# Patient Record
Sex: Female | Born: 1965 | Race: Black or African American | Hispanic: No | Marital: Married
Health system: Southern US, Community
[De-identification: ages and names within clinical notes are randomized; demographics above are authoritative.]

## PROBLEM LIST (undated history)

## (undated) ENCOUNTER — Emergency Department (HOSPITAL_COMMUNITY): Payer: No Typology Code available for payment source

## (undated) DIAGNOSIS — R232 Flushing: Secondary | ICD-10-CM

## (undated) DIAGNOSIS — F419 Anxiety disorder, unspecified: Secondary | ICD-10-CM

## (undated) DIAGNOSIS — Z8489 Family history of other specified conditions: Secondary | ICD-10-CM

## (undated) DIAGNOSIS — G709 Myoneural disorder, unspecified: Secondary | ICD-10-CM

## (undated) DIAGNOSIS — I10 Essential (primary) hypertension: Secondary | ICD-10-CM

## (undated) DIAGNOSIS — F32A Depression, unspecified: Secondary | ICD-10-CM

## (undated) DIAGNOSIS — E039 Hypothyroidism, unspecified: Secondary | ICD-10-CM

## (undated) DIAGNOSIS — Z1379 Encounter for other screening for genetic and chromosomal anomalies: Principal | ICD-10-CM

## (undated) DIAGNOSIS — C50911 Malignant neoplasm of unspecified site of right female breast: Secondary | ICD-10-CM

## (undated) DIAGNOSIS — M1990 Unspecified osteoarthritis, unspecified site: Secondary | ICD-10-CM

## (undated) DIAGNOSIS — IMO0001 Reserved for inherently not codable concepts without codable children: Secondary | ICD-10-CM

## (undated) DIAGNOSIS — F329 Major depressive disorder, single episode, unspecified: Secondary | ICD-10-CM

## (undated) DIAGNOSIS — M549 Dorsalgia, unspecified: Secondary | ICD-10-CM

## (undated) DIAGNOSIS — Z1509 Genetic susceptibility to other malignant neoplasm: Secondary | ICD-10-CM

## (undated) DIAGNOSIS — C50511 Malignant neoplasm of lower-outer quadrant of right female breast: Principal | ICD-10-CM

## (undated) DIAGNOSIS — R51 Headache: Secondary | ICD-10-CM

## (undated) DIAGNOSIS — R519 Headache, unspecified: Secondary | ICD-10-CM

## (undated) DIAGNOSIS — C50919 Malignant neoplasm of unspecified site of unspecified female breast: Secondary | ICD-10-CM

## (undated) DIAGNOSIS — Z828 Family history of other disabilities and chronic diseases leading to disablement, not elsewhere classified: Secondary | ICD-10-CM

## (undated) DIAGNOSIS — C719 Malignant neoplasm of brain, unspecified: Secondary | ICD-10-CM

## (undated) DIAGNOSIS — IMO0002 Reserved for concepts with insufficient information to code with codable children: Secondary | ICD-10-CM

## (undated) HISTORY — DX: Anxiety disorder, unspecified: F41.9

## (undated) HISTORY — DX: Unspecified osteoarthritis, unspecified site: M19.90

## (undated) HISTORY — DX: Major depressive disorder, single episode, unspecified: F32.9

## (undated) HISTORY — DX: Essential (primary) hypertension: I10

## (undated) HISTORY — DX: Malignant neoplasm of lower-outer quadrant of right female breast: C50.511

## (undated) HISTORY — DX: Flushing: R23.2

## (undated) HISTORY — DX: Depression, unspecified: F32.A

## (undated) HISTORY — DX: Malignant neoplasm of unspecified site of unspecified female breast: C50.919

## (undated) HISTORY — PX: BREAST REDUCTION SURGERY: SHX8

## (undated) HISTORY — DX: Encounter for other screening for genetic and chromosomal anomalies: Z13.79

## (undated) HISTORY — DX: Genetic susceptibility to other malignant neoplasm: Z15.09

## (undated) HISTORY — DX: Dorsalgia, unspecified: M54.9

## (undated) NOTE — *Deleted (*Deleted)
Patient Care Team: Patient, [name] as PCP - General (General Practice) Kaitlyn Miner, MD as Consulting Physician (General Surgery) Kaitlyn Croissant, MD as Consulting Physician (Hematology and Oncology) Antony Blackbird, MD as Consulting Physician (Radiation Oncology) Pershing Proud, RN as Registered Nurse Donnelly Angelica, RN as Registered Nurse Hubbard Hartshorn, NP (Inactive) as Nurse Practitioner (Nurse Practitioner)  DIAGNOSIS: No diagnosis found.  SUMMARY OF ONCOLOGIC HISTORY: Oncology History  Breast cancer of lower-outer quadrant of right female breast (HCC)  12/03/2014 Mammogram   Right breast mass 1.9 cm it o'clock position 8 cm depth from the nipple   12/03/2014 Initial Diagnosis   Right breast biopsy 8:00: Invasive ductal carcinoma, grade 3, ER 0%, PR 0%, Ki-67 90%, HER-2 negative ratio 1.43   12/10/2014 Breast MRI   Right breast lower outer quadrant: 2.3 x 2.4 x 2.4 cm rim-enhancing mass abuts the pectoralis fascia but no enhancement of pectoralis muscle, second focus of artifact?'s second tissue marker clip, no lymph nodes   12/10/2014 Clinical Stage   Stage IIA: T2 N0   12/24/2014 - 04/29/2015 Neo-Adjuvant Chemotherapy   Dose dense Adriamycin and Cytoxan 4 followed by weekly Abraxane 12   05/02/2015 Breast MRI   complete radiologic response   06/16/2015 Surgery   Left Lumpectomy: Complete path Response, 0/2 LN   06/16/2015 Pathologic Stage   ypT0 ypN0   07/23/2015 - 09/05/2015 Radiation Therapy   Adjuvant RT: 50.4 Gy in 28 fractions and a boost of 10 Gy in 5 fractions to total dose of 60.4 Gy   10/24/2015 Survivorship   SCP mailed to patient in lieu of in person visit.   07/26/2016 - 07/27/2016 Radiation Therapy    SRS brain   07/27/2016 - 07/29/2016 Hospital Admission   Cerebellar mass: Right suboccipital craniotomy for tumor resection with stereotactic navigation: Metastatic poorly differentiated adenocarcinoma with extensive necrosis positive for CK 7, MOC 31, CK  5/6; Neg for Er/PR, GATA-3, GCDFP CDX2, Napsin A and TTF-1   11/17/2016 PET scan   Subcutaneous nodules in the neck, upper back, left arm, abdomen and pelvis,. 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 in BARD 1, BRCA2 and NF1   12/31/2016 Miscellaneous   Everolimus 10 mg daily for cycle 1 if she cannot tolerate will decrease to 7.5 mg daily   05/24/2017 - 07/04/2017 Chemotherapy   Xeloda 2000 mg 2 weeks on 1 week off stopped due to progression of disease based on CT scans done 06/27/2017   07/25/2017 - 03/13/2018 Chemotherapy   [place] days 1 and 8 every 3 weeks stopped for progression    03/20/2018 - 03/31/2018 Radiation Therapy   Radiation to lymph nodes   04/03/2018 -  Chemotherapy   Keytruda every 3 weeks      CHIEF COMPLIANT: Follow-up of metastatic breast cancer on Keytruda  INTERVAL HISTORY: Kaitlyn Keith is a 22 y.o. with above-mentioned history of metastatic breast cancer currently on treatment with Keytruda. CT CAP on 03/14/20 showed no evidence of local recurrence or metastatic disease. She presents to the clinic today for treatment.    ALLERGIES:  has No Known Allergies.  MEDICATIONS:  Current Outpatient Medications  Medication Sig Dispense Refill  . alprazolam (XANAX) 2 MG tablet Take 1 tablet (  2 mg total) by mouth at bedtime. 30 tablet 3  . betamethasone valerate ointment (VALISONE) 0.1 % Apply 1 application topically 2 (two) times daily. 30 g 1  . cyclobenzaprine (FLEXERIL) 5 MG tablet Take 1 tablet (5 mg total) by mouth 3 (three) times daily as needed for muscle spasms. 30 tablet 0  . ipratropium (ATROVENT) 0.06 % nasal spray Place 2 sprays  into both nostrils 4 (four) times daily.    Marland Kitchen levothyroxine (SYNTHROID) 125 MCG tablet TAKE 1 TABLET BY MOUTH EVERY DAY BEFORE BREAKFAST 90 tablet 0  . lidocaine-prilocaine (EMLA) cream APPLY TO AFFECTED AREA ONCE AS DIRECTED 30 g 3  . lisinopril-hydrochlorothiazide (ZESTORETIC) 20-12.5 MG tablet TAKE 1 TABLET BY MOUTH TWICE A DAY 180 tablet 3  . triamcinolone ointment (KENALOG) 0.5 % APPLY TO AFFECTED AREA TWICE A DAY (Patient not taking: Reported on 02/07/2020) 30 g 0   No current facility-administered medications for this visit.    PHYSICAL EXAMINATION: ECOG PERFORMANCE STATUS: {CHL ONC ECOG PS:[phone]}  There were no vitals filed for this visit. There were no vitals filed for this visit.  LABORATORY DATA:  I have reviewed the data as listed CMP Latest Ref Rng & Units 02/29/2020 02/11/2020 01/22/2020  Glucose 70 - 99 mg/dL 960(A) 540(J) 79  BUN 6 - 20 mg/dL 10 14 12   Creatinine 0.44 - 1.00 mg/dL 8.11 9.14 7.82  Sodium 135 - 145 mmol/L 139 139 140  Potassium 3.5 - 5.1 mmol/L 3.4(L) 3.5 3.4(L)  Chloride 98 - 111 mmol/L 100 106 107  CO2 22 - 32 mmol/L 27 25 27   Calcium 8.9 - 10.3 mg/dL 9.5(A) 2.1(H) 9.7  Total Protein 6.5 - 8.1 g/dL 7.2 7.1 6.7  Total Bilirubin 0.3 - 1.2 mg/dL 0.7 0.5 0.5  Alkaline Phos 38 - 126 U/L 92 97 84  AST 15 - 41 U/L 17 16 19   ALT 0 - 44 U/L 13 11 11     Lab Results  Component Value Date   WBC 4.3 02/29/2020   HGB 12.9 02/29/2020   HCT 38.9 02/29/2020   MCV 87.2 02/29/2020   PLT 195 02/29/2020   NEUTROABS 2.8 02/29/2020    ASSESSMENT & PLAN:  No problem-specific Assessment & Plan notes found for this encounter.    No orders of the defined types were placed in this encounter.  The patient has a good understanding of the overall plan. she agrees with it. she will call with any problems that may develop before the next visit here.  Total time spent: *** mins including face to face time and time spent for planning, charting and coordination of  care  Kaitlyn Croissant, MD 03/16/2020  I, Kirt Boys Dorshimer, am acting as scribe for Dr. Serena Keith.  {insert scribe attestation}

---

## 2004-06-18 ENCOUNTER — Emergency Department (HOSPITAL_COMMUNITY): Admission: EM | Admit: 2004-06-18 | Discharge: 2004-06-18 | Payer: Self-pay | Admitting: Family Medicine

## 2005-03-13 ENCOUNTER — Emergency Department (HOSPITAL_COMMUNITY): Admission: EM | Admit: 2005-03-13 | Discharge: 2005-03-13 | Payer: Self-pay | Admitting: Emergency Medicine

## 2005-08-19 ENCOUNTER — Other Ambulatory Visit: Admission: RE | Admit: 2005-08-19 | Discharge: 2005-08-19 | Payer: Self-pay | Admitting: *Deleted

## 2005-09-07 ENCOUNTER — Ambulatory Visit: Payer: Self-pay | Admitting: Cardiology

## 2007-04-05 ENCOUNTER — Encounter: Admission: RE | Admit: 2007-04-05 | Discharge: 2007-04-05 | Payer: Self-pay | Admitting: *Deleted

## 2013-08-30 ENCOUNTER — Ambulatory Visit: Payer: Self-pay | Admitting: Podiatry

## 2014-12-05 ENCOUNTER — Telehealth: Payer: Self-pay | Admitting: *Deleted

## 2014-12-05 ENCOUNTER — Other Ambulatory Visit: Payer: Self-pay | Admitting: Radiology

## 2014-12-05 ENCOUNTER — Encounter: Payer: Self-pay | Admitting: *Deleted

## 2014-12-05 DIAGNOSIS — C50511 Malignant neoplasm of lower-outer quadrant of right female breast: Secondary | ICD-10-CM | POA: Insufficient documentation

## 2014-12-05 DIAGNOSIS — Z853 Personal history of malignant neoplasm of breast: Secondary | ICD-10-CM

## 2014-12-05 HISTORY — DX: Malignant neoplasm of lower-outer quadrant of right female breast: C50.511

## 2014-12-05 NOTE — Telephone Encounter (Signed)
Confirmed [facility] for 12/11/14 at 1200.  Instructions and contact information given.

## 2014-12-10 ENCOUNTER — Ambulatory Visit
Admission: RE | Admit: 2014-12-10 | Discharge: 2014-12-10 | Disposition: A | Discharge status: Home / Self Care | Payer: BLUE CROSS/BLUE SHIELD | Source: Ambulatory Visit | Attending: Radiology | Admitting: Radiology

## 2014-12-10 DIAGNOSIS — Z853 Personal history of malignant neoplasm of breast: Secondary | ICD-10-CM

## 2014-12-10 MED ORDER — GADOBENATE DIMEGLUMINE 529 MG/ML IV SOLN
16.0000 mL | Freq: Once | INTRAVENOUS | Status: AC | PRN
  Administered 2014-12-10: 16 mL via INTRAVENOUS

## 2014-12-11 ENCOUNTER — Other Ambulatory Visit (HOSPITAL_BASED_OUTPATIENT_CLINIC_OR_DEPARTMENT_OTHER): Payer: BLUE CROSS/BLUE SHIELD

## 2014-12-11 ENCOUNTER — Encounter: Payer: Self-pay | Admitting: General Practice

## 2014-12-11 ENCOUNTER — Encounter: Payer: Self-pay | Admitting: Physical Therapy

## 2014-12-11 ENCOUNTER — Ambulatory Visit (HOSPITAL_BASED_OUTPATIENT_CLINIC_OR_DEPARTMENT_OTHER): Payer: BLUE CROSS/BLUE SHIELD | Admitting: Hematology and Oncology

## 2014-12-11 ENCOUNTER — Ambulatory Visit: Payer: BLUE CROSS/BLUE SHIELD

## 2014-12-11 ENCOUNTER — Encounter: Payer: Self-pay | Admitting: Hematology and Oncology

## 2014-12-11 ENCOUNTER — Other Ambulatory Visit: Payer: Self-pay | Admitting: General Surgery

## 2014-12-11 ENCOUNTER — Encounter: Payer: Self-pay | Admitting: Adult Health

## 2014-12-11 ENCOUNTER — Ambulatory Visit
Admission: RE | Admit: 2014-12-11 | Discharge: 2014-12-11 | Disposition: A | Discharge status: Home / Self Care | Payer: BLUE CROSS/BLUE SHIELD | Source: Ambulatory Visit | Attending: Radiation Oncology | Admitting: Radiation Oncology

## 2014-12-11 ENCOUNTER — Ambulatory Visit: Payer: BLUE CROSS/BLUE SHIELD | Attending: General Surgery | Admitting: Physical Therapy

## 2014-12-11 ENCOUNTER — Telehealth: Payer: Self-pay | Admitting: Hematology and Oncology

## 2014-12-11 VITALS — BP 176/95 | HR 62 | Temp 98.2°F | Resp 18 | Ht 63.0 in | Wt 169.7 lb

## 2014-12-11 DIAGNOSIS — C50511 Malignant neoplasm of lower-outer quadrant of right female breast: Secondary | ICD-10-CM

## 2014-12-11 DIAGNOSIS — Z171 Estrogen receptor negative status [ER-]: Secondary | ICD-10-CM

## 2014-12-11 DIAGNOSIS — M25511 Pain in right shoulder: Secondary | ICD-10-CM

## 2014-12-11 DIAGNOSIS — C50911 Malignant neoplasm of unspecified site of right female breast: Secondary | ICD-10-CM | POA: Diagnosis not present

## 2014-12-11 LAB — COMPREHENSIVE METABOLIC PANEL (CC13)
ALT: 9 U/L (ref 0–55)
AST: 15 U/L (ref 5–34)
Albumin: 3.6 g/dL (ref 3.5–5.0)
Alkaline Phosphatase: 77 U/L (ref 40–150)
Anion Gap: 6 mEq/L (ref 3–11)
BUN: 9.1 mg/dL (ref 7.0–26.0)
CO2: 28 mEq/L (ref 22–29)
Calcium: 8.9 mg/dL (ref 8.4–10.4)
Chloride: 106 mEq/L (ref 98–109)
Creatinine: 0.7 mg/dL (ref 0.6–1.1)
EGFR: >90 mL/min/{1.73_m2} (ref >90)
Glucose: 82 mg/dl (ref 70–140)
Potassium: 3.9 mEq/L (ref 3.5–5.1)
Sodium: 140 mEq/L (ref 136–145)
Total Bilirubin: 0.59 mg/dL (ref 0.20–1.20)
Total Protein: 6.9 g/dL (ref 6.4–8.3)

## 2014-12-11 LAB — CBC WITH DIFFERENTIAL/PLATELET
BASO%: 0.2 % (ref 0.0–2.0)
Basophils Absolute: 0 10*3/uL (ref 0.0–0.1)
EOS%: 0.6 % (ref 0.0–7.0)
Eosinophils Absolute: 0 10*3/uL (ref 0.0–0.5)
HCT: 46.7 % — ABNORMAL HIGH (ref 34.8–46.6)
HGB: 13.9 g/dL (ref 11.6–15.9)
LYMPH%: 30 % (ref 14.0–49.7)
MCH: 30.3 pg (ref 25.1–34.0)
MCHC: 29.8 g/dL — ABNORMAL LOW (ref 31.5–36.0)
MCV: 102 fL — ABNORMAL HIGH (ref 79.5–101.0)
MONO#: 0.4 10*3/uL (ref 0.1–0.9)
MONO%: 8.1 % (ref 0.0–14.0)
NEUT#: 3 10*3/uL (ref 1.5–6.5)
NEUT%: 61.1 % (ref 38.4–76.8)
Platelets: 194 10*3/uL (ref 145–400)
RBC: 4.58 10*6/uL (ref 3.70–5.45)
RDW: 12.7 % (ref 11.2–14.5)
WBC: 4.8 10*3/uL (ref 3.9–10.3)
lymph#: 1.5 10*3/uL (ref 0.9–3.3)

## 2014-12-11 MED ORDER — LIDOCAINE-PRILOCAINE 2.5-2.5 % EX CREA: TOPICAL_CREAM | CUTANEOUS | Status: DC

## 2014-12-11 MED ORDER — PROCHLORPERAZINE MALEATE 10 MG PO TABS
10.0000 mg | ORAL_TABLET | Freq: Four times a day (QID) | ORAL | Status: DC | PRN
Start: 2014-12-11 — End: 2015-08-05

## 2014-12-11 MED ORDER — LORAZEPAM 0.5 MG PO TABS: 0.5000 mg | ORAL_TABLET | Freq: Four times a day (QID) | ORAL | Status: DC | PRN

## 2014-12-11 MED ORDER — DEXAMETHASONE 4 MG PO TABS: 4.0000 mg | ORAL_TABLET | Freq: Two times a day (BID) | ORAL | Status: DC

## 2014-12-11 MED ORDER — ONDANSETRON HCL 8 MG PO TABS
8.0000 mg | ORAL_TABLET | Freq: Two times a day (BID) | ORAL | Status: DC | PRN
Start: 2014-12-11 — End: 2015-02-25

## 2014-12-11 NOTE — Therapy (Signed)
[place], [place], [address] Phone: [phone]   Fax:  [phone]  Physical Therapy Evaluation  Patient Details  Name: Kaitlyn Keith MRN: [id number] Date of Birth: [birth year] Referring Provider:  [name], MD  Encounter Date: 12/11/2014      PT End of Session - 12/11/14 1623    Visit Number 1   Number of Visits 1   PT Start Time 1400   PT Stop Time 1425   PT Time Calculation (min) 25 min   Activity Tolerance Patient tolerated treatment well   Behavior During Therapy [facility] for tasks assessed/performed      Past Medical History  Diagnosis Date  . Breast cancer of lower-outer quadrant of right female breast 12/05/2014  . Breast cancer   . Hypertension   . Anxiety   . Depression   . Arthritis   . Hot flashes   . Back pain     Past Surgical History  Procedure Laterality Date  . Breast reduction surgery      There were no vitals filed for this visit.  Visit Diagnosis:  Breast cancer, right - Plan: PT plan of care cert/re-cert  Right shoulder pain - Plan: PT plan of care cert/re-cert      Subjective Assessment - 12/11/14 1624    Pertinent History Patient was diagnosed 12/03/14 with right triple negative grade 3 invasive ductal carcinoma measuring 2.4 cm on MRI.  The Ki67 is 90%.              [facility] PT Assessment - 12/11/14 0001    Assessment   Medical Diagnosis Right breast cancer   Onset Date/Surgical Date 12/03/14   Hand Dominance Right   Prior Therapy none   Precautions   Precautions Other (comment)  Active cancer   Restrictions   Weight Bearing Restrictions No   Balance Screen   Has the patient fallen in the past 6 months No   Has the patient had a decrease in activity level because of a fear of falling?  No   Is the patient reluctant to leave their home because of a fear of falling?  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   Cognition   Overall Cognitive Status Within Functional Limits for tasks assessed   Posture/Postural Control   Posture/Postural Control No significant limitations   ROM / Strength   AROM / PROM / Strength AROM;Strength   AROM   AROM Assessment Site Shoulder   Right/Left Shoulder Right;Left   Right Shoulder Extension 66 Degrees   Right Shoulder Flexion 136 Degrees  Painful end ROM   Right Shoulder ABduction 140 Degrees  Painful end ROM   Right Shoulder Internal Rotation 65 Degrees   Right Shoulder External Rotation 87 Degrees   Left Shoulder Extension 58 Degrees   Left Shoulder Flexion 142 Degrees   Left Shoulder ABduction 146 Degrees   Left Shoulder Internal Rotation 68 Degrees   Left Shoulder External Rotation 81 Degrees   Strength   Overall Strength Within functional limits for tasks performed           LYMPHEDEMA/ONCOLOGY QUESTIONNAIRE - 12/11/14 1621    Type  Cancer Type Right breast cancer   Lymphedema Assessments   Lymphedema Assessments Upper extremities   Right Upper Extremity Lymphedema   10 cm Proximal to Olecranon Process 31.9 cm   Olecranon Process 25.5 cm   10 cm Proximal to Ulnar Styloid Process 23.1 cm   Just Proximal to Ulnar Styloid Process 15.5 cm   Across Hand at [employer] 19.8 cm   At [place] of 2nd Digit 6.1 cm   Left Upper Extremity Lymphedema   10 cm Proximal to Olecranon Process 32 cm   Olecranon Process 25.6 cm   10 cm Proximal to Ulnar Styloid Process 22.3 cm   Just Proximal to Ulnar Styloid Process 15.9 cm   Across Hand at [employer] 19.5 cm   At [place] of 2nd Digit 6.1 cm       Patient was instructed today in a home exercise program today for post op shoulder range of  motion. These included active assist shoulder flexion in sitting, scapular retraction, wall walking with shoulder abduction, and hands behind head external rotation.  She was encouraged to do these twice a day, holding 3 seconds and repeating 5 times when permitted by her physician.           PT Education - 12/11/14 1622    Education provided Yes   Education Details Post op shoulder ROM HEP and lymphedema risk reduction   Person(s) Educated Patient;Other (comment)  3 sisters   Methods Explanation;Demonstration;Handout   Comprehension Verbalized understanding;Returned demonstration              Breast Clinic Goals - 12/11/14 1626    Patient will be able to verbalize understanding of pertinent lymphedema risk reduction practices relevant to her diagnosis specifically related to skin care.   Time 1   Period Days   Status Achieved   Patient will be able to return demonstrate and/or verbalize understanding of the post-op home exercise program related to regaining shoulder range of motion.   Time 1   Period Days   Status Achieved   Patient will be able to verbalize understanding of the importance of attending the postoperative After Breast Cancer Class for further lymphedema risk reduction education and therapeutic exercise.   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  The Ki67 is 90%.  She is planning to have neoadjuvant chemotherapy followed by a right lumpectomy and sentinel node biopsy and radiation.  She will benefit from post op PT to regain shoulder ROM and strength and reduce lymphedema risk.   Pt will benefit from skilled therapeutic intervention in order to improve on the following deficits Decreased range of motion;Pain;Decreased strength;Decreased knowledge of precautions;Impaired UE  functional use   Rehab Potential Excellent   Clinical Impairments Affecting Rehab Potential none   PT Frequency One time visit   PT Treatment/Interventions Patient/family education;Therapeutic exercise   Consulted and Agree with Plan of Care Patient;Family member/caregiver   Family Member Consulted 3 sisters       Patient will follow up at outpatient cancer rehab if needed following surgery.  If the patient requires physical therapy at that time, a specific plan will be dictated and sent to the referring physician for approval. The patient was educated today on appropriate basic  range of motion exercises to begin post operatively and the importance of attending the After Breast Cancer class following surgery.  Patient was educated today on lymphedema risk reduction practices as it pertains to recommendations that will benefit the patient immediately following surgery.  She verbalized good understanding.  No additional physical therapy is indicated at this time.      Problem List Patient Active Problem List   Diagnosis Date Noted  . Breast cancer of lower-outer quadrant of right female breast 12/05/2014    Annia Friendly, PT 12/11/2014, 4:27 PM  [place], [place], [address] Phone: [phone]   Fax:  [phone]

## 2014-12-11 NOTE — Progress Notes (Signed)
[name], [name], [name], [name], [name], [name], [name], [name] Patient felt a lump in the right breast and then undergone her first ever mammogram. The mammogram did not reveal any abnormality this was evaluated by ultrasound was found to have 1.9 cm mass deep in the breast. This could not be found on the mammogram. By breast MRI the mass measured 2.4 cm in size. After the biopsy she is little tender in the breast. Sadly she was divorced recently in March. She is single mother hoping to go to complete bachelors course. I reviewed her records extensively and collaborated the history with the patient.  SUMMARY OF ONCOLOGIC HISTORY:   Breast cancer of lower-outer quadrant of right female breast   12/03/2014 Mammogram Right breast mass 1.9 cm it o'clock position 8 cm depth from the nipple   12/03/2014 Initial Diagnosis Right breast biopsy 8:00: Invasive ductal carcinoma, grade 3, ER 0%, PR 0%, Ki-67 90%, HER-2 negative ratio 1.43   12/10/2014 Breast MRI Right breast lower outer quadrant: 2.3 x 2.4 x 2.4 cm rim-enhancing mass abuts the pectoralis fascia but no enhancement of pectoralis muscle, second focus of artifact?'s second tissue marker clip, no lymph nodes    In terms of breast cancer risk profile:  She menarched at early age of 87 and is premenopausal  She had one pregnancy, her first child was born at age 71   She has not received birth control pills.  She was never exposed to fertility medications or hormone replacement therapy.  She has no family history of Breast/GYN/GI cancer  MEDICAL HISTORY:  Past Medical History  Diagnosis Date  . Breast cancer of lower-outer quadrant of right female breast 12/05/2014  . Breast cancer   . Hypertension   . Anxiety   . Depression   . Arthritis   . Hot flashes   . Back pain     SURGICAL HISTORY: Past Surgical History  Procedure Laterality Date  . Breast reduction surgery      SOCIAL HISTORY: History   Social History  . Marital Status: Married    Spouse Name: N/A  . Number of Children: N/A  . Years of Education: N/A   Occupational History  . Not on file.   Social History Main Topics  . Smoking status: Never Smoker   . Smokeless tobacco: Not on file  . Alcohol Use: Yes  . Drug Use: No  . Sexual Activity: Not on file   Other Topics Concern  . Not on file   Social History Narrative    FAMILY HISTORY: History reviewed. No pertinent family history.  ALLERGIES:  has No Known Allergies.  MEDICATIONS:  Current Outpatient Prescriptions  Medication Sig Dispense Refill  . ALPRAZolam (XANAX) 1 MG tablet     . lisinopril-hydrochlorothiazide (PRINZIDE,ZESTORETIC) 20-12.5 MG per tablet     . sertraline (ZOLOFT) 50 MG tablet      No current facility-administered medications for this visit.    REVIEW OF SYSTEMS:   Constitutional:  Denies fevers, chills or abnormal night sweats Eyes: Denies blurriness of vision, double vision or watery eyes Ears, nose, mouth, throat, and face: Denies mucositis or sore throat Respiratory: Denies cough, dyspnea or wheezes Cardiovascular: Denies palpitation, chest discomfort or lower extremity swelling Gastrointestinal:  Denies nausea, heartburn or change in bowel habits Skin: Denies abnormal skin rashes Lymphatics: Denies new lymphadenopathy or easy bruising Neurological:Denies numbness, tingling or  new weaknesses Behavioral/Psych: Mood is stable, no new changes  Breast:  Palpable lump in the right breast All other systems were reviewed with the patient and are negative.  PHYSICAL EXAMINATION: ECOG PERFORMANCE STATUS: 1 - Symptomatic but completely ambulatory  Filed Vitals:   12/11/14 1238  BP: 176/95  Pulse: 62  Temp: 98.2 F (36.8 C)  Resp: 18   Filed Weights   12/11/14 1238  Weight: 169 lb 11.2 oz (76.975 kg)    GENERAL:alert, no distress and comfortable SKIN: skin color, texture, turgor are normal, no rashes or significant lesions EYES: normal, conjunctiva are pink and non-injected, sclera clear OROPHARYNX:no exudate, no erythema and lips, buccal mucosa, and tongue normal  NECK: supple, thyroid normal size, non-tender, without nodularity LYMPH:  no palpable lymphadenopathy in the cervical, axillary or inguinal LUNGS: clear to auscultation and percussion with normal breathing effort HEART: regular rate & rhythm and no murmurs and no lower extremity edema ABDOMEN:abdomen soft, non-tender and normal bowel sounds Musculoskeletal:no cyanosis of digits and no clubbing  PSYCH: alert & oriented x 3 with fluent speech NEURO: no focal motor/sensory deficits BREAST: Palpable nodule in the right breast. No palpable axillary or supraclavicular lymphadenopathy (exam performed in the presence of a chaperone)   LABORATORY DATA:  I have reviewed the data as listed Lab Results  Component Value Date   WBC 4.8 12/11/2014   HGB 13.9 12/11/2014   HCT 46.7* 12/11/2014   MCV 102.0* 12/11/2014   PLT 194 12/11/2014   Lab Results  Component Value Date   NA 140 12/11/2014   K 3.9 12/11/2014   CO2 28 12/11/2014    RADIOGRAPHIC STUDIES: I have personally reviewed the radiological reports and agreed with the findings in the report. Results summarized as below  ASSESSMENT AND PLAN:  Breast cancer of lower-outer quadrant of right female breast Right breast biopsy 12/03/2014 8:00:  Invasive ductal carcinoma, grade 3, ER 0%, PR 0%, 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  Pathology radiology review:Discussed with the patient, the details of pathology including the type of breast cancer,the clinical staging, the significance of ER, PR and HER-2/neu receptors and the implications for treatment. After reviewing the pathology in detail, we proceeded to discuss the different treatment options between surgery, radiation, chemotherapy, antiestrogen therapies.  Recommendation based on [place] tumor board: 1. Neo-adjuvant chemotherapy with dose dense Adriamycin and Cytoxan 4 followed by Abraxane and carboplatin once a week 12 2. Followed by repeat breast MRI followed by surgery 3. Followed by radiation  Chemotherapy counseling:I have discussed the risks and benefits of chemotherapy including the risks of nausea/ vomiting, risk of infection from low WBC count, fatigue due to chemo or anemia, bruising or bleeding due to low platelets, mouth sores, loss/ change in taste and decreased appetite. Liver and kidney function will be monitored through out chemotherapy as abnormalities in liver and kidney function may be a side effect of treatment. Cardiac dysfunction due to Adriamycin was  discussed in detail. Risk of permanent bone marrow dysfunction and leukemia due to chemo were also discussed.  Plan: 1. Port placement 2. Echocardiogram 3. Chemotherapy class 4. Start treatment 12/24/2014  Return to clinic August 2 to start chemotherapy    All questions were answered. The patient knows to call the clinic with any problems, questions or concerns.    Rulon Eisenmenger, MD 3:39 PM

## 2014-12-11 NOTE — Progress Notes (Signed)
[place] Psychosocial Distress Screening Spiritual Care  Met with Kaitlyn Keith and her three sisters (all local) to introduce Spiritual Care and [name] team/resources, reviewing distress screen per protocol.  The patient scored a 8 on the Psychosocial Distress Thermometer which indicates severe distress. Also assessed for distress and other psychosocial needs.   ONCBCN DISTRESS SCREENING 12/11/2014  Screening Type Initial Screening  Distress experienced in past week (1-10) 8  Practical problem type Work/school  Emotional problem type Depression;Nervousness/Anxiety  Information Concerns Type Lack of info about diagnosis;Lack of info about treatment;Lack of info about complementary therapy choices;Lack of info about maintaining fitness  Physical Problem type Sleep/insomnia;Breathing  Referral to support programs Yes  Other [name] reports good support from her three sisters and church community.  Per pt, faith is a key coping tool.  Per pt, [facility] was very helpful in terms of care, information, and plan, reducing her distress from 8 to 3-4.  She states that insomnia is fairly typical for her and that depression is "sometimes typical"; these were present prior to breast ca dx.  Provided pastoral presence, reflective listening, and introduction to services.  Pt and family verbalized gratitude for info and support, and are aware of ongoing chaplain availability.  Follow up needed: Yes.  Plan to send handwritten note of encouragement.  Please also page as needs arise.  [place], [place] Pager:  [phone] Voicemail:  [phone]

## 2014-12-11 NOTE — Patient Instructions (Signed)

## 2014-12-11 NOTE — Telephone Encounter (Signed)
Gave avs & calendar for July. Sent message to schedule echo.

## 2014-12-11 NOTE — Assessment & Plan Note (Addendum)
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  Pathology radiology review:Discussed with the patient, the details of pathology including the type of breast cancer,the clinical staging, the significance of ER, PR and HER-2/neu receptors and the implications for treatment. After reviewing the pathology in detail, we proceeded to discuss the different treatment options between surgery, radiation, chemotherapy, antiestrogen therapies.  Recommendation based on [place] tumor board: 1. Neo-adjuvant chemotherapy with dose dense Adriamycin and Cytoxan 4 followed by Abraxane and carboplatin once a week 12 2. Followed by repeat breast MRI followed by surgery 3. Followed by radiation  Chemotherapy counseling:I have discussed the risks and benefits of chemotherapy including the risks of nausea/ vomiting, risk of infection from low WBC count, fatigue due to chemo or anemia, bruising or bleeding due to low platelets, mouth sores, loss/ change in taste and decreased appetite. Liver and kidney function will be monitored through out chemotherapy as abnormalities in liver and kidney function may be a side effect of treatment. Cardiac dysfunction due to Adriamycin was discussed in detail. Risk of permanent bone marrow dysfunction and leukemia due to chemo were also discussed.  Plan: 1. Port placement 2. Echocardiogram 3. Chemotherapy class 4. Start treatment 12/24/2014  Return to clinic August 2 to start chemotherapy

## 2014-12-11 NOTE — Progress Notes (Signed)
Checked in new pt with no financial concerns prior to seeing the dr. Informed pt if chemo is part of her treatment we will call her insurance to see if Josem Kaufmann is required and will obtain it if it is as well as contact foundations that offer copay assistance for chemo if needed. She has my card for any billing question or concerns.

## 2014-12-11 NOTE — Progress Notes (Signed)
[name] is a very pleasant 49 y.o. female from [place], [place] with newly diagnosed grade 3 invasive ductal carcinoma of the right breast.  Biopsy results revealed the tumor's prognostic profile is ER negative, PR negative, and HER2/neu negative. Ki67 is 90%.  She presents today with her 3 sisters to the [facility]) for treatment consideration and recommendations from the breast surgeon, radiation oncologist, and medical oncologist.     I briefly met with [name] and her sisters during her [facility] visit today. We discussed the purpose of the Survivorship Clinic, which will include monitoring for recurrence, coordinating completion of age and gender-appropriate cancer screenings, promotion of overall wellness, as well as managing potential late/long-term side effects of anti-cancer treatments.    The treatment plan for [name] will likely include neoadjuvant chemotherapy, surgery, and radiation therapy.  She will meet with the Genetics Counselor due to her age at diagnosis and triple negative breast cancer. As of today, the intent of treatment for [name] is cure, therefore she will be eligible for the Survivorship Clinic upon her completion of treatment.  Her survivorship care plan (SCP) document will be drafted and updated throughout the course of her treatment trajectory. She will receive the SCP in an office visit with myself in the Survivorship Clinic once she has completed treatment.   [name] was encouraged to ask questions and all questions were answered to her satisfaction.  She was given my business card and encouraged to contact me with any concerns regarding survivorship.  I look forward to participating in her care.   Mike Craze, NP [place] [phone]

## 2014-12-11 NOTE — Progress Notes (Signed)
Note created during office visit. Copy to patient, original to scan.

## 2014-12-11 NOTE — Progress Notes (Signed)
Radiation Oncology         [phone] ________________________________  Initial Outpatient Consultation  Name: Kaitlyn Keith MRN: [id number]  Date: 12/11/2014  DOB: 06/20/1965  CC:Duran, Michael, PA-C  Toth, Paul III, MD   REFERRING PHYSICIAN: Toth, Paul III, MD  DIAGNOSIS: The encounter diagnosis was Breast cancer of lower-outer quadrant of right female breast. Grade 3 Invasive Ductal Carcinoma of the Right Breast. Clinical T2, triple negative with a Ki67 of 90%.  HISTORY OF PRESENT ILLNESS::Kaitlyn Keith is a 48 y.o. female who is here at the courtesy of Dr. Toth with recent diagnosis of right-sided breast cancer. Patient felt a lump in the right breast and then undergone her first ever mammogram. The mammogram did not reveal any abnormality this was evaluated by ultrasound was found to have 1.9 cm mass deep in the breast.  By breast MRI the mass measured 2.4 cm in size. After the biopsy she is little tender in the breast. divorced recently in March accompanied by 3 sisters on evaluation today. Triple negative with a Ki67 of 90%.   PREVIOUS RADIATION THERAPY: No  PAST MEDICAL HISTORY:  has a past medical history of Breast cancer of lower-outer quadrant of right female breast (12/05/2014); Breast cancer; Hypertension; Anxiety; Depression; Arthritis; Hot flashes; and Back pain.    PAST SURGICAL HISTORY: Past Surgical History  Procedure Laterality Date  . Breast reduction surgery      FAMILY HISTORY: family history is not on file.  SOCIAL HISTORY:  reports that she has never smoked. She does not have any smokeless tobacco history on file. She reports that she drinks alcohol. She reports that she does not use illicit drugs.  ALLERGIES: Review of patient's allergies indicates no known allergies.  MEDICATIONS:  Current Outpatient Prescriptions  Medication Sig Dispense Refill  . ALPRAZolam (XANAX) 1 MG tablet     . dexamethasone (DECADRON) 4 MG tablet Take 1 tablet (4 mg  total) by mouth 2 (two) times daily. Take 2 tablets by mouth once a day on the day after chemotherapy and then take 2 tablets two times a day for 2 days. Take with food. 30 tablet 1  . lidocaine-prilocaine (EMLA) cream Apply to affected area once 30 g 3  . lisinopril-hydrochlorothiazide (PRINZIDE,ZESTORETIC) 20-12.5 MG per tablet     . LORazepam (ATIVAN) 0.5 MG tablet Take 1 tablet (0.5 mg total) by mouth every 6 (six) hours as needed (Nausea or vomiting). 30 tablet 0  . ondansetron (ZOFRAN) 8 MG tablet Take 1 tablet (8 mg total) by mouth 2 (two) times daily as needed. Start on the third day after chemotherapy. 30 tablet 1  . prochlorperazine (COMPAZINE) 10 MG tablet Take 1 tablet (10 mg total) by mouth every 6 (six) hours as needed (Nausea or vomiting). 30 tablet 1  . sertraline (ZOLOFT) 50 MG tablet      No current facility-administered medications for this encounter.    REVIEW OF SYSTEMS:  A 15 point review of systems is documented in the electronic medical record. This was obtained by the nursing staff. However, I reviewed this with the patient to discuss relevant findings and make appropriate changes.  Palpated lump, no breast pain, nipple discharge or bleeding.  Gynecologic History  Age at first menstrual period? 16  Are you still having periods? Yes Approximate date of last period? November 22, 2014  If you are still having periods: Are your periods regular? Yes Obstetric History:  How many children have you carried to term? 1 Your age   at first live birth? 18  Pregnant now or trying to get pregnant? No  Have you used birth control pills or hormone shots for contraception? No Health Maintenance:  Have you ever had a colonoscopy? No  Have you ever had a bone density? No  Date of your last PAP smear? 1-5 years Date of your FIRST mammogram? 34  PHYSICAL EXAM:  Vitals - 1 value per visit 12/11/2014  SYSTOLIC 176  DIASTOLIC 95  Pulse 62  Temperature 98.2  Respirations 18  Weight (lb)  169.7  Height 5' 3"  BMI 30.07  VISIT REPORT     No palpable cervical, supraclavicular, or axillary adenopathy. Left breast shows scars from breast reduction. No palpable mass or nipple discharge. Right breast shows scars in the inferior aspect of the breast from prior breast reduction. A palpable mass in the right breast lower outer quadrant measures 3x4 cm. It is mobile not fixed to the chest wall or skin. Associated bruising from a recent biopsy.  ECOG = 1   LABORATORY DATA:  Lab Results  Component Value Date   WBC 4.8 12/11/2014   HGB 13.9 12/11/2014   HCT 46.7* 12/11/2014   MCV 102.0* 12/11/2014   PLT 194 12/11/2014   NEUTROABS 3.0 12/11/2014   Lab Results  Component Value Date   NA 140 12/11/2014   K 3.9 12/11/2014   CO2 28 12/11/2014   GLUCOSE 82 12/11/2014   CREATININE 0.7 12/11/2014   CALCIUM 8.9 12/11/2014      RADIOGRAPHY: Mr Breast Bilateral W Wo Contrast  12/10/2014   CLINICAL DATA:  Right breast invasive carcinoma. Cancer of the lower-outer quadrant right female breast.  LABS:  BUN and creatinine were obtained on site at [facility] at  [address]  Results:  BUN 10 mg/dL,  Creatinine 0.7 mg/dL.  EXAM: BILATERAL BREAST MRI WITH AND WITHOUT CONTRAST  TECHNIQUE: Multiplanar, multisequence MR images of both breasts were obtained prior to and following the intravenous administration of 16 ml of MultiHance.  THREE-DIMENSIONAL MR IMAGE RENDERING ON INDEPENDENT WORKSTATION:  Three-dimensional MR images were rendered by post-processing of the original MR data on an independent workstation. The three-dimensional MR images were interpreted, and findings are reported in the following complete MRI report for this study. Three dimensional images were evaluated at the independent DynaCad workstation  COMPARISON:  Previous exam(s).  FINDINGS: Breast composition: c. Heterogeneous fibroglandular tissue.  Background parenchymal enhancement: b. Scattered fibroglandular  tissue.  Right breast: Within the lower outer quadrant of the right breast there is a 2.3 x 2.4 x 2.4 cm rim enhancing mass. Mass demonstrates rapid wash-in and washout type enhancement kinetics. The lesion extends to abut the pectoralis fascia, but there is no enhancement of the pectoralis muscle. Centrally within the mass there is tissue marker clip artifact. More superficially located in the same quadrant of the right breast there is a second focus of artifact raising the question of a second tissue marker clip 1.0 cm deep to the skin surface. Per previous report the tissue marker clip was not able to be demonstrated following ultrasound-guided core biopsy given the location of the mass.  Left breast: No mass or abnormal enhancement.  Lymph nodes: No abnormal appearing lymph nodes.  Ancillary findings:  None.  IMPRESSION: 1. 2.4 cm mass within the lower outer posterior portion of the right breast, consistent with known malignancy. 2. No MRI evidence for pectoralis involvement. 3. Left breast is negative by MRI.  RECOMMENDATION: Treatment plan for known   malignancy.  BI-RADS CATEGORY  6: Known biopsy-proven malignancy.   Electronically Signed   By: Elizabeth  Brown M.D.   On: 12/10/2014 11:54      IMPRESSION: Grade 3 Invasive Ductal Carcinoma of the Right Breast. Good candidate for breast conservation therapy. Plan is neoadjuvant to improve chances for clear margins.   PLAN:  Recommendation based on [facility] tumor board: 1. Neo-adjuvant chemotherapy with dose dense Adriamycin and Cytoxan 4 followed by Abraxane and carboplatin once a week 12 2. Followed by repeat breast MRI followed by surgery 3. Followed by radiation  She met with surgery, medical oncology as well as a member of our patient family support team, a dietitian, our survivor navigator, and our physical therapist.      ------------------------------------------------  James D. Kinard, PhD, MD 

## 2014-12-13 ENCOUNTER — Telehealth: Payer: Self-pay

## 2014-12-13 ENCOUNTER — Telehealth: Payer: Self-pay | Admitting: *Deleted

## 2014-12-13 NOTE — Telephone Encounter (Signed)
DEXAMETHASONE DIRECTIONS CLARIFIED.

## 2014-12-13 NOTE — Telephone Encounter (Signed)
Prescription for dexamethasone and ativan faxed to [place].  Sent to scan.

## 2014-12-16 ENCOUNTER — Telehealth: Payer: Self-pay | Admitting: Hematology and Oncology

## 2014-12-16 NOTE — Telephone Encounter (Signed)
Confirmed appointment for ECHO on 07/29

## 2014-12-17 ENCOUNTER — Encounter (HOSPITAL_BASED_OUTPATIENT_CLINIC_OR_DEPARTMENT_OTHER): Payer: Self-pay | Admitting: *Deleted

## 2014-12-19 ENCOUNTER — Telehealth: Payer: Self-pay | Admitting: *Deleted

## 2014-12-19 NOTE — Telephone Encounter (Signed)
Spoke to pt concerning Kaitlyn Keith from 12/11/14. Denies questions or concerns regarding dx or treatment care plan. Scheduled and confirmed echo, lab, f/u appt with Dr. Lindi Adie and 1st chemo. Ensured pt was able to get her prescriptions prior to 1st chemo. Encourage pt to call with needs. Received verbal understanding. Placed referral for SW and FC.

## 2014-12-20 ENCOUNTER — Ambulatory Visit (HOSPITAL_COMMUNITY): Payer: BLUE CROSS/BLUE SHIELD

## 2014-12-20 ENCOUNTER — Other Ambulatory Visit: Payer: BLUE CROSS/BLUE SHIELD

## 2014-12-20 ENCOUNTER — Encounter: Payer: Self-pay | Admitting: *Deleted

## 2014-12-23 ENCOUNTER — Ambulatory Visit (HOSPITAL_BASED_OUTPATIENT_CLINIC_OR_DEPARTMENT_OTHER): Payer: BLUE CROSS/BLUE SHIELD | Admitting: Anesthesiology

## 2014-12-23 ENCOUNTER — Encounter (HOSPITAL_BASED_OUTPATIENT_CLINIC_OR_DEPARTMENT_OTHER): Payer: Self-pay

## 2014-12-23 ENCOUNTER — Ambulatory Visit (HOSPITAL_COMMUNITY): Admission: RE | Admit: 2014-12-23 | Payer: BLUE CROSS/BLUE SHIELD | Source: Ambulatory Visit

## 2014-12-23 ENCOUNTER — Ambulatory Visit (HOSPITAL_COMMUNITY): Payer: BLUE CROSS/BLUE SHIELD

## 2014-12-23 ENCOUNTER — Encounter (HOSPITAL_BASED_OUTPATIENT_CLINIC_OR_DEPARTMENT_OTHER)
Admission: RE | Disposition: A | Discharge status: Home / Self Care | Payer: Self-pay | Source: Ambulatory Visit | Attending: General Surgery

## 2014-12-23 ENCOUNTER — Ambulatory Visit (HOSPITAL_BASED_OUTPATIENT_CLINIC_OR_DEPARTMENT_OTHER)
Admission: RE | Admit: 2014-12-23 | Discharge: 2014-12-23 | Disposition: A | Discharge status: Home / Self Care | Payer: BLUE CROSS/BLUE SHIELD | Source: Ambulatory Visit | Attending: General Surgery | Admitting: General Surgery

## 2014-12-23 DIAGNOSIS — F418 Other specified anxiety disorders: Secondary | ICD-10-CM | POA: Insufficient documentation

## 2014-12-23 DIAGNOSIS — C50911 Malignant neoplasm of unspecified site of right female breast: Secondary | ICD-10-CM | POA: Diagnosis present

## 2014-12-23 DIAGNOSIS — Z95828 Presence of other vascular implants and grafts: Secondary | ICD-10-CM

## 2014-12-23 DIAGNOSIS — C50511 Malignant neoplasm of lower-outer quadrant of right female breast: Secondary | ICD-10-CM | POA: Diagnosis not present

## 2014-12-23 DIAGNOSIS — M1990 Unspecified osteoarthritis, unspecified site: Secondary | ICD-10-CM | POA: Diagnosis not present

## 2014-12-23 HISTORY — PX: PORTACATH PLACEMENT: SHX2246

## 2014-12-23 LAB — POCT HEMOGLOBIN-HEMACUE: Hemoglobin: 13.6 g/dL (ref 12.0–15.0)

## 2014-12-23 SURGERY — INSERTION, TUNNELED CENTRAL VENOUS DEVICE, WITH PORT
Anesthesia: General

## 2014-12-23 MED ORDER — FENTANYL CITRATE (PF) 100 MCG/2ML IJ SOLN
INTRAMUSCULAR | Status: DC | PRN
  Administered 2014-12-23: 25 ug via INTRAVENOUS

## 2014-12-23 MED ORDER — HEPARIN SOD (PORK) LOCK FLUSH 100 UNIT/ML IV SOLN
INTRAVENOUS | Status: AC
  Filled 2014-12-23: qty 5

## 2014-12-23 MED ORDER — HEPARIN (PORCINE) IN NACL 2-0.9 UNIT/ML-% IJ SOLN
INTRAMUSCULAR | Status: DC | PRN
  Administered 2014-12-23: 1 via INTRAVENOUS

## 2014-12-23 MED ORDER — CEFAZOLIN SODIUM-DEXTROSE 2-3 GM-% IV SOLR
2.0000 g | INTRAVENOUS | Status: AC
  Administered 2014-12-23: 2 g via INTRAVENOUS

## 2014-12-23 MED ORDER — ONDANSETRON HCL 4 MG/2ML IJ SOLN
INTRAMUSCULAR | Status: DC | PRN
  Administered 2014-12-23: 4 mg via INTRAVENOUS

## 2014-12-23 MED ORDER — OXYCODONE HCL 5 MG/5ML PO SOLN: 5.0000 mg | Freq: Once | ORAL | Status: AC | PRN

## 2014-12-23 MED ORDER — OXYCODONE HCL 5 MG PO TABS
ORAL_TABLET | ORAL | Status: AC
  Filled 2014-12-23: qty 1

## 2014-12-23 MED ORDER — CEFAZOLIN SODIUM-DEXTROSE 2-3 GM-% IV SOLR
INTRAVENOUS | Status: AC
  Filled 2014-12-23: qty 50

## 2014-12-23 MED ORDER — FENTANYL CITRATE (PF) 100 MCG/2ML IJ SOLN: 50.0000 ug | INTRAMUSCULAR | Status: DC | PRN

## 2014-12-23 MED ORDER — EPHEDRINE SULFATE 50 MG/ML IJ SOLN
INTRAMUSCULAR | Status: DC | PRN
  Administered 2014-12-23: 10 mg via INTRAVENOUS

## 2014-12-23 MED ORDER — HYDROMORPHONE HCL 1 MG/ML IJ SOLN
0.2500 mg | INTRAMUSCULAR | Status: DC | PRN
  Administered 2014-12-23: 0.25 mg via INTRAVENOUS

## 2014-12-23 MED ORDER — MIDAZOLAM HCL 2 MG/2ML IJ SOLN
1.0000 mg | INTRAMUSCULAR | Status: DC | PRN
Start: 2014-12-23 — End: 2014-12-23

## 2014-12-23 MED ORDER — HYDROMORPHONE HCL 1 MG/ML IJ SOLN
INTRAMUSCULAR | Status: AC
  Filled 2014-12-23: qty 1

## 2014-12-23 MED ORDER — LACTATED RINGERS IV SOLN
INTRAVENOUS | Status: DC
  Administered 2014-12-23: 13:00:00 via INTRAVENOUS

## 2014-12-23 MED ORDER — HEPARIN SOD (PORK) LOCK FLUSH 100 UNIT/ML IV SOLN
INTRAVENOUS | Status: DC | PRN
  Administered 2014-12-23: 500 [IU] via INTRAVENOUS

## 2014-12-23 MED ORDER — MIDAZOLAM HCL 5 MG/5ML IJ SOLN
INTRAMUSCULAR | Status: DC | PRN
  Administered 2014-12-23: 2 mg via INTRAVENOUS

## 2014-12-23 MED ORDER — GLYCOPYRROLATE 0.2 MG/ML IJ SOLN
0.2000 mg | Freq: Once | INTRAMUSCULAR | Status: AC | PRN
  Administered 2014-12-23: 0.2 mg via INTRAVENOUS

## 2014-12-23 MED ORDER — LIDOCAINE HCL (CARDIAC) 20 MG/ML IV SOLN
INTRAVENOUS | Status: DC | PRN
  Administered 2014-12-23: 50 mg via INTRAVENOUS

## 2014-12-23 MED ORDER — BUPIVACAINE-EPINEPHRINE (PF) 0.25% -1:200000 IJ SOLN
INTRAMUSCULAR | Status: DC | PRN
  Administered 2014-12-23: 8 mL

## 2014-12-23 MED ORDER — PROPOFOL 10 MG/ML IV BOLUS
INTRAVENOUS | Status: DC | PRN
  Administered 2014-12-23: 200 mg via INTRAVENOUS

## 2014-12-23 MED ORDER — OXYCODONE HCL 5 MG PO TABS
5.0000 mg | ORAL_TABLET | Freq: Once | ORAL | Status: AC | PRN
  Administered 2014-12-23: 5 mg via ORAL

## 2014-12-23 MED ORDER — FENTANYL CITRATE (PF) 100 MCG/2ML IJ SOLN
INTRAMUSCULAR | Status: AC
  Filled 2014-12-23: qty 6

## 2014-12-23 MED ORDER — SCOPOLAMINE 1 MG/3DAYS TD PT72: 1.0000 | MEDICATED_PATCH | Freq: Once | TRANSDERMAL | Status: DC | PRN

## 2014-12-23 MED ORDER — MIDAZOLAM HCL 2 MG/2ML IJ SOLN
INTRAMUSCULAR | Status: AC
  Filled 2014-12-23: qty 2

## 2014-12-23 MED ORDER — HEPARIN (PORCINE) IN NACL 2-0.9 UNIT/ML-% IJ SOLN
INTRAMUSCULAR | Status: AC
  Filled 2014-12-23: qty 500

## 2014-12-23 MED ORDER — PROMETHAZINE HCL 25 MG/ML IJ SOLN: 6.2500 mg | INTRAMUSCULAR | Status: DC | PRN

## 2014-12-23 MED ORDER — CHLORHEXIDINE GLUCONATE 4 % EX LIQD: 1.0000 "application " | Freq: Once | CUTANEOUS | Status: DC

## 2014-12-23 MED ORDER — DEXAMETHASONE SODIUM PHOSPHATE 4 MG/ML IJ SOLN
INTRAMUSCULAR | Status: DC | PRN
  Administered 2014-12-23: 10 mg via INTRAVENOUS

## 2014-12-23 MED ORDER — OXYCODONE-ACETAMINOPHEN 5-325 MG PO TABS: 1.0000 | ORAL_TABLET | ORAL | Status: DC | PRN

## 2014-12-23 SURGICAL SUPPLY — 42 items
BAG DECANTER FOR FLEXI CONT (MISCELLANEOUS) ×2 IMPLANT
BLADE SURG 15 STRL LF DISP TIS (BLADE) ×1 IMPLANT
BLADE SURG 15 STRL SS (BLADE) ×1
CANISTER SUCT 1200ML W/VALVE (MISCELLANEOUS) IMPLANT
CHLORAPREP W/TINT 26ML (MISCELLANEOUS) ×2 IMPLANT
CLEANER CAUTERY TIP 5X5 PAD (MISCELLANEOUS) ×1 IMPLANT
COVER BACK TABLE 60X90IN (DRAPES) ×2 IMPLANT
COVER MAYO STAND STRL (DRAPES) ×2 IMPLANT
DECANTER SPIKE VIAL GLASS SM (MISCELLANEOUS) IMPLANT
DRAPE C-ARM 42X72 X-RAY (DRAPES) ×2 IMPLANT
DRAPE LAPAROSCOPIC ABDOMINAL (DRAPES) ×2 IMPLANT
DRAPE UTILITY XL STRL (DRAPES) ×2 IMPLANT
ELECT REM PT RETURN 9FT ADLT (ELECTROSURGICAL) ×2
ELECTRODE REM PT RTRN 9FT ADLT (ELECTROSURGICAL) ×1 IMPLANT
GLOVE BIO SURGEON STRL SZ7.5 (GLOVE) ×2 IMPLANT
GOWN STRL REUS W/ TWL LRG LVL3 (GOWN DISPOSABLE) ×2 IMPLANT
GOWN STRL REUS W/TWL LRG LVL3 (GOWN DISPOSABLE) ×2
IV KIT MINILOC 20X1 SAFETY (NEEDLE) IMPLANT
KIT PORT POWER 8FR ISP CVUE (Catheter) ×2 IMPLANT
LIQUID BAND (GAUZE/BANDAGES/DRESSINGS) ×2 IMPLANT
NDL SAFETY ECLIPSE 18X1.5 (NEEDLE) IMPLANT
NEEDLE HYPO 18GX1.5 SHARP (NEEDLE)
NEEDLE HYPO 22GX1.5 SAFETY (NEEDLE) IMPLANT
NEEDLE HYPO 25X1 1.5 SAFETY (NEEDLE) ×2 IMPLANT
NEEDLE SPNL 22GX3.5 QUINCKE BK (NEEDLE) IMPLANT
PACK BASIN DAY SURGERY FS (CUSTOM PROCEDURE TRAY) ×2 IMPLANT
PAD CLEANER CAUTERY TIP 5X5 (MISCELLANEOUS) ×1
PENCIL BUTTON HOLSTER BLD 10FT (ELECTRODE) ×2 IMPLANT
SLEEVE SCD COMPRESS KNEE MED (MISCELLANEOUS) IMPLANT
SUT MON AB 4-0 PC3 18 (SUTURE) ×2 IMPLANT
SUT PROLENE 2 0 SH DA (SUTURE) ×2 IMPLANT
SUT SILK 2 0 TIES 17X18 (SUTURE)
SUT SILK 2-0 18XBRD TIE BLK (SUTURE) IMPLANT
SUT VIC AB 3-0 SH 27 (SUTURE) ×1
SUT VIC AB 3-0 SH 27X BRD (SUTURE) ×1 IMPLANT
SYR 5ML LL (SYRINGE) ×2 IMPLANT
SYR CONTROL 10ML LL (SYRINGE) ×4 IMPLANT
SYRINGE 10CC LL (SYRINGE) IMPLANT
TOWEL OR 17X24 6PK STRL BLUE (TOWEL DISPOSABLE) ×4 IMPLANT
TOWEL OR NON WOVEN STRL DISP B (DISPOSABLE) ×2 IMPLANT
TUBE CONNECTING 20X1/4 (TUBING) IMPLANT
YANKAUER SUCT BULB TIP NO VENT (SUCTIONS) IMPLANT

## 2014-12-23 NOTE — Anesthesia Postprocedure Evaluation (Signed)
  Anesthesia Post-op Note  Patient: Kaitlyn Keith  Procedure(s) Performed: Procedure(s): INSERTION PORT-A-CATH (N/A)  Patient Location: PACU  Anesthesia Type:General  Level of Consciousness: awake, alert  and oriented  Airway and Oxygen Therapy: Patient Spontanous Breathing  Post-op Pain: none  Post-op Assessment: Post-op Vital signs reviewed              Post-op Vital Signs: Reviewed  Last Vitals:  Filed Vitals:   12/23/14 1520  BP: 163/92  Pulse: 68  Temp: 36.6 C  Resp: 14    Complications: No apparent anesthesia complications

## 2014-12-23 NOTE — H&P (Signed)
Kaitlyn Keith 12/11/2014 9:07 AM Location: [facility] Patient #: [id number] DOB: [birth year] Undefined / Language: Kaitlyn Keith / Race: Undefined Female  History of Present Illness Kaitlyn Keith. Kaitlyn Starks MD; 12/11/2014 3:12 PM) The patient is a 49 year old female who presents with breast cancer. We are asked to see the patient in consultation by Dr. Barbette Keith to evaluate her for a right breast cancer. The patient is a 49 year old black female who recently felt a mass in the lower outer quadrant of the right breast. She denies any breast pain or discharge from the nipple. She does have a history of a breast reduction done in 2006. She brought the mass to the attention of her medical doctor who ordered a mammogram evaluation. The mass measured 1.9 cm by ultrasound and 2.4 cm by MRI. It was biopsied and came back as a grade 3 invasive ductal cancer. She was ER and PR negative and HER-2 negative with a Ki-67 of 90%.   Other Problems Kaitlyn Keith, CMA; 12/11/2014 9:08 AM) Anxiety Disorder Arthritis Back Pain Breast Cancer Depression High blood pressure Lump In Breast Migraine Headache  Past Surgical History Kaitlyn Keith, CMA; 12/11/2014 9:08 AM) Breast Biopsy Right. Foot Surgery Bilateral. Mammoplasty; Reduction Bilateral.  Diagnostic Studies History Kaitlyn Keith, [place]; 12/11/2014 9:08 AM) Colonoscopy never Mammogram within last year Pap Smear 1-5 years ago  Social History Kaitlyn Keith, [place]; 12/11/2014 9:08 AM) Alcohol use Moderate alcohol use. Caffeine use Carbonated beverages, Coffee, Tea. Tobacco use Never smoker.  Family History Kaitlyn Keith, [place]; 12/11/2014 9:08 AM) Arthritis Mother. Cancer Family Members In General. Hypertension Father, Mother. Ovarian Cancer Sister.  Pregnancy / Birth History Kaitlyn Keith, [place]; 12/11/2014 9:08 AM) Age at menarche 57 years. Contraceptive History Oral contraceptives. Gravida 1 Maternal age 82-30 Para  1 Regular periods  Review of Systems Kaitlyn Keith CMA; 12/11/2014 9:08 AM) General Present- Appetite Loss, Fatigue, Night Sweats and Weight Gain. Not Present- Chills, Fever and Weight Loss. Skin Present- Dryness. Not Present- Change in Wart/Mole, Hives, Jaundice, New Lesions, Non-Healing Wounds, Rash and Ulcer. HEENT Present- Seasonal Allergies, Sinus Pain and Visual Disturbances. Not Present- Earache, Hearing Loss, Hoarseness, Nose Bleed, Oral Ulcers, Ringing in the Ears, Sore Throat, Wears glasses/contact lenses and Yellow Eyes. Respiratory Present- Chronic Cough, Difficulty Breathing and Snoring. Not Present- Bloody sputum and Wheezing. Breast Present- Breast Mass. Not Present- Breast Pain, Nipple Discharge and Skin Changes. Cardiovascular Present- Palpitations, Shortness of Breath and Swelling of Extremities. Not Present- Chest Pain, Difficulty Breathing Lying Down, Leg Cramps and Rapid Heart Rate. Gastrointestinal Present- Bloating, Excessive gas, Gets full quickly at meals and Nausea. Not Present- Abdominal Pain, Bloody Stool, Change in Bowel Habits, Chronic diarrhea, Constipation, Difficulty Swallowing, Hemorrhoids, Indigestion, Rectal Pain and Vomiting. Female Genitourinary Not Present- Frequency, Nocturia, Painful Urination, Pelvic Pain and Urgency. Musculoskeletal Present- Back Pain and Joint Pain. Not Present- Joint Stiffness, Muscle Pain, Muscle Weakness and Swelling of Extremities. Neurological Present- Headaches, Numbness and Tingling. Not Present- Decreased Memory, Fainting, Seizures, Tremor, Trouble walking and Weakness. Psychiatric Present- Anxiety, Change in Sleep Pattern, Depression and Fearful. Not Present- Bipolar and Frequent crying. Endocrine Present- Heat Intolerance. Not Present- Cold Intolerance, Excessive Hunger, Keith Changes, Hot flashes and New Diabetes. Hematology Present- Easy Bruising. Not Present- Excessive bleeding, Gland problems, HIV and Persistent  Infections.   Physical Exam Kaitlyn Keith S. Kaitlyn Starks MD; 12/11/2014 3:13 PM) General Mental Status-Alert. General Appearance-Consistent with stated age. Hydration-Well hydrated. Voice-Normal.  Head and Neck Head-normocephalic, atraumatic with no lesions or palpable masses. Trachea-midline. Thyroid Gland Characteristics -  normal size and consistency.  Eye Eyeball - Bilateral-Extraocular movements intact. Sclera/Conjunctiva - Bilateral-No scleral icterus.  Chest and Lung Exam Chest and lung exam reveals -quiet, even and easy respiratory effort with no use of accessory muscles and on auscultation, normal breath sounds, no adventitious sounds and normal vocal resonance. Inspection Chest Wall - Normal. Back - normal.  Breast Note: There is a palpable mass in the lower outer quadrant of the right breast that measures between 2 and 3 cm. It is mobile and does not appear to be fixed to the chest wall. There is no palpable mass in the left breast. She has well-healed bilateral breast reduction scars. There is no palpable axillary, supraclavicular, or cervical lymphadenopathy.   Cardiovascular Cardiovascular examination reveals -normal heart sounds, regular rate and rhythm with no murmurs and normal pedal pulses bilaterally.  Abdomen Inspection Inspection of the abdomen reveals - No Hernias. Skin - Scar - no surgical scars. Palpation/Percussion Palpation and Percussion of the abdomen reveal - Soft, Non Tender, No Rebound tenderness, No Rigidity (guarding) and No hepatosplenomegaly. Auscultation Auscultation of the abdomen reveals - Bowel sounds normal.  Neurologic Neurologic evaluation reveals -alert and oriented x 3 with no impairment of recent or remote memory. Mental Status-Normal.  Musculoskeletal Normal Exam - Left-Upper Extremity Strength Normal and Lower Extremity Strength Normal. Normal Exam - Right-Upper Extremity Strength Normal and Lower Extremity  Strength Normal.  Lymphatic Head & Neck  General Head & Neck Lymphatics: Bilateral - Description - Normal. Axillary  General Axillary Region: Bilateral - Description - Normal. Tenderness - Non Tender. Femoral & Inguinal  Generalized Femoral & Inguinal Lymphatics: Bilateral - Description - Normal. Tenderness - Non Tender.    Assessment & Plan Kaitlyn Keith S. Kaitlyn Starks MD; 12/11/2014 3:16 PM) PRIMARY CANCER OF LOWER OUTER QUADRANT OF RIGHT FEMALE BREAST (174.5  C50.511) Impression: The patient appears to have a 2.4 cm cancer in the lower outer quadrant of the right breast. Because of its proximity to the chest wall and because she is a triple negative I think she would benefit from neoadjuvant chemotherapy. I have discussed with her in detail the different options for treatment and at this point she favors breast conservation as well. Since her lymph nodes are clinically negative she will also be a good candidate for sentinel node mapping. I have discussed with her in detail the risks and benefits of the operation to place a Port-A-Cath for the chemotherapy as well as some of the technical aspects and she understands and wishes to proceed Current Plans  Follow up in 1 month or as needed Pt Education - Breast Cancer: discussed with patient and provided information.   Signed by Luella Cook, MD (12/11/2014 3:17 PM)

## 2014-12-23 NOTE — Anesthesia Procedure Notes (Signed)
Procedure Name: LMA Insertion Date/Time: 12/23/2014 1:33 PM Performed by: Toula Moos L Pre-anesthesia Checklist: Patient identified, Emergency Drugs available, Suction available, Patient being monitored and Timeout performed Patient Re-evaluated:Patient Re-evaluated prior to inductionOxygen Delivery Method: Circle System Utilized Preoxygenation: Pre-oxygenation with 100% oxygen Intubation Type: IV induction Ventilation: Mask ventilation without difficulty LMA: LMA inserted LMA Size: 4.0 Number of attempts: 1 Airway Equipment and Method: Bite block Placement Confirmation: positive ETCO2 Tube secured with: Tape Dental Injury: Teeth and Oropharynx as per pre-operative assessment

## 2014-12-23 NOTE — Transfer of Care (Signed)
Immediate Anesthesia Transfer of Care Note  Patient: Kaitlyn Keith  Procedure(s) Performed: Procedure(s): INSERTION PORT-A-CATH (N/A)  Patient Location: PACU  Anesthesia Type:General  Level of Consciousness: awake and patient cooperative  Airway & Oxygen Therapy: Patient Spontanous Breathing and Patient connected to face mask oxygen  Post-op Assessment: Report given to RN and Post -op Vital signs reviewed and stable  Post vital signs: Reviewed and stable  Last Vitals:  Filed Vitals:   12/23/14 1415  BP:   Pulse: 90  Temp:   Resp: 20    Complications: No apparent anesthesia complications

## 2014-12-23 NOTE — Discharge Instructions (Signed)

## 2014-12-23 NOTE — Anesthesia Preprocedure Evaluation (Addendum)
Anesthesia Evaluation  Patient identified by MRN, date of birth, ID band Patient awake    Reviewed: Allergy & Precautions, NPO status , Patient's Chart, lab work & pertinent test results  Airway Mallampati: I  TM Distance: >3 FB Neck ROM: Full    Dental   Pulmonary neg pulmonary ROS,  breath sounds clear to auscultation        Cardiovascular hypertension, Pt. on medications Rhythm:Regular Rate:Normal     Neuro/Psych Anxiety Depression negative neurological ROS     GI/Hepatic negative GI ROS, Neg liver ROS,   Endo/Other  negative endocrine ROS  Renal/GU negative Renal ROS     Musculoskeletal  (+) Arthritis -,   Abdominal   Peds  Hematology negative hematology ROS (+)   Anesthesia Other Findings   Reproductive/Obstetrics                            Anesthesia Physical Anesthesia Plan  ASA: II  Anesthesia Plan: General   Post-op Pain Management:    Induction: Intravenous  Airway Management Planned: LMA  Additional Equipment:   Intra-op Plan:   Post-operative Plan:   Informed Consent: I have reviewed the patients History and Physical, chart, labs and discussed the procedure including the risks, benefits and alternatives for the proposed anesthesia with the patient or authorized representative who has indicated his/her understanding and acceptance.   Dental advisory given  Plan Discussed with: CRNA  Anesthesia Plan Comments:         Anesthesia Quick Evaluation

## 2014-12-23 NOTE — Op Note (Signed)
12/23/2014  2:13 PM  PATIENT:  Kaitlyn Keith  49 y.o. female  PRE-OPERATIVE DIAGNOSIS:  Right Breast Cancer  POST-OPERATIVE DIAGNOSIS:  Right Breast Cancer  PROCEDURE:  Procedure(s): INSERTION PORT-A-CATH (N/A) Left subclavian vein  SURGEON:  Surgeon(s) and Role:    * [name], MD - Primary  PHYSICIAN ASSISTANT:   ASSISTANTS: none   ANESTHESIA:   general  EBL:     BLOOD ADMINISTERED:none  DRAINS: none   LOCAL MEDICATIONS USED:  MARCAINE     SPECIMEN:  No Specimen  DISPOSITION OF SPECIMEN:  N/A  COUNTS:  YES  TOURNIQUET:  * No tourniquets in log *  DICTATION: .Dragon Dictation  After informed consent was obtained the patient was brought to the operating room and placed in the supine position on the operating room table. After adequate induction of general anesthesia a roll was placed between the patient's shoulder blades to extend the shoulder slightly. The left chest and neck area were then prepped with ChloraPrep, allowed to dry, and draped in usual sterile manner. The patient was placed in Trendelenburg position. The area lateral to the bend of the clavicle on the left side was infiltrated with quarter percent Marcaine. A small incision was made with a 15 blade knife in this area. A large bore needle from the Port-A-Cath kit was then used to slide beneath the bend of the clavicle on the left chest heading towards the sternal notch and in doing so we were able to access the left subclavian vein without difficulty. A wire was fed through the needle using the Seldinger technique without difficulty. The wire was confirmed in the central venous system using real-time fluoroscopy. The incision on the left chest was extended slightly with a 15 blade knife. A subcutaneous pocket was created inferior to the incision by combination of blunt finger dissection and some sharp dissection with the electrocautery. Next the tubing was placed on the reservoir and the reservoir was placed  in the pocket. The length of the tubing was estimated using real-time fluoroscopy and cut to the appropriate length. Next a sheath and dilator were fed over the wire using the Seldinger technique without difficulty. The dilator and wire were removed. The tubing was fed through the sheath as far as it would go and held in place while the sheath was gently cracked and separated. Another real-time fluoroscopy image showed the tip of the catheter to be in the distal superior vena cava. The tubing was then permanently anchored to the reservoir. The reservoir was anchored in the pocket with 2 2-0 Prolene stitches. The port was then aspirated and it aspirated blood easily. The port Was then flushed initially with a dilute heparin solution and then with a more concentrated heparin solution. The subcutaneous tissue was then closed over the port with interrupted 3-0 Vicryl stitches. The skin was closed with a running 4-0 Monocryl subcuticular stitch. Dermabond dressings were applied. The patient tolerated the procedure well. At the end of the case all needle sponge and instrument counts were correct. The patient was then awakened and taken to recovery in stable condition.  PLAN OF CARE: Discharge to home after PACU  PATIENT DISPOSITION:  PACU - hemodynamically stable.   Delay start of Pharmacological VTE agent (>24hrs) due to surgical blood loss or risk of bleeding: not applicable

## 2014-12-23 NOTE — Interval H&P Note (Signed)
History and Physical Interval Note:  12/23/2014 12:50 PM  Kaitlyn Keith  has presented today for surgery, with the diagnosis of Right Breast Cancer  The various methods of treatment have been discussed with the patient and family. After consideration of risks, benefits and other options for treatment, the patient has consented to  Procedure(s): INSERTION PORT-A-CATH (N/A) as a surgical intervention .  The patient's history has been reviewed, patient examined, no change in status, stable for surgery.  I have reviewed the patient's chart and labs.  Questions were answered to the patient's satisfaction.     TOTH III,Attilio Zeitler S

## 2014-12-24 ENCOUNTER — Other Ambulatory Visit: Payer: BLUE CROSS/BLUE SHIELD

## 2014-12-24 ENCOUNTER — Encounter: Payer: Self-pay | Admitting: Hematology and Oncology

## 2014-12-24 ENCOUNTER — Encounter: Payer: Self-pay | Admitting: *Deleted

## 2014-12-24 ENCOUNTER — Telehealth: Payer: Self-pay | Admitting: Hematology and Oncology

## 2014-12-24 ENCOUNTER — Other Ambulatory Visit (HOSPITAL_BASED_OUTPATIENT_CLINIC_OR_DEPARTMENT_OTHER): Payer: BLUE CROSS/BLUE SHIELD

## 2014-12-24 ENCOUNTER — Ambulatory Visit (HOSPITAL_COMMUNITY)
Admission: RE | Admit: 2014-12-24 | Discharge: 2014-12-24 | Disposition: A | Discharge status: Home / Self Care | Payer: BLUE CROSS/BLUE SHIELD | Source: Ambulatory Visit | Attending: Hematology and Oncology | Admitting: Hematology and Oncology

## 2014-12-24 ENCOUNTER — Ambulatory Visit (HOSPITAL_BASED_OUTPATIENT_CLINIC_OR_DEPARTMENT_OTHER): Payer: BLUE CROSS/BLUE SHIELD

## 2014-12-24 ENCOUNTER — Ambulatory Visit: Payer: BLUE CROSS/BLUE SHIELD | Admitting: Hematology and Oncology

## 2014-12-24 ENCOUNTER — Encounter: Payer: Self-pay | Admitting: General Practice

## 2014-12-24 ENCOUNTER — Ambulatory Visit (HOSPITAL_BASED_OUTPATIENT_CLINIC_OR_DEPARTMENT_OTHER): Payer: BLUE CROSS/BLUE SHIELD | Admitting: Hematology and Oncology

## 2014-12-24 VITALS — BP 189/92 | HR 55 | Temp 98.1°F | Resp 18 | Ht 63.0 in | Wt 172.0 lb

## 2014-12-24 DIAGNOSIS — Z5111 Encounter for antineoplastic chemotherapy: Secondary | ICD-10-CM | POA: Diagnosis not present

## 2014-12-24 DIAGNOSIS — I10 Essential (primary) hypertension: Secondary | ICD-10-CM | POA: Diagnosis not present

## 2014-12-24 DIAGNOSIS — C50511 Malignant neoplasm of lower-outer quadrant of right female breast: Secondary | ICD-10-CM

## 2014-12-24 DIAGNOSIS — Z01818 Encounter for other preprocedural examination: Secondary | ICD-10-CM | POA: Diagnosis not present

## 2014-12-24 DIAGNOSIS — C50919 Malignant neoplasm of unspecified site of unspecified female breast: Secondary | ICD-10-CM | POA: Insufficient documentation

## 2014-12-24 DIAGNOSIS — Z5189 Encounter for other specified aftercare: Secondary | ICD-10-CM | POA: Diagnosis not present

## 2014-12-24 DIAGNOSIS — Z171 Estrogen receptor negative status [ER-]: Secondary | ICD-10-CM | POA: Diagnosis not present

## 2014-12-24 LAB — COMPREHENSIVE METABOLIC PANEL (CC13)
ALBUMIN: 3.8 g/dL (ref 3.5–5.0)
ALT: 10 U/L (ref 0–55)
AST: 15 U/L (ref 5–34)
Alkaline Phosphatase: 69 U/L (ref 40–150)
Anion Gap: 4 mEq/L (ref 3–11)
BUN: 8.3 mg/dL (ref 7.0–26.0)
CALCIUM: 9.6 mg/dL (ref 8.4–10.4)
CHLORIDE: 108 meq/L (ref 98–109)
CO2: 28 mEq/L (ref 22–29)
CREATININE: 0.7 mg/dL (ref 0.6–1.1)
EGFR: >90 mL/min/{1.73_m2} (ref >90)
GLUCOSE: 84 mg/dL (ref 70–140)
POTASSIUM: 3.8 meq/L (ref 3.5–5.1)
Sodium: 140 mEq/L (ref 136–145)
Total Bilirubin: 0.61 mg/dL (ref 0.20–1.20)
Total Protein: 7.2 g/dL (ref 6.4–8.3)

## 2014-12-24 LAB — CBC WITH DIFFERENTIAL/PLATELET
BASO%: 0.1 % (ref 0.0–2.0)
BASOS ABS: 0 10*3/uL (ref 0.0–0.1)
EOS ABS: 0 10*3/uL (ref 0.0–0.5)
EOS%: 0.1 % (ref 0.0–7.0)
HCT: 42 % (ref 34.8–46.6)
HGB: 13.9 g/dL (ref 11.6–15.9)
LYMPH%: 13.1 % — AB (ref 14.0–49.7)
MCH: 29.8 pg (ref 25.1–34.0)
MCHC: 33 g/dL (ref 31.5–36.0)
MCV: 90.3 fL (ref 79.5–101.0)
MONO#: 0.8 10*3/uL (ref 0.1–0.9)
MONO%: 7 % (ref 0.0–14.0)
NEUT#: 9.2 10*3/uL — ABNORMAL HIGH (ref 1.5–6.5)
NEUT%: 79.7 % — ABNORMAL HIGH (ref 38.4–76.8)
Platelets: 191 10*3/uL (ref 145–400)
RBC: 4.65 10*6/uL (ref 3.70–5.45)
RDW: 12.5 % (ref 11.2–14.5)
WBC: 11.6 10*3/uL — ABNORMAL HIGH (ref 3.9–10.3)
lymph#: 1.5 10*3/uL (ref 0.9–3.3)

## 2014-12-24 MED ORDER — PALONOSETRON HCL INJECTION 0.25 MG/5ML
INTRAVENOUS | Status: AC
  Filled 2014-12-24: qty 5

## 2014-12-24 MED ORDER — SODIUM CHLORIDE 0.9 % IV SOLN
Freq: Once | INTRAVENOUS | Status: AC
  Administered 2014-12-24: 14:00:00 via INTRAVENOUS
  Filled 2014-12-24: qty 5

## 2014-12-24 MED ORDER — SODIUM CHLORIDE 0.9 % IJ SOLN
10.0000 mL | INTRAMUSCULAR | Status: DC | PRN
  Administered 2014-12-24: 10 mL
  Filled 2014-12-24: qty 10

## 2014-12-24 MED ORDER — PEGFILGRASTIM 6 MG/0.6ML ~~LOC~~ PSKT
6.0000 mg | PREFILLED_SYRINGE | Freq: Once | SUBCUTANEOUS | Status: AC
  Administered 2014-12-24: 6 mg via SUBCUTANEOUS
  Filled 2014-12-24: qty 0.6

## 2014-12-24 MED ORDER — PALONOSETRON HCL INJECTION 0.25 MG/5ML
0.2500 mg | Freq: Once | INTRAVENOUS | Status: AC
  Administered 2014-12-24: 0.25 mg via INTRAVENOUS

## 2014-12-24 MED ORDER — DOXORUBICIN HCL CHEMO IV INJECTION 2 MG/ML
60.0000 mg/m2 | Freq: Once | INTRAVENOUS | Status: AC
  Administered 2014-12-24: 112 mg via INTRAVENOUS
  Filled 2014-12-24: qty 56

## 2014-12-24 MED ORDER — HEPARIN SOD (PORK) LOCK FLUSH 100 UNIT/ML IV SOLN
500.0000 [IU] | Freq: Once | INTRAVENOUS | Status: AC | PRN
  Administered 2014-12-24: 500 [IU]
  Filled 2014-12-24: qty 5

## 2014-12-24 MED ORDER — SODIUM CHLORIDE 0.9 % IV SOLN
Freq: Once | INTRAVENOUS | Status: AC
  Administered 2014-12-24: 14:00:00 via INTRAVENOUS

## 2014-12-24 MED ORDER — CYCLOPHOSPHAMIDE CHEMO INJECTION 1 GM
600.0000 mg/m2 | Freq: Once | INTRAMUSCULAR | Status: AC
  Administered 2014-12-24: 1120 mg via INTRAVENOUS
  Filled 2014-12-24: qty 56

## 2014-12-24 NOTE — Telephone Encounter (Signed)
Appointments made and avs printed for patient °

## 2014-12-24 NOTE — Telephone Encounter (Signed)
Patient called in and request to reschedule her genetics appointment, new schedule/calendar printed for patient sister

## 2014-12-24 NOTE — Assessment & Plan Note (Addendum)
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 Treatment plan: 1. Neo-adjuvant chemotherapy with dose dense Adriamycin and Cytoxan 4 followed by Abraxane and carboplatin once a week 12 2. Followed by repeat breast MRI followed by surgery 3. Followed by radiation -------------------------------------------------------------------------------------------------------------------------------------------------- Current treatment: Cycle 1 day 1 dose dense Adriamycin and Cytoxan Chemotherapy monitoring: 1. Port has been placed 2. Echocardiogram to be done today 12/24/2014 3. Chemotherapy classes been completed 4. Patient filled her antiemetics  Return to clinic in 1 week for toxicity check

## 2014-12-24 NOTE — Telephone Encounter (Signed)
Appointments made and avs printed °

## 2014-12-24 NOTE — Progress Notes (Signed)
Enrolled patient with amgen 1step for neulsta

## 2014-12-24 NOTE — Progress Notes (Signed)
Shauna/Raquel advised patient of 1st amgen-neulasta copay asst. She will send her proof of to [place] for poss asst with other foundations based on her plan of care.

## 2014-12-24 NOTE — Patient Instructions (Signed)
Cyclophosphamide injection What is this medicine? CYCLOPHOSPHAMIDE (sye kloe FOSS fa mide) is a chemotherapy drug. It slows the growth of cancer cells. This medicine is used to treat many types of cancer like lymphoma, myeloma, leukemia, breast cancer, and ovarian cancer, to name a few. This medicine may be used for other purposes; ask your health care provider or pharmacist if you have questions. COMMON BRAND NAME(S): Cytoxan, Neosar What should I tell my health care provider before I take this medicine? They need to know if you have any of these conditions: -blood disorders -history of other chemotherapy -infection -kidney disease -liver disease -recent or ongoing radiation therapy -tumors in the bone marrow -an unusual or allergic reaction to cyclophosphamide, other chemotherapy, other medicines, foods, dyes, or preservatives -pregnant or trying to get pregnant -breast-feeding How should I use this medicine? This drug is usually given as an injection into a vein or muscle or by infusion into a vein. It is administered in a hospital or clinic by a specially trained health care professional. Talk to your pediatrician regarding the use of this medicine in children. Special care may be needed. Overdosage: If you think you have taken too much of this medicine contact a poison control center or emergency room at once. NOTE: This medicine is only for you. Do not share this medicine with others. What if I miss a dose? It is important not to miss your dose. Call your doctor or health care professional if you are unable to keep an appointment. What may interact with this medicine? This medicine may interact with the following medications: -amiodarone -amphotericin B -azathioprine -certain antiviral medicines for HIV or AIDS such as protease inhibitors (e.g., indinavir, ritonavir) and zidovudine -certain blood pressure medications such as benazepril, captopril, enalapril, fosinopril,  lisinopril, moexipril, monopril, perindopril, quinapril, ramipril, trandolapril -certain cancer medications such as anthracyclines (e.g., daunorubicin, doxorubicin), busulfan, cytarabine, paclitaxel, pentostatin, tamoxifen, trastuzumab -certain diuretics such as chlorothiazide, chlorthalidone, hydrochlorothiazide, indapamide, metolazone -certain medicines that treat or prevent blood clots like warfarin -certain muscle relaxants such as succinylcholine -cyclosporine -etanercept -indomethacin -medicines to increase blood counts like filgrastim, pegfilgrastim, sargramostim -medicines used as general anesthesia -metronidazole -natalizumab This list may not describe all possible interactions. Give your health care provider a list of all the medicines, herbs, non-prescription drugs, or dietary supplements you use. Also tell them if you smoke, drink alcohol, or use illegal drugs. Some items may interact with your medicine. What should I watch for while using this medicine? Visit your doctor for checks on your progress. This drug may make you feel generally unwell. This is not uncommon, as chemotherapy can affect healthy cells as well as cancer cells. Report any side effects. Continue your course of treatment even though you feel ill unless your doctor tells you to stop. Drink water or other fluids as directed. Urinate often, even at night. In some cases, you may be given additional medicines to help with side effects. Follow all directions for their use. Call your doctor or health care professional for advice if you get a fever, chills or sore throat, or other symptoms of a cold or flu. Do not treat yourself. This drug decreases your body's ability to fight infections. Try to avoid being around people who are sick. This medicine may increase your risk to bruise or bleed. Call your doctor or health care professional if you notice any unusual bleeding. Be careful brushing and flossing your teeth or using a  toothpick because you may get an infection or bleed   more easily. If you have any dental work done, tell your dentist you are receiving this medicine. You may get drowsy or dizzy. Do not drive, use machinery, or do anything that needs mental alertness until you know how this medicine affects you. Do not become pregnant while taking this medicine or for 1 year after stopping it. Women should inform their doctor if they wish to become pregnant or think they might be pregnant. Men should not father a child while taking this medicine and for 4 months after stopping it. There is a potential for serious side effects to an unborn child. Talk to your health care professional or pharmacist for more information. Do not breast-feed an infant while taking this medicine. This medicine may interfere with the ability to have a child. This medicine has caused ovarian failure in some women. This medicine has caused reduced sperm counts in some men. You should talk with your doctor or health care professional if you are concerned about your fertility. If you are going to have surgery, tell your doctor or health care professional that you have taken this medicine. What side effects may I notice from receiving this medicine? Side effects that you should report to your doctor or health care professional as soon as possible: -allergic reactions like skin rash, itching or hives, swelling of the face, lips, or tongue -low blood counts - this medicine may decrease the number of white blood cells, red blood cells and platelets. You may be at increased risk for infections and bleeding. -signs of infection - fever or chills, cough, sore throat, pain or difficulty passing urine -signs of decreased platelets or bleeding - bruising, pinpoint red spots on the skin, black, tarry stools, blood in the urine -signs of decreased red blood cells - unusually weak or tired, fainting spells, lightheadedness -breathing problems -dark  urine -dizziness -palpitations -swelling of the ankles, feet, hands -trouble passing urine or change in the amount of urine -weight gain -yellowing of the eyes or skin Side effects that usually do not require medical attention (report to your doctor or health care professional if they continue or are bothersome): -changes in nail or skin color -hair loss -missed menstrual periods -mouth sores -nausea, vomiting This list may not describe all possible side effects. Call your doctor for medical advice about side effects. You may report side effects to FDA at 1-800-FDA-1088. Where should I keep my medicine? This drug is given in a hospital or clinic and will not be stored at home. NOTE: This sheet is a summary. It may not cover all possible information. If you have questions about this medicine, talk to your doctor, pharmacist, or health care provider.  2015, [employer]. (2012-03-24 16:22:58)Doxorubicin injection What is this medicine? DOXORUBICIN (dox oh ROO bi sin) is a chemotherapy drug. It is used to treat many kinds of cancer like Hodgkin's disease, leukemia, non-Hodgkin's lymphoma, neuroblastoma, sarcoma, and Wilms' tumor. It is also used to treat bladder cancer, breast cancer, lung cancer, ovarian cancer, stomach cancer, and thyroid cancer. This medicine may be used for other purposes; ask your health care provider or pharmacist if you have questions. COMMON BRAND NAME(S): Adriamycin, Adriamycin PFS, Adriamycin RDF, Rubex What should I tell my health care provider before I take this medicine? They need to know if you have any of these conditions: -blood disorders -heart disease, recent heart attack -infection (especially a virus infection such as chickenpox, cold sores, or herpes) -irregular heartbeat -liver disease -recent or ongoing radiation therapy -an  unusual or allergic reaction to doxorubicin, other chemotherapy agents, other medicines, foods, dyes, or  preservatives -pregnant or trying to get pregnant -breast-feeding How should I use this medicine? This drug is given as an infusion into a vein. It is administered in a hospital or clinic by a specially trained health care professional. If you have pain, swelling, burning or any unusual feeling around the site of your injection, tell your health care professional right away. Talk to your pediatrician regarding the use of this medicine in children. Special care may be needed. Overdosage: If you think you have taken too much of this medicine contact a poison control center or emergency room at once. NOTE: This medicine is only for you. Do not share this medicine with others. What if I miss a dose? It is important not to miss your dose. Call your doctor or health care professional if you are unable to keep an appointment. What may interact with this medicine? Do not take this medicine with any of the following medications: -cisapride -droperidol -halofantrine -pimozide -zidovudine This medicine may also interact with the following medications: -chloroquine -chlorpromazine -clarithromycin -cyclophosphamide -cyclosporine -erythromycin -medicines for depression, anxiety, or psychotic disturbances -medicines for irregular heart beat like amiodarone, bepridil, dofetilide, encainide, flecainide, propafenone, quinidine -medicines for seizures like ethotoin, fosphenytoin, phenytoin -medicines for nausea, vomiting like dolasetron, ondansetron, palonosetron -medicines to increase blood counts like filgrastim, pegfilgrastim, sargramostim -methadone -methotrexate -pentamidine -progesterone -vaccines -verapamil Talk to your doctor or health care professional before taking any of these medicines: -acetaminophen -aspirin -ibuprofen -ketoprofen -naproxen This list may not describe all possible interactions. Give your health care provider a list of all the medicines, herbs, non-prescription  drugs, or dietary supplements you use. Also tell them if you smoke, drink alcohol, or use illegal drugs. Some items may interact with your medicine. What should I watch for while using this medicine? Your condition will be monitored carefully while you are receiving this medicine. You will need important blood work done while you are taking this medicine. This drug may make you feel generally unwell. This is not uncommon, as chemotherapy can affect healthy cells as well as cancer cells. Report any side effects. Continue your course of treatment even though you feel ill unless your doctor tells you to stop. Your urine may turn red for a few days after your dose. This is not blood. If your urine is dark or brown, call your doctor. In some cases, you may be given additional medicines to help with side effects. Follow all directions for their use. Call your doctor or health care professional for advice if you get a fever, chills or sore throat, or other symptoms of a cold or flu. Do not treat yourself. This drug decreases your body's ability to fight infections. Try to avoid being around people who are sick. This medicine may increase your risk to bruise or bleed. Call your doctor or health care professional if you notice any unusual bleeding. Be careful brushing and flossing your teeth or using a toothpick because you may get an infection or bleed more easily. If you have any dental work done, tell your dentist you are receiving this medicine. Avoid taking products that contain aspirin, acetaminophen, ibuprofen, naproxen, or ketoprofen unless instructed by your doctor. These medicines may hide a fever. Men and women of childbearing age should use effective birth control methods while using taking this medicine. Do not become pregnant while taking this medicine. There is a potential for serious side effects to an  unborn child. Talk to your health care professional or pharmacist for more information. Do not  breast-feed an infant while taking this medicine. Do not let others touch your urine or other body fluids for 5 days after each treatment with this medicine. Caregivers should wear latex gloves to avoid touching body fluids during this time. There is a maximum amount of this medicine you should receive throughout your life. The amount depends on the medical condition being treated and your overall health. Your doctor will watch how much of this medicine you receive in your lifetime. Tell your doctor if you have taken this medicine before. What side effects may I notice from receiving this medicine? Side effects that you should report to your doctor or health care professional as soon as possible: -allergic reactions like skin rash, itching or hives, swelling of the face, lips, or tongue -low blood counts - this medicine may decrease the number of white blood cells, red blood cells and platelets. You may be at increased risk for infections and bleeding. -signs of infection - fever or chills, cough, sore throat, pain or difficulty passing urine -signs of decreased platelets or bleeding - bruising, pinpoint red spots on the skin, black, tarry stools, blood in the urine -signs of decreased red blood cells - unusually weak or tired, fainting spells, lightheadedness -breathing problems -chest pain -fast, irregular heartbeat -mouth sores -nausea, vomiting -pain, swelling, redness at site where injected -pain, tingling, numbness in the hands or feet -swelling of ankles, feet, or hands -unusual bleeding or bruising Side effects that usually do not require medical attention (report to your doctor or health care professional if they continue or are bothersome): -diarrhea -facial flushing -hair loss -loss of appetite -missed menstrual periods -nail discoloration or damage -red or watery eyes -red colored urine -stomach upset This list may not describe all possible side effects. Call your doctor for  medical advice about side effects. You may report side effects to FDA at 1-800-FDA-1088. Where should I keep my medicine? This drug is given in a hospital or clinic and will not be stored at home. NOTE: This sheet is a summary. It may not cover all possible information. If you have questions about this medicine, talk to your doctor, pharmacist, or health care provider.  2015, [employer]. (2012-09-05 09:54:34)  Pegfilgrastim injection What is this medicine? PEGFILGRASTIM (peg fil GRA stim) is a long-acting granulocyte colony-stimulating factor that stimulates the growth of neutrophils, a type of white blood cell important in the body's fight against infection. It is used to reduce the incidence of fever and infection in patients with certain types of cancer who are receiving chemotherapy that affects the bone marrow. This medicine may be used for other purposes; ask your health care provider or pharmacist if you have questions. COMMON BRAND NAME(S): Neulasta What should I tell my health care provider before I take this medicine? They need to know if you have any of these conditions: -latex allergy -ongoing radiation therapy -sickle cell disease -skin reactions to acrylic adhesives (On-Body Injector only) -an unusual or allergic reaction to pegfilgrastim, filgrastim, other medicines, foods, dyes, or preservatives -pregnant or trying to get pregnant -breast-feeding How should I use this medicine? This medicine is for injection under the skin. If you get this medicine at home, you will be taught how to prepare and give the pre-filled syringe or how to use the On-body Injector. Refer to the patient Instructions for Use for detailed instructions. Use exactly as directed. Take your  medicine at regular intervals. Do not take your medicine more often than directed. It is important that you put your used needles and syringes in a special sharps container. Do not put them in a trash can. If  you do not have a sharps container, call your pharmacist or healthcare provider to get one. Talk to your pediatrician regarding the use of this medicine in children. Special care may be needed. Overdosage: If you think you have taken too much of this medicine contact a poison control center or emergency room at once. NOTE: This medicine is only for you. Do not share this medicine with others. What if I miss a dose? It is important not to miss your dose. Call your doctor or health care professional if you miss your dose. If you miss a dose due to an On-body Injector failure or leakage, a new dose should be administered as soon as possible using a single prefilled syringe for manual use. What may interact with this medicine? Interactions have not been studied. Give your health care provider a list of all the medicines, herbs, non-prescription drugs, or dietary supplements you use. Also tell them if you smoke, drink alcohol, or use illegal drugs. Some items may interact with your medicine. This list may not describe all possible interactions. Give your health care provider a list of all the medicines, herbs, non-prescription drugs, or dietary supplements you use. Also tell them if you smoke, drink alcohol, or use illegal drugs. Some items may interact with your medicine. What should I watch for while using this medicine? You may need blood work done while you are taking this medicine. If you are going to need a MRI, CT scan, or other procedure, tell your doctor that you are using this medicine (On-Body Injector only). What side effects may I notice from receiving this medicine? Side effects that you should report to your doctor or health care professional as soon as possible: -allergic reactions like skin rash, itching or hives, swelling of the face, lips, or tongue -dizziness -fever -pain, redness, or irritation at site where injected -pinpoint red spots on the skin -shortness of breath or  breathing problems -stomach or side pain, or pain at the shoulder -swelling -tiredness -trouble passing urine Side effects that usually do not require medical attention (report to your doctor or health care professional if they continue or are bothersome): -bone pain -muscle pain This list may not describe all possible side effects. Call your doctor for medical advice about side effects. You may report side effects to FDA at 1-800-FDA-1088. Where should I keep my medicine? Keep out of the reach of children. Store pre-filled syringes in a refrigerator between 2 and 8 degrees C (36 and 46 degrees F). Do not freeze. Keep in carton to protect from light. Throw away this medicine if it is left out of the refrigerator for more than 48 hours. Throw away any unused medicine after the expiration date. NOTE: This sheet is a summary. It may not cover all possible information. If you have questions about this medicine, talk to your doctor, pharmacist, or health care provider.  2015, [employer]. (2013-08-09 16:14:05)

## 2014-12-24 NOTE — Progress Notes (Signed)
Patient Care Team: Secundino Ginger, [name], [name], [name], [name], [name], [name], NP as Nurse Practitioner (Nurse Practitioner)  DIAGNOSIS: Breast cancer of lower-outer quadrant of right female breast   Staging form: Breast, AJCC 7th Edition     Clinical stage from 12/11/2014: Stage IIA (T2, N0, M0) - Unsigned       Staging comments: Staged at breast conference on 7.20.16    SUMMARY OF ONCOLOGIC HISTORY:   Breast cancer of lower-outer quadrant of right female breast   12/03/2014 Mammogram Right breast mass 1.9 cm it o'clock position 8 cm depth from the nipple   12/03/2014 Initial Diagnosis Right breast biopsy 8:00: Invasive ductal carcinoma, grade 3, ER 0%, PR 0%, 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%, HER-2 negative ratio 1.43   12/10/2014 Breast MRI Right breast lower outer quadrant: 2.3 x 2.4 x 2.4 cm rim-enhancing mass abuts the pectoralis fascia but no enhancement of pectoralis muscle, second focus of artifact?'s second tissue marker clip, no lymph nodes   12/24/2014 -  Neo-Adjuvant Chemotherapy Dose dense Adriamycin and Cytoxan 4 followed by weekly Abraxane and carboplatin 12    CHIEF COMPLIANT: Cycle 1 day 1 of dose dense Adriamycin Cytoxan neo adjuvant chemotherapy  INTERVAL HISTORY: Kaitlyn Keith is a 49 yr old with the above-mentioned history of right breast cancer currently on neo adjuvant chemotherapy with dose dense Adriamycin and Cytoxan. She had undergone port placement and completed chemotherapy education and echocardiogram which showed an ejection fraction of 60-65%. She is very anxious to get started with the treatment. And very nervous as well.  REVIEW OF SYSTEMS:   Constitutional: Denies fevers, chills or abnormal weight  loss Eyes: Denies blurriness of vision Ears, nose, mouth, throat, and face: Denies mucositis or sore throat Respiratory: Denies cough, dyspnea or wheezes Cardiovascular: Denies palpitation, chest discomfort or lower extremity swelling Gastrointestinal:  Denies nausea, heartburn or change in bowel habits Skin: Denies abnormal skin rashes Lymphatics: Denies new lymphadenopathy or easy bruising Neurological:Denies numbness, tingling or new weaknesses Behavioral/Psych: Anxious nervous  All other systems were reviewed with the patient and are negative.  I have reviewed the past medical history, past surgical history, social history and family history with the patient and they are unchanged from previous note.  ALLERGIES:  has No Known Allergies.  MEDICATIONS:  Current Outpatient Prescriptions  Medication Sig Dispense Refill  . ALPRAZolam (XANAX) 1 MG tablet     . dexamethasone (DECADRON) 4 MG tablet Take 1 tablet (4 mg total) by mouth 2 (two) times daily. Take 2 tablets by mouth once a day on the day after chemotherapy and then take 2 tablets two times a day for 2 days. Take with food. 30 tablet 1  . lidocaine-prilocaine (EMLA) cream Apply to affected area once 30 g 3  . lisinopril-hydrochlorothiazide (PRINZIDE,ZESTORETIC) 20-12.5 MG per tablet     . LORazepam (ATIVAN) 0.5 MG tablet Take 1 tablet (0.5 mg total) by mouth every 6 (six) hours as needed (Nausea or vomiting). 30 tablet 0  . ondansetron (ZOFRAN) 8 MG tablet Take 1 tablet (8 mg total) by mouth 2 (two) times daily as needed. Start on the third day after chemotherapy. 30 tablet 1  . oxyCODONE-acetaminophen (ROXICET) 5-325 MG per tablet Take 1-2 tablets by mouth every 4 (four) hours as  needed. 50 tablet 0  . prochlorperazine (COMPAZINE) 10 MG tablet Take 1 tablet (10 mg total) by mouth every 6 (six) hours as needed (Nausea or vomiting). 30 tablet 1  . sertraline (ZOLOFT) 50 MG tablet      No current facility-administered medications  for this visit.    PHYSICAL EXAMINATION: ECOG PERFORMANCE STATUS: 0 - Asymptomatic  Filed Vitals:   12/24/14 1149  BP: 189/92  Pulse: 55  Temp: 98.1 F (36.7 C)  Resp: 18   Filed Weights   12/24/14 1149  Weight: 172 lb (78.019 kg)    GENERAL:alert, no distress and comfortable SKIN: skin color, texture, turgor are normal, no rashes or significant lesions EYES: normal, Conjunctiva are pink and non-injected, sclera clear OROPHARYNX:no exudate, no erythema and lips, buccal mucosa, and tongue normal  NECK: supple, thyroid normal size, non-tender, without nodularity LYMPH:  no palpable lymphadenopathy in the cervical, axillary or inguinal LUNGS: clear to auscultation and percussion with normal breathing effort HEART: regular rate & rhythm and no murmurs and no lower extremity edema ABDOMEN:abdomen soft, non-tender and normal bowel sounds Musculoskeletal:no cyanosis of digits and no clubbing  NEURO: alert & oriented x 3 with fluent speech, no focal motor/sensory deficits  LABORATORY DATA:  I have reviewed the data as listed   Chemistry      Component Value Date/Time   NA 140 12/11/2014 1220   K 3.9 12/11/2014 1220   CO2 28 12/11/2014 1220   BUN 9.1 12/11/2014 1220   CREATININE 0.7 12/11/2014 1220      Component Value Date/Time   CALCIUM 8.9 12/11/2014 1220   ALKPHOS 77 12/11/2014 1220   AST 15 12/11/2014 1220   ALT 9 12/11/2014 1220   BILITOT 0.59 12/11/2014 1220       Lab Results  Component Value Date   WBC 11.6* 12/24/2014   HGB 13.9 12/24/2014   HCT 42.0 12/24/2014   MCV 90.3 12/24/2014   PLT 191 12/24/2014   NEUTROABS 9.2* 12/24/2014     RADIOGRAPHIC STUDIES: I have personally reviewed the radiology reports and agreed with their findings. Dg Chest Port 1 View  12/23/2014   CLINICAL DATA:  Porta catheter placement  EXAM: PORTABLE CHEST - 1 VIEW  COMPARISON:  03/13/2005  FINDINGS: Left subclavian porta catheter with tip at the upper cavoatrial junction. No  pneumothorax.  No cardiomegaly for technique. Negative aortic and hilar contours. There is no edema, consolidation, or effusion.  IMPRESSION: No complicating features after left porta catheter placement.   Electronically Signed   By: Monte Fantasia M.D.   On: 12/23/2014 14:49   Dg Fluoro Guide Cv Line-no Report  12/23/2014   CLINICAL DATA:    FLOURO GUIDE CV LINE  Fluoroscopy was utilized by the requesting physician.  No radiographic  interpretation.      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 Treatment plan: 1. Neo-adjuvant chemotherapy with dose dense Adriamycin and Cytoxan 4 followed by Abraxane and carboplatin once a week 12 2. Followed by repeat breast MRI followed by surgery 3. Followed by radiation -------------------------------------------------------------------------------------------------------------------------------------------------- Current treatment: Cycle 1 day 1 dose dense Adriamycin and Cytoxan  consent has been obtained and patient is ready for chemotherapy  Chemotherapy monitoring: 1. Port has been placed 2. Echocardiogram 12/24/2014 EF 60-65% 3. Chemotherapy classes been completed 4. Patient filled her antiemetics  Return to clinic in 1 week for toxicity check   No orders of the defined types were placed in this encounter.   The patient has a good understanding of the overall plan. she agrees with it. she will call with any problems that may develop before the next visit here.   Rulon Eisenmenger, MD

## 2014-12-25 ENCOUNTER — Encounter: Payer: BLUE CROSS/BLUE SHIELD | Admitting: Genetic Counselor

## 2014-12-25 ENCOUNTER — Telehealth: Payer: Self-pay

## 2014-12-25 ENCOUNTER — Other Ambulatory Visit: Payer: BLUE CROSS/BLUE SHIELD

## 2014-12-25 NOTE — Telephone Encounter (Signed)
Attempted chemotherapy follow up call.  No answer - pt voice mail box not set up.

## 2014-12-25 NOTE — Progress Notes (Signed)
Spiritual Care Note  Met with Araly and her sisters several times throughout the day, visiting at length in infusion room to offer pastoral presence and reflective listening.  They were particularly receptive to humor and used opportunity to share and process feelings.  Per pt, her anxiety is decreasing as she approaches a tx routine (in contrast to the bombardment of appointments upon dx).  Family verbalized appreciation for chaplain support and are aware of ongoing chaplain availability.  Please also page as needs arise.  Thank you.  [place], [place] Pager [phone] Voicemail  [phone]

## 2014-12-26 ENCOUNTER — Telehealth: Payer: Self-pay | Admitting: *Deleted

## 2014-12-26 NOTE — Telephone Encounter (Signed)
1st AC. Patient states that she is eating and drinking well, denies any diarrhea. States she is tired and continues to work. Advised patient to call with any questions or concerns. She verbalized understanding.

## 2014-12-31 ENCOUNTER — Encounter: Payer: Self-pay | Admitting: Hematology and Oncology

## 2014-12-31 ENCOUNTER — Telehealth: Payer: Self-pay | Admitting: Hematology and Oncology

## 2014-12-31 ENCOUNTER — Ambulatory Visit (HOSPITAL_BASED_OUTPATIENT_CLINIC_OR_DEPARTMENT_OTHER): Payer: BLUE CROSS/BLUE SHIELD | Admitting: Hematology and Oncology

## 2014-12-31 ENCOUNTER — Other Ambulatory Visit (HOSPITAL_BASED_OUTPATIENT_CLINIC_OR_DEPARTMENT_OTHER): Payer: BLUE CROSS/BLUE SHIELD

## 2014-12-31 VITALS — BP 136/93 | HR 74 | Temp 98.1°F | Resp 18 | Ht 63.0 in | Wt 172.8 lb

## 2014-12-31 DIAGNOSIS — Z171 Estrogen receptor negative status [ER-]: Secondary | ICD-10-CM | POA: Diagnosis not present

## 2014-12-31 DIAGNOSIS — C50511 Malignant neoplasm of lower-outer quadrant of right female breast: Secondary | ICD-10-CM | POA: Diagnosis not present

## 2014-12-31 LAB — COMPREHENSIVE METABOLIC PANEL (CC13)
ALK PHOS: 86 U/L (ref 40–150)
ALT: 11 U/L (ref 0–55)
ANION GAP: 4 meq/L (ref 3–11)
AST: 10 U/L (ref 5–34)
Albumin: 3.3 g/dL — ABNORMAL LOW (ref 3.5–5.0)
BILIRUBIN TOTAL: 0.56 mg/dL (ref 0.20–1.20)
BUN: 9 mg/dL (ref 7.0–26.0)
CALCIUM: 8.4 mg/dL (ref 8.4–10.4)
CO2: 28 mEq/L (ref 22–29)
CREATININE: 0.7 mg/dL (ref 0.6–1.1)
Chloride: 107 mEq/L (ref 98–109)
EGFR: >90 mL/min/{1.73_m2} (ref >90)
Glucose: 113 mg/dl (ref 70–140)
Potassium: 3.9 mEq/L (ref 3.5–5.1)
SODIUM: 139 meq/L (ref 136–145)
TOTAL PROTEIN: 6.4 g/dL (ref 6.4–8.3)

## 2014-12-31 LAB — CBC WITH DIFFERENTIAL/PLATELET
BASO%: 0.9 % (ref 0.0–2.0)
Basophils Absolute: 0 10*3/uL (ref 0.0–0.1)
EOS%: 3 % (ref 0.0–7.0)
Eosinophils Absolute: 0.1 10*3/uL (ref 0.0–0.5)
HCT: 38.9 % (ref 34.8–46.6)
HGB: 12.9 g/dL (ref 11.6–15.9)
LYMPH%: 47 % (ref 14.0–49.7)
MCH: 30 pg (ref 25.1–34.0)
MCHC: 33.1 g/dL (ref 31.5–36.0)
MCV: 90.6 fL (ref 79.5–101.0)
MONO#: 0.1 10*3/uL (ref 0.1–0.9)
MONO%: 6.5 % (ref 0.0–14.0)
NEUT#: 0.9 10*3/uL — ABNORMAL LOW (ref 1.5–6.5)
NEUT%: 42.6 % (ref 38.4–76.8)
Platelets: 137 10*3/uL — ABNORMAL LOW (ref 145–400)
RBC: 4.29 10*6/uL (ref 3.70–5.45)
RDW: 12.3 % (ref 11.2–14.5)
WBC: 2.1 10*3/uL — ABNORMAL LOW (ref 3.9–10.3)
lymph#: 1 10*3/uL (ref 0.9–3.3)

## 2014-12-31 MED ORDER — VITAMIN D (ERGOCALCIFEROL) 1.25 MG (50000 UNIT) PO CAPS: 50000.0000 [IU] | ORAL_CAPSULE | ORAL | Status: DC

## 2014-12-31 MED ORDER — LACTULOSE 10 GM/15ML PO SOLN: 10.0000 g | Freq: Three times a day (TID) | ORAL | Status: DC

## 2014-12-31 NOTE — Progress Notes (Signed)
Patient Care Team: Secundino Ginger, [name], [name], [name], [name], [name], [name], NP as Nurse Practitioner (Nurse Practitioner)  DIAGNOSIS: Breast cancer of lower-outer quadrant of right female breast   Staging form: Breast, AJCC 7th Edition     Clinical stage from 12/11/2014: Stage IIA (T2, N0, M0) - Unsigned       Staging comments: Staged at breast conference on 7.20.16    SUMMARY OF ONCOLOGIC HISTORY:   Breast cancer of lower-outer quadrant of right female breast   12/03/2014 Mammogram Right breast mass 1.9 cm it o'clock position 8 cm depth from the nipple   12/03/2014 Initial Diagnosis Right breast biopsy 8:00: Invasive ductal carcinoma, grade 3, ER 0%, PR 0%, 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%, HER-2 negative ratio 1.43   12/10/2014 Breast MRI Right breast lower outer quadrant: 2.3 x 2.4 x 2.4 cm rim-enhancing mass abuts the pectoralis fascia but no enhancement of pectoralis muscle, second focus of artifact?'s second tissue marker clip, no lymph nodes   12/24/2014 -  Neo-Adjuvant Chemotherapy Dose dense Adriamycin and Cytoxan 4 followed by weekly Abraxane and carboplatin 12    CHIEF COMPLIANT: Cycle 1 day dose dense Adriamycin Cytoxan  INTERVAL HISTORY: Kaitlyn Keith is a 49 year old with above-mentioned history of right breast cancer who is here for toxicity check of cycle 1 day 8 Adriamycin and Cytoxan neo-adjuvant chemotherapy. Patient expressed difficulty with sleeping and constipation which did not resolve with Maalox or Dulcolax.  REVIEW OF SYSTEMS:   Constitutional: Denies fevers, chills or abnormal weight loss Eyes: Denies blurriness of vision Ears, nose, mouth, throat, and face: Denies mucositis or sore throat Respiratory:  Denies cough, dyspnea or wheezes Cardiovascular: Denies palpitation, chest discomfort or lower extremity swelling Gastrointestinal:  Constipation causing bloating Skin: Denies abnormal skin rashes Lymphatics: Denies new lymphadenopathy or easy bruising Neurological:Denies numbness, tingling or new weaknesses Behavioral/Psych: Mood is stable, no new changes  All other systems were reviewed with the patient and are negative.  I have reviewed the past medical history, past surgical history, social history and family history with the patient and they are unchanged from previous note.  ALLERGIES:  has No Known Allergies.  MEDICATIONS:  Current Outpatient Prescriptions  Medication Sig Dispense Refill  . ALPRAZolam (XANAX) 1 MG tablet     . dexamethasone (DECADRON) 4 MG tablet Take 1 tablet (4 mg total) by mouth 2 (two) times daily. Take 2 tablets by mouth once a day on the day after chemotherapy and then take 2 tablets two times a day for 2 days. Take with food. 30 tablet 1  . lidocaine-prilocaine (EMLA) cream Apply to affected area once 30 g 3  . lisinopril-hydrochlorothiazide (PRINZIDE,ZESTORETIC) 20-12.5 MG per tablet     . LORazepam (ATIVAN) 0.5 MG tablet Take 1 tablet (0.5 mg total) by mouth every 6 (six) hours as needed (Nausea or vomiting). 30 tablet 0  . ondansetron (ZOFRAN) 8 MG tablet Take 1 tablet (8 mg total) by mouth 2 (two) times daily as needed. Start on the third day after chemotherapy. 30 tablet 1  . oxyCODONE-acetaminophen (ROXICET) 5-325 MG per tablet Take 1-2 tablets by mouth every 4 (four) hours as needed. 50 tablet 0  . prochlorperazine (COMPAZINE) 10 MG tablet Take 1 tablet (10 mg total) by mouth every 6 (six)  hours as needed (Nausea or vomiting). 30 tablet 1  . sertraline (ZOLOFT) 50 MG tablet     . Vitamin D, Ergocalciferol, (DRISDOL) 50000 UNITS CAPS capsule      No current facility-administered medications for this visit.    PHYSICAL EXAMINATION: ECOG PERFORMANCE  STATUS: 1 - Symptomatic but completely ambulatory  There were no vitals filed for this visit. There were no vitals filed for this visit.  GENERAL:alert, no distress and comfortable SKIN: skin color, texture, turgor are normal, no rashes or significant lesions EYES: normal, Conjunctiva are pink and non-injected, sclera clear OROPHARYNX:no exudate, no erythema and lips, buccal mucosa, and tongue normal  NECK: supple, thyroid normal size, non-tender, without nodularity LYMPH:  no palpable lymphadenopathy in the cervical, axillary or inguinal LUNGS: clear to auscultation and percussion with normal breathing effort HEART: regular rate & rhythm and no murmurs and no lower extremity edema ABDOMEN:abdomen soft, non-tender and normal bowel sounds Musculoskeletal:no cyanosis of digits and no clubbing  NEURO: alert & oriented x 3 with fluent speech, no focal motor/sensory deficits  LABORATORY DATA:  I have reviewed the data as listed   Chemistry      Component Value Date/Time   NA 140 12/24/2014 1123   K 3.8 12/24/2014 1123   CO2 28 12/24/2014 1123   BUN 8.3 12/24/2014 1123   CREATININE 0.7 12/24/2014 1123      Component Value Date/Time   CALCIUM 9.6 12/24/2014 1123   ALKPHOS 69 12/24/2014 1123   AST 15 12/24/2014 1123   ALT 10 12/24/2014 1123   BILITOT 0.61 12/24/2014 1123       Lab Results  Component Value Date   WBC 11.6* 12/24/2014   HGB 13.9 12/24/2014   HCT 42.0 12/24/2014   MCV 90.3 12/24/2014   PLT 191 12/24/2014   NEUTROABS 9.2* 12/24/2014   ASSESSMENT & PLAN:  Breast cancer of lower-outer quadrant of right female breast Right breast biopsy 12/03/2014 8:00: Invasive ductal carcinoma, grade 3, ER 0%, PR 0%, 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 Treatment plan: 1. Neo-adjuvant chemotherapy with dose dense Adriamycin and Cytoxan 4 followed by  Abraxane and carboplatin once a week 12 2. Followed by repeat breast MRI followed by surgery 3. Followed by radiation -------------------------------------------------------------------------------------------------------------------------------------------------- Current treatment: Cycle 1 day 8 dose dense Adriamycin and Cytoxan  consent has been obtained and patient is ready for chemotherapy  Chemotherapy toxicities: 1. Constipation: I prescribed lactulose 2. Fatigue related to chemotherapy day 3-5 3. Insomnia: I advised her to increase Ativan to 2 tablets at bedtime   Return to clinic in 1 week for cycle 2. I will see her back with cycle 3   No orders of the defined types were placed in this encounter.   The patient has a good understanding of the overall plan. she agrees with it. she will call with any problems that may develop before the next visit here.   Rulon Eisenmenger, MD

## 2014-12-31 NOTE — Telephone Encounter (Signed)
Appointments made and avs printed for patient °

## 2014-12-31 NOTE — Assessment & Plan Note (Signed)
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 Treatment plan: 1. Neo-adjuvant chemotherapy with dose dense Adriamycin and Cytoxan 4 followed by Abraxane and carboplatin once a week 12 2. Followed by repeat breast MRI followed by surgery 3. Followed by radiation -------------------------------------------------------------------------------------------------------------------------------------------------- Current treatment: Cycle 1 day 8 dose dense Adriamycin and Cytoxan  consent has been obtained and patient is ready for chemotherapy  Chemotherapy toxicities:  Return to clinic in 1 week for cycle 2. I will see her back with cycle 3

## 2014-12-31 NOTE — Progress Notes (Signed)
Pt is approved for the $1000 [employer] grant.  

## 2015-01-07 ENCOUNTER — Encounter: Payer: Self-pay | Admitting: *Deleted

## 2015-01-07 ENCOUNTER — Other Ambulatory Visit (HOSPITAL_BASED_OUTPATIENT_CLINIC_OR_DEPARTMENT_OTHER): Payer: BLUE CROSS/BLUE SHIELD

## 2015-01-07 ENCOUNTER — Other Ambulatory Visit: Payer: BLUE CROSS/BLUE SHIELD

## 2015-01-07 ENCOUNTER — Encounter: Payer: Self-pay | Admitting: Physician Assistant

## 2015-01-07 ENCOUNTER — Ambulatory Visit (HOSPITAL_BASED_OUTPATIENT_CLINIC_OR_DEPARTMENT_OTHER): Payer: BLUE CROSS/BLUE SHIELD

## 2015-01-07 ENCOUNTER — Ambulatory Visit: Payer: BLUE CROSS/BLUE SHIELD

## 2015-01-07 ENCOUNTER — Ambulatory Visit (HOSPITAL_BASED_OUTPATIENT_CLINIC_OR_DEPARTMENT_OTHER): Payer: BLUE CROSS/BLUE SHIELD | Admitting: Physician Assistant

## 2015-01-07 VITALS — BP 137/89 | HR 60 | Temp 98.7°F | Resp 18 | Ht 63.0 in | Wt 171.3 lb

## 2015-01-07 DIAGNOSIS — F419 Anxiety disorder, unspecified: Secondary | ICD-10-CM

## 2015-01-07 DIAGNOSIS — Z171 Estrogen receptor negative status [ER-]: Secondary | ICD-10-CM

## 2015-01-07 DIAGNOSIS — Z5189 Encounter for other specified aftercare: Secondary | ICD-10-CM | POA: Diagnosis not present

## 2015-01-07 DIAGNOSIS — C50511 Malignant neoplasm of lower-outer quadrant of right female breast: Secondary | ICD-10-CM

## 2015-01-07 DIAGNOSIS — Z5111 Encounter for antineoplastic chemotherapy: Secondary | ICD-10-CM

## 2015-01-07 LAB — COMPREHENSIVE METABOLIC PANEL (CC13)
ALT: 17 U/L (ref 0–55)
AST: 16 U/L (ref 5–34)
Albumin: 3.4 g/dL — ABNORMAL LOW (ref 3.5–5.0)
Alkaline Phosphatase: 91 U/L (ref 40–150)
Anion Gap: 7 mEq/L (ref 3–11)
BUN: 8.5 mg/dL (ref 7.0–26.0)
CHLORIDE: 109 meq/L (ref 98–109)
CO2: 26 mEq/L (ref 22–29)
Calcium: 8.7 mg/dL (ref 8.4–10.4)
Creatinine: 0.7 mg/dL (ref 0.6–1.1)
EGFR: >90 mL/min/{1.73_m2} (ref >90)
GLUCOSE: 97 mg/dL (ref 70–140)
POTASSIUM: 3.3 meq/L — AB (ref 3.5–5.1)
SODIUM: 142 meq/L (ref 136–145)
Total Bilirubin: 0.24 mg/dL (ref 0.20–1.20)
Total Protein: 6.6 g/dL (ref 6.4–8.3)

## 2015-01-07 LAB — CBC WITH DIFFERENTIAL/PLATELET
BASO%: 0.8 % (ref 0.0–2.0)
Basophils Absolute: 0.1 10*3/uL (ref 0.0–0.1)
EOS%: 0.4 % (ref 0.0–7.0)
Eosinophils Absolute: 0 10*3/uL (ref 0.0–0.5)
HCT: 38.5 % (ref 34.8–46.6)
HGB: 12.8 g/dL (ref 11.6–15.9)
LYMPH%: 18.4 % (ref 14.0–49.7)
MCH: 29.9 pg (ref 25.1–34.0)
MCHC: 33.2 g/dL (ref 31.5–36.0)
MCV: 90.1 fL (ref 79.5–101.0)
MONO#: 0.4 10*3/uL (ref 0.1–0.9)
MONO%: 5.9 % (ref 0.0–14.0)
NEUT#: 5.3 10*3/uL (ref 1.5–6.5)
NEUT%: 74.5 % (ref 38.4–76.8)
Platelets: 179 10*3/uL (ref 145–400)
RBC: 4.27 10*6/uL (ref 3.70–5.45)
RDW: 12.6 % (ref 11.2–14.5)
WBC: 7.1 10*3/uL (ref 3.9–10.3)
lymph#: 1.3 10*3/uL (ref 0.9–3.3)

## 2015-01-07 MED ORDER — SODIUM CHLORIDE 0.9 % IJ SOLN
10.0000 mL | INTRAMUSCULAR | Status: DC | PRN
  Administered 2015-01-07: 10 mL
  Filled 2015-01-07: qty 10

## 2015-01-07 MED ORDER — LORAZEPAM 0.5 MG PO TABS: 0.5000 mg | ORAL_TABLET | Freq: Four times a day (QID) | ORAL | Status: DC | PRN

## 2015-01-07 MED ORDER — SODIUM CHLORIDE 0.9 % IV SOLN
Freq: Once | INTRAVENOUS | Status: AC
  Administered 2015-01-07: 16:00:00 via INTRAVENOUS
  Filled 2015-01-07: qty 5

## 2015-01-07 MED ORDER — PALONOSETRON HCL INJECTION 0.25 MG/5ML
INTRAVENOUS | Status: AC
  Filled 2015-01-07: qty 5

## 2015-01-07 MED ORDER — HEPARIN SOD (PORK) LOCK FLUSH 100 UNIT/ML IV SOLN
500.0000 [IU] | Freq: Once | INTRAVENOUS | Status: AC | PRN
  Administered 2015-01-07: 500 [IU]
  Filled 2015-01-07: qty 5

## 2015-01-07 MED ORDER — SODIUM CHLORIDE 0.9 % IV SOLN
600.0000 mg/m2 | Freq: Once | INTRAVENOUS | Status: AC
  Administered 2015-01-07: 1120 mg via INTRAVENOUS
  Filled 2015-01-07: qty 56

## 2015-01-07 MED ORDER — LORAZEPAM 1 MG PO TABS: 0.5000 mg | ORAL_TABLET | Freq: Once | ORAL | Status: DC

## 2015-01-07 MED ORDER — DOXORUBICIN HCL CHEMO IV INJECTION 2 MG/ML
60.0000 mg/m2 | Freq: Once | INTRAVENOUS | Status: AC
  Administered 2015-01-07: 112 mg via INTRAVENOUS
  Filled 2015-01-07: qty 56

## 2015-01-07 MED ORDER — SODIUM CHLORIDE 0.9 % IV SOLN
Freq: Once | INTRAVENOUS | Status: AC
  Administered 2015-01-07: 16:00:00 via INTRAVENOUS

## 2015-01-07 MED ORDER — PEGFILGRASTIM 6 MG/0.6ML ~~LOC~~ PSKT
6.0000 mg | PREFILLED_SYRINGE | Freq: Once | SUBCUTANEOUS | Status: AC
  Administered 2015-01-07: 6 mg via SUBCUTANEOUS
  Filled 2015-01-07: qty 0.6

## 2015-01-07 MED ORDER — PALONOSETRON HCL INJECTION 0.25 MG/5ML
0.2500 mg | Freq: Once | INTRAVENOUS | Status: AC
  Administered 2015-01-07: 0.25 mg via INTRAVENOUS

## 2015-01-07 NOTE — Progress Notes (Signed)
Patient Care Team: Secundino Ginger, [name], [name], [name], [name], [name], [name], NP as Nurse Practitioner (Nurse Practitioner)  DIAGNOSIS: Breast cancer of lower-outer quadrant of right female breast   Staging form: Breast, AJCC 7th Edition     Clinical stage from 12/11/2014: Stage IIA (T2, N0, M0) - Unsigned       Staging comments: Staged at breast conference on 7.20.16    SUMMARY OF ONCOLOGIC HISTORY:   Breast cancer of lower-outer quadrant of right female breast   12/03/2014 Mammogram Right breast mass 1.9 cm it o'clock position 8 cm depth from the nipple   12/03/2014 Initial Diagnosis Right breast biopsy 8:00: Invasive ductal carcinoma, grade 3, ER 0%, PR 0%, 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%, HER-2 negative ratio 1.43   12/10/2014 Breast MRI Right breast lower outer quadrant: 2.3 x 2.4 x 2.4 cm rim-enhancing mass abuts the pectoralis fascia but no enhancement of pectoralis muscle, second focus of artifact?'s second tissue marker clip, no lymph nodes   12/24/2014 -  Neo-Adjuvant Chemotherapy Dose dense Adriamycin and Cytoxan 4 followed by weekly Abraxane and carboplatin 12    CHIEF COMPLIANT: Cycle 1 day dose dense Adriamycin Cytoxan  INTERVAL HISTORY: Kaitlyn Keith is a 49 year old with above-mentioned history of right breast cancer who is here for toxicity check of cycle 2 day 1 Adriamycin and Cytoxan neo-adjuvant chemotherapy. She tolerated her first cycle relatively well with the exception of some nausea and vomiting (anticipatory) and anxiety. She voices no specific complaints today and is ready to proceed with cycle #2 of her chemotherapy with dose dense Adriamycin and Cytoxan.   REVIEW OF SYSTEMS:   Constitutional: Denies  fevers, chills or abnormal weight loss Eyes: Denies blurriness of vision Ears, nose, mouth, throat, and face: Denies mucositis or sore throat Respiratory: Denies cough, dyspnea or wheezes Cardiovascular: Denies palpitation, chest discomfort or lower extremity swelling Gastrointestinal:  Constipation causing bloating Skin: Denies abnormal skin rashes Lymphatics: Denies new lymphadenopathy or easy bruising Neurological:Denies numbness, tingling or new weaknesses Behavioral/Psych: Mood is stable, no new changes  All other systems were reviewed with the patient and are negative.  I have reviewed the past medical history, past surgical history, social history and family history with the patient and they are unchanged from previous note.  ALLERGIES:  has No Known Allergies.  MEDICATIONS:  Current Outpatient Prescriptions  Medication Sig Dispense Refill  . ALPRAZolam (XANAX) 1 MG tablet     . dexamethasone (DECADRON) 4 MG tablet Take 1 tablet (4 mg total) by mouth 2 (two) times daily. Take 2 tablets by mouth once a day on the day after chemotherapy and then take 2 tablets two times a day for 2 days. Take with food. 30 tablet 1  . lactulose (CHRONULAC) 10 GM/15ML solution Take 15 mLs (10 g total) by mouth 3 (three) times daily. 240 mL 0  . lidocaine-prilocaine (EMLA) cream Apply to affected area once 30 g 3  . lisinopril-hydrochlorothiazide (PRINZIDE,ZESTORETIC) 20-12.5 MG per tablet     . LORazepam (ATIVAN) 0.5 MG tablet Take 1 tablet (0.5 mg total) by mouth every 6 (six) hours as needed (Nausea or vomiting). 30 tablet 0  . ondansetron (ZOFRAN) 8 MG tablet Take 1 tablet (8 mg total) by mouth 2 (two) times daily as needed. Start  on the third day after chemotherapy. 30 tablet 1  . oxyCODONE-acetaminophen (ROXICET) 5-325 MG per tablet Take 1-2 tablets by mouth every 4 (four) hours as needed. 50 tablet 0  . prochlorperazine (COMPAZINE) 10 MG tablet Take 1 tablet (10 mg total) by mouth every 6 (six)  hours as needed (Nausea or vomiting). 30 tablet 1  . sertraline (ZOLOFT) 50 MG tablet     . Vitamin D, Ergocalciferol, (DRISDOL) 50000 UNITS CAPS capsule Take 1 capsule (50,000 Units total) by mouth every 7 (seven) days. 12 capsule 3   Current Facility-Administered Medications  Medication Dose Route Frequency Provider Last Rate Last Dose  . LORazepam (ATIVAN) tablet 0.5 mg  0.5 mg Oral Once Emireth Cockerham E Kairah Leoni, PA-C        PHYSICAL EXAMINATION: ECOG PERFORMANCE STATUS: 1 - Symptomatic but completely ambulatory  Filed Vitals:   01/07/15 1400  BP: 137/89  Pulse: 60  Temp: 98.7 F (37.1 C)  Resp: 18   Filed Weights   01/07/15 1400  Weight: 171 lb 4.8 oz (77.701 kg)    GENERAL:alert, no distress and comfortable SKIN: skin color, texture, turgor are normal, no rashes or significant lesions EYES: normal, Conjunctiva are pink and non-injected, sclera clear OROPHARYNX:no exudate, no erythema and lips, buccal mucosa, and tongue normal  NECK: supple, thyroid normal size, non-tender, without nodularity LYMPH:  no palpable lymphadenopathy in the cervical, axillary or inguinal LUNGS: clear to auscultation and percussion with normal breathing effort HEART: regular rate & rhythm and no murmurs and no lower extremity edema ABDOMEN:abdomen soft, non-tender and normal bowel sounds Musculoskeletal:no cyanosis of digits and no clubbing  NEURO: alert & oriented x 3 with fluent speech, no focal motor/sensory deficits  LABORATORY DATA:  I have reviewed the data as listed   Chemistry      Component Value Date/Time   NA 142 01/07/2015 1339   K 3.3* 01/07/2015 1339   CO2 26 01/07/2015 1339   BUN 8.5 01/07/2015 1339   CREATININE 0.7 01/07/2015 1339      Component Value Date/Time   CALCIUM 8.7 01/07/2015 1339   ALKPHOS 91 01/07/2015 1339   AST 16 01/07/2015 1339   ALT 17 01/07/2015 1339   BILITOT 0.24 01/07/2015 1339       Lab Results  Component Value Date   WBC 7.1 01/07/2015   HGB  12.8 01/07/2015   HCT 38.5 01/07/2015   MCV 90.1 01/07/2015   PLT 179 01/07/2015   NEUTROABS 5.3 01/07/2015   ASSESSMENT & PLAN:  Breast cancer of lower-outer quadrant of right female breast Right breast biopsy 12/03/2014 8:00: Invasive ductal carcinoma, grade 3, ER 0%, PR 0%, 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 Treatment plan: 1. Neo-adjuvant chemotherapy with dose dense Adriamycin and Cytoxan 4 followed by Abraxane and carboplatin once a week 12 2. Followed by repeat breast MRI followed by surgery 3. Followed by radiation -------------------------------------------------------------------------------------------------------------------------------------------------- Current treatment: Cycle 2 day 1 dose dense Adriamycin and Cytoxan  consent has been obtained and patient is ready for chemotherapy  Chemotherapy toxicities: 1. Constipation: resolved 2. Fatigue related to chemotherapy day 3-5 3. Insomnia: Continue Ativan 2 tablets at bedtime as advised by Dr Lindi Adie 4. Anxiety: This seems to be related to her chemotherapy. I will give her 0.5 Ativan by mouth today prior to initiation of cycle #2. I advised her to take 0.5  mg of Ativan about 30 minutes to an hour before her next 2 cycles of chemotherapy. She voiced understanding.   Return to clinic in 2 weeks for cycle 3.    No orders of the defined types were placed in this encounter.   The patient has a good understanding of the overall plan. she agrees with it. she will call with any problems that may develop before the next visit here.   Wynetta Emery, Ramie Palladino E, PA-C

## 2015-01-07 NOTE — Patient Instructions (Signed)
[facility] Discharge Instructions for Patients Receiving Chemotherapy  Today you received the following chemotherapy agents: Adriamycin/Cytoxan.  To help prevent nausea and vomiting after your treatment, we encourage you to take your nausea medication as directed.    If you develop nausea and vomiting that is not controlled by your nausea medication, call the clinic.   BELOW ARE SYMPTOMS THAT SHOULD BE REPORTED IMMEDIATELY:  *FEVER GREATER THAN 100.5 F  *CHILLS WITH OR WITHOUT FEVER  NAUSEA AND VOMITING THAT IS NOT CONTROLLED WITH YOUR NAUSEA MEDICATION  *UNUSUAL SHORTNESS OF BREATH  *UNUSUAL BRUISING OR BLEEDING  TENDERNESS IN MOUTH AND THROAT WITH OR WITHOUT PRESENCE OF ULCERS  *URINARY PROBLEMS  *BOWEL PROBLEMS  UNUSUAL RASH Items with * indicate a potential emergency and should be followed up as soon as possible.  Feel free to call the clinic you have any questions or concerns. The clinic phone number is [phone].  Please show the CHEMO ALERT CARD at check-in to the Emergency Department and triage nurse.   

## 2015-01-08 ENCOUNTER — Telehealth: Payer: Self-pay | Admitting: Hematology and Oncology

## 2015-01-08 NOTE — Telephone Encounter (Signed)
Spoke with patient and she is aware of her new appointment °

## 2015-01-10 NOTE — Patient Instructions (Signed)
Take Ativan 0.5 mg proximal be 30 minutes to an hour before your next 2 cycles of chemotherapy as we discussed Follow-up in 2 weeks prior to the start of cycle #3

## 2015-01-15 ENCOUNTER — Encounter: Payer: Self-pay | Admitting: Hematology and Oncology

## 2015-01-15 NOTE — Progress Notes (Signed)
I placed fmla form on desk of nurse for dr. Lindi Adie

## 2015-01-16 ENCOUNTER — Encounter: Payer: Self-pay | Admitting: Hematology and Oncology

## 2015-01-16 NOTE — Progress Notes (Signed)
Per patient fax her copy [phone] and I faxed [facility]  [phone]

## 2015-01-21 ENCOUNTER — Ambulatory Visit (HOSPITAL_BASED_OUTPATIENT_CLINIC_OR_DEPARTMENT_OTHER): Payer: BLUE CROSS/BLUE SHIELD | Admitting: Hematology and Oncology

## 2015-01-21 ENCOUNTER — Other Ambulatory Visit: Payer: Self-pay | Admitting: *Deleted

## 2015-01-21 ENCOUNTER — Other Ambulatory Visit (HOSPITAL_BASED_OUTPATIENT_CLINIC_OR_DEPARTMENT_OTHER): Payer: BLUE CROSS/BLUE SHIELD

## 2015-01-21 ENCOUNTER — Ambulatory Visit: Payer: BLUE CROSS/BLUE SHIELD

## 2015-01-21 ENCOUNTER — Other Ambulatory Visit: Payer: BLUE CROSS/BLUE SHIELD

## 2015-01-21 ENCOUNTER — Encounter: Payer: Self-pay | Admitting: Hematology and Oncology

## 2015-01-21 ENCOUNTER — Ambulatory Visit (HOSPITAL_BASED_OUTPATIENT_CLINIC_OR_DEPARTMENT_OTHER): Payer: BLUE CROSS/BLUE SHIELD

## 2015-01-21 VITALS — BP 143/88 | HR 55 | Temp 98.2°F | Resp 18 | Ht 63.0 in | Wt 169.4 lb

## 2015-01-21 DIAGNOSIS — C50511 Malignant neoplasm of lower-outer quadrant of right female breast: Secondary | ICD-10-CM

## 2015-01-21 DIAGNOSIS — R5383 Other fatigue: Secondary | ICD-10-CM | POA: Diagnosis not present

## 2015-01-21 DIAGNOSIS — Z5111 Encounter for antineoplastic chemotherapy: Secondary | ICD-10-CM | POA: Diagnosis not present

## 2015-01-21 DIAGNOSIS — Z5189 Encounter for other specified aftercare: Secondary | ICD-10-CM | POA: Diagnosis not present

## 2015-01-21 DIAGNOSIS — Z171 Estrogen receptor negative status [ER-]: Secondary | ICD-10-CM

## 2015-01-21 DIAGNOSIS — R112 Nausea with vomiting, unspecified: Secondary | ICD-10-CM

## 2015-01-21 LAB — CBC WITH DIFFERENTIAL/PLATELET
BASO%: 0.6 % (ref 0.0–2.0)
Basophils Absolute: 0.1 10*3/uL (ref 0.0–0.1)
EOS%: 0.1 % (ref 0.0–7.0)
Eosinophils Absolute: 0 10*3/uL (ref 0.0–0.5)
HEMATOCRIT: 37.8 % (ref 34.8–46.6)
HEMOGLOBIN: 12.5 g/dL (ref 11.6–15.9)
LYMPH#: 1.4 10*3/uL (ref 0.9–3.3)
LYMPH%: 14.1 % (ref 14.0–49.7)
MCH: 29.8 pg (ref 25.1–34.0)
MCHC: 33.1 g/dL (ref 31.5–36.0)
MCV: 90 fL (ref 79.5–101.0)
MONO#: 0.6 10*3/uL (ref 0.1–0.9)
MONO%: 6.3 % (ref 0.0–14.0)
NEUT%: 78.9 % — ABNORMAL HIGH (ref 38.4–76.8)
NEUTROS ABS: 7.9 10*3/uL — AB (ref 1.5–6.5)
Platelets: 143 10*3/uL — ABNORMAL LOW (ref 145–400)
RBC: 4.2 10*6/uL (ref 3.70–5.45)
RDW: 12.1 % (ref 11.2–14.5)
WBC: 10 10*3/uL (ref 3.9–10.3)

## 2015-01-21 LAB — COMPREHENSIVE METABOLIC PANEL (CC13)
ALBUMIN: 3.6 g/dL (ref 3.5–5.0)
ALK PHOS: 84 U/L (ref 40–150)
ALT: 11 U/L (ref 0–55)
AST: 13 U/L (ref 5–34)
Anion Gap: 8 mEq/L (ref 3–11)
BUN: 10.2 mg/dL (ref 7.0–26.0)
CO2: 26 mEq/L (ref 22–29)
CREATININE: 0.7 mg/dL (ref 0.6–1.1)
Calcium: 9.1 mg/dL (ref 8.4–10.4)
Chloride: 105 mEq/L (ref 98–109)
EGFR: >90 mL/min/{1.73_m2} (ref >90)
GLUCOSE: 84 mg/dL (ref 70–140)
POTASSIUM: 3.6 meq/L (ref 3.5–5.1)
SODIUM: 139 meq/L (ref 136–145)
TOTAL PROTEIN: 6.7 g/dL (ref 6.4–8.3)
Total Bilirubin: 0.23 mg/dL (ref 0.20–1.20)

## 2015-01-21 MED ORDER — PALONOSETRON HCL INJECTION 0.25 MG/5ML
INTRAVENOUS | Status: AC
  Filled 2015-01-21: qty 5

## 2015-01-21 MED ORDER — SODIUM CHLORIDE 0.9 % IV SOLN
Freq: Once | INTRAVENOUS | Status: AC
  Administered 2015-01-21: 15:00:00 via INTRAVENOUS

## 2015-01-21 MED ORDER — PALONOSETRON HCL INJECTION 0.25 MG/5ML
0.2500 mg | Freq: Once | INTRAVENOUS | Status: AC
  Administered 2015-01-21: 0.25 mg via INTRAVENOUS

## 2015-01-21 MED ORDER — LORAZEPAM 1 MG PO TABS: 1.0000 mg | ORAL_TABLET | Freq: Four times a day (QID) | ORAL | Status: DC | PRN

## 2015-01-21 MED ORDER — SODIUM CHLORIDE 0.9 % IJ SOLN
10.0000 mL | INTRAMUSCULAR | Status: DC | PRN
  Administered 2015-01-21: 10 mL
  Filled 2015-01-21: qty 10

## 2015-01-21 MED ORDER — SODIUM CHLORIDE 0.9 % IV SOLN
Freq: Once | INTRAVENOUS | Status: AC
  Administered 2015-01-21: 15:00:00 via INTRAVENOUS
  Filled 2015-01-21: qty 5

## 2015-01-21 MED ORDER — VITAMIN D (ERGOCALCIFEROL) 1.25 MG (50000 UNIT) PO CAPS: 50000.0000 [IU] | ORAL_CAPSULE | ORAL | Status: DC

## 2015-01-21 MED ORDER — DEXAMETHASONE 4 MG PO TABS: 4.0000 mg | ORAL_TABLET | Freq: Two times a day (BID) | ORAL | Status: DC

## 2015-01-21 MED ORDER — SODIUM CHLORIDE 0.9 % IV SOLN
600.0000 mg/m2 | Freq: Once | INTRAVENOUS | Status: AC
  Administered 2015-01-21: 1120 mg via INTRAVENOUS
  Filled 2015-01-21: qty 56

## 2015-01-21 MED ORDER — PEGFILGRASTIM 6 MG/0.6ML ~~LOC~~ PSKT
6.0000 mg | PREFILLED_SYRINGE | Freq: Once | SUBCUTANEOUS | Status: AC
  Administered 2015-01-21: 6 mg via SUBCUTANEOUS
  Filled 2015-01-21: qty 0.6

## 2015-01-21 MED ORDER — DOXORUBICIN HCL CHEMO IV INJECTION 2 MG/ML
60.0000 mg/m2 | Freq: Once | INTRAVENOUS | Status: AC
  Administered 2015-01-21: 112 mg via INTRAVENOUS
  Filled 2015-01-21: qty 56

## 2015-01-21 MED ORDER — HEPARIN SOD (PORK) LOCK FLUSH 100 UNIT/ML IV SOLN
500.0000 [IU] | Freq: Once | INTRAVENOUS | Status: AC | PRN
  Administered 2015-01-21: 500 [IU]
  Filled 2015-01-21: qty 5

## 2015-01-21 MED ORDER — DEXAMETHASONE 4 MG PO TABS: ORAL_TABLET | ORAL | Status: DC

## 2015-01-21 MED ORDER — LISINOPRIL-HYDROCHLOROTHIAZIDE 20-12.5 MG PO TABS: 1.0000 | ORAL_TABLET | Freq: Two times a day (BID) | ORAL | Status: DC

## 2015-01-21 NOTE — Progress Notes (Signed)
Patient Care Team: Secundino Ginger, [name], [name], [name], [name], [name], [name], NP as Nurse Practitioner (Nurse Practitioner)  DIAGNOSIS: Breast cancer of lower-outer quadrant of right female breast   Staging form: Breast, AJCC 7th Edition     Clinical stage from 12/11/2014: Stage IIA (T2, N0, M0) - Unsigned       Staging comments: Staged at breast conference on 7.20.16    SUMMARY OF ONCOLOGIC HISTORY:   Breast cancer of lower-outer quadrant of right female breast   12/03/2014 Mammogram Right breast mass 1.9 cm it o'clock position 8 cm depth from the nipple   12/03/2014 Initial Diagnosis Right breast biopsy 8:00: Invasive ductal carcinoma, grade 3, ER 0%, PR 0%, 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%, HER-2 negative ratio 1.43   12/10/2014 Breast MRI Right breast lower outer quadrant: 2.3 x 2.4 x 2.4 cm rim-enhancing mass abuts the pectoralis fascia but no enhancement of pectoralis muscle, second focus of artifact?'s second tissue marker clip, no lymph nodes   12/24/2014 -  Neo-Adjuvant Chemotherapy Dose dense Adriamycin and Cytoxan 4 followed by weekly Abraxane and carboplatin 12    CHIEF COMPLIANT: Cycle 3 of Adriamycin and Cytoxan  INTERVAL HISTORY: Kaitlyn Keith is a 46 over the above-mentioned history of right breast cancer continue adjuvant chemotherapy with dose dense Adriamycin Cytoxan. Today is cycle #3. She reports that she had one episode of nausea vomiting during the chemotherapy related to anxiety. She has noticed small mouth sores which have not healed. Her appetite is pretty poor. She is able to eat well. She has one episode of urinary discomfort which has resolved. She has constipation for which she was lactulose but it led to  severe bloating and abdominal discomfort.  REVIEW OF SYSTEMS:   Constitutional: Denies fevers, chills or abnormal weight loss Eyes: Denies blurriness of vision Ears, nose, mouth, throat, and face: Denies mucositis or sore throat Respiratory: Denies cough, dyspnea or wheezes Cardiovascular: Denies palpitation, chest discomfort or lower extremity swelling Gastrointestinal: Constipation and nausea and constipation Skin: Denies abnormal skin rashes Lymphatics: Denies new lymphadenopathy or easy bruising Neurological:Denies numbness, tingling or new weaknesses Behavioral/Psych: Mood is stable, no new changes  All other systems were reviewed with the patient and are negative.  I have reviewed the past medical history, past surgical history, social history and family history with the patient and they are unchanged from previous note.  ALLERGIES:  has No Known Allergies.  MEDICATIONS:  Current Outpatient Prescriptions  Medication Sig Dispense Refill  . ALPRAZolam (XANAX) 1 MG tablet     . lactulose (CHRONULAC) 10 GM/15ML solution Take 15 mLs (10 g total) by mouth 3 (three) times daily. 240 mL 0  . lidocaine-prilocaine (EMLA) cream Apply to affected area once 30 g 3  . lisinopril-hydrochlorothiazide (PRINZIDE,ZESTORETIC) 20-12.5 MG per tablet     . LORazepam (ATIVAN) 0.5 MG tablet Take 1 tablet (0.5 mg total) by mouth every 6 (six) hours as needed (Nausea or vomiting). 30 tablet 0  . ondansetron (ZOFRAN) 8 MG tablet Take 1 tablet (8 mg total) by mouth 2 (two) times daily as needed. Start on the third day after chemotherapy. 30 tablet 1  . oxyCODONE-acetaminophen (ROXICET) 5-325 MG per tablet Take 1-2 tablets by mouth every 4 (four) hours as needed. 50 tablet 0  .  prochlorperazine (COMPAZINE) 10 MG tablet Take 1 tablet (10 mg total) by mouth every 6 (six) hours as needed (Nausea or vomiting). 30 tablet 1  . sertraline (ZOLOFT) 50 MG tablet     . dexamethasone (DECADRON) 4 MG tablet Take 1 tablet  (4 mg total) by mouth 2 (two) times daily. Take 2 tablets by mouth once a day on the day after chemotherapy and then take 2 tablets two times a day for 2 days. Take with food. 30 tablet 1  . Vitamin D, Ergocalciferol, (DRISDOL) 50000 UNITS CAPS capsule Take 1 capsule (50,000 Units total) by mouth every 7 (seven) days. 12 capsule 3   No current facility-administered medications for this visit.    PHYSICAL EXAMINATION: ECOG PERFORMANCE STATUS: 1 - Symptomatic but completely ambulatory  Filed Vitals:   01/21/15 1404  BP: 143/88  Pulse: 55  Temp: 98.2 F (36.8 C)  Resp: 18   Filed Weights   01/21/15 1404  Weight: 169 lb 6.4 oz (76.839 kg)    GENERAL:alert, no distress and comfortable SKIN: skin color, texture, turgor are normal, no rashes or significant lesions EYES: normal, Conjunctiva are pink and non-injected, sclera clear OROPHARYNX:no exudate, no erythema and lips, buccal mucosa, and tongue normal  NECK: supple, thyroid normal size, non-tender, without nodularity LYMPH:  no palpable lymphadenopathy in the cervical, axillary or inguinal LUNGS: clear to auscultation and percussion with normal breathing effort HEART: regular rate & rhythm and no murmurs and no lower extremity edema ABDOMEN:abdomen soft, non-tender and normal bowel sounds Musculoskeletal:no cyanosis of digits and no clubbing  NEURO: alert & oriented x 3 with fluent speech, no focal motor/sensory deficits  LABORATORY DATA:  I have reviewed the data as listed   Chemistry      Component Value Date/Time   NA 139 01/21/2015 1335   K 3.6 01/21/2015 1335   CO2 26 01/21/2015 1335   BUN 10.2 01/21/2015 1335   CREATININE 0.7 01/21/2015 1335      Component Value Date/Time   CALCIUM 9.1 01/21/2015 1335   ALKPHOS 84 01/21/2015 1335   AST 13 01/21/2015 1335   ALT 11 01/21/2015 1335   BILITOT 0.23 01/21/2015 1335       Lab Results  Component Value Date   WBC 10.0 01/21/2015   HGB 12.5 01/21/2015   HCT 37.8  01/21/2015   MCV 90.0 01/21/2015   PLT 143* 01/21/2015   NEUTROABS 7.9* 01/21/2015   ASSESSMENT & PLAN:  Breast cancer of lower-outer quadrant of right female breast Right breast biopsy 12/03/2014 8:00: Invasive ductal carcinoma, grade 3, ER 0%, PR 0%, 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 Treatment plan: 1. Neo-adjuvant chemotherapy with dose dense Adriamycin and Cytoxan 4 followed by Abraxane and carboplatin once a week 12 2. Followed by repeat breast MRI followed by surgery 3. Followed by radiation -------------------------------------------------------------------------------------------------------------------------------------------------- Current treatment: Cycle 3 day 1 dose dense Adriamycin and Cytoxan   Chemotherapy toxicities: 1. Constipation: resolved 2. Fatigue related to chemotherapy day 3-5 3. Insomnia: Continue Ativan 2 tablets at bedtime. Renewed 1 mg daily 4. Anxiety:  5. Nausea and vomiting 1 6. Mouth sores: Much improved   Patient requests refill on dexamethasone, lisinopril and Ativan today. Return to clinic in 2 weeks for cycle 4.   No orders of the defined types were placed in this encounter.   The patient has  a good understanding of the overall plan. she agrees with it. she will call with any problems that may develop before the next visit here.   Rulon Eisenmenger, MD

## 2015-01-21 NOTE — Patient Instructions (Signed)
[facility] Discharge Instructions for Patients Receiving Chemotherapy  Today you received the following chemotherapy agents adriamycin/cytoxan  To help prevent nausea and vomiting after your treatment, we encourage you to take your nausea medication as directed  If you develop nausea and vomiting that is not controlled by your nausea medication, call the clinic.   BELOW ARE SYMPTOMS THAT SHOULD BE REPORTED IMMEDIATELY:  *FEVER GREATER THAN 100.5 F  *CHILLS WITH OR WITHOUT FEVER  NAUSEA AND VOMITING THAT IS NOT CONTROLLED WITH YOUR NAUSEA MEDICATION  *UNUSUAL SHORTNESS OF BREATH  *UNUSUAL BRUISING OR BLEEDING  TENDERNESS IN MOUTH AND THROAT WITH OR WITHOUT PRESENCE OF ULCERS  *URINARY PROBLEMS  *BOWEL PROBLEMS  UNUSUAL RASH Items with * indicate a potential emergency and should be followed up as soon as possible.  Feel free to call the clinic you have any questions or concerns. The clinic phone number is [phone].  

## 2015-01-21 NOTE — Assessment & Plan Note (Signed)
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 Treatment plan: 1. Neo-adjuvant chemotherapy with dose dense Adriamycin and Cytoxan 4 followed by Abraxane and carboplatin once a week 12 2. Followed by repeat breast MRI followed by surgery 3. Followed by radiation -------------------------------------------------------------------------------------------------------------------------------------------------- Current treatment: Cycle 3 day 1 dose dense Adriamycin and Cytoxan  consent has been obtained and patient is ready for chemotherapy  Chemotherapy toxicities: 1. Constipation: resolved 2. Fatigue related to chemotherapy day 3-5 3. Insomnia: Continue Ativan 2 tablets at bedtime as advised by Dr Lindi Adie 4. Anxiety: This seems to be related to her chemotherapy. I will give her 0.5 Ativan by mouth today prior to initiation of cycle #2. I advised her to take 0.5 mg of Ativan about 30 minutes to an hour before her next 2 cycles of chemotherapy. She voiced understanding.  Return to clinic in 2 weeks for cycle 4.

## 2015-01-29 ENCOUNTER — Encounter: Payer: BLUE CROSS/BLUE SHIELD | Admitting: Genetic Counselor

## 2015-01-29 ENCOUNTER — Other Ambulatory Visit: Payer: BLUE CROSS/BLUE SHIELD

## 2015-02-04 ENCOUNTER — Ambulatory Visit (HOSPITAL_BASED_OUTPATIENT_CLINIC_OR_DEPARTMENT_OTHER): Payer: BLUE CROSS/BLUE SHIELD | Admitting: Nurse Practitioner

## 2015-02-04 ENCOUNTER — Other Ambulatory Visit (HOSPITAL_BASED_OUTPATIENT_CLINIC_OR_DEPARTMENT_OTHER): Payer: BLUE CROSS/BLUE SHIELD

## 2015-02-04 ENCOUNTER — Ambulatory Visit (HOSPITAL_BASED_OUTPATIENT_CLINIC_OR_DEPARTMENT_OTHER): Payer: BLUE CROSS/BLUE SHIELD

## 2015-02-04 ENCOUNTER — Other Ambulatory Visit: Payer: Self-pay | Admitting: Nurse Practitioner

## 2015-02-04 ENCOUNTER — Encounter: Payer: Self-pay | Admitting: Nurse Practitioner

## 2015-02-04 ENCOUNTER — Telehealth: Payer: Self-pay | Admitting: Hematology and Oncology

## 2015-02-04 ENCOUNTER — Other Ambulatory Visit: Payer: Self-pay | Admitting: Hematology and Oncology

## 2015-02-04 ENCOUNTER — Ambulatory Visit: Payer: BLUE CROSS/BLUE SHIELD

## 2015-02-04 ENCOUNTER — Encounter: Payer: Self-pay | Admitting: *Deleted

## 2015-02-04 VITALS — BP 137/83 | HR 51 | Temp 98.3°F | Resp 18 | Wt 169.5 lb

## 2015-02-04 DIAGNOSIS — Z171 Estrogen receptor negative status [ER-]: Secondary | ICD-10-CM

## 2015-02-04 DIAGNOSIS — L03032 Cellulitis of left toe: Secondary | ICD-10-CM | POA: Diagnosis not present

## 2015-02-04 DIAGNOSIS — Z5189 Encounter for other specified aftercare: Secondary | ICD-10-CM

## 2015-02-04 DIAGNOSIS — C50511 Malignant neoplasm of lower-outer quadrant of right female breast: Secondary | ICD-10-CM | POA: Diagnosis not present

## 2015-02-04 DIAGNOSIS — Z5111 Encounter for antineoplastic chemotherapy: Secondary | ICD-10-CM | POA: Diagnosis not present

## 2015-02-04 LAB — COMPREHENSIVE METABOLIC PANEL (CC13)
ALT: 10 U/L (ref 0–55)
AST: 13 U/L (ref 5–34)
Albumin: 3.6 g/dL (ref 3.5–5.0)
Alkaline Phosphatase: 88 U/L (ref 40–150)
Anion Gap: 6 mEq/L (ref 3–11)
BUN: 8.9 mg/dL (ref 7.0–26.0)
CHLORIDE: 108 meq/L (ref 98–109)
CO2: 27 meq/L (ref 22–29)
CREATININE: 0.8 mg/dL (ref 0.6–1.1)
Calcium: 9.1 mg/dL (ref 8.4–10.4)
EGFR: >90 mL/min/{1.73_m2} (ref >90)
GLUCOSE: 85 mg/dL (ref 70–140)
POTASSIUM: 3.7 meq/L (ref 3.5–5.1)
SODIUM: 140 meq/L (ref 136–145)
Total Bilirubin: 0.3 mg/dL (ref 0.20–1.20)
Total Protein: 6.7 g/dL (ref 6.4–8.3)

## 2015-02-04 LAB — CBC WITH DIFFERENTIAL/PLATELET
BASO%: 0.6 % (ref 0.0–2.0)
BASOS ABS: 0.1 10*3/uL (ref 0.0–0.1)
EOS%: 0.1 % (ref 0.0–7.0)
Eosinophils Absolute: 0 10*3/uL (ref 0.0–0.5)
HCT: 35.6 % (ref 34.8–46.6)
HGB: 11.7 g/dL (ref 11.6–15.9)
LYMPH#: 0.9 10*3/uL (ref 0.9–3.3)
LYMPH%: 7.7 % — ABNORMAL LOW (ref 14.0–49.7)
MCH: 29.1 pg (ref 25.1–34.0)
MCHC: 32.8 g/dL (ref 31.5–36.0)
MCV: 88.7 fL (ref 79.5–101.0)
MONO#: 0.7 10*3/uL (ref 0.1–0.9)
MONO%: 5.9 % (ref 0.0–14.0)
NEUT#: 9.8 10*3/uL — ABNORMAL HIGH (ref 1.5–6.5)
NEUT%: 85.7 % — AB (ref 38.4–76.8)
Platelets: 172 10*3/uL (ref 145–400)
RBC: 4.02 10*6/uL (ref 3.70–5.45)
RDW: 12.8 % (ref 11.2–14.5)
WBC: 11.5 10*3/uL — ABNORMAL HIGH (ref 3.9–10.3)

## 2015-02-04 MED ORDER — DOXORUBICIN HCL CHEMO IV INJECTION 2 MG/ML
60.0000 mg/m2 | Freq: Once | INTRAVENOUS | Status: AC
  Administered 2015-02-04: 112 mg via INTRAVENOUS
  Filled 2015-02-04: qty 56

## 2015-02-04 MED ORDER — SODIUM CHLORIDE 0.9 % IV SOLN
Freq: Once | INTRAVENOUS | Status: AC
  Administered 2015-02-04: 16:00:00 via INTRAVENOUS

## 2015-02-04 MED ORDER — PEGFILGRASTIM 6 MG/0.6ML ~~LOC~~ PSKT
6.0000 mg | PREFILLED_SYRINGE | Freq: Once | SUBCUTANEOUS | Status: AC
  Administered 2015-02-04: 6 mg via SUBCUTANEOUS
  Filled 2015-02-04: qty 0.6

## 2015-02-04 MED ORDER — CEPHALEXIN 500 MG PO CAPS: 500.0000 mg | ORAL_CAPSULE | Freq: Four times a day (QID) | ORAL | Status: DC

## 2015-02-04 MED ORDER — SODIUM CHLORIDE 0.9 % IV SOLN
600.0000 mg/m2 | Freq: Once | INTRAVENOUS | Status: AC
  Administered 2015-02-04: 1120 mg via INTRAVENOUS
  Filled 2015-02-04: qty 56

## 2015-02-04 MED ORDER — SODIUM CHLORIDE 0.9 % IJ SOLN
10.0000 mL | INTRAMUSCULAR | Status: DC | PRN
  Administered 2015-02-04: 10 mL
  Filled 2015-02-04: qty 10

## 2015-02-04 MED ORDER — HEPARIN SOD (PORK) LOCK FLUSH 100 UNIT/ML IV SOLN
500.0000 [IU] | Freq: Once | INTRAVENOUS | Status: AC | PRN
  Administered 2015-02-04: 500 [IU]
  Filled 2015-02-04: qty 5

## 2015-02-04 MED ORDER — FOSAPREPITANT DIMEGLUMINE INJECTION 150 MG
Freq: Once | INTRAVENOUS | Status: AC
  Administered 2015-02-04: 16:00:00 via INTRAVENOUS
  Filled 2015-02-04: qty 5

## 2015-02-04 MED ORDER — PALONOSETRON HCL INJECTION 0.25 MG/5ML
0.2500 mg | Freq: Once | INTRAVENOUS | Status: AC
  Administered 2015-02-04: 0.25 mg via INTRAVENOUS

## 2015-02-04 NOTE — Assessment & Plan Note (Signed)
Patient presents to the [facility] today to receive cycle 4 of her Adriamycin/Cytoxan chemotherapy regimen.  She states she's been doing fairly well following her last chemotherapy; but has developed an apparent paronychia to her left great toe.  She denies any other new symptoms whatsoever.  She denies any recent fevers or chills.  Labs obtained today reveal a WBC of 11.5, ANC 9.8, hemoglobin 11.7, and platelet count of 172.  After reviewing all details of today's visit with Dr. Festus Barren was determined the patient could proceed with her chemotherapy today.  Patient has plans to return on 02/18/2015 for labs, visit, and her next chemotherapy.

## 2015-02-04 NOTE — Telephone Encounter (Signed)
Appointments complete per all three 9/13 pof's. Patient given avs report and appointments for September thru December.

## 2015-02-04 NOTE — Progress Notes (Signed)
Okay to proceed with treatment per Selena Lesser NP.

## 2015-02-04 NOTE — Patient Instructions (Signed)
[facility] Discharge Instructions for Patients Receiving Chemotherapy  Today you received the following chemotherapy agents Adriamycin/Cytoxan.  To help prevent nausea and vomiting after your treatment, we encourage you to take your nausea medication as prescribed.    If you develop nausea and vomiting that is not controlled by your nausea medication, call the clinic.   BELOW ARE SYMPTOMS THAT SHOULD BE REPORTED IMMEDIATELY:  *FEVER GREATER THAN 100.5 F  *CHILLS WITH OR WITHOUT FEVER  NAUSEA AND VOMITING THAT IS NOT CONTROLLED WITH YOUR NAUSEA MEDICATION  *UNUSUAL SHORTNESS OF BREATH  *UNUSUAL BRUISING OR BLEEDING  TENDERNESS IN MOUTH AND THROAT WITH OR WITHOUT PRESENCE OF ULCERS  *URINARY PROBLEMS  *BOWEL PROBLEMS  UNUSUAL RASH Items with * indicate a potential emergency and should be followed up as soon as possible.  Feel free to call the clinic you have any questions or concerns. The clinic phone number is [phone].  Please show the CHEMO ALERT CARD at check-in to the Emergency Department and triage nurse.   

## 2015-02-04 NOTE — Assessment & Plan Note (Signed)
Patient reports pain, erythema, and edema to her left great toe surrounding the toenail for the last few days.  Patient states that she typically has the same symptoms a proximally once per year; it has associated in the past with jogging in her running shoes.  She states she has lost her  toenail in the past for the same reason.  She denies any fevers or chills.  Patient states that she has been soaking her toe for the last few nights; and has greatly improved.  On exam.  Patient does have some mild erythema, edema, and tenderness to her left great toe.  It does appear the patient has a mild paronychia to the site.  There is no surrounding edema, or red streaks.  Advised patient to continue soaking her toe; and will also prescribed Keflex antibiotics for treatment of mild paronychia.  Patient was advised to call/return or go directly to the emergency department for any worsening symptoms whatsoever.

## 2015-02-04 NOTE — Progress Notes (Signed)
Patient complains of left great toe, redness, swelling, and tenderness x 2 weeks. Patient states she has soaked her toe in epsom salt and noticed some relief. Selena Lesser, NP notified. Ok to proceed with treatment per Selena Lesser, NP.  Adriamycin administered through a free dripping normal saline line, positive blood return noted before, every 5 mL during, and after adriamycin administration. Patient tolerated well.

## 2015-02-04 NOTE — Progress Notes (Signed)
SYMPTOM MANAGEMENT CLINIC   HPI: Kaitlyn Keith 49 y.o. female diagnosed with breast cancer.  Currently undergoing Adriamycin/Cytoxan chemotherapy regimen.  Patient presented to the [facility] today.  Received cycle 4 of her Adriamycin/Cytoxan chemotherapy regimen.  Patient reports pain, erythema, and edema to her left great toe surrounding the toenail for the last few days.  Patient states that she typically has the same symptoms a proximally once per year; it has associated in the past with jogging in her running shoes.  She states she has lost her  toenail in the past for the same reason.  She denies any fevers or chills.  Patient states that she has been soaking her toe for the last few nights; and has greatly improved.  HPI  ROS  Past Medical History  Diagnosis Date  . Breast cancer of lower-outer quadrant of right female breast 12/05/2014  . Breast cancer   . Hypertension   . Anxiety   . Depression   . Arthritis   . Hot flashes   . Back pain     Past Surgical History  Procedure Laterality Date  . Breast reduction surgery    . Portacath placement N/A 12/23/2014    Procedure: INSERTION PORT-A-CATH;  Surgeon: [name], MD;  Location: [facility];  Service: General;  Laterality: N/A;    has Breast cancer of lower-outer quadrant of right female breast and Paronychia of great toe, left on her problem list.    has No Known Allergies.    Medication List       This list is accurate as of: 02/04/15  4:48 PM.  Always use your most recent med list.               ALPRAZolam 1 MG tablet  Commonly known as:  XANAX     cephALEXin 500 MG capsule  Commonly known as:  KEFLEX  Take 1 capsule (500 mg total) by mouth 4 (four) times daily.     dexamethasone 4 MG tablet  Commonly known as:  DECADRON  Take 2 tablets by mouth once a day on the day after chemotherapy and then take 2 tablets two times a day for 2 days.     lactulose 10 GM/15ML solution    Commonly known as:  CHRONULAC  Take 15 mLs (10 g total) by mouth 3 (three) times daily.     lidocaine-prilocaine cream  Commonly known as:  EMLA  Apply to affected area once     lisinopril-hydrochlorothiazide 20-12.5 MG per tablet  Commonly known as:  PRINZIDE,ZESTORETIC  Take 1 tablet by mouth 2 (two) times daily.     LORazepam 1 MG tablet  Commonly known as:  ATIVAN  Take 1 tablet (1 mg total) by mouth every 6 (six) hours as needed (Nausea or vomiting).     ondansetron 8 MG tablet  Commonly known as:  ZOFRAN  Take 1 tablet (8 mg total) by mouth 2 (two) times daily as needed. Start on the third day after chemotherapy.     oxyCODONE-acetaminophen 5-325 MG per tablet  Commonly known as:  ROXICET  Take 1-2 tablets by mouth every 4 (four) hours as needed.     prochlorperazine 10 MG tablet  Commonly known as:  COMPAZINE  Take 1 tablet (10 mg total) by mouth every 6 (six) hours as needed (Nausea or vomiting).     sertraline 50 MG tablet  Commonly known as:  ZOLOFT     Vitamin D (Ergocalciferol) 50000  UNITS Caps capsule  Commonly known as:  DRISDOL  Take 1 capsule (50,000 Units total) by mouth every 7 (seven) days.         PHYSICAL EXAMINATION  Oncology Vitals 02/04/2015 01/21/2015 01/07/2015 12/31/2014 12/24/2014 12/23/2014 12/23/2014  Height - 160 cm 160 cm 160 cm 160 cm - -  Weight 76.885 kg 76.839 kg 77.701 kg 78.382 kg 78.019 kg - -  Weight (lbs) 169 lbs 8 oz 169 lbs 6 oz 171 lbs 5 oz 172 lbs 13 oz 172 lbs - -  BMI (kg/m2) - 30.01 kg/m2 30.34 kg/m2 30.61 kg/m2 30.47 kg/m2 - -  Temp 98.3 98.2 98.7 98.1 98.1 97.8 -  Pulse 51 55 60 74 55 68 88  Resp '18 18 18 18 18 14 20  ' SpO2 100 100 100 100 100 96 99  BSA (m2) - 1.85 m2 1.86 m2 1.87 m2 1.86 m2 - -   BP Readings from Last 3 Encounters:  02/04/15 137/83  01/21/15 143/88  01/07/15 137/89    Physical Exam  Constitutional: She is oriented to person, place, and time and well-developed, well-nourished, and in no distress.   HENT:  Head: Normocephalic.  Eyes: Conjunctivae and EOM are normal. Pupils are equal, round, and reactive to light.  Neck: Normal range of motion.  Pulmonary/Chest: Effort normal. No respiratory distress.  Musculoskeletal: Normal range of motion. She exhibits edema and tenderness.  Neurological: She is alert and oriented to person, place, and time.  Skin: Skin is warm and dry. No rash noted. There is erythema. No pallor.  On exam.  Patient does have some mild erythema, edema, and tenderness to her left great toe.  It does appear the patient has a mild paronychia to the site.  There is no surrounding edema, or red streaks.    Psychiatric: Affect normal.  Nursing note and vitals reviewed.   LABORATORY DATA:. Appointment on 02/04/2015  Component Date Value Ref Range Status  . WBC 02/04/2015 11.5* 3.9 - 10.3 10e3/uL Final  . NEUT# 02/04/2015 9.8* 1.5 - 6.5 10e3/uL Final  . HGB 02/04/2015 11.7  11.6 - 15.9 g/dL Final  . HCT 02/04/2015 35.6  34.8 - 46.6 % Final  . Platelets 02/04/2015 172  145 - 400 10e3/uL Final  . MCV 02/04/2015 88.7  79.5 - 101.0 fL Final  . MCH 02/04/2015 29.1  25.1 - 34.0 pg Final  . MCHC 02/04/2015 32.8  31.5 - 36.0 g/dL Final  . RBC 02/04/2015 4.02  3.70 - 5.45 10e6/uL Final  . RDW 02/04/2015 12.8  11.2 - 14.5 % Final  . lymph# 02/04/2015 0.9  0.9 - 3.3 10e3/uL Final  . MONO# 02/04/2015 0.7  0.1 - 0.9 10e3/uL Final  . Eosinophils Absolute 02/04/2015 0.0  0.0 - 0.5 10e3/uL Final  . Basophils Absolute 02/04/2015 0.1  0.0 - 0.1 10e3/uL Final  . NEUT% 02/04/2015 85.7* 38.4 - 76.8 % Final  . LYMPH% 02/04/2015 7.7* 14.0 - 49.7 % Final  . MONO% 02/04/2015 5.9  0.0 - 14.0 % Final  . EOS% 02/04/2015 0.1  0.0 - 7.0 % Final  . BASO% 02/04/2015 0.6  0.0 - 2.0 % Final  . Sodium 02/04/2015 140  136 - 145 mEq/L Final  . Potassium 02/04/2015 3.7  3.5 - 5.1 mEq/L Final  . Chloride 02/04/2015 108  98 - 109 mEq/L Final  . CO2 02/04/2015 27  22 - 29 mEq/L Final  . Glucose  02/04/2015 85  70 - 140 mg/dl Final   Glucose reference  range is for nonfasting patients. Fasting glucose reference range is 70- 100.  Marland Kitchen BUN 02/04/2015 8.9  7.0 - 26.0 mg/dL Final  . Creatinine 02/04/2015 0.8  0.6 - 1.1 mg/dL Final  . Total Bilirubin 02/04/2015 0.30  0.20 - 1.20 mg/dL Final  . Alkaline Phosphatase 02/04/2015 88  40 - 150 U/L Final  . AST 02/04/2015 13  5 - 34 U/L Final  . ALT 02/04/2015 10  0 - 55 U/L Final  . Total Protein 02/04/2015 6.7  6.4 - 8.3 g/dL Final  . Albumin 02/04/2015 3.6  3.5 - 5.0 g/dL Final  . Calcium 02/04/2015 9.1  8.4 - 10.4 mg/dL Final  . Anion Gap 02/04/2015 6  3 - 11 mEq/L Final  . EGFR 02/04/2015 >90  >90 ml/min/1.73 m2 Final   eGFR is calculated using the CKD-EPI Creatinine Equation (2009)     RADIOGRAPHIC STUDIES: No results found.  ASSESSMENT/PLAN:    Paronychia of great toe, left Patient reports pain, erythema, and edema to her left great toe surrounding the toenail for the last few days.  Patient states that she typically has the same symptoms a proximally once per year; it has associated in the past with jogging in her running shoes.  She states she has lost her  toenail in the past for the same reason.  She denies any fevers or chills.  Patient states that she has been soaking her toe for the last few nights; and has greatly improved.  On exam.  Patient does have some mild erythema, edema, and tenderness to her left great toe.  It does appear the patient has a mild paronychia to the site.  There is no surrounding edema, or red streaks.  Advised patient to continue soaking her toe; and will also prescribed Keflex antibiotics for treatment of mild paronychia.  Patient was advised to call/return or go directly to the emergency department for any worsening symptoms whatsoever.  Breast cancer of lower-outer quadrant of right female breast Patient presents to the [facility] today to receive cycle 4 of her Adriamycin/Cytoxan chemotherapy  regimen.  She states she's been doing fairly well following her last chemotherapy; but has developed an apparent paronychia to her left great toe.  She denies any other new symptoms whatsoever.  She denies any recent fevers or chills.  Labs obtained today reveal a WBC of 11.5, ANC 9.8, hemoglobin 11.7, and platelet count of 172.  After reviewing all details of today's visit with Dr. Festus Barren was determined the patient could proceed with her chemotherapy today.  Patient has plans to return on 02/18/2015 for labs, visit, and her next chemotherapy.  Patient stated understanding of all instructions; and was in agreement with this plan of care. The patient knows to call the clinic with any problems, questions or concerns.   Review/collaboration with Dr. Lindi Adie regarding all aspects of patient's visit today.   Total time spent with patient was 15 minutes;  with greater than 75 percent of that time spent in face to face counseling regarding patient's symptoms,  and coordination of care and follow up.  Disclaimer:This dictation was prepared with Dragon/digital dictation along with [employer]. Any transcriptional errors that result from this process are unintentional.  Drue Second, NP 02/04/2015

## 2015-02-10 ENCOUNTER — Encounter: Payer: Self-pay | Admitting: Hematology and Oncology

## 2015-02-10 NOTE — Progress Notes (Signed)
Applied online through [employer] for copay relief. Pt approved,awaiting information to be released to portal within 24 hours.

## 2015-02-10 NOTE — Progress Notes (Signed)
I faxed [facility] [employer]  [phone] forms

## 2015-02-12 ENCOUNTER — Encounter: Payer: Self-pay | Admitting: Hematology and Oncology

## 2015-02-13 ENCOUNTER — Telehealth: Payer: Self-pay | Admitting: *Deleted

## 2015-02-13 NOTE — Telephone Encounter (Signed)
Pt called c/o "burning on the the bottom of my feet". Discussed case with Dr. Lindi Adie. Recommends vit B6 and menthol rub. Gave pt recommendations and instructions.  Received verbal understanding. Confirmed next appt on 9/27. Pt knows to call with worsening symptoms.

## 2015-02-17 NOTE — Assessment & Plan Note (Signed)
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 Treatment plan: 1. Neo-adjuvant chemotherapy with dose dense Adriamycin and Cytoxan 4 followed by Abraxane and carboplatin once a week 12 2. Followed by repeat breast MRI followed by surgery 3. Followed by radiation -------------------------------------------------------------------------------------------------------------------------------------------------- Current treatment: Completed 4 cycles of AC; Today is cycle 1 Abraxane and Carboplatin   Chemotherapy toxicities: 1. Constipation: resolved 2. Fatigue related to chemotherapy day 3-5 3. Insomnia: Continue Ativan 2 tablets at bedtime. Renewed 1 mg daily 4. Anxiety:  5. Nausea and vomiting 1 6. Mouth sores: Much improved   Return to clinic in 1 weeks for cycle 2/12 Abraxane and Carboplatin.

## 2015-02-18 ENCOUNTER — Other Ambulatory Visit (HOSPITAL_BASED_OUTPATIENT_CLINIC_OR_DEPARTMENT_OTHER): Payer: BLUE CROSS/BLUE SHIELD

## 2015-02-18 ENCOUNTER — Encounter: Payer: Self-pay | Admitting: Hematology and Oncology

## 2015-02-18 ENCOUNTER — Telehealth: Payer: Self-pay | Admitting: Hematology and Oncology

## 2015-02-18 ENCOUNTER — Ambulatory Visit (HOSPITAL_BASED_OUTPATIENT_CLINIC_OR_DEPARTMENT_OTHER): Payer: BLUE CROSS/BLUE SHIELD

## 2015-02-18 ENCOUNTER — Ambulatory Visit (HOSPITAL_BASED_OUTPATIENT_CLINIC_OR_DEPARTMENT_OTHER): Payer: BLUE CROSS/BLUE SHIELD | Admitting: Hematology and Oncology

## 2015-02-18 ENCOUNTER — Other Ambulatory Visit: Payer: BLUE CROSS/BLUE SHIELD

## 2015-02-18 VITALS — BP 118/77 | HR 69 | Temp 97.4°F | Resp 18 | Ht 63.0 in | Wt 170.2 lb

## 2015-02-18 DIAGNOSIS — C50511 Malignant neoplasm of lower-outer quadrant of right female breast: Secondary | ICD-10-CM

## 2015-02-18 DIAGNOSIS — Z171 Estrogen receptor negative status [ER-]: Secondary | ICD-10-CM | POA: Diagnosis not present

## 2015-02-18 DIAGNOSIS — Z5111 Encounter for antineoplastic chemotherapy: Secondary | ICD-10-CM | POA: Diagnosis not present

## 2015-02-18 LAB — COMPREHENSIVE METABOLIC PANEL (CC13)
ALT: 10 U/L (ref 0–55)
AST: 11 U/L (ref 5–34)
Albumin: 3.7 g/dL (ref 3.5–5.0)
Alkaline Phosphatase: 88 U/L (ref 40–150)
Anion Gap: 7 mEq/L (ref 3–11)
BUN: 8.1 mg/dL (ref 7.0–26.0)
CHLORIDE: 106 meq/L (ref 98–109)
CO2: 29 meq/L (ref 22–29)
CREATININE: 0.7 mg/dL (ref 0.6–1.1)
Calcium: 9.3 mg/dL (ref 8.4–10.4)
EGFR: >90 mL/min/{1.73_m2} (ref >90)
GLUCOSE: 63 mg/dL — AB (ref 70–140)
POTASSIUM: 3.7 meq/L (ref 3.5–5.1)
SODIUM: 142 meq/L (ref 136–145)
Total Bilirubin: 0.3 mg/dL (ref 0.20–1.20)
Total Protein: 6.9 g/dL (ref 6.4–8.3)

## 2015-02-18 LAB — CBC WITH DIFFERENTIAL/PLATELET
BASO%: 0.5 % (ref 0.0–2.0)
BASOS ABS: 0 10*3/uL (ref 0.0–0.1)
EOS%: 0.2 % (ref 0.0–7.0)
Eosinophils Absolute: 0 10*3/uL (ref 0.0–0.5)
HEMATOCRIT: 35.8 % (ref 34.8–46.6)
HGB: 11.9 g/dL (ref 11.6–15.9)
LYMPH#: 0.9 10*3/uL (ref 0.9–3.3)
LYMPH%: 11.1 % — ABNORMAL LOW (ref 14.0–49.7)
MCH: 29.7 pg (ref 25.1–34.0)
MCHC: 33.2 g/dL (ref 31.5–36.0)
MCV: 89.4 fL (ref 79.5–101.0)
MONO#: 0.8 10*3/uL (ref 0.1–0.9)
MONO%: 8.8 % (ref 0.0–14.0)
NEUT#: 6.8 10*3/uL — ABNORMAL HIGH (ref 1.5–6.5)
NEUT%: 79.4 % — AB (ref 38.4–76.8)
Platelets: 173 10*3/uL (ref 145–400)
RBC: 4.01 10*6/uL (ref 3.70–5.45)
RDW: 13.6 % (ref 11.2–14.5)
WBC: 8.5 10*3/uL (ref 3.9–10.3)

## 2015-02-18 MED ORDER — SODIUM CHLORIDE 0.9 % IV SOLN
Freq: Once | INTRAVENOUS | Status: AC
  Administered 2015-02-18: 13:00:00 via INTRAVENOUS

## 2015-02-18 MED ORDER — SODIUM CHLORIDE 0.9 % IJ SOLN
10.0000 mL | INTRAMUSCULAR | Status: DC | PRN
  Administered 2015-02-18: 10 mL
  Filled 2015-02-18: qty 10

## 2015-02-18 MED ORDER — ONDANSETRON HCL 8 MG PO TABS: 8.0000 mg | ORAL_TABLET | Freq: Two times a day (BID) | ORAL | Status: DC

## 2015-02-18 MED ORDER — PACLITAXEL PROTEIN-BOUND CHEMO INJECTION 100 MG
80.0000 mg/m2 | Freq: Once | INTRAVENOUS | Status: AC
  Administered 2015-02-18: 150 mg via INTRAVENOUS
  Filled 2015-02-18: qty 30

## 2015-02-18 MED ORDER — DEXAMETHASONE SODIUM PHOSPHATE 100 MG/10ML IJ SOLN
Freq: Once | INTRAMUSCULAR | Status: AC
  Administered 2015-02-18: 13:00:00 via INTRAVENOUS
  Filled 2015-02-18: qty 4

## 2015-02-18 MED ORDER — SODIUM CHLORIDE 0.9 % IV SOLN
264.2000 mg | Freq: Once | INTRAVENOUS | Status: AC
  Administered 2015-02-18: 260 mg via INTRAVENOUS
  Filled 2015-02-18: qty 26

## 2015-02-18 MED ORDER — HEPARIN SOD (PORK) LOCK FLUSH 100 UNIT/ML IV SOLN
500.0000 [IU] | Freq: Once | INTRAVENOUS | Status: AC | PRN
  Administered 2015-02-18: 500 [IU]
  Filled 2015-02-18: qty 5

## 2015-02-18 NOTE — Patient Instructions (Signed)
[facility] Discharge Instructions for Patients Receiving Chemotherapy  Today you received the following chemotherapy agents abraxane/carboplatin   To help prevent nausea and vomiting after your treatment, we encourage you to take your nausea medication as directed   If you develop nausea and vomiting that is not controlled by your nausea medication, call the clinic.   BELOW ARE SYMPTOMS THAT SHOULD BE REPORTED IMMEDIATELY:  *FEVER GREATER THAN 100.5 F  *CHILLS WITH OR WITHOUT FEVER  NAUSEA AND VOMITING THAT IS NOT CONTROLLED WITH YOUR NAUSEA MEDICATION  *UNUSUAL SHORTNESS OF BREATH  *UNUSUAL BRUISING OR BLEEDING  TENDERNESS IN MOUTH AND THROAT WITH OR WITHOUT PRESENCE OF ULCERS  *URINARY PROBLEMS  *BOWEL PROBLEMS  UNUSUAL RASH Items with * indicate a potential emergency and should be followed up as soon as possible.  Feel free to call the clinic you have any questions or concerns. The clinic phone number is [phone].  Please show the [facility] at check-in to the Emergency Department and triage nurse.  Nanoparticle Albumin-Bound Paclitaxel injection What is this medicine? NANOPARTICLE ALBUMIN-BOUND PACLITAXEL (Na no PAHR ti kuhl al BYOO muhn-bound PAK li TAX el) is a chemotherapy drug. It targets fast dividing cells, like cancer cells, and causes these cells to die. This medicine is used to treat advanced breast cancer and advanced lung cancer. This medicine may be used for other purposes; ask your health care provider or pharmacist if you have questions. COMMON BRAND NAME(S): Abraxane What should I tell my health care provider before I take this medicine? They need to know if you have any of these conditions: -kidney disease -liver disease -low blood counts, like low platelets, red blood cells, or white blood cells -recent or ongoing radiation therapy -an unusual or allergic reaction to paclitaxel, albumin, other chemotherapy, other medicines,  foods, dyes, or preservatives -pregnant or trying to get pregnant -breast-feeding How should I use this medicine? This drug is given as an infusion into a vein. It is administered in a hospital or clinic by a specially trained health care professional. Talk to your pediatrician regarding the use of this medicine in children. Special care may be needed. Overdosage: If you think you have taken too much of this medicine contact a poison control center or emergency room at once. NOTE: This medicine is only for you. Do not share this medicine with others. What if I miss a dose? It is important not to miss your dose. Call your doctor or health care professional if you are unable to keep an appointment. What may interact with this medicine? -cyclosporine -diazepam -ketoconazole -medicines to increase blood counts like filgrastim, pegfilgrastim, sargramostim -other chemotherapy drugs like cisplatin, doxorubicin, epirubicin, etoposide, teniposide, vincristine -quinidine -testosterone -vaccines -verapamil Talk to your doctor or health care professional before taking any of these medicines: -acetaminophen -aspirin -ibuprofen -ketoprofen -naproxen This list may not describe all possible interactions. Give your health care provider a list of all the medicines, herbs, non-prescription drugs, or dietary supplements you use. Also tell them if you smoke, drink alcohol, or use illegal drugs. Some items may interact with your medicine. What should I watch for while using this medicine? Your condition will be monitored carefully while you are receiving this medicine. You will need important blood work done while you are taking this medicine. This drug may make you feel generally unwell. This is not uncommon, as chemotherapy can affect healthy cells as well as cancer cells. Report any side effects. Continue your course of treatment  even though you feel ill unless your doctor tells you to stop. In some  cases, you may be given additional medicines to help with side effects. Follow all directions for their use. Call your doctor or health care professional for advice if you get a fever, chills or sore throat, or other symptoms of a cold or flu. Do not treat yourself. This drug decreases your body's ability to fight infections. Try to avoid being around people who are sick. This medicine may increase your risk to bruise or bleed. Call your doctor or health care professional if you notice any unusual bleeding. Be careful brushing and flossing your teeth or using a toothpick because you may get an infection or bleed more easily. If you have any dental work done, tell your dentist you are receiving this medicine. Avoid taking products that contain aspirin, acetaminophen, ibuprofen, naproxen, or ketoprofen unless instructed by your doctor. These medicines may hide a fever. Do not become pregnant while taking this medicine. Women should inform their doctor if they wish to become pregnant or think they might be pregnant. There is a potential for serious side effects to an unborn child. Talk to your health care professional or pharmacist for more information. Do not breast-feed an infant while taking this medicine. Men are advised not to father a child while receiving this medicine. What side effects may I notice from receiving this medicine? Side effects that you should report to your doctor or health care professional as soon as possible: -allergic reactions like skin rash, itching or hives, swelling of the face, lips, or tongue -low blood counts - This drug may decrease the number of white blood cells, red blood cells and platelets. You may be at increased risk for infections and bleeding. -signs of infection - fever or chills, cough, sore throat, pain or difficulty passing urine -signs of decreased platelets or bleeding - bruising, pinpoint red spots on the skin, black, tarry stools, nosebleeds -signs of  decreased red blood cells - unusually weak or tired, fainting spells, lightheadedness -breathing problems -changes in vision -chest pain -high or low blood pressure -mouth sores -nausea and vomiting -pain, swelling, redness or irritation at the injection site -pain, tingling, numbness in the hands or feet -slow or irregular heartbeat -swelling of the ankle, feet, hands Side effects that usually do not require medical attention (report to your doctor or health care professional if they continue or are bothersome): -aches, pains -changes in the color of fingernails -diarrhea -hair loss -loss of appetite This list may not describe all possible side effects. Call your doctor for medical advice about side effects. You may report side effects to FDA at 1-800-FDA-1088. Where should I keep my medicine? This drug is given in a hospital or clinic and will not be stored at home. NOTE: This sheet is a summary. It may not cover all possible information. If you have questions about this medicine, talk to your doctor, pharmacist, or health care provider.  2015, Elsevier/Gold Standard. (2012-07-03 16:48:50)  Carboplatin injection What is this medicine? CARBOPLATIN (KAR boe pla tin) is a chemotherapy drug. It targets fast dividing cells, like cancer cells, and causes these cells to die. This medicine is used to treat ovarian cancer and many other cancers. This medicine may be used for other purposes; ask your health care provider or pharmacist if you have questions. COMMON BRAND NAME(S): Paraplatin What should I tell my health care provider before I take this medicine? They need to know if you have  any of these conditions: -blood disorders -hearing problems -kidney disease -recent or ongoing radiation therapy -an unusual or allergic reaction to carboplatin, cisplatin, other chemotherapy, other medicines, foods, dyes, or preservatives -pregnant or trying to get pregnant -breast-feeding How should  I use this medicine? This drug is usually given as an infusion into a vein. It is administered in a hospital or clinic by a specially trained health care professional. Talk to your pediatrician regarding the use of this medicine in children. Special care may be needed. Overdosage: If you think you have taken too much of this medicine contact a poison control center or emergency room at once. NOTE: This medicine is only for you. Do not share this medicine with others. What if I miss a dose? It is important not to miss a dose. Call your doctor or health care professional if you are unable to keep an appointment. What may interact with this medicine? -medicines for seizures -medicines to increase blood counts like filgrastim, pegfilgrastim, sargramostim -some antibiotics like amikacin, gentamicin, neomycin, streptomycin, tobramycin -vaccines Talk to your doctor or health care professional before taking any of these medicines: -acetaminophen -aspirin -ibuprofen -ketoprofen -naproxen This list may not describe all possible interactions. Give your health care provider a list of all the medicines, herbs, non-prescription drugs, or dietary supplements you use. Also tell them if you smoke, drink alcohol, or use illegal drugs. Some items may interact with your medicine. What should I watch for while using this medicine? Your condition will be monitored carefully while you are receiving this medicine. You will need important blood work done while you are taking this medicine. This drug may make you feel generally unwell. This is not uncommon, as chemotherapy can affect healthy cells as well as cancer cells. Report any side effects. Continue your course of treatment even though you feel ill unless your doctor tells you to stop. In some cases, you may be given additional medicines to help with side effects. Follow all directions for their use. Call your doctor or health care professional for advice if you  get a fever, chills or sore throat, or other symptoms of a cold or flu. Do not treat yourself. This drug decreases your body's ability to fight infections. Try to avoid being around people who are sick. This medicine may increase your risk to bruise or bleed. Call your doctor or health care professional if you notice any unusual bleeding. Be careful brushing and flossing your teeth or using a toothpick because you may get an infection or bleed more easily. If you have any dental work done, tell your dentist you are receiving this medicine. Avoid taking products that contain aspirin, acetaminophen, ibuprofen, naproxen, or ketoprofen unless instructed by your doctor. These medicines may hide a fever. Do not become pregnant while taking this medicine. Women should inform their doctor if they wish to become pregnant or think they might be pregnant. There is a potential for serious side effects to an unborn child. Talk to your health care professional or pharmacist for more information. Do not breast-feed an infant while taking this medicine. What side effects may I notice from receiving this medicine? Side effects that you should report to your doctor or health care professional as soon as possible: -allergic reactions like skin rash, itching or hives, swelling of the face, lips, or tongue -signs of infection - fever or chills, cough, sore throat, pain or difficulty passing urine -signs of decreased platelets or bleeding - bruising, pinpoint red spots on the  skin, black, tarry stools, nosebleeds -signs of decreased red blood cells - unusually weak or tired, fainting spells, lightheadedness -breathing problems -changes in hearing -changes in vision -chest pain -high blood pressure -low blood counts - This drug may decrease the number of white blood cells, red blood cells and platelets. You may be at increased risk for infections and bleeding. -nausea and vomiting -pain, swelling, redness or irritation  at the injection site -pain, tingling, numbness in the hands or feet -problems with balance, talking, walking -trouble passing urine or change in the amount of urine Side effects that usually do not require medical attention (report to your doctor or health care professional if they continue or are bothersome): -hair loss -loss of appetite -metallic taste in the mouth or changes in taste This list may not describe all possible side effects. Call your doctor for medical advice about side effects. You may report side effects to FDA at 1-800-FDA-1088. Where should I keep my medicine? This drug is given in a hospital or clinic and will not be stored at home. NOTE: This sheet is a summary. It may not cover all possible information. If you have questions about this medicine, talk to your doctor, pharmacist, or health care provider.  2015, Elsevier/Gold Standard. (2007-08-15 14:38:05)

## 2015-02-18 NOTE — Progress Notes (Addendum)
Patient Care Team: Secundino Ginger, [name], [name], [name], [name], [name], [name], NP as Nurse Practitioner (Nurse Practitioner)  DIAGNOSIS: Breast cancer of lower-outer quadrant of right female breast   Staging form: Breast, AJCC 7th Edition     Clinical stage from 12/11/2014: Stage IIA (T2, N0, M0) - Unsigned       Staging comments: Staged at breast conference on 7.20.16    SUMMARY OF ONCOLOGIC HISTORY:   Breast cancer of lower-outer quadrant of right female breast   12/03/2014 Mammogram Right breast mass 1.9 cm it o'clock position 8 cm depth from the nipple   12/03/2014 Initial Diagnosis Right breast biopsy 8:00: Invasive ductal carcinoma, grade 3, ER 0%, PR 0%, 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 She completed 4 cycles of dose dense Adriamycin and Cytoxan and is here today to receive first cycle of Abraxane with carboplatin. 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 Profound tingling numbness and burning sensation in both the feet improved after taking B-12 oral supplement along with massaging the feet with Vicks  VapoRub.  REVIEW OF SYSTEMS:   Constitutional: Denies fevers, chills or abnormal weight loss Eyes: Denies blurriness of vision Ears, nose, mouth, throat, and face: Denies mucositis or sore throat Respiratory: Denies cough, dyspnea or wheezes Cardiovascular: Denies palpitation, chest discomfort or lower extremity swelling Gastrointestinal:  Denies nausea, heartburn or change in bowel habits Skin: Denies abnormal skin rashes Lymphatics: Denies new lymphadenopathy or easy bruising Neurological: neuropathy better Behavioral/Psych: Mood is stable, no new changes   All other systems were reviewed with the patient and are negative.  I have reviewed the past medical history, past surgical history, social history and family history with the patient and they are unchanged from previous note.  ALLERGIES:  has No Known Allergies.  MEDICATIONS:  Current Outpatient Prescriptions  Medication Sig Dispense Refill  . ALPRAZolam (XANAX) 1 MG tablet     . cephALEXin (KEFLEX) 500 MG capsule Take 1 capsule (500 mg total) by mouth 4 (four) times daily. 40 capsule 0  . dexamethasone (DECADRON) 4 MG tablet Take 2 tablets by mouth once a day on the day after chemotherapy and then take 2 tablets two times a day for 2 days. 30 tablet 1  . lactulose (CHRONULAC) 10 GM/15ML solution Take 15 mLs (10 g total) by mouth 3 (three) times daily. 240 mL 0  . lidocaine-prilocaine (EMLA) cream Apply to affected area once 30 g 3  . lisinopril-hydrochlorothiazide (PRINZIDE,ZESTORETIC) 20-12.5 MG per tablet Take 1 tablet by mouth 2 (two) times daily. 60 tablet 1  . LORazepam (ATIVAN) 1 MG tablet Take 1 tablet (1 mg total) by mouth every 6 (six) hours as needed (Nausea or  vomiting). 30 tablet 0  . ondansetron (ZOFRAN) 8 MG tablet Take 1 tablet (8 mg total) by mouth 2 (two) times daily as needed. Start on the third day after chemotherapy. 30 tablet 1  . oxyCODONE-acetaminophen (ROXICET) 5-325 MG per tablet Take 1-2 tablets by  mouth every 4 (four) hours as needed. 50 tablet 0  . prochlorperazine (COMPAZINE) 10 MG tablet Take 1 tablet (10 mg total) by mouth every 6 (six) hours as needed (Nausea or vomiting). 30 tablet 1  . sertraline (ZOLOFT) 50 MG tablet     . Vitamin D, Ergocalciferol, (DRISDOL) 50000 UNITS CAPS capsule Take 1 capsule (50,000 Units total) by mouth every 7 (seven) days. 12 capsule 3   No current facility-administered medications for this visit.    PHYSICAL EXAMINATION: ECOG PERFORMANCE STATUS: 1 - Symptomatic but completely ambulatory  Filed Vitals:   02/18/15 1126  BP: 118/77  Pulse: 69  Temp: 97.4 F (36.3 C)  Resp: 18   Filed Weights   02/18/15 1126  Weight: 170 lb 3.2 oz (77.202 kg)    GENERAL:alert, no distress and comfortable SKIN: skin color, texture, turgor are normal, no rashes or significant lesions EYES: normal, Conjunctiva are pink and non-injected, sclera clear OROPHARYNX:no exudate, no erythema and lips, buccal mucosa, and tongue normal  NECK: supple, thyroid normal size, non-tender, without nodularity LYMPH:  no palpable lymphadenopathy in the cervical, axillary or inguinal LUNGS: clear to auscultation and percussion with normal breathing effort HEART: regular rate & rhythm and no murmurs and no lower extremity edema ABDOMEN:abdomen soft, non-tender and normal bowel sounds Musculoskeletal:no cyanosis of digits and no clubbing  NEURO: Neuropathy resolved  LABORATORY DATA:  I have reviewed the data as listed   Chemistry      Component Value Date/Time   NA 140 02/04/2015 1433   K 3.7 02/04/2015 1433   CO2 27 02/04/2015 1433   BUN 8.9 02/04/2015 1433   CREATININE 0.8 02/04/2015 1433      Component Value Date/Time   CALCIUM 9.1 02/04/2015 1433   ALKPHOS 88 02/04/2015 1433   AST 13 02/04/2015 1433   ALT 10 02/04/2015 1433   BILITOT 0.30 02/04/2015 1433       Lab Results  Component Value Date   WBC 8.5 02/18/2015   HGB 11.9 02/18/2015   HCT 35.8  02/18/2015   MCV 89.4 02/18/2015   PLT 173 02/18/2015   NEUTROABS 6.8* 02/18/2015   ASSESSMENT & PLAN:  Breast cancer of lower-outer quadrant of right female breast Right breast biopsy 12/03/2014 8:00: Invasive ductal carcinoma, grade 3, ER 0%, PR 0%, 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 Treatment plan: 1. Neo-adjuvant chemotherapy with dose dense Adriamycin and Cytoxan 4 followed by Abraxane and carboplatin once a week 12 2. Followed by repeat breast MRI followed by surgery 3. Followed by radiation -------------------------------------------------------------------------------------------------------------------------------------------------- Current treatment: Completed 4 cycles of AC; Today is cycle 1 Abraxane and Carboplatin   Chemotherapy toxicities: 1. Constipation: resolved 2. Fatigue related to chemotherapy day 3-5 3. Insomnia: Continue Ativan 2 tablets at bedtime. Renewed 1 mg daily 4. Anxiety:  5. Nausea and vomiting 1 6. Mouth sores: Much improved  7. Neuropathy after cycle 4 of dose dense Adriamycin and Cytoxan completely resolved currently.  Return to clinic in 1 weeks for cycle 2/12 Abraxane and Carboplatin and for toxicity check. I renewed prescription for  Zofran. Patient does not need anymore dexamethasone.  No orders of the defined types were placed in this encounter.   The patient has a good understanding of the overall plan. she agrees with it. she will call with any problems that may develop before the next visit here.   Rulon Eisenmenger, MD     Addendum Patient's chemotherapy treatment will be changed to single agent Abraxane. This is a result of her insurance not authorizing the combination of Abraxane with carboplatin. The data from [facility] breast cancer symposium 2015 have convinced me that carboplatin would have benefited  in neo-adjuvant therapy for triple negative breast cancer.   These studies are listed below 1. Phase II [facility] [id number] trial, which showed improvements in pathologic complete response rates with carboplatin,1 and the  2. Phase II [place] GeparSixto trial, which showed improved pathologic complete response rates and demonstrated a benefit for carboplatin in event-free survival.  However these have not made it to the NCCN guidelines.  Since the patient's insurance company is not allowing [place] to use carboplatin, I have decided to use single agent Abraxane alone.

## 2015-02-18 NOTE — Telephone Encounter (Signed)
Gave avs & calendar for October thru December.

## 2015-02-19 ENCOUNTER — Telehealth: Payer: Self-pay | Admitting: *Deleted

## 2015-02-19 NOTE — Telephone Encounter (Signed)
1st Abraxane/Carbo. Attempted to call patient but no answer and voice mail not set up to leave a message.

## 2015-02-20 ENCOUNTER — Other Ambulatory Visit: Payer: Self-pay | Admitting: Hematology and Oncology

## 2015-02-25 ENCOUNTER — Ambulatory Visit (HOSPITAL_BASED_OUTPATIENT_CLINIC_OR_DEPARTMENT_OTHER): Payer: BLUE CROSS/BLUE SHIELD

## 2015-02-25 ENCOUNTER — Other Ambulatory Visit (HOSPITAL_BASED_OUTPATIENT_CLINIC_OR_DEPARTMENT_OTHER): Payer: BLUE CROSS/BLUE SHIELD

## 2015-02-25 ENCOUNTER — Encounter: Payer: Self-pay | Admitting: Hematology and Oncology

## 2015-02-25 ENCOUNTER — Ambulatory Visit (HOSPITAL_BASED_OUTPATIENT_CLINIC_OR_DEPARTMENT_OTHER): Payer: BLUE CROSS/BLUE SHIELD | Admitting: Hematology and Oncology

## 2015-02-25 VITALS — BP 135/87 | HR 67 | Temp 98.4°F | Resp 18 | Ht 63.0 in | Wt 168.7 lb

## 2015-02-25 DIAGNOSIS — G620 Drug-induced polyneuropathy: Secondary | ICD-10-CM | POA: Diagnosis not present

## 2015-02-25 DIAGNOSIS — R5383 Other fatigue: Secondary | ICD-10-CM | POA: Diagnosis not present

## 2015-02-25 DIAGNOSIS — Z5111 Encounter for antineoplastic chemotherapy: Secondary | ICD-10-CM | POA: Diagnosis not present

## 2015-02-25 DIAGNOSIS — C50511 Malignant neoplasm of lower-outer quadrant of right female breast: Secondary | ICD-10-CM

## 2015-02-25 DIAGNOSIS — Z171 Estrogen receptor negative status [ER-]: Secondary | ICD-10-CM

## 2015-02-25 LAB — CBC WITH DIFFERENTIAL/PLATELET
BASO%: 0.7 % (ref 0.0–2.0)
BASOS ABS: 0 10*3/uL (ref 0.0–0.1)
EOS%: 0.1 % (ref 0.0–7.0)
Eosinophils Absolute: 0 10*3/uL (ref 0.0–0.5)
HCT: 31.5 % — ABNORMAL LOW (ref 34.8–46.6)
HEMOGLOBIN: 10.6 g/dL — AB (ref 11.6–15.9)
LYMPH%: 17.7 % (ref 14.0–49.7)
MCH: 29.7 pg (ref 25.1–34.0)
MCHC: 33.5 g/dL (ref 31.5–36.0)
MCV: 88.6 fL (ref 79.5–101.0)
MONO#: 0.5 10*3/uL (ref 0.1–0.9)
MONO%: 9.8 % (ref 0.0–14.0)
NEUT#: 3.3 10*3/uL (ref 1.5–6.5)
NEUT%: 71.7 % (ref 38.4–76.8)
Platelets: 247 10*3/uL (ref 145–400)
RBC: 3.56 10*6/uL — ABNORMAL LOW (ref 3.70–5.45)
RDW: 13.6 % (ref 11.2–14.5)
WBC: 4.6 10*3/uL (ref 3.9–10.3)
lymph#: 0.8 10*3/uL — ABNORMAL LOW (ref 0.9–3.3)

## 2015-02-25 LAB — COMPREHENSIVE METABOLIC PANEL (CC13)
ALT: 18 U/L (ref 0–55)
AST: 16 U/L (ref 5–34)
Albumin: 3.7 g/dL (ref 3.5–5.0)
Alkaline Phosphatase: 72 U/L (ref 40–150)
Anion Gap: 6 mEq/L (ref 3–11)
BUN: 11.5 mg/dL (ref 7.0–26.0)
CHLORIDE: 108 meq/L (ref 98–109)
CO2: 27 mEq/L (ref 22–29)
Calcium: 8.9 mg/dL (ref 8.4–10.4)
Creatinine: 0.8 mg/dL (ref 0.6–1.1)
EGFR: >90 mL/min/{1.73_m2} (ref >90)
GLUCOSE: 94 mg/dL (ref 70–140)
POTASSIUM: 3.8 meq/L (ref 3.5–5.1)
SODIUM: 140 meq/L (ref 136–145)
Total Bilirubin: 0.38 mg/dL (ref 0.20–1.20)
Total Protein: 6.5 g/dL (ref 6.4–8.3)

## 2015-02-25 MED ORDER — ONDANSETRON HCL 8 MG PO TABS: 8.0000 mg | ORAL_TABLET | Freq: Two times a day (BID) | ORAL | Status: DC | PRN

## 2015-02-25 MED ORDER — SODIUM CHLORIDE 0.9 % IJ SOLN
10.0000 mL | INTRAMUSCULAR | Status: DC | PRN
  Administered 2015-02-25: 10 mL
  Filled 2015-02-25: qty 10

## 2015-02-25 MED ORDER — HEPARIN SOD (PORK) LOCK FLUSH 100 UNIT/ML IV SOLN
500.0000 [IU] | Freq: Once | INTRAVENOUS | Status: AC | PRN
  Administered 2015-02-25: 500 [IU]
  Filled 2015-02-25: qty 5

## 2015-02-25 MED ORDER — SODIUM CHLORIDE 0.9 % IV SOLN
Freq: Once | INTRAVENOUS | Status: AC
  Administered 2015-02-25: 15:00:00 via INTRAVENOUS

## 2015-02-25 MED ORDER — PACLITAXEL PROTEIN-BOUND CHEMO INJECTION 100 MG
80.0000 mg/m2 | Freq: Once | INTRAVENOUS | Status: AC
  Administered 2015-02-25: 150 mg via INTRAVENOUS
  Filled 2015-02-25: qty 30

## 2015-02-25 MED ORDER — SODIUM CHLORIDE 0.9 % IV SOLN
Freq: Once | INTRAVENOUS | Status: AC
  Administered 2015-02-25: 16:00:00 via INTRAVENOUS
  Filled 2015-02-25: qty 4

## 2015-02-25 NOTE — Assessment & Plan Note (Signed)
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 Treatment plan: 1. Neo-adjuvant chemotherapy with dose dense Adriamycin and Cytoxan 4 followed by Abraxane and carboplatin once a week 12 2. Followed by repeat breast MRI followed by surgery 3. Followed by radiation -------------------------------------------------------------------------------------------------------------------------------------------------- Current treatment: Completed 4 cycles of AC; Today is cycle 2 Abraxane and Carboplatin   Chemotherapy toxicities: 1. Constipation: resolved 2. Fatigue related to chemotherapy day 3-5 3. Insomnia: Continue Ativan 2 tablets at bedtime. Renewed 1 mg daily 4. Anxiety:  5. Nausea and vomiting 1 6. Mouth sores: Much improved  7. Neuropathy after cycle 4 of dose dense Adriamycin and Cytoxan completely resolved currently.  Monitoring closely for toxicities. Return to clinic in 2 weeks for cycle 4/12 Abraxane and Carboplatin and for toxicity check.

## 2015-02-25 NOTE — Patient Instructions (Signed)
[facility] Discharge Instructions for Patients Receiving Chemotherapy  Today you received the following chemotherapy agents abraxane/carboplatin   To help prevent nausea and vomiting after your treatment, we encourage you to take your nausea medication as directed   If you develop nausea and vomiting that is not controlled by your nausea medication, call the clinic.   BELOW ARE SYMPTOMS THAT SHOULD BE REPORTED IMMEDIATELY:  *FEVER GREATER THAN 100.5 F  *CHILLS WITH OR WITHOUT FEVER  NAUSEA AND VOMITING THAT IS NOT CONTROLLED WITH YOUR NAUSEA MEDICATION  *UNUSUAL SHORTNESS OF BREATH  *UNUSUAL BRUISING OR BLEEDING  TENDERNESS IN MOUTH AND THROAT WITH OR WITHOUT PRESENCE OF ULCERS  *URINARY PROBLEMS  *BOWEL PROBLEMS  UNUSUAL RASH Items with * indicate a potential emergency and should be followed up as soon as possible.  Feel free to call the clinic you have any questions or concerns. The clinic phone number is [phone].  Please show the [place] at check-in to the Emergency Department and triage nurse.  Nanoparticle Albumin-Bound Paclitaxel injection What is this medicine? NANOPARTICLE ALBUMIN-BOUND PACLITAXEL (Na no PAHR ti kuhl al BYOO muhn-bound PAK li TAX el) is a chemotherapy drug. It targets fast dividing cells, like cancer cells, and causes these cells to die. This medicine is used to treat advanced breast cancer and advanced lung cancer. This medicine may be used for other purposes; ask your health care provider or pharmacist if you have questions. COMMON BRAND NAME(S): Abraxane What should I tell my health care provider before I take this medicine? They need to know if you have any of these conditions: -kidney disease -liver disease -low blood counts, like low platelets, red blood cells, or white blood cells -recent or ongoing radiation therapy -an unusual or allergic reaction to paclitaxel, albumin, other chemotherapy, other medicines,  foods, dyes, or preservatives -pregnant or trying to get pregnant -breast-feeding How should I use this medicine? This drug is given as an infusion into a vein. It is administered in a hospital or clinic by a specially trained health care professional. Talk to your pediatrician regarding the use of this medicine in children. Special care may be needed. Overdosage: If you think you have taken too much of this medicine contact a poison control center or emergency room at once. NOTE: This medicine is only for you. Do not share this medicine with others. What if I miss a dose? It is important not to miss your dose. Call your doctor or health care professional if you are unable to keep an appointment. What may interact with this medicine? -cyclosporine -diazepam -ketoconazole -medicines to increase blood counts like filgrastim, pegfilgrastim, sargramostim -other chemotherapy drugs like cisplatin, doxorubicin, epirubicin, etoposide, teniposide, vincristine -quinidine -testosterone -vaccines -verapamil Talk to your doctor or health care professional before taking any of these medicines: -acetaminophen -aspirin -ibuprofen -ketoprofen -naproxen This list may not describe all possible interactions. Give your health care provider a list of all the medicines, herbs, non-prescription drugs, or dietary supplements you use. Also tell them if you smoke, drink alcohol, or use illegal drugs. Some items may interact with your medicine. What should I watch for while using this medicine? Your condition will be monitored carefully while you are receiving this medicine. You will need important blood work done while you are taking this medicine. This drug may make you feel generally unwell. This is not uncommon, as chemotherapy can affect healthy cells as well as cancer cells. Report any side effects. Continue your course of treatment  even though you feel ill unless your doctor tells you to stop. In some  cases, you may be given additional medicines to help with side effects. Follow all directions for their use. Call your doctor or health care professional for advice if you get a fever, chills or sore throat, or other symptoms of a cold or flu. Do not treat yourself. This drug decreases your body's ability to fight infections. Try to avoid being around people who are sick. This medicine may increase your risk to bruise or bleed. Call your doctor or health care professional if you notice any unusual bleeding. Be careful brushing and flossing your teeth or using a toothpick because you may get an infection or bleed more easily. If you have any dental work done, tell your dentist you are receiving this medicine. Avoid taking products that contain aspirin, acetaminophen, ibuprofen, naproxen, or ketoprofen unless instructed by your doctor. These medicines may hide a fever. Do not become pregnant while taking this medicine. Women should inform their doctor if they wish to become pregnant or think they might be pregnant. There is a potential for serious side effects to an unborn child. Talk to your health care professional or pharmacist for more information. Do not breast-feed an infant while taking this medicine. Men are advised not to father a child while receiving this medicine. What side effects may I notice from receiving this medicine? Side effects that you should report to your doctor or health care professional as soon as possible: -allergic reactions like skin rash, itching or hives, swelling of the face, lips, or tongue -low blood counts - This drug may decrease the number of white blood cells, red blood cells and platelets. You may be at increased risk for infections and bleeding. -signs of infection - fever or chills, cough, sore throat, pain or difficulty passing urine -signs of decreased platelets or bleeding - bruising, pinpoint red spots on the skin, black, tarry stools, nosebleeds -signs of  decreased red blood cells - unusually weak or tired, fainting spells, lightheadedness -breathing problems -changes in vision -chest pain -high or low blood pressure -mouth sores -nausea and vomiting -pain, swelling, redness or irritation at the injection site -pain, tingling, numbness in the hands or feet -slow or irregular heartbeat -swelling of the ankle, feet, hands Side effects that usually do not require medical attention (report to your doctor or health care professional if they continue or are bothersome): -aches, pains -changes in the color of fingernails -diarrhea -hair loss -loss of appetite This list may not describe all possible side effects. Call your doctor for medical advice about side effects. You may report side effects to FDA at 1-800-FDA-1088. Where should I keep my medicine? This drug is given in a hospital or clinic and will not be stored at home. NOTE: This sheet is a summary. It may not cover all possible information. If you have questions about this medicine, talk to your doctor, pharmacist, or health care provider.  2015, Elsevier/Gold Standard. (2012-07-03 16:48:50)  Carboplatin injection What is this medicine? CARBOPLATIN (KAR boe pla tin) is a chemotherapy drug. It targets fast dividing cells, like cancer cells, and causes these cells to die. This medicine is used to treat ovarian cancer and many other cancers. This medicine may be used for other purposes; ask your health care provider or pharmacist if you have questions. COMMON BRAND NAME(S): Paraplatin What should I tell my health care provider before I take this medicine? They need to know if you have  any of these conditions: -blood disorders -hearing problems -kidney disease -recent or ongoing radiation therapy -an unusual or allergic reaction to carboplatin, cisplatin, other chemotherapy, other medicines, foods, dyes, or preservatives -pregnant or trying to get pregnant -breast-feeding How should  I use this medicine? This drug is usually given as an infusion into a vein. It is administered in a hospital or clinic by a specially trained health care professional. Talk to your pediatrician regarding the use of this medicine in children. Special care may be needed. Overdosage: If you think you have taken too much of this medicine contact a poison control center or emergency room at once. NOTE: This medicine is only for you. Do not share this medicine with others. What if I miss a dose? It is important not to miss a dose. Call your doctor or health care professional if you are unable to keep an appointment. What may interact with this medicine? -medicines for seizures -medicines to increase blood counts like filgrastim, pegfilgrastim, sargramostim -some antibiotics like amikacin, gentamicin, neomycin, streptomycin, tobramycin -vaccines Talk to your doctor or health care professional before taking any of these medicines: -acetaminophen -aspirin -ibuprofen -ketoprofen -naproxen This list may not describe all possible interactions. Give your health care provider a list of all the medicines, herbs, non-prescription drugs, or dietary supplements you use. Also tell them if you smoke, drink alcohol, or use illegal drugs. Some items may interact with your medicine. What should I watch for while using this medicine? Your condition will be monitored carefully while you are receiving this medicine. You will need important blood work done while you are taking this medicine. This drug may make you feel generally unwell. This is not uncommon, as chemotherapy can affect healthy cells as well as cancer cells. Report any side effects. Continue your course of treatment even though you feel ill unless your doctor tells you to stop. In some cases, you may be given additional medicines to help with side effects. Follow all directions for their use. Call your doctor or health care professional for advice if you  get a fever, chills or sore throat, or other symptoms of a cold or flu. Do not treat yourself. This drug decreases your body's ability to fight infections. Try to avoid being around people who are sick. This medicine may increase your risk to bruise or bleed. Call your doctor or health care professional if you notice any unusual bleeding. Be careful brushing and flossing your teeth or using a toothpick because you may get an infection or bleed more easily. If you have any dental work done, tell your dentist you are receiving this medicine. Avoid taking products that contain aspirin, acetaminophen, ibuprofen, naproxen, or ketoprofen unless instructed by your doctor. These medicines may hide a fever. Do not become pregnant while taking this medicine. Women should inform their doctor if they wish to become pregnant or think they might be pregnant. There is a potential for serious side effects to an unborn child. Talk to your health care professional or pharmacist for more information. Do not breast-feed an infant while taking this medicine. What side effects may I notice from receiving this medicine? Side effects that you should report to your doctor or health care professional as soon as possible: -allergic reactions like skin rash, itching or hives, swelling of the face, lips, or tongue -signs of infection - fever or chills, cough, sore throat, pain or difficulty passing urine -signs of decreased platelets or bleeding - bruising, pinpoint red spots on the  skin, black, tarry stools, nosebleeds -signs of decreased red blood cells - unusually weak or tired, fainting spells, lightheadedness -breathing problems -changes in hearing -changes in vision -chest pain -high blood pressure -low blood counts - This drug may decrease the number of white blood cells, red blood cells and platelets. You may be at increased risk for infections and bleeding. -nausea and vomiting -pain, swelling, redness or irritation  at the injection site -pain, tingling, numbness in the hands or feet -problems with balance, talking, walking -trouble passing urine or change in the amount of urine Side effects that usually do not require medical attention (report to your doctor or health care professional if they continue or are bothersome): -hair loss -loss of appetite -metallic taste in the mouth or changes in taste This list may not describe all possible side effects. Call your doctor for medical advice about side effects. You may report side effects to FDA at 1-800-FDA-1088. Where should I keep my medicine? This drug is given in a hospital or clinic and will not be stored at home. NOTE: This sheet is a summary. It may not cover all possible information. If you have questions about this medicine, talk to your doctor, pharmacist, or health care provider.  2015, Elsevier/Gold Standard. (2007-08-15 14:38:05)

## 2015-02-25 NOTE — Addendum Note (Signed)
Addended by: Prentiss Bells on: 02/25/2015 03:12 PM   Modules accepted: Medications

## 2015-02-25 NOTE — Progress Notes (Signed)
Patient Care Team: Secundino Ginger, [name], [name], [name], [name], [name], [name], NP as Nurse Practitioner (Nurse Practitioner)  DIAGNOSIS: Breast cancer of lower-outer quadrant of right female breast [facility])   Staging form: Breast, AJCC 7th Edition     Clinical stage from 12/11/2014: Stage IIA (T2, N0, M0) - Unsigned       Staging comments: Staged at breast conference on 7.20.16    SUMMARY OF ONCOLOGIC HISTORY:   Breast cancer of lower-outer quadrant of right female breast ([place])   12/03/2014 Mammogram Right breast mass 1.9 cm it o'clock position 8 cm depth from the nipple   12/03/2014 Initial Diagnosis Right breast biopsy 8:00: Invasive ductal carcinoma, grade 3, ER 0%, PR 0%, 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 We had given her Abraxane and carboplatin with week 1. However her insurance denied the carboplatin.  Sofa today and going forward we will give her only Abraxane. She had done extremely well from the prior chemotherapy. Did have mild tingling for 1 day but it went away. Denied any nausea vomiting. She would like a refill of Zofran.  REVIEW OF SYSTEMS:   Constitutional: Denies  fevers, chills or abnormal weight loss Eyes: Denies blurriness of vision Ears, nose, mouth, throat, and face: Denies mucositis or sore throat Respiratory: Denies cough, dyspnea or wheezes Cardiovascular: Denies palpitation, chest discomfort or lower extremity swelling Gastrointestinal:  Denies nausea, heartburn or change in bowel habits Skin: Denies abnormal skin rashes Lymphatics: Denies new lymphadenopathy or easy bruising Neurological:Denies numbness, tingling or new weaknesses Behavioral/Psych: Mood is stable, no new changes  Breast:  denies any pain or lumps or nodules in either breasts All other systems were reviewed with the patient and are negative.  I have reviewed the past medical history, past surgical history, social history and family history with the patient and they are unchanged from previous note.  ALLERGIES:  has No Known Allergies.  MEDICATIONS:  Current Outpatient Prescriptions  Medication Sig Dispense Refill  . ALPRAZolam (XANAX) 1 MG tablet     . cephALEXin (KEFLEX) 500 MG capsule Take 1 capsule (500 mg total) by mouth 4 (four) times daily. 40 capsule 0  . dexamethasone (DECADRON) 4 MG tablet Take 2 tablets by mouth once a day on the day after chemotherapy and then take 2 tablets two times a day for 2 days. 30 tablet 1  . lactulose (CHRONULAC) 10 GM/15ML solution Take 15 mLs (10 g total) by mouth 3 (three) times daily. 240 mL 0  . lidocaine-prilocaine (EMLA) cream Apply to affected area once 30 g 3  . lisinopril-hydrochlorothiazide (PRINZIDE,ZESTORETIC) 20-12.5 MG per tablet Take 1 tablet by mouth 2 (two) times daily. 60 tablet 1  . LORazepam (ATIVAN) 1 MG tablet Take 1 tablet (  1 mg total) by mouth every 6 (six) hours as needed (Nausea or vomiting). 30 tablet 0  . ondansetron (ZOFRAN) 8 MG tablet Take 1 tablet (8 mg total) by mouth 2 (two) times daily as needed. Start on the third day after chemotherapy. 30 tablet 1  . ondansetron (ZOFRAN) 8 MG tablet Take 1 tablet  (8 mg total) by mouth 2 (two) times daily. Start the day after chemo for 3 days. Then take as needed for nausea or vomiting. 30 tablet 1  . oxyCODONE-acetaminophen (ROXICET) 5-325 MG per tablet Take 1-2 tablets by mouth every 4 (four) hours as needed. 50 tablet 0  . prochlorperazine (COMPAZINE) 10 MG tablet Take 1 tablet (10 mg total) by mouth every 6 (six) hours as needed (Nausea or vomiting). 30 tablet 1  . sertraline (ZOLOFT) 50 MG tablet     . Vitamin D, Ergocalciferol, (DRISDOL) 50000 UNITS CAPS capsule Take 1 capsule (50,000 Units total) by mouth every 7 (seven) days. 12 capsule 3   No current facility-administered medications for this visit.    PHYSICAL EXAMINATION: ECOG PERFORMANCE STATUS: 1 - Symptomatic but completely ambulatory  Filed Vitals:   02/25/15 1417  BP: 135/87  Pulse: 67  Temp: 98.4 F (36.9 C)  Resp: 18   Filed Weights   02/25/15 1417  Weight: 168 lb 11.2 oz (76.522 kg)    GENERAL:alert, no distress and comfortable SKIN: skin color, texture, turgor are normal, no rashes or significant lesions EYES: normal, Conjunctiva are pink and non-injected, sclera clear OROPHARYNX:no exudate, no erythema and lips, buccal mucosa, and tongue normal  NECK: supple, thyroid normal size, non-tender, without nodularity LYMPH:  no palpable lymphadenopathy in the cervical, axillary or inguinal LUNGS: clear to auscultation and percussion with normal breathing effort HEART: regular rate & rhythm and no murmurs and no lower extremity edema ABDOMEN:abdomen soft, non-tender and normal bowel sounds Musculoskeletal:no cyanosis of digits and no clubbing  NEURO: alert & oriented x 3 with fluent speech, no focal motor/sensory deficits  LABORATORY DATA:  I have reviewed the data as listed   Chemistry      Component Value Date/Time   NA 140 02/25/2015 1406   K 3.8 02/25/2015 1406   CO2 27 02/25/2015 1406   BUN 11.5 02/25/2015 1406   CREATININE 0.8 02/25/2015 1406      Component  Value Date/Time   CALCIUM 8.9 02/25/2015 1406   ALKPHOS 72 02/25/2015 1406   AST 16 02/25/2015 1406   ALT 18 02/25/2015 1406   BILITOT 0.38 02/25/2015 1406       Lab Results  Component Value Date   WBC 4.6 02/25/2015   HGB 10.6* 02/25/2015   HCT 31.5* 02/25/2015   MCV 88.6 02/25/2015   PLT 247 02/25/2015   NEUTROABS 3.3 02/25/2015   ASSESSMENT & PLAN:  Breast cancer of lower-outer quadrant of right female breast Right breast biopsy 12/03/2014 8:00: Invasive ductal carcinoma, grade 3, ER 0%, PR 0%, 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 Treatment plan: 1. Neo-adjuvant chemotherapy with dose dense Adriamycin and Cytoxan 4 followed by Abraxane once a week 12 ( we wanted to give her carboplatin and Abraxane but insurance did not authorize the treatment) 2. Followed by repeat breast MRI followed by surgery 3. Followed by radiation -------------------------------------------------------------------------------------------------------------------------------------------------- Current treatment: Completed 4 cycles of AC; Today is cycle 2 Abraxane (Carboplatin was  given with cycle 1 but insurance did not authorize it , so we discontinued it further)    Chemotherapy toxicities: 1. Constipation: resolved 2. Fatigue related to chemotherapy day 3-5 3. Insomnia: Continue Ativan 2 tablets at bedtime. Renewed 1 mg daily 4. Anxiety:  5. Nausea and vomiting 1 6. Mouth sores: Much improved  7. Neuropathy after cycle 4 of dose dense Adriamycin and Cytoxan completely resolved currently.  Monitoring closely for toxicities. Return to clinic in 2 weeks for cycle 4/12 Abraxane     No orders of the defined types were placed in this encounter.   The patient has a good understanding of the overall plan. she agrees with it. she will call with any problems that may develop  before the next visit here.   Rulon Eisenmenger, MD

## 2015-02-26 ENCOUNTER — Other Ambulatory Visit: Payer: Self-pay | Admitting: Hematology and Oncology

## 2015-02-27 ENCOUNTER — Other Ambulatory Visit: Payer: Self-pay | Admitting: Hematology and Oncology

## 2015-02-27 NOTE — Telephone Encounter (Signed)
Chart reviewed.  Active treatment

## 2015-02-28 ENCOUNTER — Other Ambulatory Visit: Payer: Self-pay

## 2015-03-04 ENCOUNTER — Other Ambulatory Visit: Payer: Self-pay

## 2015-03-04 ENCOUNTER — Ambulatory Visit (HOSPITAL_BASED_OUTPATIENT_CLINIC_OR_DEPARTMENT_OTHER): Payer: BLUE CROSS/BLUE SHIELD

## 2015-03-04 ENCOUNTER — Other Ambulatory Visit (HOSPITAL_BASED_OUTPATIENT_CLINIC_OR_DEPARTMENT_OTHER): Payer: BLUE CROSS/BLUE SHIELD

## 2015-03-04 ENCOUNTER — Ambulatory Visit: Payer: BLUE CROSS/BLUE SHIELD

## 2015-03-04 VITALS — BP 133/93 | HR 66 | Temp 98.1°F | Resp 18

## 2015-03-04 DIAGNOSIS — C50511 Malignant neoplasm of lower-outer quadrant of right female breast: Secondary | ICD-10-CM

## 2015-03-04 DIAGNOSIS — Z5111 Encounter for antineoplastic chemotherapy: Secondary | ICD-10-CM

## 2015-03-04 LAB — CBC WITH DIFFERENTIAL/PLATELET
BASO%: 1.4 % (ref 0.0–2.0)
Basophils Absolute: 0 10*3/uL (ref 0.0–0.1)
EOS ABS: 0 10*3/uL (ref 0.0–0.5)
EOS%: 0.3 % (ref 0.0–7.0)
HEMATOCRIT: 29.7 % — AB (ref 34.8–46.6)
HGB: 9.9 g/dL — ABNORMAL LOW (ref 11.6–15.9)
LYMPH#: 0.7 10*3/uL — AB (ref 0.9–3.3)
LYMPH%: 20.9 % (ref 14.0–49.7)
MCH: 29.8 pg (ref 25.1–34.0)
MCHC: 33.3 g/dL (ref 31.5–36.0)
MCV: 89.6 fL (ref 79.5–101.0)
MONO#: 0.4 10*3/uL (ref 0.1–0.9)
MONO%: 12.9 % (ref 0.0–14.0)
NEUT%: 64.5 % (ref 38.4–76.8)
NEUTROS ABS: 2.1 10*3/uL (ref 1.5–6.5)
PLATELETS: 247 10*3/uL (ref 145–400)
RBC: 3.32 10*6/uL — AB (ref 3.70–5.45)
RDW: 15 % — ABNORMAL HIGH (ref 11.2–14.5)
WBC: 3.2 10*3/uL — ABNORMAL LOW (ref 3.9–10.3)

## 2015-03-04 LAB — COMPREHENSIVE METABOLIC PANEL (CC13)
ALT: 25 U/L (ref 0–55)
ANION GAP: 5 meq/L (ref 3–11)
AST: 20 U/L (ref 5–34)
Albumin: 3.5 g/dL (ref 3.5–5.0)
Alkaline Phosphatase: 67 U/L (ref 40–150)
BILIRUBIN TOTAL: 0.35 mg/dL (ref 0.20–1.20)
BUN: 7.9 mg/dL (ref 7.0–26.0)
CALCIUM: 8.9 mg/dL (ref 8.4–10.4)
CO2: 25 meq/L (ref 22–29)
CREATININE: 0.6 mg/dL (ref 0.6–1.1)
Chloride: 111 mEq/L — ABNORMAL HIGH (ref 98–109)
Glucose: 86 mg/dl (ref 70–140)
Potassium: 3.7 mEq/L (ref 3.5–5.1)
Sodium: 140 mEq/L (ref 136–145)
TOTAL PROTEIN: 6.2 g/dL — AB (ref 6.4–8.3)

## 2015-03-04 MED ORDER — SODIUM CHLORIDE 0.9 % IJ SOLN
10.0000 mL | INTRAMUSCULAR | Status: DC | PRN
  Administered 2015-03-04: 10 mL
  Filled 2015-03-04: qty 10

## 2015-03-04 MED ORDER — HEPARIN SOD (PORK) LOCK FLUSH 100 UNIT/ML IV SOLN
500.0000 [IU] | Freq: Once | INTRAVENOUS | Status: AC | PRN
  Administered 2015-03-04: 500 [IU]
  Filled 2015-03-04: qty 5

## 2015-03-04 MED ORDER — SODIUM CHLORIDE 0.9 % IV SOLN
Freq: Once | INTRAVENOUS | Status: AC
  Administered 2015-03-04: 15:00:00 via INTRAVENOUS

## 2015-03-04 MED ORDER — SODIUM CHLORIDE 0.9 % IV SOLN
Freq: Once | INTRAVENOUS | Status: AC
  Administered 2015-03-04: 15:00:00 via INTRAVENOUS
  Filled 2015-03-04: qty 4

## 2015-03-04 MED ORDER — ALPRAZOLAM 1 MG PO TABS: 1.0000 mg | ORAL_TABLET | Freq: Two times a day (BID) | ORAL | Status: DC | PRN

## 2015-03-04 MED ORDER — PACLITAXEL PROTEIN-BOUND CHEMO INJECTION 100 MG
80.0000 mg/m2 | Freq: Once | INTRAVENOUS | Status: AC
  Administered 2015-03-04: 150 mg via INTRAVENOUS
  Filled 2015-03-04: qty 30

## 2015-03-04 NOTE — Patient Instructions (Addendum)
[facility] Discharge Instructions for Patients Receiving Chemotherapy  Today you received the following chemotherapy agents Abraxane To help prevent nausea and vomiting after your treatment, we encourage you to take your nausea medication as prescribed.   If you develop nausea and vomiting that is not controlled by your nausea medication, call the clinic.   BELOW ARE SYMPTOMS THAT SHOULD BE REPORTED IMMEDIATELY:  *FEVER GREATER THAN 100.5 F  *CHILLS WITH OR WITHOUT FEVER  NAUSEA AND VOMITING THAT IS NOT CONTROLLED WITH YOUR NAUSEA MEDICATION  *UNUSUAL SHORTNESS OF BREATH  *UNUSUAL BRUISING OR BLEEDING  TENDERNESS IN MOUTH AND THROAT WITH OR WITHOUT PRESENCE OF ULCERS  *URINARY PROBLEMS  *BOWEL PROBLEMS  UNUSUAL RASH Items with * indicate a potential emergency and should be followed up as soon as possible.  Feel free to call the clinic you have any questions or concerns. The clinic phone number is [phone].  Please show the CHEMO ALERT CARD at check-in to the Emergency Department and triage nurse.   

## 2015-03-11 ENCOUNTER — Ambulatory Visit: Payer: BLUE CROSS/BLUE SHIELD

## 2015-03-11 ENCOUNTER — Ambulatory Visit (HOSPITAL_BASED_OUTPATIENT_CLINIC_OR_DEPARTMENT_OTHER): Payer: BLUE CROSS/BLUE SHIELD

## 2015-03-11 ENCOUNTER — Ambulatory Visit (HOSPITAL_BASED_OUTPATIENT_CLINIC_OR_DEPARTMENT_OTHER): Payer: BLUE CROSS/BLUE SHIELD | Admitting: Hematology and Oncology

## 2015-03-11 ENCOUNTER — Ambulatory Visit: Payer: BLUE CROSS/BLUE SHIELD | Admitting: Hematology and Oncology

## 2015-03-11 ENCOUNTER — Telehealth: Payer: Self-pay | Admitting: Hematology and Oncology

## 2015-03-11 ENCOUNTER — Other Ambulatory Visit: Payer: BLUE CROSS/BLUE SHIELD

## 2015-03-11 ENCOUNTER — Encounter: Payer: Self-pay | Admitting: Hematology and Oncology

## 2015-03-11 ENCOUNTER — Other Ambulatory Visit (HOSPITAL_BASED_OUTPATIENT_CLINIC_OR_DEPARTMENT_OTHER): Payer: BLUE CROSS/BLUE SHIELD

## 2015-03-11 VITALS — BP 113/74 | HR 79 | Temp 98.4°F | Resp 18 | Ht 63.0 in | Wt 170.4 lb

## 2015-03-11 DIAGNOSIS — D72818 Other decreased white blood cell count: Secondary | ICD-10-CM

## 2015-03-11 DIAGNOSIS — C50511 Malignant neoplasm of lower-outer quadrant of right female breast: Secondary | ICD-10-CM

## 2015-03-11 DIAGNOSIS — Z5111 Encounter for antineoplastic chemotherapy: Secondary | ICD-10-CM | POA: Diagnosis not present

## 2015-03-11 DIAGNOSIS — Z171 Estrogen receptor negative status [ER-]: Secondary | ICD-10-CM | POA: Diagnosis not present

## 2015-03-11 DIAGNOSIS — G620 Drug-induced polyneuropathy: Secondary | ICD-10-CM | POA: Diagnosis not present

## 2015-03-11 LAB — COMPREHENSIVE METABOLIC PANEL (CC13)
ALBUMIN: 3.7 g/dL (ref 3.5–5.0)
ALK PHOS: 68 U/L (ref 40–150)
ALT: 54 U/L (ref 0–55)
ANION GAP: 6 meq/L (ref 3–11)
AST: 36 U/L — ABNORMAL HIGH (ref 5–34)
BUN: 9 mg/dL (ref 7.0–26.0)
CALCIUM: 9 mg/dL (ref 8.4–10.4)
CO2: 27 mEq/L (ref 22–29)
Chloride: 108 mEq/L (ref 98–109)
Creatinine: 0.7 mg/dL (ref 0.6–1.1)
Glucose: 90 mg/dl (ref 70–140)
POTASSIUM: 3.4 meq/L — AB (ref 3.5–5.1)
Sodium: 141 mEq/L (ref 136–145)
Total Bilirubin: 0.54 mg/dL (ref 0.20–1.20)
Total Protein: 6.9 g/dL (ref 6.4–8.3)

## 2015-03-11 LAB — CBC WITH DIFFERENTIAL/PLATELET
BASO%: 1.1 % (ref 0.0–2.0)
BASOS ABS: 0 10*3/uL (ref 0.0–0.1)
EOS ABS: 0 10*3/uL (ref 0.0–0.5)
EOS%: 0.7 % (ref 0.0–7.0)
HEMATOCRIT: 31.6 % — AB (ref 34.8–46.6)
HEMOGLOBIN: 10.7 g/dL — AB (ref 11.6–15.9)
LYMPH#: 0.8 10*3/uL — AB (ref 0.9–3.3)
LYMPH%: 27.7 % (ref 14.0–49.7)
MCH: 30.5 pg (ref 25.1–34.0)
MCHC: 33.9 g/dL (ref 31.5–36.0)
MCV: 90 fL (ref 79.5–101.0)
MONO#: 0.2 10*3/uL (ref 0.1–0.9)
MONO%: 8.8 % (ref 0.0–14.0)
NEUT#: 1.7 10*3/uL (ref 1.5–6.5)
NEUT%: 61.7 % (ref 38.4–76.8)
Platelets: 204 10*3/uL (ref 145–400)
RBC: 3.51 10*6/uL — ABNORMAL LOW (ref 3.70–5.45)
RDW: 14.8 % — AB (ref 11.2–14.5)
WBC: 2.7 10*3/uL — ABNORMAL LOW (ref 3.9–10.3)

## 2015-03-11 MED ORDER — CEPHALEXIN 500 MG PO CAPS: 500.0000 mg | ORAL_CAPSULE | Freq: Four times a day (QID) | ORAL | Status: DC

## 2015-03-11 MED ORDER — PACLITAXEL PROTEIN-BOUND CHEMO INJECTION 100 MG
80.0000 mg/m2 | Freq: Once | INTRAVENOUS | Status: AC
  Administered 2015-03-11: 150 mg via INTRAVENOUS
  Filled 2015-03-11: qty 30

## 2015-03-11 MED ORDER — SODIUM CHLORIDE 0.9 % IV SOLN
Freq: Once | INTRAVENOUS | Status: AC
  Administered 2015-03-11: 16:00:00 via INTRAVENOUS

## 2015-03-11 MED ORDER — SODIUM CHLORIDE 0.9 % IV SOLN
Freq: Once | INTRAVENOUS | Status: AC
  Administered 2015-03-11: 16:00:00 via INTRAVENOUS
  Filled 2015-03-11: qty 4

## 2015-03-11 MED ORDER — SODIUM CHLORIDE 0.9 % IJ SOLN
10.0000 mL | INTRAMUSCULAR | Status: DC | PRN
  Administered 2015-03-11: 10 mL
  Filled 2015-03-11: qty 10

## 2015-03-11 MED ORDER — HEPARIN SOD (PORK) LOCK FLUSH 100 UNIT/ML IV SOLN
500.0000 [IU] | Freq: Once | INTRAVENOUS | Status: AC | PRN
  Administered 2015-03-11: 500 [IU]
  Filled 2015-03-11: qty 5

## 2015-03-11 MED ORDER — LISINOPRIL-HYDROCHLOROTHIAZIDE 20-12.5 MG PO TABS: 1.0000 | ORAL_TABLET | Freq: Two times a day (BID) | ORAL | Status: DC

## 2015-03-11 NOTE — Progress Notes (Signed)
Patient Care Team: Secundino Ginger, [name], [name], [name], [name], [name], [name], NP as Nurse Practitioner (Nurse Practitioner)  DIAGNOSIS: Breast cancer of lower-outer quadrant of right female breast [facility])   Staging form: Breast, AJCC 7th Edition     Clinical stage from 12/11/2014: Stage IIA (T2, N0, M0) - Unsigned       Staging comments: Staged at breast conference on 7.20.16    SUMMARY OF ONCOLOGIC HISTORY:   Breast cancer of lower-outer quadrant of right female breast (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 Today's cycle 4 of Abraxane. She appears to be tolerating it fairly well. Denies any neuropathy. Denies any nausea or vomiting. Does complain of fatigue. She had a nail infection and was prescribed Keflex but she apparently did not take it all as prescribed. Instead she recently started taking it because her symptoms got worse. The nail appears to have small amount of whitish discharge. She tells me that  it is 99% better than what it used to be.  REVIEW OF SYSTEMS:   Constitutional: Denies fevers, chills or abnormal weight loss, complains of fatigue Eyes: Denies blurriness of vision Ears, nose, mouth, throat, and face: Denies mucositis or sore throat Respiratory: Denies cough, dyspnea or wheezes Cardiovascular: Denies palpitation, chest discomfort or lower extremity swelling Gastrointestinal:  Denies nausea, heartburn or change in bowel habits Skin: Denies abnormal skin rashes Lymphatics: Denies new lymphadenopathy or easy bruising Neurological:Denies numbness, tingling or new weaknesses Behavioral/Psych: Mood is stable, no new changes  Extremities: Left great toe nailbed infection All other systems were reviewed with the patient and are negative.  I have reviewed the past medical history, past surgical history, social history and family history with the patient and they are unchanged from previous note.  ALLERGIES:  has No Known Allergies.  MEDICATIONS:  Current Outpatient Prescriptions  Medication Sig Dispense Refill  . ALPRAZolam (XANAX) 1 MG tablet Take 1 tablet (1 mg total) by mouth 2 (two) times daily as needed for anxiety. 30 tablet 0  . cephALEXin (KEFLEX) 500 MG capsule Take 1 capsule (500 mg total) by mouth 4 (four) times daily. 20 capsule 0  . dexamethasone (DECADRON) 4 MG tablet Take 2 tablets by mouth once a day on the day after chemotherapy and then take 2 tablets two times a day for 2 days. 30 tablet 1  . lactulose (CHRONULAC) 10 GM/15ML solution Take 15 mLs (10 g total) by mouth 3 (three) times daily. 240 mL 0  . lidocaine-prilocaine (EMLA) cream Apply to affected area once 30 g 3  . lisinopril-hydrochlorothiazide (  PRINZIDE,ZESTORETIC) 20-12.5 MG tablet Take 1 tablet by mouth 2 (two) times daily. 60 tablet 6  . LORazepam (ATIVAN) 1 MG tablet TAKE ONE TABLET BY MOUTH EVERY 6 HOURS AS NEEDED FOR NAUSEA AND VOMITING 30 tablet 0  . ondansetron (ZOFRAN) 8 MG tablet Take 1  tablet (8 mg total) by mouth 2 (two) times daily as needed. Start on the third day after chemotherapy. 30 tablet 1  . oxyCODONE-acetaminophen (ROXICET) 5-325 MG per tablet Take 1-2 tablets by mouth every 4 (four) hours as needed. 50 tablet 0  . prochlorperazine (COMPAZINE) 10 MG tablet Take 1 tablet (10 mg total) by mouth every 6 (six) hours as needed (Nausea or vomiting). 30 tablet 1  . sertraline (ZOLOFT) 50 MG tablet     . Vitamin D, Ergocalciferol, (DRISDOL) 50000 UNITS CAPS capsule Take 1 capsule (50,000 Units total) by mouth every 7 (seven) days. 12 capsule 3   No current facility-administered medications for this visit.   Facility-Administered Medications Ordered in Other Visits  Medication Dose Route Frequency Provider Last Rate Last Dose  . ondansetron (ZOFRAN) 8 mg, dexamethasone (DECADRON) 10 mg in sodium chloride 0.9 % 50 mL IVPB   Intravenous Once [name], MD      . sodium chloride 0.9 % injection 10 mL  10 mL Intracatheter PRN [name], MD   10 mL at 03/04/15 1638    PHYSICAL EXAMINATION: ECOG PERFORMANCE STATUS: 1 - Symptomatic but completely ambulatory  Filed Vitals:   03/11/15 1425  BP: 113/74  Pulse: 79  Temp: 98.4 F (36.9 C)  Resp: 18   Filed Weights   03/11/15 1425  Weight: 170 lb 6.4 oz (77.293 kg)    GENERAL:alert, no distress and comfortable SKIN: skin color, texture, turgor are normal, no rashes or significant lesions EYES: normal, Conjunctiva are pink and non-injected, sclera clear OROPHARYNX:no exudate, no erythema and lips, buccal mucosa, and tongue normal  NECK: supple, thyroid normal size, non-tender, without nodularity LYMPH:  no palpable lymphadenopathy in the cervical, axillary or inguinal LUNGS: clear to auscultation and percussion with normal breathing effort HEART: regular rate & rhythm and no murmurs and no lower extremity edema ABDOMEN:abdomen soft, non-tender and normal bowel sounds Musculoskeletal:no cyanosis of digits and no  clubbing  NEURO: alert & oriented x 3 with fluent speech, no focal motor/sensory deficits  LABORATORY DATA:  I have reviewed the data as listed   Chemistry      Component Value Date/Time   NA 141 03/11/2015 1431   K 3.4* 03/11/2015 1431   CO2 27 03/11/2015 1431   BUN 9.0 03/11/2015 1431   CREATININE 0.7 03/11/2015 1431      Component Value Date/Time   CALCIUM 9.0 03/11/2015 1431   ALKPHOS 68 03/11/2015 1431   AST 36* 03/11/2015 1431   ALT 54 03/11/2015 1431   BILITOT 0.54 03/11/2015 1431       Lab Results  Component Value Date   WBC 2.7* 03/11/2015   HGB 10.7* 03/11/2015   HCT 31.6* 03/11/2015   MCV 90.0 03/11/2015   PLT 204 03/11/2015   NEUTROABS 1.7 03/11/2015   ASSESSMENT & PLAN:  Breast cancer of lower-outer quadrant of right female breast Right breast biopsy 12/03/2014 8:00: Invasive ductal carcinoma, grade 3, ER 0%, PR 0%, 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 Treatment plan: 1. Neo-adjuvant chemotherapy with dose  dense Adriamycin and Cytoxan 4 followed by Abraxane once a week 12 ( we wanted to give her carboplatin and Abraxane but insurance did not authorize the treatment) 2. Followed by repeat breast MRI followed by surgery 3. Followed by radiation -------------------------------------------------------------------------------------------------------------------------------------------------- Current treatment: Completed 4 cycles of AC; Today is cycle 4 Abraxane (Carboplatin was given with cycle 1 but insurance did not authorize it , so we discontinued it further)   Chemotherapy toxicities: 1. Constipation: resolved 2. Fatigue related to chemotherapy day 3-5 3. Insomnia: Continue Ativan 2 tablets at bedtime. Renewed 1 mg daily 4. Anxiety:  5. Nausea and vomiting 1 6. Mouth sores: Much improved  7. Neuropathy after cycle 4 of dose  dense Adriamycin and Cytoxan completely resolved currently. 8. Leukopenia: Slowly decreasing with each treatment. I discussed with her that if it continues to decline below 1.5, we might need to reduce the dosage of chemotherapy.  Monitoring closely for toxicities. Return to clinic in 2 weeks for cycle 6/12 Abraxane   No orders of the defined types were placed in this encounter.   The patient has a good understanding of the overall plan. she agrees with it. she will call with any problems that may develop before the next visit here.   Rulon Eisenmenger, MD 03/11/2015

## 2015-03-11 NOTE — Patient Instructions (Signed)
[facility] Discharge Instructions for Patients Receiving Chemotherapy  Today you received the following chemotherapy agents abraxane  To help prevent nausea and vomiting after your treatment, we encourage you to take your nausea medication    If you develop nausea and vomiting that is not controlled by your nausea medication, call the clinic.   BELOW ARE SYMPTOMS THAT SHOULD BE REPORTED IMMEDIATELY:  *FEVER GREATER THAN 100.5 F  *CHILLS WITH OR WITHOUT FEVER  NAUSEA AND VOMITING THAT IS NOT CONTROLLED WITH YOUR NAUSEA MEDICATION  *UNUSUAL SHORTNESS OF BREATH  *UNUSUAL BRUISING OR BLEEDING  TENDERNESS IN MOUTH AND THROAT WITH OR WITHOUT PRESENCE OF ULCERS  *URINARY PROBLEMS  *BOWEL PROBLEMS  UNUSUAL RASH Items with * indicate a potential emergency and should be followed up as soon as possible.  Feel free to call the clinic you have any questions or concerns. The clinic phone number is [phone].  Please show the CHEMO ALERT CARD at check-in to the Emergency Department and triage nurse.   

## 2015-03-11 NOTE — Assessment & Plan Note (Signed)
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 Treatment plan: 1. Neo-adjuvant chemotherapy with dose dense Adriamycin and Cytoxan 4 followed by Abraxane once a week 12 ( we wanted to give her carboplatin and Abraxane but insurance did not authorize the treatment) 2. Followed by repeat breast MRI followed by surgery 3. Followed by radiation -------------------------------------------------------------------------------------------------------------------------------------------------- Current treatment: Completed 4 cycles of AC; Today is cycle 4 Abraxane (Carboplatin was given with cycle 1 but insurance did not authorize it , so we discontinued it further)   Chemotherapy toxicities: 1. Constipation: resolved 2. Fatigue related to chemotherapy day 3-5 3. Insomnia: Continue Ativan 2 tablets at bedtime. Renewed 1 mg daily 4. Anxiety:  5. Nausea and vomiting 1 6. Mouth sores: Much improved  7. Neuropathy after cycle 4 of dose dense Adriamycin and Cytoxan completely resolved currently.  Monitoring closely for toxicities. Return to clinic in 2 weeks for cycle 6/12 Abraxane

## 2015-03-11 NOTE — Telephone Encounter (Signed)
Appointments made and avs printed for pateint °

## 2015-03-11 NOTE — Addendum Note (Signed)
Addended by: Prentiss Bells on: 03/11/2015 05:08 PM   Modules accepted: Medications

## 2015-03-18 ENCOUNTER — Ambulatory Visit (HOSPITAL_BASED_OUTPATIENT_CLINIC_OR_DEPARTMENT_OTHER): Payer: BLUE CROSS/BLUE SHIELD

## 2015-03-18 ENCOUNTER — Ambulatory Visit: Payer: BLUE CROSS/BLUE SHIELD

## 2015-03-18 ENCOUNTER — Other Ambulatory Visit (HOSPITAL_BASED_OUTPATIENT_CLINIC_OR_DEPARTMENT_OTHER): Payer: BLUE CROSS/BLUE SHIELD

## 2015-03-18 DIAGNOSIS — D72818 Other decreased white blood cell count: Secondary | ICD-10-CM | POA: Diagnosis not present

## 2015-03-18 DIAGNOSIS — C50511 Malignant neoplasm of lower-outer quadrant of right female breast: Secondary | ICD-10-CM | POA: Diagnosis not present

## 2015-03-18 DIAGNOSIS — Z5111 Encounter for antineoplastic chemotherapy: Secondary | ICD-10-CM

## 2015-03-18 LAB — CBC WITH DIFFERENTIAL/PLATELET
BASO%: 0.8 % (ref 0.0–2.0)
BASOS ABS: 0 10*3/uL (ref 0.0–0.1)
EOS ABS: 0 10*3/uL (ref 0.0–0.5)
EOS%: 1.4 % (ref 0.0–7.0)
HCT: 30.6 % — ABNORMAL LOW (ref 34.8–46.6)
HGB: 10.4 g/dL — ABNORMAL LOW (ref 11.6–15.9)
LYMPH%: 23.7 % (ref 14.0–49.7)
MCH: 30.8 pg (ref 25.1–34.0)
MCHC: 33.9 g/dL (ref 31.5–36.0)
MCV: 91 fL (ref 79.5–101.0)
MONO#: 0.3 10*3/uL (ref 0.1–0.9)
MONO%: 8.6 % (ref 0.0–14.0)
NEUT#: 2.2 10*3/uL (ref 1.5–6.5)
NEUT%: 65.5 % (ref 38.4–76.8)
Platelets: 219 10*3/uL (ref 145–400)
RBC: 3.36 10*6/uL — AB (ref 3.70–5.45)
RDW: 16.2 % — AB (ref 11.2–14.5)
WBC: 3.4 10*3/uL — AB (ref 3.9–10.3)
lymph#: 0.8 10*3/uL — ABNORMAL LOW (ref 0.9–3.3)

## 2015-03-18 LAB — COMPREHENSIVE METABOLIC PANEL (CC13)
ALT: 41 U/L (ref 0–55)
AST: 24 U/L (ref 5–34)
Albumin: 3.7 g/dL (ref 3.5–5.0)
Alkaline Phosphatase: 81 U/L (ref 40–150)
Anion Gap: 6 mEq/L (ref 3–11)
BUN: 8.1 mg/dL (ref 7.0–26.0)
CHLORIDE: 109 meq/L (ref 98–109)
CO2: 25 meq/L (ref 22–29)
Calcium: 9.4 mg/dL (ref 8.4–10.4)
Creatinine: 0.7 mg/dL (ref 0.6–1.1)
GLUCOSE: 99 mg/dL (ref 70–140)
POTASSIUM: 3.8 meq/L (ref 3.5–5.1)
SODIUM: 140 meq/L (ref 136–145)
Total Bilirubin: 0.37 mg/dL (ref 0.20–1.20)
Total Protein: 6.8 g/dL (ref 6.4–8.3)

## 2015-03-18 MED ORDER — HEPARIN SOD (PORK) LOCK FLUSH 100 UNIT/ML IV SOLN
500.0000 [IU] | Freq: Once | INTRAVENOUS | Status: AC | PRN
  Administered 2015-03-18: 500 [IU]
  Filled 2015-03-18: qty 5

## 2015-03-18 MED ORDER — SODIUM CHLORIDE 0.9 % IJ SOLN
10.0000 mL | INTRAMUSCULAR | Status: DC | PRN
  Administered 2015-03-18: 10 mL
  Filled 2015-03-18: qty 10

## 2015-03-18 MED ORDER — SODIUM CHLORIDE 0.9 % IV SOLN
Freq: Once | INTRAVENOUS | Status: AC
  Administered 2015-03-18: 15:00:00 via INTRAVENOUS
  Filled 2015-03-18: qty 4

## 2015-03-18 MED ORDER — PACLITAXEL PROTEIN-BOUND CHEMO INJECTION 100 MG
80.0000 mg/m2 | Freq: Once | INTRAVENOUS | Status: AC
  Administered 2015-03-18: 150 mg via INTRAVENOUS
  Filled 2015-03-18: qty 30

## 2015-03-18 MED ORDER — SODIUM CHLORIDE 0.9 % IV SOLN
Freq: Once | INTRAVENOUS | Status: AC
  Administered 2015-03-18: 14:00:00 via INTRAVENOUS

## 2015-03-18 NOTE — Progress Notes (Signed)
Pt in for D1C5 Abraxane. Reports intermittent numbness in thumb forefinger and middle finger bilaterally. Does not interfere with function. First noticed this Day 4 after Cycle 4 of Abraxane. Pt denies any changes in her feet; constant soreness in "both pinky toes." L great toenail feels better, pt reports she will take her last antibiotic pill today.

## 2015-03-18 NOTE — Patient Instructions (Signed)
[facility] Discharge Instructions for Patients Receiving Chemotherapy  Today you received the following chemotherapy agents: Abraxane.  To help prevent nausea and vomiting after your treatment, we encourage you to take your nausea medication: Zofran. Take one every 8 hours as needed.   If you develop nausea and vomiting that is not controlled by your nausea medication, call the clinic.   BELOW ARE SYMPTOMS THAT SHOULD BE REPORTED IMMEDIATELY:  *FEVER GREATER THAN 100.5 F  *CHILLS WITH OR WITHOUT FEVER  NAUSEA AND VOMITING THAT IS NOT CONTROLLED WITH YOUR NAUSEA MEDICATION  *UNUSUAL SHORTNESS OF BREATH  *UNUSUAL BRUISING OR BLEEDING  TENDERNESS IN MOUTH AND THROAT WITH OR WITHOUT PRESENCE OF ULCERS  *URINARY PROBLEMS  *BOWEL PROBLEMS  UNUSUAL RASH Items with * indicate a potential emergency and should be followed up as soon as possible.  Feel free to call the clinic should you have any questions or concerns. The clinic phone number is [phone].  Please show the CHEMO ALERT CARD at check-in to the Emergency Department and triage nurse.   

## 2015-03-24 NOTE — Assessment & Plan Note (Signed)
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 Treatment plan: 1. Neo-adjuvant chemotherapy with dose dense Adriamycin and Cytoxan 4 followed by Abraxane once a week 12 ( we wanted to give her carboplatin and Abraxane but insurance did not authorize the treatment) 2. Followed by repeat breast MRI followed by surgery 3. Followed by radiation -------------------------------------------------------------------------------------------------------------------------------------------------- Current treatment: Completed 4 cycles of AC; Today is cycle 6 Abraxane (Carboplatin was given with cycle 1 but insurance did not authorize it , so we discontinued it further)   Chemotherapy toxicities: 1. Constipation: resolved 2. Fatigue related to chemotherapy day 3-5 3. Insomnia: Continue Ativan 2 tablets at bedtime. Renewed 1 mg daily 4. Anxiety:  5. Nausea and vomiting 1 6. Mouth sores: Much improved  7. Neuropathy after cycle 4 of dose dense Adriamycin and Cytoxan completely resolved currently. 8. Leukopenia: Slowly decreasing with each treatment. I discussed with her that if it continues to decline below 1.5, we might need to reduce the dosage of chemotherapy.  Monitoring closely for toxicities. Return to clinic in 2 weeks for cycle 8/12 Abraxane

## 2015-03-25 ENCOUNTER — Ambulatory Visit: Payer: BLUE CROSS/BLUE SHIELD

## 2015-03-25 ENCOUNTER — Telehealth: Payer: Self-pay | Admitting: Hematology and Oncology

## 2015-03-25 ENCOUNTER — Ambulatory Visit (HOSPITAL_BASED_OUTPATIENT_CLINIC_OR_DEPARTMENT_OTHER): Payer: BLUE CROSS/BLUE SHIELD | Admitting: Hematology and Oncology

## 2015-03-25 ENCOUNTER — Encounter: Payer: Self-pay | Admitting: Hematology and Oncology

## 2015-03-25 ENCOUNTER — Other Ambulatory Visit: Payer: BLUE CROSS/BLUE SHIELD

## 2015-03-25 ENCOUNTER — Other Ambulatory Visit (HOSPITAL_BASED_OUTPATIENT_CLINIC_OR_DEPARTMENT_OTHER): Payer: BLUE CROSS/BLUE SHIELD

## 2015-03-25 ENCOUNTER — Encounter: Payer: Self-pay | Admitting: *Deleted

## 2015-03-25 ENCOUNTER — Ambulatory Visit (HOSPITAL_BASED_OUTPATIENT_CLINIC_OR_DEPARTMENT_OTHER): Payer: BLUE CROSS/BLUE SHIELD

## 2015-03-25 VITALS — BP 155/96 | HR 73 | Temp 98.9°F | Resp 18 | Ht 63.0 in | Wt 172.0 lb

## 2015-03-25 DIAGNOSIS — D72818 Other decreased white blood cell count: Secondary | ICD-10-CM

## 2015-03-25 DIAGNOSIS — C50511 Malignant neoplasm of lower-outer quadrant of right female breast: Secondary | ICD-10-CM

## 2015-03-25 DIAGNOSIS — Z171 Estrogen receptor negative status [ER-]: Secondary | ICD-10-CM

## 2015-03-25 DIAGNOSIS — Z5111 Encounter for antineoplastic chemotherapy: Secondary | ICD-10-CM | POA: Diagnosis not present

## 2015-03-25 LAB — COMPREHENSIVE METABOLIC PANEL (CC13)
ALBUMIN: 3.7 g/dL (ref 3.5–5.0)
ALK PHOS: 72 U/L (ref 40–150)
ALT: 37 U/L (ref 0–55)
AST: 27 U/L (ref 5–34)
Anion Gap: 6 mEq/L (ref 3–11)
BUN: 7.2 mg/dL (ref 7.0–26.0)
CALCIUM: 9.2 mg/dL (ref 8.4–10.4)
CO2: 25 mEq/L (ref 22–29)
Chloride: 109 mEq/L (ref 98–109)
Creatinine: 0.7 mg/dL (ref 0.6–1.1)
Glucose: 110 mg/dl (ref 70–140)
POTASSIUM: 3.7 meq/L (ref 3.5–5.1)
Sodium: 140 mEq/L (ref 136–145)
Total Bilirubin: 0.4 mg/dL (ref 0.20–1.20)
Total Protein: 6.8 g/dL (ref 6.4–8.3)

## 2015-03-25 LAB — CBC WITH DIFFERENTIAL/PLATELET
BASO%: 0.6 % (ref 0.0–2.0)
BASOS ABS: 0 10*3/uL (ref 0.0–0.1)
EOS%: 2.2 % (ref 0.0–7.0)
Eosinophils Absolute: 0.1 10*3/uL (ref 0.0–0.5)
HEMATOCRIT: 31.3 % — AB (ref 34.8–46.6)
HEMOGLOBIN: 10.5 g/dL — AB (ref 11.6–15.9)
LYMPH#: 0.9 10*3/uL (ref 0.9–3.3)
LYMPH%: 23.6 % (ref 14.0–49.7)
MCH: 30.6 pg (ref 25.1–34.0)
MCHC: 33.4 g/dL (ref 31.5–36.0)
MCV: 91.6 fL (ref 79.5–101.0)
MONO#: 0.3 10*3/uL (ref 0.1–0.9)
MONO%: 7.7 % (ref 0.0–14.0)
NEUT#: 2.4 10*3/uL (ref 1.5–6.5)
NEUT%: 65.9 % (ref 38.4–76.8)
Platelets: 208 10*3/uL (ref 145–400)
RBC: 3.42 10*6/uL — ABNORMAL LOW (ref 3.70–5.45)
RDW: 16.7 % — AB (ref 11.2–14.5)
WBC: 3.7 10*3/uL — ABNORMAL LOW (ref 3.9–10.3)

## 2015-03-25 MED ORDER — DEXAMETHASONE SODIUM PHOSPHATE 100 MG/10ML IJ SOLN
Freq: Once | INTRAMUSCULAR | Status: AC
  Administered 2015-03-25: 16:00:00 via INTRAVENOUS
  Filled 2015-03-25: qty 4

## 2015-03-25 MED ORDER — HEPARIN SOD (PORK) LOCK FLUSH 100 UNIT/ML IV SOLN
500.0000 [IU] | Freq: Once | INTRAVENOUS | Status: AC | PRN
  Administered 2015-03-25: 500 [IU]
  Filled 2015-03-25: qty 5

## 2015-03-25 MED ORDER — SODIUM CHLORIDE 0.9 % IJ SOLN
10.0000 mL | INTRAMUSCULAR | Status: DC | PRN
  Administered 2015-03-25: 10 mL
  Filled 2015-03-25: qty 10

## 2015-03-25 MED ORDER — PACLITAXEL PROTEIN-BOUND CHEMO INJECTION 100 MG
65.0000 mg/m2 | Freq: Once | INTRAVENOUS | Status: AC
Start: 2015-03-25 — End: 2015-03-25
  Administered 2015-03-25: 125 mg via INTRAVENOUS
  Filled 2015-03-25: qty 25

## 2015-03-25 MED ORDER — SODIUM CHLORIDE 0.9 % IV SOLN
Freq: Once | INTRAVENOUS | Status: AC
  Administered 2015-03-25: 16:00:00 via INTRAVENOUS

## 2015-03-25 MED ORDER — VITAMIN D (ERGOCALCIFEROL) 1.25 MG (50000 UNIT) PO CAPS: 50000.0000 [IU] | ORAL_CAPSULE | ORAL | Status: DC

## 2015-03-25 MED ORDER — LISINOPRIL-HYDROCHLOROTHIAZIDE 20-12.5 MG PO TABS: 1.0000 | ORAL_TABLET | Freq: Two times a day (BID) | ORAL | Status: DC

## 2015-03-25 MED ORDER — ALPRAZOLAM 1 MG PO TABS: 2.0000 mg | ORAL_TABLET | Freq: Every day | ORAL | Status: DC

## 2015-03-25 NOTE — Progress Notes (Signed)
Patient Care Team: Secundino Ginger, [name], [name], [name], [name], [name], [name], NP as Nurse Practitioner (Nurse Practitioner)  DIAGNOSIS: Breast cancer of lower-outer quadrant of right female breast [facility])   Staging form: Breast, AJCC 7th Edition     Clinical stage from 12/11/2014: Stage IIA (T2, N0, M0) - Unsigned       Staging comments: Staged at breast conference on 7.20.16    SUMMARY OF ONCOLOGIC HISTORY:   Breast cancer of lower-outer quadrant of right female breast ([place])   12/03/2014 Mammogram Right breast mass 1.9 cm it o'clock position 8 cm depth from the nipple   12/03/2014 Initial Diagnosis Right breast biopsy 8:00: Invasive ductal carcinoma, grade 3, ER 0%, PR 0%, 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 Today's cycle 6 of Abraxane. After the last cycle she has noted neuropathy the tips of the fingers especially 3 of the fingers. They come and go. The right great toe infection has healed very well. She still has mild discharge but it is whitish in color. The nail has fallen out.  REVIEW OF SYSTEMS:   Constitutional: Denies fevers, chills or abnormal weight loss Eyes: Denies blurriness of  vision Ears, nose, mouth, throat, and face: Denies mucositis or sore throat Respiratory: Denies cough, dyspnea or wheezes Cardiovascular: Denies palpitation, chest discomfort or lower extremity swelling Gastrointestinal:  Denies nausea, heartburn or change in bowel habits Skin: Denies abnormal skin rashes Lymphatics: Denies new lymphadenopathy or easy bruising Neurological: Tingling and numbness of the tips of the fingers and toes Behavioral/Psych: Mood is stable, no new changes   All other systems were reviewed with the patient and are negative.  I have reviewed the past medical history, past surgical history, social history and family history with the patient and they are unchanged from previous note.  ALLERGIES:  has No Known Allergies.  MEDICATIONS:  Current Outpatient Prescriptions  Medication Sig Dispense Refill  . ALPRAZolam (XANAX) 1 MG tablet Take 2 tablets (2 mg total) by mouth at bedtime. 60 tablet 1  . cephALEXin (KEFLEX) 500 MG capsule Take 1 capsule (500 mg total) by mouth 4 (four) times daily. 20 capsule 0  . dexamethasone (DECADRON) 4 MG tablet Take 2 tablets by mouth once a day on the day after chemotherapy and then take 2 tablets two times a day for 2 days. 30 tablet 1  . lactulose (CHRONULAC) 10 GM/15ML solution Take 15 mLs (10 g total) by mouth 3 (three) times daily. 240 mL 0  . lidocaine-prilocaine (EMLA) cream Apply to affected area once 30 g 3  . lisinopril-hydrochlorothiazide (PRINZIDE,ZESTORETIC) 20-12.5 MG tablet Take 1 tablet by mouth 2 (two) times daily. 60 tablet 6  . LORazepam (ATIVAN) 1 MG tablet TAKE ONE TABLET BY MOUTH EVERY 6 HOURS AS  NEEDED FOR NAUSEA AND VOMITING 30 tablet 0  . ondansetron (ZOFRAN) 8 MG tablet Take 1 tablet (8 mg total) by mouth 2 (two) times daily as needed. Start on the third day after chemotherapy. 30 tablet 1  . oxyCODONE-acetaminophen (ROXICET) 5-325 MG per tablet Take 1-2 tablets by mouth every 4 (four) hours as needed. 50 tablet 0   . prochlorperazine (COMPAZINE) 10 MG tablet Take 1 tablet (10 mg total) by mouth every 6 (six) hours as needed (Nausea or vomiting). 30 tablet 1  . sertraline (ZOLOFT) 50 MG tablet     . Vitamin D, Ergocalciferol, (DRISDOL) 50000 UNITS CAPS capsule Take 1 capsule (50,000 Units total) by mouth every 7 (seven) days. 12 capsule 3   No current facility-administered medications for this visit.   Facility-Administered Medications Ordered in Other Visits  Medication Dose Route Frequency Provider Last Rate Last Dose  . sodium chloride 0.9 % injection 10 mL  10 mL Intracatheter PRN [name], MD   10 mL at 03/04/15 1638  . sodium chloride 0.9 % injection 10 mL  10 mL Intracatheter PRN [name], MD        PHYSICAL EXAMINATION: ECOG PERFORMANCE STATUS: 1 - Symptomatic but completely ambulatory  Filed Vitals:   03/25/15 1415  BP: 155/96  Pulse: 73  Temp: 98.9 F (37.2 C)  Resp: 18   Filed Weights   03/25/15 1415  Weight: 172 lb (78.019 kg)    GENERAL:alert, no distress and comfortable SKIN: skin color, texture, turgor are normal, no rashes or significant lesions EYES: normal, Conjunctiva are pink and non-injected, sclera clear OROPHARYNX:no exudate, no erythema and lips, buccal mucosa, and tongue normal  NECK: supple, thyroid normal size, non-tender, without nodularity LYMPH:  no palpable lymphadenopathy in the cervical, axillary or inguinal LUNGS: clear to auscultation and percussion with normal breathing effort HEART: regular rate & rhythm and no murmurs and no lower extremity edema ABDOMEN:abdomen soft, non-tender and normal bowel sounds Musculoskeletal:no cyanosis of digits and no clubbing  NEURO: alert & oriented x 3 with fluent speech, neuropathy grade 1   LABORATORY DATA:  I have reviewed the data as listed   Chemistry      Component Value Date/Time   NA 140 03/25/2015 1349   K 3.7 03/25/2015 1349   CO2 25 03/25/2015 1349   BUN 7.2 03/25/2015 1349   CREATININE  0.7 03/25/2015 1349      Component Value Date/Time   CALCIUM 9.2 03/25/2015 1349   ALKPHOS 72 03/25/2015 1349   AST 27 03/25/2015 1349   ALT 37 03/25/2015 1349   BILITOT 0.40 03/25/2015 1349       Lab Results  Component Value Date   WBC 3.7* 03/25/2015   HGB 10.5* 03/25/2015   HCT 31.3* 03/25/2015   MCV 91.6 03/25/2015   PLT 208 03/25/2015   NEUTROABS 2.4 03/25/2015   ASSESSMENT & PLAN:  Breast cancer of lower-outer quadrant of right female breast Right breast biopsy 12/03/2014 8:00: Invasive ductal carcinoma, grade 3, ER 0%, PR 0%, 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 Treatment plan: 1. Neo-adjuvant chemotherapy with dose dense Adriamycin and Cytoxan 4 followed by Abraxane once a week 12 ( we wanted to give her carboplatin and Abraxane but insurance did not authorize the treatment) 2. Followed by repeat breast MRI followed by surgery 3. Followed by radiation --------------------------------------------------------------------------------------------------------------------------------------------------  Current treatment: Completed 4 cycles of AC; Today is cycle 6 Abraxane (Carboplatin was given with cycle 1 but insurance did not authorize it , so we discontinued it further)   Chemotherapy toxicities: 1. Constipation: resolved 2. Fatigue related to chemotherapy day 3-5 3. Insomnia: Continue Ativan 2 tablets at bedtime. Renewed 1 mg daily 4. Anxiety:  5. Nausea and vomiting 1 6. Mouth sores: Much improved  7. Neuropathy after cycle 4 of dose dense Adriamycin and Cytoxan, neuropathy grade 1 with Abraxane cycle 6, decreasing the dose of Abraxane to 65 mg/m  8. Leukopenia: Slowly decreasing with each treatment. Being monitored Monitoring closely for toxicities. Return to clinic in 2 weeks for cycle 8/12 Abraxane    No orders of the defined types  were placed in this encounter.   The patient has a good understanding of the overall plan. she agrees with it. she will call with any problems that may develop before the next visit here.   Rulon Eisenmenger, MD 03/25/2015

## 2015-03-25 NOTE — Patient Instructions (Signed)
[facility] Discharge Instructions for Patients Receiving Chemotherapy  Today you received the following chemotherapy agents: Abraxane.  To help prevent nausea and vomiting after your treatment, we encourage you to take your nausea medication: Zofran. Take one every 8 hours as needed.   If you develop nausea and vomiting that is not controlled by your nausea medication, call the clinic.   BELOW ARE SYMPTOMS THAT SHOULD BE REPORTED IMMEDIATELY:  *FEVER GREATER THAN 100.5 F  *CHILLS WITH OR WITHOUT FEVER  NAUSEA AND VOMITING THAT IS NOT CONTROLLED WITH YOUR NAUSEA MEDICATION  *UNUSUAL SHORTNESS OF BREATH  *UNUSUAL BRUISING OR BLEEDING  TENDERNESS IN MOUTH AND THROAT WITH OR WITHOUT PRESENCE OF ULCERS  *URINARY PROBLEMS  *BOWEL PROBLEMS  UNUSUAL RASH Items with * indicate a potential emergency and should be followed up as soon as possible.  Feel free to call the clinic should you have any questions or concerns. The clinic phone number is [phone].  Please show the CHEMO ALERT CARD at check-in to the Emergency Department and triage nurse.   

## 2015-03-25 NOTE — Telephone Encounter (Signed)
Appointments made and avs printed °

## 2015-04-01 ENCOUNTER — Ambulatory Visit: Payer: BLUE CROSS/BLUE SHIELD

## 2015-04-01 ENCOUNTER — Ambulatory Visit (HOSPITAL_BASED_OUTPATIENT_CLINIC_OR_DEPARTMENT_OTHER): Payer: BLUE CROSS/BLUE SHIELD | Admitting: Hematology and Oncology

## 2015-04-01 ENCOUNTER — Ambulatory Visit (HOSPITAL_BASED_OUTPATIENT_CLINIC_OR_DEPARTMENT_OTHER): Payer: BLUE CROSS/BLUE SHIELD

## 2015-04-01 ENCOUNTER — Other Ambulatory Visit (HOSPITAL_BASED_OUTPATIENT_CLINIC_OR_DEPARTMENT_OTHER): Payer: BLUE CROSS/BLUE SHIELD

## 2015-04-01 ENCOUNTER — Encounter: Payer: Self-pay | Admitting: Hematology and Oncology

## 2015-04-01 VITALS — BP 134/92 | HR 90 | Temp 97.8°F | Resp 18 | Ht 63.0 in | Wt 177.4 lb

## 2015-04-01 DIAGNOSIS — C50511 Malignant neoplasm of lower-outer quadrant of right female breast: Secondary | ICD-10-CM | POA: Diagnosis not present

## 2015-04-01 DIAGNOSIS — Z5111 Encounter for antineoplastic chemotherapy: Secondary | ICD-10-CM | POA: Diagnosis not present

## 2015-04-01 DIAGNOSIS — G620 Drug-induced polyneuropathy: Secondary | ICD-10-CM | POA: Diagnosis not present

## 2015-04-01 DIAGNOSIS — D72818 Other decreased white blood cell count: Secondary | ICD-10-CM

## 2015-04-01 DIAGNOSIS — Z171 Estrogen receptor negative status [ER-]: Secondary | ICD-10-CM | POA: Diagnosis not present

## 2015-04-01 LAB — CBC WITH DIFFERENTIAL/PLATELET
BASO%: 0.7 % (ref 0.0–2.0)
Basophils Absolute: 0 10*3/uL (ref 0.0–0.1)
EOS%: 2.1 % (ref 0.0–7.0)
Eosinophils Absolute: 0.1 10*3/uL (ref 0.0–0.5)
HCT: 31 % — ABNORMAL LOW (ref 34.8–46.6)
HEMOGLOBIN: 10.2 g/dL — AB (ref 11.6–15.9)
LYMPH%: 19.2 % (ref 14.0–49.7)
MCH: 30.9 pg (ref 25.1–34.0)
MCHC: 33 g/dL (ref 31.5–36.0)
MCV: 93.6 fL (ref 79.5–101.0)
MONO#: 0.3 10*3/uL (ref 0.1–0.9)
MONO%: 9.6 % (ref 0.0–14.0)
NEUT%: 68.4 % (ref 38.4–76.8)
NEUTROS ABS: 2.3 10*3/uL (ref 1.5–6.5)
Platelets: 223 10*3/uL (ref 145–400)
RBC: 3.31 10*6/uL — ABNORMAL LOW (ref 3.70–5.45)
RDW: 16.7 % — AB (ref 11.2–14.5)
WBC: 3.4 10*3/uL — AB (ref 3.9–10.3)
lymph#: 0.7 10*3/uL — ABNORMAL LOW (ref 0.9–3.3)

## 2015-04-01 LAB — COMPREHENSIVE METABOLIC PANEL (CC13)
ALBUMIN: 3.5 g/dL (ref 3.5–5.0)
ALK PHOS: 71 U/L (ref 40–150)
ALT: 37 U/L (ref 0–55)
AST: 26 U/L (ref 5–34)
Anion Gap: 6 mEq/L (ref 3–11)
BUN: 12.3 mg/dL (ref 7.0–26.0)
CO2: 25 mEq/L (ref 22–29)
Calcium: 9.2 mg/dL (ref 8.4–10.4)
Chloride: 109 mEq/L (ref 98–109)
Creatinine: 0.7 mg/dL (ref 0.6–1.1)
EGFR: >90 mL/min/{1.73_m2} (ref >90)
GLUCOSE: 98 mg/dL (ref 70–140)
POTASSIUM: 3.7 meq/L (ref 3.5–5.1)
SODIUM: 140 meq/L (ref 136–145)
TOTAL PROTEIN: 6.6 g/dL (ref 6.4–8.3)
Total Bilirubin: 0.46 mg/dL (ref 0.20–1.20)

## 2015-04-01 MED ORDER — SODIUM CHLORIDE 0.9 % IV SOLN
Freq: Once | INTRAVENOUS | Status: AC
  Administered 2015-04-01: 16:00:00 via INTRAVENOUS
  Filled 2015-04-01: qty 4

## 2015-04-01 MED ORDER — HEPARIN SOD (PORK) LOCK FLUSH 100 UNIT/ML IV SOLN
500.0000 [IU] | Freq: Once | INTRAVENOUS | Status: AC | PRN
  Administered 2015-04-01: 500 [IU]
  Filled 2015-04-01: qty 5

## 2015-04-01 MED ORDER — SODIUM CHLORIDE 0.9 % IV SOLN
Freq: Once | INTRAVENOUS | Status: AC
  Administered 2015-04-01: 16:00:00 via INTRAVENOUS

## 2015-04-01 MED ORDER — SODIUM CHLORIDE 0.9 % IJ SOLN
10.0000 mL | INTRAMUSCULAR | Status: DC | PRN
  Administered 2015-04-01: 10 mL
  Filled 2015-04-01: qty 10

## 2015-04-01 MED ORDER — PACLITAXEL PROTEIN-BOUND CHEMO INJECTION 100 MG
65.0000 mg/m2 | Freq: Once | INTRAVENOUS | Status: AC
  Administered 2015-04-01: 125 mg via INTRAVENOUS
  Filled 2015-04-01: qty 25

## 2015-04-01 NOTE — Patient Instructions (Signed)
[facility] Discharge Instructions for Patients Receiving Chemotherapy  Today you received the following chemotherapy agents abraxane  To help prevent nausea and vomiting after your treatment, we encourage you to take your nausea medication    If you develop nausea and vomiting that is not controlled by your nausea medication, call the clinic.   BELOW ARE SYMPTOMS THAT SHOULD BE REPORTED IMMEDIATELY:  *FEVER GREATER THAN 100.5 F  *CHILLS WITH OR WITHOUT FEVER  NAUSEA AND VOMITING THAT IS NOT CONTROLLED WITH YOUR NAUSEA MEDICATION  *UNUSUAL SHORTNESS OF BREATH  *UNUSUAL BRUISING OR BLEEDING  TENDERNESS IN MOUTH AND THROAT WITH OR WITHOUT PRESENCE OF ULCERS  *URINARY PROBLEMS  *BOWEL PROBLEMS  UNUSUAL RASH Items with * indicate a potential emergency and should be followed up as soon as possible.  Feel free to call the clinic you have any questions or concerns. The clinic phone number is [phone].  Please show the CHEMO ALERT CARD at check-in to the Emergency Department and triage nurse.   

## 2015-04-01 NOTE — Progress Notes (Signed)
Patient Care Team: Secundino Ginger, [name], [name], [name], [name], [name], [name], NP as Nurse Practitioner (Nurse Practitioner)  DIAGNOSIS: Breast cancer of lower-outer quadrant of right female breast [facility])   Staging form: Breast, AJCC 7th Edition     Clinical stage from 12/11/2014: Stage IIA (T2, N0, M0) - Unsigned       Staging comments: Staged at breast conference on 7.20.16    SUMMARY OF ONCOLOGIC HISTORY:   Breast cancer of lower-outer quadrant of right female breast ([place])   12/03/2014 Mammogram Right breast mass 1.9 cm it o'clock position 8 cm depth from the nipple   12/03/2014 Initial Diagnosis Right breast biopsy 8:00: Invasive ductal carcinoma, grade 3, ER 0%, PR 0%, 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 She came in today complaining of slightly worsening neuropathy of the tips of the fingers. She does not have any neuropathy in the toes. It is not associated with any pain. It appears that weeks with follow-up has been helping her. Her symptoms are mostly 2-3 days after chemotherapy and they get worse before they get better. She is very concerned about permanent neuropathy damage.  REVIEW OF SYSTEMS:     Constitutional: Denies fevers, chills or abnormal weight loss Eyes: Denies blurriness of vision Ears, nose, mouth, throat, and face: Denies mucositis or sore throat Respiratory: Denies cough, dyspnea or wheezes Cardiovascular: Denies palpitation, chest discomfort or lower extremity swelling Gastrointestinal:  Denies nausea, heartburn or change in bowel habits Skin: Denies abnormal skin rashes Lymphatics: Denies new lymphadenopathy or easy bruising Neurological:tingling and numbness of the tips of the fingers Behavioral/Psych: Mood is stable, no new changes  All other systems were reviewed with the patient and are negative.  I have reviewed the past medical history, past surgical history, social history and family history with the patient and they are unchanged from previous note.  ALLERGIES:  has No Known Allergies.  MEDICATIONS:  Current Outpatient Prescriptions  Medication Sig Dispense Refill  . ALPRAZolam (XANAX) 1 MG tablet Take 2 tablets (2 mg total) by mouth at bedtime. 60 tablet 1  . cephALEXin (KEFLEX) 500 MG capsule Take 1 capsule (500 mg total) by mouth 4 (four) times daily. 20 capsule 0  . dexamethasone (DECADRON) 4 MG tablet Take 2 tablets by mouth once a day on the day after chemotherapy and then take 2 tablets two times a day for 2 days. 30 tablet 1  . lactulose (CHRONULAC) 10 GM/15ML solution Take 15 mLs (10 g total) by mouth 3 (three) times daily. 240 mL 0  . lidocaine-prilocaine (EMLA) cream Apply to affected area once 30 g 3  . lisinopril-hydrochlorothiazide (PRINZIDE,ZESTORETIC) 20-12.5 MG tablet Take 1 tablet by mouth 2 (two) times daily. 60 tablet 6  . LORazepam (ATIVAN) 1 MG tablet TAKE ONE TABLET  BY MOUTH EVERY 6 HOURS AS NEEDED FOR NAUSEA AND VOMITING 30 tablet 0  . ondansetron (ZOFRAN) 8 MG tablet Take 1 tablet (8 mg total) by mouth 2 (two) times daily as needed. Start on the third day after chemotherapy. 30 tablet 1  . oxyCODONE-acetaminophen (ROXICET) 5-325 MG  per tablet Take 1-2 tablets by mouth every 4 (four) hours as needed. 50 tablet 0  . prochlorperazine (COMPAZINE) 10 MG tablet Take 1 tablet (10 mg total) by mouth every 6 (six) hours as needed (Nausea or vomiting). 30 tablet 1  . sertraline (ZOLOFT) 50 MG tablet     . Vitamin D, Ergocalciferol, (DRISDOL) 50000 UNITS CAPS capsule Take 1 capsule (50,000 Units total) by mouth every 7 (seven) days. 12 capsule 3   No current facility-administered medications for this visit.   Facility-Administered Medications Ordered in Other Visits  Medication Dose Route Frequency Provider Last Rate Last Dose  . sodium chloride 0.9 % injection 10 mL  10 mL Intracatheter PRN Vinay Gudena, MD   10 mL at 03/04/15 1638    PHYSICAL EXAMINATION: ECOG PERFORMANCE STATUS: 1 - Symptomatic but completely ambulatory  Filed Vitals:   04/01/15 1456  BP: 134/92  Pulse: 90  Temp: 97.8 F (36.6 C)  Resp: 18   Filed Weights   04/01/15 1456  Weight: 177 lb 6.4 oz (80.468 kg)    GENERAL:alert, no distress and comfortable SKIN: skin color, texture, turgor are normal, no rashes or significant lesions EYES: normal, Conjunctiva are pink and non-injected, sclera clear OROPHARYNX:no exudate, no erythema and lips, buccal mucosa, and tongue normal  NECK: supple, thyroid normal size, non-tender, without nodularity LYMPH:  no palpable lymphadenopathy in the cervical, axillary or inguinal LUNGS: clear to auscultation and percussion with normal breathing effort HEART: regular rate & rhythm and no murmurs and no lower extremity edema ABDOMEN:abdomen soft, non-tender and normal bowel sounds Musculoskeletal:no cyanosis of digits and no clubbing  NEURO: alert & oriented x 3 with fluent speech, neuropathy grade 1-2  LABORATORY DATA:  I have reviewed the data as listed   Chemistry      Component Value Date/Time   NA 140 03/25/2015 1349   K 3.7 03/25/2015 1349   CO2 25 03/25/2015 1349   BUN 7.2 03/25/2015 1349   CREATININE  0.7 03/25/2015 1349      Component Value Date/Time   CALCIUM 9.2 03/25/2015 1349   ALKPHOS 72 03/25/2015 1349   AST 27 03/25/2015 1349   ALT 37 03/25/2015 1349   BILITOT 0.40 03/25/2015 1349       Lab Results  Component Value Date   WBC 3.4* 04/01/2015   HGB 10.2* 04/01/2015   HCT 31.0* 04/01/2015   MCV 93.6 04/01/2015   PLT 223 04/01/2015   NEUTROABS 2.3 04/01/2015   ASSESSMENT & PLAN:  Breast cancer of lower-outer quadrant of right female breast Right breast biopsy 12/03/2014 8:00: Invasive ductal carcinoma, grade 3, ER 0%, PR 0%, 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 Treatment plan: 1. Neo-adjuvant chemotherapy with dose dense Adriamycin and Cytoxan 4 followed by Abraxane once a week 12 ( we wanted to give her carboplatin and Abraxane but insurance did not authorize the treatment) 2. Followed by repeat breast MRI followed by surgery 3. Followed by radiation -------------------------------------------------------------------------------------------------------------------------------------------------- Current treatment: Completed 4 cycles of AC; Today is cycle 7 Abraxane (Carboplatin was   given with cycle 1 but insurance did not authorize it , so we discontinued it further)   Chemotherapy toxicities: 1. Constipation: resolved 2. Fatigue related to chemotherapy day 3-5 3. Insomnia: Continue Ativan 2 tablets at bedtime. Renewed 1 mg daily 4. Anxiety:  5. Nausea and vomiting 1 6. 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  8. Leukopenia: Slowly decreasing with each treatment. Being monitored  Monitoring closely for toxicities. Return to clinic in 1 week for cycle  8/12 Abraxane   No orders of the defined types were placed in this encounter.   The patient has a good understanding of the overall plan. she agrees with it. she will call with any problems that may develop before the next visit here.   Gudena, Vinay K, MD 04/01/2015      

## 2015-04-01 NOTE — Assessment & Plan Note (Signed)
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 Treatment plan: 1. Neo-adjuvant chemotherapy with dose dense Adriamycin and Cytoxan 4 followed by Abraxane once a week 12 ( we wanted to give her carboplatin and Abraxane but insurance did not authorize the treatment) 2. Followed by repeat breast MRI followed by surgery 3. Followed by radiation -------------------------------------------------------------------------------------------------------------------------------------------------- Current treatment: Completed 4 cycles of AC; Today is cycle 7 Abraxane (Carboplatin was given with cycle 1 but insurance did not authorize it , so we discontinued it further)   Chemotherapy toxicities: 1. Constipation: resolved 2. Fatigue related to chemotherapy day 3-5 3. Insomnia: Continue Ativan 2 tablets at bedtime. Renewed 1 mg daily 4. Anxiety:  5. Nausea and vomiting 1 6. Mouth sores: Much improved  7. Neuropathy neuropathy grade 2 with Abraxane cycle 6, decreased the dose of Abraxane to 65 mg/m  8. Leukopenia: Slowly decreasing with each treatment. Being monitored Monitoring closely for toxicities. Return to clinic in 2 weeks for cycle 9/12 Abraxane

## 2015-04-08 ENCOUNTER — Encounter: Payer: Self-pay | Admitting: *Deleted

## 2015-04-08 ENCOUNTER — Ambulatory Visit (HOSPITAL_BASED_OUTPATIENT_CLINIC_OR_DEPARTMENT_OTHER): Payer: BLUE CROSS/BLUE SHIELD | Admitting: Hematology and Oncology

## 2015-04-08 ENCOUNTER — Ambulatory Visit (HOSPITAL_BASED_OUTPATIENT_CLINIC_OR_DEPARTMENT_OTHER): Payer: BLUE CROSS/BLUE SHIELD

## 2015-04-08 ENCOUNTER — Ambulatory Visit: Payer: BLUE CROSS/BLUE SHIELD

## 2015-04-08 ENCOUNTER — Other Ambulatory Visit: Payer: BLUE CROSS/BLUE SHIELD

## 2015-04-08 ENCOUNTER — Other Ambulatory Visit (HOSPITAL_BASED_OUTPATIENT_CLINIC_OR_DEPARTMENT_OTHER): Payer: BLUE CROSS/BLUE SHIELD

## 2015-04-08 ENCOUNTER — Telehealth: Payer: Self-pay | Admitting: Hematology and Oncology

## 2015-04-08 ENCOUNTER — Encounter: Payer: Self-pay | Admitting: Hematology and Oncology

## 2015-04-08 VITALS — BP 133/89 | HR 80 | Temp 98.5°F | Resp 18 | Ht 63.0 in | Wt 175.4 lb

## 2015-04-08 DIAGNOSIS — G620 Drug-induced polyneuropathy: Secondary | ICD-10-CM

## 2015-04-08 DIAGNOSIS — D72819 Decreased white blood cell count, unspecified: Secondary | ICD-10-CM

## 2015-04-08 DIAGNOSIS — Z5111 Encounter for antineoplastic chemotherapy: Secondary | ICD-10-CM

## 2015-04-08 DIAGNOSIS — Z171 Estrogen receptor negative status [ER-]: Secondary | ICD-10-CM

## 2015-04-08 DIAGNOSIS — C50511 Malignant neoplasm of lower-outer quadrant of right female breast: Secondary | ICD-10-CM | POA: Diagnosis not present

## 2015-04-08 LAB — CBC WITH DIFFERENTIAL/PLATELET
BASO%: 0.5 % (ref 0.0–2.0)
Basophils Absolute: 0 10*3/uL (ref 0.0–0.1)
EOS%: 0.8 % (ref 0.0–7.0)
Eosinophils Absolute: 0 10*3/uL (ref 0.0–0.5)
HCT: 31 % — ABNORMAL LOW (ref 34.8–46.6)
HGB: 10.4 g/dL — ABNORMAL LOW (ref 11.6–15.9)
LYMPH%: 17.9 % (ref 14.0–49.7)
MCH: 31.7 pg (ref 25.1–34.0)
MCHC: 33.7 g/dL (ref 31.5–36.0)
MCV: 94.2 fL (ref 79.5–101.0)
MONO#: 0.4 10*3/uL (ref 0.1–0.9)
MONO%: 9.2 % (ref 0.0–14.0)
NEUT%: 71.6 % (ref 38.4–76.8)
NEUTROS ABS: 3.1 10*3/uL (ref 1.5–6.5)
Platelets: 234 10*3/uL (ref 145–400)
RBC: 3.29 10*6/uL — AB (ref 3.70–5.45)
RDW: 16.3 % — ABNORMAL HIGH (ref 11.2–14.5)
WBC: 4.3 10*3/uL (ref 3.9–10.3)
lymph#: 0.8 10*3/uL — ABNORMAL LOW (ref 0.9–3.3)

## 2015-04-08 LAB — COMPREHENSIVE METABOLIC PANEL (CC13)
ALBUMIN: 3.4 g/dL — AB (ref 3.5–5.0)
ALK PHOS: 67 U/L (ref 40–150)
ALT: 21 U/L (ref 0–55)
AST: 19 U/L (ref 5–34)
Anion Gap: 6 mEq/L (ref 3–11)
BILIRUBIN TOTAL: 0.52 mg/dL (ref 0.20–1.20)
BUN: 11 mg/dL (ref 7.0–26.0)
CALCIUM: 9.1 mg/dL (ref 8.4–10.4)
CO2: 24 mEq/L (ref 22–29)
Chloride: 111 mEq/L — ABNORMAL HIGH (ref 98–109)
Creatinine: 0.7 mg/dL (ref 0.6–1.1)
Glucose: 81 mg/dl (ref 70–140)
POTASSIUM: 4.6 meq/L (ref 3.5–5.1)
Sodium: 140 mEq/L (ref 136–145)
Total Protein: 6.4 g/dL (ref 6.4–8.3)

## 2015-04-08 MED ORDER — SERTRALINE HCL 50 MG PO TABS: 50.0000 mg | ORAL_TABLET | Freq: Every day | ORAL | Status: DC

## 2015-04-08 MED ORDER — SODIUM CHLORIDE 0.9 % IJ SOLN
10.0000 mL | INTRAMUSCULAR | Status: DC | PRN
  Administered 2015-04-08: 10 mL
  Filled 2015-04-08: qty 10

## 2015-04-08 MED ORDER — SODIUM CHLORIDE 0.9 % IV SOLN
Freq: Once | INTRAVENOUS | Status: AC
  Administered 2015-04-08: 10:00:00 via INTRAVENOUS

## 2015-04-08 MED ORDER — HEPARIN SOD (PORK) LOCK FLUSH 100 UNIT/ML IV SOLN
500.0000 [IU] | Freq: Once | INTRAVENOUS | Status: AC | PRN
  Administered 2015-04-08: 500 [IU]
  Filled 2015-04-08: qty 5

## 2015-04-08 MED ORDER — SODIUM CHLORIDE 0.9 % IV SOLN
Freq: Once | INTRAVENOUS | Status: AC
  Administered 2015-04-08: 10:00:00 via INTRAVENOUS
  Filled 2015-04-08: qty 4

## 2015-04-08 MED ORDER — PACLITAXEL PROTEIN-BOUND CHEMO INJECTION 100 MG
55.0000 mg/m2 | Freq: Once | INTRAVENOUS | Status: AC
  Administered 2015-04-08: 100 mg via INTRAVENOUS
  Filled 2015-04-08: qty 20

## 2015-04-08 NOTE — Addendum Note (Signed)
Addended by: Prentiss Bells on: 04/08/2015 10:25 AM   Modules accepted: Medications

## 2015-04-08 NOTE — Assessment & Plan Note (Signed)
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 Treatment plan: 1. Neo-adjuvant chemotherapy with dose dense Adriamycin and Cytoxan 4 followed by Abraxane once a week 12 ( we wanted to give her carboplatin and Abraxane but insurance did not authorize the treatment) 2. Followed by repeat breast MRI followed by surgery 3. Followed by radiation -------------------------------------------------------------------------------------------------------------------------------------------------- Current treatment: Completed 4 cycles of AC; Today is cycle 8 Abraxane (Carboplatin was given with cycle 1 but insurance did not authorize it , so we discontinued it further)   Chemotherapy toxicities: 1. Constipation: resolved 2. Fatigue related to chemotherapy day 3-5 3. Insomnia: Continue Ativan 2 tablets at bedtime. Renewed 1 mg daily 4. Anxiety:  5. Nausea and vomiting 1 6. 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  8. Leukopenia: Slowly decreasing with each treatment. Being monitored  Monitoring closely for toxicities. Return to clinic in 1 week for cycle 9/12 Abraxane

## 2015-04-08 NOTE — Telephone Encounter (Signed)
Appointments made and avs pritned for patient,ok  To use new patient spot per dr Loleta Dicker

## 2015-04-08 NOTE — Addendum Note (Signed)
Addended by: Prentiss Bells on: 04/08/2015 11:23 AM   Modules accepted: Orders

## 2015-04-08 NOTE — Progress Notes (Signed)
Patient Care Team: Kaitlyn Ginger, PA-C as PCP - General (Cardiology) Kaitlyn Messing III, MD as Consulting Physician (General Surgery) Kaitlyn Lose, MD as Consulting Physician (Hematology and Oncology) Kaitlyn Pray, MD as Consulting Physician (Radiation Oncology) Kaitlyn Kaufmann, RN as Registered Nurse Kaitlyn Germany, RN as Registered Nurse Kaitlyn Bouche, NP as Nurse Practitioner (Nurse Practitioner)  DIAGNOSIS: Breast cancer of lower-outer quadrant of right female breast [name]   Staging form: Breast, AJCC 7th Edition     Clinical stage from 12/11/2014: Stage IIA (T2, N0, M0) - Unsigned       Staging comments: Staged at breast conference on 7.20.16    SUMMARY OF ONCOLOGIC HISTORY:   Breast cancer of lower-outer quadrant of right female breast ([place])   12/03/2014 Mammogram Right breast mass 1.9 cm it o'clock position 8 cm depth from the nipple   12/03/2014 Initial Diagnosis Right breast biopsy 8:00: Invasive ductal carcinoma, grade 3, ER 0%, PR 0%, 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 She is here to receive cycle 8 of Abraxane. She is getting emotionally fatigued as well as physical fatigue. She has not been able to work a full day couple of days after each chemotherapy. All of this is draining her emotionally. Neuropathy is also slightly worse with this still at the tips of the fingers and toes.   REVIEW OF SYSTEMS:   Constitutional: Denies fevers, chills or abnormal weight  loss Eyes: Denies blurriness of vision Ears, nose, mouth, throat, and face: Denies mucositis or sore throat Respiratory: Denies cough, dyspnea or wheezes Cardiovascular: Denies palpitation, chest discomfort or lower extremity swelling Gastrointestinal:  Denies nausea, heartburn or change in bowel habits Skin: Denies abnormal skin rashes Lymphatics: Denies new lymphadenopathy or easy bruising Neurological:grade 1-2 peripheral neuropathy in the tips of the fingers and toes which is now constant in nature. Behavioral/Psych: Mood is stable, no new changes   All other systems were reviewed with the patient and are negative.  I have reviewed the past medical history, past surgical history, social history and family history with the patient and they are unchanged from previous note.  ALLERGIES:  has No Known Allergies.  MEDICATIONS:  Current Outpatient Prescriptions  Medication Sig Dispense Refill  . ALPRAZolam (XANAX) 1 MG tablet Take 2 tablets (2 mg total) by mouth at bedtime. 60 tablet 1  . cephALEXin (KEFLEX) 500 MG capsule Take 1 capsule (500 mg total) by mouth 4 (four) times daily. 20 capsule 0  . dexamethasone (DECADRON) 4 MG tablet Take 2 tablets by mouth once a day on the day after chemotherapy and then take 2 tablets two times a day for 2 days. 30 tablet 1  . lactulose (CHRONULAC) 10 GM/15ML solution Take 15 mLs (10 g total) by mouth 3 (three) times daily. 240 mL 0  . lidocaine-prilocaine (EMLA) cream Apply to affected area once 30 g 3  . lisinopril-hydrochlorothiazide (PRINZIDE,ZESTORETIC) 20-12.5 MG tablet Take 1 tablet by mouth 2 (two) times daily. 60 tablet 6  . LORazepam (ATIVAN) 1  MG tablet TAKE ONE TABLET BY MOUTH EVERY 6 HOURS AS NEEDED FOR NAUSEA AND VOMITING 30 tablet 0  . ondansetron (ZOFRAN) 8 MG tablet Take 1 tablet (8 mg total) by mouth 2 (two) times daily as needed. Start on the third day after chemotherapy. 30 tablet 1  . oxyCODONE-acetaminophen (ROXICET) 5-325 MG per  tablet Take 1-2 tablets by mouth every 4 (four) hours as needed. 50 tablet 0  . prochlorperazine (COMPAZINE) 10 MG tablet Take 1 tablet (10 mg total) by mouth every 6 (six) hours as needed (Nausea or vomiting). 30 tablet 1  . sertraline (ZOLOFT) 50 MG tablet Take 1 tablet (50 mg total) by mouth daily. 30 tablet 6  . Vitamin D, Ergocalciferol, (DRISDOL) 50000 UNITS CAPS capsule Take 1 capsule (50,000 Units total) by mouth every 7 (seven) days. 12 capsule 3   No current facility-administered medications for this visit.   Facility-Administered Medications Ordered in Other Visits  Medication Dose Route Frequency Provider Last Rate Last Dose  . sodium chloride 0.9 % injection 10 mL  10 mL Intracatheter PRN Kaitlyn Lose, MD   10 mL at 03/04/15 1638    PHYSICAL EXAMINATION: ECOG PERFORMANCE STATUS: 1 - Symptomatic but completely ambulatory  Filed Vitals:   04/08/15 0916  BP: 133/89  Pulse: 80  Temp: 98.5 F (36.9 C)  Resp: 18   Filed Weights   04/08/15 0916  Weight: 175 lb 6.4 oz (79.561 kg)    GENERAL:alert, no distress and comfortable SKIN: skin color, texture, turgor are normal, no rashes or significant lesions EYES: normal, Conjunctiva are pink and non-injected, sclera clear OROPHARYNX:no exudate, no erythema and lips, buccal mucosa, and tongue normal  NECK: supple, thyroid normal size, non-tender, without nodularity LYMPH:  no palpable lymphadenopathy in the cervical, axillary or inguinal LUNGS: clear to auscultation and percussion with normal breathing effort HEART: regular rate & rhythm and no murmurs and no lower extremity edema ABDOMEN:abdomen soft, non-tender and normal bowel sounds Musculoskeletal:no cyanosis of digits and no clubbing  NEURO: alert & oriented x 3 with fluent speech, grade 1-2 peripheral neuropathy  LABORATORY DATA:  I have reviewed the data as listed   Chemistry      Component Value Date/Time   NA 140 04/01/2015 1444   K 3.7 04/01/2015 1444   CO2 25  04/01/2015 1444   BUN 12.3 04/01/2015 1444   CREATININE 0.7 04/01/2015 1444      Component Value Date/Time   CALCIUM 9.2 04/01/2015 1444   ALKPHOS 71 04/01/2015 1444   AST 26 04/01/2015 1444   ALT 37 04/01/2015 1444   BILITOT 0.46 04/01/2015 1444       Lab Results  Component Value Date   WBC 4.3 04/08/2015   HGB 10.4* 04/08/2015   HCT 31.0* 04/08/2015   MCV 94.2 04/08/2015   PLT 234 04/08/2015   NEUTROABS 3.1 04/08/2015   ASSESSMENT & PLAN:  Breast cancer of lower-outer quadrant of right female breast Right breast biopsy 12/03/2014 8:00: Invasive ductal carcinoma, grade 3, ER 0%, PR 0%, 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 Treatment plan: 1. Neo-adjuvant chemotherapy with dose dense Adriamycin and Cytoxan 4 followed by Abraxane once a week 12 ( we wanted to give her carboplatin and Abraxane but insurance did not authorize the treatment) 2. Followed by repeat breast MRI followed by surgery 3. Followed by radiation --------------------------------------------------------------------------------------------------------------------------------------------------  Current treatment: Completed 4 cycles of AC; Today is cycle 8 Abraxane (Carboplatin was given with cycle 1 but insurance did not authorize it , so we discontinued it further)   Chemotherapy toxicities: 1. Constipation: resolved 2. Fatigue related to chemotherapy day 3-5 3. Insomnia: Continue Ativan 2 tablets at bedtime. Renewed 1 mg daily 4. Anxiety:  5. Nausea and vomiting 1 6. 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  8. Leukopenia: Slowly decreasing with each treatment. With the reduced dosage of Abraxane, leukopenia is  improved  Monitoring closely for toxicities. Return to clinic in 1 week for cycle 9/12 Abraxane   No orders of the defined types were placed in this encounter.   The patient has a good understanding of the overall plan. she agrees with it. she will call with any problems that may develop before the next visit here.   Rulon Eisenmenger, MD 04/08/2015

## 2015-04-08 NOTE — Patient Instructions (Signed)
[facility] Discharge Instructions for Patients Receiving Chemotherapy  Today you received the following chemotherapy agents abraxane  To help prevent nausea and vomiting after your treatment, we encourage you to take your nausea medication    If you develop nausea and vomiting that is not controlled by your nausea medication, call the clinic.   BELOW ARE SYMPTOMS THAT SHOULD BE REPORTED IMMEDIATELY:  *FEVER GREATER THAN 100.5 F  *CHILLS WITH OR WITHOUT FEVER  NAUSEA AND VOMITING THAT IS NOT CONTROLLED WITH YOUR NAUSEA MEDICATION  *UNUSUAL SHORTNESS OF BREATH  *UNUSUAL BRUISING OR BLEEDING  TENDERNESS IN MOUTH AND THROAT WITH OR WITHOUT PRESENCE OF ULCERS  *URINARY PROBLEMS  *BOWEL PROBLEMS  UNUSUAL RASH Items with * indicate a potential emergency and should be followed up as soon as possible.  Feel free to call the clinic you have any questions or concerns. The clinic phone number is [phone].  Please show the CHEMO ALERT CARD at check-in to the Emergency Department and triage nurse.   

## 2015-04-14 NOTE — Assessment & Plan Note (Signed)
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 Treatment plan: 1. Neo-adjuvant chemotherapy with dose dense Adriamycin and Cytoxan 4 followed by Abraxane once a week 12 ( we wanted to give her carboplatin and Abraxane but insurance did not authorize the treatment) 2. Followed by repeat breast MRI followed by surgery 3. Followed by radiation -------------------------------------------------------------------------------------------------------------------------------------------------- Current treatment: Completed 4 cycles of AC; Today is cycle 9 Abraxane (Carboplatin was given with cycle 1 but insurance did not authorize it , so we discontinued it further)   Chemotherapy toxicities: 1. Constipation: resolved 2. Fatigue related to chemotherapy day 3-5 3. Insomnia: Continue Ativan 2 tablets at bedtime. Renewed 1 mg daily 4. Anxiety:  5. Nausea and vomiting 1 6. 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  8. Leukopenia: Slowly decreasing with each treatment. With the reduced dosage of Abraxane, leukopenia is improved  Monitoring closely for toxicities. Return to clinic in 1 week for cycle 10/12 Abraxane

## 2015-04-15 ENCOUNTER — Encounter: Payer: Self-pay | Admitting: Hematology and Oncology

## 2015-04-15 ENCOUNTER — Other Ambulatory Visit: Payer: BLUE CROSS/BLUE SHIELD

## 2015-04-15 ENCOUNTER — Ambulatory Visit (HOSPITAL_BASED_OUTPATIENT_CLINIC_OR_DEPARTMENT_OTHER): Payer: BLUE CROSS/BLUE SHIELD

## 2015-04-15 ENCOUNTER — Ambulatory Visit: Payer: BLUE CROSS/BLUE SHIELD

## 2015-04-15 ENCOUNTER — Other Ambulatory Visit (HOSPITAL_BASED_OUTPATIENT_CLINIC_OR_DEPARTMENT_OTHER): Payer: BLUE CROSS/BLUE SHIELD

## 2015-04-15 ENCOUNTER — Ambulatory Visit (HOSPITAL_BASED_OUTPATIENT_CLINIC_OR_DEPARTMENT_OTHER): Payer: BLUE CROSS/BLUE SHIELD | Admitting: Hematology and Oncology

## 2015-04-15 VITALS — BP 120/79 | HR 82 | Temp 98.0°F | Resp 18 | Ht 63.0 in | Wt 173.7 lb

## 2015-04-15 DIAGNOSIS — C50511 Malignant neoplasm of lower-outer quadrant of right female breast: Secondary | ICD-10-CM

## 2015-04-15 DIAGNOSIS — Z5111 Encounter for antineoplastic chemotherapy: Secondary | ICD-10-CM

## 2015-04-15 DIAGNOSIS — Z171 Estrogen receptor negative status [ER-]: Secondary | ICD-10-CM | POA: Diagnosis not present

## 2015-04-15 DIAGNOSIS — G620 Drug-induced polyneuropathy: Secondary | ICD-10-CM | POA: Diagnosis not present

## 2015-04-15 LAB — COMPREHENSIVE METABOLIC PANEL (CC13)
ALT: 22 U/L (ref 0–55)
ANION GAP: 9 meq/L (ref 3–11)
AST: 23 U/L (ref 5–34)
Albumin: 3.7 g/dL (ref 3.5–5.0)
Alkaline Phosphatase: 67 U/L (ref 40–150)
BUN: 10.4 mg/dL (ref 7.0–26.0)
CALCIUM: 9.5 mg/dL (ref 8.4–10.4)
CHLORIDE: 105 meq/L (ref 98–109)
CO2: 25 meq/L (ref 22–29)
CREATININE: 0.7 mg/dL (ref 0.6–1.1)
EGFR: >90 mL/min/{1.73_m2} (ref >90)
Glucose: 78 mg/dl (ref 70–140)
POTASSIUM: 3.7 meq/L (ref 3.5–5.1)
Sodium: 139 mEq/L (ref 136–145)
Total Bilirubin: 0.65 mg/dL (ref 0.20–1.20)
Total Protein: 6.8 g/dL (ref 6.4–8.3)

## 2015-04-15 LAB — CBC WITH DIFFERENTIAL/PLATELET
BASO%: 0.7 % (ref 0.0–2.0)
BASOS ABS: 0 10*3/uL (ref 0.0–0.1)
EOS%: 0.8 % (ref 0.0–7.0)
Eosinophils Absolute: 0 10*3/uL (ref 0.0–0.5)
HEMATOCRIT: 34.4 % — AB (ref 34.8–46.6)
HGB: 11.4 g/dL — ABNORMAL LOW (ref 11.6–15.9)
LYMPH%: 18.1 % (ref 14.0–49.7)
MCH: 31.5 pg (ref 25.1–34.0)
MCHC: 33.1 g/dL (ref 31.5–36.0)
MCV: 95.1 fL (ref 79.5–101.0)
MONO#: 0.8 10*3/uL (ref 0.1–0.9)
MONO%: 15.7 % — ABNORMAL HIGH (ref 0.0–14.0)
NEUT#: 3.4 10*3/uL (ref 1.5–6.5)
NEUT%: 64.7 % (ref 38.4–76.8)
PLATELETS: 250 10*3/uL (ref 145–400)
RBC: 3.61 10*6/uL — AB (ref 3.70–5.45)
RDW: 15.8 % — ABNORMAL HIGH (ref 11.2–14.5)
WBC: 5.2 10*3/uL (ref 3.9–10.3)
lymph#: 0.9 10*3/uL (ref 0.9–3.3)

## 2015-04-15 MED ORDER — SODIUM CHLORIDE 0.9 % IV SOLN
Freq: Once | INTRAVENOUS | Status: AC
  Administered 2015-04-15: 14:00:00 via INTRAVENOUS
  Filled 2015-04-15: qty 4

## 2015-04-15 MED ORDER — SODIUM CHLORIDE 0.9 % IV SOLN
Freq: Once | INTRAVENOUS | Status: AC
  Administered 2015-04-15: 14:00:00 via INTRAVENOUS

## 2015-04-15 MED ORDER — PACLITAXEL PROTEIN-BOUND CHEMO INJECTION 100 MG
55.0000 mg/m2 | Freq: Once | INTRAVENOUS | Status: AC
  Administered 2015-04-15: 100 mg via INTRAVENOUS
  Filled 2015-04-15: qty 20

## 2015-04-15 MED ORDER — HEPARIN SOD (PORK) LOCK FLUSH 100 UNIT/ML IV SOLN
500.0000 [IU] | Freq: Once | INTRAVENOUS | Status: AC | PRN
  Administered 2015-04-15: 500 [IU]
  Filled 2015-04-15: qty 5

## 2015-04-15 MED ORDER — SODIUM CHLORIDE 0.9 % IJ SOLN
10.0000 mL | INTRAMUSCULAR | Status: DC | PRN
  Administered 2015-04-15: 10 mL
  Filled 2015-04-15: qty 10

## 2015-04-15 NOTE — Patient Instructions (Signed)
[facility] Discharge Instructions for Patients Receiving Chemotherapy  Today you received the following chemotherapy agents:  Abraxane  To help prevent nausea and vomiting after your treatment, we encourage you to take your nausea medication as ordered per MD.   If you develop nausea and vomiting that is not controlled by your nausea medication, call the clinic.   BELOW ARE SYMPTOMS THAT SHOULD BE REPORTED IMMEDIATELY:  *FEVER GREATER THAN 100.5 F  *CHILLS WITH OR WITHOUT FEVER  NAUSEA AND VOMITING THAT IS NOT CONTROLLED WITH YOUR NAUSEA MEDICATION  *UNUSUAL SHORTNESS OF BREATH  *UNUSUAL BRUISING OR BLEEDING  TENDERNESS IN MOUTH AND THROAT WITH OR WITHOUT PRESENCE OF ULCERS  *URINARY PROBLEMS  *BOWEL PROBLEMS  UNUSUAL RASH Items with * indicate a potential emergency and should be followed up as soon as possible.  Feel free to call the clinic you have any questions or concerns. The clinic phone number is [phone].  Please show the CHEMO ALERT CARD at check-in to the Emergency Department and triage nurse.   

## 2015-04-15 NOTE — Progress Notes (Signed)
Patient Care Team: Secundino Ginger, [name], [name], [name], [name], [name], [name], NP as Nurse Practitioner (Nurse Practitioner)  DIAGNOSIS: Breast cancer of lower-outer quadrant of right female breast [facility])   Staging form: Breast, AJCC 7th Edition     Clinical stage from 12/11/2014: Stage IIA (T2, N0, M0) - Unsigned       Staging comments: Staged at breast conference on 7.20.16    SUMMARY OF ONCOLOGIC HISTORY:   Breast cancer of lower-outer quadrant of right female breast ([place])   12/03/2014 Mammogram Right breast mass 1.9 cm it o'clock position 8 cm depth from the nipple   12/03/2014 Initial Diagnosis Right breast biopsy 8:00: Invasive ductal carcinoma, grade 3, ER 0%, PR 0%, 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 She is today receiving cycle 9 of  Abraxane. We are watching her very closely because of a neuropathy issues. She has persistent tingling and numbness in the tips of the fingers.  She does not have any neuropathy in the feet. She is taking great care in moisturizing her feet.  REVIEW OF SYSTEMS:   Constitutional: Denies fevers, chills or abnormal weight loss Eyes:  Denies blurriness of vision Ears, nose, mouth, throat, and face: Denies mucositis or sore throat Respiratory: Denies cough, dyspnea or wheezes Cardiovascular: Denies palpitation, chest discomfort or lower extremity swelling Gastrointestinal:  Denies nausea, heartburn or change in bowel habits Skin: Denies abnormal skin rashes Lymphatics: Denies new lymphadenopathy or easy bruising Neurological: neuropathy in fingers Behavioral/Psych: Mood is stable, no new changes   All other systems were reviewed with the patient and are negative.  I have reviewed the past medical history, past surgical history, social history and family history with the patient and they are unchanged from previous note.  ALLERGIES:  has No Known Allergies.  MEDICATIONS:  Current Outpatient Prescriptions  Medication Sig Dispense Refill  . ALPRAZolam (XANAX) 1 MG tablet Take 2 tablets (2 mg total) by mouth at bedtime. 60 tablet 1  . cephALEXin (KEFLEX) 500 MG capsule Take 1 capsule (500 mg total) by mouth 4 (four) times daily. 20 capsule 0  . dexamethasone (DECADRON) 4 MG tablet Take 2 tablets by mouth once a day on the day after chemotherapy and then take 2 tablets two times a day for 2 days. 30 tablet 1  . lactulose (CHRONULAC) 10 GM/15ML solution Take 15 mLs (10 g total) by mouth 3 (three) times daily. 240 mL 0  . lidocaine-prilocaine (EMLA) cream Apply to affected area once 30 g 3  . lisinopril-hydrochlorothiazide (PRINZIDE,ZESTORETIC) 20-12.5 MG tablet Take 1 tablet by mouth 2 (two) times daily. 60 tablet 6  . LORazepam (ATIVAN) 1 MG tablet TAKE ONE TABLET BY MOUTH EVERY 6 HOURS AS NEEDED FOR NAUSEA AND  VOMITING 30 tablet 0  . ondansetron (ZOFRAN) 8 MG tablet Take 1 tablet (8 mg total) by mouth 2 (two) times daily as needed. Start on the third day after chemotherapy. 30 tablet 1  . oxyCODONE-acetaminophen (ROXICET) 5-325 MG per tablet Take 1-2 tablets by mouth every 4 (four) hours as needed. 50 tablet 0  .  prochlorperazine (COMPAZINE) 10 MG tablet Take 1 tablet (10 mg total) by mouth every 6 (six) hours as needed (Nausea or vomiting). 30 tablet 1  . sertraline (ZOLOFT) 50 MG tablet Take 1 tablet (50 mg total) by mouth daily. 30 tablet 6  . Vitamin D, Ergocalciferol, (DRISDOL) 50000 UNITS CAPS capsule Take 1 capsule (50,000 Units total) by mouth every 7 (seven) days. 12 capsule 3   No current facility-administered medications for this visit.   Facility-Administered Medications Ordered in Other Visits  Medication Dose Route Frequency Provider Last Rate Last Dose  . sodium chloride 0.9 % injection 10 mL  10 mL Intracatheter PRN [name], MD   10 mL at 03/04/15 1638    PHYSICAL EXAMINATION: ECOG PERFORMANCE STATUS: 1 - Symptomatic but completely ambulatory  Filed Vitals:   04/15/15 1327  BP: 120/79  Pulse: 82  Temp: 98 F (36.7 C)  Resp: 18   Filed Weights   04/15/15 1327  Weight: 173 lb 11.2 oz (78.79 kg)    GENERAL:alert, no distress and comfortable SKIN: skin color, texture, turgor are normal, no rashes or significant lesions EYES: normal, Conjunctiva are pink and non-injected, sclera clear OROPHARYNX:no exudate, no erythema and lips, buccal mucosa, and tongue normal  NECK: supple, thyroid normal size, non-tender, without nodularity LYMPH:  no palpable lymphadenopathy in the cervical, axillary or inguinal LUNGS: clear to auscultation and percussion with normal breathing effort HEART: regular rate & rhythm and no murmurs and no lower extremity edema ABDOMEN:abdomen soft, non-tender and normal bowel sounds Musculoskeletal:no cyanosis of digits and no clubbing  NEURO: alert & oriented x 3 with fluent speech,  Grade 1-2 neuropathy   LABORATORY DATA:  I have reviewed the data as listed   Chemistry      Component Value Date/Time   NA 140 04/08/2015 0853   K 4.6 04/08/2015 0853   CO2 24 04/08/2015 0853   BUN 11.0 04/08/2015 0853   CREATININE 0.7 04/08/2015 0853        Component Value Date/Time   CALCIUM 9.1 04/08/2015 0853   ALKPHOS 67 04/08/2015 0853   AST 19 04/08/2015 0853   ALT 21 04/08/2015 0853   BILITOT 0.52 04/08/2015 0853       Lab Results  Component Value Date   WBC 5.2 04/15/2015   HGB 11.4* 04/15/2015   HCT 34.4* 04/15/2015   MCV 95.1 04/15/2015   PLT 250 04/15/2015   NEUTROABS 3.4 04/15/2015   ASSESSMENT & PLAN:  Breast cancer of lower-outer quadrant of right female breast Right breast biopsy 12/03/2014 8:00: Invasive ductal carcinoma, grade 3, ER 0%, PR 0%, 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 Treatment plan: 1. Neo-adjuvant chemotherapy with dose dense Adriamycin and Cytoxan 4 followed by Abraxane once a week 12 ( we wanted to give her carboplatin and Abraxane but insurance did not authorize the treatment) 2. Followed by repeat breast MRI followed by surgery 3. Followed by radiation -------------------------------------------------------------------------------------------------------------------------------------------------- Current treatment: Completed 4 cycles of AC; Today is cycle 9 Abraxane (  Carboplatin was given with cycle 1 but insurance did not authorize it , so we discontinued it further)   Chemotherapy toxicities: 1. Constipation: resolved 2. Fatigue related to chemotherapy day 3-5 3. Insomnia: Continue Ativan 2 tablets at bedtime. Renewed 1 mg daily 4. Anxiety:  5. Nausea and vomiting 1 6. 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  neuropathy stable 8. Leukopenia: Slowly decreasing with each treatment. With the reduced dosage of Abraxane, leukopenia is improved  Monitoring closely for toxicities. Return to clinic in  1 week for cycle 10/12 Abraxane   No orders of the defined types were placed in this encounter.   The patient has a good understanding of the overall plan. she agrees with it. she will call with any problems that may develop before the next visit here.   Rulon Eisenmenger, MD 04/15/2015

## 2015-04-16 ENCOUNTER — Telehealth: Payer: Self-pay | Admitting: Hematology and Oncology

## 2015-04-16 NOTE — Telephone Encounter (Signed)
Called patient and added dr Lindi Adie to 11/29anne

## 2015-04-22 ENCOUNTER — Encounter: Payer: Self-pay | Admitting: Hematology and Oncology

## 2015-04-22 ENCOUNTER — Ambulatory Visit (HOSPITAL_BASED_OUTPATIENT_CLINIC_OR_DEPARTMENT_OTHER): Payer: BLUE CROSS/BLUE SHIELD

## 2015-04-22 ENCOUNTER — Ambulatory Visit (HOSPITAL_BASED_OUTPATIENT_CLINIC_OR_DEPARTMENT_OTHER): Payer: BLUE CROSS/BLUE SHIELD | Admitting: Hematology and Oncology

## 2015-04-22 ENCOUNTER — Ambulatory Visit: Payer: BLUE CROSS/BLUE SHIELD

## 2015-04-22 ENCOUNTER — Other Ambulatory Visit: Payer: BLUE CROSS/BLUE SHIELD

## 2015-04-22 ENCOUNTER — Other Ambulatory Visit (HOSPITAL_BASED_OUTPATIENT_CLINIC_OR_DEPARTMENT_OTHER): Payer: BLUE CROSS/BLUE SHIELD

## 2015-04-22 ENCOUNTER — Encounter: Payer: Self-pay | Admitting: *Deleted

## 2015-04-22 VITALS — BP 139/88 | HR 83 | Temp 97.9°F | Resp 18 | Ht 63.0 in | Wt 177.1 lb

## 2015-04-22 DIAGNOSIS — Z171 Estrogen receptor negative status [ER-]: Secondary | ICD-10-CM | POA: Diagnosis not present

## 2015-04-22 DIAGNOSIS — G620 Drug-induced polyneuropathy: Secondary | ICD-10-CM

## 2015-04-22 DIAGNOSIS — C50511 Malignant neoplasm of lower-outer quadrant of right female breast: Secondary | ICD-10-CM

## 2015-04-22 DIAGNOSIS — Z5111 Encounter for antineoplastic chemotherapy: Secondary | ICD-10-CM | POA: Diagnosis not present

## 2015-04-22 LAB — CBC WITH DIFFERENTIAL/PLATELET
BASO%: 0.4 % (ref 0.0–2.0)
Basophils Absolute: 0 10*3/uL (ref 0.0–0.1)
EOS%: 1.1 % (ref 0.0–7.0)
Eosinophils Absolute: 0.1 10*3/uL (ref 0.0–0.5)
HCT: 33.2 % — ABNORMAL LOW (ref 34.8–46.6)
HGB: 11 g/dL — ABNORMAL LOW (ref 11.6–15.9)
LYMPH%: 19.3 % (ref 14.0–49.7)
MCH: 31.6 pg (ref 25.1–34.0)
MCHC: 33.1 g/dL (ref 31.5–36.0)
MCV: 95.4 fL (ref 79.5–101.0)
MONO#: 0.5 10*3/uL (ref 0.1–0.9)
MONO%: 8.6 % (ref 0.0–14.0)
NEUT%: 70.6 % (ref 38.4–76.8)
NEUTROS ABS: 4 10*3/uL (ref 1.5–6.5)
Platelets: 216 10*3/uL (ref 145–400)
RBC: 3.48 10*6/uL — AB (ref 3.70–5.45)
RDW: 14.1 % (ref 11.2–14.5)
WBC: 5.7 10*3/uL (ref 3.9–10.3)
lymph#: 1.1 10*3/uL (ref 0.9–3.3)

## 2015-04-22 LAB — COMPREHENSIVE METABOLIC PANEL (CC13)
ALT: 21 U/L (ref 0–55)
AST: 21 U/L (ref 5–34)
Albumin: 3.6 g/dL (ref 3.5–5.0)
Alkaline Phosphatase: 62 U/L (ref 40–150)
Anion Gap: 6 mEq/L (ref 3–11)
BILIRUBIN TOTAL: 0.51 mg/dL (ref 0.20–1.20)
BUN: 8.7 mg/dL (ref 7.0–26.0)
CHLORIDE: 107 meq/L (ref 98–109)
CO2: 28 meq/L (ref 22–29)
Calcium: 9.5 mg/dL (ref 8.4–10.4)
Creatinine: 0.7 mg/dL (ref 0.6–1.1)
GLUCOSE: 92 mg/dL (ref 70–140)
Potassium: 3.8 mEq/L (ref 3.5–5.1)
SODIUM: 141 meq/L (ref 136–145)
TOTAL PROTEIN: 6.8 g/dL (ref 6.4–8.3)

## 2015-04-22 MED ORDER — SODIUM CHLORIDE 0.9 % IV SOLN
Freq: Once | INTRAVENOUS | Status: AC
  Administered 2015-04-22: 16:00:00 via INTRAVENOUS

## 2015-04-22 MED ORDER — SODIUM CHLORIDE 0.9 % IV SOLN
Freq: Once | INTRAVENOUS | Status: AC
  Administered 2015-04-22: 16:00:00 via INTRAVENOUS
  Filled 2015-04-22: qty 4

## 2015-04-22 MED ORDER — PACLITAXEL PROTEIN-BOUND CHEMO INJECTION 100 MG
55.0000 mg/m2 | Freq: Once | INTRAVENOUS | Status: AC
  Administered 2015-04-22: 100 mg via INTRAVENOUS
  Filled 2015-04-22: qty 20

## 2015-04-22 MED ORDER — SODIUM CHLORIDE 0.9 % IJ SOLN
10.0000 mL | INTRAMUSCULAR | Status: DC | PRN
  Administered 2015-04-22: 10 mL
  Filled 2015-04-22: qty 10

## 2015-04-22 MED ORDER — HEPARIN SOD (PORK) LOCK FLUSH 100 UNIT/ML IV SOLN
500.0000 [IU] | Freq: Once | INTRAVENOUS | Status: AC | PRN
  Administered 2015-04-22: 500 [IU]
  Filled 2015-04-22: qty 5

## 2015-04-22 NOTE — Addendum Note (Signed)
Addended by: Prentiss Bells on: 04/22/2015 04:54 PM   Modules accepted: Medications

## 2015-04-22 NOTE — Patient Instructions (Signed)
[place] Discharge Instructions for Patients Receiving Chemotherapy  Today you received the following chemotherapy agents: Abraxane.   To help prevent nausea and vomiting after your treatment, we encourage you to take your nausea medication: Compazine 10 mg every 6 hours as needed and Zofran 8mg  every 12 hours as needed.   If you develop nausea and vomiting that is not controlled by your nausea medication, call the clinic.   BELOW ARE SYMPTOMS THAT SHOULD BE REPORTED IMMEDIATELY:  *FEVER GREATER THAN 100.5 F  *CHILLS WITH OR WITHOUT FEVER  NAUSEA AND VOMITING THAT IS NOT CONTROLLED WITH YOUR NAUSEA MEDICATION  *UNUSUAL SHORTNESS OF BREATH  *UNUSUAL BRUISING OR BLEEDING  TENDERNESS IN MOUTH AND THROAT WITH OR WITHOUT PRESENCE OF ULCERS  *URINARY PROBLEMS  *BOWEL PROBLEMS  UNUSUAL RASH Items with * indicate a potential emergency and should be followed up as soon as possible.  Feel free to call the clinic you have any questions or concerns. The clinic phone number is [phone].  Please show the [place] at check-in to the Emergency Department and triage nurse.

## 2015-04-22 NOTE — Progress Notes (Signed)
Patient Care Team: Secundino Ginger, [name], [name], [name], [name], [name], [name], NP as Nurse Practitioner (Nurse Practitioner)  DIAGNOSIS: Breast cancer of lower-outer quadrant of right female breast [facility])   Staging form: Breast, AJCC 7th Edition     Clinical stage from 12/11/2014: Stage IIA (T2, N0, M0) - Unsigned       Staging comments: Staged at breast conference on 7.20.16    SUMMARY OF ONCOLOGIC HISTORY:   Breast cancer of lower-outer quadrant of right female breast ([place])   12/03/2014 Mammogram Right breast mass 1.9 cm it o'clock position 8 cm depth from the nipple   12/03/2014 Initial Diagnosis Right breast biopsy 8:00: Invasive ductal carcinoma, grade 3, ER 0%, PR 0%, 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 She is receiving Abraxane No. 10 today. She was starting to have grade 1 neuropathy and we reduced the dosage and since then her neuropathy is been stable to slightly better. Energy levels are still low. She feels puffy and has gained about 15 pounds throughout chemotherapy.denies any nausea vomiting.  REVIEW OF SYSTEMS:   Constitutional: Denies fevers, chills or abnormal weight  loss Eyes: Denies blurriness of vision Ears, nose, mouth, throat, and face: Denies mucositis or sore throat Respiratory: Denies cough, dyspnea or wheezes Cardiovascular: Denies palpitation, chest discomfort or lower extremity swelling Gastrointestinal:  Denies nausea, heartburn or change in bowel habits Skin: Denies abnormal skin rashes Lymphatics: Denies new lymphadenopathy or easy bruising Neurological:Denies numbness, tingling or new weaknesses Behavioral/Psych: Mood is stable, no new changes   All other systems were reviewed with the patient and are negative.  I have reviewed the past medical history, past surgical history, social history and family history with the patient and they are unchanged from previous note.  ALLERGIES:  has No Known Allergies.  MEDICATIONS:  Current Outpatient Prescriptions  Medication Sig Dispense Refill  . ALPRAZolam (XANAX) 1 MG tablet Take 2 tablets (2 mg total) by mouth at bedtime. 60 tablet 1  . lactulose (CHRONULAC) 10 GM/15ML solution Take 15 mLs (10 g total) by mouth 3 (three) times daily. 240 mL 0  . lidocaine-prilocaine (EMLA) cream Apply to affected area once 30 g 3  . lisinopril-hydrochlorothiazide (PRINZIDE,ZESTORETIC) 20-12.5 MG tablet Take 1 tablet by mouth 2 (two) times daily. 60 tablet 6  . LORazepam (ATIVAN) 1 MG tablet TAKE ONE TABLET BY MOUTH EVERY 6 HOURS AS NEEDED FOR NAUSEA AND VOMITING 30 tablet 0  . prochlorperazine (COMPAZINE) 10 MG tablet Take 1 tablet (10 mg total) by mouth every 6 (six) hours as needed (Nausea or vomiting). 30 tablet 1  . sertraline (ZOLOFT) 50 MG tablet Take 1 tablet (50 mg total) by mouth daily. 30 tablet 6  . Vitamin D, Ergocalciferol, (DRISDOL) 50000 UNITS CAPS capsule Take 1  capsule (50,000 Units total) by mouth every 7 (seven) days. 12 capsule 3   No current facility-administered medications for this visit.   Facility-Administered Medications Ordered in Other Visits  Medication Dose Route Frequency  Provider Last Rate Last Dose  . heparin lock flush 100 unit/mL  500 Units Intracatheter Once PRN [name], MD      . ondansetron (ZOFRAN) 8 mg, dexamethasone (DECADRON) 10 mg in sodium chloride 0.9 % 50 mL IVPB   Intravenous Once [name], MD      . sodium chloride 0.9 % injection 10 mL  10 mL Intracatheter PRN [name], MD   10 mL at 03/04/15 1638  . sodium chloride 0.9 % injection 10 mL  10 mL Intracatheter PRN [name], MD        PHYSICAL EXAMINATION: ECOG PERFORMANCE STATUS: 1 - Symptomatic but completely ambulatory  Filed Vitals:   04/22/15 1432  BP: 139/88  Pulse: 83  Temp: 97.9 F (36.6 C)  Resp: 18   Filed Weights   04/22/15 1432  Weight: 177 lb 1.6 oz (80.332 kg)    GENERAL:alert, no distress and comfortable SKIN: skin color, texture, turgor are normal, no rashes or significant lesions EYES: normal, Conjunctiva are pink and non-injected, sclera clear OROPHARYNX:no exudate, no erythema and lips, buccal mucosa, and tongue normal  NECK: supple, thyroid normal size, non-tender, without nodularity LYMPH:  no palpable lymphadenopathy in the cervical, axillary or inguinal LUNGS: clear to auscultation and percussion with normal breathing effort HEART: regular rate & rhythm and no murmurs and no lower extremity edema ABDOMEN:abdomen soft, non-tender and normal bowel sounds Musculoskeletal:no cyanosis of digits and no clubbing  NEURO: alert & oriented x 3 with fluent speech, neuropathy grade 1  LABORATORY DATA:  I have reviewed the data as listed   Chemistry      Component Value Date/Time   NA 141 04/22/2015 1421   K 3.8 04/22/2015 1421   CO2 28 04/22/2015 1421   BUN 8.7 04/22/2015 1421   CREATININE 0.7 04/22/2015 1421      Component Value Date/Time   CALCIUM 9.5 04/22/2015 1421   ALKPHOS 62 04/22/2015 1421   AST 21 04/22/2015 1421   ALT 21 04/22/2015 1421   BILITOT 0.51 04/22/2015 1421       Lab Results  Component Value Date   WBC 5.7  04/22/2015   HGB 11.0* 04/22/2015   HCT 33.2* 04/22/2015   MCV 95.4 04/22/2015   PLT 216 04/22/2015   NEUTROABS 4.0 04/22/2015   ASSESSMENT & PLAN:  Breast cancer of lower-outer quadrant of right female breast Right breast biopsy 12/03/2014 8:00: Invasive ductal carcinoma, grade 3, ER 0%, PR 0%, 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 Treatment plan: 1. Neo-adjuvant chemotherapy with dose dense Adriamycin and Cytoxan 4 followed by Abraxane once a week 12 ( we wanted to give her carboplatin and Abraxane but insurance did not authorize the treatment) 2. Followed by repeat breast MRI followed by surgery 3. Followed by radiation -------------------------------------------------------------------------------------------------------------------------------------------------- Current treatment: Completed 4 cycles of AC; Today is cycle 10 Abraxane (Carboplatin was given with cycle 1 but insurance did not authorize it , so we discontinued it further)   Chemotherapy toxicities: 1. Constipation: resolved 2. Fatigue related to chemotherapy day 3-5 3. Insomnia: Continue Ativan 2 tablets at bedtime. Renewed 1 mg daily 4. Anxiety:  5.  Nausea and vomiting 1 6. 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  neuropathy stable 8. Leukopenia: Slowly decreasing with each treatment. With the reduced dosage of Abraxane, leukopenia is improved  Monitoring closely for toxicities. Return to clinic in 2 week for cycle 12/12 Abraxane Plan: We will make arrangements for breast MRI, tumor board discussion and surgery follow-ups.   Orders Placed This Encounter  Procedures  . MR Breast Bilateral W Wo Contrast    Standing  Status: Future     Number of Occurrences:      Standing Expiration Date: 06/21/2016    Order Specific Question:  If indicated for the ordered procedure, I authorize the administration of contrast media per Radiology protocol    Answer:  Yes    Order Specific Question:  Reason for Exam (SYMPTOM  OR DIAGNOSIS REQUIRED)    Answer:  Breast cancer popst neoadjuvant chemo    Order Specific Question:  Preferred imaging location?    Answer:  [id number]    Order Specific Question:  Does the patient have a pacemaker or implanted devices?    Answer:  No    Order Specific Question:  What is the patient's sedation requirement?    Answer:  No Sedation   The patient has a good understanding of the overall plan. she agrees with it. she will call with any problems that may develop before the next visit here.   Rulon Eisenmenger, MD 04/22/2015

## 2015-04-22 NOTE — Assessment & Plan Note (Signed)
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 Treatment plan: 1. Neo-adjuvant chemotherapy with dose dense Adriamycin and Cytoxan 4 followed by Abraxane once a week 12 ( we wanted to give her carboplatin and Abraxane but insurance did not authorize the treatment) 2. Followed by repeat breast MRI followed by surgery 3. Followed by radiation -------------------------------------------------------------------------------------------------------------------------------------------------- Current treatment: Completed 4 cycles of AC; Today is cycle 10 Abraxane (Carboplatin was given with cycle 1 but insurance did not authorize it , so we discontinued it further)   Chemotherapy toxicities: 1. Constipation: resolved 2. Fatigue related to chemotherapy day 3-5 3. Insomnia: Continue Ativan 2 tablets at bedtime. Renewed 1 mg daily 4. Anxiety:  5. Nausea and vomiting 1 6. 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  neuropathy stable 8. Leukopenia: Slowly decreasing with each treatment. With the reduced dosage of Abraxane, leukopenia is improved  Monitoring closely for toxicities. Return to clinic in 1 week for cycle 11/12 Abraxane Plan: We will make arrangements for breast MRI, tumor board discussion and surgery follow-ups.

## 2015-04-29 ENCOUNTER — Ambulatory Visit: Payer: BLUE CROSS/BLUE SHIELD

## 2015-04-29 ENCOUNTER — Ambulatory Visit (HOSPITAL_BASED_OUTPATIENT_CLINIC_OR_DEPARTMENT_OTHER): Payer: BLUE CROSS/BLUE SHIELD

## 2015-04-29 ENCOUNTER — Other Ambulatory Visit (HOSPITAL_BASED_OUTPATIENT_CLINIC_OR_DEPARTMENT_OTHER): Payer: BLUE CROSS/BLUE SHIELD

## 2015-04-29 VITALS — BP 139/96 | HR 86 | Temp 97.6°F | Resp 18

## 2015-04-29 DIAGNOSIS — C50511 Malignant neoplasm of lower-outer quadrant of right female breast: Secondary | ICD-10-CM

## 2015-04-29 DIAGNOSIS — Z5111 Encounter for antineoplastic chemotherapy: Secondary | ICD-10-CM

## 2015-04-29 DIAGNOSIS — D72819 Decreased white blood cell count, unspecified: Secondary | ICD-10-CM

## 2015-04-29 LAB — CBC WITH DIFFERENTIAL/PLATELET
BASO%: 0.7 % (ref 0.0–2.0)
Basophils Absolute: 0 10*3/uL (ref 0.0–0.1)
EOS%: 1 % (ref 0.0–7.0)
Eosinophils Absolute: 0 10*3/uL (ref 0.0–0.5)
HEMATOCRIT: 34.6 % — AB (ref 34.8–46.6)
HEMOGLOBIN: 11.3 g/dL — AB (ref 11.6–15.9)
LYMPH#: 0.8 10*3/uL — AB (ref 0.9–3.3)
LYMPH%: 19.4 % (ref 14.0–49.7)
MCH: 31.3 pg (ref 25.1–34.0)
MCHC: 32.8 g/dL (ref 31.5–36.0)
MCV: 95.5 fL (ref 79.5–101.0)
MONO#: 0.5 10*3/uL (ref 0.1–0.9)
MONO%: 12.1 % (ref 0.0–14.0)
NEUT%: 66.8 % (ref 38.4–76.8)
NEUTROS ABS: 2.8 10*3/uL (ref 1.5–6.5)
PLATELETS: 229 10*3/uL (ref 145–400)
RBC: 3.62 10*6/uL — ABNORMAL LOW (ref 3.70–5.45)
RDW: 14.4 % (ref 11.2–14.5)
WBC: 4.3 10*3/uL (ref 3.9–10.3)

## 2015-04-29 LAB — COMPREHENSIVE METABOLIC PANEL
ALT: 19 U/L (ref 0–55)
AST: 20 U/L (ref 5–34)
Albumin: 3.7 g/dL (ref 3.5–5.0)
Alkaline Phosphatase: 71 U/L (ref 40–150)
Anion Gap: 8 mEq/L (ref 3–11)
BILIRUBIN TOTAL: 0.54 mg/dL (ref 0.20–1.20)
BUN: 9.5 mg/dL (ref 7.0–26.0)
CALCIUM: 9.4 mg/dL (ref 8.4–10.4)
CHLORIDE: 107 meq/L (ref 98–109)
CO2: 24 mEq/L (ref 22–29)
CREATININE: 0.8 mg/dL (ref 0.6–1.1)
EGFR: >90 mL/min/{1.73_m2} (ref >90)
Glucose: 109 mg/dl (ref 70–140)
Potassium: 3.8 mEq/L (ref 3.5–5.1)
Sodium: 140 mEq/L (ref 136–145)
TOTAL PROTEIN: 7.1 g/dL (ref 6.4–8.3)

## 2015-04-29 MED ORDER — PALONOSETRON HCL INJECTION 0.25 MG/5ML
INTRAVENOUS | Status: AC
  Filled 2015-04-29: qty 5

## 2015-04-29 MED ORDER — SODIUM CHLORIDE 0.9 % IJ SOLN
10.0000 mL | INTRAMUSCULAR | Status: DC | PRN
  Administered 2015-04-29: 10 mL
  Filled 2015-04-29: qty 10

## 2015-04-29 MED ORDER — PACLITAXEL PROTEIN-BOUND CHEMO INJECTION 100 MG
55.0000 mg/m2 | Freq: Once | INTRAVENOUS | Status: AC
  Administered 2015-04-29: 100 mg via INTRAVENOUS
  Filled 2015-04-29: qty 20

## 2015-04-29 MED ORDER — HEPARIN SOD (PORK) LOCK FLUSH 100 UNIT/ML IV SOLN
500.0000 [IU] | Freq: Once | INTRAVENOUS | Status: AC | PRN
  Administered 2015-04-29: 500 [IU]
  Filled 2015-04-29: qty 5

## 2015-04-29 MED ORDER — SODIUM CHLORIDE 0.9 % IV SOLN
Freq: Once | INTRAVENOUS | Status: AC
  Administered 2015-04-29: 15:00:00 via INTRAVENOUS

## 2015-04-29 MED ORDER — SODIUM CHLORIDE 0.9 % IV SOLN
Freq: Once | INTRAVENOUS | Status: AC
  Administered 2015-04-29: 15:00:00 via INTRAVENOUS
  Filled 2015-04-29: qty 4

## 2015-04-29 NOTE — Patient Instructions (Signed)
[place] Discharge Instructions for Patients Receiving Chemotherapy  Today you received the following chemotherapy agents: Abraxane.   To help prevent nausea and vomiting after your treatment, we encourage you to take your nausea medication: Compazine 10 mg every 6 hours as needed and Zofran 8mg  every 12 hours as needed.   If you develop nausea and vomiting that is not controlled by your nausea medication, call the clinic.   BELOW ARE SYMPTOMS THAT SHOULD BE REPORTED IMMEDIATELY:  *FEVER GREATER THAN 100.5 F  *CHILLS WITH OR WITHOUT FEVER  NAUSEA AND VOMITING THAT IS NOT CONTROLLED WITH YOUR NAUSEA MEDICATION  *UNUSUAL SHORTNESS OF BREATH  *UNUSUAL BRUISING OR BLEEDING  TENDERNESS IN MOUTH AND THROAT WITH OR WITHOUT PRESENCE OF ULCERS  *URINARY PROBLEMS  *BOWEL PROBLEMS  UNUSUAL RASH Items with * indicate a potential emergency and should be followed up as soon as possible.  Feel free to call the clinic you have any questions or concerns. The clinic phone number is [phone].  Please show the [place] at check-in to the Emergency Department and triage nurse.

## 2015-05-02 ENCOUNTER — Ambulatory Visit
Admission: RE | Admit: 2015-05-02 | Discharge: 2015-05-02 | Disposition: A | Discharge status: Home / Self Care | Payer: BLUE CROSS/BLUE SHIELD | Source: Ambulatory Visit | Attending: Hematology and Oncology | Admitting: Hematology and Oncology

## 2015-05-02 DIAGNOSIS — C50511 Malignant neoplasm of lower-outer quadrant of right female breast: Secondary | ICD-10-CM

## 2015-05-02 MED ORDER — GADOBENATE DIMEGLUMINE 529 MG/ML IV SOLN
15.0000 mL | Freq: Once | INTRAVENOUS | Status: AC | PRN
  Administered 2015-05-02: 15 mL via INTRAVENOUS

## 2015-05-05 NOTE — Assessment & Plan Note (Signed)
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 Treatment plan: 1. Neo-adjuvant chemotherapy with dose dense Adriamycin and Cytoxan 4 followed by Abraxane once a week 12 ( we wanted to give her carboplatin and Abraxane but insurance did not authorize the treatment) 2. Followed by repeat breast MRI followed by surgery 3. Followed by radiation -------------------------------------------------------------------------------------------------------------------------------------------------- Current treatment: Completed 4 cycles of AC; Today is cycle 12/12 Abraxane (Carboplatin was given with cycle 1 but insurance did not authorize it , so we discontinued it further)   Chemotherapy toxicities: 1. Constipation: resolved 2. Fatigue related to chemotherapy day 3-5 3. Insomnia: Continue Ativan 2 tablets at bedtime. Renewed 1 mg daily 4. Anxiety:  5. Nausea and vomiting 1 6. 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  neuropathy stable 8. Leukopenia: Slowly decreasing with each treatment. With the reduced dosage of Abraxane, leukopenia is improved  Monitoring closely for toxicities.  Plan: 1. Breast MRI and follow up 2. Surgery F/U 3. Tumor Board presentation.

## 2015-05-06 ENCOUNTER — Other Ambulatory Visit (HOSPITAL_BASED_OUTPATIENT_CLINIC_OR_DEPARTMENT_OTHER): Payer: BLUE CROSS/BLUE SHIELD

## 2015-05-06 ENCOUNTER — Ambulatory Visit (HOSPITAL_BASED_OUTPATIENT_CLINIC_OR_DEPARTMENT_OTHER): Payer: BLUE CROSS/BLUE SHIELD | Admitting: Hematology and Oncology

## 2015-05-06 ENCOUNTER — Encounter: Payer: Self-pay | Admitting: Hematology and Oncology

## 2015-05-06 ENCOUNTER — Encounter: Payer: Self-pay | Admitting: *Deleted

## 2015-05-06 ENCOUNTER — Ambulatory Visit: Payer: BLUE CROSS/BLUE SHIELD

## 2015-05-06 ENCOUNTER — Ambulatory Visit (HOSPITAL_BASED_OUTPATIENT_CLINIC_OR_DEPARTMENT_OTHER): Payer: BLUE CROSS/BLUE SHIELD

## 2015-05-06 VITALS — BP 121/85 | HR 68 | Temp 99.0°F | Resp 18 | Ht 63.0 in | Wt 175.0 lb

## 2015-05-06 DIAGNOSIS — G4709 Other insomnia: Secondary | ICD-10-CM

## 2015-05-06 DIAGNOSIS — Z171 Estrogen receptor negative status [ER-]: Secondary | ICD-10-CM

## 2015-05-06 DIAGNOSIS — C50511 Malignant neoplasm of lower-outer quadrant of right female breast: Secondary | ICD-10-CM

## 2015-05-06 DIAGNOSIS — G620 Drug-induced polyneuropathy: Secondary | ICD-10-CM | POA: Diagnosis not present

## 2015-05-06 DIAGNOSIS — Z5111 Encounter for antineoplastic chemotherapy: Secondary | ICD-10-CM

## 2015-05-06 DIAGNOSIS — D72818 Other decreased white blood cell count: Secondary | ICD-10-CM

## 2015-05-06 LAB — CBC WITH DIFFERENTIAL/PLATELET
BASO%: 0.6 % (ref 0.0–2.0)
Basophils Absolute: 0 10*3/uL (ref 0.0–0.1)
EOS ABS: 0.1 10*3/uL (ref 0.0–0.5)
EOS%: 1.3 % (ref 0.0–7.0)
HEMATOCRIT: 35.9 % (ref 34.8–46.6)
HEMOGLOBIN: 11.8 g/dL (ref 11.6–15.9)
LYMPH#: 1 10*3/uL (ref 0.9–3.3)
LYMPH%: 21.3 % (ref 14.0–49.7)
MCH: 31.1 pg (ref 25.1–34.0)
MCHC: 32.9 g/dL (ref 31.5–36.0)
MCV: 94.7 fL (ref 79.5–101.0)
MONO#: 0.4 10*3/uL (ref 0.1–0.9)
MONO%: 7.7 % (ref 0.0–14.0)
NEUT%: 69.1 % (ref 38.4–76.8)
NEUTROS ABS: 3.2 10*3/uL (ref 1.5–6.5)
Platelets: 238 10*3/uL (ref 145–400)
RBC: 3.79 10*6/uL (ref 3.70–5.45)
RDW: 13 % (ref 11.2–14.5)
WBC: 4.7 10*3/uL (ref 3.9–10.3)

## 2015-05-06 LAB — COMPREHENSIVE METABOLIC PANEL
ALBUMIN: 3.8 g/dL (ref 3.5–5.0)
ALK PHOS: 75 U/L (ref 40–150)
ALT: 16 U/L (ref 0–55)
AST: 17 U/L (ref 5–34)
Anion Gap: 9 mEq/L (ref 3–11)
BILIRUBIN TOTAL: 0.35 mg/dL (ref 0.20–1.20)
BUN: 9.7 mg/dL (ref 7.0–26.0)
CALCIUM: 9.2 mg/dL (ref 8.4–10.4)
CO2: 23 mEq/L (ref 22–29)
Chloride: 107 mEq/L (ref 98–109)
Creatinine: 0.7 mg/dL (ref 0.6–1.1)
EGFR: >90 mL/min/{1.73_m2} (ref >90)
GLUCOSE: 86 mg/dL (ref 70–140)
Potassium: 3.6 mEq/L (ref 3.5–5.1)
SODIUM: 138 meq/L (ref 136–145)
TOTAL PROTEIN: 7.1 g/dL (ref 6.4–8.3)

## 2015-05-06 MED ORDER — SODIUM CHLORIDE 0.9 % IJ SOLN
10.0000 mL | INTRAMUSCULAR | Status: DC | PRN
  Administered 2015-05-06: 10 mL
  Filled 2015-05-06: qty 10

## 2015-05-06 MED ORDER — HEPARIN SOD (PORK) LOCK FLUSH 100 UNIT/ML IV SOLN
500.0000 [IU] | Freq: Once | INTRAVENOUS | Status: AC | PRN
  Administered 2015-05-06: 500 [IU]
  Filled 2015-05-06: qty 5

## 2015-05-06 MED ORDER — SODIUM CHLORIDE 0.9 % IV SOLN
Freq: Once | INTRAVENOUS | Status: AC
  Administered 2015-05-06: 15:00:00 via INTRAVENOUS

## 2015-05-06 MED ORDER — PACLITAXEL PROTEIN-BOUND CHEMO INJECTION 100 MG
55.0000 mg/m2 | Freq: Once | INTRAVENOUS | Status: AC
  Administered 2015-05-06: 100 mg via INTRAVENOUS
  Filled 2015-05-06: qty 20

## 2015-05-06 MED ORDER — DEXAMETHASONE SODIUM PHOSPHATE 100 MG/10ML IJ SOLN
Freq: Once | INTRAMUSCULAR | Status: AC
  Administered 2015-05-06: 15:00:00 via INTRAVENOUS
  Filled 2015-05-06: qty 4

## 2015-05-06 NOTE — Progress Notes (Signed)
Patient Care Team: Secundino Ginger, [name], [name], [name], [name], [name], [name], NP as Nurse Practitioner (Nurse Practitioner)  DIAGNOSIS: Breast cancer of lower-outer quadrant of right female breast [facility])   Staging form: Breast, AJCC 7th Edition     Clinical stage from 12/11/2014: Stage IIA (T2, N0, M0) - Unsigned       Staging comments: Staged at breast conference on 7.20.16    SUMMARY OF ONCOLOGIC HISTORY:   Breast cancer of lower-outer quadrant of right female breast ([place])   12/03/2014 Mammogram Right breast mass 1.9 cm it o'clock position 8 cm depth from the nipple   12/03/2014 Initial Diagnosis Right breast biopsy 8:00: Invasive ductal carcinoma, grade 3, ER 0%, PR 0%, 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%, HER-2 negative ratio 1.43   12/10/2014 Breast MRI Right breast lower outer quadrant: 2.3 x 2.4 x 2.4 cm rim-enhancing mass abuts the pectoralis fascia but no enhancement of pectoralis muscle, second focus of artifact?'s second tissue marker clip, no lymph nodes   12/24/2014 - 04/29/2015 Neo-Adjuvant Chemotherapy Dose dense Adriamycin and Cytoxan 4 followed by weekly Abraxane 12   05/02/2015 Breast MRI complete radiologic response    CHIEF COMPLIANT: follow-up after breast MRI, cycle 12 of Abraxane  INTERVAL HISTORY: Kaitlyn Keith is a 49 year old with above-mentioned history of right breast cancer who is currently receiving neoadjuvant chemotherapy. Today's cycle 12 Abraxane. She had breast MRI last week and is here to discuss those results. She reports no major problems or concerns. Neuropathy stable. Blood counts were reviewed and they're normal.  REVIEW OF SYSTEMS:   Constitutional: Denies fevers, chills or abnormal weight  loss Eyes: Denies blurriness of vision Ears, nose, mouth, throat, and face: Denies mucositis or sore throat Respiratory: Denies cough, dyspnea or wheezes Cardiovascular: Denies palpitation, chest discomfort or lower extremity swelling Gastrointestinal:  Denies nausea, heartburn or change in bowel habits Skin: Denies abnormal skin rashes Lymphatics: Denies new lymphadenopathy or easy bruising Neurological:mild tingling and numbness of hands and feet Behavioral/Psych: Mood is stable, no new changes  Breast:  denies any pain or lumps or nodules in either breasts All other systems were reviewed with the patient and are negative.  I have reviewed the past medical history, past surgical history, social history and family history with the patient and they are unchanged from previous note.  ALLERGIES:  has No Known Allergies.  MEDICATIONS:  Current Outpatient Prescriptions  Medication Sig Dispense Refill  . ALPRAZolam (XANAX) 1 MG tablet Take 2 tablets (2 mg total) by mouth at bedtime. 60 tablet 1  . lactulose (CHRONULAC) 10 GM/15ML solution Take 15 mLs (10 g total) by mouth 3 (three) times daily. 240 mL 0  . lidocaine-prilocaine (EMLA) cream Apply to affected area once 30 g 3  . lisinopril-hydrochlorothiazide (PRINZIDE,ZESTORETIC) 20-12.5 MG tablet Take 1 tablet by mouth 2 (two) times daily. 60 tablet 6  . LORazepam (ATIVAN) 1 MG tablet TAKE ONE TABLET BY MOUTH EVERY 6 HOURS AS NEEDED FOR NAUSEA AND VOMITING 30 tablet 0  . prochlorperazine (COMPAZINE) 10 MG tablet Take 1 tablet (10 mg total) by mouth every 6 (six) hours as needed (Nausea or vomiting). 30 tablet 1  . sertraline (ZOLOFT) 50 MG tablet Take 1 tablet (50 mg total) by mouth daily. 30 tablet 6  .  Vitamin D, Ergocalciferol, (DRISDOL) 50000 UNITS CAPS capsule Take 1 capsule (50,000 Units total) by mouth every 7 (seven) days. 12 capsule 3   No current facility-administered medications for this visit.   Facility-Administered Medications  Ordered in Other Visits  Medication Dose Route Frequency Provider Last Rate Last Dose  . 0.9 %  sodium chloride infusion   Intravenous Once [name], MD      . heparin lock flush 100 unit/mL  500 Units Intracatheter Once PRN [name], MD      . ondansetron (ZOFRAN) 8 mg, dexamethasone (DECADRON) 10 mg in sodium chloride 0.9 % 50 mL IVPB   Intravenous Once [name], MD      . PACLitaxel-protein bound (ABRAXANE) chemo infusion 100 mg  55 mg/m2 (Treatment Plan Actual) Intravenous Once [name], MD      . sodium chloride 0.9 % injection 10 mL  10 mL Intracatheter PRN [name], MD   10 mL at 03/04/15 1638  . sodium chloride 0.9 % injection 10 mL  10 mL Intracatheter PRN [name], MD        PHYSICAL EXAMINATION: ECOG PERFORMANCE STATUS: 1 - Symptomatic but completely ambulatory  Filed Vitals:   05/06/15 1429  BP: 121/85  Pulse: 68  Temp: 99 F (37.2 C)  Resp: 18   Filed Weights   05/06/15 1429  Weight: 175 lb (79.379 kg)    GENERAL:alert, no distress and comfortable SKIN: skin color, texture, turgor are normal, no rashes or significant lesions EYES: normal, Conjunctiva are pink and non-injected, sclera clear OROPHARYNX:no exudate, no erythema and lips, buccal mucosa, and tongue normal  NECK: supple, thyroid normal size, non-tender, without nodularity LYMPH:  no palpable lymphadenopathy in the cervical, axillary or inguinal LUNGS: clear to auscultation and percussion with normal breathing effort HEART: regular rate & rhythm and no murmurs and no lower extremity edema ABDOMEN:abdomen soft, non-tender and normal bowel sounds Musculoskeletal:no cyanosis of digits and no clubbing  NEURO: alert & oriented x 3 with fluent speech, grade 1 peripheral neuropathy  LABORATORY DATA:  I have reviewed the data as listed   Chemistry      Component Value Date/Time   NA 138 05/06/2015 1418   K 3.6 05/06/2015 1418   CO2 23 05/06/2015 1418   BUN 9.7 05/06/2015 1418    CREATININE 0.7 05/06/2015 1418      Component Value Date/Time   CALCIUM 9.2 05/06/2015 1418   ALKPHOS 75 05/06/2015 1418   AST 17 05/06/2015 1418   ALT 16 05/06/2015 1418   BILITOT 0.35 05/06/2015 1418       Lab Results  Component Value Date   WBC 4.7 05/06/2015   HGB 11.8 05/06/2015   HCT 35.9 05/06/2015   MCV 94.7 05/06/2015   PLT 238 05/06/2015   NEUTROABS 3.2 05/06/2015   ASSESSMENT & PLAN:  Breast cancer of lower-outer quadrant of right female breast Right breast biopsy 12/03/2014 8:00: Invasive ductal carcinoma, grade 3, ER 0%, PR 0%, 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 Treatment plan: 1. Neo-adjuvant chemotherapy with dose dense Adriamycin and Cytoxan 4 followed by Abraxane once a week 12 ( we wanted to give her carboplatin and Abraxane but insurance did not authorize the treatment) 2. Followed by repeat breast MRI followed by surgery 3. Followed by radiation -------------------------------------------------------------------------------------------------------------------------------------------------- Current treatment: Completed 4 cycles of AC; Today  is cycle 12/12 Abraxane (Carboplatin was given with cycle 1 but insurance did not authorize it , so we discontinued it further)   Chemotherapy toxicities: 1. Constipation: resolved 2. Fatigue related to chemotherapy day 3-5 3. Insomnia: Continue Ativan 2 tablets at bedtime. Renewed 1 mg daily 4. Anxiety:  5. Nausea and vomiting 1 6. 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  neuropathy stable 8. Leukopenia: Slowly decreasing with each treatment. With the reduced dosage of Abraxane, leukopenia is improved  Monitoring  closely for toxicities.  Plan: 1. Breast MRI 05/02/2015: Complete radiologic response 2. Surgery F/U 3. Tumor Board presentation.  Return to clinic after surgery for follow-up  No orders of the defined types were placed in this encounter.   The patient has a good understanding of the overall plan. she agrees with it. she will call with any problems that may develop before the next visit here.   Rulon Eisenmenger, MD 05/06/2015

## 2015-05-06 NOTE — Patient Instructions (Signed)
[place] Discharge Instructions for Patients Receiving Chemotherapy  Today you received the following chemotherapy agents: Abraxane.   To help prevent nausea and vomiting after your treatment, we encourage you to take your nausea medication: Compazine 10 mg every 6 hours as needed and Zofran 8mg  every 12 hours as needed.   If you develop nausea and vomiting that is not controlled by your nausea medication, call the clinic.   BELOW ARE SYMPTOMS THAT SHOULD BE REPORTED IMMEDIATELY:  *FEVER GREATER THAN 100.5 F  *CHILLS WITH OR WITHOUT FEVER  NAUSEA AND VOMITING THAT IS NOT CONTROLLED WITH YOUR NAUSEA MEDICATION  *UNUSUAL SHORTNESS OF BREATH  *UNUSUAL BRUISING OR BLEEDING  TENDERNESS IN MOUTH AND THROAT WITH OR WITHOUT PRESENCE OF ULCERS  *URINARY PROBLEMS  *BOWEL PROBLEMS  UNUSUAL RASH Items with * indicate a potential emergency and should be followed up as soon as possible.  Feel free to call the clinic you have any questions or concerns. The clinic phone number is [phone].  Please show the [place] at check-in to the Emergency Department and triage nurse.

## 2015-05-12 ENCOUNTER — Encounter: Payer: Self-pay | Admitting: *Deleted

## 2015-05-14 ENCOUNTER — Ambulatory Visit: Payer: BLUE CROSS/BLUE SHIELD | Admitting: Hematology and Oncology

## 2015-05-27 ENCOUNTER — Other Ambulatory Visit: Payer: Self-pay | Admitting: General Surgery

## 2015-05-27 DIAGNOSIS — C50511 Malignant neoplasm of lower-outer quadrant of right female breast: Secondary | ICD-10-CM

## 2015-06-02 ENCOUNTER — Other Ambulatory Visit: Payer: Self-pay

## 2015-06-02 ENCOUNTER — Telehealth: Payer: Self-pay | Admitting: Hematology and Oncology

## 2015-06-02 NOTE — Telephone Encounter (Signed)
Called patient. Made patient aware of upcoming appointment.       [facility]. °

## 2015-06-03 ENCOUNTER — Telehealth: Payer: Self-pay | Admitting: *Deleted

## 2015-06-03 NOTE — Telephone Encounter (Signed)
Patient called asking for FMLA extension for surgery.  Advised she call surgeon.  Reports our office initiated FMLA.    "Surgery is next Friday and I need Dr. Lindi Adie to extend my FMLA.  I need to get over this lumpectomy along with having bad symptoms with chemotherapy.  Currently on intermittent and need at least six weeks to get over this.  Please have Dr. Lindi Adie write letter, fax letter to me at [phone]"  Will notify provider of this request.

## 2015-06-05 NOTE — Telephone Encounter (Signed)
Letter completed.

## 2015-06-09 ENCOUNTER — Encounter (HOSPITAL_BASED_OUTPATIENT_CLINIC_OR_DEPARTMENT_OTHER): Payer: Self-pay | Admitting: *Deleted

## 2015-06-09 ENCOUNTER — Other Ambulatory Visit: Payer: Self-pay | Admitting: *Deleted

## 2015-06-09 ENCOUNTER — Telehealth: Payer: Self-pay | Admitting: *Deleted

## 2015-06-09 DIAGNOSIS — C50511 Malignant neoplasm of lower-outer quadrant of right female breast: Secondary | ICD-10-CM

## 2015-06-09 MED ORDER — GABAPENTIN 100 MG PO CAPS: ORAL_CAPSULE | ORAL | Status: DC

## 2015-06-09 NOTE — Telephone Encounter (Signed)
Patient called requesting extension of FMLA due to surgery on 1/20 and wanting prescription for Gabapentin. Prescription called to pharmacy, FMLA extension in process. Patient advised of this and she verbalized understanding.

## 2015-06-10 ENCOUNTER — Encounter (HOSPITAL_BASED_OUTPATIENT_CLINIC_OR_DEPARTMENT_OTHER)
Admission: RE | Admit: 2015-06-10 | Discharge: 2015-06-10 | Disposition: A | Discharge status: Home / Self Care | Payer: BLUE CROSS/BLUE SHIELD | Source: Ambulatory Visit | Attending: General Surgery | Admitting: General Surgery

## 2015-06-10 DIAGNOSIS — Z01812 Encounter for preprocedural laboratory examination: Secondary | ICD-10-CM | POA: Diagnosis not present

## 2015-06-10 DIAGNOSIS — C50911 Malignant neoplasm of unspecified site of right female breast: Secondary | ICD-10-CM | POA: Insufficient documentation

## 2015-06-10 LAB — BASIC METABOLIC PANEL
Anion gap: 8 (ref 5–15)
BUN: 9 mg/dL (ref 6–20)
CHLORIDE: 104 mmol/L (ref 101–111)
CO2: 28 mmol/L (ref 22–32)
CREATININE: 0.7 mg/dL (ref 0.44–1.00)
Calcium: 9.3 mg/dL (ref 8.9–10.3)
GLUCOSE: 104 mg/dL — AB (ref 65–99)
Potassium: 3.8 mmol/L (ref 3.5–5.1)
Sodium: 140 mmol/L (ref 135–145)

## 2015-06-11 ENCOUNTER — Other Ambulatory Visit: Payer: Self-pay | Admitting: General Surgery

## 2015-06-11 DIAGNOSIS — C50511 Malignant neoplasm of lower-outer quadrant of right female breast: Secondary | ICD-10-CM

## 2015-06-12 ENCOUNTER — Encounter: Payer: Self-pay | Admitting: Hematology and Oncology

## 2015-06-12 NOTE — Progress Notes (Signed)
I faxed [place] [phone] and mailed copy to patient for her records

## 2015-06-13 ENCOUNTER — Encounter (HOSPITAL_COMMUNITY): Payer: BLUE CROSS/BLUE SHIELD

## 2015-06-16 ENCOUNTER — Ambulatory Visit (HOSPITAL_BASED_OUTPATIENT_CLINIC_OR_DEPARTMENT_OTHER): Payer: BLUE CROSS/BLUE SHIELD | Admitting: Anesthesiology

## 2015-06-16 ENCOUNTER — Ambulatory Visit (HOSPITAL_BASED_OUTPATIENT_CLINIC_OR_DEPARTMENT_OTHER)
Admission: RE | Admit: 2015-06-16 | Discharge: 2015-06-16 | Disposition: A | Discharge status: Home / Self Care | Payer: BLUE CROSS/BLUE SHIELD | Source: Ambulatory Visit | Attending: General Surgery | Admitting: General Surgery

## 2015-06-16 ENCOUNTER — Encounter (HOSPITAL_BASED_OUTPATIENT_CLINIC_OR_DEPARTMENT_OTHER)
Admission: RE | Disposition: A | Discharge status: Home / Self Care | Payer: Self-pay | Source: Ambulatory Visit | Attending: General Surgery

## 2015-06-16 ENCOUNTER — Encounter (HOSPITAL_BASED_OUTPATIENT_CLINIC_OR_DEPARTMENT_OTHER): Payer: Self-pay | Admitting: *Deleted

## 2015-06-16 ENCOUNTER — Encounter (HOSPITAL_COMMUNITY)
Admission: RE | Admit: 2015-06-16 | Discharge: 2015-06-16 | Disposition: A | Discharge status: Home / Self Care | Payer: BLUE CROSS/BLUE SHIELD | Source: Ambulatory Visit | Attending: General Surgery | Admitting: General Surgery

## 2015-06-16 ENCOUNTER — Other Ambulatory Visit: Payer: Self-pay | Admitting: General Surgery

## 2015-06-16 ENCOUNTER — Ambulatory Visit
Admission: RE | Admit: 2015-06-16 | Discharge: 2015-06-16 | Disposition: A | Discharge status: Home / Self Care | Payer: BLUE CROSS/BLUE SHIELD | Source: Ambulatory Visit | Attending: General Surgery | Admitting: General Surgery

## 2015-06-16 DIAGNOSIS — Z6831 Body mass index (BMI) 31.0-31.9, adult: Secondary | ICD-10-CM | POA: Insufficient documentation

## 2015-06-16 DIAGNOSIS — C50511 Malignant neoplasm of lower-outer quadrant of right female breast: Secondary | ICD-10-CM

## 2015-06-16 DIAGNOSIS — Z9221 Personal history of antineoplastic chemotherapy: Secondary | ICD-10-CM | POA: Insufficient documentation

## 2015-06-16 DIAGNOSIS — C50911 Malignant neoplasm of unspecified site of right female breast: Secondary | ICD-10-CM | POA: Diagnosis present

## 2015-06-16 DIAGNOSIS — I10 Essential (primary) hypertension: Secondary | ICD-10-CM | POA: Insufficient documentation

## 2015-06-16 DIAGNOSIS — Z79899 Other long term (current) drug therapy: Secondary | ICD-10-CM | POA: Insufficient documentation

## 2015-06-16 DIAGNOSIS — Z452 Encounter for adjustment and management of vascular access device: Secondary | ICD-10-CM | POA: Insufficient documentation

## 2015-06-16 HISTORY — PX: PORT-A-CATH REMOVAL: SHX5289

## 2015-06-16 HISTORY — DX: Myoneural disorder, unspecified: G70.9

## 2015-06-16 HISTORY — PX: BREAST LUMPECTOMY WITH NEEDLE LOCALIZATION AND AXILLARY SENTINEL LYMPH NODE BX: SHX5760

## 2015-06-16 SURGERY — BREAST LUMPECTOMY WITH NEEDLE LOCALIZATION AND AXILLARY SENTINEL LYMPH NODE BX
Anesthesia: General | Site: Breast | Laterality: Right

## 2015-06-16 MED ORDER — MIDAZOLAM HCL 2 MG/2ML IJ SOLN
INTRAMUSCULAR | Status: AC
  Filled 2015-06-16: qty 2

## 2015-06-16 MED ORDER — LIDOCAINE HCL (CARDIAC) 20 MG/ML IV SOLN
INTRAVENOUS | Status: DC | PRN
  Administered 2015-06-16: 50 mg via INTRAVENOUS

## 2015-06-16 MED ORDER — CHLORHEXIDINE GLUCONATE 4 % EX LIQD: 1.0000 "application " | Freq: Once | CUTANEOUS | Status: DC

## 2015-06-16 MED ORDER — FENTANYL CITRATE (PF) 100 MCG/2ML IJ SOLN
INTRAMUSCULAR | Status: AC
Start: 2015-06-16 — End: 2015-06-16
  Filled 2015-06-16: qty 2

## 2015-06-16 MED ORDER — FENTANYL CITRATE (PF) 100 MCG/2ML IJ SOLN
INTRAMUSCULAR | Status: AC
  Filled 2015-06-16: qty 2

## 2015-06-16 MED ORDER — SCOPOLAMINE 1 MG/3DAYS TD PT72
MEDICATED_PATCH | TRANSDERMAL | Status: AC
  Filled 2015-06-16: qty 1

## 2015-06-16 MED ORDER — HYDROMORPHONE HCL 1 MG/ML IJ SOLN
0.2500 mg | INTRAMUSCULAR | Status: DC | PRN
  Administered 2015-06-16 (×2): 0.5 mg via INTRAVENOUS

## 2015-06-16 MED ORDER — SCOPOLAMINE 1 MG/3DAYS TD PT72
1.0000 | MEDICATED_PATCH | Freq: Once | TRANSDERMAL | Status: DC
Start: 2015-06-16 — End: 2015-06-16
  Administered 2015-06-16: 1.5 mg via TRANSDERMAL

## 2015-06-16 MED ORDER — DEXAMETHASONE SODIUM PHOSPHATE 4 MG/ML IJ SOLN
INTRAMUSCULAR | Status: DC | PRN
  Administered 2015-06-16: 10 mg via INTRAVENOUS

## 2015-06-16 MED ORDER — ONDANSETRON HCL 4 MG/2ML IJ SOLN
INTRAMUSCULAR | Status: DC | PRN
  Administered 2015-06-16: 4 mg via INTRAVENOUS

## 2015-06-16 MED ORDER — LIDOCAINE HCL (CARDIAC) 20 MG/ML IV SOLN
INTRAVENOUS | Status: AC
  Filled 2015-06-16: qty 5

## 2015-06-16 MED ORDER — TECHNETIUM TC 99M SULFUR COLLOID FILTERED
1.0000 | Freq: Once | INTRAVENOUS | Status: DC | PRN
Start: 2015-06-16 — End: 2015-06-22

## 2015-06-16 MED ORDER — OXYCODONE HCL 5 MG PO TABS
ORAL_TABLET | ORAL | Status: AC
  Filled 2015-06-16: qty 1

## 2015-06-16 MED ORDER — HYDROMORPHONE HCL 1 MG/ML IJ SOLN
INTRAMUSCULAR | Status: AC
  Filled 2015-06-16: qty 1

## 2015-06-16 MED ORDER — OXYCODONE HCL 5 MG/5ML PO SOLN: 5.0000 mg | Freq: Once | ORAL | Status: AC | PRN

## 2015-06-16 MED ORDER — DEXAMETHASONE SODIUM PHOSPHATE 10 MG/ML IJ SOLN
INTRAMUSCULAR | Status: AC
  Filled 2015-06-16: qty 1

## 2015-06-16 MED ORDER — PROPOFOL 10 MG/ML IV BOLUS
INTRAVENOUS | Status: AC
  Filled 2015-06-16: qty 20

## 2015-06-16 MED ORDER — BUPIVACAINE HCL (PF) 0.25 % IJ SOLN
INTRAMUSCULAR | Status: DC | PRN
  Administered 2015-06-16: 20 mL

## 2015-06-16 MED ORDER — ONDANSETRON HCL 4 MG/2ML IJ SOLN
INTRAMUSCULAR | Status: AC
  Filled 2015-06-16: qty 2

## 2015-06-16 MED ORDER — EPHEDRINE SULFATE 50 MG/ML IJ SOLN
INTRAMUSCULAR | Status: DC | PRN
  Administered 2015-06-16: 10 mg via INTRAVENOUS
  Administered 2015-06-16: 5 mg via INTRAVENOUS

## 2015-06-16 MED ORDER — LACTATED RINGERS IV SOLN
INTRAVENOUS | Status: DC
  Administered 2015-06-16 (×2): via INTRAVENOUS

## 2015-06-16 MED ORDER — PROPOFOL 10 MG/ML IV BOLUS
INTRAVENOUS | Status: DC | PRN
  Administered 2015-06-16: 200 mg via INTRAVENOUS

## 2015-06-16 MED ORDER — CHLORHEXIDINE GLUCONATE 4 % EX LIQD
1.0000 | Freq: Once | CUTANEOUS | Status: DC
Start: 2015-06-16 — End: 2015-06-16

## 2015-06-16 MED ORDER — MIDAZOLAM HCL 2 MG/2ML IJ SOLN
1.0000 mg | INTRAMUSCULAR | Status: DC | PRN
  Administered 2015-06-16 (×2): 2 mg via INTRAVENOUS

## 2015-06-16 MED ORDER — FENTANYL CITRATE (PF) 100 MCG/2ML IJ SOLN
50.0000 ug | INTRAMUSCULAR | Status: DC | PRN
  Administered 2015-06-16: 50 ug via INTRAVENOUS
  Administered 2015-06-16: 100 ug via INTRAVENOUS

## 2015-06-16 MED ORDER — OXYCODONE-ACETAMINOPHEN 5-325 MG PO TABS: 1.0000 | ORAL_TABLET | ORAL | Status: DC | PRN

## 2015-06-16 MED ORDER — BUPIVACAINE-EPINEPHRINE (PF) 0.5% -1:200000 IJ SOLN
INTRAMUSCULAR | Status: DC | PRN
  Administered 2015-06-16: 30 mL

## 2015-06-16 MED ORDER — OXYCODONE HCL 5 MG PO TABS
5.0000 mg | ORAL_TABLET | Freq: Once | ORAL | Status: AC | PRN
  Administered 2015-06-16: 5 mg via ORAL

## 2015-06-16 MED ORDER — CEFAZOLIN SODIUM-DEXTROSE 2-3 GM-% IV SOLR
2.0000 g | INTRAVENOUS | Status: AC
  Administered 2015-06-16: 2 g via INTRAVENOUS

## 2015-06-16 MED ORDER — CEFAZOLIN SODIUM-DEXTROSE 2-3 GM-% IV SOLR
INTRAVENOUS | Status: AC
  Filled 2015-06-16: qty 50

## 2015-06-16 MED ORDER — MEPERIDINE HCL 25 MG/ML IJ SOLN: 6.2500 mg | INTRAMUSCULAR | Status: DC | PRN

## 2015-06-16 SURGICAL SUPPLY — 50 items
APPLIER CLIP 11 MED OPEN (CLIP) ×3
BLADE SURG 15 STRL LF DISP TIS (BLADE) ×2 IMPLANT
BLADE SURG 15 STRL SS (BLADE) ×1
CANISTER SUCT 1200ML W/VALVE (MISCELLANEOUS) ×3 IMPLANT
CHLORAPREP W/TINT 26ML (MISCELLANEOUS) ×3 IMPLANT
CLIP APPLIE 11 MED OPEN (CLIP) ×2 IMPLANT
COVER BACK TABLE 60X90IN (DRAPES) ×3 IMPLANT
COVER MAYO STAND STRL (DRAPES) ×3 IMPLANT
COVER PROBE W GEL 5X96 (DRAPES) ×3 IMPLANT
DECANTER SPIKE VIAL GLASS SM (MISCELLANEOUS) ×3 IMPLANT
DEVICE DUBIN W/COMP PLATE 8390 (MISCELLANEOUS) ×3 IMPLANT
DRAPE LAPAROSCOPIC ABDOMINAL (DRAPES) ×3 IMPLANT
DRAPE LAPAROTOMY 100X72 PEDS (DRAPES) ×3 IMPLANT
DRAPE UTILITY XL STRL (DRAPES) ×3 IMPLANT
ELECT COATED BLADE 2.86 ST (ELECTRODE) ×3 IMPLANT
ELECT REM PT RETURN 9FT ADLT (ELECTROSURGICAL) ×3
ELECTRODE REM PT RTRN 9FT ADLT (ELECTROSURGICAL) ×2 IMPLANT
GLOVE BIO SURGEON STRL SZ7.5 (GLOVE) ×3 IMPLANT
GLOVE BIOGEL M STRL SZ7.5 (GLOVE) ×3 IMPLANT
GLOVE BIOGEL PI IND STRL 7.0 (GLOVE) ×2 IMPLANT
GLOVE BIOGEL PI IND STRL 8 (GLOVE) ×2 IMPLANT
GLOVE BIOGEL PI INDICATOR 7.0 (GLOVE) ×1
GLOVE BIOGEL PI INDICATOR 8 (GLOVE) ×1
GLOVE ECLIPSE 6.5 STRL STRAW (GLOVE) ×3 IMPLANT
GLOVE EXAM NITRILE MD LF STRL (GLOVE) ×3 IMPLANT
GOWN STRL REUS W/ TWL LRG LVL3 (GOWN DISPOSABLE) ×4 IMPLANT
GOWN STRL REUS W/ TWL XL LVL3 (GOWN DISPOSABLE) ×2 IMPLANT
GOWN STRL REUS W/TWL LRG LVL3 (GOWN DISPOSABLE) ×2
GOWN STRL REUS W/TWL XL LVL3 (GOWN DISPOSABLE) ×1
KIT MARKER MARGIN INK (KITS) ×3 IMPLANT
LIQUID BAND (GAUZE/BANDAGES/DRESSINGS) ×3 IMPLANT
NDL SAFETY ECLIPSE 18X1.5 (NEEDLE) IMPLANT
NEEDLE HYPO 18GX1.5 SHARP (NEEDLE)
NEEDLE HYPO 25X1 1.5 SAFETY (NEEDLE) ×3 IMPLANT
NS IRRIG 1000ML POUR BTL (IV SOLUTION) ×3 IMPLANT
PACK BASIN DAY SURGERY FS (CUSTOM PROCEDURE TRAY) ×3 IMPLANT
PENCIL BUTTON HOLSTER BLD 10FT (ELECTRODE) ×3 IMPLANT
SLEEVE SCD COMPRESS KNEE MED (MISCELLANEOUS) ×3 IMPLANT
SPONGE LAP 18X18 X RAY DECT (DISPOSABLE) ×3 IMPLANT
STAPLER VISISTAT 35W (STAPLE) IMPLANT
SUT MON AB 4-0 PC3 18 (SUTURE) ×6 IMPLANT
SUT SILK 3 0 PS 1 (SUTURE) IMPLANT
SUT VIC AB 3-0 SH 27 (SUTURE) ×1
SUT VIC AB 3-0 SH 27X BRD (SUTURE) ×2 IMPLANT
SUT VICRYL 3-0 CR8 SH (SUTURE) ×3 IMPLANT
SYR CONTROL 10ML LL (SYRINGE) ×3 IMPLANT
TOWEL OR 17X24 6PK STRL BLUE (TOWEL DISPOSABLE) ×3 IMPLANT
TOWEL OR NON WOVEN STRL DISP B (DISPOSABLE) ×3 IMPLANT
TUBE CONNECTING 20X1/4 (TUBING) ×3 IMPLANT
YANKAUER SUCT BULB TIP NO VENT (SUCTIONS) ×3 IMPLANT

## 2015-06-16 NOTE — Op Note (Signed)
06/16/2015  3:31 PM  PATIENT:  Kaitlyn Keith  50 y.o. female  PRE-OPERATIVE DIAGNOSIS:  RIGHT BREAST CANCER  POST-OPERATIVE DIAGNOSIS:  RIGHT BREAST CANCER  PROCEDURE:  Procedure(s): BREAST LUMPECTOMY WITH NEEDLE LOCALIZATION AND AXILLARY SENTINEL LYMPH NODE BX (Right) REMOVAL PORT-A-CATH (N/A)  SURGEON:  Surgeon(s) and Role:    * [name], MD - Primary  PHYSICIAN ASSISTANT:   ASSISTANTS: none   ANESTHESIA:   general  EBL:  Total I/O In: 1600 [I.V.:1600] Out: -   BLOOD ADMINISTERED:none  DRAINS: none   LOCAL MEDICATIONS USED:  MARCAINE     SPECIMEN:  Source of Specimen:  right breast tissue and sentinel node   DISPOSITION OF SPECIMEN:  PATHOLOGY  COUNTS:  YES  TOURNIQUET:  * No tourniquets in log *  DICTATION: .Dragon Dictation   After informed consent was obtained the patient was brought to the operating room and placed in the supine position on the operating table. After adequate induction of general anesthesia the patient's bilateral chest, breast, and axillary areas were prepped with ChloraPrep, allowed to dry, and draped in usual sterile manner. Earlier in the day the patient underwent injection of 1 mCi of technetium sulfur colloid in the subareolar position on the right. Also earlier in the day the patient underwent a wire localization procedure and the wire was entering the right breast in the lower outer quadrant and headed medially. Attention was first turned to the left chest wall. The area around the port was infiltrated with quarter percent Marcaine. A small incision was made through her old incision with a 15 blade knife. The incision was carried through the subcutaneous tissue sharply with the 15 blade knife until the port was identified. The capsule around the port was opened sharply with the 15 blade knife. The 2 anchoring stitches were removed. The port was then gently pushed out of its pocket and with gentle traction was removed from the patient  without difficulty. Pressure was held for several minutes until the area was completely hemostatic. The deep layer of the wound was then closed with interrupted 3-0 Vicryl stitches. The skin was then closed with interrupted 4-0 Monocryl subcuticular stitches. Attention was then turned to the right axilla. The neoprobe was sent to technetium and used to identify a hot spot in the right axilla. A small transversely oriented incision was made overlying the hot spot with a 15 blade knife. The incision was carried through the skin and subcutaneous tissue sharply with electrocautery until the axilla was entered. A [place] retractor was deployed. Using the neoprobe to direct blunt hemostat dissection I was able to identify a lymph node with increased radioactivity. The lymph node was excised sharply with the electrocautery and the lymphatics were controlled with clips. Ex vivo counts on this sentinel node were approximately 200. This was sent as sentinel node #1. There were no other hot or palpable lymph nodes identified in the right axilla. The area was infiltrated with quarter percent Marcaine. The deep layer of the wound was closed with interrupted 3-0 Vicryl stitches. The skin was then closed with a running 4-0 Monocryl subcuticular stitch. Attention was then turned to the right breast. A radial type incision was made in the lower outer quadrant of the right breast overlying the path of the wire. The incision was carried through the skin and subcutaneous tissue sharply with electrocautery. While palpating the path of the wire a circular portion of breast tissue was excised sharply around the wire. Once the specimen was  removed it was oriented with the appropriate paint colors. A specimen radiograph was obtained that showed the clip to be in the center of the specimen. The specimen was then sent to pathology for further evaluation. Hemostasis was achieved using the Bovie electrocautery. The wound was infiltrated with  quarter percent Marcaine and irrigated with saline. The deep layer of the wound was closed with layers of interrupted 3-0 Vicryl stitches. Skin was then closed with interrupted 4-0 Monocryl subcuticular stitches. Dermabond dressings were then applied to all the incisions. The patient tolerated the procedure well. At the end of the case all needle sponge and instrument counts were correct. The patient was then awakened and taken to recovery in stable condition.  PLAN OF CARE: Discharge to home after PACU  PATIENT DISPOSITION:  PACU - hemodynamically stable.   Delay start of Pharmacological VTE agent (>24hrs) due to surgical blood loss or risk of bleeding: not applicable

## 2015-06-16 NOTE — Progress Notes (Signed)
Assisted Dr. Crews with right, ultrasound guided, pectoralis block. Side rails up, monitors on throughout procedure. See vital signs in flow sheet. Tolerated Procedure well. 

## 2015-06-16 NOTE — Transfer of Care (Signed)
Immediate Anesthesia Transfer of Care Note  Patient: Kaitlyn Keith  Procedure(s) Performed: Procedure(s): BREAST LUMPECTOMY WITH NEEDLE LOCALIZATION AND AXILLARY SENTINEL LYMPH NODE BX (Right) REMOVAL PORT-A-CATH (N/A)  Patient Location: PACU  Anesthesia Type:General and GA combined with regional for post-op pain  Level of Consciousness: awake and alert   Airway & Oxygen Therapy: Patient Spontanous Breathing and Patient connected to face mask oxygen  Post-op Assessment: Report given to RN and Post -op Vital signs reviewed and stable  Post vital signs: Reviewed and stable  Last Vitals:  Filed Vitals:   06/16/15 1240 06/16/15 1245  BP: 158/102 134/90  Pulse: 64 57  Temp:    Resp: 22 21    Complications: No apparent anesthesia complications

## 2015-06-16 NOTE — Anesthesia Preprocedure Evaluation (Addendum)
Anesthesia Evaluation  Patient identified by MRN, date of birth, ID band Patient awake    Reviewed: Allergy & Precautions, NPO status , Patient's Chart, lab work & pertinent test results  Airway Mallampati: I  TM Distance: >3 FB Neck ROM: Full    Dental  (+) Teeth Intact, Dental Advisory Given   Pulmonary    breath sounds clear to auscultation       Cardiovascular hypertension, Pt. on medications  Rhythm:Regular Rate:Normal     Neuro/Psych    GI/Hepatic   Endo/Other  Morbid obesity  Renal/GU      Musculoskeletal   Abdominal   Peds  Hematology   Anesthesia Other Findings   Reproductive/Obstetrics                            Anesthesia Physical Anesthesia Plan  ASA: III  Anesthesia Plan: General   Post-op Pain Management: MAC Combined w/ Regional for Post-op pain   Induction:   Airway Management Planned: LMA  Additional Equipment:   Intra-op Plan:   Post-operative Plan: Extubation in OR  Informed Consent: I have reviewed the patients History and Physical, chart, labs and discussed the procedure including the risks, benefits and alternatives for the proposed anesthesia with the patient or authorized representative who has indicated his/her understanding and acceptance.   Dental advisory given  Plan Discussed with: CRNA, Anesthesiologist and Surgeon  Anesthesia Plan Comments:         Anesthesia Quick Evaluation

## 2015-06-16 NOTE — Progress Notes (Signed)
Radiology staff performed nuc med inj. Pt tol well with IV sedation (see MAR). Will proceed with nerve block.

## 2015-06-16 NOTE — Discharge Instructions (Signed)

## 2015-06-16 NOTE — H&P (Signed)
Kaitlyn Keith. [facility] [name]  Location: [facility] Surgery Patient #: [id number] DOB: November 04, 1965 Undefined / Language: Undefined / Race: Undefined Female   History of Present Illness  The patient is a 50 year old female who presents for a follow-up for Breast cancer. The patient is a 50 year old black female who originally presented with a 2.4 cm cancer in the lower outer right breast. She was a triple negative. She received neoadjuvant chemotherapy and has had a complete radiographic response. She is now ready to schedule her definitive surgery.   Allergies  No Known Drug Allergies01/07/2015  Medication History Xanax (1MG Tablet, Oral) Active. Lactulose (10GM/15ML Solution, Oral) Active. Lidocaine-Prilocaine (2.5-2.5% Kit, External) Active. Lisinopril-Hydrochlorothiazide (20-12.5MG Tablet, Oral) Active. Ativan (1MG Tablet, Oral) Active. Compazine (10MG Tablet, Oral) Active. Zoloft (50MG Tablet, Oral) Active. Vitamin D (50000U Tablet, Oral) Active. Medications Reconciled    Review of Systems General Present- Fatigue and Night Sweats. Not Present- Appetite Loss, Chills, Fever, Weight Gain and Weight Loss. HEENT Not Present- Earache, Hearing Loss, Hoarseness, Nose Bleed, Oral Ulcers, Ringing in the Ears, Seasonal Allergies, Sinus Pain, Sore Throat, Visual Disturbances, Wears glasses/contact lenses and Yellow Eyes. Respiratory Not Present- Bloody sputum, Chronic Cough, Difficulty Breathing, Snoring and Wheezing. Breast Not Present- Breast Mass, Breast Pain, Nipple Discharge and Skin Changes. Cardiovascular Not Present- Chest Pain, Difficulty Breathing Lying Down, Leg Cramps, Palpitations, Rapid Heart Rate, Shortness of Breath and Swelling of Extremities. Gastrointestinal Not Present- Abdominal Pain, Bloating, Bloody Stool, Change in Bowel Habits, Chronic diarrhea, Constipation, Difficulty Swallowing, Excessive gas, Gets full quickly at meals, Hemorrhoids, Indigestion, Nausea,  Rectal Pain and Vomiting. Musculoskeletal Present- Joint Pain, Joint Stiffness and Muscle Pain. Not Present- Back Pain, Muscle Weakness and Swelling of Extremities. Neurological Present- Numbness and Tingling. Not Present- Decreased Memory, Fainting, Headaches, Seizures, Tremor, Trouble walking and Weakness. Psychiatric Not Present- Anxiety, Bipolar, Change in Sleep Pattern, Depression, Fearful and Frequent crying. Endocrine Not Present- Cold Intolerance, Excessive Hunger, Hair Changes, Heat Intolerance, Hot flashes and New Diabetes. Hematology Not Present- Easy Bruising, Excessive bleeding, Gland problems, HIV and Persistent Infections.  Vitals  Weight: 179 lb Height: 66in Body Surface Area: 1.91 m Body Mass Index: 28.89 kg/m  Temp.: 98.68F(Oral)  Pulse: 59 (Regular)  BP: 128/78 (Standing, Left Arm, Standard)       Physical Exam  General Mental Status-Alert. General Appearance-Consistent with stated age. Hydration-Well hydrated. Voice-Normal.  Head and Neck Head-normocephalic, atraumatic with no lesions or palpable masses. Trachea-midline. Thyroid Gland Characteristics - normal size and consistency.  Eye Eyeball - Bilateral-Extraocular movements intact. Sclera/Conjunctiva - Bilateral-No scleral icterus.  Chest and Lung Exam Chest and lung exam reveals -quiet, even and easy respiratory effort with no use of accessory muscles and on auscultation, normal breath sounds, no adventitious sounds and normal vocal resonance. Inspection Chest Wall - Normal. Back - normal.  Breast Note: There is no palpable mass in either breast. There is no palpable axillary, supra collicular, or cervical lymphadenopathy. She has well-healed breast reduction scars.   Cardiovascular Cardiovascular examination reveals -normal heart sounds, regular rate and rhythm with no murmurs and normal pedal pulses bilaterally.  Abdomen Inspection Inspection of the abdomen  reveals - No Hernias. Skin - Scar - no surgical scars. Palpation/Percussion Palpation and Percussion of the abdomen reveal - Soft, Non Tender, No Rebound tenderness, No Rigidity (guarding) and No hepatosplenomegaly. Auscultation Auscultation of the abdomen reveals - Bowel sounds normal.  Neurologic Neurologic evaluation reveals -alert and oriented x 3 with no impairment of recent or remote memory. Mental Status-Normal.  Musculoskeletal Normal Exam - Left-Upper Extremity Strength Normal and Lower Extremity Strength Normal. Normal Exam - Right-Upper Extremity Strength Normal and Lower Extremity Strength Normal.  Lymphatic Head & Neck  General Head & Neck Lymphatics: Bilateral - Description - Normal. Axillary  General Axillary Region: Bilateral - Description - Normal. Tenderness - Non Tender. Femoral & Inguinal  Generalized Femoral & Inguinal Lymphatics: Bilateral - Description - Normal. Tenderness - Non Tender.    Assessment & Plan  PRIMARY CANCER OF LOWER OUTER QUADRANT OF RIGHT FEMALE BREAST (C50.511) Impression: The patient had a 2.4 cm cancer in the lower outer right breast. She was treated with neoadjuvant therapy and has had a complete radiographic response. She returns today to schedule surgery. I have discussed with her in detail the different options for treatment and at this point she favors breast conservation. I think this is a reasonable way to treat her cancer. She is also a good candidate for sentinel node mapping. I have discussed with her in detail the risks and benefits of the operation to do this as well as some of the technical aspects and she understands and wishes to proceed    Signed by Luella Cook, MD

## 2015-06-16 NOTE — Anesthesia Procedure Notes (Addendum)
Anesthesia Regional Block:  Pectoralis block  Pre-Anesthetic Checklist: ,, timeout performed, Correct Patient, Correct Site, Correct Laterality, Correct Procedure, Correct Position, site marked, Risks and benefits discussed,  Surgical consent,  Pre-op evaluation,  At surgeon's request and post-op pain management  Laterality: Right and Upper  Prep: chloraprep       Needles:  Injection technique: Single-shot  Needle Type: Echogenic Needle     Needle Length: 9cm 9 cm Needle Gauge: 21 and 21 G    Additional Needles:  Procedures: ultrasound guided (picture in chart) Pectoralis block Narrative:  Start time: 06/16/2015 11:53 AM End time: 06/16/2015 11:58 AM Injection made incrementally with aspirations every 5 mL.  Performed by: Personally  Anesthesiologist: CREWS, DAVID   Procedure Name: LMA Insertion Date/Time: 06/16/2015 2:00 PM Performed by: Lieutenant Diego Pre-anesthesia Checklist: Patient identified, Emergency Drugs available, Suction available and Patient being monitored Patient Re-evaluated:Patient Re-evaluated prior to inductionOxygen Delivery Method: Circle System Utilized Preoxygenation: Pre-oxygenation with 100% oxygen Intubation Type: IV induction Ventilation: Mask ventilation without difficulty LMA: LMA inserted LMA Size: 4.0 Number of attempts: 1 Airway Equipment and Method: Bite block Placement Confirmation: positive ETCO2 and breath sounds checked- equal and bilateral Tube secured with: Tape Dental Injury: Teeth and Oropharynx as per pre-operative assessment       Right PEC block image

## 2015-06-16 NOTE — Anesthesia Postprocedure Evaluation (Signed)
Anesthesia Post Note  Patient: Jalaina Grossen  Procedure(s) Performed: Procedure(s) (LRB): BREAST LUMPECTOMY WITH NEEDLE LOCALIZATION AND AXILLARY SENTINEL LYMPH NODE BX (Right) REMOVAL PORT-A-CATH (N/A)  Patient location during evaluation: PACU Anesthesia Type: General Level of consciousness: awake and alert Pain management: pain level controlled Vital Signs Assessment: post-procedure vital signs reviewed and stable Respiratory status: spontaneous breathing, nonlabored ventilation and respiratory function stable Cardiovascular status: blood pressure returned to baseline and stable Postop Assessment: no signs of nausea or vomiting Anesthetic complications: no    Last Vitals:  Filed Vitals:   06/16/15 1600 06/16/15 1615  BP: 142/90 137/93  Pulse: 63 61  Temp:    Resp: 15 16    Last Pain:  Filed Vitals:   06/16/15 1627  PainSc: 7                  Dhani Imel A

## 2015-06-16 NOTE — Interval H&P Note (Signed)
History and Physical Interval Note:  06/16/2015 1:38 PM  Kaitlyn Keith  has presented today for surgery, with the diagnosis of RIGHT BREAST CANCER  The various methods of treatment have been discussed with the patient and family. After consideration of risks, benefits and other options for treatment, the patient has consented to  Procedure(s): BREAST LUMPECTOMY WITH NEEDLE LOCALIZATION AND AXILLARY SENTINEL LYMPH NODE BX (Right) REMOVAL PORT-A-CATH (N/A) as a surgical intervention .  The patient's history has been reviewed, patient examined, no change in status, stable for surgery.  I have reviewed the patient's chart and labs.  Questions were answered to the patient's satisfaction.     TOTH III,PAUL S

## 2015-06-17 ENCOUNTER — Encounter (HOSPITAL_BASED_OUTPATIENT_CLINIC_OR_DEPARTMENT_OTHER): Payer: Self-pay | Admitting: General Surgery

## 2015-06-19 ENCOUNTER — Telehealth: Payer: Self-pay

## 2015-06-19 NOTE — Telephone Encounter (Signed)
Returned pt call: she is asking status on FMLA form.  See note below.  Writer attempted to contact pt - her phone is not accepting calls.

## 2015-06-19 NOTE — Telephone Encounter (Signed)
-----   Message from Mariam Dollar sent at 06/19/2015 12:22 PM EST ----- Regarding: RE: fmla Elvia Collum, See my note-it was mailed 06/12/15 so she should get his week and copy faxed. Thanks, Raquel ----- Message -----    From: Prentiss Bells, RN    Sent: 06/18/2015  12:38 PM      To: Mariam Dollar Subject: fmla                                           Pt is looking for FMLA form.  She is going out for 6 weeks for surgery.  Thanks Lashell Moffitt

## 2015-06-22 NOTE — Assessment & Plan Note (Signed)
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 Neoadj chemo 12/24/14- 04/29/15 AC x 4 foll by Abraxane X 12 Rt Lumpectomy: Path CR 0/2 LN  Treatment plan:  1. Adj XRT  RTC in 6 months

## 2015-06-23 ENCOUNTER — Other Ambulatory Visit (HOSPITAL_BASED_OUTPATIENT_CLINIC_OR_DEPARTMENT_OTHER): Payer: BLUE CROSS/BLUE SHIELD

## 2015-06-23 ENCOUNTER — Ambulatory Visit (HOSPITAL_BASED_OUTPATIENT_CLINIC_OR_DEPARTMENT_OTHER): Payer: BLUE CROSS/BLUE SHIELD | Admitting: Hematology and Oncology

## 2015-06-23 ENCOUNTER — Encounter: Payer: Self-pay | Admitting: Hematology and Oncology

## 2015-06-23 VITALS — BP 115/74 | HR 62 | Temp 97.7°F | Resp 18 | Ht 63.0 in | Wt 180.0 lb

## 2015-06-23 DIAGNOSIS — G620 Drug-induced polyneuropathy: Secondary | ICD-10-CM

## 2015-06-23 DIAGNOSIS — C50511 Malignant neoplasm of lower-outer quadrant of right female breast: Secondary | ICD-10-CM | POA: Diagnosis not present

## 2015-06-23 DIAGNOSIS — T451X5A Adverse effect of antineoplastic and immunosuppressive drugs, initial encounter: Secondary | ICD-10-CM

## 2015-06-23 DIAGNOSIS — Z171 Estrogen receptor negative status [ER-]: Secondary | ICD-10-CM

## 2015-06-23 LAB — COMPREHENSIVE METABOLIC PANEL
ALT: 13 U/L (ref 0–55)
AST: 13 U/L (ref 5–34)
Albumin: 3.4 g/dL — ABNORMAL LOW (ref 3.5–5.0)
Alkaline Phosphatase: 91 U/L (ref 40–150)
Anion Gap: 9 mEq/L (ref 3–11)
BUN: 14.9 mg/dL (ref 7.0–26.0)
CHLORIDE: 106 meq/L (ref 98–109)
CO2: 24 mEq/L (ref 22–29)
Calcium: 9 mg/dL (ref 8.4–10.4)
Creatinine: 0.8 mg/dL (ref 0.6–1.1)
EGFR: >90 mL/min/{1.73_m2} (ref >90)
GLUCOSE: 86 mg/dL (ref 70–140)
POTASSIUM: 3.9 meq/L (ref 3.5–5.1)
SODIUM: 139 meq/L (ref 136–145)
Total Bilirubin: 0.41 mg/dL (ref 0.20–1.20)
Total Protein: 7 g/dL (ref 6.4–8.3)

## 2015-06-23 LAB — CBC WITH DIFFERENTIAL/PLATELET
BASO%: 0.6 % (ref 0.0–2.0)
BASOS ABS: 0 10*3/uL (ref 0.0–0.1)
EOS%: 1.2 % (ref 0.0–7.0)
Eosinophils Absolute: 0.1 10*3/uL (ref 0.0–0.5)
HCT: 40.1 % (ref 34.8–46.6)
HGB: 13.1 g/dL (ref 11.6–15.9)
LYMPH%: 25 % (ref 14.0–49.7)
MCH: 28.6 pg (ref 25.1–34.0)
MCHC: 32.6 g/dL (ref 31.5–36.0)
MCV: 87.6 fL (ref 79.5–101.0)
MONO#: 0.7 10*3/uL (ref 0.1–0.9)
MONO%: 13.4 % (ref 0.0–14.0)
NEUT#: 3.1 10*3/uL (ref 1.5–6.5)
NEUT%: 59.8 % (ref 38.4–76.8)
Platelets: 189 10*3/uL (ref 145–400)
RBC: 4.57 10*6/uL (ref 3.70–5.45)
RDW: 13.6 % (ref 11.2–14.5)
WBC: 5.2 10*3/uL (ref 3.9–10.3)
lymph#: 1.3 10*3/uL (ref 0.9–3.3)

## 2015-06-23 NOTE — Progress Notes (Signed)
Patient Care Team: Secundino Ginger, [name], [name], [name], [name], [name], [name], NP as Nurse Practitioner (Nurse Practitioner)  DIAGNOSIS: Breast cancer of lower-outer quadrant of right female breast [facility])   Staging form: Breast, AJCC 7th Edition     Clinical stage from 12/11/2014: Stage IIA (T2, N0, M0) - Unsigned       Staging comments: Staged at breast conference on 7.20.16  SUMMARY OF ONCOLOGIC HISTORY:   Breast cancer of lower-outer quadrant of right female breast ([place])   12/03/2014 Mammogram Right breast mass 1.9 cm it o'clock position 8 cm depth from the nipple   12/03/2014 Initial Diagnosis Right breast biopsy 8:00: Invasive ductal carcinoma, grade 3, ER 0%, PR 0%, 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%, HER-2 negative ratio 1.43   12/10/2014 Breast MRI Right breast lower outer quadrant: 2.3 x 2.4 x 2.4 cm rim-enhancing mass abuts the pectoralis fascia but no enhancement of pectoralis muscle, second focus of artifact?'s second tissue marker clip, no lymph nodes   12/24/2014 - 04/29/2015 Neo-Adjuvant Chemotherapy Dose dense Adriamycin and Cytoxan 4 followed by weekly Abraxane 12   05/02/2015 Breast MRI complete radiologic response   06/16/2015 Surgery Left Lumpectomy: Complete path Response, 0/2 LN    CHIEF COMPLIANT: follow-up after recent lumpectomy  INTERVAL HISTORY: Kaitlyn Keith is a 50 year old with above-mentioned history of 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 She had a complete pathologic response. She is recovering very well from surgery except for discomfort under the arm.she is concerned about the risk of lymphedema.  REVIEW OF SYSTEMS:   Constitutional:  Denies fevers, chills or abnormal weight loss Eyes: Denies blurriness of vision Ears, nose, mouth, throat, and face: Denies mucositis or sore throat Respiratory: Denies cough, dyspnea or wheezes Cardiovascular: Denies palpitation, chest discomfort Gastrointestinal:  Denies nausea, heartburn or change in bowel habits Skin: Denies abnormal skin rashes Lymphatics: Denies new lymphadenopathy or easy bruising Neurological:Denies numbness, tingling or new weaknesses Behavioral/Psych: Mood is stable, no new changes  Extremities: No lower extremity edema Breast:  denies any pain or lumps or nodules in either breasts All other systems were reviewed with the patient and are negative.  I have reviewed the past medical history, past surgical history, social history and family history with the patient and they are unchanged from previous note.  ALLERGIES:  has No Known Allergies.  MEDICATIONS:  Current Outpatient Prescriptions  Medication Sig Dispense Refill  . ALPRAZolam (XANAX) 1 MG tablet Take 2 tablets (2 mg total) by mouth at bedtime. 60 tablet 1  . gabapentin (NEURONTIN) 100 MG capsule Take 100 mg TID for 7 days, then 200 mg TID for 7 days and then 300 mg TID 126 capsule 0  . lidocaine-prilocaine (EMLA) cream Apply to affected area once 30 g 3  . lisinopril-hydrochlorothiazide (PRINZIDE,ZESTORETIC) 20-12.5 MG tablet Take 1 tablet by mouth 2 (two) times daily. 60 tablet 6  . LORazepam (ATIVAN) 1 MG tablet TAKE ONE TABLET BY MOUTH EVERY 6 HOURS AS NEEDED FOR NAUSEA AND VOMITING 30 tablet 0  . oxyCODONE-acetaminophen (ROXICET) 5-325 MG tablet Take 1-2 tablets by mouth every 4 (four) hours as needed. 50 tablet 0  . prochlorperazine (COMPAZINE) 10 MG tablet Take 1 tablet (10 mg total) by mouth every 6 (six)  hours as needed (Nausea or vomiting). 30 tablet 1  . sertraline (ZOLOFT) 50 MG tablet Take 1 tablet (50 mg total) by mouth daily. 30 tablet 6  . Vitamin D, Ergocalciferol, (DRISDOL) 50000 UNITS  CAPS capsule Take 1 capsule (50,000 Units total) by mouth every 7 (seven) days. 12 capsule 3   No current facility-administered medications for this visit.   Facility-Administered Medications Ordered in Other Visits  Medication Dose Route Frequency Provider Last Rate Last Dose  . sodium chloride 0.9 % injection 10 mL  10 mL Intracatheter PRN [name], MD   10 mL at 03/04/15 1638    PHYSICAL EXAMINATION: ECOG PERFORMANCE STATUS: 1 - Symptomatic but completely ambulatory  Filed Vitals:   06/23/15 1407  BP: 115/74  Pulse: 62  Temp: 97.7 F (36.5 C)  Resp: 18   Filed Weights   06/23/15 1407  Weight: 180 lb (81.647 kg)    GENERAL:alert, no distress and comfortable SKIN: skin color, texture, turgor are normal, no rashes or significant lesions EYES: normal, Conjunctiva are pink and non-injected, sclera clear OROPHARYNX:no exudate, no erythema and lips, buccal mucosa, and tongue normal  NECK: supple, thyroid normal size, non-tender, without nodularity LYMPH:  no palpable lymphadenopathy in the cervical, axillary or inguinal LUNGS: clear to auscultation and percussion with normal breathing effort HEART: regular rate & rhythm and no murmurs and no lower extremity edema ABDOMEN:abdomen soft, non-tender and normal bowel sounds MUSCULOSKELETAL:no cyanosis of digits and no clubbing  NEURO: alert & oriented x 3 with fluent speech, no focal motor/sensory deficits EXTREMITIES: No lower extremity edema  LABORATORY DATA:  I have reviewed the data as listed   Chemistry      Component Value Date/Time   NA 139 06/23/2015 1352   NA 140 06/10/2015 1250   K 3.9 06/23/2015 1352   K 3.8 06/10/2015 1250   CL 104 06/10/2015 1250   CO2 24 06/23/2015 1352   CO2 28 06/10/2015 1250   BUN 14.9 06/23/2015 1352   BUN 9 06/10/2015 1250   CREATININE 0.8 06/23/2015 1352   CREATININE 0.70 06/10/2015 1250      Component Value Date/Time   CALCIUM 9.0 06/23/2015 1352   CALCIUM 9.3 06/10/2015 1250     ALKPHOS 91 06/23/2015 1352   AST 13 06/23/2015 1352   ALT 13 06/23/2015 1352   BILITOT 0.41 06/23/2015 1352       Lab Results  Component Value Date   WBC 5.2 06/23/2015   HGB 13.1 06/23/2015   HCT 40.1 06/23/2015   MCV 87.6 06/23/2015   PLT 189 06/23/2015   NEUTROABS 3.1 06/23/2015     ASSESSMENT & PLAN:  Breast cancer of lower-outer quadrant of right female breast Right breast biopsy 12/03/2014 8:00: Invasive ductal carcinoma, grade 3, ER 0%, PR 0%, 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 Neoadj chemo 12/24/14- 04/29/15 AC x 4 foll by Abraxane X 12 Rt Lumpectomy: Path CR 0/2 LN  Treatment plan:  1. 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 Patient is currently on Neurontin. Titrating upwards up to 300 mg dose.  RTC in 6 months   Orders Placed This Encounter  Procedures  . Ambulatory referral to Radiation Oncology    Referral Priority:  Routine    Referral Type:  Consultation  Referral Reason:  Specialty Services Required    Requested Specialty:  Radiation Oncology    Number of Visits Requested:  1   The patient has a good understanding of the overall plan. she agrees with it. she will call with any problems that may develop before the next visit here.   Rulon Eisenmenger, MD 06/23/2015

## 2015-06-24 ENCOUNTER — Telehealth: Payer: Self-pay | Admitting: Hematology and Oncology

## 2015-06-24 NOTE — Telephone Encounter (Signed)
Called patient with appointment and she has no voicemail on her phone,placed a message on 2/1 rad/onc for her to get a new schedule

## 2015-06-25 ENCOUNTER — Telehealth: Payer: Self-pay | Admitting: Oncology

## 2015-06-25 ENCOUNTER — Ambulatory Visit
Admission: RE | Admit: 2015-06-25 | Discharge: 2015-06-25 | Disposition: A | Discharge status: Home / Self Care | Payer: BLUE CROSS/BLUE SHIELD | Source: Ambulatory Visit | Attending: Radiation Oncology | Admitting: Radiation Oncology

## 2015-06-25 DIAGNOSIS — Z510 Encounter for antineoplastic radiation therapy: Secondary | ICD-10-CM | POA: Insufficient documentation

## 2015-06-25 DIAGNOSIS — Z171 Estrogen receptor negative status [ER-]: Secondary | ICD-10-CM | POA: Insufficient documentation

## 2015-06-25 DIAGNOSIS — C50511 Malignant neoplasm of lower-outer quadrant of right female breast: Secondary | ICD-10-CM | POA: Insufficient documentation

## 2015-06-25 NOTE — Telephone Encounter (Signed)
Called [place] about her consult with Dr. Sondra Come today.  Kota said she was not aware of the appointment today and thought it was on 2/14.  Apologized for the confusion and transferred her to Earleen Newport, Medical Secretary to reschedule.

## 2015-06-26 ENCOUNTER — Encounter: Payer: Self-pay | Admitting: Radiation Oncology

## 2015-06-26 ENCOUNTER — Ambulatory Visit
Admission: RE | Admit: 2015-06-26 | Discharge: 2015-06-26 | Disposition: A | Discharge status: Home / Self Care | Payer: BLUE CROSS/BLUE SHIELD | Source: Ambulatory Visit | Attending: Radiation Oncology | Admitting: Radiation Oncology

## 2015-06-26 VITALS — BP 152/92 | HR 57 | Temp 98.4°F | Resp 16 | Ht 63.0 in | Wt 178.7 lb

## 2015-06-26 DIAGNOSIS — C50511 Malignant neoplasm of lower-outer quadrant of right female breast: Secondary | ICD-10-CM | POA: Diagnosis present

## 2015-06-26 DIAGNOSIS — Z171 Estrogen receptor negative status [ER-]: Secondary | ICD-10-CM | POA: Diagnosis not present

## 2015-06-26 DIAGNOSIS — Z510 Encounter for antineoplastic radiation therapy: Secondary | ICD-10-CM | POA: Diagnosis present

## 2015-06-26 NOTE — Progress Notes (Signed)
Location of Breast Cancer:Breast cancer of lower-outer quadrant of right female breast   Histology per Pathology Report:   06/16/15 Diagnosis 1. Lymph node, sentinel, biopsy, Right axillary - ONE LYMPH NODE, NEGATIVE FOR TUMOR (0/1). 2. Lymph node, sentinel, biopsy, Right axillary #2 - ONE LYMPH NODE, NEGATIVE FOR TUMOR (0/1). 3. Breast, lumpectomy, Right breast - NEGATIVE FOR IN SITU AND INVASIVE CARCINOMA, SEE COMMENT,. - PREVIOUS BIOPSY SITE PRESENT. - SURGICAL MARGINS, NEGATIVE FOR ATYPIA OR MALIGNANCY. - SEE TUMOR SYNOPTIC TEMPLATE BELOW.  8/41/[birth year]  Receptor Status: ER(0%), PR (0%), Her2-neu (negative)  Did patient present with symptoms (if so, please note symptoms) or was this found on screening mammography?:felt a lump in the right breast and then had her first ever mammogram.   Past/Anticipated interventions by surgeon, if any: 06/16/15 - Procedure: BREAST LUMPECTOMY WITH NEEDLE LOCALIZATION AND AXILLARY SENTINEL LYMPH NODE BX; Surgeon: [name], MD; Location: [place]; Service: General; Laterality: Right;  Past/Anticipated interventions by medical oncology, if any:   12/24/2014 - 04/29/2015 Neo-Adjuvant Chemotherapy Dose dense Adriamycin and Cytoxan 4 followed by weekly Abraxane 12       Lymphedema issues, if any:no  Pain issues, if any: yes reports aching pain in her right side that radiates to the front.  She said it started when she started 2 months and is worse after exercise.  OB GYN history: She menarched at early age of 50 and is premenopausal. She had one pregnancy, her first child was born at age 46. She has not received birth control pills. She was never exposed to fertility medications or hormone replacement therapy. She has no family history of Breast/GYN/GI cancer  SAFETY ISSUES:  Prior radiation? no  Pacemaker/ICD? no  Possible current pregnancy?no  Is the patient on methotrexate? no  Current Complaints / other  details:   BP 152/92 mmHg  Pulse 57  Temp(Src) 98.4 F (36.9 C) (Oral)  Resp 16  Ht '5\' 3"'  (1.6 m)  Wt 178 lb 11.2 oz (81.058 kg)  BMI 31.66 kg/m2   Jacqulyn Liner, RN 06/25/2015,8:03 AM

## 2015-06-26 NOTE — Progress Notes (Signed)
Please see the Nurse Progress Note in the MD Initial Consult Encounter for this patient. 

## 2015-06-26 NOTE — Progress Notes (Signed)
Radiation Oncology         [phone] ________________________________  Name: Kaitlyn Keith MRN: [id number]  Date: 06/26/2015  DOB: 1966/05/01  Re-Evaluation Visit Note  CC: Isaias Cowman, Gemma Payor, MD    ICD-9-CM ICD-10-CM   1. Breast cancer of lower-outer quadrant of right female breast ([place]) 174.5 C50.511     Diagnosis:   Breast cancer of lower-outer quadrant of right female breast [facility])   Staging form: Breast, AJCC 7th Edition     Clinical stage from 12/11/2014: Stage IIA (T2, N0, M0)       Staging comments: Staged at breast conference on 7.20.16  Narrative:  The patient returns today for a re-evaluation.  She found a mass on a self-breast exam. This lead to her first mammogram, revealing a mass in the lower-outer quadrant of the right breast. She was originally seen in multidisciplinary clinic on 12/11/2014. 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 She received neo-adjuvant chemotherapy with Dr. Lindi Adie 12/24/2014-04/29/2015. She had an MRI 05/02/2015 revealing no residual mass in the right breast. She underwent lumpectomy and sentinel node biopsy on 06/16/2015, revealing negative for DCIS and invasive carcinoma with negative margins and 0/2 lymph nodes positive (cR).. The patient presents today to discuss proceeding with adjuvant radiation treatment to the right breast. She had a port-a-cath in place on the left side, which was removed during the lumpectomy. She denies edema in her right arm. 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 She is drinking B12 drinks along with taking gabapentin to alleviate the pain. She has time off from work and will only work part-time during the remainder of radiation.  ALLERGIES:  has No Known Allergies.  Meds: Current Outpatient Prescriptions  Medication Sig Dispense Refill  . ALPRAZolam (XANAX) 1 MG  tablet Take 2 tablets (2 mg total) by mouth at bedtime. 60 tablet 1  . gabapentin (NEURONTIN) 100 MG capsule Take 100 mg TID for 7 days, then 200 mg TID for 7 days and then 300 mg TID 126 capsule 0  . lisinopril-hydrochlorothiazide (PRINZIDE,ZESTORETIC) 20-12.5 MG tablet Take 1 tablet by mouth 2 (two) times daily. 60 tablet 6  . LORazepam (ATIVAN) 1 MG tablet TAKE ONE TABLET BY MOUTH EVERY 6 HOURS AS NEEDED FOR NAUSEA AND VOMITING 30 tablet 0  . oxyCODONE-acetaminophen (ROXICET) 5-325 MG tablet Take 1-2 tablets by mouth every 4 (four) hours as needed. 50 tablet 0  . sertraline (ZOLOFT) 50 MG tablet Take 1 tablet (50 mg total) by mouth daily. 30 tablet 6  . Vitamin D, Ergocalciferol, (DRISDOL) 50000 UNITS CAPS capsule Take 1 capsule (50,000 Units total) by mouth every 7 (seven) days. 12 capsule 3  . prochlorperazine (COMPAZINE) 10 MG tablet Take 1 tablet (10 mg total) by mouth every 6 (six) hours as needed (Nausea or vomiting). (Patient not taking: Reported on 06/26/2015) 30 tablet 1   No current facility-administered medications for this encounter.   Facility-Administered Medications Ordered in Other Encounters  Medication Dose Route Frequency Provider Last Rate Last Dose  . sodium chloride 0.9 % injection 10 mL  10 mL Intracatheter PRN [name], MD   10 mL at 03/04/15 1638    Physical Findings: The patient is in no acute distress. Patient is alert and oriented.  height is '5\' 3"'  (1.6 m) and weight is 178 lb 11.2 oz (81.058 kg). Her oral temperature is 98.4 F (  36.9 C). Her blood pressure is 152/92 and her pulse is 57. Her respiration is 16. No significant changes. The lungs are clear to auscultation. The heart has a regular rhythm and rate. No palpable subclavicular or axillary adenopathy.   Left breast has no palpable mass or nipple discharge. Small scar on upper chest from port-o-cath removal. Right breast shows a diagonal scar in the lower outer quadrant, healing well without signs of  drainage or infection. Small scar on the axillary region from sentinel node procedure, healing well without signs of drainage or infection.  Lab Findings: Lab Results  Component Value Date   WBC 5.2 06/23/2015   HGB 13.1 06/23/2015   HCT 40.1 06/23/2015   MCV 87.6 06/23/2015   PLT 189 06/23/2015    Radiographic Findings: Nm Sentinel Node Inj-no Rpt (breast)  06/16/2015  CLINICAL DATA: right breast cancer Sulfur colloid was injected intradermally by the nuclear medicine technologist for breast cancer sentinel node localization.   Mm Breast Surgical Specimen  06/16/2015  CLINICAL DATA:  Specimen radiograph status post right breast lumpectomy. EXAM: SPECIMEN RADIOGRAPH OF THE RIGHT BREAST COMPARISON:  Previous exam(s). FINDINGS: Status post excision of the right breast. The wire tip and biopsy marker clip are present and are marked for pathology. This was communicated with the OR at 3:15 p.m. on 06/16/2015. IMPRESSION: Specimen radiograph of the right breast. Electronically Signed   By: Ammie Ferrier M.D.   On: 06/16/2015 15:16   Mm Digital Diagnostic Unilat R  06/16/2015  CLINICAL DATA:  Mammogram of the right breast status post MRI guided wire localization. EXAM: DIAGNOSTIC RIGHT MAMMOGRAM POST MRI GUIDED WIRE LOCALIZATION. COMPARISON:  Previous exam(s). FINDINGS: Mammographic images were obtained following MRI guided wire localization. A small portion of the wire was visualized, however neither the biopsy marking clip nor the tip of the localizing wire can be visualized mammographically. Therefore, ultrasound will be performed to try to document the distance of the clip from the wire hook. IMPRESSION: 1. Nonvisualization of the biopsy marking clip or the foci of the localizing wire in the right breast. 2. Ultrasound was subsequently performed to document the clip position from the wire hook, and is dictated in a separate report. Final Assessment: Post Procedure Mammograms for Marker Placement  Electronically Signed   By: Ammie Ferrier M.D.   On: 06/16/2015 10:49   US Breast Ltd Uni Right Inc Axilla  06/16/2015  CLINICAL DATA:  Post MRI wire localization ultrasound. EXAM: ULTRASOUND OF THE RIGHT BREAST COMPARISON:  Previous exam(s). FINDINGS: Targeted ultrasound is performed to identify the relationship of the biopsy marking clip with the wire tip as this could not be confirmed by mammography a wing to the far posterior location. Ultrasound was performed showing a small hypoechoic mass in the right breast at 8 o'clock. A linear echogenic biopsy marking clip clip is seen along the superficial margin of the residual mass. The MRI guided localizing wire can be seen coursing just deep to the mass and clip. The wire o'clock is approximately 3.5 cm proximal from the clip. IMPRESSION: The localizing wire courses through the residual mass in the right breast at 8 o'clock, and immediately deep to the biopsy marking clip. The biopsy marking clip lies approximately 3.5 cm proximal from the wire hook. Electronically Signed   By: Ammie Ferrier M.D.   On: 06/16/2015 10:45   Mr Skip Estimable Breast Loc Dev   1st Lesion  Inc Mr Guide  06/16/2015  CLINICAL DATA:  MRI guided wire  localization for a right breast malignancy status post neoadjuvant chemotherapy. EXAM: MRI GUIDED WIRE LOCALIZATION OF THE RIGHT BREAST TECHNIQUE: Multiplanar, multisequence MR imaging of the the right breast breast was performed without administration of intravenous contrast. CONTRAST:  None. COMPARISON:  Previous exams. FINDINGS: I met with the patient, and we discussed the procedure of MRI wire localization, including risks, benefits, and alternatives. Specifically, we discussed the risks of infection, bleeding, and wire migration, and inadequate sampling. Informed, written consent was given. The usual time out protocol was performed immediately prior to the procedure. Using sterile technique, 1% Lidocaine, MRI guidance, and a 9 cm wire,  localization was performed of the biopsy marking clip at 8 o'clock in the right breast, posterior depth using a lateral approach. Follow-up 2-view mammogram was performed and dictated separately. IMPRESSION: MRI guided localization of a clip in the right breast at 8 o'clock. No apparent complications. Electronically Signed   By: Ammie Ferrier M.D.   On: 06/16/2015 10:39    Impression:  Clinical stage II a poorly differentiated carcinoma of the right breast. Patient has had an excellent response to her neoadjuvant chemotherapy with complete response. The patient is a good candidate for external radiation as part of breast conservation therapy.  Plan: I spoke to the patient today regarding her diagnosis and options for treatment. We discussed the role of radiation in decreasing local failures in patients who undergo lumpectomy. We discussed the process of simulation and the placement tattoos. We discussed 4-6 weeks of treatment as an outpatient. We discussed the possibility of asymptomatic lung damage. We discussed the low likelihood of secondary malignancies. We discussed the possible side effects including but not limited to skin redness, fatigue, permanent skin darkening, and breast swelling. We discussed the medication that can be taken if the side effects cause problems.  She has her planning appointment scheduled 07/15/2015.Marland Kitchen She has an appointment with Dr. Marlou Starks on 07/07/2015.  ____________________________________ -----------------------------------  Blair Promise, PhD, MD    This document serves as a record of services personally performed by [name], MD. It was created on his behalf by Lendon Collar, a trained medical scribe. The creation of this record is based on the scribe's personal observations and the provider's statements to them. This document has been checked and approved by the attending provider.

## 2015-07-07 ENCOUNTER — Other Ambulatory Visit: Payer: Self-pay | Admitting: Hematology and Oncology

## 2015-07-07 ENCOUNTER — Other Ambulatory Visit: Payer: Self-pay | Admitting: *Deleted

## 2015-07-07 DIAGNOSIS — C50511 Malignant neoplasm of lower-outer quadrant of right female breast: Secondary | ICD-10-CM

## 2015-07-07 MED ORDER — GABAPENTIN 400 MG PO CAPS: ORAL_CAPSULE | ORAL | Status: DC

## 2015-07-07 NOTE — Telephone Encounter (Signed)
Pt called with c/o neuropathy. Per Dr. Lindi Adie increase dose to 400Mg  TID. Pt notified.

## 2015-07-14 ENCOUNTER — Encounter: Payer: Self-pay | Admitting: *Deleted

## 2015-07-14 ENCOUNTER — Other Ambulatory Visit: Payer: Self-pay | Admitting: *Deleted

## 2015-07-14 DIAGNOSIS — C50511 Malignant neoplasm of lower-outer quadrant of right female breast: Secondary | ICD-10-CM

## 2015-07-14 NOTE — Progress Notes (Signed)
Pt with c/o peripheral neuropathy, pt recently increased gabapentin to 400mg  TID. Referral/oder placed for OT. Informed pt that she will receive a call regarding this appt. We will send a letter extending her FMLA to 08/18/15.

## 2015-07-15 ENCOUNTER — Ambulatory Visit
Admission: RE | Admit: 2015-07-15 | Discharge: 2015-07-15 | Disposition: A | Discharge status: Home / Self Care | Payer: BLUE CROSS/BLUE SHIELD | Source: Ambulatory Visit | Attending: Radiation Oncology | Admitting: Radiation Oncology

## 2015-07-15 DIAGNOSIS — C50511 Malignant neoplasm of lower-outer quadrant of right female breast: Secondary | ICD-10-CM

## 2015-07-15 DIAGNOSIS — Z510 Encounter for antineoplastic radiation therapy: Secondary | ICD-10-CM | POA: Diagnosis not present

## 2015-07-15 NOTE — Progress Notes (Signed)
  Radiation Oncology         [phone] ________________________________  Name: Kaitlyn Keith MRN: [id number]  Date: 07/15/2015  DOB: 02/08/1966  SIMULATION AND TREATMENT PLANNING NOTE   DIAGNOSIS: Breast cancer of lower-outer quadrant of right female breast [facility])   Staging form: Breast, AJCC 7th Edition     Clinical stage from 12/11/2014: Stage IIA (T2, N0, M0) - Unsigned       Staging comments: Staged at breast conference on 7.20.16  NARRATIVE:  The patient was brought to the [place].  Identity was confirmed.  All relevant records and images related to the planned course of therapy were reviewed.  The patient freely provided informed written consent to proceed with treatment after reviewing the details related to the planned course of therapy. The consent form was witnessed and verified by the simulation staff.  Then, the patient was set-up in a stable reproducible  supine position for radiation therapy.  CT images were obtained.  Surface markings were placed.  The CT images were loaded into the planning software.  Then the target and avoidance structures were contoured.  Treatment planning then occurred.  The radiation prescription was entered and confirmed.  Then, I designed and supervised the construction of a total of 3 medically necessary complex treatment devices.  I have requested : 3D Simulation  I have requested a DVH of the following structures: lumpectomy cavity, lungs, heart.  I have ordered:dose calc.  PLAN:  The patient will receive 50.4 Gy in 28 fractions followed by a boost to the lumpectomy cavity of 10 gray and a cumulative dose of 60.4 gray.  ________________________________  Blair Promise, PhD, MD  This document serves as a record of services personally performed by [name], MD. It was created on his behalf by Darcus Austin, a trained medical scribe. The creation of this record is based on the scribe's personal observations and the provider's  statements to them. This document has been checked and approved by the attending provider.

## 2015-07-16 ENCOUNTER — Encounter: Payer: Self-pay | Admitting: Occupational Therapy

## 2015-07-16 ENCOUNTER — Ambulatory Visit: Payer: BLUE CROSS/BLUE SHIELD | Attending: Hematology and Oncology | Admitting: Occupational Therapy

## 2015-07-16 DIAGNOSIS — M79644 Pain in right finger(s): Secondary | ICD-10-CM | POA: Insufficient documentation

## 2015-07-16 DIAGNOSIS — M79645 Pain in left finger(s): Secondary | ICD-10-CM | POA: Diagnosis present

## 2015-07-16 DIAGNOSIS — C801 Malignant (primary) neoplasm, unspecified: Secondary | ICD-10-CM | POA: Diagnosis present

## 2015-07-16 DIAGNOSIS — M6281 Muscle weakness (generalized): Secondary | ICD-10-CM | POA: Diagnosis present

## 2015-07-16 DIAGNOSIS — G63 Polyneuropathy in diseases classified elsewhere: Secondary | ICD-10-CM | POA: Insufficient documentation

## 2015-07-16 NOTE — Therapy (Signed)
[facility] [address] [place], [place], [address] Phone: [phone]   Fax:  [phone]  Occupational Therapy Evaluation  Patient Details  Name: Kaitlyn Keith MRN: [id number] Date of Birth: 30-Mar-1966 No Data Recorded  Encounter Date: 07/16/2015      OT End of Session - 07/16/15 1030    Visit Number 1   Number of Visits 5   Date for OT Re-Evaluation 08/13/15   Authorization Type [employer]   OT Start Time [phone]   OT Stop Time 1010   OT Time Calculation (min) 45 min   Activity Tolerance Patient tolerated treatment well      Past Medical History  Diagnosis Date  . Breast cancer of lower-outer quadrant of right female breast ([place]) 12/05/2014  . Breast cancer ([place])   . Hypertension   . Anxiety   . Depression   . Arthritis   . Hot flashes   . Back pain   . 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 Breast reduction surgery    . Portacath placement N/A 12/23/2014    Procedure: INSERTION PORT-A-CATH;  Surgeon: [name], MD;  Location: [facility];  Service: General;  Laterality: N/A;  . Breast lumpectomy with needle localization and axillary sentinel lymph node bx Right 06/16/2015    Procedure: BREAST LUMPECTOMY WITH NEEDLE LOCALIZATION AND AXILLARY SENTINEL LYMPH NODE BX;  Surgeon: [name], MD;  Location: [facility];  Service: General;  Laterality: Right;  . Port-a-cath removal N/A 06/16/2015    Procedure: REMOVAL PORT-A-CATH;  Surgeon: [name], MD;  Location: [facility];  Service: General;  Laterality: N/A;    There were no vitals filed for this visit.  Visit Diagnosis:  Neuropathy associated with cancer ([place]) - Plan: Ot plan of care cert/re-cert  Generalized muscle weakness - Plan: Ot plan of care cert/re-cert  Pain in finger of both hands - Plan: Ot plan of care  cert/re-cert      Subjective Assessment - 07/16/15 0931    Subjective  I work at an [employer] - typing is at least 90% of your job   Pertinent History breast CA November 23, 2014 (finished chemo 05/06/15), begins radiation 07/23/15   Patient Stated Goals I want my hands under control so I can go back to work   Currently in Pain? 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  No   Is the patient reluctant to leave their home because of a fear of falling?  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 Independent medication mngmt, financial mngmt, driving. Currently on FMLA at work   Mobility   Mobility Status Independent   Written Expression   Dominant Hand Right   Handwriting --  denies change   Vision - History   Baseline Vision No visual deficits   Additional Comments denies change   Cognition   Overall Cognitive Status Impaired/Different from baseline  but does not affect function - only mild memory deficits   Sensation   Light Touch Appears Intact  and localization intact   Hot/Cold --  hypersensitive to cold   Coordination   9 Hole Peg Test Right;Left   Right 9 Hole Peg Test 22.78 sec   Left 9 Hole Peg Test 26.38 sec   Edema    Edema none during evaluation, but pt reports she can have fluctuating edema   ROM / Strength   AROM / PROM / Strength AROM   AROM   Overall AROM Comments BUE AROM WNL's, however noted hyperextension in PIP joints of bilateral hands digits 2-5 (worse in Rt hand especially ring finger). Pt reports this began with the chemo   Hand Function   Right Hand Grip (lbs) 45 lbs   Right Hand Lateral Pinch 14 lbs   Right Hand 3 Point Pinch 8 lbs   Left Hand Grip (lbs) 38 lbs   Left Hand Lateral Pinch 13 lbs   Left 3 point pinch 6.5 lbs                              OT Long Term Goals - 07/16/15 1035    OT LONG TERM GOAL #1   Title Independent with HEP for bilateral hands (all LTG's due 08/13/15)   Time 4   Period Weeks   Status New   OT LONG TERM GOAL #2   Title Independent with HEP for BUE's   Time 4   Period Weeks   Status New   OT LONG TERM GOAL #3   Title Pt to verbalize understanding with task modifications, possible A/E needs, pain management, and desensitization techniques   Time 4   Period Weeks   Status New               Plan - 07/16/15 1031    Clinical Impression Statement Pt is a 50 y.o. female who presents to outpatient rehab with chemo induced peripheral neuropathy bilateral hands s/p chemo for stage 2 breast CA. Pt with reports of numbness/tinglings in hands (mostly fingertips) but overall does not limit ADL function.    Pt will benefit from skilled therapeutic intervention in order to improve on the following deficits (Retired) Decreased endurance;Increased edema;Impaired sensation;Decreased activity tolerance;Impaired UE functional use;Pain;Decreased strength   Rehab Potential Good   OT Frequency 1x / week   OT Duration 4 weeks  anticipate only 2 weeks (Pt did not initially wish to pursue O.T., but agreed to 2 visits for HEP, task modifications, and pain management strategies)   OT Treatment/Interventions Self-care/ADL training;Therapeutic  exercise;Patient/family education;Ultrasound;Neuromuscular education;Manual Therapy;Splinting;Therapeutic exercises;Parrafin;DME and/or AE instruction;Compression bandaging;Therapeutic activities;Electrical Stimulation;Fluidtherapy;Moist Heat;Contrast Bath;Passive range of motion   Plan HEP for bilateral hands (ROM and putty), HEP to maintain ROM BUE's d/t beginning radiation, pain management strategies, possibly heat modalities to hands ONLY if cleared by oncologist   Consulted and Agree with Plan of Care Patient  Problem List Patient Active Problem List   Diagnosis Date Noted  . Chemotherapy-induced peripheral neuropathy ([place]) 06/23/2015  . Paronychia of great toe, left 02/04/2015  . Breast cancer of lower-outer quadrant of right female breast ([place]) 12/05/2014    Carey Bullocks, OTR/L 07/16/2015, 10:40 AM  [facility] [address] [place], [place], [address] Phone: [phone]   Fax:  [phone]  Name: Persayis Wead MRN: [id number] Date of Birth: Dec 05, 1965

## 2015-07-16 NOTE — Progress Notes (Signed)
  Radiation Oncology         [phone]) [phone] ________________________________  Name: Kaitlyn Keith MRN: [id number]  Date: 07/15/2015  DOB: [birth year]  Optical Surface Tracking Plan:  Since intensity modulated radiotherapy (IMRT) and 3D conformal radiation treatment methods are predicated on accurate and precise positioning for treatment, intrafraction motion monitoring is medically necessary to ensure accurate and safe treatment delivery.  The ability to quantify intrafraction motion without excessive ionizing radiation dose can only be performed with optical surface tracking. Accordingly, surface imaging offers the opportunity to obtain 3D measurements of patient position throughout IMRT and 3D treatments without excessive radiation exposure.  I am ordering optical surface tracking for this patient's upcoming course of radiotherapy. ________________________________  [name], MD 07/16/2015 5:16 PM    Reference:   Particia Jasper, et al. Surface imaging-based analysis of intrafraction motion for breast radiotherapy patients.Journal of [place], n. 6, nov. 2014. ISSN [id number].   Available at: <http://www.jacmp.org/index.php/jacmp/article/view/4957>.

## 2015-07-17 DIAGNOSIS — Z510 Encounter for antineoplastic radiation therapy: Secondary | ICD-10-CM | POA: Diagnosis not present

## 2015-07-21 ENCOUNTER — Encounter: Payer: Self-pay | Admitting: Radiation Oncology

## 2015-07-21 ENCOUNTER — Telehealth: Payer: Self-pay | Admitting: Radiation Oncology

## 2015-07-21 NOTE — Progress Notes (Signed)
Scanned and emailed fmla form to [facility] for dr. Sondra Come

## 2015-07-21 NOTE — Progress Notes (Signed)
PAPERWORK ([employer]) RECEIVED THROUGH EMAIL, 07/21/15, GIVEN TO RN (JE)

## 2015-07-22 ENCOUNTER — Ambulatory Visit
Admission: RE | Admit: 2015-07-22 | Discharge: 2015-07-22 | Disposition: A | Discharge status: Home / Self Care | Payer: BLUE CROSS/BLUE SHIELD | Source: Ambulatory Visit | Attending: Radiation Oncology | Admitting: Radiation Oncology

## 2015-07-22 DIAGNOSIS — Z510 Encounter for antineoplastic radiation therapy: Secondary | ICD-10-CM | POA: Diagnosis not present

## 2015-07-22 DIAGNOSIS — C50511 Malignant neoplasm of lower-outer quadrant of right female breast: Secondary | ICD-10-CM

## 2015-07-22 NOTE — Progress Notes (Signed)
  Radiation Oncology         [phone] ________________________________  Name: Kaitlyn Keith MRN: [id number]  Date: 07/22/2015  DOB: 1966/02/25  Simulation Verification Note    ICD-9-CM ICD-10-CM   1. Breast cancer of lower-outer quadrant of right female breast ([place]) 174.5 C50.511     Status: outpatient  NARRATIVE: The patient was brought to the treatment unit and placed in the planned treatment position. The clinical setup was verified. Then port films were obtained and uploaded to the radiation oncology medical record software.  The treatment beams were carefully compared against the planned radiation fields. The position location and shape of the radiation fields was reviewed. They targeted volume of tissue appears to be appropriately covered by the radiation beams. Organs at risk appear to be excluded as planned.  Based on my personal review, I approved the simulation verification. The patient's treatment will proceed as planned.  -----------------------------------  Blair Promise, PhD, MD

## 2015-07-22 NOTE — Telephone Encounter (Signed)
Opened by mistake.

## 2015-07-23 ENCOUNTER — Ambulatory Visit
Admission: RE | Admit: 2015-07-23 | Discharge: 2015-07-23 | Disposition: A | Discharge status: Home / Self Care | Payer: BLUE CROSS/BLUE SHIELD | Source: Ambulatory Visit | Attending: Radiation Oncology | Admitting: Radiation Oncology

## 2015-07-23 DIAGNOSIS — Z510 Encounter for antineoplastic radiation therapy: Secondary | ICD-10-CM | POA: Diagnosis not present

## 2015-07-24 ENCOUNTER — Ambulatory Visit
Admission: RE | Admit: 2015-07-24 | Discharge: 2015-07-24 | Disposition: A | Discharge status: Home / Self Care | Payer: BLUE CROSS/BLUE SHIELD | Source: Ambulatory Visit | Attending: Radiation Oncology | Admitting: Radiation Oncology

## 2015-07-24 ENCOUNTER — Encounter: Payer: Self-pay | Admitting: Radiation Oncology

## 2015-07-24 DIAGNOSIS — Z510 Encounter for antineoplastic radiation therapy: Secondary | ICD-10-CM | POA: Diagnosis not present

## 2015-07-24 DIAGNOSIS — C50511 Malignant neoplasm of lower-outer quadrant of right female breast: Secondary | ICD-10-CM

## 2015-07-24 MED ORDER — RADIAPLEXRX EX GEL
Freq: Once | CUTANEOUS | Status: AC
  Administered 2015-07-24: 16:00:00 via TOPICAL

## 2015-07-24 MED ORDER — ALRA NON-METALLIC DEODORANT (RAD-ONC)
1.0000 "application " | Freq: Once | TOPICAL | Status: AC
  Administered 2015-07-24: 1 via TOPICAL

## 2015-07-24 NOTE — Progress Notes (Signed)
Got FMLA paperwork from [employer] and gave to [employer].  Per [facility] paperwork should be filled out by [facility] for this patient.

## 2015-07-24 NOTE — Progress Notes (Signed)
Pt here for patient teaching.  Pt given Radiation and You booklet, skin care instructions, Alra deodorant and Radiaplex gel. Pt reports they have not watched the Radiation Therapy Education video and has been given the link to watch at home. Reviewed areas of pertinence such as fatigue, skin changes, breast tenderness and breast swelling . Pt able to give teach back of to pat skin and use unscented/gentle soap,apply Radiaplex bid and avoid applying anything to skin within 4 hours of treatment. Pt demonstrated understanding and verbalizes understanding of information given and will contact nursing with any questions or concerns.     Http://rtanswers.org/treatmentinformation/whattoexpect/index      

## 2015-07-25 ENCOUNTER — Ambulatory Visit
Admission: RE | Admit: 2015-07-25 | Discharge: 2015-07-25 | Disposition: A | Discharge status: Home / Self Care | Payer: BLUE CROSS/BLUE SHIELD | Source: Ambulatory Visit | Attending: Radiation Oncology | Admitting: Radiation Oncology

## 2015-07-25 ENCOUNTER — Encounter: Payer: Self-pay | Admitting: Radiation Oncology

## 2015-07-25 DIAGNOSIS — Z510 Encounter for antineoplastic radiation therapy: Secondary | ICD-10-CM | POA: Diagnosis not present

## 2015-07-25 NOTE — Progress Notes (Signed)
PAPERWORK RECEIVED, GIVEN TO NURSE 3/3 (JE)

## 2015-07-28 ENCOUNTER — Ambulatory Visit
Admission: RE | Admit: 2015-07-28 | Discharge: 2015-07-28 | Disposition: A | Discharge status: Home / Self Care | Payer: BLUE CROSS/BLUE SHIELD | Source: Ambulatory Visit | Attending: Radiation Oncology | Admitting: Radiation Oncology

## 2015-07-28 DIAGNOSIS — Z510 Encounter for antineoplastic radiation therapy: Secondary | ICD-10-CM | POA: Diagnosis not present

## 2015-07-29 ENCOUNTER — Ambulatory Visit
Admission: RE | Admit: 2015-07-29 | Discharge: 2015-07-29 | Disposition: A | Discharge status: Home / Self Care | Payer: BLUE CROSS/BLUE SHIELD | Source: Ambulatory Visit | Attending: Radiation Oncology | Admitting: Radiation Oncology

## 2015-07-29 ENCOUNTER — Encounter: Payer: Self-pay | Admitting: Radiation Oncology

## 2015-07-29 VITALS — BP 136/90 | HR 75 | Temp 97.9°F | Ht 63.0 in | Wt 178.3 lb

## 2015-07-29 DIAGNOSIS — Z510 Encounter for antineoplastic radiation therapy: Secondary | ICD-10-CM | POA: Diagnosis not present

## 2015-07-29 DIAGNOSIS — C50511 Malignant neoplasm of lower-outer quadrant of right female breast: Secondary | ICD-10-CM

## 2015-07-29 NOTE — Progress Notes (Addendum)
Kaitlyn Keith has completed 5 fractions to her right breast.  She reports pain due to neuropathy in her hands.  She also reports feeling "a wave" the went across her right breast during radiation on Friday.  She reports having some tenderness in her right breast.  The skin on her right breast is intact.  She is using radiaplex.  BP 136/90 mmHg  Pulse 75  Temp(Src) 97.9 F (36.6 C) (Oral)  Ht 5\' 3"  (1.6 m)  Wt 178 lb 4.8 oz (80.876 kg)  BMI 31.59 kg/m2

## 2015-07-29 NOTE — Progress Notes (Signed)
  Radiation Oncology         [phone] ________________________________  Name: Kaitlyn Keith MRN: [id number]  Date: 07/29/2015  DOB: 04-Aug-1965  Weekly Radiation Therapy Management    ICD-9-CM ICD-10-CM   1. Breast cancer of lower-outer quadrant of right female breast (HCC) 174.5 C50.511      Current Dose: 9 Gy     Planned Dose:  60.4 Gy  Narrative . . . . . . . . The patient presents for routine under treatment assessment.                                 Kaitlyn Keith has completed 5 fractions to her right breast. She reports pain due to neuropathy in her hands. She also reports feeling "a wave" the went across her right breast during radiation on Friday. She reports having some tenderness in her right breast. The skin on her right breast is intact. She is using radiaplex.                                   Set-up films were reviewed.                                 The chart was checked. Physical Findings. . .  height is 5\' 3"  (1.6 m) and weight is 178 lb 4.8 oz (80.876 kg). Her oral temperature is 97.9 F (36.6 C). Her blood pressure is 136/90 and her pulse is 75. . The lungs are clear. The heart has a regular rhythm and rate. The right breast area shows some mild hyperpigmentation changes. Impression . . . . . . . The patient is tolerating radiation. Plan . . . . . . . . . . . . Continue treatment as planned.  ________________________________   Blair Promise, PhD, MD

## 2015-07-30 ENCOUNTER — Ambulatory Visit
Admission: RE | Admit: 2015-07-30 | Discharge: 2015-07-30 | Disposition: A | Discharge status: Home / Self Care | Payer: BLUE CROSS/BLUE SHIELD | Source: Ambulatory Visit | Attending: Radiation Oncology | Admitting: Radiation Oncology

## 2015-07-30 ENCOUNTER — Ambulatory Visit: Payer: BLUE CROSS/BLUE SHIELD | Admitting: Occupational Therapy

## 2015-07-30 DIAGNOSIS — Z510 Encounter for antineoplastic radiation therapy: Secondary | ICD-10-CM | POA: Diagnosis not present

## 2015-07-31 ENCOUNTER — Ambulatory Visit
Admission: RE | Admit: 2015-07-31 | Discharge: 2015-07-31 | Disposition: A | Discharge status: Home / Self Care | Payer: BLUE CROSS/BLUE SHIELD | Source: Ambulatory Visit | Attending: Radiation Oncology | Admitting: Radiation Oncology

## 2015-07-31 DIAGNOSIS — Z510 Encounter for antineoplastic radiation therapy: Secondary | ICD-10-CM | POA: Diagnosis not present

## 2015-08-01 ENCOUNTER — Encounter: Payer: Self-pay | Admitting: *Deleted

## 2015-08-01 ENCOUNTER — Ambulatory Visit
Admission: RE | Admit: 2015-08-01 | Discharge: 2015-08-01 | Disposition: A | Discharge status: Home / Self Care | Payer: BLUE CROSS/BLUE SHIELD | Source: Ambulatory Visit | Attending: Radiation Oncology | Admitting: Radiation Oncology

## 2015-08-01 DIAGNOSIS — Z510 Encounter for antineoplastic radiation therapy: Secondary | ICD-10-CM | POA: Diagnosis not present

## 2015-08-01 NOTE — Progress Notes (Signed)
[place] Work  Clinical Social Work was referred by patient navigator for assessment of psychosocial needs.  Clinical Social Worker contacted patient via phone to offer support and assess for needs. Pt having concerns with finances. CSW reviewed possible assistance options and printed off resources/applications and provided for pt. Pt plans to meet with CSW on Monday, March 13 to further review and discuss possible concerns.    Clinical Social Work interventions: Resource education and assistance  Loren Racer, [place] Worker [place]  [place] Phone: [phone] Fax: [phone]

## 2015-08-04 ENCOUNTER — Ambulatory Visit
Admission: RE | Admit: 2015-08-04 | Discharge: 2015-08-04 | Disposition: A | Discharge status: Home / Self Care | Payer: BLUE CROSS/BLUE SHIELD | Source: Ambulatory Visit | Attending: Radiation Oncology | Admitting: Radiation Oncology

## 2015-08-04 ENCOUNTER — Encounter: Payer: Self-pay | Admitting: *Deleted

## 2015-08-04 ENCOUNTER — Telehealth: Payer: Self-pay | Admitting: *Deleted

## 2015-08-04 DIAGNOSIS — Z510 Encounter for antineoplastic radiation therapy: Secondary | ICD-10-CM | POA: Diagnosis not present

## 2015-08-04 MED ORDER — OXYCODONE-ACETAMINOPHEN 5-325 MG PO TABS: 1.0000 | ORAL_TABLET | Freq: Four times a day (QID) | ORAL | Status: DC | PRN

## 2015-08-04 NOTE — Progress Notes (Signed)
[place] Work  Clinical Social Work was referred by Development worker, community for assessment of psychosocial needs due to financial concerns.  Clinical Social Worker met with patient at [facility] in [place] office to offer support and assess for needs.  Pt reports to work at [employer] and has good support from her sisters. She has a 48yo son who is a Equities trader in high school. CSW reviewed resources to assist and pt appears to make too much to qualify for [facility] in [place]. We reviewed applications for [employer] and [employer] and pt appears to meet those qualifications. CSW and pt to work on applications and submit. Pt open to additional support programs and pt enrolled in [facility] today. CSW and pt to meet later this week to finish applications.   Clinical Social Work interventions: Supportive listening Resource assistance  Loren Racer, [place] Worker [place]  [place] Phone: [phone] Fax: [phone]

## 2015-08-04 NOTE — Telephone Encounter (Signed)
Spoke to pt concerning needs during xrt. Relate doing well. Pt still having difficulty with bone pain and neuropathy.  Denies needs at this time. Encourage pt to call with questions or concerns. Received verbal understanding.

## 2015-08-05 ENCOUNTER — Encounter: Payer: Self-pay | Admitting: Radiation Oncology

## 2015-08-05 ENCOUNTER — Ambulatory Visit
Admission: RE | Admit: 2015-08-05 | Discharge: 2015-08-05 | Disposition: A | Discharge status: Home / Self Care | Payer: BLUE CROSS/BLUE SHIELD | Source: Ambulatory Visit | Attending: Radiation Oncology | Admitting: Radiation Oncology

## 2015-08-05 VITALS — BP 147/106 | HR 74 | Temp 98.0°F | Ht 63.0 in | Wt 180.6 lb

## 2015-08-05 DIAGNOSIS — C50511 Malignant neoplasm of lower-outer quadrant of right female breast: Secondary | ICD-10-CM

## 2015-08-05 DIAGNOSIS — Z510 Encounter for antineoplastic radiation therapy: Secondary | ICD-10-CM | POA: Diagnosis not present

## 2015-08-05 NOTE — Progress Notes (Signed)
[name] is here for her 10th fraction of radiation to her Right Breast. She reports some fatigue, in the afternoon she begins to feels tired. She reports increased hot flashes since starting radiation. She reports some hyperpigmentation to her Right Breast. She is using the Radiaplex cream as directed. She reports some anxiety at this time related to starting work again on Monday.   BP 147/106 mmHg  Pulse 74  Temp(Src) 98 F (36.7 C)  Ht 5\' 3"  (1.6 m)  Wt 180 lb 9.6 oz (81.92 kg)  BMI 32.00 kg/m2

## 2015-08-05 NOTE — Progress Notes (Signed)
  Radiation Oncology         [phone] ________________________________  Name: Kaitlyn Keith MRN: [id number]  Date: 08/05/2015  DOB: 1965/06/29  Weekly Radiation Therapy Management    ICD-9-CM ICD-10-CM   1. Breast cancer of lower-outer quadrant of right female breast (HCC) 174.5 C50.511      Current Dose: 18 Gy     Planned Dose:  60.4 Gy  Narrative . . . . . . . . The patient presents for routine under treatment assessment.                                   The patient is without complaint.  She has noticed some darkening of her skin but no significant itching or discomfort. No fatigue at this point. The patient will start back to work part-time next week                                 Set-up films were reviewed.                                 The chart was checked. Physical Findings. . .  height is 5\' 3"  (1.6 m) and weight is 180 lb 9.6 oz (81.92 kg). Her temperature is 98 F (36.7 C). Her blood pressure is 147/106 and her pulse is 74. . The lungs are clear. The heart has a regular rhythm and rate. The right breast area shows some hyperpigmentation changes. Impression . . . . . . . The patient is tolerating radiation. Plan . . . . . . . . . . . . Continue treatment as planned.  ________________________________   Blair Promise, PhD, MD

## 2015-08-06 ENCOUNTER — Encounter: Payer: BLUE CROSS/BLUE SHIELD | Admitting: Occupational Therapy

## 2015-08-06 ENCOUNTER — Ambulatory Visit
Admission: RE | Admit: 2015-08-06 | Discharge: 2015-08-06 | Disposition: A | Discharge status: Home / Self Care | Payer: BLUE CROSS/BLUE SHIELD | Source: Ambulatory Visit | Attending: Radiation Oncology | Admitting: Radiation Oncology

## 2015-08-06 DIAGNOSIS — Z510 Encounter for antineoplastic radiation therapy: Secondary | ICD-10-CM | POA: Diagnosis not present

## 2015-08-07 ENCOUNTER — Encounter: Payer: Self-pay | Admitting: Occupational Therapy

## 2015-08-07 ENCOUNTER — Ambulatory Visit
Admission: RE | Admit: 2015-08-07 | Discharge: 2015-08-07 | Disposition: A | Discharge status: Home / Self Care | Payer: BLUE CROSS/BLUE SHIELD | Source: Ambulatory Visit | Attending: Radiation Oncology | Admitting: Radiation Oncology

## 2015-08-07 DIAGNOSIS — Z510 Encounter for antineoplastic radiation therapy: Secondary | ICD-10-CM | POA: Diagnosis not present

## 2015-08-07 NOTE — Therapy (Signed)
[facility] [address] [place], [place], [address] Phone: [phone]   Fax:  [phone]  Patient Details  Name: Kaitlyn Keith MRN: [id number] Date of Birth: November 20, 1965 Referring Provider:  No ref. provider found  Encounter Date: 08/07/2015  Pt was only seen for O.T. Evaluation, and then called to cancel all remaining appointments due to extra expense and "everything else going on right now". Will d/c episode of care at this time per pt request. Did recommend P.T. Services at [place] [facility] location) if she had any arm pain that developed from radiation. Pt agreed to pursue if needed.     Carey Bullocks, OTR/L 08/07/2015, 12:20 PM  [facility] [address] [place], [place], [address] Phone: [phone]   Fax:  [phone]

## 2015-08-08 ENCOUNTER — Ambulatory Visit
Admission: RE | Admit: 2015-08-08 | Discharge: 2015-08-08 | Disposition: A | Discharge status: Home / Self Care | Payer: BLUE CROSS/BLUE SHIELD | Source: Ambulatory Visit | Attending: Radiation Oncology | Admitting: Radiation Oncology

## 2015-08-08 DIAGNOSIS — Z510 Encounter for antineoplastic radiation therapy: Secondary | ICD-10-CM | POA: Diagnosis not present

## 2015-08-11 ENCOUNTER — Ambulatory Visit
Admission: RE | Admit: 2015-08-11 | Discharge: 2015-08-11 | Disposition: A | Discharge status: Home / Self Care | Payer: BLUE CROSS/BLUE SHIELD | Source: Ambulatory Visit | Attending: Radiation Oncology | Admitting: Radiation Oncology

## 2015-08-11 DIAGNOSIS — Z510 Encounter for antineoplastic radiation therapy: Secondary | ICD-10-CM | POA: Diagnosis not present

## 2015-08-12 ENCOUNTER — Ambulatory Visit
Admission: RE | Admit: 2015-08-12 | Discharge: 2015-08-12 | Disposition: A | Discharge status: Home / Self Care | Payer: BLUE CROSS/BLUE SHIELD | Source: Ambulatory Visit | Attending: Radiation Oncology | Admitting: Radiation Oncology

## 2015-08-12 ENCOUNTER — Telehealth: Payer: Self-pay | Admitting: Oncology

## 2015-08-12 VITALS — BP 165/112 | HR 64 | Temp 97.8°F | Resp 16 | Ht 63.0 in | Wt 181.5 lb

## 2015-08-12 DIAGNOSIS — C50511 Malignant neoplasm of lower-outer quadrant of right female breast: Secondary | ICD-10-CM

## 2015-08-12 DIAGNOSIS — Z510 Encounter for antineoplastic radiation therapy: Secondary | ICD-10-CM | POA: Diagnosis not present

## 2015-08-12 NOTE — Progress Notes (Signed)
Kaitlyn Keith has completed 15 fractions to her right breast.  She reports increasing pain due to neuropathy in her hands and bone pain in her right foot and left hip/lower back.  She is taking gabapentin and percocet (1.5 tablets per day).  She reports not being able to sleep because of the pain for several nights.  Her bp was elevated today at 165/112.  She reports feeling stressed out and is worried about returning to work on Monday.  The skin on her right breast has hyperpigmentation.  She is using radiaplex.  BP 165/112 mmHg  Pulse 64  Temp(Src) 97.8 F (36.6 C) (Oral)  Resp 16  Ht 5\' 3"  (1.6 m)  Wt 181 lb 8 oz (82.328 kg)  BMI 32.16 kg/m2.

## 2015-08-12 NOTE — Assessment & Plan Note (Signed)
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 Neoadj chemo 12/24/14- 04/29/15 AC x 4 foll by Abraxane X 12 Rt Lumpectomy: Path CR 0/2 LN  Treatment plan:  1. Adj XRT: started 07/23/15  Chemotherapy-induced peripheral neuropathy: It started after chemotherapy has been completed. Patient is currently on Neurontin. Titrating upwards up to 300 mg dose.  RTC in 6 months

## 2015-08-12 NOTE — Telephone Encounter (Signed)
Called Dr. Geralyn Flash nurse and left a message that [name] is having increased neuropathy pain in her hands and bone pain in her right foot/left hip. She is concerned about returning to work on Monday.  Requested a return call.

## 2015-08-12 NOTE — Progress Notes (Signed)
  Radiation Oncology         [phone] ________________________________  Name: Kaitlyn Keith MRN: [id number]  Date: 08/12/2015  DOB: 10/19/1965  Weekly Radiation Therapy Management    ICD-9-CM ICD-10-CM   1. Breast cancer of lower-outer quadrant of right female breast (HCC) 174.5 C50.511      Current Dose: 27 Gy     Planned Dose:  60.4 Gy  Narrative . . . . . . . . The patient presents for routine under treatment assessment.                                   Mareen Donlon has completed 15 fractions to her right breast. She reports increasing pain due to neuropathy in her hands and bone pain in her right foot and left hip/lower back. She is taking gabapentin and percocet (1.5 tablets per day). She reports not being able to sleep because of the pain for several nights. Her bp was elevated today at 165/112. She reports feeling stressed out and is worried about returning to work on Monday.  She is using radiaplex. No itching or discomfort along the right breast.                                 Set-up films were reviewed.                                 The chart was checked. Physical Findings. . .  height is 5\' 3"  (1.6 m) and weight is 181 lb 8 oz (82.328 kg). Her oral temperature is 97.8 F (36.6 C). Her blood pressure is 165/112 and her pulse is 64. Her respiration is 16. . Weight essentially stable.  No significant changes.  The lungs are clear. The heart has a regular rhythm and rate. The right breast area shows hyperpigmentation changes but no significant skin reaction at this point Impression . . . . . . . The patient is tolerating radiation. Plan . . . . . . . . . . . . Continue treatment as planned. Consultation with medical oncology concerning her neuropathy. The patient may need extension of her disability as she says she is unable to type at this time due to her neuropathy,  which is a requirement for her work. The patient wishes to consider a bone scan or MRI for further  evaluation of her left hip pain.  ________________________________   Blair Promise, PhD, MD

## 2015-08-13 ENCOUNTER — Encounter: Payer: Self-pay | Admitting: Hematology and Oncology

## 2015-08-13 ENCOUNTER — Telehealth: Payer: Self-pay | Admitting: Hematology and Oncology

## 2015-08-13 ENCOUNTER — Ambulatory Visit
Admission: RE | Admit: 2015-08-13 | Discharge: 2015-08-13 | Disposition: A | Discharge status: Home / Self Care | Payer: BLUE CROSS/BLUE SHIELD | Source: Ambulatory Visit | Attending: Radiation Oncology | Admitting: Radiation Oncology

## 2015-08-13 ENCOUNTER — Ambulatory Visit (HOSPITAL_BASED_OUTPATIENT_CLINIC_OR_DEPARTMENT_OTHER): Payer: BLUE CROSS/BLUE SHIELD | Admitting: Hematology and Oncology

## 2015-08-13 VITALS — BP 124/86 | HR 70 | Temp 97.4°F | Resp 18 | Wt 179.5 lb

## 2015-08-13 DIAGNOSIS — Z171 Estrogen receptor negative status [ER-]: Secondary | ICD-10-CM | POA: Diagnosis not present

## 2015-08-13 DIAGNOSIS — G620 Drug-induced polyneuropathy: Secondary | ICD-10-CM

## 2015-08-13 DIAGNOSIS — Z510 Encounter for antineoplastic radiation therapy: Secondary | ICD-10-CM | POA: Diagnosis not present

## 2015-08-13 DIAGNOSIS — C50511 Malignant neoplasm of lower-outer quadrant of right female breast: Secondary | ICD-10-CM | POA: Diagnosis not present

## 2015-08-13 MED ORDER — DULOXETINE HCL 20 MG PO CPEP: 20.0000 mg | ORAL_CAPSULE | Freq: Every day | ORAL | Status: DC

## 2015-08-13 MED ORDER — TRAMADOL HCL 50 MG PO TABS: 50.0000 mg | ORAL_TABLET | Freq: Two times a day (BID) | ORAL | Status: DC

## 2015-08-13 NOTE — Progress Notes (Signed)
Patient Care Team: Secundino Ginger, [name], [name], [name], [name], [name], [name], NP as Nurse Practitioner (Nurse Practitioner)  DIAGNOSIS: Breast cancer of lower-outer quadrant of right female breast [facility])   Staging form: Breast, AJCC 7th Edition     Clinical stage from 12/11/2014: Stage IIA (T2, N0, M0) - Unsigned       Staging comments: Staged at breast conference on 7.20.16  SUMMARY OF ONCOLOGIC HISTORY:   Breast cancer of lower-outer quadrant of right female breast ([facility])   12/03/2014 Mammogram Right breast mass 1.9 cm it o'clock position 8 cm depth from the nipple   12/03/2014 Initial Diagnosis Right breast biopsy 8:00: Invasive ductal carcinoma, grade 3, ER 0%, PR 0%, 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%, HER-2 negative ratio 1.43   12/10/2014 Breast MRI Right breast lower outer quadrant: 2.3 x 2.4 x 2.4 cm rim-enhancing mass abuts the pectoralis fascia but no enhancement of pectoralis muscle, second focus of artifact?'s second tissue marker clip, no lymph nodes   12/24/2014 - 04/29/2015 Neo-Adjuvant Chemotherapy Dose dense Adriamycin and Cytoxan 4 followed by weekly Abraxane 12   05/02/2015 Breast MRI complete radiologic response   06/16/2015 Surgery Left Lumpectomy: Complete path Response, 0/2 LN   CHIEF COMPLIANT: Currently on radiation complains of neuropathy  INTERVAL HISTORY: Kaitlyn Keith is a 50 year old with above-mentioned history of right breast cancer currently on adjuvant radiation therapy. She is here to discuss her peripheral neuropathy. She has pain in the fingers especially worse at night. She has tingling and numbness up to the middle of her phalanges. She does not have weakness of her muscles. She had completed physical  therapy and did not feel that has any benefit from them because it is my belief sensory neuropathy and increased pain especially at bedtime.  REVIEW OF SYSTEMS:   Constitutional: Denies fevers, chills or abnormal weight loss Eyes: Denies blurriness of vision Ears, nose, mouth, throat, and face: Denies mucositis or sore throat Respiratory: Denies cough, dyspnea or wheezes Cardiovascular: Denies palpitation, chest discomfort Gastrointestinal:  Denies nausea, heartburn or change in bowel habits Skin: Denies abnormal skin rashes Lymphatics: Denies new lymphadenopathy or easy bruising Neurological: Severe peripheral neuropathy especially worse at night in the fingers. Behavioral/Psych: Mood is stable, no new changes  Extremities: No lower extremity edema  All other systems were reviewed with the patient and are negative.  I have reviewed the past medical history, past surgical history, social history and family history with the patient and they are unchanged from previous note.  ALLERGIES:  has No Known Allergies.  MEDICATIONS:  Current Outpatient Prescriptions  Medication Sig Dispense Refill  . ALPRAZolam (XANAX) 1 MG tablet Take 2 tablets (2 mg total) by mouth at bedtime. 60 tablet 1  . gabapentin (NEURONTIN) 400 MG capsule Take 400 mg (1tab) PO TID 100 capsule 1  . hyaluronate sodium (RADIAPLEXRX) GEL Apply 1 application topically 2 (two) times daily. Reported on 07/29/2015    . lisinopril-hydrochlorothiazide (PRINZIDE,ZESTORETIC) 20-12.5 MG tablet Take 1 tablet by mouth 2 (two) times daily. 60 tablet 6  . LORazepam (ATIVAN) 1 MG tablet TAKE ONE TABLET BY MOUTH EVERY 6 HOURS AS NEEDED FOR NAUSEA AND VOMITING 30 tablet 0  . non-metallic deodorant (ALRA) MISC Apply 1 application topically daily as needed.    Marland Kitchen  oxyCODONE-acetaminophen (PERCOCET/ROXICET) 5-325 MG tablet Take 1-2 tablets by mouth every 6 (six) hours as needed for severe pain. 30 tablet 0  . sertraline (ZOLOFT) 50 MG tablet Take  1 tablet (50 mg total) by mouth daily. 30 tablet 6  . vitamin B-12 (CYANOCOBALAMIN) 250 MCG tablet Take 500 mcg by mouth daily.    . Vitamin D, Ergocalciferol, (DRISDOL) 50000 UNITS CAPS capsule Take 1 capsule (50,000 Units total) by mouth every 7 (seven) days. 12 capsule 3   No current facility-administered medications for this visit.   Facility-Administered Medications Ordered in Other Visits  Medication Dose Route Frequency Provider Last Rate Last Dose  . sodium chloride 0.9 % injection 10 mL  10 mL Intracatheter PRN [name], MD   10 mL at 03/04/15 1638    PHYSICAL EXAMINATION: ECOG PERFORMANCE STATUS: 1 - Symptomatic but completely ambulatory  Filed Vitals:   08/13/15 1105  BP: 124/86  Pulse: 70  Temp: 97.4 F (36.3 C)  Resp: 18   Filed Weights   08/13/15 1105  Weight: 179 lb 8 oz (81.421 kg)    GENERAL:alert, no distress and comfortable SKIN: skin color, texture, turgor are normal, no rashes or significant lesions EYES: normal, Conjunctiva are pink and non-injected, sclera clear OROPHARYNX:no exudate, no erythema and lips, buccal mucosa, and tongue normal  NECK: supple, thyroid normal size, non-tender, without nodularity LYMPH:  no palpable lymphadenopathy in the cervical, axillary or inguinal LUNGS: clear to auscultation and percussion with normal breathing effort HEART: regular rate & rhythm and no murmurs and no lower extremity edema ABDOMEN:abdomen soft, non-tender and normal bowel sounds MUSCULOSKELETAL:no cyanosis of digits and no clubbing  NEURO: alert & oriented x 3 with fluent speech, no focal motor/sensory deficits EXTREMITIES: No lower extremity edema  LABORATORY DATA:  I have reviewed the data as listed   Chemistry      Component Value Date/Time   NA 139 06/23/2015 1352   NA 140 06/10/2015 1250   K 3.9 06/23/2015 1352   K 3.8 06/10/2015 1250   CL 104 06/10/2015 1250   CO2 24 06/23/2015 1352   CO2 28 06/10/2015 1250   BUN 14.9 06/23/2015 1352     BUN 9 06/10/2015 1250   CREATININE 0.8 06/23/2015 1352   CREATININE 0.70 06/10/2015 1250      Component Value Date/Time   CALCIUM 9.0 06/23/2015 1352   CALCIUM 9.3 06/10/2015 1250   ALKPHOS 91 06/23/2015 1352   AST 13 06/23/2015 1352   ALT 13 06/23/2015 1352   BILITOT 0.41 06/23/2015 1352       Lab Results  Component Value Date   WBC 5.2 06/23/2015   HGB 13.1 06/23/2015   HCT 40.1 06/23/2015   MCV 87.6 06/23/2015   PLT 189 06/23/2015   NEUTROABS 3.1 06/23/2015     ASSESSMENT & PLAN:  Breast cancer of lower-outer quadrant of right female breast Right breast biopsy 12/03/2014 8:00: Invasive ductal carcinoma, grade 3, ER 0%, PR 0%, 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 Neoadj chemo 12/24/14- 04/29/15 AC x 4 foll by Abraxane X 12 Rt Lumpectomy: Path CR 0/2 LN  Treatment plan:  1. Adj XRT: started 07/23/15  Chemotherapy-induced peripheral neuropathy: It started after chemotherapy has been completed. Patient is currently on Neurontin currently at 400 mg by mouth 3 times a day. We discussed different options because the  pain appears to be worse at night. She would like to try Cymbalta. I instructed her to discontinue Zoloft and switch her to Cymbalta. I gave a prescription for that today. I will also given her a prescription for Ultram for pain that she can take at bedtime.  RTC in 6 months   No orders of the defined types were placed in this encounter.   The patient has a good understanding of the overall plan. she agrees with it. she will call with any problems that may develop before the next visit here.   Rulon Eisenmenger, MD 08/13/2015

## 2015-08-13 NOTE — Telephone Encounter (Signed)
appt made per 3/22 pof. Spoke with patient and confirmed appt date/time

## 2015-08-14 ENCOUNTER — Ambulatory Visit
Admission: RE | Admit: 2015-08-14 | Discharge: 2015-08-14 | Disposition: A | Discharge status: Home / Self Care | Payer: BLUE CROSS/BLUE SHIELD | Source: Ambulatory Visit | Attending: Radiation Oncology | Admitting: Radiation Oncology

## 2015-08-14 DIAGNOSIS — Z510 Encounter for antineoplastic radiation therapy: Secondary | ICD-10-CM | POA: Diagnosis not present

## 2015-08-15 ENCOUNTER — Ambulatory Visit
Admission: RE | Admit: 2015-08-15 | Discharge: 2015-08-15 | Disposition: A | Discharge status: Home / Self Care | Payer: BLUE CROSS/BLUE SHIELD | Source: Ambulatory Visit | Attending: Radiation Oncology | Admitting: Radiation Oncology

## 2015-08-15 DIAGNOSIS — Z510 Encounter for antineoplastic radiation therapy: Secondary | ICD-10-CM | POA: Diagnosis not present

## 2015-08-18 ENCOUNTER — Encounter: Payer: Self-pay | Admitting: *Deleted

## 2015-08-18 ENCOUNTER — Ambulatory Visit
Admission: RE | Admit: 2015-08-18 | Discharge: 2015-08-18 | Disposition: A | Discharge status: Home / Self Care | Payer: BLUE CROSS/BLUE SHIELD | Source: Ambulatory Visit | Attending: Radiation Oncology | Admitting: Radiation Oncology

## 2015-08-18 DIAGNOSIS — Z510 Encounter for antineoplastic radiation therapy: Secondary | ICD-10-CM | POA: Diagnosis not present

## 2015-08-19 ENCOUNTER — Ambulatory Visit
Admission: RE | Admit: 2015-08-19 | Discharge: 2015-08-19 | Disposition: A | Discharge status: Home / Self Care | Payer: BLUE CROSS/BLUE SHIELD | Source: Ambulatory Visit | Attending: Radiation Oncology | Admitting: Radiation Oncology

## 2015-08-19 ENCOUNTER — Encounter: Payer: Self-pay | Admitting: Radiation Oncology

## 2015-08-19 VITALS — BP 147/99 | HR 76 | Temp 98.2°F | Ht 63.0 in | Wt 181.6 lb

## 2015-08-19 DIAGNOSIS — C50511 Malignant neoplasm of lower-outer quadrant of right female breast: Secondary | ICD-10-CM

## 2015-08-19 DIAGNOSIS — Z510 Encounter for antineoplastic radiation therapy: Secondary | ICD-10-CM | POA: Diagnosis not present

## 2015-08-19 NOTE — Progress Notes (Signed)
  Radiation Oncology         [phone] ________________________________  Name: Kaitlyn Keith MRN: [id number]  Date: 08/19/2015  DOB: 1965/07/08  Weekly Radiation Therapy Management    ICD-9-CM ICD-10-CM   1. Breast cancer of lower-outer quadrant of right female breast (HCC) 174.5 C50.511      Current Dose: 36 Gy     Planned Dose:  60.4 Gy  Narrative . . . . . . . . The patient presents for routine under treatment assessment.                                   Kaitlyn Keith has completed 20 fractions to her right breast. She continues to have pain due to neuropathy in her hands. She was stated taking cymbalta in addition to gabapentin and percocet. She reports having fatigue. She has returned to work part time (4 hours per day). She has hyperpigmentation on her right breast. She is using radiaplex.  No itching or discomfort within the breast.                                 Set-up films were reviewed.                                 The chart was checked. Physical Findings. . .  height is 5\' 3"  (1.6 m) and weight is 181 lb 9.6 oz (82.373 kg). Her oral temperature is 98.2 F (36.8 C). Her blood pressure is 147/99 and her pulse is 76. . Weight essentially stable.  No significant changes. She has hyperpigmentation on her right breast. Lungs are clear. The heart has regular rhythm and rate Impression . . . . . . . The patient is tolerating radiation. Plan . . . . . . . . . . . . Continue treatment as planned.  ________________________________   Blair Promise, PhD, MD

## 2015-08-19 NOTE — Progress Notes (Signed)
Kaitlyn Keith has completed 20 fractions to her right breast.  She continues to have pain due to neuropathy in her hands.  She was stated taking cymbalta in addition to gabapentin and percocet.  She reports having fatigue.  She has returned to work part time (4 hours per day).  She has hyperpigmentation on her right breast.  She is using radiaplex.  BP 147/99 mmHg  Pulse 76  Temp(Src) 98.2 F (36.8 C) (Oral)  Ht 5\' 3"  (1.6 m)  Wt 181 lb 9.6 oz (82.373 kg)  BMI 32.18 kg/m2

## 2015-08-19 NOTE — Progress Notes (Signed)
  Radiation Oncology         [phone] ________________________________  Name: Kaitlyn Keith MRN: [id number]  Date: 08/19/2015  DOB: 04-10-1966  Electron beam Simulation  Note    ICD-9-CM ICD-10-CM   1. Breast cancer of lower-outer quadrant of right female breast ([place]) 174.5 C50.511     Status: outpatient  NARRATIVE: Earlier today the patient underwent additional planning for radiation therapy directed the right breast. The patient's treatment planning CT scan was reviewed and she had set up of a custom electron cutout will directed at the lumpectomy cavity within the right breast. Patient will be treated with a 15 megavoltage custom electron cutout. An electron monte Carlo isodose plan was generated for treatment.  Plan the patient received 5 additional treatments at 2 gray per fraction for a boost dose of 10 gray. -----------------------------------  Blair Promise, PhD, MD

## 2015-08-20 ENCOUNTER — Ambulatory Visit
Admission: RE | Admit: 2015-08-20 | Discharge: 2015-08-20 | Disposition: A | Discharge status: Home / Self Care | Payer: BLUE CROSS/BLUE SHIELD | Source: Ambulatory Visit | Attending: Radiation Oncology | Admitting: Radiation Oncology

## 2015-08-20 DIAGNOSIS — Z510 Encounter for antineoplastic radiation therapy: Secondary | ICD-10-CM | POA: Diagnosis not present

## 2015-08-21 ENCOUNTER — Ambulatory Visit
Admission: RE | Admit: 2015-08-21 | Discharge: 2015-08-21 | Disposition: A | Discharge status: Home / Self Care | Payer: BLUE CROSS/BLUE SHIELD | Source: Ambulatory Visit | Attending: Radiation Oncology | Admitting: Radiation Oncology

## 2015-08-21 DIAGNOSIS — Z510 Encounter for antineoplastic radiation therapy: Secondary | ICD-10-CM | POA: Diagnosis not present

## 2015-08-22 ENCOUNTER — Ambulatory Visit
Admission: RE | Admit: 2015-08-22 | Discharge: 2015-08-22 | Disposition: A | Discharge status: Home / Self Care | Payer: BLUE CROSS/BLUE SHIELD | Source: Ambulatory Visit | Attending: Radiation Oncology | Admitting: Radiation Oncology

## 2015-08-22 ENCOUNTER — Encounter: Payer: Self-pay | Admitting: Hematology and Oncology

## 2015-08-22 DIAGNOSIS — Z510 Encounter for antineoplastic radiation therapy: Secondary | ICD-10-CM | POA: Diagnosis not present

## 2015-08-22 NOTE — Progress Notes (Signed)
Sent forms faxed 06/12/15 to medical records-sharepoint noted

## 2015-08-25 ENCOUNTER — Ambulatory Visit
Admission: RE | Admit: 2015-08-25 | Discharge: 2015-08-25 | Disposition: A | Discharge status: Home / Self Care | Payer: BLUE CROSS/BLUE SHIELD | Source: Ambulatory Visit | Attending: Radiation Oncology | Admitting: Radiation Oncology

## 2015-08-25 DIAGNOSIS — Z510 Encounter for antineoplastic radiation therapy: Secondary | ICD-10-CM | POA: Diagnosis not present

## 2015-08-26 ENCOUNTER — Ambulatory Visit
Admission: RE | Admit: 2015-08-26 | Discharge: 2015-08-26 | Disposition: A | Discharge status: Home / Self Care | Payer: BLUE CROSS/BLUE SHIELD | Source: Ambulatory Visit | Attending: Radiation Oncology | Admitting: Radiation Oncology

## 2015-08-26 ENCOUNTER — Encounter: Payer: Self-pay | Admitting: Radiation Oncology

## 2015-08-26 VITALS — BP 153/101 | HR 70 | Temp 98.3°F | Ht 63.0 in | Wt 182.5 lb

## 2015-08-26 DIAGNOSIS — C50511 Malignant neoplasm of lower-outer quadrant of right female breast: Secondary | ICD-10-CM

## 2015-08-26 DIAGNOSIS — Z510 Encounter for antineoplastic radiation therapy: Secondary | ICD-10-CM | POA: Diagnosis not present

## 2015-08-26 NOTE — Progress Notes (Addendum)
Kaitlyn Keith has completed 25 fractions to her right breast.  She reports pain at a 3/10 in her fingers from neuropathy.  She reports having fatigue.  The skin on her right breast has hyperpigmentation.  She is using radiaplex.  Her bp was elevated at 159/122 and 153/101 when rechecked.  BP 153/101 mmHg  Pulse 70  Temp(Src) 98.3 F (36.8 C) (Oral)  Ht 5\' 3"  (1.6 m)  Wt 182 lb 8 oz (82.781 kg)  BMI 32.34 kg/m2

## 2015-08-26 NOTE — Progress Notes (Signed)
  Radiation Oncology         [phone] ________________________________  Name: Kaitlyn Keith MRN: [id number]  Date: 08/26/2015  DOB: 19-Jun-1965  Weekly Radiation Therapy Management    ICD-9-CM ICD-10-CM   1. Breast cancer of lower-outer quadrant of right female breast (HCC) 174.5 C50.511      Current Dose: 45 Gy     Planned Dose:  60.4 Gy  Narrative . . . . . . . . The patient presents for routine under treatment assessment.                                   Kaitlyn Keith has completed 25 fractions to her right breast. She reports pain at a 3/10 in her fingers from neuropathy. She reports having fatigue.  She is using radiaplex. Her bp was elevated at 159/122 and 153/101 when rechecked.                                 Set-up films were reviewed.                                 The chart was checked. Physical Findings. . .  height is 5\' 3"  (1.6 m) and weight is 182 lb 8 oz (82.781 kg). Her oral temperature is 98.3 F (36.8 C). Her blood pressure is 153/101 and her pulse is 70. Marland Kitchen Hyperpigmentation changes noted in the right breast. No dry or moist desquamation the lungs are clear. The heart has a regular rhythm and rate Impression . . . . . . . The patient is tolerating radiation. Plan . . . . . . . . . . . . Continue treatment as planned. Recommended she follow-up with her primary care physician concerning her elevated blood pressure.  Neuropathy seems better with Cymbalta  ________________________________   Blair Promise, PhD, MD

## 2015-08-26 NOTE — Progress Notes (Signed)
  Radiation Oncology         [phone] ________________________________  Name: Kaitlyn Keith MRN: [id number]  Date: 08/26/2015  DOB: Apr 08, 1966  Electron beam Simulation Verification Note    ICD-9-CM ICD-10-CM   1. Breast cancer of lower-outer quadrant of right female breast ([place]) 174.5 C50.511     Status: outpatient  NARRATIVE: The patient was brought to the treatment unit and placed in the planned treatment position. The clinical setup was verified. Then port films were obtained and uploaded to the radiation oncology medical record software.  The treatment beams were carefully compared against the planned radiation fields. The position location and shape of the radiation fields was reviewed. They targeted volume of tissue appears to be appropriately covered by the radiation beams. Organs at risk appear to be excluded as planned.  Based on my personal review, I approved the simulation verification. The patient's treatment will proceed as planned.  -----------------------------------  Blair Promise, PhD, MD

## 2015-08-27 ENCOUNTER — Ambulatory Visit
Admission: RE | Admit: 2015-08-27 | Discharge: 2015-08-27 | Disposition: A | Discharge status: Home / Self Care | Payer: BLUE CROSS/BLUE SHIELD | Source: Ambulatory Visit | Attending: Radiation Oncology | Admitting: Radiation Oncology

## 2015-08-27 DIAGNOSIS — Z510 Encounter for antineoplastic radiation therapy: Secondary | ICD-10-CM | POA: Diagnosis not present

## 2015-08-28 ENCOUNTER — Ambulatory Visit
Admission: RE | Admit: 2015-08-28 | Discharge: 2015-08-28 | Disposition: A | Discharge status: Home / Self Care | Payer: BLUE CROSS/BLUE SHIELD | Source: Ambulatory Visit | Attending: Radiation Oncology | Admitting: Radiation Oncology

## 2015-08-28 DIAGNOSIS — Z510 Encounter for antineoplastic radiation therapy: Secondary | ICD-10-CM | POA: Diagnosis not present

## 2015-08-29 ENCOUNTER — Ambulatory Visit
Admission: RE | Admit: 2015-08-29 | Discharge: 2015-08-29 | Disposition: A | Discharge status: Home / Self Care | Payer: BLUE CROSS/BLUE SHIELD | Source: Ambulatory Visit | Attending: Radiation Oncology | Admitting: Radiation Oncology

## 2015-08-29 ENCOUNTER — Encounter: Payer: BLUE CROSS/BLUE SHIELD | Admitting: Podiatry

## 2015-08-29 DIAGNOSIS — Z510 Encounter for antineoplastic radiation therapy: Secondary | ICD-10-CM | POA: Diagnosis not present

## 2015-09-01 ENCOUNTER — Ambulatory Visit
Admission: RE | Admit: 2015-09-01 | Discharge: 2015-09-01 | Disposition: A | Discharge status: Home / Self Care | Payer: BLUE CROSS/BLUE SHIELD | Source: Ambulatory Visit | Attending: Radiation Oncology | Admitting: Radiation Oncology

## 2015-09-01 DIAGNOSIS — Z510 Encounter for antineoplastic radiation therapy: Secondary | ICD-10-CM | POA: Diagnosis not present

## 2015-09-02 ENCOUNTER — Ambulatory Visit
Admission: RE | Admit: 2015-09-02 | Discharge: 2015-09-02 | Disposition: A | Discharge status: Home / Self Care | Payer: BLUE CROSS/BLUE SHIELD | Source: Ambulatory Visit | Attending: Radiation Oncology | Admitting: Radiation Oncology

## 2015-09-02 ENCOUNTER — Encounter: Payer: Self-pay | Admitting: Radiation Oncology

## 2015-09-02 VITALS — BP 157/116 | HR 80 | Temp 98.8°F | Ht 63.0 in | Wt 181.3 lb

## 2015-09-02 DIAGNOSIS — Z510 Encounter for antineoplastic radiation therapy: Secondary | ICD-10-CM | POA: Diagnosis not present

## 2015-09-02 DIAGNOSIS — C50511 Malignant neoplasm of lower-outer quadrant of right female breast: Secondary | ICD-10-CM

## 2015-09-02 NOTE — Progress Notes (Signed)
  Radiation Oncology         (336) [phone] ________________________________  Name: Kaitlyn Keith MRN: [id number]  Date: 09/02/2015  DOB: Apr 12, 1966  Weekly Radiation Therapy Management    ICD-9-CM ICD-10-CM   1. Breast cancer of lower-outer quadrant of right female breast (HCC) 174.5 C50.511     Current Dose: 54.4 Gy     Planned Dose:  60.4 Gy  Narrative . . . . . . . . The patient presents for routine under treatment assessment.                                   Kaitlyn Keith has completed 30 fractions to her right breast. She reports having pain in her right arm today at a 5/10. She is taking 2 1/2 tablets of percocet per day. She reports feeling sluggish. The skin on her right breast has hyperpigmentation and some peeling under her right arm. She is using radiaplex.                                 Set-up films were reviewed.                                 The chart was checked. Physical Findings. . .  height is 5\' 3"  (1.6 m) and weight is 181 lb 4.8 oz (82.237 kg). Her oral temperature is 98.8 F (37.1 C). Her blood pressure is 157/116 and her pulse is 80. Marland KitchenHyperpigmentation changes and some dry desquamation. No moist desquamation.  Impression . . . . . . . The patient is tolerating radiation. Plan . . . . . . . . . . . . Continue treatment as planned.  ________________________________   Blair Promise, PhD, MD

## 2015-09-02 NOTE — Progress Notes (Signed)
Kaitlyn Keith has completed 30 fractions to her right breast.  She reports having pain in her right arm today at a 5/10.  She is taking 2 1/2 tablets of percocet per day.  She reports feeling sluggish.  The skin on her right breast has hyperpigmentation and some peeling under her right arm.  She is using radiaplex.  BP 157/116 mmHg  Pulse 80  Temp(Src) 98.8 F (37.1 C) (Oral)  Ht 5\' 3"  (1.6 m)  Wt 181 lb 4.8 oz (82.237 kg)  BMI 32.12 kg/m2

## 2015-09-03 ENCOUNTER — Ambulatory Visit
Admission: RE | Admit: 2015-09-03 | Discharge: 2015-09-03 | Disposition: A | Discharge status: Home / Self Care | Payer: BLUE CROSS/BLUE SHIELD | Source: Ambulatory Visit | Attending: Radiation Oncology | Admitting: Radiation Oncology

## 2015-09-03 DIAGNOSIS — Z510 Encounter for antineoplastic radiation therapy: Secondary | ICD-10-CM | POA: Diagnosis not present

## 2015-09-04 ENCOUNTER — Ambulatory Visit
Admission: RE | Admit: 2015-09-04 | Discharge: 2015-09-04 | Disposition: A | Discharge status: Home / Self Care | Payer: BLUE CROSS/BLUE SHIELD | Source: Ambulatory Visit | Attending: Radiation Oncology | Admitting: Radiation Oncology

## 2015-09-04 ENCOUNTER — Encounter: Payer: Self-pay | Admitting: Radiation Oncology

## 2015-09-04 VITALS — BP 139/104 | HR 81 | Resp 16 | Wt 181.6 lb

## 2015-09-04 DIAGNOSIS — Z510 Encounter for antineoplastic radiation therapy: Secondary | ICD-10-CM | POA: Diagnosis not present

## 2015-09-04 DIAGNOSIS — C50511 Malignant neoplasm of lower-outer quadrant of right female breast: Secondary | ICD-10-CM

## 2015-09-04 NOTE — Progress Notes (Signed)
Weight stable. BP elevated. Denies pain. Reports new onset of fatigue within the last few days. No lymphedema of either upper extremity noted. Hyperpigmentation of right/treated breast noted with dry desquamation under her right axilla. Reports using radiaplex as directed. Understands to continue radiaplex bid for the next two weeks. Provided patient with one month follow up appointment card. Encouraged patient to contact this office with future needs reference treatment.   BP 139/104 mmHg  Pulse 81  Resp 16  Wt 181 lb 9.6 oz (82.373 kg)  SpO2 100% Wt Readings from Last 3 Encounters:  09/04/15 181 lb 9.6 oz (82.373 kg)  09/02/15 181 lb 4.8 oz (82.237 kg)  08/26/15 182 lb 8 oz (82.781 kg)

## 2015-09-04 NOTE — Progress Notes (Signed)
  Radiation Oncology         (336) [phone] ________________________________  Name: Kaitlyn Keith MRN: [id number]  Date: 09/04/2015  DOB: [birth year]  Weekly Radiation Therapy Management    ICD-9-CM ICD-10-CM   1. Breast cancer of lower-outer quadrant of right female breast (HCC) 174.5 C50.511     Current Dose: 58.4 Gy     Planned Dose:  60.4 Gy  Narrative . . . . . . . . The patient presents for routine under treatment assessment.                                   Denies pain. Reports a new onset of fatigue within the last few days. No lymphedema of either upper extremity noted. Reports using radiaplex as directed. Understands to continue radiaplex bid for the next two weeks. The nurse provided the patient with a one month follow up appointment card and encouraged the patient to contact the office with future needs.                                  Set-up films were reviewed.                                 The chart was checked. Physical Findings. . .  weight is 181 lb 9.6 oz (82.373 kg). Her blood pressure is 139/104 and her pulse is 81. Her respiration is 16 and oxygen saturation is 100%. .Hyperpigmentation changes and some dry desquamation, but no moist desquamation.  Impression . . . . . . . The patient is tolerating radiation. Plan . . . . . . . . . . . . Continue with one more treatment then routine follow-up in 1 month.  ________________________________   Blair Promise, PhD, MD  This document serves as a record of services personally performed by [name], MD. It was created on his behalf by Darcus Austin, a trained medical scribe. The creation of this record is based on the scribe's personal observations and the provider's statements to them. This document has been checked and approved by the attending provider.

## 2015-09-05 ENCOUNTER — Telehealth: Payer: Self-pay | Admitting: *Deleted

## 2015-09-05 ENCOUNTER — Ambulatory Visit
Admission: RE | Admit: 2015-09-05 | Discharge: 2015-09-05 | Disposition: A | Discharge status: Home / Self Care | Payer: BLUE CROSS/BLUE SHIELD | Source: Ambulatory Visit | Attending: Radiation Oncology | Admitting: Radiation Oncology

## 2015-09-05 ENCOUNTER — Encounter: Payer: Self-pay | Admitting: Radiation Oncology

## 2015-09-05 DIAGNOSIS — Z510 Encounter for antineoplastic radiation therapy: Secondary | ICD-10-CM | POA: Diagnosis not present

## 2015-09-05 NOTE — Telephone Encounter (Signed)
  Oncology Nurse Navigator Documentation    Navigator Encounter Type: Treatment (09/05/15 1100)           Patient Visit Type: [id number] (09/05/15 1100) Treatment Phase: Final Radiation Tx (09/05/15 1100)     Interventions: Referrals (09/05/15 1100) Referrals: Survivorship (09/05/15 1100)                    Time Spent with Patient: 15 (09/05/15 1100)

## 2015-09-10 ENCOUNTER — Encounter: Payer: BLUE CROSS/BLUE SHIELD | Admitting: Podiatry

## 2015-09-14 NOTE — Progress Notes (Signed)
  Radiation Oncology         (336) [phone] ________________________________  Name: Kaitlyn Keith MRN: [id number]  Date: 09/05/2015  DOB: [birth year]  End of Treatment Note  Diagnosis:     ICD-9-CM ICD-10-CM   1. Breast cancer of lower-outer quadrant of right female breast ([facility]) 174.5 C50.511        Indication for treatment:  Breast conservation therapy   Radiation treatment dates: 07/23/15 - 09/05/15  Site/dose: The right breast received 50.4 Gy in 28 fractions and a boost of 10 Gy in 5 fractions to total dose of 60.4 Gy  Beams/energy: Primary Right Breast: 3D-Conformal Other/ 10X, 6X Photon  Right Breast Boost: Electron Monte Carlo, 15MeV Electron  Narrative: The patient tolerated radiation treatment relatively well.  She developed some fatigue, minimal discomfort within the breast and itching. No moist desquamation at the completion of treatment.  Plan: The patient has completed radiation treatment. The patient will return to radiation oncology clinic for routine followup in one month. I advised them to call or return sooner if they have any questions or concerns related to their recovery or treatment.  -----------------------------------  Blair Promise, PhD, MD

## 2015-09-16 ENCOUNTER — Encounter: Payer: Self-pay | Admitting: *Deleted

## 2015-09-19 ENCOUNTER — Other Ambulatory Visit: Payer: Self-pay | Admitting: Adult Health

## 2015-09-19 ENCOUNTER — Telehealth: Payer: Self-pay

## 2015-09-19 ENCOUNTER — Encounter: Payer: Self-pay | Admitting: Radiation Oncology

## 2015-09-19 ENCOUNTER — Telehealth: Payer: Self-pay | Admitting: Hematology and Oncology

## 2015-09-19 DIAGNOSIS — C50511 Malignant neoplasm of lower-outer quadrant of right female breast: Secondary | ICD-10-CM

## 2015-09-19 MED ORDER — DULOXETINE HCL 20 MG PO CPEP: 40.0000 mg | ORAL_CAPSULE | Freq: Every day | ORAL | Status: DC

## 2015-09-19 NOTE — Telephone Encounter (Signed)
Writer returned call to patient re: cymbalta dose.  Per Dr. Lindi Adie, pt can increase her dose to 20 mg daily.  Advised pt I would send new Rx to pharmacy.  Pt voiced understanding.

## 2015-09-19 NOTE — Telephone Encounter (Signed)
s.w. pt and advise don 5.25 survivorship appt....pt ok and aware

## 2015-10-16 ENCOUNTER — Ambulatory Visit: Payer: BLUE CROSS/BLUE SHIELD | Admitting: Radiation Oncology

## 2015-10-16 ENCOUNTER — Encounter: Payer: BLUE CROSS/BLUE SHIELD | Admitting: Nurse Practitioner

## 2015-10-18 ENCOUNTER — Other Ambulatory Visit: Payer: Self-pay | Admitting: Hematology and Oncology

## 2015-10-21 ENCOUNTER — Other Ambulatory Visit: Payer: Self-pay

## 2015-10-21 DIAGNOSIS — C50511 Malignant neoplasm of lower-outer quadrant of right female breast: Secondary | ICD-10-CM

## 2015-10-21 MED ORDER — ALPRAZOLAM 1 MG PO TABS: 2.0000 mg | ORAL_TABLET | Freq: Every day | ORAL | Status: DC

## 2015-10-22 ENCOUNTER — Telehealth: Payer: Self-pay | Admitting: *Deleted

## 2015-10-22 NOTE — Progress Notes (Signed)
This encounter was created in error - please disregard.

## 2015-10-22 NOTE — Telephone Encounter (Signed)
TC to pt in regards to missed Survivorship appt. Unable to leave message- VM not set up.

## 2015-10-24 ENCOUNTER — Telehealth: Payer: Self-pay | Admitting: Nurse Practitioner

## 2015-10-24 NOTE — Telephone Encounter (Signed)
Called and spoke with patient after missed appointment 10/16/2015.  Pt doing well aside from some right side pain.  Reports that it has been ongoing x [redacted] week along side / ribs.  Not severe, wondering if it could be related to treatment.  Advised that sometimes can have pain along side / chest wall and that it would need to be evaluated. Pt's next appointment scheduled with Dr. Sondra Come 11/04/15.  Offered pt earlier appointment to evaluate pain but patient states that her schedule is busy and she would be unable to come in before that time.  Advised that if pain continued / worsened, to please notify us so that we could arrange earlier appointment.  Pt voiced agreement and also requested that care plan be mailed to her.  Will do so.  A letter was mailed to her outlining the purpose of the content of the care plan, as well as encouraging her to reach out to me with any questions or concerns.  My business card was included in the correspondence to the patient as well.  A copy of the care plan was also routed/faxed/mailed to Isaias Cowman, PA-C, the patient's PCP.  I will not be placing any follow-up appointments to the Survivorship Clinic for [name], but I am happy to see her at any time in the future for any survivorship concerns that may arise. Thank you for allowing me to participate in her care!  Kenn File, [place] [phone]

## 2015-11-04 ENCOUNTER — Other Ambulatory Visit: Payer: Self-pay | Admitting: *Deleted

## 2015-11-04 ENCOUNTER — Telehealth: Payer: Self-pay | Admitting: *Deleted

## 2015-11-04 ENCOUNTER — Ambulatory Visit
Admission: RE | Admit: 2015-11-04 | Discharge: 2015-11-04 | Disposition: A | Discharge status: Home / Self Care | Payer: BLUE CROSS/BLUE SHIELD | Source: Ambulatory Visit | Attending: Radiation Oncology | Admitting: Radiation Oncology

## 2015-11-04 ENCOUNTER — Encounter: Payer: Self-pay | Admitting: Oncology

## 2015-11-04 VITALS — BP 163/93 | HR 70 | Temp 98.7°F | Ht 63.0 in | Wt 185.5 lb

## 2015-11-04 DIAGNOSIS — Z923 Personal history of irradiation: Secondary | ICD-10-CM | POA: Diagnosis not present

## 2015-11-04 DIAGNOSIS — R5383 Other fatigue: Secondary | ICD-10-CM | POA: Insufficient documentation

## 2015-11-04 DIAGNOSIS — M545 Low back pain: Secondary | ICD-10-CM | POA: Insufficient documentation

## 2015-11-04 DIAGNOSIS — C50511 Malignant neoplasm of lower-outer quadrant of right female breast: Secondary | ICD-10-CM | POA: Diagnosis present

## 2015-11-04 DIAGNOSIS — L818 Other specified disorders of pigmentation: Secondary | ICD-10-CM | POA: Insufficient documentation

## 2015-11-04 DIAGNOSIS — Z79899 Other long term (current) drug therapy: Secondary | ICD-10-CM | POA: Diagnosis not present

## 2015-11-04 HISTORY — DX: Reserved for inherently not codable concepts without codable children: IMO0001

## 2015-11-04 HISTORY — DX: Reserved for concepts with insufficient information to code with codable children: IMO0002

## 2015-11-04 NOTE — Progress Notes (Signed)
Radiation Oncology         (336) [phone] ________________________________  Name: Kaitlyn Keith MRN: [id number]  Date: 11/04/2015  DOB: 1965/09/29  Follow-Up Visit Note  CC: Isaias Cowman, Gennette Pac, MD    ICD-9-CM ICD-10-CM   1. Breast cancer of lower-outer quadrant of right female breast ([facility]) 174.5 C50.511     Diagnosis:   Clinical stage from 12/11/2014: Stage IIA (T2, N0, M0)    Interval Since Last Radiation:  2  months  Narrative:  The patient returns today for routine follow-up.      Elio Forget here for follow up. She reports severe sharp pain in her left lower back that radiates to her left side. She reports it started about a month ago and she has trouble walking. She is taking tramadol 3 times a day and had to take percocet 2 tablets last night. Dr. Geralyn Flash office has ordered an MRI. She also reports having severe cramping in both legs on Sunday. She was unable to walk. She reports feeling fatigued and is working full time.No itching or discomfort within the right breast area.                         ALLERGIES:  has No Known Allergies.  Meds: Current Outpatient Prescriptions  Medication Sig Dispense Refill  . ALPRAZolam (XANAX) 1 MG tablet Take 2 tablets (2 mg total) by mouth at bedtime. 60 tablet 0  . DULoxetine (CYMBALTA) 20 MG capsule Take 2 capsules (40 mg total) by mouth daily. 30 capsule 3  . lisinopril-hydrochlorothiazide (PRINZIDE,ZESTORETIC) 20-12.5 MG tablet Take 1 tablet by mouth 2 (two) times daily. 60 tablet 6  . LORazepam (ATIVAN) 1 MG tablet TAKE ONE TABLET BY MOUTH EVERY 6 HOURS AS NEEDED FOR NAUSEA AND VOMITING 30 tablet 0  . oxyCODONE-acetaminophen (PERCOCET/ROXICET) 5-325 MG tablet Take 1-2 tablets by mouth every 6 (six) hours as needed for severe pain. 30 tablet 0  . sertraline (ZOLOFT) 50 MG tablet     . traMADol (ULTRAM) 50 MG tablet Take 1 tablet (50 mg total) by mouth 2 (two) times daily. 60 tablet 0  . vitamin B-12  (CYANOCOBALAMIN) 250 MCG tablet Take 500 mcg by mouth daily.    . Vitamin D, Ergocalciferol, (DRISDOL) 50000 UNITS CAPS capsule Take 1 capsule (50,000 Units total) by mouth every 7 (seven) days. 12 capsule 3  . gabapentin (NEURONTIN) 400 MG capsule Take 400 mg (1tab) PO TID (Patient not taking: Reported on 11/04/2015) 100 capsule 1  . hyaluronate sodium (RADIAPLEXRX) GEL Apply 1 application topically 2 (two) times daily. Reported on [id number]    . non-metallic deodorant Jethro Poling) MISC Apply 1 application topically daily as needed. Reported on 11/04/2015     No current facility-administered medications for this encounter.   Facility-Administered Medications Ordered in Other Encounters  Medication Dose Route Frequency Provider Last Rate Last Dose  . sodium chloride 0.9 % injection 10 mL  10 mL Intracatheter PRN [name], MD   10 mL at 03/04/15 1638    Physical Findings: The patient is in no acute distress. Patient is alert and oriented.  height is 5\' 3"  (1.6 m) and weight is 185 lb 8 oz (84.142 kg). Her oral temperature is 98.7 F (37.1 C). Her blood pressure is 163/93 and her pulse is 70. .  The lungs are clear. The heart has a regular rhythm and rate. The abdomen is soft and nontender with normal bowel sounds. The right breast  area shows hyperpigmentation changes. The patient's skin is well healed. No dominant mass appreciated in the breast nipple discharge or bleeding. The neurological exam is nonfocal area the patient points to pain in the upper lumbar spine with radiation into the left lower abdomen  Lab Findings: Lab Results  Component Value Date   WBC 5.2 06/23/2015   HGB 13.1 06/23/2015   HCT 40.1 06/23/2015   MCV 87.6 06/23/2015   PLT 189 06/23/2015    Radiographic Findings: No results found.  Impression:  The patient is recovering from the effects of radiation. No evidence of recurrence on clinical exam today  Plan:  Routine follow-up in 6 months.  as above patient will  proceed with an MRI for her back pain  ____________________________________ [name], MD

## 2015-11-04 NOTE — Progress Notes (Signed)
Kaitlyn Keith here for follow up.  She reports severe sharp pain in her left lower back that radiates to her left side.  She reports it started about a month ago and she has trouble walking.  She is taking tramadol 3 times a day and had to take percocet 2 tablets last night.  Dr. Geralyn Flash office has ordered an MRI.  She also reports having severe cramping in both legs on Sunday.  She was unable to walk.  She reports feeling fatigued and is working full time.  The skin on her right breast has slight hyperpigmentation.  BP 163/93 mmHg  Pulse 70  Temp(Src) 98.7 F (37.1 C) (Oral)  Ht 5\' 3"  (1.6 m)  Wt 185 lb 8 oz (84.142 kg)  BMI 32.87 kg/m2   Wt Readings from Last 3 Encounters:  11/04/15 185 lb 8 oz (84.142 kg)  09/04/15 181 lb 9.6 oz (82.373 kg)  09/02/15 181 lb 4.8 oz (82.237 kg)

## 2015-11-04 NOTE — Telephone Encounter (Signed)
Patient left message that she is having severe bone pain in her back,left hip and legs which has been going on for a month. She stated that she told Dr. Lindi Adie about this at last office visit. She has been using a heating pad, taking Tramadol and Percocet without relief. She has also been to a Restaurant manager, fast food. Nothing has really helped. Discussed with Dr. Lindi Adie. Order placed for MRI and patient to keep appt with Dr. Sondra Come today. Patient verbalized understanding.

## 2015-11-05 NOTE — Addendum Note (Signed)
Encounter addended by: Jacqulyn Liner, RN on: 11/05/2015  1:21 PM<BR>     Documentation filed: Charges VN

## 2015-11-06 ENCOUNTER — Ambulatory Visit (HOSPITAL_COMMUNITY)
Admission: RE | Admit: 2015-11-06 | Discharge: 2015-11-06 | Disposition: A | Discharge status: Home / Self Care | Payer: BLUE CROSS/BLUE SHIELD | Source: Ambulatory Visit | Attending: Hematology and Oncology | Admitting: Hematology and Oncology

## 2015-11-06 DIAGNOSIS — M5117 Intervertebral disc disorders with radiculopathy, lumbosacral region: Secondary | ICD-10-CM | POA: Diagnosis not present

## 2015-11-06 DIAGNOSIS — M549 Dorsalgia, unspecified: Secondary | ICD-10-CM | POA: Diagnosis present

## 2015-11-06 DIAGNOSIS — Z853 Personal history of malignant neoplasm of breast: Secondary | ICD-10-CM | POA: Insufficient documentation

## 2015-11-06 DIAGNOSIS — M25552 Pain in left hip: Secondary | ICD-10-CM | POA: Diagnosis not present

## 2015-11-06 DIAGNOSIS — C50511 Malignant neoplasm of lower-outer quadrant of right female breast: Secondary | ICD-10-CM

## 2015-11-06 LAB — POCT I-STAT CREATININE: CREATININE: 0.7 mg/dL (ref 0.44–1.00)

## 2015-11-06 MED ORDER — GADOBENATE DIMEGLUMINE 529 MG/ML IV SOLN
20.0000 mL | Freq: Once | INTRAVENOUS | Status: AC | PRN
  Administered 2015-11-06: 17 mL via INTRAVENOUS

## 2015-11-13 ENCOUNTER — Ambulatory Visit: Payer: BLUE CROSS/BLUE SHIELD | Admitting: Hematology and Oncology

## 2015-11-13 NOTE — Assessment & Plan Note (Signed)
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 Neoadj chemo 12/24/14- 04/29/15 AC x 4 foll by Abraxane X 12 Rt Lumpectomy: Path CR 0/2 LN  Treatment plan:  1. Adj XRT: started 07/23/15  Breast Cancer Surveillance: 1. Breast exam 11/13/2015: Normal 2. Mammogram to be done in July 2017   Chemotherapy-induced peripheral neuropathy:Patient is currently on Neurontin currently at 400 mg by mouth 3 times a day. With the last visit, we added Cymbalta and Ultram for pain that she can take at bedtime.  RTC in 6 months

## 2015-11-18 ENCOUNTER — Other Ambulatory Visit: Payer: Self-pay | Admitting: Hematology and Oncology

## 2015-11-18 DIAGNOSIS — M47819 Spondylosis without myelopathy or radiculopathy, site unspecified: Secondary | ICD-10-CM | POA: Insufficient documentation

## 2015-12-10 ENCOUNTER — Telehealth: Payer: Self-pay | Admitting: Hematology and Oncology

## 2015-12-10 NOTE — Telephone Encounter (Signed)
Called patient to confirm appointment. No voice mail available. Appointment letter and schedule mailed. Kaitlyn Keith.

## 2015-12-10 NOTE — Telephone Encounter (Signed)
Patient returned call to confirm appointment. Kaitlyn Keith.

## 2015-12-16 ENCOUNTER — Ambulatory Visit: Payer: BLUE CROSS/BLUE SHIELD | Admitting: Hematology and Oncology

## 2015-12-31 ENCOUNTER — Ambulatory Visit: Payer: BLUE CROSS/BLUE SHIELD | Admitting: Hematology and Oncology

## 2015-12-31 NOTE — Assessment & Plan Note (Deleted)
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 Neoadj chemo 12/24/14- 04/29/15 AC x 4 foll by Abraxane X 12 Rt Lumpectomy: Path CR 0/2 LN  Treatment plan:  1. Adj XRT: started 07/23/15  Chemotherapy-induced peripheral neuropathy: It started after chemotherapy has been completed. Patient is currently on Neurontin currently at 400 mg by mouth 3 times a day. We discussed different options because the pain appears to be worse at night. She would like to try Cymbalta. I instructed her to discontinue Zoloft and switch her to Cymbalta. I gave a prescription for that today. I will also given her a prescription for Ultram for pain that she can take at bedtime.  RTC in 6 months

## 2016-03-09 ENCOUNTER — Encounter: Payer: Self-pay | Admitting: *Deleted

## 2016-03-09 ENCOUNTER — Other Ambulatory Visit: Payer: Self-pay | Admitting: *Deleted

## 2016-03-09 DIAGNOSIS — C50511 Malignant neoplasm of lower-outer quadrant of right female breast: Secondary | ICD-10-CM

## 2016-03-09 MED ORDER — GABAPENTIN 400 MG PO CAPS: ORAL_CAPSULE | ORAL | 1 refills | Status: DC

## 2016-03-09 MED ORDER — LORAZEPAM 1 MG PO TABS: ORAL_TABLET | ORAL | 0 refills | Status: DC

## 2016-04-30 ENCOUNTER — Other Ambulatory Visit: Payer: Self-pay | Admitting: Hematology and Oncology

## 2016-04-30 DIAGNOSIS — C50511 Malignant neoplasm of lower-outer quadrant of right female breast: Secondary | ICD-10-CM

## 2016-05-14 ENCOUNTER — Other Ambulatory Visit: Payer: Self-pay | Admitting: Nurse Practitioner

## 2016-07-12 ENCOUNTER — Inpatient Hospital Stay (HOSPITAL_COMMUNITY): Payer: BLUE CROSS/BLUE SHIELD

## 2016-07-12 ENCOUNTER — Emergency Department (HOSPITAL_COMMUNITY): Payer: BLUE CROSS/BLUE SHIELD

## 2016-07-12 ENCOUNTER — Other Ambulatory Visit: Payer: Self-pay | Admitting: Neurological Surgery

## 2016-07-12 ENCOUNTER — Observation Stay (HOSPITAL_COMMUNITY)
Admission: EM | Admit: 2016-07-12 | Discharge: 2016-07-13 | Disposition: A | Discharge status: Home / Self Care | Payer: BLUE CROSS/BLUE SHIELD | Attending: Internal Medicine | Admitting: Internal Medicine

## 2016-07-12 ENCOUNTER — Encounter (HOSPITAL_COMMUNITY): Payer: Self-pay | Admitting: Emergency Medicine

## 2016-07-12 DIAGNOSIS — S12100A Unspecified displaced fracture of second cervical vertebra, initial encounter for closed fracture: Secondary | ICD-10-CM | POA: Diagnosis present

## 2016-07-12 DIAGNOSIS — Z79899 Other long term (current) drug therapy: Secondary | ICD-10-CM | POA: Diagnosis not present

## 2016-07-12 DIAGNOSIS — G622 Polyneuropathy due to other toxic agents: Secondary | ICD-10-CM | POA: Diagnosis not present

## 2016-07-12 DIAGNOSIS — I10 Essential (primary) hypertension: Secondary | ICD-10-CM | POA: Diagnosis not present

## 2016-07-12 DIAGNOSIS — R519 Headache, unspecified: Secondary | ICD-10-CM

## 2016-07-12 DIAGNOSIS — F329 Major depressive disorder, single episode, unspecified: Secondary | ICD-10-CM | POA: Diagnosis not present

## 2016-07-12 DIAGNOSIS — C7931 Secondary malignant neoplasm of brain: Secondary | ICD-10-CM

## 2016-07-12 DIAGNOSIS — Z853 Personal history of malignant neoplasm of breast: Secondary | ICD-10-CM | POA: Diagnosis not present

## 2016-07-12 DIAGNOSIS — G9389 Other specified disorders of brain: Secondary | ICD-10-CM

## 2016-07-12 DIAGNOSIS — M1990 Unspecified osteoarthritis, unspecified site: Secondary | ICD-10-CM | POA: Diagnosis not present

## 2016-07-12 DIAGNOSIS — C50511 Malignant neoplasm of lower-outer quadrant of right female breast: Secondary | ICD-10-CM | POA: Diagnosis present

## 2016-07-12 DIAGNOSIS — G939 Disorder of brain, unspecified: Secondary | ICD-10-CM | POA: Diagnosis not present

## 2016-07-12 DIAGNOSIS — F419 Anxiety disorder, unspecified: Secondary | ICD-10-CM | POA: Diagnosis not present

## 2016-07-12 DIAGNOSIS — R51 Headache: Secondary | ICD-10-CM | POA: Diagnosis present

## 2016-07-12 DIAGNOSIS — Z923 Personal history of irradiation: Secondary | ICD-10-CM | POA: Diagnosis not present

## 2016-07-12 DIAGNOSIS — Z8049 Family history of malignant neoplasm of other genital organs: Secondary | ICD-10-CM

## 2016-07-12 DIAGNOSIS — Z9221 Personal history of antineoplastic chemotherapy: Secondary | ICD-10-CM | POA: Insufficient documentation

## 2016-07-12 DIAGNOSIS — X58XXXA Exposure to other specified factors, initial encounter: Secondary | ICD-10-CM | POA: Insufficient documentation

## 2016-07-12 LAB — CBC WITH DIFFERENTIAL/PLATELET
BASOS ABS: 0 10*3/uL (ref 0.0–0.1)
Basophils Relative: 0 %
Eosinophils Absolute: 0 10*3/uL (ref 0.0–0.7)
Eosinophils Relative: 0 %
HEMATOCRIT: 44.8 % (ref 36.0–46.0)
HEMOGLOBIN: 15.3 g/dL — AB (ref 12.0–15.0)
LYMPHS PCT: 15 %
Lymphs Abs: 0.8 10*3/uL (ref 0.7–4.0)
MCH: 29.7 pg (ref 26.0–34.0)
MCHC: 34.2 g/dL (ref 30.0–36.0)
MCV: 87 fL (ref 78.0–100.0)
MONO ABS: 0.3 10*3/uL (ref 0.1–1.0)
Monocytes Relative: 5 %
NEUTROS ABS: 4.3 10*3/uL (ref 1.7–7.7)
Neutrophils Relative %: 80 %
Platelets: 173 10*3/uL (ref 150–400)
RBC: 5.15 MIL/uL — ABNORMAL HIGH (ref 3.87–5.11)
RDW: 12.3 % (ref 11.5–15.5)
WBC: 5.4 10*3/uL (ref 4.0–10.5)

## 2016-07-12 LAB — BASIC METABOLIC PANEL
ANION GAP: 10 (ref 5–15)
BUN: 7 mg/dL (ref 6–20)
CO2: 24 mmol/L (ref 22–32)
Calcium: 9.3 mg/dL (ref 8.9–10.3)
Chloride: 103 mmol/L (ref 101–111)
Creatinine, Ser: 0.66 mg/dL (ref 0.44–1.00)
GFR calc Af Amer: >60 mL/min (ref >60)
GLUCOSE: 109 mg/dL — AB (ref 65–99)
POTASSIUM: 3.6 mmol/L (ref 3.5–5.1)
Sodium: 137 mmol/L (ref 135–145)

## 2016-07-12 LAB — PREGNANCY, URINE: Preg Test, Ur: NEGATIVE

## 2016-07-12 LAB — GLUCOSE, CAPILLARY: Glucose-Capillary: 151 mg/dL — ABNORMAL HIGH (ref 65–99)

## 2016-07-12 MED ORDER — METOCLOPRAMIDE HCL 5 MG/ML IJ SOLN
10.0000 mg | Freq: Once | INTRAMUSCULAR | Status: AC
  Administered 2016-07-12: 10 mg via INTRAVENOUS
  Filled 2016-07-12: qty 2

## 2016-07-12 MED ORDER — DEXAMETHASONE SODIUM PHOSPHATE 10 MG/ML IJ SOLN
10.0000 mg | Freq: Once | INTRAMUSCULAR | Status: AC
  Administered 2016-07-12: 10 mg via INTRAVENOUS
  Filled 2016-07-12: qty 1

## 2016-07-12 MED ORDER — SODIUM CHLORIDE 0.9 % IV BOLUS (SEPSIS)
1000.0000 mL | Freq: Once | INTRAVENOUS | Status: AC
  Administered 2016-07-12: 1000 mL via INTRAVENOUS

## 2016-07-12 MED ORDER — MORPHINE SULFATE (PF) 2 MG/ML IV SOLN
1.0000 mg | INTRAVENOUS | Status: DC | PRN
  Administered 2016-07-12 – 2016-07-13 (×4): 2 mg via INTRAVENOUS
  Filled 2016-07-12 (×4): qty 1

## 2016-07-12 MED ORDER — IOPAMIDOL (ISOVUE-300) INJECTION 61%
INTRAVENOUS | Status: AC
  Filled 2016-07-12: qty 100

## 2016-07-12 MED ORDER — ONDANSETRON HCL 4 MG/2ML IJ SOLN: 4.0000 mg | Freq: Four times a day (QID) | INTRAMUSCULAR | Status: DC | PRN

## 2016-07-12 MED ORDER — ONDANSETRON HCL 4 MG PO TABS: 4.0000 mg | ORAL_TABLET | Freq: Four times a day (QID) | ORAL | Status: DC | PRN

## 2016-07-12 MED ORDER — INSULIN ASPART 100 UNIT/ML ~~LOC~~ SOLN: 0.0000 [IU] | Freq: Three times a day (TID) | SUBCUTANEOUS | Status: DC

## 2016-07-12 MED ORDER — DEXAMETHASONE 4 MG PO TABS
4.0000 mg | ORAL_TABLET | Freq: Four times a day (QID) | ORAL | Status: DC
  Administered 2016-07-13: 4 mg via ORAL
  Filled 2016-07-12: qty 1

## 2016-07-12 MED ORDER — ACETAMINOPHEN 650 MG RE SUPP: 650.0000 mg | Freq: Four times a day (QID) | RECTAL | Status: DC | PRN

## 2016-07-12 MED ORDER — IOPAMIDOL (ISOVUE-300) INJECTION 61%
100.0000 mL | Freq: Once | INTRAVENOUS | Status: AC | PRN
  Administered 2016-07-12: 100 mL via INTRAVENOUS

## 2016-07-12 MED ORDER — DIPHENHYDRAMINE HCL 50 MG/ML IJ SOLN
25.0000 mg | Freq: Once | INTRAMUSCULAR | Status: AC
  Administered 2016-07-12: 25 mg via INTRAVENOUS
  Filled 2016-07-12: qty 1

## 2016-07-12 MED ORDER — DEXAMETHASONE 2 MG PO TABS: 2.0000 mg | ORAL_TABLET | Freq: Two times a day (BID) | ORAL | Status: DC

## 2016-07-12 MED ORDER — ACETAMINOPHEN 325 MG PO TABS
650.0000 mg | ORAL_TABLET | Freq: Four times a day (QID) | ORAL | Status: DC | PRN
  Filled 2016-07-12: qty 2

## 2016-07-12 MED ORDER — IOPAMIDOL (ISOVUE-300) INJECTION 61%
INTRAVENOUS | Status: AC
  Administered 2016-07-12: 17:00:00
  Filled 2016-07-12: qty 30

## 2016-07-12 MED ORDER — MORPHINE SULFATE (PF) 4 MG/ML IV SOLN
4.0000 mg | Freq: Once | INTRAVENOUS | Status: AC
  Administered 2016-07-12: 4 mg via INTRAVENOUS
  Filled 2016-07-12: qty 1

## 2016-07-12 MED ORDER — GADOBENATE DIMEGLUMINE 529 MG/ML IV SOLN
15.0000 mL | Freq: Once | INTRAVENOUS | Status: AC | PRN
  Administered 2016-07-12: 15 mL via INTRAVENOUS

## 2016-07-12 NOTE — ED Notes (Signed)
Attempted report 

## 2016-07-12 NOTE — ED Notes (Signed)
Pt states she has been having mirgaines now for 2 weeks. Pt states she is supposed to have a CT scan done at [facility] today but could not take the pain anymore. Pt states she has taken multiple medications both over the counter and prescription.

## 2016-07-12 NOTE — Consult Note (Signed)
CC:  Chief Complaint  Patient presents with  . Migraine    HPI: Kaitlyn Keith is a 51 y.o. female who presented to the ER for headache x2 weeks. States headache is mostly in the occiput without radiation. Describes it as a dull, pressure sensation. History of headaches in the past, but this is the worst that it has ever been. Endorses blurry vision intermittently, but no visual changes today. Endorses nausea without vomiting. Issues with balance. States feels as though she is always going to fall to the left. Endorses RUE weakness (chronic neuropathy from breast CA tx). Went to PCP for [facility] and was prescribed relpax without relief. Was also taking ibuprofen, aleve, BP medications without relief. History of breast cancer with radiation and chemo last year. Not currently on any treatment for breast cancer. Denies dizziness, slurring speech, facial droop.  PMH: Past Medical History:  Diagnosis Date  . Anxiety   . Arthritis   . Back pain   . Breast cancer ([place])   . Breast cancer of lower-outer quadrant of right female breast ([place]) 12/05/2014  . Depression   . Hot flashes   . Hypertension   . 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 Radiation 07/23/15-09/05/15   right breast 50.4 Gy, boost of 10 Gy    PSH: Past Surgical History:  Procedure Laterality Date  . BREAST LUMPECTOMY WITH NEEDLE LOCALIZATION AND AXILLARY SENTINEL LYMPH NODE BX Right 06/16/2015   Procedure: BREAST LUMPECTOMY WITH NEEDLE LOCALIZATION AND AXILLARY SENTINEL LYMPH NODE BX;  Surgeon: [name], MD;  Location: [place];  Service: General;  Laterality: Right;  . BREAST REDUCTION SURGERY    . PORT-A-CATH REMOVAL N/A 06/16/2015   Procedure: REMOVAL PORT-A-CATH;  Surgeon: [name], MD;  Location: [place];  Service: General;  Laterality: N/A;  . PORTACATH PLACEMENT N/A 12/23/2014   Procedure: INSERTION PORT-A-CATH;  Surgeon: [name], MD;  Location: [place];  Service: General;  Laterality: N/A;    SH: Social History  Substance Use Topics  . Smoking status: Never Smoker  . Smokeless tobacco: Never Used  . Alcohol use Yes     Comment: social    MEDS: Prior to Admission medications   Medication Sig Start Date End Date Taking? Authorizing Provider  ALPRAZolam Duanne Moron) 1 MG tablet Take 2 tablets (2 mg total) by mouth at bedtime. Patient taking differently: Take 2 mg by mouth at bedtime as needed for anxiety or sleep.  10/21/15  Yes [name], MD  DULoxetine (CYMBALTA) 20 MG capsule Take 2 capsules (40 mg total) by mouth daily. Patient taking differently: Take 40 mg by mouth daily as needed (pain).  09/19/15  Yes [name], MD  gabapentin (NEURONTIN) 400 MG capsule Take 400 mg (1tab) PO TID Patient taking differently: Take 400 mg by mouth 3 (three) times daily as needed (pain). Take 400 mg (1tab) PO TID 03/09/16  Yes [name], MD  lisinopril-hydrochlorothiazide (PRINZIDE,ZESTORETIC) 20-12.5 MG tablet Take 1 tablet by mouth 2 (two) times daily. Patient taking differently: Take 1 tablet by mouth daily.  03/25/15  Yes [name], MD  LORazepam (ATIVAN) 1 MG tablet TAKE ONE TABLET BY MOUTH EVERY 6 HOURS AS NEEDED FOR NAUSEA AND VOMITING 03/09/16  Yes [name], MD  oxyCODONE-acetaminophen (PERCOCET/ROXICET) 5-325 MG tablet Take 1-2 tablets by mouth every 6 (six) hours as needed for severe pain. 08/04/15  Yes [name], MD  traMADol Veatrice Bourbon)  50 MG tablet Take 1 tablet (50 mg total) by mouth 2 (two) times daily. Patient taking differently: Take 50 mg by mouth every 12 (twelve) hours as needed for moderate pain.  08/13/15  Yes [name], MD  Vitamin D, Ergocalciferol, (DRISDOL) 50000 UNITS CAPS capsule Take 1 capsule (50,000 Units total) by mouth every 7 (seven) days. 03/25/15  Yes [name], MD    ALLERGY: No Known Allergies  ROS: Review of Systems  Constitutional: Positive for malaise/fatigue. Negative for  chills, diaphoresis, fever and weight loss.  HENT: Negative.   Eyes: Positive for blurred vision (intermittently).  Respiratory: Negative.   Cardiovascular: Negative.   Gastrointestinal: Negative.   Genitourinary: Negative.   Skin: Negative for itching and rash.  Neurological: Positive for weakness and headaches. Negative for dizziness, tingling, tremors, sensory change, speech change, seizures and loss of consciousness.  Endo/Heme/Allergies: Negative for environmental allergies and polydipsia. Bruises/bleeds easily.  Psychiatric/Behavioral: Negative.     NEUROLOGIC EXAM: Awake, alert, oriented Memory and concentration grossly intact Speech fluent, appropriate CN grossly intact Motor exam: Upper Extremities Deltoid Bicep Tricep Grip  Right 4+/5 4+/5 4+/5 4+/5  Left 5/5 5/5 5/5 5/5   Lower Extremity IP Quad PF DF EHL  Right 5/5 5/5 5/5 5/5 5/5  Left 5/5 5/5 5/5 5/5 5/5  Sensation grossly intact to LT  IMAGING: MRI showing:  IMPRESSION: 1. 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 Moderate vasogenic edema. Prominent narrowing of the fourth ventricle without lateral ventriculomegaly. 2. Findings of remote, displaced C2 fracture. If this is a previously unknown finding, noncontrast cervical spine CT is recommended.  IMPRESSION: - 51 y.o. female with cerebellar mass and remote displaced C2 fracture. - Neurologically intact with the exception of chronic weakness RUE from neuropathy.   PLAN: - Admit to hospitality service to identify primary - will continue to monitor pt from neurosurgical standpoint - obtain CT C-spine w/o contrast - CT abd/pelvis & chest with contrast

## 2016-07-12 NOTE — ED Provider Notes (Signed)
[facility] DEPT Provider Note   CSN: [id number] Arrival date & time: 07/12/16  [id number]     History   Chief Complaint Chief Complaint  Patient presents with  . Migraine    HPI Kaitlyn Keith is a 51 y.o. female.  She has had an occipital headache for the last 2 weeks. It is getting worse but she currently rates it at 8/10. It is described as a pressure feeling. It is worse with exposure to light or noise. There has been some associated visual blurring and some nausea but no vomiting. She feels generally weak but not has not had any focal weakness or numbness. She saw her PCP who gave her an injection but it did not help. She had a similar headache about one year ago, but this headache is worse. She recently completed radiation therapy and chemotherapy for breast cancer. She's not currently on any treatment for the breast cancer.   The history is provided by the patient.  Migraine     Past Medical History:  Diagnosis Date  . Anxiety   . Arthritis   . Back pain   . [place])   . Breast cancer of lower-outer quadrant of right female breast ([place]) 12/05/2014  . Depression   . Hot flashes   . Hypertension   . 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 Radiation 07/23/15-09/05/15   right breast 50.4 Gy, boost of 10 Gy    Patient Active Problem List   Diagnosis Date Noted  . Degenerative spinal arthritis 11/18/2015  . 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 Paronychia of great toe, left 02/04/2015  . Breast cancer of lower-outer quadrant of right female breast ([place]) 12/05/2014    Past Surgical History:  Procedure Laterality Date  . BREAST LUMPECTOMY WITH NEEDLE LOCALIZATION AND AXILLARY SENTINEL LYMPH NODE BX Right 06/16/2015   Procedure: BREAST LUMPECTOMY WITH NEEDLE LOCALIZATION AND AXILLARY SENTINEL LYMPH NODE BX;  Surgeon: [name], MD;  Location: [facility];  Service: General;  Laterality:  Right;  . BREAST REDUCTION SURGERY    . PORT-A-CATH REMOVAL N/A 06/16/2015   Procedure: REMOVAL PORT-A-CATH;  Surgeon: [name], MD;  Location: [facility];  Service: General;  Laterality: N/A;  . PORTACATH PLACEMENT N/A 12/23/2014   Procedure: INSERTION PORT-A-CATH;  Surgeon: [name], MD;  Location: [facility];  Service: General;  Laterality: N/A;    OB History    No data available       Home Medications    Prior to Admission medications   Medication Sig Start Date End Date Taking? Authorizing Provider  ALPRAZolam Duanne Moron) 1 MG tablet Take 2 tablets (2 mg total) by mouth at bedtime. 10/21/15   [name], MD  DULoxetine (CYMBALTA) 20 MG capsule Take 2 capsules (40 mg total) by mouth daily. 09/19/15   [name], MD  gabapentin (NEURONTIN) 400 MG capsule Take 400 mg (1tab) PO TID 03/09/16   [name], MD  hyaluronate sodium (RADIAPLEXRX) GEL Apply 1 application topically 2 (two) times daily. Reported on 11/04/2015    Historical Provider, MD  lisinopril-hydrochlorothiazide (PRINZIDE,ZESTORETIC) 20-12.5 MG tablet Take 1 tablet by mouth 2 (two) times daily. 03/25/15   [name], MD  lisinopril-hydrochlorothiazide (PRINZIDE,ZESTORETIC) 20-12.5 MG tablet TAKE ONE TABLET BY MOUTH TWICE DAILY 04/30/16   [name], MD  LORazepam (ATIVAN) 1 MG tablet TAKE ONE TABLET BY MOUTH EVERY 6 HOURS AS NEEDED FOR NAUSEA AND  VOMITING 03/09/16   [name], MD  non-metallic deodorant Jethro Poling) MISC Apply 1 application topically daily as needed. Reported on 11/04/2015    Historical Provider, MD  oxyCODONE-acetaminophen (PERCOCET/ROXICET) 5-325 MG tablet Take 1-2 tablets by mouth every 6 (six) hours as needed for severe pain. 08/04/15   [name], MD  sertraline (ZOLOFT) 50 MG tablet  08/02/15   Historical Provider, MD  traMADol (ULTRAM) 50 MG tablet Take 1 tablet (50 mg total) by mouth 2 (two) times daily. 08/13/15   [name], MD  vitamin B-12 (CYANOCOBALAMIN) 250  MCG tablet Take 500 mcg by mouth daily.    Historical Provider, MD  Vitamin D, Ergocalciferol, (DRISDOL) 50000 UNITS CAPS capsule Take 1 capsule (50,000 Units total) by mouth every 7 (seven) days. 03/25/15   [name], MD    Family History No family history on file.  Social History Social History  Substance Use Topics  . Smoking status: Never Smoker  . Smokeless tobacco: Never Used  . Alcohol use Yes     Comment: social     Allergies   Patient has no known allergies.   Review of Systems Review of Systems  All other systems reviewed and are negative.    Physical Exam Updated Vital Signs BP (!) 165/108 (BP Location: Left Arm)   Pulse 78   Temp 98.4 F (36.9 C) (Oral)   Resp 18   Ht 5\' 3"  (1.6 m)   Wt 183 lb (83 kg)   LMP 05/01/2016 (Exact Date)   SpO2 100%   BMI 32.42 kg/m   Physical Exam  Nursing note and vitals reviewed.  51 year old female, resting comfortably and in no acute distress. Vital signs are significant for hypertension. Oxygen saturation is 100%, which is normal. Head is normocephalic and atraumatic. PERRLA, EOMI. Oropharynx is clear. Fundi show no hemorrhage, exudate, or papilledema. There is no tenderness to palpation over the temporalis muscles. There is moderate tenderness over insertion of the paracervical muscles. Neck is supple without adenopathy or JVD. Back is nontender and there is no CVA tenderness. Lungs are clear without rales, wheezes, or rhonchi. Chest is nontender. Heart has regular rate and rhythm without murmur. Abdomen is soft, flat, nontender without masses or hepatosplenomegaly and peristalsis is normoactive. Extremities have no cyanosis or edema, full range of motion is present. Skin is warm and dry without rash. Neurologic: Mental status is normal, cranial nerves are intact, there are no motor or sensory deficits.  ED Treatments / Results  Labs (all labs ordered are listed, but only abnormal results are displayed) Labs  Reviewed  BASIC METABOLIC PANEL - Abnormal; Notable for the following:       Result Value   Glucose, Bld 109 (*)    All other components within normal limits  CBC WITH DIFFERENTIAL/PLATELET - Abnormal; Notable for the following:    RBC 5.15 (*)    Hemoglobin 15.3 (*)    All other components within normal limits    Radiology Ct Head Wo Contrast  Result Date: 07/12/2016 CLINICAL DATA:  51 year old hypertensive female with history of breast cancer (post radiation therapy) presenting with 2 weeks of headaches worse over the past 2 days. Initial encounter. EXAM: CT HEAD WITHOUT CONTRAST TECHNIQUE: Contiguous axial images were obtained from the base of the skull through the vertex without intravenous contrast. COMPARISON:  None. FINDINGS: Brain: Hypodensity within the right cerebellum with local mass effect, compressing the fourth ventricle and displacing the pons anteriorly with minimal downward displacement of the  cerebellar tonsils. Although a subacute infarct is a possibility, the additional finding of 1.5 cm rounded hypodensity within the peripheral aspect right cerebellum suggests that findings may be related to metastatic disease. Contrast-enhanced MR recommended for further delineation. The right temporal horn is slightly larger than the left. This may be normal for this patient or represent result of early hydrocephalus. Patient is at risk for development of hydrocephalus given the mass effect upon the fourth ventricle. No intracranial hemorrhage. Vascular: No hyperdense vessel. Skull: No worrisome destructive lesion. Tiny lucencies may represent arachnoid granulations. Sinuses/Orbits: Tiny radiopaque structure right optic nerve has an appearance more suggestive of calcification/minimal deposition than metallic foreign body. This was discussed with Dr. Roxanne Mins. Minimal polypoid opacification left maxillary sinus. Minimal mucosal thickening ethmoid sinus air cells. Other: Negative IMPRESSION:  Abnormality right cerebellum raises possibly of metastatic disease with surrounding vasogenic edema and local mass effect with compression fourth ventricle placing the patient at risk for development of hydrocephalus. Subacute infarct is a secondary less likely consideration. Contrast-enhanced MR recommended for further delineation. Please see above for further detail. These results were called by telephone at the time of interpretation on 07/12/2016 at 7:17 am to Dr. Delora Fuel , who verbally acknowledged these results. Electronically Signed   By: Genia Del M.D.   On: 07/12/2016 07:28    Procedures Procedures (including critical care time)  Medications Ordered in ED Medications  morphine 4 MG/ML injection 4 mg (not administered)  dexamethasone (DECADRON) injection 10 mg (not administered)  sodium chloride 0.9 % bolus 1,000 mL (1,000 mLs Intravenous New Bag/Given 07/12/16 0626)  metoCLOPramide (REGLAN) injection 10 mg (10 mg Intravenous Given 07/12/16 0627)  diphenhydrAMINE (BENADRYL) injection 25 mg (25 mg Intravenous Given 07/12/16 [id number])     Initial Impression / Assessment and Plan / ED Course  I have reviewed the triage vital signs and the nursing notes.  Pertinent labs & imaging results that were available during my care of the patient were reviewed by me and considered in my medical decision making (see chart for details).I have personally discussed the CT findings with radiologist.  Headache with physical findings suggestive of muscle contraction headache. Hypertension which I believe is secondary to her headache and not causing her headache. However, she has no prior ED visits for headaches. Will check CT scan of head and also give her a migraine cocktail of normal saline, diphenhydramine, metoclopramide.  She had no relief of headache with above noted treatment. CT shows cerebellar lesion with some mass effect. This is worrisome for metastasis. She'll be given morphine for pain and  dose of dexamethasone and is being sent for MRI of the brain with and without contrast. Case is signed out to Dr. Maryan Rued.  Final Clinical Impressions(s) / ED Diagnoses   Final diagnoses:  Bad headache  Cerebellar mass    New Prescriptions New Prescriptions   No medications on file     Delora Fuel, MD [id number]

## 2016-07-12 NOTE — ED Notes (Signed)
Delay in lab draw, edp in with pt 

## 2016-07-12 NOTE — ED Notes (Signed)
Admitting at bedside 

## 2016-07-12 NOTE — H&P (Signed)
Date: 07/12/2016               Patient Name:  Kaitlyn Keith MRN: [id number]  DOB: Nov 16, 1965 Age / Sex: 51 y.o., female   PCP: Secundino Ginger, PA-C         Medical Service: Internal Medicine Teaching Service         Attending Physician: Dr. Aldine Contes, MD    First Contact: Dr. Lovena Le Pager: [phone]  Second Contact: Dr. 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   Since December, she has not felt well ["hot flashes", cough] and was treated with multiple courses of Z-packs from her PCP. Her symptoms progressed to a left-sided headache with blurry vision, nausea, dizziness which were relieved with an injection of medication administered at her PCP's office six days ago. Her symptoms persisted, and she was prescribed eletriptan which did not provide relief. She was scheduled for a head CT earlier today but presented here because of the severity of her symptoms. She denies falls, syncope, focal weakness.  She recalls being diagnosed with breast cancer in 2015 though her chart notes 2016. She recalls undergoing chemotherapy [adriamycin and cyclophosphamide x 4 cycles, paclitaxel x 12 cycles] from August to December followed by XRT which concluded in April 2017, just prior to her son's graduation. She last followed up with her oncologist, Dr. Lindi Adie, in March 2017. She is the youngest of four sisters, one of which was diagnosed with a gynecologic cancer in her 48s-50s but is doing well now, but she denies any other individuals with breast or gynecologic cancer.   In the ED, brain MRI noted a 21 x 24 x 17 mm  right cerebellar mass with moderate vasogenic edema and prominent narrowing of the fourth ventricle and remote, displaced C2 fracture. She received dexamethasone 10 mg IV, metoclopramide 10 mg, morphine 4 mg, normal saline 1L bolus.  Neurosurgery was consulted and recommended CT scan of the cervical spine as well as workup for metastatic disease.  Meds:  Current Meds  Medication Sig  . ALPRAZolam (XANAX) 1 MG tablet Take 2 tablets (2 mg total) by mouth at bedtime. (Patient taking differently: Take 2 mg by mouth at bedtime as needed for anxiety or sleep. )  . DULoxetine (CYMBALTA) 20 MG capsule Take 2 capsules (40 mg total) by mouth daily. (Patient taking differently: Take 40 mg by mouth daily as needed (pain). )  . gabapentin (NEURONTIN) 400 MG capsule Take 400 mg (1tab) PO TID (Patient taking differently: Take 400 mg by mouth 3 (three) times daily as needed (pain). Take 400 mg (1tab) PO TID)  . lisinopril-hydrochlorothiazide (PRINZIDE,ZESTORETIC) 20-12.5 MG tablet Take 1 tablet by mouth 2 (two) times daily. (Patient taking differently: Take 1 tablet by mouth daily. )  . LORazepam (ATIVAN) 1 MG tablet TAKE ONE TABLET BY MOUTH EVERY 6 HOURS AS NEEDED FOR NAUSEA AND VOMITING  . oxyCODONE-acetaminophen (PERCOCET/ROXICET) 5-325 MG tablet Take 1-2 tablets by mouth every 6 (six) hours as needed for severe pain.  . traMADol (ULTRAM) 50 MG tablet Take 1 tablet (50 mg total) by mouth 2 (two) times daily. (Patient taking  differently: Take 50 mg by mouth every 12 (twelve) hours as needed for moderate pain. )  . Vitamin D, Ergocalciferol, (DRISDOL) 50000 UNITS CAPS capsule Take 1 capsule (50,000 Units total) by mouth every 7 (seven) days.     Allergies: Allergies as of 07/12/2016  . (No Known Allergies)   Past Medical History:  Diagnosis Date  . Anxiety   . Arthritis   . Back pain   . Breast cancer ([place])   . Breast cancer of lower-outer quadrant of right female breast ([place]) 12/05/2014  . Depression     . Hot flashes   . Hypertension   . 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 Radiation 07/23/15-09/05/15   right breast 50.4 Gy, boost of 10 Gy    Family History:  As noted in the HPI.  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 Engaged. Drinks 3 glasses of wine/3 nights a week. No ongoing illicit drug use or tobacco use.  Review of Systems: A complete ROS was negative except as per HPI.   Physical Exam: Blood pressure 141/86, pulse 92, temperature 98.4 F (36.9 C), temperature source Oral, resp. rate 18, height '5\' 3"'  (1.6 m), weight 183 lb (83 kg), last menstrual period 05/01/2016, SpO2 99 %.  Physical Exam  Constitutional: She is oriented to person, place, and time. No distress.  HENT:  Head: Normocephalic and atraumatic.  Eyes: Conjunctivae are normal. Pupils are equal, round, and reactive to light. No scleral icterus.  Cardiovascular: Normal rate and regular rhythm.   Pulmonary/Chest: Effort normal. No respiratory distress.  Neurological: She is alert and oriented to person, place, and time. She displays normal reflexes. No cranial nerve deficit. Coordination normal.  Skin: Skin is warm and dry. She is not diaphoretic.  Psychiatric: Affect and judgment normal.    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  Right cerebellar mass: As noted on imaging. Suspect her breast cancer as the primary lesion though will workup for other metastatic disease.  -Order CT chest/abdomen/pelvis with contrast  -Follow-up CT C-spine w/o contrast -Continue  dexamethasone 2 mg twice daily for cerebral edema. Order sliding scale insulin of sensitive intensity.  -Follow-up recommendations from her oncologist, Dr. Lindi Adie -Neurosurgery following, appreciate recommendations  Hypertension: Normotensive. Holding home lisinopril/HCTZ given IV contrast for imaging noted above.  #FEN:  -Diet: Regular  #DVT prophylaxis: SCDs  #CODE STATUS: FULL CODE -Defer to sister Leane Call [[phone]] if patients lacks decision-making capacity -Confirmed with patient on admission  Dispo: Admit patient to Inpatient with expected length of stay greater than 2 midnights.  Signed: Riccardo Dubin, MD 07/12/2016, 1:26 PM  Pager: [phone]

## 2016-07-12 NOTE — ED Triage Notes (Signed)
Pt states she has been having mirgaines now for 2 weeks. Pt states she is supposed to have a CT scan done at [facility] today but could not take the pain anymore. Pt states she has taken multiple medications both over the counter and prescription.

## 2016-07-12 NOTE — ED Notes (Signed)
Patient transported to MRI 

## 2016-07-13 ENCOUNTER — Other Ambulatory Visit: Payer: Self-pay | Admitting: *Deleted

## 2016-07-13 ENCOUNTER — Other Ambulatory Visit: Payer: Self-pay | Admitting: Radiation Therapy

## 2016-07-13 DIAGNOSIS — S12100A Unspecified displaced fracture of second cervical vertebra, initial encounter for closed fracture: Secondary | ICD-10-CM

## 2016-07-13 DIAGNOSIS — C50511 Malignant neoplasm of lower-outer quadrant of right female breast: Secondary | ICD-10-CM

## 2016-07-13 DIAGNOSIS — X58XXXA Exposure to other specified factors, initial encounter: Secondary | ICD-10-CM | POA: Diagnosis not present

## 2016-07-13 DIAGNOSIS — I10 Essential (primary) hypertension: Secondary | ICD-10-CM | POA: Diagnosis not present

## 2016-07-13 DIAGNOSIS — G9389 Other specified disorders of brain: Secondary | ICD-10-CM | POA: Diagnosis not present

## 2016-07-13 DIAGNOSIS — C7931 Secondary malignant neoplasm of brain: Secondary | ICD-10-CM

## 2016-07-13 DIAGNOSIS — C7949 Secondary malignant neoplasm of other parts of nervous system: Principal | ICD-10-CM

## 2016-07-13 DIAGNOSIS — Z171 Estrogen receptor negative status [ER-]: Principal | ICD-10-CM

## 2016-07-13 LAB — GLUCOSE, CAPILLARY
Glucose-Capillary: 102 mg/dL — ABNORMAL HIGH (ref 65–99)
Glucose-Capillary: 109 mg/dL — ABNORMAL HIGH (ref 65–99)

## 2016-07-13 LAB — HIV ANTIBODY (ROUTINE TESTING W REFLEX): HIV Screen 4th Generation wRfx: NONREACTIVE

## 2016-07-13 MED ORDER — WHITE PETROLATUM GEL
Status: AC
  Administered 2016-07-13: 07:00:00
  Filled 2016-07-13: qty 1

## 2016-07-13 MED ORDER — DEXAMETHASONE 4 MG PO TABS: 4.0000 mg | ORAL_TABLET | Freq: Three times a day (TID) | ORAL | 0 refills | Status: DC

## 2016-07-13 MED ORDER — OXYCODONE-ACETAMINOPHEN 5-325 MG PO TABS: 1.0000 | ORAL_TABLET | Freq: Four times a day (QID) | ORAL | 0 refills | Status: DC | PRN

## 2016-07-13 MED ORDER — OXYCODONE-ACETAMINOPHEN 5-325 MG PO TABS: 1.0000 | ORAL_TABLET | Freq: Four times a day (QID) | ORAL | Status: DC | PRN

## 2016-07-13 NOTE — Progress Notes (Signed)
Patient was counseled that the Percocet may make her drowsy and not to drive while on this medication. While on Percocet, she was counseled to make sure she had regular bowel movements. Also, the steroid she will be discharged on may cause her blood pressure and blood glucose to increase. She was advised to take the steroid in the morning with food to try to prevent upset stomach and insomnia.   Quincy Sheehan and Wille Glaser PharmD Candidates 2018 [place]

## 2016-07-13 NOTE — Progress Notes (Signed)
Doing well Answered questions about radiosurgery and surgery Ok for d/c from our perspective Dexamethasone 4mg  PO q8

## 2016-07-13 NOTE — Discharge Summary (Signed)
Name: Kaitlyn Keith MRN: [id number] DOB: January 06, 1966 51 y.o. PCP: Secundino Ginger, PA-C  Date of Admission: 07/12/2016  5:23 AM Date of Discharge: 07/13/2016 Attending Physician: Aldine Contes, MD  Discharge Diagnosis: 1. Cerebellar mass Principal Problem:   Cerebellar mass Active Problems:   Breast cancer of lower-outer quadrant of right female breast ([place])   C2 cervical fracture ([facility])   Hypertension   Discharge Medications: Allergies as of 07/13/2016   No Known Allergies     Medication List    STOP taking these medications   traMADol 50 MG tablet Commonly known as:  ULTRAM     TAKE these medications   ALPRAZolam 1 MG tablet Commonly known as:  XANAX Take 2 tablets (2 mg total) by mouth at bedtime. What changed:  when to take this  reasons to take this   dexamethasone 4 MG tablet Commonly known as:  DECADRON Take 1 tablet (4 mg total) by mouth 3 (three) times daily.   DULoxetine 20 MG capsule Commonly known as:  CYMBALTA Take 2 capsules (40 mg total) by mouth daily. What changed:  when to take this  reasons to take this   gabapentin 400 MG capsule Commonly known as:  NEURONTIN Take 400 mg (1tab) PO TID What changed:  how much to take  how to take this  when to take this  reasons to take this  additional instructions   lisinopril-hydrochlorothiazide 20-12.5 MG tablet Commonly known as:  PRINZIDE,ZESTORETIC Take 1 tablet by mouth 2 (two) times daily. What changed:  when to take this   LORazepam 1 MG tablet Commonly known as:  ATIVAN TAKE ONE TABLET BY MOUTH EVERY 6 HOURS AS NEEDED FOR NAUSEA AND VOMITING   oxyCODONE-acetaminophen 5-325 MG tablet Commonly known as:  PERCOCET/ROXICET Take 1-2 tablets by mouth every 6 (six) hours as needed for moderate pain or severe pain (1 for moderate pain and 2 for severe pain). What changed:  reasons to take this   Vitamin D (Ergocalciferol) 50000 units Caps capsule Commonly known as:   DRISDOL Take 1 capsule (50,000 Units total) by mouth every 7 (seven) days.       Disposition and follow-up:   Ms.Kaitlyn Keith was discharged from [facility] in Good condition.  At the hospital follow up visit please address:  1.  Ensure she follows-up with oncology and neurosurgery.  2.  Labs / imaging needed at time of follow-up: None per primary team.  3.  Pending labs/ test needing follow-up: None per primary team.  Follow-up Appointments:   Hospital Course by problem list: Principal Problem:   Cerebellar mass Active Problems:   Breast cancer of lower-outer quadrant of right female breast ([place])   C2 cervical fracture ([place])   Hypertension   1. Cerebellar mass The patient presented to the [facility] emergency department on 07/12/2016 with worsening headache and blurry vision. She was also complaining of overall general malaise and fatigue over the previous several weeks. Importantly the patient has a history of breast cancer and is status post chemotherapy, lumpectomy and XRT. In the emergency department the patient was afebrile and hemodynamically stable. MRI brain noted a 21 x 24 x 17 mm right cerebellar mass with moderate vasogenic edema. She was given 10 mg IV dexamethasone, 10 mg metoclopramide, a 1 L normal saline bolus and morphine. Neurosurgery was consulted and recommended CT scan of the cervical spine as well as imaging for further workup. She was then admitted to the [facility] internal medicine  teaching service for further workup and management. Once admitted to the service the patient had a CT chest, abdomen and pelvis with contrast which showed no acute abnormality within the chest, abdomen or pelvis. Minimal bilateral dependent subsegmental atelectasis noted. Scattered nonspecific hypodensities within the liver measuring up to 1 cm in size the larger of which was stable from 2006. CT cervical spine showed solid fusion of grade 1 anteriolisthesis of  C2-3 with anterior displacement of the C1 ring. No acute fracture. Over her single night of admission her headache and blurry vision improved. She was treated with Decadron. She was discharged on 4 mg Decadron every 8 hours. She has follow-up with neurosurgery and radiation oncology. She will have stereotactic radiosurgery followed by craniotomy and tumor resection. At the time of discharge the patient was afebrile and hemodynamically stable. She understood the findings of her imaging and all of her questions were answered appropriately.  Discharge Vitals:   BP (!) 142/95 (BP Location: Left Arm)   Pulse 66   Temp 98.2 F (36.8 C) (Oral)   Resp 20   Ht 5\' 3"  (1.6 m)   Wt 183 lb (83 kg)   LMP 05/01/2016 (Exact Date)   SpO2 98%   BMI 32.42 kg/m   Pertinent Labs, Studies, and Procedures:  1. 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 CT chest abdomen and pelvis-no acute abnormalities in the chest, abdomen or pelvis 3. CT cervical spine-solid fusion of grade 1 anterolisthesis at C2-3 with anterior displacement of the C1 ring  Discharge Instructions: Discharge Instructions    Diet - low sodium heart healthy    Complete by:  As directed    Discharge instructions    Complete by:  As directed    Please take your medication as prescribed.  We will send you home on steroids which you are to take 3 times daily.  Also, you have follow-up appointments scheduled with neurosurgery and radiation oncology. Please ensure you make these appointments.   I have also prescribed pain medication for you. You're to take 1 pill for moderate pain and 2 pills for severe pain. Also, as you know steroids may cause you to feel anxious over the next several days. This will be a normal side effect of the steroids.   Increase activity slowly    Complete by:  As directed       Signed: Ophelia Shoulder, MD 07/13/2016, 2:57 PM   Pager: [phone]

## 2016-07-13 NOTE — Progress Notes (Signed)
   Subjective: No acute events overnight. Patient says she didn't sleep well and this was a combination of anxiety, headache and neck pain. Overall, this morning she was happy to hear that the majority of her imaging did not find other concerning lesions. We discussed with her that she will have follow-up with neurosurgery and radiation oncology for ongoing management in the outpatient setting. She had several questions and we answered these to her satisfaction. She had no additional complaints today. Overall, she says she is improved since presentation. She continues to have some headache and neck pain.  Objective:  Vital signs in last 24 hours: Vitals:   07/12/16 2100 07/13/16 0254 07/13/16 0607 07/13/16 1025  BP: (!) 134/99 (!) 155/98 (!) 159/98 (!) 159/100  Pulse: 85 65 79 76  Resp: _0 Temp: 98 F (36.7 C) 98.3 F (36.8 C) 98.2 F (36.8 C) 98.4 F (36.9 C)  TempSrc: Oral Oral Oral Oral  SpO2: 98% 97% 94% 99%  Weight:      Height:       Physical Exam  Constitutional: She is oriented to person, place, and time. She appears well-developed and well-nourished.  HENT:  Head: Normocephalic and atraumatic.  Cardiovascular: Normal rate and regular rhythm.  Exam reveals no gallop and no friction rub.   No murmur heard. Respiratory: Effort normal and breath sounds normal. No respiratory distress.  Musculoskeletal: She exhibits no edema.  Neurological: She is alert and oriented to person, place, and time.     Assessment/Plan:  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 The mass is 24 mm and  is located in the right cerebellum. There is moderate vasogenic edema associated with this mass and narrowing of the fourth ventricle but without ventriculomegaly. Given her history of breast cancer she then underwent a CT chest abdomen and pelvis with contrast and a CT cervical spine without contrast. The above imaging modalities did not show other new concerning lesions. CT abdomen and pelvis with contrast did show scattered nonspecific hypodensities within the liver with the larger stable since 2006. Neurosurgery evaluated the patient and has scheduled her for outpatient stereotactic radiosurgery with radiation oncology followed by craniotomy for tumor resection. For symptom management she was started on Decadron and Norco for pain control. She is currently stable and will be discharged this afternoon with follow-up with radiation oncology and neurosurgery. -- Follow-up with radiation oncology for stereotactic radiosurgery -- Follow-up with neurosurgery for craniotomy and resection of tumor -- Continue Decadron 4 mg twice a day --Percocet every 6 hours when necessary pain   # Hypertension: Currently hypertensive. I suspect her blood pressure will be slightly more difficult to control as she is on steroids for vasogenic edema. We'll restart her home antihypertensive medications today. -- Restart antihypertensive medications  #FEN:  -Diet: Regular  #DVT prophylaxis: SCDs  #CODE STATUS: FULL CODE -Defer to sister Kaitlyn Keith [phone]] if patients lacks decision-making capacity -Confirmed with patient on admission  Dispo: Anticipated discharge today.  Ophelia Shoulder, MD 07/13/2016, 12:31 PM Pager: [phone]

## 2016-07-13 NOTE — Care Management Note (Signed)
Case Management Note  Patient Details  Name: Kaitlyn Keith MRN: [id number] Date of Birth: [birth year]  Subjective/Objective:                    Action/Plan: Pt discharged home with self care. Pt with insurance and PCP. No needs per CM.  Expected Discharge Date:  07/13/16               Expected Discharge Plan:  Home/Self Care  In-House Referral:     Discharge planning Services     Post Acute Care Choice:    Choice offered to:     DME Arranged:    DME Agency:     HH Arranged:    HH Agency:     Status of Service:  Completed, signed off  If discussed at [employer] of Stay Meetings, dates discussed:    Additional Comments:  Pollie Friar, RN 07/13/2016, 4:11 PM

## 2016-07-13 NOTE — Progress Notes (Signed)
Spoke to pt and discussed she will f/u with Dr. Lindi Adie after sx. Relayed will order and schedule bilateral mm. Denies further needs at this time. Gave emotional support and encouragement.

## 2016-07-13 NOTE — Progress Notes (Signed)
Patient discharged home with family. Discharge information and prescription given. IV removed. Patient questions asked and answered. Patient satisfied with face-to-face time with her physicians. Will transport from unit via wheelchair. Wendee Copp

## 2016-07-16 NOTE — Progress Notes (Signed)
Received fax from [place] regarding mm. Normal. Sent to scan

## 2016-07-17 ENCOUNTER — Ambulatory Visit
Admit: 2016-07-17 | Discharge: 2016-07-17 | Disposition: A | Discharge status: Home / Self Care | Payer: BLUE CROSS/BLUE SHIELD | Attending: Radiation Oncology | Admitting: Radiation Oncology

## 2016-07-17 DIAGNOSIS — C7949 Secondary malignant neoplasm of other parts of nervous system: Principal | ICD-10-CM

## 2016-07-17 DIAGNOSIS — C7931 Secondary malignant neoplasm of brain: Secondary | ICD-10-CM

## 2016-07-17 MED ORDER — GADOBENATE DIMEGLUMINE 529 MG/ML IV SOLN
15.0000 mL | Freq: Once | INTRAVENOUS | Status: AC | PRN
  Administered 2016-07-17: 15 mL via INTRAVENOUS

## 2016-07-19 ENCOUNTER — Ambulatory Visit
Admission: RE | Admit: 2016-07-19 | Discharge: 2016-07-19 | Disposition: A | Discharge status: Home / Self Care | Payer: BLUE CROSS/BLUE SHIELD | Source: Ambulatory Visit | Attending: Radiation Oncology | Admitting: Radiation Oncology

## 2016-07-19 ENCOUNTER — Encounter: Payer: Self-pay | Admitting: Radiation Oncology

## 2016-07-19 ENCOUNTER — Telehealth: Payer: Self-pay | Admitting: Hematology and Oncology

## 2016-07-19 VITALS — BP 145/107 | HR 63 | Temp 98.2°F | Resp 18 | Wt 183.6 lb

## 2016-07-19 DIAGNOSIS — Z9889 Other specified postprocedural states: Secondary | ICD-10-CM | POA: Diagnosis not present

## 2016-07-19 DIAGNOSIS — I10 Essential (primary) hypertension: Secondary | ICD-10-CM | POA: Insufficient documentation

## 2016-07-19 DIAGNOSIS — N951 Menopausal and female climacteric states: Secondary | ICD-10-CM | POA: Insufficient documentation

## 2016-07-19 DIAGNOSIS — C7931 Secondary malignant neoplasm of brain: Secondary | ICD-10-CM

## 2016-07-19 DIAGNOSIS — G620 Drug-induced polyneuropathy: Secondary | ICD-10-CM | POA: Insufficient documentation

## 2016-07-19 DIAGNOSIS — Z853 Personal history of malignant neoplasm of breast: Secondary | ICD-10-CM | POA: Insufficient documentation

## 2016-07-19 DIAGNOSIS — C7949 Secondary malignant neoplasm of other parts of nervous system: Secondary | ICD-10-CM | POA: Diagnosis not present

## 2016-07-19 DIAGNOSIS — M1990 Unspecified osteoarthritis, unspecified site: Secondary | ICD-10-CM | POA: Insufficient documentation

## 2016-07-19 DIAGNOSIS — F418 Other specified anxiety disorders: Secondary | ICD-10-CM | POA: Insufficient documentation

## 2016-07-19 HISTORY — DX: Malignant neoplasm of brain, unspecified: C71.9

## 2016-07-19 MED ORDER — LORAZEPAM 0.5 MG PO TABS
0.5000 mg | ORAL_TABLET | Freq: Once | ORAL | Status: AC
Start: 2016-07-19 — End: 2016-07-19
  Administered 2016-07-19: 0.5 mg via ORAL
  Filled 2016-07-19: qty 1

## 2016-07-19 NOTE — Telephone Encounter (Signed)
sw pt to sch genetics appt. Pt did not want to sch at this time

## 2016-07-19 NOTE — Progress Notes (Signed)
See progress note under physician encounter. 

## 2016-07-19 NOTE — Progress Notes (Signed)
  Radiation Oncology         [phone] ________________________________  Name: Kaitlyn Keith MRN: [id number]  Date: 07/19/2016  DOB: 1965/12/05  SIMULATION AND TREATMENT PLANNING NOTE    ICD-9-CM ICD-10-CM   1. 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  Identity was confirmed.  All relevant records and images related to the planned course of therapy were reviewed.  The patient freely provided informed written consent to proceed with treatment after reviewing the details related to the planned course of therapy. The consent form was witnessed and verified by the simulation staff. Intravenous access was established for contrast administration. Then, the patient was set-up in a stable reproducible supine position for radiation therapy.  A relocatable thermoplastic stereotactic head frame was fabricated for precise immobilization.  CT images were obtained.  Surface markings were placed.  The CT images were loaded into the planning software and fused with the patient's targeting MRI scan.  Then the target and avoidance structures were contoured.  Treatment planning then occurred.  The radiation prescription was entered and confirmed.  I have requested 3D planning  I have requested a DVH of the following structures: Brain stem, brain, left eye, right eye, lenses, optic chiasm, target volumes, uninvolved brain, and normal tissue.    SPECIAL TREATMENT PROCEDURE:  The planned course of therapy using radiation constitutes a special treatment procedure. Special care is required in the management of this patient for the following reasons. This treatment constitutes a Special Treatment Procedure for the following reason: High dose per fraction requiring special  monitoring for increased toxicities of treatment including daily imaging.  The special nature of the planned course of radiotherapy will require increased physician supervision and oversight to ensure patient's safety with optimal treatment outcomes.  PLAN:  The patient will receive 18 Gy in 1 fraction with pre-op intent to be followed by resection with Dr. Cyndy Freeze.  ________________________________  Sheral Apley. Tammi Klippel, M.D.

## 2016-07-19 NOTE — Progress Notes (Signed)
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 Secondary malignant neoplasm of brain and spinal cord (HCC) 198.3 C79.31     C79.49     HISTORY OF PRESENT ILLNESS: [name] is a 51 y.o. female seen at the request of Dr. Cyndy Freeze for a history of breast cancer with a newly noted metastatic deposit in the right cerebellum. She was treated by Dr. Sondra Come from 07/23/15-09/05/15 for Stage IIA (T2, N0, M0) right breast cancer. She is status post chemotherapy, lumpectomy, and radiation.   She was admitted to the ED on 07/12/16 with worsening headache and blurry vision. MRI of the brain revealed a 21 x 24 x 17 mm right cerebellar mass with moderated vasogenic edema. The patient was admitted to [facility] internal medicine and received a CT scan of the chest abdomen, and pelvis with contrast on 07/12/16. This scan showed no acute abnormality in the chest, abdomen, or pelvis. CT of the cervical spine on 07/12/16 showed solid fusion of grade 1 anterolisthesis of C2-3 with anterior displacement of the C1 ring. Patient was discharged on 4 mg Decadron every 8 hours. MRI of the brain on 07/17/16 showed right cerebellar metastatic deposit measuring 22 x 20 mm with no other metastatic deposits.      A 7 mm anterior slip C2-3 was noted. Patient is referred today out of consideration for pre-op stereotactic radiosurgery followed by craniotomy and tumor resection on 07/27/16.  PREVIOUS RADIATION THERAPY: Yes   07/23/15-09/05/15 50.4 Gy to the right breast + 10 Gy boost  PAST MEDICAL HISTORY:    Past Medical History:  Diagnosis Date  . Anxiety   . Arthritis   . Back pain   . Brain cancer ([place])   . Breast cancer ([place])   . Breast cancer of lower-outer quadrant of right female breast ([place]) 12/05/2014  . Depression   . Hot flashes   . Hypertension   . 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 Radiation 07/23/15-09/05/15   right breast 50.4 Gy, boost of 10 Gy      PAST SURGICAL HISTORY: Past Surgical History:  Procedure Laterality Date  . BREAST LUMPECTOMY WITH NEEDLE LOCALIZATION AND AXILLARY SENTINEL LYMPH NODE BX Right 06/16/2015   Procedure: BREAST LUMPECTOMY WITH NEEDLE LOCALIZATION AND AXILLARY SENTINEL LYMPH NODE BX;  Surgeon: [name], MD;  Location: [place];  Service: General;  Laterality: Right;  . BREAST REDUCTION SURGERY    . PORT-A-CATH REMOVAL N/A 06/16/2015   Procedure: REMOVAL PORT-A-CATH;  Surgeon: [name], MD;  Location: [place];  Service: General;  Laterality: N/A;  . PORTACATH PLACEMENT N/A 12/23/2014   Procedure: INSERTION PORT-A-CATH;  Surgeon: [name], MD;  Location: [place];  Service: General;  Laterality: N/A;    FAMILY HISTORY:  Family History  Problem Relation Age of Onset  . Cancer Neg Hx     SOCIAL HISTORY:  Social History   Social History  . Marital status: Married    Spouse name: N/A  . Number  of children: 1  . Years of education: N/A   Occupational History  .     Social History Main Topics  . Smoking status: Never Smoker  . Smokeless tobacco: Never Used  . Alcohol use Yes     Comment: social  . Drug use: No  . Sexual activity: Yes   Other Topics Concern  . Not on file   Social History Narrative  . No narrative on file    ALLERGIES: Patient has no known allergies.  MEDICATIONS:  Current Outpatient Prescriptions  Medication Sig Dispense Refill  . ALPRAZolam (XANAX) 1 MG tablet Take 2 tablets (2 mg total) by mouth at bedtime.  (Patient taking differently: Take 1 mg by mouth 2 (two) times daily as needed for anxiety or sleep. ) 60 tablet 0  . dexamethasone (DECADRON) 4 MG tablet Take 1 tablet (4 mg total) by mouth 3 (three) times daily. 90 tablet 0  . lisinopril-hydrochlorothiazide (PRINZIDE,ZESTORETIC) 20-12.5 MG tablet Take 1 tablet by mouth 2 (two) times daily. 60 tablet 6  . Vitamin D, Ergocalciferol, (DRISDOL) 50000 UNITS CAPS capsule Take 1 capsule (50,000 Units total) by mouth every 7 (seven) days. (Patient taking differently: Take 50,000 Units by mouth every 7 (seven) days. mondays) 12 capsule 3  . DULoxetine (CYMBALTA) 20 MG capsule Take 2 capsules (40 mg total) by mouth daily. (Patient not taking: Reported on 07/19/2016) 30 capsule 3  . gabapentin (NEURONTIN) 400 MG capsule Take 400 mg (1tab) PO TID (Patient not taking: Reported on 07/19/2016) 100 capsule 1  . LORazepam (ATIVAN) 1 MG tablet TAKE ONE TABLET BY MOUTH EVERY 6 HOURS AS NEEDED FOR NAUSEA AND VOMITING (Patient not taking: Reported on 07/16/2016) 30 tablet 0  . oxyCODONE-acetaminophen (PERCOCET/ROXICET) 5-325 MG tablet Take 1-2 tablets by mouth every 6 (six) hours as needed for moderate pain or severe pain (1 for moderate pain and 2 for severe pain). (Patient not taking: Reported on 07/19/2016) 30 tablet 0   No current facility-administered medications for this encounter.     REVIEW OF SYSTEMS:  On review of systems, the patient reports that she is doing well overall. She denies any chest pain, shortness of breath, cough, fevers, chills, night sweats, unintended weight changes. She denies any bowel or bladder disturbances, and denies abdominal pain, nausea or vomiting. She denies any new musculoskeletal or joint aches or pains. She denies seizures, but believes she has had tremors in the past. She notes headaches and dizziness have resolved following Decadron treatment. She reports blurred vision and slurred speech has improved with Decadron. She notes one  episode or nausea and vomiting prior to hospitalization. She reports episodes of difficulty with hand coordination several months ago, and her handwriting at time is illegible. Patient notes scrambled thoughts. She reports decadron is causing nervous energy and insomnia. She reports that she has not slept since 0900 Sunday morning. A complete review of systems is obtained and is otherwise negative.   PHYSICAL EXAM:  Wt Readings from Last 3 Encounters:  07/19/16 183 lb 9.6 oz (83.3 kg)  07/12/16 183 lb (83 kg)  11/04/15 185 lb 8 oz (84.1 kg)   Temp Readings from Last 3 Encounters:  07/19/16 98.2 F (36.8 C) (Oral)  07/13/16 98.2 F (36.8 C) (Oral)  11/04/15 98.7 F (37.1 C) (Oral)   BP Readings from Last 3 Encounters:  07/19/16 (!) 145/107  07/13/16 (!) 142/95  11/04/15 (!) 163/93   Pulse Readings from Last 3 Encounters:  07/19/16 63  07/13/16 66  11/04/15 70   Pain Assessment Pain Score: 0-No pain/10  In general this is a well appearing african american woman in no acute distress. She is alert and oriented x4 and appropriate throughout the examination. HEENT reveals that the patient is normocephalic, atraumatic. EOMs are intact. PERRLA. Skin is intact without any evidence of gross lesions. Cardiovascular exam reveals a regular rate and rhythm, no clicks rubs or murmurs are auscultated. Chest is clear to auscultation bilaterally. Lymphatic assessment is performed and does not reveal any adenopathy in the cervical, supraclavicular, axillary, or inguinal chains. Abdomen has active bowel sounds in all quadrants and is intact. The abdomen is soft, non tender, non distended. Lower extremities are negative for pretibial pitting edema, deep calf tenderness, cyanosis or clubbing. She appears to be neurologically intact. She does not have any gait disturbances.   KPS = 80  100 - Normal; no complaints; no evidence of disease. 90   - Able to carry on normal activity; minor signs or symptoms of  disease. 80   - Normal activity with effort; some signs or symptoms of disease. 50   - Cares for self; unable to carry on normal activity or to do active work. 60   - Requires occasional assistance, but is able to care for most of his personal needs. 50   - Requires considerable assistance and frequent medical care. 25   - Disabled; requires special care and assistance. 49   - Severely disabled; hospital admission is indicated although death not imminent. 21   - Very sick; hospital admission necessary; active supportive treatment necessary. 10   - Moribund; fatal processes progressing rapidly. 0     - Dead  Karnofsky DA, Abelmann Idamay, Craver LS and Burchenal [facility] [phone]) The use of the nitrogen mustards in the palliative treatment of carcinoma: with particular reference to bronchogenic carcinoma Cancer 1 634-56  LABORATORY DATA:  Lab Results  Component Value Date   WBC 5.4 07/12/2016   HGB 15.3 (H) 07/12/2016   HCT 44.8 07/12/2016   MCV 87.0 07/12/2016   PLT 173 07/12/2016   Lab Results  Component Value Date   NA 137 07/12/2016   K 3.6 07/12/2016   CL 103 07/12/2016   CO2 24 07/12/2016   Lab Results  Component Value Date   ALT 13 06/23/2015   AST 13 06/23/2015   ALKPHOS 91 06/23/2015   BILITOT 0.41 06/23/2015     RADIOGRAPHY: Ct Head Wo Contrast  Result Date: 07/12/2016 CLINICAL DATA:  51 year old hypertensive female with history of breast cancer (post radiation therapy) presenting with 2 weeks of headaches worse over the past 2 days. Initial encounter. EXAM: CT HEAD WITHOUT CONTRAST TECHNIQUE: Contiguous axial images were obtained from the base of the skull through the vertex without intravenous contrast. COMPARISON:  None. FINDINGS: Brain: Hypodensity within the right cerebellum with local mass effect, compressing the fourth ventricle and displacing the pons anteriorly with minimal downward displacement of the cerebellar tonsils. Although a subacute infarct is a possibility, the  additional finding of 1.5 cm rounded hypodensity within the peripheral aspect right cerebellum suggests that findings may be related to metastatic disease. Contrast-enhanced MR recommended for further delineation. The right temporal horn is slightly larger than the left. This may be normal for this patient or represent result of early hydrocephalus. Patient is at risk for development of hydrocephalus given the mass effect upon the fourth ventricle. No intracranial hemorrhage. Vascular: No hyperdense vessel. Skull: No worrisome destructive lesion. Tiny lucencies may represent arachnoid  granulations. Sinuses/Orbits: Tiny radiopaque structure right optic nerve has an appearance more suggestive of calcification/minimal deposition than metallic foreign body. This was discussed with Dr. Roxanne Mins. Minimal polypoid opacification left maxillary sinus. Minimal mucosal thickening ethmoid sinus air cells. Other: Negative IMPRESSION: Abnormality right cerebellum raises possibly of metastatic disease with surrounding vasogenic edema and local mass effect with compression fourth ventricle placing the patient at risk for development of hydrocephalus. Subacute infarct is a secondary less likely consideration. Contrast-enhanced MR recommended for further delineation. Please see above for further detail. These results were called by telephone at the time of interpretation on 07/12/2016 at 7:17 am to Dr. Delora Fuel , who verbally acknowledged these results. Electronically Signed   By: Genia Del M.D.   On: 07/12/2016 07:28   Ct Chest W Contrast  Result Date: 07/12/2016 CLINICAL DATA:  Assess for primary malignancy or metastatic disease. Recently diagnosed cerebellar mass. Initial encounter. EXAM: CT CHEST, ABDOMEN, AND PELVIS WITH CONTRAST TECHNIQUE: Multidetector CT imaging of the chest, abdomen and pelvis was performed following the standard protocol during bolus administration of intravenous contrast. CONTRAST:  167mL ISOVUE-300  IOPAMIDOL (ISOVUE-300) INJECTION 61% COMPARISON:  Chest radiograph performed 12/23/2014, and CT of the abdomen and pelvis performed 03/13/2005 FINDINGS: CT CHEST FINDINGS Cardiovascular: The heart is normal in size. The thoracic aorta is unremarkable. The great vessels are within normal limits. Mediastinum/Nodes: The mediastinum is unremarkable. No mediastinal lymphadenopathy is seen. No pericardial effusion is identified. The visualized portions of thyroid gland are unremarkable. No axillary lymphadenopathy is appreciated. Postoperative change is noted at the right axilla. Lungs/Pleura: Minimal bilateral dependent subsegmental atelectasis is noted. The lungs are otherwise clear. No pleural effusion or pneumothorax is seen. No masses are identified. Musculoskeletal: No acute osseous abnormalities are identified. The visualized musculature is unremarkable in appearance. CT ABDOMEN PELVIS FINDINGS Hepatobiliary: Scattered hypodensities within the liver measure up to 1.0 cm in size. The liver is otherwise unremarkable. The gallbladder is grossly unremarkable. The common bile duct remains normal in caliber. Pancreas: The pancreas is within normal limits. Spleen: The spleen is unremarkable in appearance. Adrenals/Urinary Tract: The adrenal glands are unremarkable in appearance. The kidneys are within normal limits. There is no evidence of hydronephrosis. No renal or ureteral stones are identified. No perinephric stranding is seen. Stomach/Bowel: The stomach is unremarkable in appearance. The small bowel is within normal limits. The appendix is normal in caliber, without evidence of appendicitis. The colon is unremarkable in appearance. Vascular/Lymphatic: The abdominal aorta is unremarkable in appearance. The inferior vena cava is grossly unremarkable. No retroperitoneal lymphadenopathy is seen. No pelvic sidewall lymphadenopathy is identified. Reproductive: The bladder is mildly distended and within normal limits. The  uterus is grossly unremarkable in appearance. The ovaries are relatively symmetric. No suspicious adnexal masses are seen. Other: No additional soft tissue abnormalities are seen. Musculoskeletal: No acute osseous abnormalities are identified. Mild facet disease is noted at the lower lumbar spine. The visualized musculature is unremarkable in appearance. IMPRESSION: 1. No acute abnormality seen within the chest, abdomen or pelvis. 2. Minimal bilateral dependent subsegmental atelectasis noted. Lungs otherwise clear. 3. Scattered nonspecific hypodensities within the liver measure up to 1.0 cm in size. The larger of these is stable from 2006. Electronically Signed   By: Garald Balding M.D.   On: 07/12/2016 21:24   Ct Cervical Spine Wo Contrast  Result Date: 07/12/2016 CLINICAL DATA:  Cerebellar mass. Abnormal appearance of MRI at C2-3. EXAM: CT CERVICAL SPINE WITHOUT CONTRAST TECHNIQUE: Multidetector CT imaging of the cervical  spine was performed without intravenous contrast. Multiplanar CT image reconstructions were also generated. COMPARISON:  MRI brain from the same day. FINDINGS: Alignment: 7 mm grade 2 anterolisthesis is present at C2-3. There is solid osseous fusion across the anterolisthesis. The foramina are widened bilaterally. C1 is displaced anteriorly as result of the fracture. Skull base and vertebrae: The right cerebellar mass lesion is again noted. No significant calcifications are associated. Soft tissues and spinal canal: The soft tissues the neck are otherwise unremarkable. No significant adenopathy is present. The thyroid and salivary glands are within normal limits. Disc levels: Uncovertebral spurring contributes to bilateral foraminal narrowing bilaterally at C4-5 and C5-6, right greater than left. Mild bilateral foraminal narrowing is present at C6-7. Upper chest: The lung apices are clear. IMPRESSION: 1. Solid fusion of grade 1 anterolisthesis at C2-3 with anterior displacement of the C1  ring. 2. No acute fracture. 3. Degenerate changes at C4-5, C5-6, and C6-7 with foraminal narrowing at these levels. Electronically Signed   By: San Morelle M.D.   On: 07/12/2016 21:24   Mr Jeri Cos [id number] Contrast  Result Date: 07/18/2016 CLINICAL DATA:  Secondary malignant neoplasm of brain. Breast cancer with metastatic disease to the brain. Preop SRS radiation treatment planning. Preop surgical planning. EXAM: MRI HEAD WITHOUT AND WITH CONTRAST TECHNIQUE: Multiplanar, multiecho pulse sequences of the brain and surrounding structures were obtained without and with intravenous contrast. CONTRAST:  70mL MULTIHANCE GADOBENATE DIMEGLUMINE 529 MG/ML IV SOLN COMPARISON:  MRI head 07/12/2016 FINDINGS: Brain: SRS protocol at 3 Tesla. Ring-enhancing mass lesion right lateral cerebellum measuring 22 x 20 mm. This is unchanged in size from the recent MRI. There is a moderate amount of edema in the right cerebellum with mass-effect on the fourth ventricle. No obstructive hydrocephalus. No other enhancing metastatic deposits in the brain. Small white matter hyperintensities in the right frontal lobe and left frontal lobe most likely due to chronic ischemia. No acute infarct. Negative for hemorrhage. Ventricle size normal. Vascular: Normal arterial flow voids.  Normal venous enhancement. Skull and upper cervical spine: 7 mm anterior slip C2-3. This has been evaluated with CT and appears chronic. No cord deformity. No skeletal mass lesion. Sinuses/Orbits: Negative Other: None IMPRESSION: Right cerebellar metastatic deposit measures 22 x 20 mm. Moderate edema with mass-effect on the fourth ventricle. No hydrocephalus. No other metastatic deposits 7 mm anterior slip C2-3. This appears chronic on recent CT evaluation. Electronically Signed   By: Franchot Gallo M.D.   On: 07/18/2016 14:47   Mr Jeri Cos And Wo Contrast  Result Date: 07/12/2016 CLINICAL DATA:  Brain mass. History of breast cancer treated in 2016. EXAM: MRI  HEAD WITHOUT AND WITH CONTRAST TECHNIQUE: Multiplanar, multiecho pulse sequences of the brain and surrounding structures were obtained without and with intravenous contrast. CONTRAST:  93mL MULTIHANCE GADOBENATE DIMEGLUMINE 529 MG/ML IV SOLN COMPARISON:  Head CT from earlier today FINDINGS: Brain: Rim enhancing mass in the peripheral right cerebellum measuring 21 x 24 x 17 mm. No internal restricted diffusion. Moderate vasogenic edema with fourth ventricular effacement. No lateral ventriculomegaly currently. No second mass is seen. No acute infarct or hemorrhage. Mild white matter hyperintensity in the cerebral hemispheres, usually nonspecific microvascular insults. Vascular: Preserved flow voids Skull and upper cervical spine: No marrow lesion is noted. There is a inferior posterior corner fracture of C2 with probable pars defect on the left, hangman's type pattern. Anterolisthesis and disc narrowing at C2-3. Changes are chronic based on history and lack of edema. Sinuses/Orbits: No  evidence of mass. IMPRESSION: 1. 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 Moderate vasogenic edema. Prominent narrowing of the fourth ventricle without lateral ventriculomegaly. 2. Findings of remote, displaced C2 fracture. If this is a previously unknown finding, noncontrast cervical spine CT is recommended. Electronically Signed   By: Monte Fantasia M.D.   On: 07/12/2016 09:58   Ct Abdomen Pelvis W Contrast  Result Date: 07/12/2016 CLINICAL DATA:  Assess for primary malignancy or metastatic disease. Recently diagnosed cerebellar mass. Initial encounter. EXAM: CT CHEST, ABDOMEN, AND PELVIS WITH CONTRAST TECHNIQUE: Multidetector CT imaging of the chest, abdomen and pelvis was performed following the standard protocol during bolus administration of intravenous contrast. CONTRAST:  151mL ISOVUE-300 IOPAMIDOL (ISOVUE-300) INJECTION 61% COMPARISON:  Chest radiograph performed 12/23/2014, and CT  of the abdomen and pelvis performed 03/13/2005 FINDINGS: CT CHEST FINDINGS Cardiovascular: The heart is normal in size. The thoracic aorta is unremarkable. The great vessels are within normal limits. Mediastinum/Nodes: The mediastinum is unremarkable. No mediastinal lymphadenopathy is seen. No pericardial effusion is identified. The visualized portions of thyroid gland are unremarkable. No axillary lymphadenopathy is appreciated. Postoperative change is noted at the right axilla. Lungs/Pleura: Minimal bilateral dependent subsegmental atelectasis is noted. The lungs are otherwise clear. No pleural effusion or pneumothorax is seen. No masses are identified. Musculoskeletal: No acute osseous abnormalities are identified. The visualized musculature is unremarkable in appearance. CT ABDOMEN PELVIS FINDINGS Hepatobiliary: Scattered hypodensities within the liver measure up to 1.0 cm in size. The liver is otherwise unremarkable. The gallbladder is grossly unremarkable. The common bile duct remains normal in caliber. Pancreas: The pancreas is within normal limits. Spleen: The spleen is unremarkable in appearance. Adrenals/Urinary Tract: The adrenal glands are unremarkable in appearance. The kidneys are within normal limits. There is no evidence of hydronephrosis. No renal or ureteral stones are identified. No perinephric stranding is seen. Stomach/Bowel: The stomach is unremarkable in appearance. The small bowel is within normal limits. The appendix is normal in caliber, without evidence of appendicitis. The colon is unremarkable in appearance. Vascular/Lymphatic: The abdominal aorta is unremarkable in appearance. The inferior vena cava is grossly unremarkable. No retroperitoneal lymphadenopathy is seen. No pelvic sidewall lymphadenopathy is identified. Reproductive: The bladder is mildly distended and within normal limits. The uterus is grossly unremarkable in appearance. The ovaries are relatively symmetric. No suspicious  adnexal masses are seen. Other: No additional soft tissue abnormalities are seen. Musculoskeletal: No acute osseous abnormalities are identified. Mild facet disease is noted at the lower lumbar spine. The visualized musculature is unremarkable in appearance. IMPRESSION: 1. No acute abnormality seen within the chest, abdomen or pelvis. 2. Minimal bilateral dependent subsegmental atelectasis noted. Lungs otherwise clear. 3. Scattered nonspecific hypodensities within the liver measure up to 1.0 cm in size. The larger of these is stable from 2006. Electronically Signed   By: Garald Balding M.D.   On: 07/12/2016 21:24      IMPRESSION/PLAN: 1. 51 y.o.woman with recurrent Stage IIA (T2, N0, M0) of the right breast to the brain with a solitary cerebellar metastasis  measuring 22 x 20 mm. We reviewed her pathology from her original tumor, and findings from her recent imaging studies. We focused our time today discussing the findings from her MRI studies including the 3T MRI from 07/17/16.  We discussed the risks, benefits, short, and long term effects of radiotherapy, and the patient is interested in proceeding. We discussed the delivery and logistics of radiotherapy. She is scheduled today for simulation today  and for radiotherapy on 07/26/16, with surgery under the care of Dr. Cyndy Freeze on 07/27/16. She will be given Ativan 0.5 mg one tab po today in lieu of her planning session.  The above documentation reflects our direct findings during this shared patient visit.      Carola Rhine, PAC  &    Tyler Pita, MD [place] Director and Director of Stereotactic Radiosurgery Direct Dial: [phone]  Fax: [phone] [email or web address]  Skype  [email or web address]      This document serves as a record of services personally performed by Shona Simpson, PA-C and Tyler Pita, MD. It was created on their behalf by Bethann Humble, a trained medical scribe. The creation of this record  is based on the scribe's personal observations and the provider's statements to them. This document has been checked and approved by the attending provider.

## 2016-07-19 NOTE — Progress Notes (Signed)
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 mm right cerebellar mass   Patient presented with symptoms of:  Presented to [facility] emergency department on 07/12/16 with worsening headache and blurry vision  Past or anticipated interventions, if any, per neurosurgery: patient will have stereotactic radiosurgery followed by craniotomy and tumor resection  Past or anticipated interventions, if any, per medical oncology: s/p chemotherapy for breast ca  Dose of Decadron, if applicable: discharged on decadron 4 mg every eight hours  Recent neurologic symptoms, if any:   Seizures: None. Reports she believes she has had tremors in the past.   Headaches: reports headaches stopped after she began taking decadron.   Nausea: Reports one episode of nausea and vomiting prior to hospitalization.   Dizziness/ataxia: dizziness prior to hospitalization but, none now that she is taking decadron  Difficulty with hand coordination: reports episodes of this several months ago but, none since hospitalization. Reports at times her handwritting is illegible.   Focal numbness/weakness: no  Visual deficits/changes: reports blurred vision is less with decadron  Confusion/Memory deficits: reports scrambled thoughts  Reports slurred speech is less with decadron  Painful bone metastases at present, if any: No  SAFETY ISSUES:  Prior radiation? yes  Pacemaker/ICD? no  Possible current pregnancy? no  Is the patient on methotrexate? no  Additional Complaints / other details: 51 year old female. Engaged. Reports she hasn't slept since 0900 Sunday morning. Reports decadron is causing nervous energy and insomnia. Patient has gone out of work for 12 weeks. She works for [employer].  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

## 2016-07-20 DIAGNOSIS — C7931 Secondary malignant neoplasm of brain: Secondary | ICD-10-CM | POA: Diagnosis not present

## 2016-07-21 ENCOUNTER — Ambulatory Visit: Payer: BLUE CROSS/BLUE SHIELD

## 2016-07-21 ENCOUNTER — Ambulatory Visit: Payer: BLUE CROSS/BLUE SHIELD | Admitting: Radiation Oncology

## 2016-07-21 DIAGNOSIS — C7931 Secondary malignant neoplasm of brain: Secondary | ICD-10-CM | POA: Diagnosis not present

## 2016-07-22 NOTE — Pre-Procedure Instructions (Signed)
    Kaitlyn Keith  07/22/2016      [place] ([place]), [place] [id number] [place] [address] Phone: [phone] Fax: [phone]  [place], [place] - [address] [place] [address] Phone: [phone] Fax: [phone]    Your procedure is scheduled on Tues. Mar. 6  Report to [facility] Admitting at 5:30 A.M.  Call this number if you have problems the morning of surgery:  [phone]   Remember:  Do not eat food or drink liquids after midnight on Mon. Mar. 5   Take these medicines the morning of surgery with A SIP OF WATER: alprazolam (xanax), dexamethasone (decadron),              Stop advil, motrin, ibuprofen, aleve, Bc Powders, Goody's, vitamins/herbal medicines.   Do not wear jewelry, make-up or nail polish.  Do not wear lotions, powders, or perfumes, or deoderant.  Do not shave 48 hours prior to surgery.  Men may shave face and neck.  Do not bring valuables to the hospital.  [facility] is not responsible for any belongings or valuables.  Contacts, dentures or bridgework may not be worn into surgery.  Leave your suitcase in the car.  After surgery it may be brought to your room.  For patients admitted to the hospital, discharge time will be determined by your treatment team.  Patients discharged the day of surgery will not be allowed to drive home.    Special instructions:  Review preparing for surgery  Please read over the following fact sheets that you were given. Coughing and Deep Breathing

## 2016-07-23 ENCOUNTER — Encounter (HOSPITAL_COMMUNITY)
Admission: RE | Admit: 2016-07-23 | Discharge: 2016-07-23 | Disposition: A | Discharge status: Home / Self Care | Payer: BLUE CROSS/BLUE SHIELD | Source: Ambulatory Visit | Attending: Neurological Surgery | Admitting: Neurological Surgery

## 2016-07-23 ENCOUNTER — Encounter (HOSPITAL_COMMUNITY): Payer: Self-pay

## 2016-07-23 DIAGNOSIS — D496 Neoplasm of unspecified behavior of brain: Secondary | ICD-10-CM | POA: Insufficient documentation

## 2016-07-23 DIAGNOSIS — Z01812 Encounter for preprocedural laboratory examination: Secondary | ICD-10-CM | POA: Insufficient documentation

## 2016-07-23 HISTORY — DX: Family history of other specified conditions: Z84.89

## 2016-07-23 HISTORY — DX: Family history of other disabilities and chronic diseases leading to disablement, not elsewhere classified: Z82.8

## 2016-07-23 LAB — TYPE AND SCREEN
ABO/RH(D): O POS
ANTIBODY SCREEN: NEGATIVE

## 2016-07-23 LAB — BASIC METABOLIC PANEL
Anion gap: 9 (ref 5–15)
BUN: 14 mg/dL (ref 6–20)
CALCIUM: 9.6 mg/dL (ref 8.9–10.3)
CHLORIDE: 103 mmol/L (ref 101–111)
CO2: 26 mmol/L (ref 22–32)
CREATININE: 0.73 mg/dL (ref 0.44–1.00)
GFR calc Af Amer: >60 mL/min (ref >60)
GFR calc non Af Amer: >60 mL/min (ref >60)
Glucose, Bld: 103 mg/dL — ABNORMAL HIGH (ref 65–99)
Potassium: 3.5 mmol/L (ref 3.5–5.1)
Sodium: 138 mmol/L (ref 135–145)

## 2016-07-23 LAB — APTT: aPTT: 24 seconds (ref 24–36)

## 2016-07-23 LAB — CBC
HCT: 45.4 % (ref 36.0–46.0)
Hemoglobin: 15.2 g/dL — ABNORMAL HIGH (ref 12.0–15.0)
MCH: 29.9 pg (ref 26.0–34.0)
MCHC: 33.5 g/dL (ref 30.0–36.0)
MCV: 89.4 fL (ref 78.0–100.0)
PLATELETS: 236 10*3/uL (ref 150–400)
RBC: 5.08 MIL/uL (ref 3.87–5.11)
RDW: 12.8 % (ref 11.5–15.5)
WBC: 10.6 10*3/uL — ABNORMAL HIGH (ref 4.0–10.5)

## 2016-07-23 LAB — ABO/RH: ABO/RH(D): O POS

## 2016-07-23 LAB — PROTIME-INR
INR: 1
Prothrombin Time: 13.2 seconds (ref 11.4–15.2)

## 2016-07-23 LAB — HCG, SERUM, QUALITATIVE: PREG SERUM: NEGATIVE

## 2016-07-23 NOTE — Progress Notes (Signed)
Late entry from 07/19/16 at 1330  Has armband been applied?  Yes.    Does patient have an allergy to IV contrast dye?: No.   Has patient ever received premedication for IV contrast dye?: No.   Does patient take metformin?: No.  If patient does take metformin when was the last dose: N/A  Date of lab work: 06/22/16 BUN: 14.9 CR: 0.8  IV site: forearm left, condition no redness  Has IV site been added to flowsheet?  Yes.

## 2016-07-23 NOTE — Progress Notes (Signed)
PCP/Cardiologist: Valora Piccolo @ [facility] on [address]. [place],

## 2016-07-26 ENCOUNTER — Ambulatory Visit
Admission: RE | Admit: 2016-07-26 | Discharge: 2016-07-26 | Disposition: A | Discharge status: Home / Self Care | Payer: BLUE CROSS/BLUE SHIELD | Source: Ambulatory Visit | Attending: Radiation Oncology | Admitting: Radiation Oncology

## 2016-07-26 DIAGNOSIS — C7931 Secondary malignant neoplasm of brain: Secondary | ICD-10-CM | POA: Diagnosis not present

## 2016-07-26 NOTE — Progress Notes (Signed)
Nurse monitoring complete following SRS treatment. Vitals stable. Patient denies pain. Instructions for surgery tomorrow reviewed with the patient by Mont Dutton, RT. Patient denies headache, dizziness, nausea, diplopia or ringing in the ears. Patient reports difficulty sleeping related to effects of steroids. Patient understands that Dr. Cyndy Freeze will taper steroids following surgery. Patient understands to avoid strenuous activity and phone [phone] with any needs tonight. Patient left clinic ambulatory and in no distress.

## 2016-07-26 NOTE — Op Note (Signed)
  Name: Kaitlyn Keith  MRN: [id number]  Date: 07/26/2016   DOB: 27-Jul-1965  Stereotactic Radiosurgery Operative Note  PRE-OPERATIVE DIAGNOSIS:  Solitary Brain Metastasis  POST-OPERATIVE DIAGNOSIS:  Solitary Brain Metastasis  PROCEDURE:  Stereotactic Radiosurgery  SURGEON:  Kevan Ny Ditty, MD  NARRATIVE: The patient underwent a radiation treatment planning session in the radiation oncology simulation suite under the care of the radiation oncology physician and physicist.  I participated closely in the radiation treatment planning afterwards. The patient underwent planning CT which was fused to 3T high resolution MRI with 1 mm axial slices.  These images were fused on the planning system.  We contoured the gross target volumes and subsequently expanded this to yield the Planning Target Volume. I actively participated in the planning process.  I helped to define and review the target contours and also the contours of the optic pathway, eyes, brainstem and selected nearby organs at risk.  All the dose constraints for critical structures were reviewed and compared to AAPM Task Group 101.  The prescription dose conformity was reviewed.  I approved the plan electronically.    Accordingly, [name] was brought to the TrueBeam stereotactic radiation treatment linac and placed in the custom immobilization mask.  The patient was aligned according to the IR fiducial markers with BrainLab Exactrac, then orthogonal x-rays were used in ExacTrac with the 6DOF robotic table and the shifts were made to align the patient  [name] received stereotactic radiosurgery uneventfully.    The detailed description of the procedure is recorded in the radiation oncology procedure note.  I was present for the duration of the procedure.  DISPOSITION:  Following delivery, the patient was transported to nursing in stable condition and monitored for possible acute effects to be  discharged to home in stable condition and will undergo surgery tomorrow at [place] Ditty, MD 07/26/2016 2:33 PM

## 2016-07-27 ENCOUNTER — Inpatient Hospital Stay (HOSPITAL_COMMUNITY)
Admission: RE | Admit: 2016-07-27 | Discharge: 2016-07-29 | DRG: 026 | Disposition: A | Discharge status: Home / Self Care | Payer: BLUE CROSS/BLUE SHIELD | Source: Ambulatory Visit | Attending: Neurological Surgery | Admitting: Neurological Surgery

## 2016-07-27 ENCOUNTER — Encounter (HOSPITAL_COMMUNITY): Payer: Self-pay | Admitting: Urology

## 2016-07-27 ENCOUNTER — Inpatient Hospital Stay (HOSPITAL_COMMUNITY): Payer: BLUE CROSS/BLUE SHIELD

## 2016-07-27 ENCOUNTER — Encounter (HOSPITAL_COMMUNITY)
Admission: RE | Disposition: A | Discharge status: Home / Self Care | Payer: Self-pay | Source: Ambulatory Visit | Attending: Neurological Surgery

## 2016-07-27 ENCOUNTER — Inpatient Hospital Stay (HOSPITAL_COMMUNITY): Payer: BLUE CROSS/BLUE SHIELD | Admitting: *Deleted

## 2016-07-27 DIAGNOSIS — F419 Anxiety disorder, unspecified: Secondary | ICD-10-CM | POA: Diagnosis present

## 2016-07-27 DIAGNOSIS — C7931 Secondary malignant neoplasm of brain: Secondary | ICD-10-CM | POA: Diagnosis present

## 2016-07-27 DIAGNOSIS — I739 Peripheral vascular disease, unspecified: Secondary | ICD-10-CM | POA: Diagnosis present

## 2016-07-27 DIAGNOSIS — S12100A Unspecified displaced fracture of second cervical vertebra, initial encounter for closed fracture: Secondary | ICD-10-CM | POA: Diagnosis present

## 2016-07-27 DIAGNOSIS — Z79899 Other long term (current) drug therapy: Secondary | ICD-10-CM | POA: Diagnosis not present

## 2016-07-27 DIAGNOSIS — Z9221 Personal history of antineoplastic chemotherapy: Secondary | ICD-10-CM | POA: Diagnosis not present

## 2016-07-27 DIAGNOSIS — Y929 Unspecified place or not applicable: Secondary | ICD-10-CM

## 2016-07-27 DIAGNOSIS — I10 Essential (primary) hypertension: Secondary | ICD-10-CM | POA: Diagnosis present

## 2016-07-27 DIAGNOSIS — Z923 Personal history of irradiation: Secondary | ICD-10-CM

## 2016-07-27 DIAGNOSIS — Z853 Personal history of malignant neoplasm of breast: Secondary | ICD-10-CM | POA: Diagnosis not present

## 2016-07-27 DIAGNOSIS — F329 Major depressive disorder, single episode, unspecified: Secondary | ICD-10-CM | POA: Diagnosis present

## 2016-07-27 DIAGNOSIS — D496 Neoplasm of unspecified behavior of brain: Secondary | ICD-10-CM | POA: Diagnosis present

## 2016-07-27 DIAGNOSIS — G9389 Other specified disorders of brain: Secondary | ICD-10-CM

## 2016-07-27 DIAGNOSIS — Z452 Encounter for adjustment and management of vascular access device: Secondary | ICD-10-CM

## 2016-07-27 HISTORY — PX: CRANIOTOMY: SHX93

## 2016-07-27 HISTORY — PX: APPLICATION OF CRANIAL NAVIGATION: SHX6578

## 2016-07-27 LAB — MRSA PCR SCREENING: MRSA by PCR: NEGATIVE

## 2016-07-27 SURGERY — CRANIOTOMY TUMOR EXCISION
Anesthesia: General | Laterality: Right

## 2016-07-27 MED ORDER — ONDANSETRON HCL 4 MG/2ML IJ SOLN: 4.0000 mg | Freq: Four times a day (QID) | INTRAMUSCULAR | Status: DC | PRN

## 2016-07-27 MED ORDER — DEXAMETHASONE SODIUM PHOSPHATE 4 MG/ML IJ SOLN
4.0000 mg | Freq: Four times a day (QID) | INTRAMUSCULAR | Status: DC
  Administered 2016-07-29 (×3): 4 mg via INTRAVENOUS
  Filled 2016-07-27 (×2): qty 1

## 2016-07-27 MED ORDER — MIDAZOLAM HCL 2 MG/2ML IJ SOLN
2.0000 mg | Freq: Once | INTRAMUSCULAR | Status: AC
  Administered 2016-07-27: 2 mg via INTRAVENOUS

## 2016-07-27 MED ORDER — ALPRAZOLAM 0.5 MG PO TABS
2.0000 mg | ORAL_TABLET | Freq: Every day | ORAL | Status: DC
  Administered 2016-07-27 – 2016-07-28 (×2): 2 mg via ORAL
  Filled 2016-07-27 (×2): qty 4

## 2016-07-27 MED ORDER — ONDANSETRON HCL 4 MG PO TABS: 4.0000 mg | ORAL_TABLET | ORAL | Status: DC | PRN

## 2016-07-27 MED ORDER — CEFAZOLIN SODIUM-DEXTROSE 2-4 GM/100ML-% IV SOLN
2.0000 g | Freq: Three times a day (TID) | INTRAVENOUS | Status: AC
  Administered 2016-07-27 – 2016-07-28 (×2): 2 g via INTRAVENOUS
  Filled 2016-07-27 (×2): qty 100

## 2016-07-27 MED ORDER — THROMBIN 20000 UNITS EX SOLR
CUTANEOUS | Status: AC
  Filled 2016-07-27: qty 20000

## 2016-07-27 MED ORDER — FENTANYL CITRATE (PF) 100 MCG/2ML IJ SOLN
INTRAMUSCULAR | Status: DC | PRN
Start: 2016-07-27 — End: 2016-07-27
  Administered 2016-07-27 (×2): 25 ug via INTRAVENOUS
  Administered 2016-07-27: 50 ug via INTRAVENOUS
  Administered 2016-07-27: 100 ug via INTRAVENOUS

## 2016-07-27 MED ORDER — HYDRALAZINE HCL 20 MG/ML IJ SOLN
5.0000 mg | INTRAMUSCULAR | Status: DC | PRN
  Administered 2016-07-28: 10 mg via INTRAVENOUS
  Filled 2016-07-27: qty 1

## 2016-07-27 MED ORDER — LISINOPRIL 20 MG PO TABS
20.0000 mg | ORAL_TABLET | Freq: Every day | ORAL | Status: DC
  Administered 2016-07-29: 20 mg via ORAL
  Filled 2016-07-27 (×2): qty 1

## 2016-07-27 MED ORDER — HYDROMORPHONE HCL 1 MG/ML IJ SOLN
0.5000 mg | INTRAMUSCULAR | Status: DC | PRN
  Administered 2016-07-27 – 2016-07-28 (×4): 1 mg via INTRAVENOUS
  Filled 2016-07-27 (×3): qty 1

## 2016-07-27 MED ORDER — SODIUM CHLORIDE 0.9 % IV SOLN
INTRAVENOUS | Status: DC | PRN
  Administered 2016-07-27: 15:00:00 via INTRAVENOUS

## 2016-07-27 MED ORDER — THROMBIN 5000 UNITS EX SOLR
OROMUCOSAL | Status: DC | PRN
  Administered 2016-07-27: 5 mL via TOPICAL

## 2016-07-27 MED ORDER — ORAL CARE MOUTH RINSE: 15.0000 mL | Freq: Two times a day (BID) | OROMUCOSAL | Status: DC

## 2016-07-27 MED ORDER — NEOSTIGMINE METHYLSULFATE 5 MG/5ML IV SOSY
PREFILLED_SYRINGE | INTRAVENOUS | Status: AC
  Filled 2016-07-27: qty 5

## 2016-07-27 MED ORDER — NALOXONE HCL 0.4 MG/ML IJ SOLN: 0.0800 mg | INTRAMUSCULAR | Status: DC | PRN

## 2016-07-27 MED ORDER — PHENYLEPHRINE 40 MCG/ML (10ML) SYRINGE FOR IV PUSH (FOR BLOOD PRESSURE SUPPORT)
PREFILLED_SYRINGE | INTRAVENOUS | Status: AC
  Filled 2016-07-27: qty 10

## 2016-07-27 MED ORDER — GABAPENTIN 400 MG PO CAPS: 400.0000 mg | ORAL_CAPSULE | Freq: Three times a day (TID) | ORAL | Status: DC

## 2016-07-27 MED ORDER — BUPIVACAINE-EPINEPHRINE (PF) 0.5% -1:200000 IJ SOLN
INTRAMUSCULAR | Status: DC | PRN
Start: 2016-07-27 — End: 2016-07-27
  Administered 2016-07-27: 10 mL

## 2016-07-27 MED ORDER — SENNA 8.6 MG PO TABS
1.0000 | ORAL_TABLET | Freq: Two times a day (BID) | ORAL | Status: DC
  Administered 2016-07-27 – 2016-07-29 (×4): 8.6 mg via ORAL
  Filled 2016-07-27 (×4): qty 1

## 2016-07-27 MED ORDER — FENTANYL CITRATE (PF) 100 MCG/2ML IJ SOLN
100.0000 ug | Freq: Once | INTRAMUSCULAR | Status: AC
  Administered 2016-07-27: 100 ug via INTRAVENOUS

## 2016-07-27 MED ORDER — CEFAZOLIN SODIUM-DEXTROSE 2-4 GM/100ML-% IV SOLN
2.0000 g | INTRAVENOUS | Status: AC
  Administered 2016-07-27: 2 g via INTRAVENOUS

## 2016-07-27 MED ORDER — HYDROCHLOROTHIAZIDE 12.5 MG PO CAPS
12.5000 mg | ORAL_CAPSULE | Freq: Every day | ORAL | Status: DC
  Administered 2016-07-28: 12.5 mg via ORAL
  Filled 2016-07-27 (×2): qty 1

## 2016-07-27 MED ORDER — PROPOFOL 10 MG/ML IV BOLUS
INTRAVENOUS | Status: DC | PRN
  Administered 2016-07-27: 150 mg via INTRAVENOUS
  Administered 2016-07-27: 50 mg via INTRAVENOUS
  Administered 2016-07-27: 30 mg via INTRAVENOUS

## 2016-07-27 MED ORDER — SODIUM CHLORIDE 0.9 % IV SOLN
INTRAVENOUS | Status: DC | PRN
  Administered 2016-07-27 (×2): via INTRAVENOUS

## 2016-07-27 MED ORDER — HEMOSTATIC AGENTS (NO CHARGE) OPTIME
TOPICAL | Status: DC | PRN
  Administered 2016-07-27: 1 via TOPICAL

## 2016-07-27 MED ORDER — MIDAZOLAM HCL 5 MG/5ML IJ SOLN
INTRAMUSCULAR | Status: DC | PRN
  Administered 2016-07-27: 2 mg via INTRAVENOUS

## 2016-07-27 MED ORDER — PROPOFOL 10 MG/ML IV BOLUS
INTRAVENOUS | Status: AC
  Filled 2016-07-27: qty 20

## 2016-07-27 MED ORDER — PANTOPRAZOLE SODIUM 40 MG IV SOLR
40.0000 mg | Freq: Every day | INTRAVENOUS | Status: DC
  Administered 2016-07-27 – 2016-07-28 (×2): 40 mg via INTRAVENOUS
  Filled 2016-07-27 (×2): qty 40

## 2016-07-27 MED ORDER — LIDOCAINE-EPINEPHRINE 2 %-1:100000 IJ SOLN
INTRAMUSCULAR | Status: AC
  Filled 2016-07-27: qty 1

## 2016-07-27 MED ORDER — SODIUM CHLORIDE 0.9 % IV SOLN: INTRAVENOUS | Status: DC

## 2016-07-27 MED ORDER — DOCUSATE SODIUM 100 MG PO CAPS
100.0000 mg | ORAL_CAPSULE | Freq: Two times a day (BID) | ORAL | Status: DC
  Administered 2016-07-27 – 2016-07-29 (×3): 100 mg via ORAL
  Filled 2016-07-27 (×3): qty 1

## 2016-07-27 MED ORDER — THROMBIN 20000 UNITS EX SOLR
CUTANEOUS | Status: DC | PRN
  Administered 2016-07-27: 20 mL via TOPICAL

## 2016-07-27 MED ORDER — ONDANSETRON HCL 4 MG/2ML IJ SOLN
INTRAMUSCULAR | Status: DC | PRN
  Administered 2016-07-27: 4 mg via INTRAVENOUS

## 2016-07-27 MED ORDER — BACITRACIN ZINC 500 UNIT/GM EX OINT
TOPICAL_OINTMENT | CUTANEOUS | Status: AC
  Filled 2016-07-27: qty 28.35

## 2016-07-27 MED ORDER — THROMBIN 5000 UNITS EX SOLR
CUTANEOUS | Status: AC
  Filled 2016-07-27: qty 5000

## 2016-07-27 MED ORDER — BISACODYL 5 MG PO TBEC: 5.0000 mg | DELAYED_RELEASE_TABLET | Freq: Every day | ORAL | Status: DC | PRN

## 2016-07-27 MED ORDER — HYDROCODONE-ACETAMINOPHEN 5-325 MG PO TABS: 1.0000 | ORAL_TABLET | ORAL | Status: DC | PRN

## 2016-07-27 MED ORDER — LIDOCAINE-EPINEPHRINE 2 %-1:100000 IJ SOLN
INTRAMUSCULAR | Status: DC | PRN
  Administered 2016-07-27: 10 mL

## 2016-07-27 MED ORDER — LISINOPRIL-HYDROCHLOROTHIAZIDE 20-12.5 MG PO TABS: 1.0000 | ORAL_TABLET | Freq: Two times a day (BID) | ORAL | Status: DC

## 2016-07-27 MED ORDER — MIDAZOLAM HCL 2 MG/2ML IJ SOLN
INTRAMUSCULAR | Status: AC
  Administered 2016-07-27: 2 mg via INTRAVENOUS
  Filled 2016-07-27: qty 2

## 2016-07-27 MED ORDER — 0.9 % SODIUM CHLORIDE (POUR BTL) OPTIME
TOPICAL | Status: DC | PRN
  Administered 2016-07-27 (×2): 1000 mL

## 2016-07-27 MED ORDER — FLEET ENEMA 7-19 GM/118ML RE ENEM: 1.0000 | ENEMA | Freq: Once | RECTAL | Status: DC | PRN

## 2016-07-27 MED ORDER — PHENYLEPHRINE HCL 10 MG/ML IJ SOLN
INTRAVENOUS | Status: DC | PRN
  Administered 2016-07-27: 50 ug/min via INTRAVENOUS

## 2016-07-27 MED ORDER — CEFAZOLIN SODIUM-DEXTROSE 2-4 GM/100ML-% IV SOLN
INTRAVENOUS | Status: AC
  Filled 2016-07-27: qty 100

## 2016-07-27 MED ORDER — MIDAZOLAM HCL 2 MG/2ML IJ SOLN
INTRAMUSCULAR | Status: AC
  Filled 2016-07-27: qty 2

## 2016-07-27 MED ORDER — MANNITOL 25 % IV SOLN
50.0000 g | INTRAVENOUS | Status: AC
  Administered 2016-07-27: 50 g via INTRAVENOUS
  Filled 2016-07-27: qty 200

## 2016-07-27 MED ORDER — GLYCOPYRROLATE 0.2 MG/ML IJ SOLN
INTRAMUSCULAR | Status: DC | PRN
  Administered 2016-07-27: 0.2 mg via INTRAVENOUS
  Administered 2016-07-27: 0.4 mg via INTRAVENOUS

## 2016-07-27 MED ORDER — FENTANYL CITRATE (PF) 100 MCG/2ML IJ SOLN
INTRAMUSCULAR | Status: AC
  Administered 2016-07-27: 100 ug via INTRAVENOUS
  Filled 2016-07-27: qty 2

## 2016-07-27 MED ORDER — DEXAMETHASONE SODIUM PHOSPHATE 10 MG/ML IJ SOLN
INTRAMUSCULAR | Status: AC
  Filled 2016-07-27: qty 1

## 2016-07-27 MED ORDER — CHLORHEXIDINE GLUCONATE CLOTH 2 % EX PADS: 6.0000 | MEDICATED_PAD | Freq: Once | CUTANEOUS | Status: AC

## 2016-07-27 MED ORDER — ONDANSETRON HCL 4 MG/2ML IJ SOLN
4.0000 mg | INTRAMUSCULAR | Status: DC | PRN
  Administered 2016-07-27: 4 mg via INTRAVENOUS
  Filled 2016-07-27: qty 2

## 2016-07-27 MED ORDER — ROCURONIUM BROMIDE 50 MG/5ML IV SOSY
PREFILLED_SYRINGE | INTRAVENOUS | Status: AC
  Filled 2016-07-27: qty 5

## 2016-07-27 MED ORDER — BUPIVACAINE-EPINEPHRINE (PF) 0.5% -1:200000 IJ SOLN
INTRAMUSCULAR | Status: AC
  Filled 2016-07-27: qty 30

## 2016-07-27 MED ORDER — HYDROMORPHONE HCL 1 MG/ML IJ SOLN
INTRAMUSCULAR | Status: AC
  Filled 2016-07-27: qty 1

## 2016-07-27 MED ORDER — PROMETHAZINE HCL 25 MG PO TABS: 12.5000 mg | ORAL_TABLET | ORAL | Status: DC | PRN

## 2016-07-27 MED ORDER — SODIUM CHLORIDE 0.9 % IR SOLN
Status: DC | PRN
  Administered 2016-07-27: 500 mL

## 2016-07-27 MED ORDER — NEOSTIGMINE METHYLSULFATE 10 MG/10ML IV SOLN
INTRAVENOUS | Status: DC | PRN
  Administered 2016-07-27: 3 mg via INTRAVENOUS
  Administered 2016-07-27: 1 mg via INTRAVENOUS

## 2016-07-27 MED ORDER — DEXAMETHASONE SODIUM PHOSPHATE 4 MG/ML IJ SOLN: 4.0000 mg | Freq: Three times a day (TID) | INTRAMUSCULAR | Status: DC

## 2016-07-27 MED ORDER — ROCURONIUM BROMIDE 100 MG/10ML IV SOLN
INTRAVENOUS | Status: DC | PRN
  Administered 2016-07-27 (×2): 50 mg via INTRAVENOUS

## 2016-07-27 MED ORDER — DEXAMETHASONE SODIUM PHOSPHATE 10 MG/ML IJ SOLN
INTRAMUSCULAR | Status: DC | PRN
  Administered 2016-07-27: 10 mg via INTRAVENOUS

## 2016-07-27 MED ORDER — ARTIFICIAL TEARS OP OINT
TOPICAL_OINTMENT | OPHTHALMIC | Status: DC | PRN
  Administered 2016-07-27: 1 via OPHTHALMIC

## 2016-07-27 MED ORDER — LACTATED RINGERS IV SOLN
INTRAVENOUS | Status: DC
  Administered 2016-07-27: 13:00:00 via INTRAVENOUS

## 2016-07-27 MED ORDER — OXYCODONE HCL 5 MG PO TABS: 5.0000 mg | ORAL_TABLET | Freq: Once | ORAL | Status: DC | PRN

## 2016-07-27 MED ORDER — FENTANYL CITRATE (PF) 100 MCG/2ML IJ SOLN
INTRAMUSCULAR | Status: AC
  Filled 2016-07-27: qty 4

## 2016-07-27 MED ORDER — DEXAMETHASONE SODIUM PHOSPHATE 10 MG/ML IJ SOLN
6.0000 mg | Freq: Four times a day (QID) | INTRAMUSCULAR | Status: AC
  Administered 2016-07-27 – 2016-07-28 (×4): 6 mg via INTRAVENOUS
  Filled 2016-07-27 (×4): qty 1

## 2016-07-27 MED ORDER — DULOXETINE HCL 20 MG PO CPEP: 40.0000 mg | ORAL_CAPSULE | Freq: Every day | ORAL | Status: DC

## 2016-07-27 MED ORDER — PHENYLEPHRINE 40 MCG/ML (10ML) SYRINGE FOR IV PUSH (FOR BLOOD PRESSURE SUPPORT)
PREFILLED_SYRINGE | INTRAVENOUS | Status: DC | PRN
  Administered 2016-07-27 (×2): 80 ug via INTRAVENOUS

## 2016-07-27 MED ORDER — HYDROMORPHONE HCL 1 MG/ML IJ SOLN: 0.2500 mg | INTRAMUSCULAR | Status: DC | PRN

## 2016-07-27 MED ORDER — VITAMIN D (ERGOCALCIFEROL) 1.25 MG (50000 UNIT) PO CAPS: 50000.0000 [IU] | ORAL_CAPSULE | ORAL | Status: DC

## 2016-07-27 MED ORDER — OXYCODONE HCL 5 MG/5ML PO SOLN: 5.0000 mg | Freq: Once | ORAL | Status: DC | PRN

## 2016-07-27 MED ORDER — INFLUENZA VAC SPLIT QUAD 0.5 ML IM SUSY
0.5000 mL | PREFILLED_SYRINGE | INTRAMUSCULAR | Status: DC
  Filled 2016-07-27 (×2): qty 0.5

## 2016-07-27 SURGICAL SUPPLY — 91 items
BENZOIN TINCTURE PRP APPL 2/3 (GAUZE/BANDAGES/DRESSINGS) IMPLANT
BLADE CLIPPER SURG (BLADE) ×2 IMPLANT
BLADE ULTRA TIP 2M (BLADE) ×2 IMPLANT
BNDG GAUZE ELAST 4 BULKY (GAUZE/BANDAGES/DRESSINGS) IMPLANT
BUR ACORN 6.0 PRECISION (BURR) ×2 IMPLANT
BUR MATCHSTICK NEURO 3.0 LAGG (BURR) IMPLANT
BUR SPIRAL ROUTER 2.3 (BUR) IMPLANT
CANISTER SUCT 3000ML PPV (MISCELLANEOUS) ×2 IMPLANT
CARTRIDGE OIL MAESTRO DRILL (MISCELLANEOUS) ×2 IMPLANT
CATH ROBINSON RED A/P 14FR (CATHETERS) IMPLANT
CHLORAPREP W/TINT 26ML (MISCELLANEOUS) ×2 IMPLANT
CLIP TI MEDIUM 6 (CLIP) IMPLANT
CONT SPEC 4OZ CLIKSEAL STRL BL (MISCELLANEOUS) ×2 IMPLANT
CONT SPECI 4OZ STER CLIK (MISCELLANEOUS) ×2 IMPLANT
COVER MAYO STAND STRL (DRAPES) ×4 IMPLANT
DIFFUSER DRILL AIR PNEUMATIC (MISCELLANEOUS) ×4 IMPLANT
DRAPE MICROSCOPE LEICA (MISCELLANEOUS) ×2 IMPLANT
DRAPE NEUROLOGICAL W/INCISE (DRAPES) ×2 IMPLANT
DRAPE SHEET LG 3/4 BI-LAMINATE (DRAPES) ×4 IMPLANT
DRAPE SURG 17X23 STRL (DRAPES) IMPLANT
DRAPE WARM FLUID 44X44 (DRAPE) ×2 IMPLANT
DRSG OPSITE 4X5.5 SM (GAUZE/BANDAGES/DRESSINGS) ×4 IMPLANT
DRSG OPSITE POSTOP 4X8 (GAUZE/BANDAGES/DRESSINGS) ×2 IMPLANT
ELECT REM PT RETURN 9FT ADLT (ELECTROSURGICAL) ×2
ELECTRODE REM PT RTRN 9FT ADLT (ELECTROSURGICAL) ×1 IMPLANT
EVACUATOR 1/8 PVC DRAIN (DRAIN) IMPLANT
EVACUATOR SILICONE 100CC (DRAIN) IMPLANT
FORCEPS BIPOLAR SPETZLER 8 1.0 (NEUROSURGERY SUPPLIES) ×2 IMPLANT
GAUZE SPONGE 4X4 12PLY STRL (GAUZE/BANDAGES/DRESSINGS) ×2 IMPLANT
GAUZE SPONGE 4X4 16PLY XRAY LF (GAUZE/BANDAGES/DRESSINGS) IMPLANT
GLOVE BIO SURGEON STRL SZ 6.5 (GLOVE) ×4 IMPLANT
GLOVE BIO SURGEON STRL SZ7 (GLOVE) ×2 IMPLANT
GLOVE BIOGEL PI IND STRL 6.5 (GLOVE) ×4 IMPLANT
GLOVE BIOGEL PI IND STRL 7.0 (GLOVE) ×1 IMPLANT
GLOVE BIOGEL PI IND STRL 7.5 (GLOVE) ×2 IMPLANT
GLOVE BIOGEL PI IND STRL 8.5 (GLOVE) ×1 IMPLANT
GLOVE BIOGEL PI INDICATOR 6.5 (GLOVE) ×4
GLOVE BIOGEL PI INDICATOR 7.0 (GLOVE) ×1
GLOVE BIOGEL PI INDICATOR 7.5 (GLOVE) ×2
GLOVE BIOGEL PI INDICATOR 8.5 (GLOVE) ×1
GLOVE ECLIPSE 8.0 STRL XLNG CF (GLOVE) ×2 IMPLANT
GLOVE EXAM NITRILE LRG STRL (GLOVE) ×2 IMPLANT
GLOVE EXAM NITRILE XL STR (GLOVE) IMPLANT
GLOVE EXAM NITRILE XS STR PU (GLOVE) IMPLANT
GLOVE SS BIOGEL STRL SZ 7.5 (GLOVE) ×2 IMPLANT
GLOVE SUPERSENSE BIOGEL SZ 7.5 (GLOVE) ×2
GOWN STRL REUS W/ TWL LRG LVL3 (GOWN DISPOSABLE) ×4 IMPLANT
GOWN STRL REUS W/ TWL XL LVL3 (GOWN DISPOSABLE) IMPLANT
GOWN STRL REUS W/TWL 2XL LVL3 (GOWN DISPOSABLE) IMPLANT
GOWN STRL REUS W/TWL LRG LVL3 (GOWN DISPOSABLE) ×4
GOWN STRL REUS W/TWL XL LVL3 (GOWN DISPOSABLE)
GRAFT DURAGEN MATRIX 1WX1L (Tissue) ×4 IMPLANT
HEMOSTAT POWDER KIT SURGIFOAM (HEMOSTASIS) ×2 IMPLANT
HEMOSTAT POWDER SURGIFOAM 1G (HEMOSTASIS) ×2 IMPLANT
HEMOSTAT SURGICEL 2X14 (HEMOSTASIS) ×2 IMPLANT
KIT BASIN OR (CUSTOM PROCEDURE TRAY) ×2 IMPLANT
KIT ROOM TURNOVER OR (KITS) ×2 IMPLANT
MARKER SKIN DUAL TIP RULER LAB (MISCELLANEOUS) ×2 IMPLANT
MARKER SPHERE PSV REFLC 13MM (MARKER) ×4 IMPLANT
NEEDLE HYPO 21X1.5 SAFETY (NEEDLE) ×4 IMPLANT
NEEDLE HYPO 25X1 1.5 SAFETY (NEEDLE) ×2 IMPLANT
NS IRRIG 1000ML POUR BTL (IV SOLUTION) ×4 IMPLANT
OIL CARTRIDGE MAESTRO DRILL (MISCELLANEOUS) ×4
PACK CRANIOTOMY (CUSTOM PROCEDURE TRAY) ×2 IMPLANT
PATTIES SURGICAL .5 X.5 (GAUZE/BANDAGES/DRESSINGS) IMPLANT
PATTIES SURGICAL .5 X3 (DISPOSABLE) IMPLANT
PATTIES SURGICAL 1X1 (DISPOSABLE) IMPLANT
PIN MAYFIELD SKULL DISP (PIN) ×4 IMPLANT
SEALANT ADHERUS EXTEND TIP (MISCELLANEOUS) ×2 IMPLANT
SPONGE LAP 18X18 X RAY DECT (DISPOSABLE) ×2 IMPLANT
SPONGE NEURO XRAY DETECT 1X3 (DISPOSABLE) IMPLANT
SPONGE SURGIFOAM ABS GEL 100 (HEMOSTASIS) ×2 IMPLANT
SPONGE SURGIFOAM ABS GEL 100C (HEMOSTASIS) ×2 IMPLANT
STAPLER VISISTAT 35W (STAPLE) ×2 IMPLANT
STOCKINETTE 6  STRL (DRAPES) ×1
STOCKINETTE 6 STRL (DRAPES) ×1 IMPLANT
SUT ETHILON 2 0 FS 18 (SUTURE) ×4 IMPLANT
SUT ETHILON 3 0 FSL (SUTURE) IMPLANT
SUT ETHILON 3 0 PS 1 (SUTURE) IMPLANT
SUT NURALON 4 0 TR CR/8 (SUTURE) ×6 IMPLANT
SUT VIC AB 0 CT1 18XCR BRD8 (SUTURE) ×2 IMPLANT
SUT VIC AB 0 CT1 8-18 (SUTURE) ×2
SUT VIC AB 2-0 CT1 18 (SUTURE) ×4 IMPLANT
SUT VIC AB 3-0 SH 8-18 (SUTURE) ×8 IMPLANT
SYR 30ML LL (SYRINGE) ×6 IMPLANT
TOWEL GREEN STERILE (TOWEL DISPOSABLE) ×2 IMPLANT
TOWEL GREEN STERILE FF (TOWEL DISPOSABLE) ×2 IMPLANT
TRAY FOLEY W/METER SILVER 16FR (SET/KITS/TRAYS/PACK) ×2 IMPLANT
TUBE CONNECTING 12X1/4 (SUCTIONS) ×4 IMPLANT
UNDERPAD 30X30 (UNDERPADS AND DIAPERS) ×2 IMPLANT
WATER STERILE IRR 1000ML POUR (IV SOLUTION) ×2 IMPLANT

## 2016-07-27 NOTE — Transfer of Care (Signed)
Immediate Anesthesia Transfer of Care Note  Patient: Kaitlyn Keith  Procedure(s) Performed: Procedure(s): Right Suboccipital craniotomy for tumor resection with stereotactic navigation (Right) APPLICATION OF CRANIAL NAVIGATION (Right)  Patient Location: PACU  Anesthesia Type:General  Level of Consciousness: awake  Airway & Oxygen Therapy: Patient Spontanous Breathing and Patient connected to face mask oxygen  Post-op Assessment: Report given to RN and Post -op Vital signs reviewed and stable  Post vital signs: Reviewed and stable  Last Vitals:  Vitals:   07/27/16 1330 07/27/16 1335  BP: 126/71 124/79  Pulse: 72 64  Resp: 16 16  Temp:      Last Pain:  Vitals:   07/27/16 1247  TempSrc: Oral         Complications: No apparent anesthesia complications

## 2016-07-27 NOTE — Anesthesia Procedure Notes (Signed)
Arterial Line Insertion Start/End3/10/2016 2:30 PM, 07/27/2016 2:50 PM Performed by: Carney Living, CRNA  Patient location: Pre-op. Preanesthetic checklist: patient identified, IV checked, site marked, risks and benefits discussed, surgical consent, monitors and equipment checked, pre-op evaluation, timeout performed and anesthesia consent Lidocaine 1% used for infiltration Left, radial was placed Catheter size: 20 Fr Hand hygiene performed  and maximum sterile barriers used   Attempts: 4 Procedure performed without using ultrasound guided technique. Following insertion, Biopatch and dressing applied. Post procedure assessment: normal  Patient tolerated the procedure well with no immediate complications.

## 2016-07-27 NOTE — Anesthesia Procedure Notes (Addendum)
Procedure Name: Intubation Date/Time: 07/27/2016 3:11 PM Performed by: Clearnce Sorrel Pre-anesthesia Checklist: Patient identified, Emergency Drugs available, Suction available, Patient being monitored and Timeout performed Patient Re-evaluated:Patient Re-evaluated prior to inductionOxygen Delivery Method: Circle System Utilized and Circle system utilized Preoxygenation: Pre-oxygenation with 100% oxygen Intubation Type: IV induction Ventilation: Mask ventilation without difficulty Laryngoscope Size: 3 and Mac Grade View: Grade I Tube type: Oral Tube size: 7.0 mm Number of attempts: 2 (1st attempt by SRNA) Airway Equipment and Method: Stylet and Oral airway Placement Confirmation: ETT inserted through vocal cords under direct vision,  positive ETCO2 and breath sounds checked- equal and bilateral Secured at: 23 cm Tube secured with: Tape Dental Injury: Teeth and Oropharynx as per pre-operative assessment

## 2016-07-27 NOTE — H&P (Signed)
24 hour update to Consult note from 07/12/16  No new symptoms Patient has undergone pre-op SRS Awake, alert, oriented Cranial nerves intact Moving arms and legs with full strength All questions answered Ready for the OR

## 2016-07-27 NOTE — Anesthesia Procedure Notes (Signed)
Central Venous Catheter Insertion Performed by: Marcie Bal Giuliana Handyside, anesthesiologist Start/End3/10/2016 1:35 PM, 07/27/2016 1:46 PM Patient location: Pre-op. Preanesthetic checklist: patient identified, IV checked, site marked, risks and benefits discussed, surgical consent, monitors and equipment checked, pre-op evaluation, timeout performed and anesthesia consent Position: Trendelenburg Lidocaine 1% used for infiltration and patient sedated Hand hygiene performed , maximum sterile barriers used  and Seldinger technique used Catheter size: 7 Fr Central line was placed.Double lumen Procedure performed using ultrasound guided technique. Ultrasound Notes:anatomy identified, needle tip was noted to be adjacent to the nerve/plexus identified and no ultrasound evidence of intravascular and/or intraneural injection Attempts: 1 Following insertion, line sutured, dressing applied and Biopatch. Post procedure assessment: blood return through all ports, free fluid flow and no air  Patient tolerated the procedure well with no immediate complications.

## 2016-07-27 NOTE — Brief Op Note (Signed)
07/27/2016  6:31 PM  PATIENT:  Kaitlyn Keith  51 y.o. female  PRE-OPERATIVE DIAGNOSIS:  BRAIN TUMOR  POST-OPERATIVE DIAGNOSIS:  BRAIN TUMOR  PROCEDURE:  Procedure(s): Right Suboccipital craniotomy for tumor resection with stereotactic navigation (Right) APPLICATION OF CRANIAL NAVIGATION (Right)  SURGEON:  Surgeon(s) and Role:    * Tamala Fothergill, MD - Primary    * Erline Levine, MD - Assisting  PHYSICIAN ASSISTANT: Ferne Reus, PA-C  ASSISTANTS: Above  ANESTHESIA:   general  EBL:  Total I/O In: 1600 [I.V.:1600] Out: 650 [Urine:500; Blood:150]  BLOOD ADMINISTERED:none  DRAINS: none   LOCAL MEDICATIONS USED:  MARCAINE    and LIDOCAINE   SPECIMEN:  Excision  DISPOSITION OF SPECIMEN:  PATHOLOGY  COUNTS:  YES  TOURNIQUET:  * No tourniquets in log *  DICTATION: .Dragon Dictation  PLAN OF CARE: Admit to inpatient   PATIENT DISPOSITION:  ICU - extubated and stable.   Delay start of Pharmacological VTE agent (>24hrs) due to surgical blood loss or risk of bleeding: yes

## 2016-07-27 NOTE — Anesthesia Preprocedure Evaluation (Signed)
Anesthesia Evaluation  Patient identified by MRN, date of birth, ID band Patient awake    Reviewed: Allergy & Precautions, H&P , NPO status , Patient's Chart, lab work & pertinent test results  Airway Mallampati: II   Neck ROM: full    Dental   Pulmonary neg pulmonary ROS,    breath sounds clear to auscultation       Cardiovascular hypertension, + Peripheral Vascular Disease   Rhythm:regular Rate:Normal     Neuro/Psych PSYCHIATRIC DISORDERS Anxiety Depression Intracranial mass  Neuromuscular disease    GI/Hepatic   Endo/Other    Renal/GU      Musculoskeletal  (+) Arthritis ,   Abdominal   Peds  Hematology   Anesthesia Other Findings   Reproductive/Obstetrics Breast CA                             Anesthesia Physical Anesthesia Plan  ASA: II  Anesthesia Plan: General   Post-op Pain Management:    Induction: Intravenous  Airway Management Planned: Oral ETT  Additional Equipment: Arterial line and CVP  Intra-op Plan:   Post-operative Plan:   Informed Consent: I have reviewed the patients History and Physical, chart, labs and discussed the procedure including the risks, benefits and alternatives for the proposed anesthesia with the patient or authorized representative who has indicated his/her understanding and acceptance.     Plan Discussed with: CRNA, Anesthesiologist and Surgeon  Anesthesia Plan Comments:         Anesthesia Quick Evaluation

## 2016-07-28 ENCOUNTER — Encounter: Payer: Self-pay | Admitting: Radiation Oncology

## 2016-07-28 ENCOUNTER — Encounter (HOSPITAL_COMMUNITY): Payer: Self-pay | Admitting: Neurological Surgery

## 2016-07-28 ENCOUNTER — Inpatient Hospital Stay (HOSPITAL_COMMUNITY): Payer: BLUE CROSS/BLUE SHIELD

## 2016-07-28 LAB — CBC
HCT: 39.7 % (ref 36.0–46.0)
Hemoglobin: 13.1 g/dL (ref 12.0–15.0)
MCH: 29.7 pg (ref 26.0–34.0)
MCHC: 33 g/dL (ref 30.0–36.0)
MCV: 90 fL (ref 78.0–100.0)
PLATELETS: 182 10*3/uL (ref 150–400)
RBC: 4.41 MIL/uL (ref 3.87–5.11)
RDW: 13.1 % (ref 11.5–15.5)
WBC: 15 10*3/uL — AB (ref 4.0–10.5)

## 2016-07-28 LAB — BASIC METABOLIC PANEL
Anion gap: 8 (ref 5–15)
BUN: 13 mg/dL (ref 6–20)
CALCIUM: 8.3 mg/dL — AB (ref 8.9–10.3)
CO2: 24 mmol/L (ref 22–32)
Chloride: 101 mmol/L (ref 101–111)
Creatinine, Ser: 0.61 mg/dL (ref 0.44–1.00)
GFR calc Af Amer: >60 mL/min (ref >60)
GLUCOSE: 131 mg/dL — AB (ref 65–99)
Potassium: 3.5 mmol/L (ref 3.5–5.1)
SODIUM: 133 mmol/L — AB (ref 135–145)

## 2016-07-28 MED ORDER — CHLORHEXIDINE GLUCONATE CLOTH 2 % EX PADS: 6.0000 | MEDICATED_PAD | Freq: Every day | CUTANEOUS | Status: DC

## 2016-07-28 MED ORDER — OXYCODONE-ACETAMINOPHEN 5-325 MG PO TABS
1.0000 | ORAL_TABLET | ORAL | Status: DC | PRN
  Administered 2016-07-28 – 2016-07-29 (×6): 1 via ORAL
  Filled 2016-07-28 (×6): qty 1

## 2016-07-28 MED ORDER — SODIUM CHLORIDE 0.9% FLUSH
10.0000 mL | Freq: Two times a day (BID) | INTRAVENOUS | Status: DC
  Administered 2016-07-28: 10 mL

## 2016-07-28 MED ORDER — SODIUM CHLORIDE 0.9% FLUSH: 10.0000 mL | INTRAVENOUS | Status: DC | PRN

## 2016-07-28 MED ORDER — GADOBENATE DIMEGLUMINE 529 MG/ML IV SOLN
20.0000 mL | Freq: Once | INTRAVENOUS | Status: AC | PRN
  Administered 2016-07-28: 17 mL via INTRAVENOUS

## 2016-07-28 NOTE — Progress Notes (Signed)
Stopped by and visited with pt, who was sitting up alert dressed in street clothes and plans to be here till Friday. She is upbeat and positive, proclaiming God is good and that, as a [employer], she is covered and protected by [employer]. She knows everyone is not as fortunate, and wishes to be a blessing to others when she leaves. Provided spiritual/emotional support and prayer -- which pt appreciated. Chaplain available for f/u.   07/28/16 1200  Clinical Encounter Type  Visited With Patient  Visit Type Initial;Psychological support;Spiritual support;Social support;Critical Care  Referral From [place] Needs Prayer;Emotional  Stress Factors  Patient Stress Factors Health changes   Gerrit Heck, Chaplain

## 2016-07-28 NOTE — Progress Notes (Signed)
Pt seen and examined.  Issues with sleeping, but states it was due to nerves.  She is feeling well otherwise.  Pain is well controlled. Denies any neurological symptoms. Tolerating po.  EXAM: Temp:  [97.2 F (36.2 C)-98.5 F (36.9 C)] 97.7 F (36.5 C) (03/07 0400) Pulse Rate:  [46-82] 82 (03/07 0700) Resp:  [12-20] 17 (03/07 0700) BP: (124-163)/(69-102) 163/99 (03/07 0700) SpO2:  [88 %-100 %] 95 % (03/07 0700) Arterial Line BP: (106-138)/(73-111) 106/90 (03/07 0330) Weight:  [83.9 kg (185 lb)] 83.9 kg (185 lb) (03/06 1247) Intake/Output      03/06 0701 - 03/07 0700 03/07 0701 - 03/08 0700   I.V. (mL/kg) 2485 (29.6)    IV Piggyback 100    Total Intake(mL/kg) 2585 (30.8)    Urine (mL/kg/hr) 1375    Blood 150    Total Output 1525     Net +1060           Upright in bed, alert, joking around with family PERRL Follows commands throughout Full strength CN grossly intact  She is overall doing very well and making great progress. Will transfer to floor. Ok to remove central line

## 2016-07-28 NOTE — Evaluation (Signed)
Physical Therapy Evaluation Patient Details Name: Kaitlyn Keith MRN: [id number] DOB: 19-Apr-1966 Today's Date: 07/28/2016   History of Present Illness  pt presents s/p R Suboccipital Crani for Tumor resection.  pt with hx of Breast CA, Anxiety, Depression, and Neuropathy.    Clinical Impression  Pt very pleasant and eager for mobility.  Pt able to ambulate into hallway with slow cautious gait, however requires only guarding for safety.  Pt's HR remained 100 - 110s throughout session.  Feel pt will continue to progress with mobility to return to home.  Will continue to follow.      Follow Up Recommendations Home health PT;Supervision - Intermittent    Equipment Recommendations  None recommended by PT    Recommendations for Other Services       Precautions / Restrictions Precautions Precautions: Fall Restrictions Weight Bearing Restrictions: No      Mobility  Bed Mobility Overal bed mobility: Modified Independent             General bed mobility comments: pt moves slowly, but without A or deficits.    Transfers Overall transfer level: Needs assistance Equipment used: None Transfers: Sit to/from Stand Sit to Stand: Min guard         General transfer comment: pt cautious, but able to come to stand without A.  Definite use of UEs.    Ambulation/Gait Ambulation/Gait assistance: Min guard Ambulation Distance (Feet): 80 Feet Assistive device: None Gait Pattern/deviations: Step-through pattern;Decreased stride length     General Gait Details: pt moves slowly with a very guarded posture.  pt indicates generally fatigued today limiting mobility.    Stairs            Wheelchair Mobility    Modified Rankin (Stroke Patients Only)       Balance Overall balance assessment: Needs assistance Sitting-balance support: No upper extremity supported;Feet supported Sitting balance-Leahy Scale: Good     Standing balance support: No upper extremity  supported;During functional activity Standing balance-Leahy Scale: Fair                               Pertinent Vitals/Pain Pain Assessment: 0-10 Pain Score: 6  Pain Location: R side of head and neck Pain Descriptors / Indicators: Headache Pain Intervention(s): Monitored during session;Premedicated before session;Repositioned    Home Living Family/patient expects to be discharged to:: Private residence Living Arrangements: Children;Other relatives (Siblings and other relatives to stay with her.) Available Help at Discharge: Family;Available 24 hours/day Type of Home: House Home Access: Ramped entrance     Home Layout: One level Home Equipment: None      Prior Function Level of Independence: Independent               Hand Dominance        Extremity/Trunk Assessment   Upper Extremity Assessment Upper Extremity Assessment: Defer to OT evaluation    Lower Extremity Assessment Lower Extremity Assessment: Generalized weakness (Hx of Neuropathies with Bil tingling.)    Cervical / Trunk Assessment Cervical / Trunk Assessment: Normal  Communication   Communication: No difficulties  Cognition Arousal/Alertness: Awake/alert Behavior During Therapy: WFL for tasks assessed/performed Overall Cognitive Status: Within Functional Limits for tasks assessed                      General Comments      Exercises     Assessment/Plan    PT Assessment Patient needs continued PT  services  PT Problem List Decreased strength;Decreased activity tolerance;Decreased balance;Decreased mobility;Decreased coordination;Decreased knowledge of use of DME       PT Treatment Interventions DME instruction;Gait training;Stair training;Functional mobility training;Therapeutic activities;Therapeutic exercise;Balance training;Neuromuscular re-education;Patient/family education    PT Goals (Current goals can be found in the Care Plan section)  Acute Rehab PT  Goals Patient Stated Goal: Home PT Goal Formulation: With patient Time For Goal Achievement: 08/11/16 Potential to Achieve Goals: Good    Frequency Min 3X/week   Barriers to discharge        Co-evaluation               End of Session Equipment Utilized During Treatment: Gait belt Activity Tolerance: Patient limited by fatigue Patient left: in bed (sitting EOB with OT.) Nurse Communication: Mobility status PT Visit Diagnosis: Unsteadiness on feet (R26.81)         Time: 1761-6073 PT Time Calculation (min) (ACUTE ONLY): 19 min   Charges:   PT Evaluation $PT Eval Moderate Complexity: 1 Procedure     PT G CodesThornton Papas [name], PT  [phone] 07/28/2016, 11:06 AM

## 2016-07-28 NOTE — Evaluation (Signed)
Occupational Therapy Evaluation Patient Details Name: Kaitlyn Keith MRN: [id number] DOB: Oct 28, 1965 Today's Date: 07/28/2016    History of Present Illness pt presents s/p R Suboccipital Crani for Tumor resection.  pt with hx of Breast CA, Anxiety, Depression, and Neuropathy.     Clinical Impression   Patient is s/p R suboccipital craniotmy surgery resulting in functional limitations due to the deficits listed below (see OT problem list). PTA was independent with all adls. Pt without visual deficits noted during initial evaluation but will continue to assess further with more functional task.  Patient will benefit from skilled OT acutely to increase independence and safety with ADLS to allow discharge hhot. Pt currently with balance deficits during toilet transfer and walking with hands extended for guarding.      Follow Up Recommendations  Home health OT    Equipment Recommendations  None recommended by OT    Recommendations for Other Services       Precautions / Restrictions Precautions Precautions: Fall Restrictions Weight Bearing Restrictions: No      Mobility Bed Mobility Overal bed mobility: Modified Independent                Transfers Overall transfer level: Needs assistance Equipment used: None Transfers: Sit to/from Stand Sit to Stand: Min guard         General transfer comment: pt cautious, but able to come to stand without A.  Definite use of UEs.      Balance Overall balance assessment: Needs assistance Sitting-balance support: No upper extremity supported;Feet supported Sitting balance-Leahy Scale: Good     Standing balance support: No upper extremity supported;During functional activity Standing balance-Leahy Scale: Fair                              ADL Overall ADL's : Needs assistance/impaired     Grooming: Wash/dry face;Supervision/safety;Sitting;Brushing hair   Upper Body Bathing: Min guard;Sitting   Lower  Body Bathing: Min guard   Upper Body Dressing : Min guard   Lower Body Dressing: Min guard   Toilet Transfer: Minimal assistance           Functional mobility during ADLs: Minimal assistance General ADL Comments: pt needed steady assist for bathroom transfer     Vision   Vision Assessment?: No apparent visual deficits     Perception     Praxis      Pertinent Vitals/Pain Pain Assessment: 0-10 Pain Score: 6  Pain Location: R side of head and neck Pain Descriptors / Indicators: Headache Pain Intervention(s): Monitored during session;Premedicated before session;Repositioned     Hand Dominance Right   Extremity/Trunk Assessment Upper Extremity Assessment Upper Extremity Assessment: Overall WFL for tasks assessed   Lower Extremity Assessment Lower Extremity Assessment: Defer to PT evaluation   Cervical / Trunk Assessment Cervical / Trunk Assessment: Normal   Communication Communication Communication: No difficulties   Cognition Arousal/Alertness: Awake/alert Behavior During Therapy: WFL for tasks assessed/performed Overall Cognitive Status: Within Functional Limits for tasks assessed                     General Comments       Exercises       Shoulder Instructions      Home Living Family/patient expects to be discharged to:: Private residence Living Arrangements: Children;Other relatives (Siblings and other relatives to stay with her.) Available Help at Discharge: Family;Available 24 hours/day Type of Home: House Home Access: Ramped  entrance     Home Layout: One level     Bathroom Shower/Tub: Occupational psychologist: Handicapped height     Home Equipment: None          Prior Functioning/Environment Level of Independence: Independent                 OT Problem List: Decreased strength;Decreased activity tolerance;Impaired balance (sitting and/or standing);Decreased knowledge of precautions      OT  Treatment/Interventions: Self-care/ADL training;Therapeutic exercise;Neuromuscular education;DME and/or AE instruction;Therapeutic activities;Patient/family education;Balance training    OT Goals(Current goals can be found in the care plan section) Acute Rehab OT Goals Patient Stated Goal: Home OT Goal Formulation: With patient Time For Goal Achievement: 08/11/16 Potential to Achieve Goals: Good  OT Frequency: Min 2X/week   Barriers to D/C:            Co-evaluation              End of Session Equipment Utilized During Treatment: Gait belt Nurse Communication: Mobility status;Precautions  Activity Tolerance: Patient tolerated treatment well Patient left: in bed;with call bell/phone within reach;with family/visitor present  OT Visit Diagnosis: Unsteadiness on feet (R26.81)                ADL either performed or assessed with clinical judgement  Time: 9643-8381 OT Time Calculation (min): 16 min Charges:  OT General Charges $OT Visit: 1 Procedure OT Evaluation $OT Eval Moderate Complexity: 1 Procedure G-Codes:      Jeri Modena   OTR/L Pager: [phone] Office: [phone] .   Parke Poisson B 07/28/2016, 2:29 PM

## 2016-07-28 NOTE — Anesthesia Postprocedure Evaluation (Signed)
Anesthesia Post Note  Patient: Kaitlyn Keith  Procedure(s) Performed: Procedure(s) (LRB): Right Suboccipital craniotomy for tumor resection with stereotactic navigation (Right) APPLICATION OF CRANIAL NAVIGATION (Right)  Patient location during evaluation: PACU Anesthesia Type: General Level of consciousness: awake and alert and patient cooperative Pain management: pain level controlled Vital Signs Assessment: post-procedure vital signs reviewed and stable Respiratory status: spontaneous breathing and respiratory function stable Cardiovascular status: stable Anesthetic complications: no       Last Vitals:  Vitals:   07/28/16 1700 07/28/16 1800  BP: 108/65 (!) 140/93  Pulse: 75 78  Resp: (!) 24 19  Temp:      Last Pain:  Vitals:   07/28/16 1605  TempSrc:   PainSc: 5                  Mercedes Valeriano S

## 2016-07-28 NOTE — Progress Notes (Signed)
  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     Indication for treatment:  Curative       Radiation treatment dates:  07/26/16  Site/dose:   PTV1 Right Cerebellum/ 18 Gy in 1 fraction  Beams/energy:   SBRT SRT-3D / 6FFF  Narrative: The patient tolerated radiation treatment relatively well.   Plan: The patient has completed radiation treatment. She has surgery planned after radaition. The patient will return to radiation oncology clinic for routine followup in one month. I advised her to call or return sooner if she has any questions or concerns related to her recovery or treatment. ________________________________  Sheral Apley. Tammi Klippel, M.D.   This document serves as a record of services personally performed by Tyler Pita, MD. It was created on his behalf by Bethann Humble, a trained medical scribe. The creation of this record is based on the scribe's personal observations and the provider's statements to them. This document has been checked and approved by the attending provider.

## 2016-07-29 LAB — GLUCOSE, CAPILLARY: GLUCOSE-CAPILLARY: 83 mg/dL (ref 65–99)

## 2016-07-29 MED ORDER — SODIUM CHLORIDE 0.9 % IV SOLN: INTRAVENOUS | Status: DC

## 2016-07-29 MED ORDER — PANTOPRAZOLE SODIUM 40 MG PO TBEC: 40.0000 mg | DELAYED_RELEASE_TABLET | Freq: Every day | ORAL | Status: DC

## 2016-07-29 MED ORDER — METHYLPREDNISOLONE 4 MG PO TBPK: ORAL_TABLET | ORAL | 0 refills | Status: DC

## 2016-07-29 MED ORDER — OXYCODONE-ACETAMINOPHEN 7.5-325 MG PO TABS: ORAL_TABLET | ORAL | 0 refills | Status: DC

## 2016-07-29 NOTE — Progress Notes (Signed)
Patient is discharged from room 5C06 at this time. Alert and in stable condition. IV site d/c'd and instructions read to patient and sister with understanding verbalized. All questions answered. Left unit via wheelchair with all belongings at side.

## 2016-07-29 NOTE — Progress Notes (Signed)
Physical Therapy Treatment Patient Details Name: Kaitlyn Keith MRN: [id number] DOB: 09-Aug-1965 Today's Date: 07/29/2016    History of Present Illness pt presents s/p R Suboccipital Crani for Tumor resection.  pt with hx of Breast CA, Anxiety, Depression, and Neuropathy.      PT Comments    Pt moving well today and much more confident than yesterday when this PT saw pt.  Pt up independently within room and demonstrates great awareness of safety when ambulating in hallway.  Discussed activity level at home.  Feel pt is ready for D/C from PT stand point.     Follow Up Recommendations  No PT follow up;Supervision - Intermittent     Equipment Recommendations  None recommended by PT    Recommendations for Other Services       Precautions / Restrictions Precautions Precautions: Fall Restrictions Weight Bearing Restrictions: No    Mobility  Bed Mobility                  Transfers                 General transfer comment: pt up performing and ADLs in room.    Ambulation/Gait Ambulation/Gait assistance: Modified independent (Device/Increase time) Ambulation Distance (Feet): 300 Feet Assistive device: None Gait Pattern/deviations: Step-through pattern;Decreased stride length     General Gait Details: pt continues to move more slowly than her baseline, but demonstrates safe mobility.     Stairs            Wheelchair Mobility    Modified Rankin (Stroke Patients Only)       Balance Overall balance assessment: Needs assistance Sitting-balance support: No upper extremity supported;Feet supported       Standing balance support: No upper extremity supported;During functional activity Standing balance-Leahy Scale: Good                      Cognition Arousal/Alertness: Awake/alert Behavior During Therapy: WFL for tasks assessed/performed Overall Cognitive Status: Within Functional Limits for tasks assessed                      Exercises      General Comments        Pertinent Vitals/Pain Pain Assessment: 0-10 Pain Score: 3  Pain Location: R side of head and neck Pain Descriptors / Indicators: Headache Pain Intervention(s): Monitored during session;Premedicated before session;Repositioned    Home Living                      Prior Function            PT Goals (current goals can now be found in the care plan section) Acute Rehab PT Goals Patient Stated Goal: Home PT Goal Formulation: With patient Time For Goal Achievement: 08/11/16 Potential to Achieve Goals: Good Progress towards PT goals: Progressing toward goals    Frequency    Min 3X/week      PT Plan Discharge plan needs to be updated    Co-evaluation             End of Session   Activity Tolerance: Patient tolerated treatment well Patient left: in chair;with call bell/phone within reach Nurse Communication: Mobility status PT Visit Diagnosis: Unsteadiness on feet (R26.81)     Time: 8841-6606 PT Time Calculation (min) (ACUTE ONLY): 13 min  Charges:  $Gait Training: 8-22 mins  G Codes:       [place], [place] [phone] 07/29/2016, 10:05 AM

## 2016-07-29 NOTE — Discharge Summary (Signed)
Physician Discharge Summary  Patient ID: Kaitlyn Keith MRN: [id number] DOB/AGE: 49-26-1967 49 y.o.  Admit date: 07/27/2016 Discharge date: 07/29/2016  Admission Diagnoses:  Cerebellar mass  Discharge Diagnoses:  Same Active Problems:   Cerebellar mass   Cerebellar tumor [facility])   Discharged Condition: Stable  Hospital Course:  Kaitlyn Keith is a 51 y.o. female was admitted to hospital for Right suboccipital craniotomy for tumor resection with stereotactic navigation. Patient tolerated procedure well. Uncomplicated post operative course. She was up ambulating, tolerating po, pain well controlled at time of discharge.  Discharge Exam: Blood pressure 116/81, pulse 65, temperature 98.1 F (36.7 C), temperature source Oral, resp. rate 17, height 5\' 3"  (1.6 m), weight 83.9 kg (185 lb), SpO2 98 %. Awake, alert, oriented Speech fluent, appropriate CN grossly intact 5/5 BUE/BLE Wound c/d/i  Disposition: 01-Home or Self Care  Discharge Instructions    Call MD for:  difficulty breathing, headache or visual disturbances    Complete by:  As directed    Call MD for:  persistant dizziness or light-headedness    Complete by:  As directed    Call MD for:  redness, tenderness, or signs of infection (pain, swelling, redness, odor or green/yellow discharge around incision site)    Complete by:  As directed    Call MD for:  severe uncontrolled pain    Complete by:  As directed    Call MD for:  temperature >100.4    Complete by:  As directed    Diet general    Complete by:  As directed    Driving Restrictions    Complete by:  As directed    Do not drive until given clearance.   Home Health    Complete by:  As directed    To provide the following care/treatments:   PT OT     Increase activity slowly    Complete by:  As directed    Lifting restrictions    Complete by:  As directed    Do not lift anything >10lbs. Avoid bending and twisting in awkward positions. Avoid  bending at the back.   May shower / Bathe    Complete by:  As directed    As long as wounds are covered with waterproof dressing   Remove dressing in 24 hours    Complete by:  As directed      Allergies as of 07/29/2016   No Known Allergies     Medication List    STOP taking these medications   dexamethasone 4 MG tablet Commonly known as:  DECADRON   oxyCODONE-acetaminophen 5-325 MG tablet Commonly known as:  PERCOCET/ROXICET Replaced by:  oxyCODONE-acetaminophen 7.5-325 MG tablet     TAKE these medications   ALPRAZolam 1 MG tablet Commonly known as:  XANAX Take 2 tablets (2 mg total) by mouth at bedtime. What changed:  how much to take  when to take this  reasons to take this   DULoxetine 20 MG capsule Commonly known as:  CYMBALTA Take 2 capsules (40 mg total) by mouth daily.   gabapentin 400 MG capsule Commonly known as:  NEURONTIN Take 400 mg (1tab) PO TID   lisinopril-hydrochlorothiazide 20-12.5 MG tablet Commonly known as:  PRINZIDE,ZESTORETIC Take 1 tablet by mouth 2 (two) times daily.   LORazepam 1 MG tablet Commonly known as:  ATIVAN TAKE ONE TABLET BY MOUTH EVERY 6 HOURS AS NEEDED FOR NAUSEA AND VOMITING   methylPREDNISolone 4 MG Tbpk tablet Commonly known as:  MEDROL  Take according to package insert   oxyCODONE-acetaminophen 7.5-325 MG tablet Commonly known as:  PERCOCET Take 1-2 tablets by mouth every 4 hours as needed for pain. Replaces:  oxyCODONE-acetaminophen 5-325 MG tablet   Vitamin D (Ergocalciferol) 50000 units Caps capsule Commonly known as:  DRISDOL Take 1 capsule (50,000 Units total) by mouth every 7 (seven) days. What changed:  additional instructions        Signed: Traci Sermon 07/29/2016, 9:03 AM

## 2016-07-29 NOTE — Progress Notes (Signed)
Doing great No issues D/c

## 2016-07-29 NOTE — Progress Notes (Signed)
Occupational Therapy Treatment Patient Details Name: Kaitlyn Keith MRN: [id number] DOB: 1965/12/12 Today's Date: 07/29/2016    History of present illness pt presents s/p R Suboccipital Crani for Tumor resection.  pt with hx of Breast CA, Anxiety, Depression, and Neuropathy.     OT comments  Pt able to get self bathed and dressed this am. Pt expressing concerns about her ability to "do her job" and "be as smart as before". Pt became tearful talking about her medical history and how anxious she is about not being "normal" again. Pt comforted and educated that many factors, including stress, medication and sleep deprivation could be affecting her cognitively at this time. Recommended that pt resume normal activities and that if she has concerns regarding her ability to resume her PLOF that she discuss this with her MD and seek counseling to help her deal with her expressed concerns and possible follow up with a neuropsychologist if she finds that she is having difficulty cognitively. Pt may benefit from outpt OT prior to return to work pending process. Pt very appreciative.   Follow Up Recommendations  Other (comment) (possible neuro outpt follow up after follow up with MD)  Recommend S for medication management and financial management. (pt states someone will assist with these tasks)   Equipment Recommendations       Recommendations for Other Services      Precautions / Restrictions Precautions Precautions: Fall Restrictions Weight Bearing Restrictions: No       Mobility Bed Mobility Overal bed mobility: Modified Independent             General bed mobility comments: pt moves slowly, but without A or deficits.    Transfers Overall transfer level: Modified independent               General transfer comment: pt up performing and ADLs in room.      Balance Overall balance assessment: Modified Independent Sitting-balance support: No upper extremity  supported;Feet supported       Standing balance support: No upper extremity supported;During functional activity Standing balance-Leahy Scale: Good                     ADL                                         General ADL Comments: Pt dressed self and up in room walking on arrival.      Vision                     Perception     Praxis      Cognition   Behavior During Therapy: [facility] for tasks assessed/performed Overall Cognitive Status: Within Functional Limits for tasks assessed                         Exercises     Shoulder Instructions       General Comments      Pertinent Vitals/ Pain       Pain Assessment: 0-10 Pain Score: 6  Pain Location: R side of head and neck Pain Descriptors / Indicators: Sore Pain Intervention(s): Limited activity within patient's tolerance  Home Living  Prior Functioning/Environment              Frequency           Progress Toward Goals  OT Goals(current goals can now be found in the care plan section)  Progress towards OT goals: Goals met/education completed, patient discharged from OT  Acute Rehab OT Goals Patient Stated Goal: Home OT Goal Formulation: With patient Time For Goal Achievement: 08/11/16 Potential to Achieve Goals: Good ADL Goals Pt Will Perform Lower Body Dressing: with supervision;sit to/from stand Pt Will Transfer to Toilet: with supervision;regular height toilet;ambulating Pt Will Perform Tub/Shower Transfer: with supervision;ambulating  Plan Discharge plan needs to be updated    Co-evaluation                 End of Session    OT Visit Diagnosis: Unsteadiness on feet (R26.81)   Activity Tolerance Patient tolerated treatment well   Patient Left with nursing/sitter in room;Other (comment) (sitting on couch. With nurse)   Nurse Communication Other (comment) (pt asking questions  about wound management)        Time: 8416-6063 OT Time Calculation (min): 16 min  Charges: OT General Charges $OT Visit: 1 Procedure OT Treatments $Therapeutic Activity: 8-22 mins  [facility], OT/L  [phone] 07/29/2016   Izel Eisenhardt,HILLARY 07/29/2016, 11:23 AM

## 2016-08-02 ENCOUNTER — Other Ambulatory Visit: Payer: Self-pay | Admitting: Hematology and Oncology

## 2016-08-02 DIAGNOSIS — C50511 Malignant neoplasm of lower-outer quadrant of right female breast: Secondary | ICD-10-CM

## 2016-08-03 ENCOUNTER — Telehealth: Payer: Self-pay | Admitting: *Deleted

## 2016-08-03 ENCOUNTER — Encounter: Payer: BLUE CROSS/BLUE SHIELD | Admitting: Genetic Counselor

## 2016-08-03 ENCOUNTER — Ambulatory Visit: Payer: BLUE CROSS/BLUE SHIELD | Admitting: Hematology and Oncology

## 2016-08-03 ENCOUNTER — Other Ambulatory Visit: Payer: BLUE CROSS/BLUE SHIELD

## 2016-08-03 NOTE — Telephone Encounter (Signed)
Scheduled and confirmed new appt with Dr. Lindi Adie on 08/10/16 at 1115.

## 2016-08-03 NOTE — Telephone Encounter (Signed)
Called pt to schedule new appt with Dr. Lindi Adie d/t missed appt. Unable to leave msg will call again.

## 2016-08-03 NOTE — Assessment & Plan Note (Deleted)
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 Neoadj chemo 12/24/14- 04/29/15 AC x 4 foll by Abraxane X 12 Rt Lumpectomy: Path CR 0/2 LN Adj XRT 07/23/15- 09/08/15  Cerebellar mass diagnosed 07/12/2016: Right suboccipital craniotomy for tumor resection with stereotactic navigation 07/29/16: Metastatic poorly differentiated adenocarcinoma with extensive necrosis positive for CK 7, MOC 31, CK 5/6; Neg for Er/PR, GATA-3, GCDFP CDX2, Napsin A and TTF-1  CT CAP: 07/12/2016: No evidence of metastatic disease in chest abdomen or pelvis scattered nonspecific hypodensities within the liver measuring 1 cm stable from 2006  Recommendations: 1. Radiation therapy to the brain 2. followed by surveillance and observation I discussed the patient that the lesion in the cerebellum was poorly differentiated and does not have any specific characteristics to pinpoint the likely primary. However based upon her prior history of triple negative breast cancer, I would attribute the lesion the brain to be metastatic breast cancer.  Since that is no evidence of systemic disease, I'm not recommending systemic chemotherapy at this time. She will need to be followed and watched closely for development of additional lesions anywhere else. I plan to see her back in 3 months with a CT of 4 chest abdomen and pelvis and MRI of the brain.

## 2016-08-10 ENCOUNTER — Ambulatory Visit: Payer: BLUE CROSS/BLUE SHIELD | Admitting: Hematology and Oncology

## 2016-08-10 ENCOUNTER — Telehealth: Payer: Self-pay

## 2016-08-10 NOTE — Telephone Encounter (Signed)
Called pt let her know if missed appt. Spoke with pt and is very sorry that she had forgotten her appt today. Pt s/p brain surgery and has been having a lot of post op pain and issues. Pt scheduled to see surgeon tomorrow for follow up. Pt has difficulty remembering. Pt rescheduled to see Dr.Gudena next week. Pt verbally confirmed appt 3/28 at 330pm. Notifed Dawn (navigator) and is aware.

## 2016-08-10 NOTE — Assessment & Plan Note (Deleted)
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 Neoadj chemo 12/24/14- 04/29/15 AC x 4 foll by Abraxane X 12 Rt Lumpectomy: Path CR 0/2 LN Adj XRT 07/23/15- 09/08/15  Cerebellar mass diagnosed 07/12/2016: Right suboccipital craniotomy for tumor resection with stereotactic navigation 07/29/16: Metastatic poorly differentiated adenocarcinoma with extensive necrosis positive for CK 7, MOC 31, CK 5/6; Neg for Er/PR, GATA-3, GCDFP CDX2, Napsin A and TTF-1  CT CAP: 07/12/2016: No evidence of metastatic disease in chest abdomen or pelvis scattered nonspecific hypodensities within the liver measuring 1 cm stable from 2006  Recommendations: 1. Radiation therapy to the brain 2. followed by surveillance and observation I discussed the patient that the lesion in the cerebellum was poorly differentiated and does not have any specific characteristics to pinpoint the likely primary. However based upon her prior history of triple negative breast cancer, I would attribute the lesion the brain to be metastatic breast cancer.  Since that is no evidence of systemic disease, I'm not recommending systemic chemotherapy at this time. She will need to be followed and watched closely for development of additional lesions anywhere else. I plan to see her back in 3 months with a CT of 4 chest abdomen and pelvis and MRI of the brain.

## 2016-08-12 NOTE — Op Note (Signed)
07/27/2016  9:04 AM  PATIENT:  Kaitlyn Keith  51 y.o. female  PRE-OPERATIVE DIAGNOSIS:  Right cerebellar brain metastasis  POST-OPERATIVE DIAGNOSIS:  Same  PROCEDURE:  Right retrosigmoid craniectomy for tumor resection, use of operating microscope, use of stereotactic navigation  SURGEON:  Aldean Ast, MD  ASSISTANTS: Erline Levine, MD  ANESTHESIA:   General  DRAINS: None   SPECIMEN:  Brain tumor  INDICATION FOR PROCEDURE: 50 year old woman with a cerebellar metastasis who has had pre-operative radiation. Patient understood the risks, benefits, and alternatives and potential outcomes and wished to proceed.  PROCEDURE DETAILS: After smooth induction of general endotracheal anesthesia the patient was positioned in the park bench position with the right side up and the head was fixated in [place] pins. The BrainLab stereotactic system was registered. The patient's hair was clipped behind the right ear and an incision was planned using stereotactic navigation. The skin was wiped down with alcohol and lidocaine and Marcaine with epinephrine was injected. The patient was prepped and draped in the usual sterile fashion.  A C-shaped incision was made behind the right ear. Soft tissue was dissected monopolar cautery and reflected forward. BrainLab was used to identify the location of the tumor as well as the transverse and sigmoid sinuses. Using a high-speed bur the skull was thinned and the transverse and sigmoid sinuses were partially skeletonized.  Air cells were sealed with bone wax. The operating microscope was draped and brought into the field to provide light magnification. Utilizing microsurgical technique the dura was opened with an anchor-shaped incision with the stem pointing just below the transverse sigmoid junction.  I used the BrainLab stereotactic navigation system to identify where the tumor came closest to the surface. A corticotomy was performed here and the tumor  was readily evident. I took specimen which was sent for frozen analysis. This was consistent with metastatic carcinoma. I then worked around the borders of the tumor to define its margins. I internally debulked it to give more room to continue working around the borders.  I was unable to remove the tumor in a piecemeal fashion. There was a small calcified piece of the tumor adherent to the inside of the sigmoid sinus. It was determined to be too dangerous to attempt to remove this. I once again used the BrainLab stereotactic navigation system to confirm that I had reached all margins of the tumor. I was satisfied that this was the case.  I irrigated vigorously. I obtained hemostasis. I placed Surgicel in the depths of the resection cavity. I closed the dura as much as I could primarily and then used an onlay graft and sealed this with Adherus.  I again applied bone wax to the air cells. The scalp was then closed in routine anatomic layers with interrupted Vicryl sutures. The skin was closed with a running locked nylon suture.  A sterile dressing was applied.  The patient was taken out of [place] pins and returned to the supine position on the stretcher. She awoke without issue.  PATIENT DISPOSITION:  ICU - extubated and stable.   Delay start of Pharmacological VTE agent (>24hrs) due to surgical blood loss or risk of bleeding:  yes

## 2016-08-18 ENCOUNTER — Ambulatory Visit (HOSPITAL_BASED_OUTPATIENT_CLINIC_OR_DEPARTMENT_OTHER): Payer: BLUE CROSS/BLUE SHIELD | Admitting: Hematology and Oncology

## 2016-08-18 ENCOUNTER — Encounter: Payer: Self-pay | Admitting: Hematology and Oncology

## 2016-08-18 DIAGNOSIS — Z171 Estrogen receptor negative status [ER-]: Secondary | ICD-10-CM | POA: Diagnosis not present

## 2016-08-18 DIAGNOSIS — C50511 Malignant neoplasm of lower-outer quadrant of right female breast: Secondary | ICD-10-CM

## 2016-08-18 DIAGNOSIS — C7931 Secondary malignant neoplasm of brain: Secondary | ICD-10-CM

## 2016-08-18 NOTE — Progress Notes (Signed)
Patient Care Team: Secundino Ginger, [name], [name], [name], [name], [name], [name], NP as Nurse Practitioner (Nurse Practitioner)  DIAGNOSIS:  Encounter Diagnosis  Name Primary?  . 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, estrogen receptor negative ([place])     SUMMARY OF ONCOLOGIC HISTORY:   Breast cancer of lower-outer quadrant of right female breast ([place])   12/03/2014 Mammogram    Right breast mass 1.9 cm it o'clock position 8 cm depth from the nipple      12/03/2014 Initial Diagnosis    Right breast biopsy 8:00: Invasive ductal carcinoma, grade 3, ER 0%, PR 0%, Ki-67 90%, HER-2 negative ratio 1.43      12/10/2014 Breast MRI    Right breast lower outer quadrant: 2.3 x 2.4 x 2.4 cm rim-enhancing mass abuts the pectoralis fascia but no enhancement of pectoralis muscle, second focus of artifact?'s second tissue marker clip, no lymph nodes      12/10/2014 Clinical Stage    Stage IIA: T2 N0      12/24/2014 - 04/29/2015 Neo-Adjuvant Chemotherapy    Dose dense Adriamycin and Cytoxan 4 followed by weekly Abraxane 12      05/02/2015 Breast MRI    complete radiologic response      06/16/2015 Surgery    Left Lumpectomy: Complete path Response, 0/2 LN      06/16/2015 Pathologic Stage    ypT0 ypN0      07/23/2015 - 09/05/2015 Radiation Therapy    Adjuvant RT: 50.4 Gy in 28 fractions and a boost of 10 Gy in 5 fractions to total dose of 60.4 Gy      10/24/2015 Survivorship    SCP mailed to patient in lieu of in person visit.      07/26/2016 - 07/27/2016 Radiation Therapy     SRS brain      07/27/2016 - 07/29/2016 Hospital Admission    Cerebellar mass: Right suboccipital craniotomy for  tumor resection with stereotactic navigation: Metastatic poorly differentiated adenocarcinoma with extensive necrosis positive for CK 7, MOC 31, CK 5/6; Neg for Er/PR, GATA-3, GCDFP CDX2, Napsin A and TTF-1      CHIEF COMPLIANT: Follow-up after stereotactic radiosurgery  INTERVAL HISTORY: Kaitlyn Keith is a 51 year old with above-mentioned history of metastatic breast cancer. Patient had a solitary cerebellar met that was resected and subsequently had stereotactic radiosurgery. MRI performed after the SRS revealed no evidence of disease. CT chest abdomen pelvis done in February 2018 did not reveal any evidence of systemic metastatic disease. She is here today to discuss overall treatment plan. She has been tapered off the steroids. When she was fully tapered she had headaches and she'll she had to be her back on methylprednisone. She tells me that steroids make it very difficult for her to go to sleep. She is planning on a vacation to go [place] with her fianc. This is happening and of May 2018.   REVIEW OF SYSTEMS:   Constitutional: Denies fevers, chills or abnormal weight loss Eyes: Denies blurriness of vision Ears, nose, mouth, throat, and face: Denies mucositis or sore throat Respiratory: Denies cough, dyspnea or wheezes Cardiovascular: Denies palpitation, chest discomfort Gastrointestinal:  Denies nausea, heartburn or change in bowel habits Skin: Denies abnormal skin rashes Lymphatics:  Denies new lymphadenopathy or easy bruising Neurological: Swelling around the right occipital area Behavioral/Psych: Mood is stable, no new changes  Extremities: No lower extremity edema  All other systems were reviewed with the patient and are negative.  I have reviewed the past medical history, past surgical history, social history and family history with the patient and they are unchanged from previous note.  ALLERGIES:  has No Known Allergies.  MEDICATIONS:  Current Outpatient  Prescriptions  Medication Sig Dispense Refill  . ALPRAZolam (XANAX) 1 MG tablet Take 2 tablets (2 mg total) by mouth at bedtime. (Patient taking differently: Take 1 mg by mouth 2 (two) times daily as needed for anxiety or sleep. ) 60 tablet 0  . DULoxetine (CYMBALTA) 20 MG capsule Take 2 capsules (40 mg total) by mouth daily. (Patient not taking: Reported on 07/19/2016) 30 capsule 3  . gabapentin (NEURONTIN) 400 MG capsule Take 400 mg (1tab) PO TID (Patient not taking: Reported on 07/19/2016) 100 capsule 1  . lisinopril-hydrochlorothiazide (PRINZIDE,ZESTORETIC) 20-12.5 MG tablet Take 1 tablet by mouth 2 (two) times daily. 60 tablet 6  . LORazepam (ATIVAN) 1 MG tablet TAKE ONE TABLET BY MOUTH EVERY 6 HOURS AS NEEDED FOR NAUSEA AND VOMITING (Patient not taking: Reported on 07/16/2016) 30 tablet 0  . methylPREDNISolone (MEDROL) 4 MG TBPK tablet Take according to package insert 21 tablet 0  . oxyCODONE-acetaminophen (PERCOCET) 7.5-325 MG tablet Take 1-2 tablets by mouth every 4 hours as needed for pain. 60 tablet 0  . Vitamin D, Ergocalciferol, (DRISDOL) 50000 UNITS CAPS capsule Take 1 capsule (50,000 Units total) by mouth every 7 (seven) days. (Patient taking differently: Take 50,000 Units by mouth every 7 (seven) days. mondays) 12 capsule 3   No current facility-administered medications for this visit.     PHYSICAL EXAMINATION: ECOG PERFORMANCE STATUS: 1 - Symptomatic but completely ambulatory  Vitals:   08/18/16 1608  BP: 129/88  Pulse: (!) 103  Resp: 19  Temp: 98.2 F (36.8 C)   Filed Weights   08/18/16 1608  Weight: 181 lb 4.8 oz (82.2 kg)    GENERAL:alert, no distress and comfortable SKIN: skin color, texture, turgor are normal, no rashes or significant lesions EYES: normal, Conjunctiva are pink and non-injected, sclera clear OROPHARYNX:no exudate, no erythema and lips, buccal mucosa, and tongue normal  NECK: supple, thyroid normal size, non-tender, without nodularity LYMPH:  no  palpable lymphadenopathy in the cervical, axillary or inguinal LUNGS: clear to auscultation and percussion with normal breathing effort HEART: regular rate & rhythm and no murmurs and no lower extremity edema ABDOMEN:abdomen soft, non-tender and normal bowel sounds MUSCULOSKELETAL:no cyanosis of digits and no clubbing  NEURO: alert & oriented x 3 with fluent speech, no focal motor/sensory deficits EXTREMITIES: No lower extremity edema  LABORATORY DATA:  I have reviewed the data as listed   Chemistry      Component Value Date/Time   NA 133 (L) 07/28/2016 0700   NA 139 06/23/2015 1352   K 3.5 07/28/2016 0700   K 3.9 06/23/2015 1352   CL 101 07/28/2016 0700   CO2 24 07/28/2016 0700   CO2 24 06/23/2015 1352   BUN 13 07/28/2016 0700   BUN 14.9 06/23/2015 1352   CREATININE 0.61 07/28/2016 0700   CREATININE 0.8 06/23/2015 1352      Component Value Date/Time   CALCIUM 8.3 (L) 07/28/2016 0700   CALCIUM 9.0 06/23/2015 1352   ALKPHOS 91 06/23/2015 1352   AST 13 06/23/2015 1352   ALT 13 06/23/2015  1352   BILITOT 0.41 06/23/2015 1352       Lab Results  Component Value Date   WBC 15.0 (H) 07/28/2016   HGB 13.1 07/28/2016   HCT 39.7 07/28/2016   MCV 90.0 07/28/2016   PLT 182 07/28/2016   NEUTROABS 4.3 07/12/2016    ASSESSMENT & PLAN:  Breast cancer of lower-outer quadrant of right female breast ([place]) Right breast biopsy 12/03/2014 8:00: Invasive ductal carcinoma, grade 3, ER 0%, PR 0%, 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 Neoadj chemo 12/24/14- 04/29/15 AC x 4 foll by Abraxane X 12 Rt Lumpectomy: Path CR 0/2 LN Adj XRT 07/23/15- 09/08/15  Cerebellar mass diagnosed 07/12/2016: Right suboccipital craniotomy for tumor resection with stereotactic navigation 07/29/16: Metastatic poorly differentiated adenocarcinoma with extensive necrosis positive for CK 7, MOC 31, CK  5/6; Neg for Er/PR, GATA-3, GCDFP CDX2, Napsin A and TTF-1  CT CAP: 07/12/2016: No evidence of metastatic disease in chest abdomen or pelvis scattered nonspecific hypodensities within the liver measuring 1 cm stable from 2006  Recommendations: 1. Radiation therapy to the brain 2. followed by surveillance and observation I discussed the patient that the lesion in the cerebellum was poorly differentiated and does not have any specific characteristics to pinpoint the likely primary. However based upon her prior history of triple negative breast cancer, I would attribute the lesion the brain to be metastatic breast cancer.  Since that is no evidence of systemic disease, I'm not recommending systemic chemotherapy at this time. She will need to be followed and watched closely for development of additional lesions anywhere else. I plan to see her back in 3 months with a CT of  chest abdomen and pelvis and MRI of the brain.  Return to clinic after June 15 to discuss the results of the scans. She is planning to go on a trip to [place] with her fianc. I spent 25 minutes talking to the patient of which more than half was spent in counseling and coordination of care.  No orders of the defined types were placed in this encounter.  The patient has a good understanding of the overall plan. she agrees with it. she will call with any problems that may develop before the next visit here.   Rulon Eisenmenger, MD 08/18/16

## 2016-08-18 NOTE — Assessment & Plan Note (Signed)
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 Neoadj chemo 12/24/14- 04/29/15 AC x 4 foll by Abraxane X 12 Rt Lumpectomy: Path CR 0/2 LN Adj XRT 07/23/15- 09/08/15  Cerebellar mass diagnosed 07/12/2016: Right suboccipital craniotomy for tumor resection with stereotactic navigation 07/29/16: Metastatic poorly differentiated adenocarcinoma with extensive necrosis positive for CK 7, MOC 31, CK 5/6; Neg for Er/PR, GATA-3, GCDFP CDX2, Napsin A and TTF-1  CT CAP: 07/12/2016: No evidence of metastatic disease in chest abdomen or pelvis scattered nonspecific hypodensities within the liver measuring 1 cm stable from 2006  Recommendations: 1. Radiation therapy to the brain 2. followed by surveillance and observation I discussed the patient that the lesion in the cerebellum was poorly differentiated and does not have any specific characteristics to pinpoint the likely primary. However based upon her prior history of triple negative breast cancer, I would attribute the lesion the brain to be metastatic breast cancer.  Since that is no evidence of systemic disease, I'm not recommending systemic chemotherapy at this time. She will need to be followed and watched closely for development of additional lesions anywhere else. I plan to see her back in 3 months with a CT of 4 chest abdomen and pelvis and MRI of the brain.

## 2016-08-19 ENCOUNTER — Encounter: Payer: Self-pay | Admitting: *Deleted

## 2016-08-25 NOTE — Progress Notes (Signed)
Kaitlyn Keith. 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  Headache: sporadic; reports taking Percocet for management Pain: intermittent occasional sharp pain at incision site. Reports numbness in hands and feet Dizziness: intermittent Nausea/Vomiting/Ataxia: No Visional changes(Blurred/Diplopia (double vision), blind spots and peripheral vision changes): blurry Ring in ears: intermittent tinnitus with muffled hearing Fatigue: poor energy Cognitive changes: poor recall, difficulty word finding, slurred speech                                                Appetite: poor Respiratory complaints: cough resolved denies SOB Imaging:07-28-16 MRI brain w wo contrast Lab:N/A 08-18-16 Saw Dr. Lindi Adie  Return to clinic after June 15 to discuss the results of the scans  Reports she feel cold all the time (since surgery). Denies any bowel complaints. Seen by neurologist last week. Scheduled to follow up with neurology again on 4/28.

## 2016-08-30 ENCOUNTER — Telehealth: Payer: Self-pay | Admitting: Radiation Oncology

## 2016-08-30 ENCOUNTER — Ambulatory Visit
Admission: RE | Admit: 2016-08-30 | Discharge: 2016-08-30 | Disposition: A | Discharge status: Home / Self Care | Payer: BLUE CROSS/BLUE SHIELD | Source: Ambulatory Visit | Attending: Radiation Oncology | Admitting: Radiation Oncology

## 2016-08-30 VITALS — BP 97/63 | HR 73 | Temp 98.2°F | Resp 18 | Wt 181.6 lb

## 2016-08-30 DIAGNOSIS — R6889 Other general symptoms and signs: Secondary | ICD-10-CM

## 2016-08-30 DIAGNOSIS — Z853 Personal history of malignant neoplasm of breast: Secondary | ICD-10-CM | POA: Diagnosis not present

## 2016-08-30 DIAGNOSIS — R51 Headache: Secondary | ICD-10-CM | POA: Insufficient documentation

## 2016-08-30 DIAGNOSIS — Z79899 Other long term (current) drug therapy: Secondary | ICD-10-CM | POA: Diagnosis not present

## 2016-08-30 DIAGNOSIS — G893 Neoplasm related pain (acute) (chronic): Secondary | ICD-10-CM

## 2016-08-30 DIAGNOSIS — C7931 Secondary malignant neoplasm of brain: Secondary | ICD-10-CM

## 2016-08-30 DIAGNOSIS — F411 Generalized anxiety disorder: Secondary | ICD-10-CM

## 2016-08-30 DIAGNOSIS — R42 Dizziness and giddiness: Secondary | ICD-10-CM | POA: Diagnosis not present

## 2016-08-30 DIAGNOSIS — R4781 Slurred speech: Secondary | ICD-10-CM | POA: Insufficient documentation

## 2016-08-30 DIAGNOSIS — Z923 Personal history of irradiation: Secondary | ICD-10-CM | POA: Diagnosis not present

## 2016-08-30 LAB — TSH: TSH: 0.959 m[IU]/L (ref 0.308–3.960)

## 2016-08-30 NOTE — Progress Notes (Signed)
Please call patient with normal result.  Thanks. MM 

## 2016-08-30 NOTE — Addendum Note (Signed)
Encounter addended by: Tyler Pita, MD on: 08/30/2016  9:42 AM<BR>    Actions taken: Problem List reviewed, Visit diagnoses modified, Order list changed, Diagnosis association updated, Sign clinical note

## 2016-08-30 NOTE — Addendum Note (Signed)
Encounter addended by: Heywood Footman, RN on: 08/30/2016  2:17 PM<BR>    Actions taken: Charge Capture section accepted

## 2016-08-30 NOTE — Progress Notes (Addendum)
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     s/p Curative pre-op SRS on 07/26/16 where the PTV1 Right Cerebellum was treated to 18 Gy in 1 fraction    ICD-9-CM ICD-10-CM   1. 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  SHe has had pain at the surgical site.  Headache: sporadic; reports taking Percocet for management Pain: intermittent occasional sharp pain at incision site. Reports numbness in hands and feet Dizziness: intermittent Nausea/Vomiting/Ataxia: No Visional changes(Blurred/Diplopia (double vision), blind spots and peripheral vision changes): blurry Ring in ears: intermittent tinnitus with muffled hearing Fatigue: poor energy Cognitive changes: poor recall, difficulty word finding, slurred speech                                                Appetite: poor Respiratory complaints: cough resolved denies SOB Imaging:07-28-16 MRI brain w wo contrast Lab:N/A 08-18-16 Saw Dr. Lindi Adie  Return to clinic after June 15 to discuss the results of the scans  Reports she feel cold all the time (since surgery). Denies any bowel complaints. Seen by neurologist last week. Scheduled to follow up with neurology again on 4/28.                               ALLERGIES:  has No Known Allergies.  Meds: Current Outpatient Prescriptions  Medication Sig Dispense Refill  . ALPRAZolam (XANAX) 1 MG tablet Take 2 tablets (2 mg total) by mouth at bedtime. (Patient taking differently: Take 1 mg by mouth 2 (two) times daily as needed for anxiety or  sleep. ) 60 tablet 0  . cyclobenzaprine (FLEXERIL) 10 MG tablet     . DULoxetine (CYMBALTA) 20 MG capsule Take 2 capsules (40 mg total) by mouth daily. 30 capsule 3  . lisinopril-hydrochlorothiazide (PRINZIDE,ZESTORETIC) 20-12.5 MG tablet Take 1 tablet by mouth 2 (two) times daily. 60 tablet 6  . meloxicam (MOBIC) 15 MG tablet     . oxyCODONE-acetaminophen (PERCOCET) 7.5-325 MG tablet Take 1-2 tablets by mouth every 4 hours as needed for pain. 60 tablet 0  . Vitamin D, Ergocalciferol, (DRISDOL) 50000 UNITS CAPS capsule Take 1 capsule (50,000 Units total) by mouth every 7 (seven) days. (Patient taking differently: Take 50,000 Units by mouth every 7 (seven) days. mondays) 12 capsule 3  . gabapentin (NEURONTIN) 400 MG capsule Take 400 mg (1tab) PO TID (Patient not taking: Reported on 07/19/2016) 100 capsule 1  . LORazepam (ATIVAN) 1 MG tablet TAKE ONE TABLET BY MOUTH EVERY 6 HOURS AS NEEDED FOR NAUSEA AND VOMITING (Patient not taking: Reported on 07/16/2016) 30 tablet 0   No current facility-administered medications for this encounter.     Physical Findings: The patient is in no acute distress. Patient is alert and oriented.  weight is 181 lb 9.6 oz (82.4 kg). Her oral temperature is 98.2  F (36.8 C). Her blood pressure is 97/63 and her pulse is 73. Her respiration is 18 and oxygen saturation is 100%. .  Neuro intact.  Surgical site notable for some mild swelling and tenderness.  No significant changes.  Lab Findings: Lab Results  Component Value Date   WBC 15.0 (H) 07/28/2016   HGB 13.1 07/28/2016   HGB 13.1 06/23/2015   HCT 39.7 07/28/2016   HCT 40.1 06/23/2015   PLT 182 07/28/2016   PLT 189 06/23/2015    Lab Results  Component Value Date   NA 133 (L) 07/28/2016   NA 139 06/23/2015   K 3.5 07/28/2016   K 3.9 06/23/2015   CHLORIDE 106 06/23/2015   CO2 24 07/28/2016   CO2 24 06/23/2015   GLUCOSE 131 (H) 07/28/2016   GLUCOSE 86 06/23/2015   BUN 13 07/28/2016   BUN 14.9 06/23/2015     CREATININE 0.61 07/28/2016   CREATININE 0.8 06/23/2015   BILITOT 0.41 06/23/2015   ALKPHOS 91 06/23/2015   AST 13 06/23/2015   ALT 13 06/23/2015   PROT 7.0 06/23/2015   ALBUMIN 3.4 (L) 06/23/2015   CALCIUM 8.3 (L) 07/28/2016   CALCIUM 9.0 06/23/2015   ANIONGAP 8 07/28/2016    Radiographic Findings: No results found.  Impression:  The patient is recovering from the effects of radiation.    Plan:  Re-stage brain MRI in June and then follow-up.  Will check TSH today due to constellation of symptoms (feeling cold, pain, numbness, anxiety).  _____________________________________  Sheral Apley Tammi Klippel, M.D.

## 2016-08-30 NOTE — Telephone Encounter (Signed)
-----   Message from Doreen Beam, RN sent at 08/30/2016  1:19 PM EDT -----   ----- Message ----- From: Tyler Pita, MD Sent: 08/30/2016   1:17 PM To: [facility] Radiation Nurse  Please call patient with normal result.  Thanks. MM

## 2016-08-30 NOTE — Telephone Encounter (Signed)
Per Dr. Johny Shears order phoned patient making her aware that her TSH level was normal. Patient verbalized understanding and expressed appreciation for the call.

## 2016-08-31 ENCOUNTER — Other Ambulatory Visit: Payer: Self-pay | Admitting: Radiation Therapy

## 2016-08-31 DIAGNOSIS — C7931 Secondary malignant neoplasm of brain: Secondary | ICD-10-CM

## 2016-08-31 DIAGNOSIS — C7949 Secondary malignant neoplasm of other parts of nervous system: Principal | ICD-10-CM

## 2016-09-03 ENCOUNTER — Other Ambulatory Visit: Payer: Self-pay | Admitting: *Deleted

## 2016-09-03 DIAGNOSIS — Z171 Estrogen receptor negative status [ER-]: Principal | ICD-10-CM

## 2016-09-03 DIAGNOSIS — C50511 Malignant neoplasm of lower-outer quadrant of right female breast: Secondary | ICD-10-CM

## 2016-09-06 ENCOUNTER — Other Ambulatory Visit: Payer: BLUE CROSS/BLUE SHIELD

## 2016-09-06 ENCOUNTER — Encounter: Payer: BLUE CROSS/BLUE SHIELD | Admitting: Genetic Counselor

## 2016-09-21 ENCOUNTER — Telehealth: Payer: Self-pay | Admitting: *Deleted

## 2016-09-21 NOTE — Telephone Encounter (Signed)
CALLED PATIENT TO ASK ABOUT RESCHEDULING FU ON 10-25-16 DUE TO ALISON PERKINS BEING OFF, RESCHEDULED FOR 11-01-16 @ 8 AM, PT. AGREED TO NEW TIME AND DATE

## 2016-10-22 ENCOUNTER — Inpatient Hospital Stay: Admission: RE | Admit: 2016-10-22 | Payer: BLUE CROSS/BLUE SHIELD | Source: Ambulatory Visit

## 2016-10-25 ENCOUNTER — Telehealth: Payer: Self-pay | Admitting: Radiation Therapy

## 2016-10-25 ENCOUNTER — Ambulatory Visit: Payer: Self-pay | Admitting: Radiation Oncology

## 2016-10-25 ENCOUNTER — Ambulatory Visit
Admission: RE | Admit: 2016-10-25 | Discharge: 2016-10-25 | Disposition: A | Discharge status: Home / Self Care | Payer: BLUE CROSS/BLUE SHIELD | Source: Ambulatory Visit | Attending: Radiation Oncology | Admitting: Radiation Oncology

## 2016-10-25 NOTE — Telephone Encounter (Signed)
I spoke with Ms. Alston's sister, Leane Call, to share her appointment information since I am unable to leave messages on Ms. Alston's phone. [name] is aware that her sister missed the MRI on 6/1 and that we are rescheduling that and the 6/4 follow-up to a later date this month. She has written them down and plans to share this with her sister.   MRI 6/26- Arrive at 11:30 for brain MRI at [facility]. Address is [address]. Follow-up 6/28 - To see [name] to review this scan.  Mont Dutton R.T.(R)(T) Special Procedures Navigator

## 2016-11-01 ENCOUNTER — Ambulatory Visit: Payer: Self-pay | Admitting: Radiation Oncology

## 2016-11-01 ENCOUNTER — Other Ambulatory Visit: Payer: Self-pay

## 2016-11-01 DIAGNOSIS — C50511 Malignant neoplasm of lower-outer quadrant of right female breast: Secondary | ICD-10-CM

## 2016-11-01 DIAGNOSIS — Z171 Estrogen receptor negative status [ER-]: Principal | ICD-10-CM

## 2016-11-03 ENCOUNTER — Telehealth: Payer: Self-pay | Admitting: Hematology and Oncology

## 2016-11-03 NOTE — Telephone Encounter (Signed)
Not able to reach patient re new appointment for 6/28 - moved from 6/15 due to PAL. Patient my chart active.

## 2016-11-04 ENCOUNTER — Telehealth: Payer: Self-pay | Admitting: Hematology and Oncology

## 2016-11-04 NOTE — Telephone Encounter (Signed)
sw pt to confirm r/s appt 6/15 to 6/28 at noon per VG on PAL

## 2016-11-05 ENCOUNTER — Ambulatory Visit: Payer: BLUE CROSS/BLUE SHIELD | Admitting: Hematology and Oncology

## 2016-11-05 ENCOUNTER — Ambulatory Visit (HOSPITAL_COMMUNITY)
Admission: RE | Admit: 2016-11-05 | Discharge: 2016-11-05 | Disposition: A | Discharge status: Home / Self Care | Payer: BLUE CROSS/BLUE SHIELD | Source: Ambulatory Visit | Attending: Hematology and Oncology | Admitting: Hematology and Oncology

## 2016-11-05 DIAGNOSIS — I700 Atherosclerosis of aorta: Secondary | ICD-10-CM | POA: Insufficient documentation

## 2016-11-05 DIAGNOSIS — C50511 Malignant neoplasm of lower-outer quadrant of right female breast: Secondary | ICD-10-CM

## 2016-11-05 DIAGNOSIS — R918 Other nonspecific abnormal finding of lung field: Secondary | ICD-10-CM | POA: Diagnosis not present

## 2016-11-05 DIAGNOSIS — Z170 Estrogen receptor positive status [ER+]: Secondary | ICD-10-CM | POA: Diagnosis not present

## 2016-11-05 DIAGNOSIS — Z171 Estrogen receptor negative status [ER-]: Secondary | ICD-10-CM

## 2016-11-05 MED ORDER — IOPAMIDOL (ISOVUE-300) INJECTION 61%
100.0000 mL | Freq: Once | INTRAVENOUS | Status: AC | PRN
  Administered 2016-11-05: 100 mL via INTRAVENOUS

## 2016-11-05 MED ORDER — IOPAMIDOL (ISOVUE-300) INJECTION 61%
INTRAVENOUS | Status: AC
  Filled 2016-11-05: qty 100

## 2016-11-15 ENCOUNTER — Other Ambulatory Visit: Payer: Self-pay | Admitting: *Deleted

## 2016-11-15 ENCOUNTER — Encounter: Payer: Self-pay | Admitting: *Deleted

## 2016-11-15 ENCOUNTER — Telehealth: Payer: Self-pay | Admitting: Adult Health

## 2016-11-15 DIAGNOSIS — C50511 Malignant neoplasm of lower-outer quadrant of right female breast: Secondary | ICD-10-CM

## 2016-11-15 DIAGNOSIS — Z171 Estrogen receptor negative status [ER-]: Principal | ICD-10-CM

## 2016-11-15 NOTE — Progress Notes (Signed)
Scheduled appt for PET on 6/27 at 10:30. Gave pt detailed information.

## 2016-11-15 NOTE — Telephone Encounter (Signed)
Called [facility] for Peer to peer review regarding PET scan and spoke with Dr. Lavera Guise.  Received authorization for PET scan on patient # [id number] valid today through 01/13/17 at [place], NP

## 2016-11-16 ENCOUNTER — Ambulatory Visit
Admission: RE | Admit: 2016-11-16 | Discharge: 2016-11-16 | Disposition: A | Discharge status: Home / Self Care | Payer: BLUE CROSS/BLUE SHIELD | Source: Ambulatory Visit | Attending: Radiation Oncology | Admitting: Radiation Oncology

## 2016-11-16 DIAGNOSIS — C7931 Secondary malignant neoplasm of brain: Secondary | ICD-10-CM

## 2016-11-16 DIAGNOSIS — C7949 Secondary malignant neoplasm of other parts of nervous system: Principal | ICD-10-CM

## 2016-11-16 MED ORDER — GADOBENATE DIMEGLUMINE 529 MG/ML IV SOLN
18.0000 mL | Freq: Once | INTRAVENOUS | Status: AC | PRN
  Administered 2016-11-16: 18 mL via INTRAVENOUS

## 2016-11-17 ENCOUNTER — Encounter (HOSPITAL_COMMUNITY): Payer: Self-pay | Admitting: Radiology

## 2016-11-17 ENCOUNTER — Ambulatory Visit (HOSPITAL_COMMUNITY)
Admission: RE | Admit: 2016-11-17 | Discharge: 2016-11-17 | Disposition: A | Discharge status: Home / Self Care | Payer: BLUE CROSS/BLUE SHIELD | Source: Ambulatory Visit | Attending: Hematology and Oncology | Admitting: Hematology and Oncology

## 2016-11-17 DIAGNOSIS — C50511 Malignant neoplasm of lower-outer quadrant of right female breast: Secondary | ICD-10-CM | POA: Diagnosis not present

## 2016-11-17 DIAGNOSIS — Z171 Estrogen receptor negative status [ER-]: Secondary | ICD-10-CM | POA: Diagnosis not present

## 2016-11-17 DIAGNOSIS — M898X8 Other specified disorders of bone, other site: Secondary | ICD-10-CM | POA: Insufficient documentation

## 2016-11-17 DIAGNOSIS — K769 Liver disease, unspecified: Secondary | ICD-10-CM | POA: Insufficient documentation

## 2016-11-17 DIAGNOSIS — R590 Localized enlarged lymph nodes: Secondary | ICD-10-CM | POA: Diagnosis not present

## 2016-11-17 LAB — GLUCOSE, CAPILLARY: Glucose-Capillary: 80 mg/dL (ref 65–99)

## 2016-11-17 MED ORDER — FLUDEOXYGLUCOSE F - 18 (FDG) INJECTION
9.0200 | Freq: Once | INTRAVENOUS | Status: AC | PRN
  Administered 2016-11-17: 9.02 via INTRAVENOUS

## 2016-11-18 ENCOUNTER — Encounter: Payer: Self-pay | Admitting: Hematology and Oncology

## 2016-11-18 ENCOUNTER — Encounter: Payer: Self-pay | Admitting: *Deleted

## 2016-11-18 ENCOUNTER — Other Ambulatory Visit (HOSPITAL_BASED_OUTPATIENT_CLINIC_OR_DEPARTMENT_OTHER): Payer: BLUE CROSS/BLUE SHIELD

## 2016-11-18 ENCOUNTER — Ambulatory Visit (HOSPITAL_BASED_OUTPATIENT_CLINIC_OR_DEPARTMENT_OTHER): Payer: BLUE CROSS/BLUE SHIELD | Admitting: Hematology and Oncology

## 2016-11-18 ENCOUNTER — Ambulatory Visit
Admission: RE | Admit: 2016-11-18 | Discharge: 2016-11-18 | Disposition: A | Discharge status: Home / Self Care | Payer: BLUE CROSS/BLUE SHIELD | Source: Ambulatory Visit | Attending: Urology | Admitting: Urology

## 2016-11-18 ENCOUNTER — Ambulatory Visit: Payer: BLUE CROSS/BLUE SHIELD | Admitting: Genetics

## 2016-11-18 VITALS — BP 153/98 | HR 63 | Temp 97.7°F | Resp 19 | Ht 63.0 in | Wt 182.0 lb

## 2016-11-18 DIAGNOSIS — Z171 Estrogen receptor negative status [ER-]: Secondary | ICD-10-CM

## 2016-11-18 DIAGNOSIS — C7931 Secondary malignant neoplasm of brain: Secondary | ICD-10-CM

## 2016-11-18 DIAGNOSIS — C50919 Malignant neoplasm of unspecified site of unspecified female breast: Secondary | ICD-10-CM

## 2016-11-18 DIAGNOSIS — C50511 Malignant neoplasm of lower-outer quadrant of right female breast: Secondary | ICD-10-CM

## 2016-11-18 LAB — COMPREHENSIVE METABOLIC PANEL
ALT: 11 U/L (ref 0–55)
AST: 15 U/L (ref 5–34)
Albumin: 3.7 g/dL (ref 3.5–5.0)
Alkaline Phosphatase: 99 U/L (ref 40–150)
Anion Gap: 9 mEq/L (ref 3–11)
BILIRUBIN TOTAL: 0.44 mg/dL (ref 0.20–1.20)
BUN: 7.8 mg/dL (ref 7.0–26.0)
CO2: 28 meq/L (ref 22–29)
Calcium: 9.4 mg/dL (ref 8.4–10.4)
Chloride: 105 mEq/L (ref 98–109)
Creatinine: 0.8 mg/dL (ref 0.6–1.1)
Glucose: 88 mg/dl (ref 70–140)
Potassium: 3.4 mEq/L — ABNORMAL LOW (ref 3.5–5.1)
SODIUM: 143 meq/L (ref 136–145)
TOTAL PROTEIN: 7.1 g/dL (ref 6.4–8.3)

## 2016-11-18 LAB — CBC WITH DIFFERENTIAL/PLATELET
BASO%: 0.2 % (ref 0.0–2.0)
Basophils Absolute: 0 10*3/uL (ref 0.0–0.1)
EOS%: 0.8 % (ref 0.0–7.0)
Eosinophils Absolute: 0 10*3/uL (ref 0.0–0.5)
HCT: 43.7 % (ref 34.8–46.6)
HGB: 14.3 g/dL (ref 11.6–15.9)
LYMPH%: 21.9 % (ref 14.0–49.7)
MCH: 30 pg (ref 25.1–34.0)
MCHC: 32.7 g/dL (ref 31.5–36.0)
MCV: 91.8 fL (ref 79.5–101.0)
MONO#: 0.4 10*3/uL (ref 0.1–0.9)
MONO%: 7.6 % (ref 0.0–14.0)
NEUT%: 69.5 % (ref 38.4–76.8)
NEUTROS ABS: 3.4 10*3/uL (ref 1.5–6.5)
Platelets: 203 10*3/uL (ref 145–400)
RBC: 4.76 10*6/uL (ref 3.70–5.45)
RDW: 12.4 % (ref 11.2–14.5)
WBC: 4.9 10*3/uL (ref 3.9–10.3)
lymph#: 1.1 10*3/uL (ref 0.9–3.3)

## 2016-11-18 NOTE — Assessment & Plan Note (Signed)
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 Neoadj chemo 12/24/14- 04/29/15 AC x 4 foll by Abraxane X 12 Rt Lumpectomy: Path CR 0/2 LN Adj XRT 07/23/15- 09/08/15  Cerebellar mass diagnosed 07/12/2016: Right suboccipital craniotomy for tumor resection with stereotactic navigation 07/29/16: Metastatic poorly differentiated adenocarcinoma with extensive necrosis positive for CK 7, MOC 31, CK 5/6; Neg for Er/PR, GATA-3, GCDFP CDX2, Napsin A and TTF-1  CT CAP: 07/12/2016: No evidence of metastatic disease in chest abdomen or pelvis scattered nonspecific hypodensities within the liver measuring 1 cm stable from 2006  PET/CT scan 11/17/2016:Subcutaneous nodules in the neck, upper back, left arm, abdomen and pelvis,. Toenail and pelvic nodules consistent with metastatic disease, normal size nodules in the left axilla and left retropectoral region  Recommendations: 1. Biopsy of one of the subcutaneous nodules 2. will plan to send it for Foundation 1 testing 3. Patient will be sent to genetic counseling for BRCA mutation testing  I also discussed with her about going to [facility] for clinical trial for triple negative disease. I will see her back in 3 weeks to discuss the final treatment plan.

## 2016-11-18 NOTE — Progress Notes (Signed)
Patient Care Team: Gwendel Hanson as PCP - General (Cardiology) [name], [name], [name], [name], [name], [name], NP as Nurse Practitioner (Nurse Practitioner)  DIAGNOSIS:  Encounter Diagnosis  Name Primary?  . 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, estrogen receptor negative ([place]) Yes    SUMMARY OF ONCOLOGIC HISTORY:   Breast cancer of lower-outer quadrant of right female breast ([place])   12/03/2014 Mammogram    Right breast mass 1.9 cm it o'clock position 8 cm depth from the nipple      12/03/2014 Initial Diagnosis    Right breast biopsy 8:00: Invasive ductal carcinoma, grade 3, ER 0%, PR 0%, Ki-67 90%, HER-2 negative ratio 1.43      12/10/2014 Breast MRI    Right breast lower outer quadrant: 2.3 x 2.4 x 2.4 cm rim-enhancing mass abuts the pectoralis fascia but no enhancement of pectoralis muscle, second focus of artifact?'s second tissue marker clip, no lymph nodes      12/10/2014 Clinical Stage    Stage IIA: T2 N0      12/24/2014 - 04/29/2015 Neo-Adjuvant Chemotherapy    Dose dense Adriamycin and Cytoxan 4 followed by weekly Abraxane 12      05/02/2015 Breast MRI    complete radiologic response      06/16/2015 Surgery    Left Lumpectomy: Complete path Response, 0/2 LN      06/16/2015 Pathologic Stage    ypT0 ypN0      07/23/2015 - 09/05/2015 Radiation Therapy    Adjuvant RT: 50.4 Gy in 28 fractions and a boost of 10 Gy in 5 fractions to total dose of 60.4 Gy      10/24/2015 Survivorship    SCP mailed to patient in lieu of in person visit.      07/26/2016 - 07/27/2016 Radiation Therapy     SRS brain      07/27/2016 - 07/29/2016 Hospital Admission    Cerebellar mass: Right suboccipital  craniotomy for tumor resection with stereotactic navigation: Metastatic poorly differentiated adenocarcinoma with extensive necrosis positive for CK 7, MOC 31, CK 5/6; Neg for Er/PR, GATA-3, GCDFP CDX2, Napsin A and TTF-1       PET scan         11/17/2016 PET scan    Subcutaneous nodules in the neck, upper back, left arm, abdomen and pelvis,. 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 She underwent a PET/CT scan is here today to discuss the results. The PET/CT showed multiple subcutaneous nodules as well as peritoneal lesions that are concerning for metastatic disease. She is here today accompanied by her sister.  REVIEW OF SYSTEMS:   Constitutional: Denies fevers, chills or abnormal weight loss Eyes: Denies blurriness of vision Ears, nose, mouth, throat, and face: Denies mucositis or sore throat Respiratory: Denies cough, dyspnea or wheezes Cardiovascular: Denies palpitation, chest discomfort Gastrointestinal:  Denies nausea, heartburn or change in bowel habits Skin: Denies abnormal skin rashes Lymphatics: Denies new lymphadenopathy or easy  bruising Neurological:Denies numbness, tingling or new weaknesses Behavioral/Psych: Mood is stable, no new changes  Extremities: No lower extremity edema All other systems were reviewed with the patient and are negative.  I have reviewed the past medical history, past surgical history, social history and family history with the patient and they are unchanged from previous note.  ALLERGIES:  has No Known Allergies.  MEDICATIONS:  Current Outpatient Prescriptions  Medication Sig Dispense Refill  . ALPRAZolam  (XANAX) 1 MG tablet Take 2 tablets (2 mg total) by mouth at bedtime. (Patient taking differently: Take 1 mg by mouth 2 (two) times daily as needed for anxiety or sleep. ) 60 tablet 0  . cyclobenzaprine (FLEXERIL) 10 MG tablet     . DULoxetine (CYMBALTA) 20 MG capsule Take 2 capsules (40 mg total) by mouth daily. 30 capsule 3  . gabapentin (NEURONTIN) 400 MG capsule Take 400 mg (1tab) PO TID (Patient not taking: Reported on 07/19/2016) 100 capsule 1  . lisinopril-hydrochlorothiazide (PRINZIDE,ZESTORETIC) 20-12.5 MG tablet Take 1 tablet by mouth 2 (two) times daily. 60 tablet 6  . LORazepam (ATIVAN) 1 MG tablet TAKE ONE TABLET BY MOUTH EVERY 6 HOURS AS NEEDED FOR NAUSEA AND VOMITING (Patient not taking: Reported on 07/16/2016) 30 tablet 0  . meloxicam (MOBIC) 15 MG tablet     . oxyCODONE-acetaminophen (PERCOCET) 7.5-325 MG tablet Take 1-2 tablets by mouth every 4 hours as needed for pain. 60 tablet 0  . Vitamin D, Ergocalciferol, (DRISDOL) 50000 UNITS CAPS capsule Take 1 capsule (50,000 Units total) by mouth every 7 (seven) days. (Patient taking differently: Take 50,000 Units by mouth every 7 (seven) days. mondays) 12 capsule 3   No current facility-administered medications for this visit.     PHYSICAL EXAMINATION: ECOG PERFORMANCE STATUS: 1 - Symptomatic but completely ambulatory  Vitals:   11/18/16 1234  BP: (!) 153/98  Pulse: 63  Resp: 19  Temp: 97.7 F (36.5 C)   Filed Weights   11/18/16 1234  Weight: 182 lb (82.6 kg)    GENERAL:alert, no distress and comfortable SKIN: skin color, texture, turgor are normal, no rashes or significant lesions EYES: normal, Conjunctiva are pink and non-injected, sclera clear OROPHARYNX:no exudate, no erythema and lips, buccal mucosa, and tongue normal  NECK: supple, thyroid normal size, non-tender, without nodularity LYMPH:  no palpable lymphadenopathy in the cervical, axillary or inguinal LUNGS: clear to auscultation and percussion with normal  breathing effort HEART: regular rate & rhythm and no murmurs and no lower extremity edema ABDOMEN:abdomen soft, non-tender and normal bowel sounds MUSCULOSKELETAL:no cyanosis of digits and no clubbing  NEURO: alert & oriented x 3 with fluent speech, no focal motor/sensory deficits EXTREMITIES: No lower extremity edema  LABORATORY DATA:  I have reviewed the data as listed   Chemistry      Component Value Date/Time   NA 143 11/18/2016 1539   K 3.4 (L) 11/18/2016 1539   CL 101 07/28/2016 0700   CO2 28 11/18/2016 1539   BUN 7.8 11/18/2016 1539   CREATININE 0.8 11/18/2016 1539      Component Value Date/Time   CALCIUM 9.4 11/18/2016 1539   ALKPHOS 99 11/18/2016 1539   AST 15 11/18/2016 1539   ALT 11 11/18/2016 1539   BILITOT 0.44 11/18/2016 1539       Lab Results  Component Value Date   WBC 4.9 11/18/2016   HGB 14.3 11/18/2016   HCT 43.7 11/18/2016   MCV 91.8 11/18/2016   PLT 203 11/18/2016   NEUTROABS 3.4  11/18/2016    ASSESSMENT & PLAN:  Breast cancer of lower-outer quadrant of right female breast ([place]) Right breast biopsy 12/03/2014 8:00: Invasive ductal carcinoma, grade 3, ER 0%, PR 0%, 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 Neoadj chemo 12/24/14- 04/29/15 AC x 4 foll by Abraxane X 12 Rt Lumpectomy: Path CR 0/2 LN Adj XRT 07/23/15- 09/08/15  Cerebellar mass diagnosed 07/12/2016: Right suboccipital craniotomy for tumor resection with stereotactic navigation 07/29/16: Metastatic poorly differentiated adenocarcinoma with extensive necrosis positive for CK 7, MOC 31, CK 5/6; Neg for Er/PR, GATA-3, GCDFP CDX2, Napsin A and TTF-1  CT CAP: 07/12/2016: No evidence of metastatic disease in chest abdomen or pelvis scattered nonspecific hypodensities within the liver measuring 1 cm stable from 2006  PET/CT scan 11/17/2016:Subcutaneous nodules in the neck, upper back,  left arm, abdomen and pelvis,. Toenail and pelvic nodules consistent with metastatic disease, normal size nodules in the left axilla and left retropectoral region  Recommendations: 1. Biopsy of one of the subcutaneous nodules 2. will plan to send it for Foundation 1 testing 3. Patient will be sent to genetic counseling for BRCA mutation testing  I also discussed with her about going to [facility] for clinical trial for triple negative disease. I will see her back in 3 weeks to discuss the final treatment plan.    I spent 25 minutes talking to the patient of which more than half was spent in counseling and coordination of care.  Orders Placed This Encounter  Procedures  . US Biopsy    Standing Status:   Future    Standing Expiration Date:   01/18/2018    Order Specific Question:   Lab orders requested (DO NOT place separate lab orders, these will be automatically ordered during procedure specimen collection):    Answer:   Cytology - Non Pap    Order Specific Question:   Lab orders requested (DO NOT place separate lab orders, these will be automatically ordered during procedure specimen collection):    Answer:   Surgical Pathology    Order Specific Question:   Reason for Exam (SYMPTOM  OR DIAGNOSIS REQUIRED)    Answer:   Ultrasound-guided biopsy of right inguinal lymph node with history of triple negative breast cancer    Comments:   discussed with Dr. Earleen Newport    Order Specific Question:   Preferred imaging location?    Answer:   [facility]   The patient has a good understanding of the overall plan. she agrees with it. she will call with any problems that may develop before the next visit here.   Rulon Eisenmenger, MD 11/18/16

## 2016-11-19 ENCOUNTER — Encounter: Payer: Self-pay | Admitting: Genetics

## 2016-11-19 NOTE — Progress Notes (Addendum)
REFERRING PROVIDER: [name], MD [place], [place] [address]  PRIMARY PROVIDER:  Secundino Ginger, PA-C  PRIMARY REASON FOR VISIT:  1. Malignant neoplasm of breast in female, estrogen receptor negative, unspecified laterality, unspecified site of breast ([place])      HISTORY OF PRESENT ILLNESS:   [name], a 51 y.o. female, was seen for a [place] cancer genetics consultation at the request of Dr. Lindi Adie due to a personal history of breast cancer and metastatic disease.  [name] presents to clinic today to discuss the possibility of a BRCA1/BRCA2 mutation and potential use of a PARP inhibitor, hereditary predisposition to cancer, genetic testing, and to further clarify her future cancer risks, as well as potential cancer risks for family members. [name] was accompanied to her appointment by her sister, Kaitlyn Keith. During the appointment, [name] gave verbal permission to leave a message with Kaitlyn Keith if we cannot reach her with results.  In July 2016, at the age of 83, [name] was diagnosed with triple negative invasive ductal carcinoma of the right breast. This was treated with neoadjuvant chemotherapy, lumpectomy, and radiation. In March 2018, she was found to have a brain metastasis which was treated with surgery and radiation. On November 17, 2016, PET results showed avid subcutaneous nodules in the neck, upper back, left arm, abdomen, and pelvis consistent with metastatic disease. Peritoneal and pelvic nodules were also most consistent with metastatic disease.  CANCER HISTORY:    Breast cancer of lower-outer quadrant of right female breast ([place])   12/03/2014 Mammogram    Right breast mass 1.9 cm it o'clock position 8 cm depth from the nipple      12/03/2014 Initial Diagnosis    Right breast biopsy 8:00: Invasive ductal carcinoma, grade 3, ER 0%, PR 0%, Ki-67 90%, HER-2 negative ratio 1.43      12/10/2014 Breast MRI    Right breast lower outer  quadrant: 2.3 x 2.4 x 2.4 cm rim-enhancing mass abuts the pectoralis fascia but no enhancement of pectoralis muscle, second focus of artifact?'s second tissue marker clip, no lymph nodes      12/10/2014 Clinical Stage    Stage IIA: T2 N0      12/24/2014 - 04/29/2015 Neo-Adjuvant Chemotherapy    Dose dense Adriamycin and Cytoxan 4 followed by weekly Abraxane 12      05/02/2015 Breast MRI    complete radiologic response      06/16/2015 Surgery    Left Lumpectomy: Complete path Response, 0/2 LN      06/16/2015 Pathologic Stage    ypT0 ypN0      07/23/2015 - 09/05/2015 Radiation Therapy    Adjuvant RT: 50.4 Gy in 28 fractions and a boost of 10 Gy in 5 fractions to total dose of 60.4 Gy      10/24/2015 Survivorship    SCP mailed to patient in lieu of in person visit.      07/26/2016 - 07/27/2016 Radiation Therapy     SRS brain      07/27/2016 - 07/29/2016 Hospital Admission    Cerebellar mass: Right suboccipital craniotomy for tumor resection with stereotactic navigation: Metastatic poorly differentiated adenocarcinoma with extensive necrosis positive for CK 7, MOC 31, CK 5/6; Neg for Er/PR, GATA-3, GCDFP CDX2, Napsin A and TTF-1       PET scan         11/17/2016 PET scan    Subcutaneous nodules in the neck, upper back, left arm, abdomen and pelvis,. Toenail and  pelvic nodules consistent with metastatic disease, normal size nodules in the left axilla and left retropectoral region       Past Medical History:  Diagnosis Date  . Anxiety   . Arthritis   . Back pain   . Brain cancer ([place])   . Breast cancer ([place])   . Breast cancer of lower-outer quadrant of right female breast ([place]) 12/05/2014  . Depression   . FH: chemotherapy 12/2014-04/2015  . Hot flashes   . Hypertension   . 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 Radiation 07/23/15-09/05/15   right breast 50.4 Gy, boost of 10 Gy    Past Surgical History:  Procedure Laterality Date  . APPLICATION OF  CRANIAL NAVIGATION Right 07/27/2016   Procedure: APPLICATION OF CRANIAL NAVIGATION;  Surgeon: Kevan Ny Ditty, MD;  Location: [place];  Service: Neurosurgery;  Laterality: Right;  . BREAST LUMPECTOMY WITH NEEDLE LOCALIZATION AND AXILLARY SENTINEL LYMPH NODE BX Right 06/16/2015   Procedure: BREAST LUMPECTOMY WITH NEEDLE LOCALIZATION AND AXILLARY SENTINEL LYMPH NODE BX;  Surgeon: [name], MD;  Location: [place];  Service: General;  Laterality: Right;  . BREAST REDUCTION SURGERY    . CRANIOTOMY Right 07/27/2016   Procedure: Right Suboccipital craniotomy for tumor resection with stereotactic navigation;  Surgeon: Kevan Ny Ditty, MD;  Location: [place];  Service: Neurosurgery;  Laterality: Right;  . PORT-A-CATH REMOVAL N/A 06/16/2015   Procedure: REMOVAL PORT-A-CATH;  Surgeon: [name], MD;  Location: [facility];  Service: General;  Laterality: N/A;  . PORTACATH PLACEMENT N/A 12/23/2014   Procedure: INSERTION PORT-A-CATH;  Surgeon: [name], MD;  Location: [facility];  Service: General;  Laterality: N/A;    Social History   Social History  . Marital status: Divorced    Spouse name: N/A  . Number of children: 1  . Years of education: N/A   Occupational History  .     Social History Main Topics  . Smoking status: Never Smoker  . Smokeless tobacco: Never Used  . Alcohol use Yes     Comment: social  . Drug use: No  . Sexual activity: Yes   Other Topics Concern  . Not on file   Social History Narrative  . No narrative on file     FAMILY HISTORY:  We obtained a detailed, 4-generation family history.  Significant diagnoses are listed below: Family History  Problem Relation Age of Onset  . Aneurysm Mother 71       d.55  . Heart attack Father 6       d.62  . Endometrial cancer Sister 21  . Lung cancer Maternal Uncle        d.68s  . Cancer Maternal Grandmother        unspecified type-possibly stomach d.88s   Ms.  Kaitlyn Keith has four sisters, ages 33, 12, 60, and 56. The 58 year old sister, Kaitlyn Keith, accompanied her to her appointment. Kaitlyn Keith reports that she was diagnosed with uterine cancer at age 5. Her only treatment was hysterectomy. [name] has a son, age 54, who is healthy.  [name] mother died of an aneurysm at age 29. [name] has five maternal uncles and three maternal aunts. One uncle died with lung cancer in his late-60s. [name] maternal grandmother died in her late-80s and had a history of an unspecified form of cancer--possibly stomach. [name] maternal grandfather died in his late-80s without cancers.  Ms.  Alston's father died at age 10 from a heart attack. [name] has two paternal aunts who are both living without cancers. Her only paternal uncle died in his 38s in a car accident. Both of Ms. Alston's paternal grandparents are deceased, but there is no further information about them.  [name] is unaware of previous family history of genetic testing for hereditary cancer risks. Patient's maternal ancestors are of Native [place] and [place] American descent, and paternal ancestors are of African American descent. There is no reported Ashkenazi Jewish ancestry. There is no known consanguinity.  GENETIC COUNSELING ASSESSMENT: Sahithi Ordoyne is a 51 y.o. female with a personal history of triple negative breast cancer diagnosed at age 5 what is now metastatic diease. Furthermore, her sister has a history of endometrial/uterine cancer diagnosed at age 33. Both her personal and family history are suggestive of a hereditary cancer syndrome and predisposition to cancer. We, therefore, discussed and recommended the following at today's visit.   DISCUSSION: We reviewed the characteristics, features and inheritance patterns of hereditary cancer syndromes. We also discussed genetic testing, including the appropriate family members to test, the process of testing, insurance coverage  and turn-around-time for results. We discussed the implications of a negative, positive and/or variant of uncertain significant result. In order to get genetic test results in a timely manner so that [name] can use these genetic test results for treatment decisions, we recommended [name] pursue genetic testing for the 9-gene High/Moderate Breast Risk STAT Panel offered by [facility]. If this test is negative, we then recommend [name] pursue reflex genetic testing to the 46-gene Common Hereditary Cancers Panel.   Based on Ms. Alston's personal and family history of cancer, she meets medical criteria for genetic testing. Despite that she meets criteria, she may still have an out of pocket cost. We discussed that if her out of pocket cost for testing is over $100, the laboratory will call and confirm whether she wants to proceed with testing.  If the out of pocket cost of testing is less than $100 she will be billed by the genetic testing laboratory.   PLAN: After considering the risks, benefits, and limitations, [name]  provided informed consent to pursue genetic testing and the blood sample was sent to [facility] for analysis of the 9-gene High/Moderate Breast Risk Panel with reflex to the 46-gene Common Hereditary Cancers Panel. Results should be available within approximately 2 weeks' time, at which point they will be disclosed by telephone to [name], as will any additional recommendations warranted by these results. This information will also be available in Epic.   Lastly, we encouraged [name] to remain in contact with cancer genetics annually so that we can continuously update the family history and inform her of any changes in cancer genetics and testing that may be of benefit for this family.   Ms.  Kaitlyn Keith questions were answered to her satisfaction today. Our contact information was provided should additional questions or concerns arise. Thank you for the referral and  allowing [place] to share in the care of your patient.   Mal Misty, MS, [facility] Certified Naval [email or web address] phone: [phone]  The patient was seen for a total of 20 minutes in face-to-face genetic counseling.  This patient was discussed with Drs. Magrinat, Lindi Adie and/or Burr Medico who agrees with the above.    _______________________________________________________________________ For Office Staff:  Number of people involved in session: 2 Was an Intern/ student involved with case: no

## 2016-11-22 ENCOUNTER — Telehealth: Payer: Self-pay

## 2016-11-22 NOTE — Telephone Encounter (Signed)
Spoke with pt to confirm her appt for US biopsy of left inguinal lymph node. Pt scheduled next Monday 11/29/16 at [facility] at [place]. Nothing to eat/drink after midnight before procedure. Will take 4-5 hrs for the procedure and will require to have a driver upon discharge. Pt verbalized understanding and confirmed time/date of appt. Pt also requesting disability letter from [employer], stating that she is no longer able to work due to her metastatic cancer. Told pt will give her letter on her next appt with Dr.Gudena. Pt appreciative of time and call. No further questions or concerns at this time.

## 2016-11-25 ENCOUNTER — Encounter: Payer: Self-pay | Admitting: Genetics

## 2016-11-25 ENCOUNTER — Other Ambulatory Visit: Payer: Self-pay | Admitting: Student

## 2016-11-26 ENCOUNTER — Other Ambulatory Visit: Payer: Self-pay | Admitting: Radiology

## 2016-11-29 ENCOUNTER — Ambulatory Visit (HOSPITAL_COMMUNITY)
Admission: RE | Admit: 2016-11-29 | Discharge: 2016-11-29 | Disposition: A | Discharge status: Home / Self Care | Payer: BLUE CROSS/BLUE SHIELD | Source: Ambulatory Visit | Attending: Hematology and Oncology | Admitting: Hematology and Oncology

## 2016-11-29 ENCOUNTER — Encounter (HOSPITAL_COMMUNITY): Payer: Self-pay

## 2016-11-29 ENCOUNTER — Telehealth: Payer: Self-pay

## 2016-11-29 DIAGNOSIS — Z9889 Other specified postprocedural states: Secondary | ICD-10-CM | POA: Insufficient documentation

## 2016-11-29 DIAGNOSIS — C50511 Malignant neoplasm of lower-outer quadrant of right female breast: Secondary | ICD-10-CM | POA: Diagnosis not present

## 2016-11-29 DIAGNOSIS — I700 Atherosclerosis of aorta: Secondary | ICD-10-CM | POA: Diagnosis not present

## 2016-11-29 DIAGNOSIS — C7931 Secondary malignant neoplasm of brain: Secondary | ICD-10-CM | POA: Insufficient documentation

## 2016-11-29 DIAGNOSIS — Z171 Estrogen receptor negative status [ER-]: Secondary | ICD-10-CM | POA: Diagnosis present

## 2016-11-29 DIAGNOSIS — F329 Major depressive disorder, single episode, unspecified: Secondary | ICD-10-CM | POA: Insufficient documentation

## 2016-11-29 DIAGNOSIS — F419 Anxiety disorder, unspecified: Secondary | ICD-10-CM | POA: Insufficient documentation

## 2016-11-29 DIAGNOSIS — R590 Localized enlarged lymph nodes: Secondary | ICD-10-CM | POA: Insufficient documentation

## 2016-11-29 LAB — CBC
HCT: 45.9 % (ref 36.0–46.0)
HEMOGLOBIN: 14.8 g/dL (ref 12.0–15.0)
MCH: 29.2 pg (ref 26.0–34.0)
MCHC: 32.2 g/dL (ref 30.0–36.0)
MCV: 90.5 fL (ref 78.0–100.0)
PLATELETS: 213 10*3/uL (ref 150–400)
RBC: 5.07 MIL/uL (ref 3.87–5.11)
RDW: 12.7 % (ref 11.5–15.5)
WBC: 4.6 10*3/uL (ref 4.0–10.5)

## 2016-11-29 LAB — PROTIME-INR
INR: 1.05
Prothrombin Time: 13.7 seconds (ref 11.4–15.2)

## 2016-11-29 LAB — APTT: aPTT: 28 seconds (ref 24–36)

## 2016-11-29 MED ORDER — LIDOCAINE HCL (PF) 1 % IJ SOLN
INTRAMUSCULAR | Status: AC
  Filled 2016-11-29: qty 30

## 2016-11-29 MED ORDER — MIDAZOLAM HCL 2 MG/2ML IJ SOLN
INTRAMUSCULAR | Status: AC | PRN
  Administered 2016-11-29 (×2): 1 mg via INTRAVENOUS

## 2016-11-29 MED ORDER — FENTANYL CITRATE (PF) 100 MCG/2ML IJ SOLN
INTRAMUSCULAR | Status: AC
  Filled 2016-11-29: qty 2

## 2016-11-29 MED ORDER — SODIUM CHLORIDE 0.9 % IV SOLN: INTRAVENOUS | Status: DC

## 2016-11-29 MED ORDER — MIDAZOLAM HCL 2 MG/2ML IJ SOLN
INTRAMUSCULAR | Status: AC
  Filled 2016-11-29: qty 2

## 2016-11-29 MED ORDER — FENTANYL CITRATE (PF) 100 MCG/2ML IJ SOLN
INTRAMUSCULAR | Status: AC | PRN
  Administered 2016-11-29 (×2): 50 ug via INTRAVENOUS

## 2016-11-29 NOTE — Procedures (Signed)
Operator -  [name] Pre op Dx - Adenopathy Post op Dx - Adenoapthy Right inguinal LN Core Bx 18 g times three EBL 0 Comp 0

## 2016-11-29 NOTE — Telephone Encounter (Signed)
Contacted [facility] to notify them of Dr.Gudena orders to send for foundation 1 and pd1 from today's US biopsy sample. Spoke with Tammy from pathology.

## 2016-11-29 NOTE — Sedation Documentation (Signed)
Pt is very anxious, procedure started.

## 2016-11-29 NOTE — Sedation Documentation (Signed)
Biopsies taken °

## 2016-11-29 NOTE — Sedation Documentation (Signed)
Patient is resting comfortably. 

## 2016-11-29 NOTE — Sedation Documentation (Signed)
Vital signs stable. 

## 2016-11-29 NOTE — Discharge Instructions (Addendum)
Needle Biopsy, Care After °Refer to this sheet in the next few weeks. These instructions provide you with information about caring for yourself after your procedure. Your health care provider may also give you more specific instructions. Your treatment has been planned according to current medical practices, but problems sometimes occur. Call your health care provider if you have any problems or questions after your procedure. °What can I expect after the procedure? °After your procedure, it is common to have soreness, bruising, or mild pain at the biopsy site. This should go away in a few days. °Follow these instructions at home: °· Rest as directed by your health care provider. °· Take medicines only as directed by your health care provider. °· There are many different ways to close and cover the biopsy site, including stitches (sutures), skin glue, and adhesive strips. Follow your health care provider's instructions about: °? Biopsy site care. °? Bandage (dressing) changes and removal. °? Biopsy site closure removal. °· Check your biopsy site every day for signs of infection. Watch for: °? Redness, swelling, or pain. °? Fluid, blood, or pus. °Contact a health care provider if: °· You have a fever. °· You have redness, swelling, or pain at the biopsy site that lasts longer than a few days. °· You have fluid, blood, or pus coming from the biopsy site. °· You feel nauseous. °· You vomit. °Get help right away if: °· You have shortness of breath. °· You have trouble breathing. °· You have chest pain. °· You feel dizzy or you faint. °· You have bleeding that does not stop with pressure or a bandage. °· You cough up blood. °· You have pain in your abdomen. °This information is not intended to replace advice given to you by your health care provider. Make sure you discuss any questions you have with your health care provider. °Document Released: 09/24/2014 Document Revised: 10/16/2015 Document Reviewed:  05/06/2014 °Elsevier Interactive Patient Education © 2018 [employer] °Moderate Conscious Sedation, Adult, Care After °These instructions provide you with information about caring for yourself after your procedure. Your health care provider may also give you more specific instructions. Your treatment has been planned according to current medical practices, but problems sometimes occur. Call your health care provider if you have any problems or questions after your procedure. °What can I expect after the procedure? °After your procedure, it is common: °· To feel sleepy for several hours. °· To feel clumsy and have poor balance for several hours. °· To have poor judgment for several hours. °· To vomit if you eat too soon. ° °Follow these instructions at home: °For at least 24 hours after the procedure: ° °· Do not: °? Participate in activities where you could fall or become injured. °? Drive. °? Use heavy machinery. °? Drink alcohol. °? Take sleeping pills or medicines that cause drowsiness. °? Make important decisions or sign legal documents. °? Take care of children on your own. °· Rest. °Eating and drinking °· Follow the diet recommended by your health care provider. °· If you vomit: °? Drink water, juice, or soup when you can drink without vomiting. °? Make sure you have little or no nausea before eating solid foods. °General instructions °· Have a responsible adult stay with you until you are awake and alert. °· Take over-the-counter and prescription medicines only as told by your health care provider. °· If you smoke, do not smoke without supervision. °· Keep all follow-up visits as told by your health care   provider. This is important. °Contact a health care provider if: °· You keep feeling nauseous or you keep vomiting. °· You feel light-headed. °· You develop a rash. °· You have a fever. °Get help right away if: °· You have trouble breathing. °This information is not intended to replace advice given to you  by your health care provider. Make sure you discuss any questions you have with your health care provider. °Document Released: 02/28/2013 Document Revised: 10/13/2015 Document Reviewed: 08/30/2015 °Elsevier Interactive Patient Education © 2018 [employer] Inc[employer] ° °

## 2016-11-29 NOTE — Sedation Documentation (Signed)
Pt complains of pain 

## 2016-11-29 NOTE — Consult Note (Signed)
Chief Complaint: Patient was seen in consultation today for inguinal lymphadenopathy  Referring Physician(s):  Gudena,Vinay  Supervising Physician: Marybelle Killings  Patient Status: [facility] - Out-pt  History of Present Illness: Kaitlyn Keith is a 51 y.o. female with past medical history of metastatic breast cancer who presents with newly identified subcutaneous nodules.   PET 11/18/16: 1. FDG avid subcutaneous nodules in the neck, upper back, left arm, abdomen, and pelvis are all most consistent with metastatic disease. FDG avid peritoneal and pelvic nodules described above are also most consistent with metastatic disease. 2. Normal size nodes in the left axilla and left retropectoral region demonstrate mild increased uptake. This is nonspecific given the left-sided FDG injection. Recommend attention on follow-up. 3. A focus of uptake in the muscle adjacent to the humerus is nonspecific given a left-sided FDG injection as well. Recommend attention on follow-up. 4. Mild uptake in the left hilum and the liver without CT correlate is nonspecific. Recommend attention on follow-up. 5. Mildly heterogeneous uptake in the pelvic bones without discrete metastasis. Recommend attention on follow-up.  IR consulted for biopsy of subcutaneous nodules vs. Inguinal lymph node. Case reviewed by Dr. Earleen Newport.   Patient presents today in her usual state of health.  She has been NPO.  She does not take blood thinners.    Past Medical History:  Diagnosis Date  . Anxiety   . Arthritis   . Back pain   . Brain cancer ([place])   . Breast cancer ([place])   . Breast cancer of lower-outer quadrant of right female breast ([place]) 12/05/2014  . Depression   . FH: chemotherapy 12/2014-04/2015  . Hot flashes   . Hypertension   . 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 Radiation 07/23/15-09/05/15   right breast 50.4 Gy, boost of 10 Gy    Past Surgical History:  Procedure  Laterality Date  . APPLICATION OF CRANIAL NAVIGATION Right 07/27/2016   Procedure: APPLICATION OF CRANIAL NAVIGATION;  Surgeon: Kevan Ny Ditty, MD;  Location: [place];  Service: Neurosurgery;  Laterality: Right;  . BREAST LUMPECTOMY WITH NEEDLE LOCALIZATION AND AXILLARY SENTINEL LYMPH NODE BX Right 06/16/2015   Procedure: BREAST LUMPECTOMY WITH NEEDLE LOCALIZATION AND AXILLARY SENTINEL LYMPH NODE BX;  Surgeon: [name], MD;  Location: [place];  Service: General;  Laterality: Right;  . BREAST REDUCTION SURGERY    . CRANIOTOMY Right 07/27/2016   Procedure: Right Suboccipital craniotomy for tumor resection with stereotactic navigation;  Surgeon: Kevan Ny Ditty, MD;  Location: [place];  Service: Neurosurgery;  Laterality: Right;  . PORT-A-CATH REMOVAL N/A 06/16/2015   Procedure: REMOVAL PORT-A-CATH;  Surgeon: [name], MD;  Location: [place];  Service: General;  Laterality: N/A;  . PORTACATH PLACEMENT N/A 12/23/2014   Procedure: INSERTION PORT-A-CATH;  Surgeon: [name], MD;  Location: [place];  Service: General;  Laterality: N/A;    Allergies: Patient has no known allergies.  Medications: Prior to Admission medications   Medication Sig Start Date End Date Taking? Authorizing Provider  ALPRAZolam Duanne Moron) 1 MG tablet Take 2 tablets (2 mg total) by mouth at bedtime. Patient taking differently: Take 1 mg by mouth 2 (two) times daily as needed for anxiety or sleep.  10/21/15  Yes [name], MD  lamoTRIgine (LAMICTAL) 100 MG tablet Take 1 tablet by mouth daily. 09/28/16  Yes [provider]  lisinopril-hydrochlorothiazide (PRINZIDE,ZESTORETIC) 20-12.5 MG tablet Take 1 tablet by  mouth 2 (two) times daily. 03/25/15  Yes [name], MD  traZODone (DESYREL) 100 MG tablet Take 1 tablet by mouth daily. 09/28/16  Yes [provider]  Vitamin D, Ergocalciferol, (DRISDOL) 50000 UNITS CAPS capsule Take 1 capsule (50,000  Units total) by mouth every 7 (seven) days. Patient taking differently: Take 50,000 Units by mouth every 7 (seven) days. mondays 03/25/15  Yes [name], MD  DULoxetine (CYMBALTA) 20 MG capsule Take 2 capsules (40 mg total) by mouth daily. 09/19/15   [name], MD  oxyCODONE-acetaminophen (PERCOCET) 7.5-325 MG tablet Take 1-2 tablets by mouth every 4 hours as needed for pain. 07/29/16   Costella, Vista Mink, PA-C     Family History  Problem Relation Age of Onset  . Aneurysm Mother 25       d.55  . Heart attack Father 10       d.62  . Endometrial cancer Sister 54  . Lung cancer Maternal Uncle        d.68s  . Cancer Maternal Grandmother        unspecified type-possibly stomach d.88s    Social History   Social History  . Marital status: Divorced    Spouse name: N/A  . Number of children: 1  . Years of education: N/A   Occupational History  .     Social History Main Topics  . Smoking status: Never Smoker  . Smokeless tobacco: Never Used  . Alcohol use Yes     Comment: social  . Drug use: No  . Sexual activity: Yes   Other Topics Concern  . None   Social History Narrative  . None    Review of Systems  Constitutional: Negative for fatigue and fever.  Respiratory: Negative for cough and shortness of breath.   Cardiovascular: Negative for chest pain.  Gastrointestinal: Negative for abdominal pain.  Psychiatric/Behavioral: Negative for behavioral problems and confusion.    Vital Signs: BP (!) 159/110   Pulse (!) 59   Temp 98.2 F (36.8 C) (Oral)   Resp 16   Ht 5\' 3"  (1.6 m)   Wt 177 lb (80.3 kg)   SpO2 100%   BMI 31.35 kg/m   Physical Exam  Constitutional: She is oriented to person, place, and time. She appears well-developed.  Cardiovascular: Normal rate, regular rhythm and normal heart sounds.   Pulmonary/Chest: Effort normal and breath sounds normal.  Neurological: She is alert and oriented to person, place, and time.  Skin: Skin is warm and dry.    Psychiatric: She has a normal mood and affect. Her behavior is normal. Judgment and thought content normal.  Nursing note and vitals reviewed.   Mallampati Score:  MD Evaluation Airway: WNL Heart: WNL Abdomen: WNL Chest/ Lungs: WNL ASA  Classification: 3 Mallampati/Airway Score: Two  Imaging: Ct Chest W Contrast  Result Date: 11/06/2016 CLINICAL DATA:  Breast cancer. EXAM: CT CHEST, ABDOMEN, AND PELVIS WITH CONTRAST TECHNIQUE: Multidetector CT imaging of the chest, abdomen and pelvis was performed following the standard protocol during bolus administration of intravenous contrast. CONTRAST:  145mL ISOVUE-300 IOPAMIDOL (ISOVUE-300) INJECTION 61% COMPARISON:  07/12/2016 FINDINGS: CT CHEST FINDINGS Cardiovascular: The heart size appears within normal limits. The trachea appears patent and is midline. Normal appearance of the esophagus. Mediastinum/Nodes: No enlarged mediastinal or hilar lymph nodes identified. Right axillary nodal dissection identified. Postsurgical changes within the right breast noted, image 31 of series 2. No supraclavicular or axillary adenopathy. Lungs/Pleura: No pleural effusion. No suspicious pulmonary nodule or mass.  Musculoskeletal: No aggressive lytic or sclerotic bone lesions. CT ABDOMEN PELVIS FINDINGS Hepatobiliary: 6 mm low density structure within the later segment of the left lobe, image 43 series 2. Stable from prior exam. 8 mm segment 5 liver lesion is unchanged from the previous exam, image 54 series 2. The gallbladder appears normal. Pancreas: Unremarkable. No pancreatic ductal dilatation or surrounding inflammatory changes. Spleen: Normal in size without focal abnormality. Adrenals/Urinary Tract: Adrenal glands are unremarkable. Kidneys are normal, without renal calculi, focal lesion, or hydronephrosis. Bladder is unremarkable. Stomach/Bowel: Stomach is within normal limits. Appendix appears normal. No evidence of bowel wall thickening, distention, or  inflammatory changes. Vascular/Lymphatic: Aortic atherosclerosis. No aneurysm. No enlarged upper abdominal lymph nodes. No pelvic or inguinal adenopathy. Reproductive: Uterus and adnexal structures are unremarkable. Other: There is a well-circumscribed rounded soft tissue attenuating structure within the central pelvis, image 103 of series 2 and image 55 of series 5. This is directly adjacent to a loop of pelvic small bowel. A smaller 7 mm nodule is identified just anterior to the descending colon, image 77 of series 2. Also within the left hemiabdomen there is a 7 mm soft tissue nodule, image 75 of series 2. This is just below the ventral abdominal wall. Musculoskeletal: No aggressive lytic or sclerotic bone lesion. IMPRESSION: 1. There are at least 3 small soft tissue attenuating nodules within the peritoneal cavity which are new from prior examination. Suspicious for peritoneal metastasis. 2. Stable ill defined and subcentimeter low-attenuation structures within the liver. 3. No evidence for thoracic metastasis. Electronically Signed   By: Kerby Moors M.D.   On: 11/06/2016 11:13   Mr Jeri Cos KZ Contrast  Result Date: 11/16/2016 CLINICAL DATA:  Secondary malignant neoplasm of brain and spinal cord. Four month follow-up stereotactic radiosurgery. Breast cancer. Creatinine was obtained on site at [facility] at [address] Results: Creatinine 0.7 mg/dL. EXAM: MRI HEAD WITHOUT AND WITH CONTRAST TECHNIQUE: Multiplanar, multiecho pulse sequences of the brain and surrounding structures were obtained without and with intravenous contrast. CONTRAST:  110mL MULTIHANCE GADOBENATE DIMEGLUMINE 529 MG/ML IV SOLN COMPARISON:  MRI head 07/28/2016, 07/17/2016 FINDINGS: Brain: Postop right occipital craniotomy for removal of metastatic disease in the right cerebellum. Mild encephalomalacia right lateral cerebellum. No residual mass or fluid collection. Negative for acute infarct. Ventricle size normal. No  enhancing metastatic deposits identified in the brain. Normal enhancement in the right cerebellar surgical field. Vascular: Normal arterial flow voids. Skull and upper cervical spine: Chronic anterolisthesis infusion of C2 on C3 most likely due to chronic trauma. Discogenic sclerosis in the vertebral bodies unchanged from prior CT. No worrisome skull lesion. Sinuses/Orbits: Negative Other: None IMPRESSION: Postop resection of right cerebellar metastatic deposit. Satisfactory postoperative appearance. No enhancing lesions in the brain. Electronically Signed   By: Franchot Gallo M.D.   On: 11/16/2016 15:00   Ct Abdomen Pelvis W Contrast  Result Date: 11/06/2016 CLINICAL DATA:  Breast cancer. EXAM: CT CHEST, ABDOMEN, AND PELVIS WITH CONTRAST TECHNIQUE: Multidetector CT imaging of the chest, abdomen and pelvis was performed following the standard protocol during bolus administration of intravenous contrast. CONTRAST:  165mL ISOVUE-300 IOPAMIDOL (ISOVUE-300) INJECTION 61% COMPARISON:  07/12/2016 FINDINGS: CT CHEST FINDINGS Cardiovascular: The heart size appears within normal limits. The trachea appears patent and is midline. Normal appearance of the esophagus. Mediastinum/Nodes: No enlarged mediastinal or hilar lymph nodes identified. Right axillary nodal dissection identified. Postsurgical changes within the right breast noted, image 31 of series 2. No supraclavicular or axillary adenopathy. Lungs/Pleura:  No pleural effusion. No suspicious pulmonary nodule or mass. Musculoskeletal: No aggressive lytic or sclerotic bone lesions. CT ABDOMEN PELVIS FINDINGS Hepatobiliary: 6 mm low density structure within the later segment of the left lobe, image 43 series 2. Stable from prior exam. 8 mm segment 5 liver lesion is unchanged from the previous exam, image 54 series 2. The gallbladder appears normal. Pancreas: Unremarkable. No pancreatic ductal dilatation or surrounding inflammatory changes. Spleen: Normal in size without  focal abnormality. Adrenals/Urinary Tract: Adrenal glands are unremarkable. Kidneys are normal, without renal calculi, focal lesion, or hydronephrosis. Bladder is unremarkable. Stomach/Bowel: Stomach is within normal limits. Appendix appears normal. No evidence of bowel wall thickening, distention, or inflammatory changes. Vascular/Lymphatic: Aortic atherosclerosis. No aneurysm. No enlarged upper abdominal lymph nodes. No pelvic or inguinal adenopathy. Reproductive: Uterus and adnexal structures are unremarkable. Other: There is a well-circumscribed rounded soft tissue attenuating structure within the central pelvis, image 103 of series 2 and image 55 of series 5. This is directly adjacent to a loop of pelvic small bowel. A smaller 7 mm nodule is identified just anterior to the descending colon, image 77 of series 2. Also within the left hemiabdomen there is a 7 mm soft tissue nodule, image 75 of series 2. This is just below the ventral abdominal wall. Musculoskeletal: No aggressive lytic or sclerotic bone lesion. IMPRESSION: 1. There are at least 3 small soft tissue attenuating nodules within the peritoneal cavity which are new from prior examination. Suspicious for peritoneal metastasis. 2. Stable ill defined and subcentimeter low-attenuation structures within the liver. 3. No evidence for thoracic metastasis. Electronically Signed   By: Kerby Moors M.D.   On: 11/06/2016 11:13   Nm Pet Image Initial (pi) Skull Base To Thigh  Result Date: 11/17/2016 CLINICAL DATA:  Known breast cancer. A metastatic lesion in the brain was recently removed. Suspected peritoneal metastases seen on the November 05, 2016 CT scan of the chest, abdomen, and pelvis. EXAM: NUCLEAR MEDICINE PET SKULL BASE TO THIGH TECHNIQUE: 9.02 mCi F-18 FDG was injected intravenously. Full-ring PET imaging was performed from the skull base to thigh after the radiotracer. CT data was obtained and used for attenuation correction and anatomic  localization. FASTING BLOOD GLUCOSE:  Value: 80 mg/dl COMPARISON:  November 05, 2016 CT scan. FINDINGS: NECK There is a nodule in the base of the neck on the left on CT image 35 measuring 10 mm which is mildly FDG avid with a maximum SUV of 3.5. There is an FDG avid nodule is seen in the left upper back at the junction of muscle and subcutaneous fat on image 35, also mildly FDG avid. Increased uptake in the thyroid may be due to thyroiditis. CHEST Postoperative changes seen in the right breast. No abnormal uptake seen in the right breast. No axillary adenopathy on the right. Several mildly avid normal-size nodes are seen in the left axilla. One of the nodes is just behind the lateral edge of the left pectoralis muscle on series 4, image 54. By CT criteria, these nodes are normal in size. The mild increased uptake could be due to the left-sided FDG injection but is nonspecific. The most avid node is seen on image 52 with a maximum SUV of 2.9. There is a soft tissue nodule in the posterior left upper arm on CT image 13 which is FDG avid. A focus of higher uptake is seen at the same level in the soft tissues adjacent to the humerus with a maximum SUV of 9.4. By  report, the injection was in the left antecubital fossa and this is superior to the antecubital fossa. No definitive FDG avid nodes are seen in the mediastinum or hila. No FDG avid nodules seen in the lungs. There is uptake at the first costochondral junction on image 53 which is nonspecific. ABDOMEN/PELVIS There is a focus of uptake is seen in the liver on PET image 92 with no CT correlate. The maximum SUV in this region is 4.1. Several subcutaneous nodules are identified with abnormal FDG uptake. At least 5 of these nodules are seen on CT images 115, 142, 163, 176, and 191. The nodule on image 115 demonstrates a maximum SUV of 2.8. A peritoneal nodule to the left of midline anteriorly on CT image 119 is FDG avid. A nodule in the left flank anterior to the sigmoid  colon on CT image 124 is likely mildly FDG avid but difficult to differentiate from adjacent colon. A soft tissue nodule in the right side of the pelvis on series 4, image 168 is identified. There is rounded uptake adjacent to this nodule, likely due to misregistration. This nodule measures 9 mm with a maximum SUV of 8.9. Inguinal nodes are mildly FDG avid. No masses seen in this region on the recent contrast-enhanced CT scan. The low-attenuation lesions in the liver on the recent contrast-enhanced CT scan are not FDG avid. SKELETON Uptake in the pelvis is mildly heterogeneous but no discrete bony metastases are identified. No focal lesions are seen on CT imaging. IMPRESSION: 1. FDG avid subcutaneous nodules in the neck, upper back, left arm, abdomen, and pelvis are all most consistent with metastatic disease. FDG avid peritoneal and pelvic nodules described above are also most consistent with metastatic disease. 2. Normal size nodes in the left axilla and left retropectoral region demonstrate mild increased uptake. This is nonspecific given the left-sided FDG injection. Recommend attention on follow-up. 3. A focus of uptake in the muscle adjacent to the humerus is nonspecific given a left-sided FDG injection as well. Recommend attention on follow-up. 4. Mild uptake in the left hilum and the liver without CT correlate is nonspecific. Recommend attention on follow-up. 5. Mildly heterogeneous uptake in the pelvic bones without discrete metastasis. Recommend attention on follow-up. Electronically Signed   By: Dorise Bullion III M.D   On: 11/17/2016 14:08    Labs:  CBC:  Recent Labs  07/23/16 4982 07/28/16 0625 11/18/16 1539 11/29/16 0648  WBC 10.6* 15.0* 4.9 4.6  HGB 15.2* 13.1 14.3 14.8  HCT 45.4 39.7 43.7 45.9  PLT 236 182 203 213    COAGS:  Recent Labs  07/23/16 0853 11/29/16 0648  INR 1.00 1.05  APTT 24 28    BMP:  Recent Labs  07/12/16 0614 07/23/16 0853 07/28/16 0700  11/18/16 1539  NA 137 138 133* 143  K 3.6 3.5 3.5 3.4*  CL 103 103 101  --   CO2 24 26 24 28   GLUCOSE 109* 103* 131* 88  BUN 7 14 13  7.8  CALCIUM 9.3 9.6 8.3* 9.4  CREATININE 0.66 0.73 0.61 0.8  GFRNONAA >60 >60 >60  --   GFRAA >60 >60 >60  --     LIVER FUNCTION TESTS:  Recent Labs  11/18/16 1539  BILITOT 0.44  AST 15  ALT 11  ALKPHOS 99  PROT 7.1  ALBUMIN 3.7    TUMOR MARKERS: No results for input(s): AFPTM, CEA, CA199, CHROMGRNA in the last 8760 hours.  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  IR consulted for inguinal lymph node biopsy at the request of Dr. Lindi Adie.  Patient presents today in her usual state of health.  She has been NPO.  She does not take blood thinners.  Risks and Benefits discussed with the patient including, but not limited to bleeding, infection, damage to adjacent structures or low yield requiring additional tests. All of the patient's questions were answered, patient is agreeable to proceed. Consent signed and in chart.  Thank you for this interesting consult.  I greatly enjoyed meeting Bre Pecina and look forward to participating in their care.  A copy of this report was sent to the requesting provider on this date.  Electronically Signed: Docia Barrier, PA 11/29/2016, 7:54 AM   I spent a total of  30 Minutes   in face to face in clinical consultation, greater than 50% of which was counseling/coordinating care for inguinal lymphadenopathy

## 2016-12-01 ENCOUNTER — Telehealth: Payer: Self-pay | Admitting: Genetics

## 2016-12-01 NOTE — Telephone Encounter (Signed)
Reviewed that the 9-gene high/moderate breast risk STAT panel performed by [facility] was negative for pathogenic mutations. We discussed that a variant of uncertain significance (VUS) called c.6125A>C (p.Gln2042Pro) was identified in BRCA2. Typically, the presence of a VUS should not be used to guide cancer treatments and surveillance as the vast majority of VUSs are later determined to be benign/normal differences in the genetic code of the gene. However, I discussed with [name] that I will alert Dr. Lindi Adie of her results and will defer to him as to whether use of a PARP inhibitor is indicated. [name] requested that I contact her sister to discuss results as well. I attempted to reach [name] at [phone] and left a message requesting a return call.  Will reflex to remaining genes included on [facility]'s 46-gene Common Hereditary Cancer panel. Will call patient when remaining results are available. Final risk assessment and documentation will be made at that time. A portion of her STAT panel results are included below for reference.

## 2016-12-02 ENCOUNTER — Telehealth: Payer: Self-pay | Admitting: *Deleted

## 2016-12-02 ENCOUNTER — Other Ambulatory Visit (HOSPITAL_COMMUNITY)
Admission: RE | Admit: 2016-12-02 | Discharge: 2016-12-02 | Disposition: A | Discharge status: Home / Self Care | Payer: BLUE CROSS/BLUE SHIELD | Source: Ambulatory Visit | Attending: Hematology and Oncology | Admitting: Hematology and Oncology

## 2016-12-02 DIAGNOSIS — R590 Localized enlarged lymph nodes: Secondary | ICD-10-CM | POA: Insufficient documentation

## 2016-12-02 NOTE — Telephone Encounter (Signed)
Called pt with bx results. Discussed Dr. Lindi Adie would like an excision bx to further evaluation nodules. Scheduled and confirmed appt with Dr. Marlou Starks on 12/14/16 at 11:15 to discuss excisional bx.

## 2016-12-02 NOTE — Telephone Encounter (Signed)
Per Dr. Lindi Adie order, [facility] testing order called in to pathology. Received by Tammy. Pt aware of testing

## 2016-12-10 ENCOUNTER — Encounter: Payer: Self-pay | Admitting: Genetics

## 2016-12-10 ENCOUNTER — Telehealth: Payer: Self-pay | Admitting: Genetics

## 2016-12-10 ENCOUNTER — Ambulatory Visit: Payer: Self-pay | Admitting: Genetics

## 2016-12-10 DIAGNOSIS — Z1509 Genetic susceptibility to other malignant neoplasm: Secondary | ICD-10-CM | POA: Insufficient documentation

## 2016-12-10 DIAGNOSIS — Z1379 Encounter for other screening for genetic and chromosomal anomalies: Secondary | ICD-10-CM

## 2016-12-10 HISTORY — DX: Encounter for other screening for genetic and chromosomal anomalies: Z13.79

## 2016-12-10 HISTORY — DX: Genetic susceptibility to other malignant neoplasm: Z15.09

## 2016-12-10 NOTE — Telephone Encounter (Signed)
Reviewed results of germline genetic testing. 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 Discussed that MSH6 mutations are associated with hereditary cancer syndrome called Lynch syndrome. Briefly reviewed risks and management recommendations associated with Lynch syndrome.  Discussed that Dr. Lindi Adie is ordering more tumor testing to determine if [name] is a candidate for immunotherapy and [name] should follow-up with Dr. Lindi Adie regarding more specific information about her treatment options for metastatic breast cancer. Recommended that [name]'s son and sisters undergo genetic counseling and testing as each has a 50% chance to also carry this mutation. [name] was informed that if her sisters and son undergo genetic testing through [facility] within 90 days of 12/09/2016, their testing can be performed for free. If they do not undergo genetic testing during this timeframe, there are still many testing options that are often covered by insurance or can be performed through financial assistance and self-pay.  No other pathogenic mutations were identified through [name]'s testing. The following 3 variants of uncertain significance were noted. VUSs should not be used for clinical management. BARD1 c.469G>A (Val157Ile) BRCA2 c.6125A>C (p.Gln2042Pro) NF1 c.4354G>A (p.Aso1452Asn)  Testing was performed through [facility]'s 46-gene Common Hereditary Cancers Panel. [facility]'s Common Hereditary Cancers Panel includes analysis of the following 46 genes: APC, ATM, AXIN2, BARD1, BMPR1A, BRCA1, BRCA2, BRIP1, CDH1, CDKN2A, CHEK2, CTNNA1, DICER1, EPCAM, GREM1, HOXB13, KIT, MEN1, MLH1, MSH2, MSH3, MSH6, MUTYH, NBN, NF1, NTHL1, PALB2, PDGFRA, PMS2, POLD1, POLE, PTEN, RAD50, RAD51C, RAD51D, SDHA, SDHB, SDHC, SDHD, SMAD4, SMARCA4, STK11, TP53, TSC1, TSC2, and VHL. For more detailed discussion, please see genetic  counseling documentation from 12/10/2016. Result report dated 12/09/2016.

## 2016-12-10 NOTE — Progress Notes (Signed)
REFERRING PROVIDER: [name], MD  PRIMARY PROVIDER:  Secundino Ginger, PA-C  PRIMARY REASON FOR VISIT:  1. Genetic testing   2. MSH6-related Lynch syndrome [facility])     GENETIC TEST RESULTS  Patient Name: Statia Burdick Patient Age: 51 y.o. Encounter Date: 12/10/2016  HPI: [name] was previously seen in the [place] clinic on 11/18/2016 due to due to a personal history of breast cancer and metastatic disease. Please refer to our prior cancer genetics clinic note for more information regarding Ms. Alston's medical, social and family histories, and our assessment and recommendations, at the time. Ms. Sherene Sires recent genetic test results were disclosed to her, as were recommendations warranted by these results. These results and recommendations are discussed in more detail below.   FAMILY HISTORY:  We obtained a detailed, 4-generation family history.  Significant diagnoses are listed below: Family History  Problem Relation Age of Onset  . Aneurysm Mother 71       d.55  . Heart attack Father 15       d.62  . Endometrial cancer Sister 3  . Lung cancer Maternal Uncle        d.68s  . Cancer Maternal Grandmother        unspecified type-possibly stomach d.88s   [name] has four sisters, ages 60, 28, 80, and 85. The 37 year old sister, [name], accompanied her to her initial genetic counseling appointment. [name] reports that she was diagnosed with uterine cancer at age 73. Her only treatment was hysterectomy. [name] has a son, age 63, who is healthy.  Ms. Sherene Sires mother died of an aneurysm at age 78. [name] has five maternal uncles and three maternal aunts. One uncle died with lung cancer in his late-60s. Ms. Sherene Sires maternal grandmother died in her late-80s and had a history of an unspecified form of cancer--possibly stomach. Ms. Sherene Sires maternal grandfather died in his late-80s without cancers.  Ms. Sherene Sires father died at age 13 from a  heart attack. [name] has two paternal aunts who are both living without cancers. Her only paternal uncle died in his 83s in a car accident. Both of Ms. Alston's paternal grandparents are deceased, but there is no further information about them.  [name] is unaware of previous family history of genetic testing for hereditary cancer risks. Patient's maternal ancestors are of Native [place] American descent, and paternal ancestors are of African American descent. There is no reported Ashkenazi Jewish ancestry. There is no known consanguinity.  GENETIC TESTING:  At the time of Ms. Alston's visit, we recommended she pursue genetic testing through [facility]'s Common Hereditary Cancers Panel. Ms. Sherene Sires genetic testing identified a single, heterozygous pathogenic gene mutation called MSH6, c.2832_2833delAA (p.Ile944Metfs*4), giving [name] a diagnosis of Lynch syndrome.   [facility] Common Hereditary Cancers Panel includes analysis of the following 46 genes: APC, ATM, AXIN2, BARD1, BMPR1A, BRCA1, BRCA2, BRIP1, CDH1, CDKN2A, CHEK2, CTNNA1, DICER1, EPCAM, GREM1, HOXB13, KIT, MEN1, MLH1, MSH2, MSH3, MSH6, MUTYH, NBN, NF1, NTHL1, PALB2, PDGFRA, PMS2, POLD1, POLE, PTEN, RAD50, RAD51C, RAD51D, SDHA, SDHB, SDHC, SDHD, SMAD4, SMARCA4, STK11, TP53, TSC1, TSC2, and VHL There were no additional pathogenic mutations identified in the remaining 45 genes analyzed. The following 3 variants of uncertain significance (VUSs) were noted: BARD1 c.469G>A (Val157Ile) BRCA2 c.6125A>C (p.Gln2042Pro) NF1 c.4354G>A (p.Aso1452Asn) At this time, it is unknown if any of these variants are associated with increased cancer risk or if these are normal findings, but most variants such as these get  reclassified to being inconsequential. VUSs should not be used to make medical management decisions. With time, we suspect the lab will determine the significance of these variants, if any. If we do learn more about any of  them, we will try to contact [name] to discuss it further. However, it is important to stay in touch with [place] periodically and keep the address and phone number up to date.  The test report will be scanned into EPIC and will be located under the Molecular Pathology section of the Results Review tab.A portion of the result report is included below for reference.      CLINICAL CONDITION Lynch syndrome is characterized by an increased risk of colorectal cancer as well as cancers of the endometrium, ovary, prostate, stomach, small intestine, hepatobiliary tract, urinary tract, pancreas and brain (PMID: 6644034, 74259563, 87564332). Lynch syndrome tumors typically demonstrate microsatellite instability (MSI) as well as loss of expression of the mismatch repair proteins on immunohistochemical (IHC) staining. This condition is caused by pathogenic variants in one of the mismatch repair genes-MLH1, MSH2, MSH6, PMS2-as well as deletions in the 3' end of the EPCAM gene.  RISKS   MANAGEMENT: Surveillance guidelines, as published by the [employer]  (NCCN), suggest the following for individuals with Lynch syndrome (*NCCN. Genetic/Familial High-Risk Assessment: Colorectal. Version 2.2016):  Colon cancer:   - Colonoscopy every 1-2 years starting at 52-6 years of age, or 2-5 years prior to the earliest colon cancer if diagnosed before age 64. Gynecological cancer:   - Prophylactic hysterectomy and bilateral salpingo-oophorectomy (BSO) may be considered by women after childbearing is complete.  - Education regarding prompt evaluation for dysfunctional uterine bleeding  There is no clear evidence to support routine endometrial cancer screening, nor do the data support ovarian cancer screening; however annual office endometrial sampling is an option. Serum CA-125 for ovarian cancer and transvaginal ultrasound for ovarian and endometrial cancer may be considered at the clinician's  discretion. These modalities have not been shown to be sufficiently sensitive or specific to support a positive recommendation.  Other extra-colonic cancers  - While there is no clear evidence to support screening for gastric, duodenal, and small bowel cancer, select individuals, families, or those of Asian descent may  consider esophagogastroduodenoscopy (EGD) with extended duodenoscopy every 3-5 years beginning at age 24-1 years of age.  - Consider testing for and treating H. pylori.  - Consider annual urinalysis beginning at 60-26 years of age to screen for urothelial cancer.  - Consider annual physical/neurologic examination starting at 30-66 years of age.  NCCN states that no screening recommendation can be made for pancreatic cancer at this time (*NCCN. Genetic/Familial High-Risk Assessment: Colorectal. Version 2.2016). In contrast, the [employer] of Gastroenterology Clinical Guidelines recommend pancreatic cancer screening for individuals with Lynch syndrome with a first- or second-degree relative affected with pancreatic cancer (PMID: 95188416, 60630160). Ideally, screening should be performed in experienced centers utilizing a multidisciplinary approach under research conditions. Recommended screening includes annual endoscopic ultrasound and/or MRI of the pancreas starting at age 33 or 37 years younger than the earliest age of pancreatic cancer diagnosis in the family (PMID: 10932355).  INHERITANCE Lynch syndrome has autosomal dominant inheritance. This means that an individual with a pathogenic variant has a 50% chance of passing the condition on to their children.   Additionally, individuals with a pathogenic variant in one of the MMR genes (MLH1, MSH2, MSH6, PMS2) are carriers of constitutional mismatch repair deficiency (CMMR-D). CMMR-D is a childhood-onset cancer predisposition  syndrome that can present with hematological malignancies, cancers of the brain and central nervous  system, Lynch syndrome-associated cancers (colon, uterine, small bowel, urinary tract), embryonic tumors, and sarcomas. Some affected individuals may also display some features of neurofibromatosis type 1-most often cafe-au-lait macules (PMID: 53664403, 47425956). For there to be a risk of CMMR-D in offspring, an individual and their partner would each have to have a single pathogenic variant in the same MMR gene; in such a case, the risk of having an affected child is 25%.  FAMILY MEMBERS: Since we now know the mutation in [name], we can test at-risk relatives to determine whether or not they have inherited the mutation and are at increased risk for cancer.  It is important that all of Ms. Alston's relatives (both men and women) know of the presence of this gene mutation. Genetic testing of additional family members can sort out who in the family is at risk and who is not. We will be happy to meet with any of the family members or refer them to a genetic counselor in their local area. To locate genetic counselors in other cities, individuals can visit the website of the [employer] ([employer]ist[employer].se) and Secretary/administrator for a Social worker by zip code.   Ms. Sherene Sires son and sisters each have a 50% chance to have this mutation. We recommend they have genetic testing for this same mutation, as identifying the presence of this mutation would allow them to also take advantage of risk-reducing measures. Until it can be determined which side of Ms. Alston's family carries the MSH6 mutation, it is recommended she inform both maternal and paternal extended relatives (aunts, uncles, cousins) of their risk to carry this mutation.  Children who inherit two mutations in the same Lynch gene, one mutation from each parent, are at-risk of a rare recessive condition called constitutional mismatch repair deficiency (CMMR-D) syndrome. If family members have this mutation, they may wish to have their partner  tested if they are planning on having children.  PLAN:  1)  [name] was advised to discuss the risks and management recommendations associated with Lynch syndrome with Dr. Lindi Adie. The relevance of these recommendations must be considered within the context of her diagnosis of metastatic breast cancer and current prognosis. [name] was also advised to consult Dr. Lindi Adie regarding how her germline genetic testing results may or may not inform her therapy options for her metastatic breast cancer. Though the plan is for [name] to return to Dr. Lindi Adie for ongoing management of her metastatic breast cancer and Lynch syndrome, she was encouraged to contact me if I can assist her in any way in the future. 2) [name] requested that I call her sister, Ilona Sorrel, to review the above information. I left a message for [name] requesting a return call on 12/10/2016. 3) [name] plans to inform her sisters and her son of these results and encourage them to seek genetic counseling and testing from either the [facility] or from their own health care providers or other genetic counselors. [name] was informed of [facility]'s Family Variant Testing Program which will test her sisters and son for free if testing is performed within 90 days of her results (results dated for 12/09/2016). 4) [name] gave verbal permission for [place] to discuss her genetic counseling records and genetic testing results with any of her sisters and her son if they contact us. 5) [name] requested that we schedule  an appointment for her son, Darcella Gasman DOB 01/31/1998 for 12/23/2016 at 10 AM.  We encouraged [name] to remain in contact with [place] in cancer genetics on an annual basis so we can update Ms. Alston's personal and family histories, and inform her of advances in cancer genetics that may be of benefit for the entire family. Our contact number was provided. Ms. Sherene Sires questions were answered to  her satisfaction, and she knows she is welcome to call us at anytime with additional questions or concerns.   Mal Misty, MS, [facility] Certified Genetic Counselor [email or web address] [phone]

## 2016-12-21 ENCOUNTER — Ambulatory Visit: Payer: Self-pay | Admitting: General Surgery

## 2016-12-22 ENCOUNTER — Encounter (HOSPITAL_COMMUNITY): Payer: Self-pay

## 2016-12-22 ENCOUNTER — Encounter (HOSPITAL_COMMUNITY): Payer: Self-pay | Admitting: *Deleted

## 2016-12-22 NOTE — Progress Notes (Signed)
Spoke with pt for pre-op call. Pt denies cardiac history, chest pain or sob. Pt states she is not diabetic.  

## 2016-12-24 ENCOUNTER — Ambulatory Visit (HOSPITAL_COMMUNITY): Payer: BLUE CROSS/BLUE SHIELD | Admitting: Certified Registered"

## 2016-12-24 ENCOUNTER — Ambulatory Visit (HOSPITAL_COMMUNITY)
Admission: RE | Admit: 2016-12-24 | Discharge: 2016-12-24 | Disposition: A | Discharge status: Home / Self Care | Payer: BLUE CROSS/BLUE SHIELD | Source: Ambulatory Visit | Attending: General Surgery | Admitting: General Surgery

## 2016-12-24 ENCOUNTER — Encounter (HOSPITAL_COMMUNITY)
Admission: RE | Disposition: A | Discharge status: Home / Self Care | Payer: Self-pay | Source: Ambulatory Visit | Attending: General Surgery

## 2016-12-24 ENCOUNTER — Encounter (HOSPITAL_COMMUNITY): Payer: Self-pay | Admitting: *Deleted

## 2016-12-24 DIAGNOSIS — C50911 Malignant neoplasm of unspecified site of right female breast: Secondary | ICD-10-CM | POA: Diagnosis not present

## 2016-12-24 DIAGNOSIS — C50919 Malignant neoplasm of unspecified site of unspecified female breast: Secondary | ICD-10-CM | POA: Diagnosis present

## 2016-12-24 DIAGNOSIS — I10 Essential (primary) hypertension: Secondary | ICD-10-CM | POA: Insufficient documentation

## 2016-12-24 DIAGNOSIS — Z9221 Personal history of antineoplastic chemotherapy: Secondary | ICD-10-CM | POA: Insufficient documentation

## 2016-12-24 DIAGNOSIS — C7989 Secondary malignant neoplasm of other specified sites: Secondary | ICD-10-CM | POA: Insufficient documentation

## 2016-12-24 DIAGNOSIS — C7931 Secondary malignant neoplasm of brain: Secondary | ICD-10-CM | POA: Insufficient documentation

## 2016-12-24 DIAGNOSIS — Z171 Estrogen receptor negative status [ER-]: Secondary | ICD-10-CM | POA: Diagnosis not present

## 2016-12-24 DIAGNOSIS — Z79899 Other long term (current) drug therapy: Secondary | ICD-10-CM | POA: Insufficient documentation

## 2016-12-24 HISTORY — DX: Headache, unspecified: R51.9

## 2016-12-24 HISTORY — DX: Headache: R51

## 2016-12-24 HISTORY — PX: BIOPSY OF SKIN SUBCUTANEOUS TISSUE AND/OR MUCOUS MEMBRANE: SHX6741

## 2016-12-24 LAB — CBC
HCT: 41.5 % (ref 36.0–46.0)
Hemoglobin: 13.7 g/dL (ref 12.0–15.0)
MCH: 29 pg (ref 26.0–34.0)
MCHC: 33 g/dL (ref 30.0–36.0)
MCV: 87.7 fL (ref 78.0–100.0)
PLATELETS: 174 10*3/uL (ref 150–400)
RBC: 4.73 MIL/uL (ref 3.87–5.11)
RDW: 11.9 % (ref 11.5–15.5)
WBC: 4.9 10*3/uL (ref 4.0–10.5)

## 2016-12-24 LAB — BASIC METABOLIC PANEL
Anion gap: 7 (ref 5–15)
BUN: 9 mg/dL (ref 6–20)
CALCIUM: 9.1 mg/dL (ref 8.9–10.3)
CHLORIDE: 105 mmol/L (ref 101–111)
CO2: 25 mmol/L (ref 22–32)
CREATININE: 0.61 mg/dL (ref 0.44–1.00)
GFR calc Af Amer: >60 mL/min (ref >60)
GFR calc non Af Amer: >60 mL/min (ref >60)
Glucose, Bld: 102 mg/dL — ABNORMAL HIGH (ref 65–99)
Potassium: 3.8 mmol/L (ref 3.5–5.1)
SODIUM: 137 mmol/L (ref 135–145)

## 2016-12-24 LAB — HCG, SERUM, QUALITATIVE: PREG SERUM: NEGATIVE

## 2016-12-24 SURGERY — BIOPSY, SKIN, SUBCUTANEOUS TISSUE, OR MUCOUS MEMBRANE
Anesthesia: General | Site: Back | Laterality: Left

## 2016-12-24 MED ORDER — CHLORHEXIDINE GLUCONATE CLOTH 2 % EX PADS: 6.0000 | MEDICATED_PAD | Freq: Once | CUTANEOUS | Status: DC

## 2016-12-24 MED ORDER — LACTATED RINGERS IV SOLN
INTRAVENOUS | Status: DC | PRN
  Administered 2016-12-24: 07:00:00 via INTRAVENOUS

## 2016-12-24 MED ORDER — ROCURONIUM BROMIDE 100 MG/10ML IV SOLN
INTRAVENOUS | Status: DC | PRN
  Administered 2016-12-24: 50 mg via INTRAVENOUS

## 2016-12-24 MED ORDER — SUGAMMADEX SODIUM 200 MG/2ML IV SOLN
INTRAVENOUS | Status: AC
  Filled 2016-12-24: qty 2

## 2016-12-24 MED ORDER — ONDANSETRON HCL 4 MG/2ML IJ SOLN
INTRAMUSCULAR | Status: DC | PRN
  Administered 2016-12-24: 4 mg via INTRAVENOUS

## 2016-12-24 MED ORDER — GABAPENTIN 300 MG PO CAPS
300.0000 mg | ORAL_CAPSULE | ORAL | Status: AC
  Administered 2016-12-24: 300 mg via ORAL
  Filled 2016-12-24: qty 1

## 2016-12-24 MED ORDER — HYDROCODONE-ACETAMINOPHEN 5-325 MG PO TABS: 1.0000 | ORAL_TABLET | ORAL | 0 refills | Status: DC | PRN

## 2016-12-24 MED ORDER — SUGAMMADEX SODIUM 200 MG/2ML IV SOLN
INTRAVENOUS | Status: DC | PRN
  Administered 2016-12-24: 317.6 mg via INTRAVENOUS

## 2016-12-24 MED ORDER — FENTANYL CITRATE (PF) 250 MCG/5ML IJ SOLN
INTRAMUSCULAR | Status: AC
  Filled 2016-12-24: qty 5

## 2016-12-24 MED ORDER — PROPOFOL 10 MG/ML IV BOLUS
INTRAVENOUS | Status: AC
  Filled 2016-12-24: qty 20

## 2016-12-24 MED ORDER — 0.9 % SODIUM CHLORIDE (POUR BTL) OPTIME
TOPICAL | Status: DC | PRN
Start: 2016-12-24 — End: 2016-12-24
  Administered 2016-12-24: 1000 mL

## 2016-12-24 MED ORDER — BUPIVACAINE-EPINEPHRINE (PF) 0.25% -1:200000 IJ SOLN
INTRAMUSCULAR | Status: AC
  Filled 2016-12-24: qty 30

## 2016-12-24 MED ORDER — DEXAMETHASONE SODIUM PHOSPHATE 10 MG/ML IJ SOLN
INTRAMUSCULAR | Status: DC | PRN
  Administered 2016-12-24: 6 mg via INTRAVENOUS

## 2016-12-24 MED ORDER — PHENYLEPHRINE 40 MCG/ML (10ML) SYRINGE FOR IV PUSH (FOR BLOOD PRESSURE SUPPORT)
PREFILLED_SYRINGE | INTRAVENOUS | Status: AC
  Filled 2016-12-24: qty 10

## 2016-12-24 MED ORDER — FENTANYL CITRATE (PF) 100 MCG/2ML IJ SOLN: 25.0000 ug | INTRAMUSCULAR | Status: DC | PRN

## 2016-12-24 MED ORDER — CEFAZOLIN SODIUM-DEXTROSE 2-4 GM/100ML-% IV SOLN
2.0000 g | INTRAVENOUS | Status: AC
  Administered 2016-12-24: 2 g via INTRAVENOUS
  Filled 2016-12-24: qty 100

## 2016-12-24 MED ORDER — DEXAMETHASONE SODIUM PHOSPHATE 10 MG/ML IJ SOLN
INTRAMUSCULAR | Status: AC
  Filled 2016-12-24: qty 1

## 2016-12-24 MED ORDER — MIDAZOLAM HCL 5 MG/5ML IJ SOLN
INTRAMUSCULAR | Status: DC | PRN
  Administered 2016-12-24: 2 mg via INTRAVENOUS

## 2016-12-24 MED ORDER — LIDOCAINE HCL (CARDIAC) 20 MG/ML IV SOLN
INTRAVENOUS | Status: DC | PRN
  Administered 2016-12-24: 60 mg via INTRAVENOUS

## 2016-12-24 MED ORDER — CELECOXIB 200 MG PO CAPS
400.0000 mg | ORAL_CAPSULE | ORAL | Status: AC
  Administered 2016-12-24: 400 mg via ORAL
  Filled 2016-12-24: qty 2

## 2016-12-24 MED ORDER — PROPOFOL 10 MG/ML IV BOLUS
INTRAVENOUS | Status: DC | PRN
  Administered 2016-12-24: 180 mg via INTRAVENOUS

## 2016-12-24 MED ORDER — MIDAZOLAM HCL 2 MG/2ML IJ SOLN
INTRAMUSCULAR | Status: AC
  Filled 2016-12-24: qty 2

## 2016-12-24 MED ORDER — ONDANSETRON HCL 4 MG/2ML IJ SOLN
INTRAMUSCULAR | Status: AC
  Filled 2016-12-24: qty 2

## 2016-12-24 MED ORDER — ROCURONIUM BROMIDE 10 MG/ML (PF) SYRINGE
PREFILLED_SYRINGE | INTRAVENOUS | Status: AC
  Filled 2016-12-24: qty 5

## 2016-12-24 MED ORDER — FENTANYL CITRATE (PF) 100 MCG/2ML IJ SOLN
INTRAMUSCULAR | Status: DC | PRN
  Administered 2016-12-24 (×3): 50 ug via INTRAVENOUS

## 2016-12-24 MED ORDER — OXYCODONE HCL 5 MG PO TABS: 5.0000 mg | ORAL_TABLET | Freq: Once | ORAL | Status: DC | PRN

## 2016-12-24 MED ORDER — ONDANSETRON HCL 4 MG/2ML IJ SOLN: 4.0000 mg | Freq: Once | INTRAMUSCULAR | Status: DC | PRN

## 2016-12-24 MED ORDER — OXYCODONE HCL 5 MG/5ML PO SOLN: 5.0000 mg | Freq: Once | ORAL | Status: DC | PRN

## 2016-12-24 MED ORDER — BUPIVACAINE-EPINEPHRINE 0.25% -1:200000 IJ SOLN
INTRAMUSCULAR | Status: DC | PRN
  Administered 2016-12-24: 30 mL

## 2016-12-24 MED ORDER — ACETAMINOPHEN 500 MG PO TABS
1000.0000 mg | ORAL_TABLET | ORAL | Status: AC
  Administered 2016-12-24: 1000 mg via ORAL
  Filled 2016-12-24: qty 2

## 2016-12-24 SURGICAL SUPPLY — 31 items
BLADE SURG 15 STRL LF DISP TIS (BLADE) ×1 IMPLANT
BLADE SURG 15 STRL SS (BLADE) ×1
CHLORAPREP W/TINT 10.5 ML (MISCELLANEOUS) ×2 IMPLANT
CONT SPEC 4OZ CLIKSEAL STRL BL (MISCELLANEOUS) ×2 IMPLANT
COVER SURGICAL LIGHT HANDLE (MISCELLANEOUS) ×2 IMPLANT
DERMABOND ADVANCED (GAUZE/BANDAGES/DRESSINGS) ×1
DERMABOND ADVANCED .7 DNX12 (GAUZE/BANDAGES/DRESSINGS) ×1 IMPLANT
DRAPE LAPAROTOMY 100X72 PEDS (DRAPES) IMPLANT
DRAPE UTILITY XL STRL (DRAPES) ×2 IMPLANT
DRSG TELFA 3X8 NADH (GAUZE/BANDAGES/DRESSINGS) IMPLANT
ELECT CAUTERY BLADE 6.4 (BLADE) ×2 IMPLANT
ELECT REM PT RETURN 9FT ADLT (ELECTROSURGICAL) ×2
ELECTRODE REM PT RTRN 9FT ADLT (ELECTROSURGICAL) ×1 IMPLANT
GAUZE SPONGE 4X4 16PLY XRAY LF (GAUZE/BANDAGES/DRESSINGS) ×2 IMPLANT
GLOVE BIO SURGEON STRL SZ7.5 (GLOVE) ×2 IMPLANT
GOWN STRL REUS W/ TWL LRG LVL3 (GOWN DISPOSABLE) ×2 IMPLANT
GOWN STRL REUS W/TWL LRG LVL3 (GOWN DISPOSABLE) ×2
KIT BASIN OR (CUSTOM PROCEDURE TRAY) ×2 IMPLANT
KIT ROOM TURNOVER OR (KITS) ×2 IMPLANT
NEEDLE HYPO 25GX1X1/2 BEV (NEEDLE) ×2 IMPLANT
NS IRRIG 1000ML POUR BTL (IV SOLUTION) ×2 IMPLANT
PACK SURGICAL SETUP 50X90 (CUSTOM PROCEDURE TRAY) ×2 IMPLANT
PAD ARMBOARD 7.5X6 YLW CONV (MISCELLANEOUS) ×2 IMPLANT
PENCIL BUTTON HOLSTER BLD 10FT (ELECTRODE) ×2 IMPLANT
SUT MNCRL AB 4-0 PS2 18 (SUTURE) ×4 IMPLANT
SUT VIC AB 3-0 SH 18 (SUTURE) ×2 IMPLANT
SUT VIC AB 3-0 SH 27 (SUTURE) ×1
SUT VIC AB 3-0 SH 27X BRD (SUTURE) ×1 IMPLANT
SYR CONTROL 10ML LL (SYRINGE) ×2 IMPLANT
TOWEL OR 17X24 6PK STRL BLUE (TOWEL DISPOSABLE) ×2 IMPLANT
TOWEL OR 17X26 10 PK STRL BLUE (TOWEL DISPOSABLE) ×2 IMPLANT

## 2016-12-24 NOTE — Interval H&P Note (Signed)
History and Physical Interval Note:  12/24/2016 7:22 AM  Kaitlyn Keith  has presented today for surgery, with the diagnosis of RECURRENT BREAST CANCER  The various methods of treatment have been discussed with the patient and family. After consideration of risks, benefits and other options for treatment, the patient has consented to  Procedure(s): OPEN BIOPSY LESIONS ON LEFT LOWER BACK AND SHOULDER BLADE (Left) as a surgical intervention .  The patient's history has been reviewed, patient examined, no change in status, stable for surgery.  I have reviewed the patient's chart and labs.  Questions were answered to the patient's satisfaction.     TOTH III,PAUL S

## 2016-12-24 NOTE — Op Note (Signed)
12/24/2016  8:34 AM  PATIENT:  Kaitlyn Keith  51 y.o. female  PRE-OPERATIVE DIAGNOSIS:  RECURRENT BREAST CANCER  POST-OPERATIVE DIAGNOSIS:  RECURRENT BREAST CANCER  PROCEDURE:  Procedure(s): OPEN BIOPSY LESIONS ON LEFT LOWER BACK AND LEFT MID BACK  SURGEON:  Surgeon(s) and Role:    * [name], MD - Primary  PHYSICIAN ASSISTANT:   ASSISTANTS: none   ANESTHESIA:   local and general  EBL:  Total I/O In: 500 [I.V.:500] Out: -   BLOOD ADMINISTERED:none  DRAINS: none   LOCAL MEDICATIONS USED:  MARCAINE     SPECIMEN:  Source of Specimen:  lesions left mid and low back  DISPOSITION OF SPECIMEN:  PATHOLOGY  COUNTS:  YES  TOURNIQUET:  * No tourniquets in log *  DICTATION: .Dragon Dictation   After informed consent was obtained the patient was brought to the operating room and left in the supine position on the stretcher. After adequate induction of general anesthesia the patient was moved into a prone position on the operating room table and all pressure points are padded. An appropriate timeout was performed. The patient had subcutaneous nodules on the left mid and low back that were FDG avid on PET scan. There is concern that the use or manifestations of recurrent breast cancer. Each of these areas was infiltrated with quarter percent Marcaine. A small transversely oriented incision was made overlying each palpable mass with a 15 blade knife. The incision was then carried through the skin and subcutaneous tissue sharply with the electrocautery. Each mass seemed to be isolated in the subcutaneous tissue. The mass at each location was completely excised sharply with the electrocautery. Each mass was then sent to pathology for further evaluation. Hemostasis was achieved using the Bovie electrocautery. Each wound was then infiltrated with more quarter percent Marcaine. Both masses measured approximately 1 cm each. The deep layer of each wound was closed with an interrupted  3-0 Vicryl stitch. The skin incisions were then closed with interrupted 4-0 Monocryl subcuticular stitches. Dermabond dressings were applied. The patient tolerated the procedure well. At the end of the case all needle sponge and instrument counts were correct. The patient was then awakened and taken to recovery in stable condition.  PLAN OF CARE: Discharge to home after PACU  PATIENT DISPOSITION:  PACU - hemodynamically stable.   Delay start of Pharmacological VTE agent (>24hrs) due to surgical blood loss or risk of bleeding: not applicable

## 2016-12-24 NOTE — Transfer of Care (Signed)
Immediate Anesthesia Transfer of Care Note  Patient: Kaitlyn Keith  Procedure(s) Performed: Procedure(s): OPEN BIOPSY LESIONS ON LEFT LOWER BACK AND SHOULDER BLADE (Left)  Patient Location: PACU  Anesthesia Type:General  Level of Consciousness: awake, alert  and oriented  Airway & Oxygen Therapy: Patient connected to face mask oxygen  Post-op Assessment: Post -op Vital signs reviewed and stable  Post vital signs: stable  Last Vitals:  Vitals:   12/24/16 0617  BP: (!) 155/98  Pulse: (!) 58  Resp: 20  Temp: 36.8 C    Last Pain:  Vitals:   12/24/16 0617  TempSrc: Oral         Complications: No apparent anesthesia complications

## 2016-12-24 NOTE — H&P (Signed)
Kaitlyn Keith  Location: [facility] Patient #: [id number] DOB: 04-01-1966 Divorced / Language: English / Race: Black or African American Female   History of Present Illness  The patient is a 51 year old female who presents for a follow-up for Breast cancer. The patient is a 51 year old black female who is almost 1-1/2 years status post right lumpectomy and sentinel node mapping for a T2 N0 right breast cancer. She presented as a triple negative cancer. She received neoadjuvant chemotherapy and had a complete pathologic response. In February of this year she developed a metastatic lesion in her brain that was resected. She has also developed some subcutaneous and intraperitoneal nodules that have some activity on the PET scan. The oncologist would like at least one of these nodules biopsied.   Allergies  Allergies Reconciled   Medication History Lisinopril-Hydrochlorothiazide (Oral) Specific strength unknown - Active. Medications Reconciled    Review of Systems  General Present- Fatigue and Night Sweats. Not Present- Appetite Loss, Chills, Fever, Weight Gain and Weight Loss. HEENT Not Present- Earache, Hearing Loss, Hoarseness, Nose Bleed, Oral Ulcers, Ringing in the Ears, Seasonal Allergies, Sinus Pain, Sore Throat, Visual Disturbances, Wears glasses/contact lenses and Yellow Eyes. Respiratory Not Present- Bloody sputum, Chronic Cough, Difficulty Breathing, Snoring and Wheezing. Breast Not Present- Breast Mass, Breast Pain, Nipple Discharge and Skin Changes. Cardiovascular Not Present- Chest Pain, Difficulty Breathing Lying Down, Leg Cramps, Palpitations, Rapid Heart Rate, Shortness of Breath and Swelling of Extremities. Gastrointestinal Not Present- Abdominal Pain, Bloating, Bloody Stool, Change in Bowel Habits, Chronic diarrhea, Constipation, Difficulty Swallowing, Excessive gas, Gets full quickly at meals, Hemorrhoids, Indigestion, Nausea, Rectal Pain and  Vomiting. Musculoskeletal Present- Joint Pain, Joint Stiffness and Muscle Pain. Not Present- Back Pain, Muscle Weakness and Swelling of Extremities. Neurological Present- Numbness and Tingling. Not Present- Decreased Memory, Fainting, Headaches, Seizures, Tremor, Trouble walking and Weakness. Psychiatric Not Present- Anxiety, Bipolar, Change in Sleep Pattern, Depression, Fearful and Frequent crying. Endocrine Not Present- Cold Intolerance, Excessive Hunger, Hair Changes, Heat Intolerance, Hot flashes and New Diabetes. Hematology Not Present- Easy Bruising, Excessive bleeding, Gland problems, HIV and Persistent Infections.  Vitals Weight: 175 lb Height: 63in Body Surface Area: 1.83 m Body Mass Index: 31 kg/m  Temp.: 98.42F  Pulse: 77 (Regular)  BP: 138/100 (Sitting, Left Arm, Standard)       Physical Exam General Mental Status-Alert. General Appearance-Consistent with stated age. Hydration-Well hydrated. Voice-Normal.  Head and Neck Head-normocephalic, atraumatic with no lesions or palpable masses. Trachea-midline. Thyroid Gland Characteristics - normal size and consistency.  Eye Eyeball - Bilateral-Extraocular movements intact. Sclera/Conjunctiva - Bilateral-No scleral icterus.  Chest and Lung Exam Chest and lung exam reveals -quiet, even and easy respiratory effort with no use of accessory muscles and on auscultation, normal breath sounds, no adventitious sounds and normal vocal resonance. Inspection Chest Wall - Normal. Back - normal.  Breast Note: There is no palpable mass in either breast. There is no palpable axillary, supraclavicular, or cervical lymphadenopathy. There doesn't feel to be some firmness posteriorly in the right axilla in an area that seems to overlie the latissimus muscle. this could be scar from her previous surgery.   Cardiovascular Cardiovascular examination reveals -normal heart sounds, regular rate and rhythm  with no murmurs and normal pedal pulses bilaterally.  Abdomen Inspection Inspection of the abdomen reveals - No Hernias. Skin - Scar - no surgical scars. Palpation/Percussion Palpation and Percussion of the abdomen reveal - Soft, Non Tender, No Rebound tenderness, No Rigidity (guarding) and No  hepatosplenomegaly. Auscultation Auscultation of the abdomen reveals - Bowel sounds normal. Note: There are no palpable masses   Neurologic Neurologic evaluation reveals -alert and oriented x 3 with no impairment of recent or remote memory. Mental Status-Normal.  Musculoskeletal Normal Exam - Left-Upper Extremity Strength Normal and Lower Extremity Strength Normal. Normal Exam - Right-Upper Extremity Strength Normal and Lower Extremity Strength Normal. Note: There is a 1 cm palpable nodule that seems to be isolated in the subcutaneous tissue of the left low back.   Lymphatic Head & Neck  General Head & Neck Lymphatics: Bilateral - Description - Normal. Axillary  General Axillary Region: Bilateral - Description - Normal. Tenderness - Non Tender. Femoral & Inguinal  Generalized Femoral & Inguinal Lymphatics: Bilateral - Description - Normal. Tenderness - Non Tender.    Assessment & Plan RECURRENT BREAST CANCER, RIGHT (C50.911) Impression: The patient appears to have developed metastatic breast cancer to the brain. She also has some intra-abdominal and subcutaneous nodules that light up on her PET scan. The only palpable nodule is on the left low back. I think this could be readily biopsied in an open fashion. She would also like to consider having the intra-abdominal nodules biopsied. I will review the scans with the radiologist to see if anything would be readily reachable and if so we could plan for a laparoscopic exploration and possible biopsy. I have discussed with her in detail the risks and benefits of the operation as well as some of the technical aspects and she understands  and wishes to proceed.

## 2016-12-24 NOTE — Anesthesia Procedure Notes (Signed)
Procedure Name: Intubation Date/Time: 12/24/2016 9:42 AM Performed by: Lavell Luster Pre-anesthesia Checklist: Patient identified, Emergency Drugs available, Suction available, Patient being monitored and Timeout performed Patient Re-evaluated:Patient Re-evaluated prior to induction Oxygen Delivery Method: Circle system utilized Preoxygenation: Pre-oxygenation with 100% oxygen Induction Type: IV induction Ventilation: Mask ventilation without difficulty Laryngoscope Size: Mac and 3 Grade View: Grade I Tube type: Oral Tube size: 7.5 mm Number of attempts: 1 Airway Equipment and Method: Stylet Placement Confirmation: ETT inserted through vocal cords under direct vision,  positive ETCO2 and breath sounds checked- equal and bilateral Secured at: 22 cm Tube secured with: Tape Dental Injury: Teeth and Oropharynx as per pre-operative assessment

## 2016-12-24 NOTE — Anesthesia Preprocedure Evaluation (Addendum)
Anesthesia Evaluation  Patient identified by MRN, date of birth, ID band Patient awake    Reviewed: Allergy & Precautions, NPO status , Patient's Chart, lab work & pertinent test results  Airway Mallampati: II  TM Distance: >3 FB Neck ROM: Full    Dental  (+) Teeth Intact, Dental Advisory Given   Pulmonary    breath sounds clear to auscultation       Cardiovascular hypertension,  Rhythm:Regular Rate:Normal     Neuro/Psych    GI/Hepatic   Endo/Other    Renal/GU      Musculoskeletal   Abdominal (+) + obese,   Peds  Hematology   Anesthesia Other Findings   Reproductive/Obstetrics                             Anesthesia Physical Anesthesia Plan  ASA: III  Anesthesia Plan: General   Post-op Pain Management:    Induction: Intravenous  PONV Risk Score and Plan: Ondansetron and Dexamethasone  Airway Management Planned: Oral ETT  Additional Equipment:   Intra-op Plan:   Post-operative Plan: Extubation in OR  Informed Consent: I have reviewed the patients History and Physical, chart, labs and discussed the procedure including the risks, benefits and alternatives for the proposed anesthesia with the patient or authorized representative who has indicated his/her understanding and acceptance.     Dental advisory given  Plan Discussed with: CRNA and Anesthesiologist  Anesthesia Plan Comments:         Anesthesia Quick Evaluation  

## 2016-12-24 NOTE — Anesthesia Postprocedure Evaluation (Signed)
Anesthesia Post Note  Patient: Kaitlyn Keith  Procedure(s) Performed: Procedure(s) (LRB): OPEN BIOPSY LESIONS ON LEFT LOWER BACK AND SHOULDER BLADE (Left)     Anesthesia Post Evaluation  Last Vitals:  Vitals:   12/24/16 0849 12/24/16 0909  BP: (!) 131/91 (!) 138/91  Pulse: 71 75  Resp: (!) 21 16  Temp:      Last Pain:  Vitals:   12/24/16 0909  TempSrc:   PainSc: 2                  Shawnay Bramel COKER

## 2016-12-25 ENCOUNTER — Encounter (HOSPITAL_COMMUNITY): Payer: Self-pay | Admitting: General Surgery

## 2016-12-31 ENCOUNTER — Ambulatory Visit (HOSPITAL_BASED_OUTPATIENT_CLINIC_OR_DEPARTMENT_OTHER): Payer: BLUE CROSS/BLUE SHIELD | Admitting: Hematology and Oncology

## 2016-12-31 ENCOUNTER — Encounter: Payer: Self-pay | Admitting: Hematology and Oncology

## 2016-12-31 ENCOUNTER — Telehealth: Payer: Self-pay | Admitting: Pharmacist

## 2016-12-31 DIAGNOSIS — C7931 Secondary malignant neoplasm of brain: Secondary | ICD-10-CM

## 2016-12-31 DIAGNOSIS — C50511 Malignant neoplasm of lower-outer quadrant of right female breast: Secondary | ICD-10-CM | POA: Diagnosis not present

## 2016-12-31 DIAGNOSIS — C792 Secondary malignant neoplasm of skin: Secondary | ICD-10-CM

## 2016-12-31 DIAGNOSIS — Z171 Estrogen receptor negative status [ER-]: Secondary | ICD-10-CM

## 2016-12-31 MED ORDER — DEXAMETHASONE 0.5 MG/5ML PO SOLN: ORAL | 3 refills | Status: DC

## 2016-12-31 MED ORDER — EVEROLIMUS 7.5 MG PO TABS: 7.5000 mg | ORAL_TABLET | Freq: Every day | ORAL | 3 refills | Status: DC

## 2016-12-31 NOTE — Telephone Encounter (Signed)
Oral Oncology Pharmacist Encounter  Received new prescription for Afinitor for the treatment of metastatic breast cancer, planned duration until disease progression or unacceptable toxicity.  Labs in [place] assessed, [place] for treatment.  Current medication list in Epic reviewed, no significant DDIs with Afinitor identified.  Prescription has been e-scribed to the [employer] for benefits analysis and approval. Noted patient with commercial [employer], may require specific specialty pharmacy per insurance requirement.  Dispensed samples to patient:  Medication: Afinitor 10mg  tablets Instructions: Take 1 tablet by mouth once daily Quantity dispensed: 14 Days supply: 14 Manufacturer: [employer] Lot: [id number] Exp: Jun 2019  I spoke with patient for overview of new oral chemotherapy medication: Afinitor.   Counseled patient on administration, dosing, side effects, safe handling, and monitoring. Patient will take Afinitor 7.5mg  tablets, 1 tablets by mouth once daily, with or without food. Patient states she will likely take her Afinitor before bedtime each night. Patient understands samples are a higher dose than she will continue taking once prescription processing is complete.  Side effects include but not limited to: GI upset, diarrhea, mouth sores, increased blood sugars, rash, and edema.  Prescription for dexamethasone mouthwash for stomatitis prophylaxis sent to patient's local pharmacy.    Reviewed with patient importance of keeping a medication schedule and plan for any missed doses.  [name] voiced understanding and appreciation.   All questions answered.  Will follow up with patient regarding insurance and pharmacy.   Patient knows to call the office with questions or concerns.   Oral Oncology Clinic will continue to follow.  Thank you,  Johny Drilling, PharmD, BCPS, BCOP 12/31/2016  11:18 AM Oral Oncology Clinic [phone]

## 2016-12-31 NOTE — Assessment & Plan Note (Signed)
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 Neoadj chemo 12/24/14- 04/29/15 AC x 4 foll by Abraxane X 12 Rt Lumpectomy: Path CR 0/2 LN Adj XRT 07/23/15- 09/08/15  Cerebellar mass diagnosed 07/12/2016: Right suboccipital craniotomy for tumor resection with stereotactic navigation 07/29/16: Metastatic poorly differentiated adenocarcinoma with extensive necrosis positive for CK 7, MOC 31, CK 5/6; Neg for Er/PR, GATA-3, GCDFP CDX2, Napsin A and TTF-1  CT CAP: 07/12/2016: No evidence of metastatic disease in chest abdomen or pelvis scattered nonspecific hypodensities within the liver measuring 1 cm stable from 2006  PET/CT scan 11/17/2016:Subcutaneous nodules in the neck, upper back, left arm, abdomen and pelvis,. Toenail and pelvic nodules consistent with metastatic disease, normal size nodules in the left axilla and left retropectoral region  Recommendations: 1. 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 She will need colonoscopies for surveillance. Hysterectomy with BSO should be considered.  Treatment plan: I recommended starting her on Everolimus. I discussed the risks of Everolimus including the risk of mucositis and fatigue as well as elevation of cholesterol and blood sugars as well as side effects of treatment.  I sent a prescription for Everolimus. We will see her back in 2 weeks to assess toxicities of treatment.

## 2016-12-31 NOTE — Progress Notes (Signed)
Patient Care Team: Gwendel Hanson as PCP - General (Cardiology) [name], [name], [name], [name], [name], [name], NP as Nurse Practitioner (Nurse Practitioner)  DIAGNOSIS:  Encounter Diagnosis  Name Primary?  . 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, estrogen receptor negative ([place])     SUMMARY OF ONCOLOGIC HISTORY:   Breast cancer of lower-outer quadrant of right female breast ([place])   12/03/2014 Mammogram    Right breast mass 1.9 cm it o'clock position 8 cm depth from the nipple      12/03/2014 Initial Diagnosis    Right breast biopsy 8:00: Invasive ductal carcinoma, grade 3, ER 0%, PR 0%, Ki-67 90%, HER-2 negative ratio 1.43      12/10/2014 Breast MRI    Right breast lower outer quadrant: 2.3 x 2.4 x 2.4 cm rim-enhancing mass abuts the pectoralis fascia but no enhancement of pectoralis muscle, second focus of artifact?'s second tissue marker clip, no lymph nodes      12/10/2014 Clinical Stage    Stage IIA: T2 N0      12/24/2014 - 04/29/2015 Neo-Adjuvant Chemotherapy    Dose dense Adriamycin and Cytoxan 4 followed by weekly Abraxane 12      05/02/2015 Breast MRI    complete radiologic response      06/16/2015 Surgery    Left Lumpectomy: Complete path Response, 0/2 LN      06/16/2015 Pathologic Stage    ypT0 ypN0      07/23/2015 - 09/05/2015 Radiation Therapy    Adjuvant RT: 50.4 Gy in 28 fractions and a boost of 10 Gy in 5 fractions to total dose of 60.4 Gy      10/24/2015 Survivorship    SCP mailed to patient in lieu of in person visit.      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 identified in BARD 1, BRCA2 and NF1      12/31/2016 Miscellaneous    Everolimus 10 mg daily for cycle 1 if she cannot tolerate will decrease to 7.5 mg daily       CHIEF COMPLIANT: Follow-up to review recent biopsy of the subcutaneous nodules as well as Foundation 1 testing and genetic testing  INTERVAL HISTORY: Kaitlyn Keith is a 51 year old with above-mentioned history of triple negative breast cancer who developed metastatic disease. She had cerebellar mass which was resected and subsequently undergone radiation. PET CT scan subsequently showed subcutaneous nodules which were excised and were biopsy-proven metastatic breast cancer. We sent tissue for Foundation 1 testing as well as  genetics. Genetic testing revealed that she had [place] 6 Lynch syndrome gene. The Foundation 1 testing revealed that she had NF2 mutation which predicts response to Everolimus. She is here today to discuss a treatment plan.  REVIEW OF SYSTEMS:   Constitutional: Denies fevers, chills or abnormal weight loss Eyes: Denies blurriness of vision Ears, nose, mouth, throat, and face: Denies mucositis or sore throat Respiratory: Denies cough, dyspnea or wheezes Cardiovascular: Denies palpitation, chest discomfort Gastrointestinal:  Denies nausea,  heartburn or change in bowel habits Skin: Denies abnormal skin rashes Lymphatics: Denies new lymphadenopathy or easy bruising Neurological:Denies numbness, tingling or new weaknesses Behavioral/Psych: Mood is stable, no new changes  Extremities: No lower extremity edema All other systems were reviewed with the patient and are negative.  I have reviewed the past medical history, past surgical history, social history and family history with the patient and they are unchanged from previous note.  ALLERGIES:  has No Known Allergies.  MEDICATIONS:  Current Outpatient Prescriptions  Medication Sig Dispense Refill  . ALPRAZolam (XANAX) 1 MG tablet Take 2 tablets (2 mg total) by mouth at bedtime. (Patient taking differently: Take 1 mg by mouth 2 (two) times daily as needed for anxiety or sleep. ) 60 tablet 0  . dexamethasone (DECADRON) 0.5 MG/5ML solution Swish with 89m for 218m and then spit, up to 4x daily, for 8 weeks, avoid eating/drinking for 1hr post rinse. Start with Afinitor. 500 mL 3  . DULoxetine (CYMBALTA) 20 MG capsule Take 2 capsules (40 mg total) by mouth daily. 30 capsule 3  . everolimus (AFINITOR) 7.5 MG tablet Take 1 tablet (7.5 mg total) by mouth daily. 30 tablet 3  . HYDROcodone-acetaminophen (NORCO/VICODIN) 5-325 MG tablet Take 1-2 tablets by mouth every 4 (four) hours as needed for moderate pain or severe pain. 10 tablet 0  . lamoTRIgine (LAMICTAL) 100 MG tablet Take 1 tablet by mouth daily.    . [place] Kitchenisinopril-hydrochlorothiazide (PRINZIDE,ZESTORETIC) 20-12.5 MG tablet Take 1 tablet by mouth 2 (two) times daily. 60 tablet 6  . oxyCODONE-acetaminophen (PERCOCET) 7.5-325 MG tablet Take 1-2 tablets by mouth every 4 hours as needed for pain. 60 tablet 0  . traZODone (DESYREL) 100 MG tablet Take 1 tablet by mouth daily.    . Vitamin D, Ergocalciferol, (DRISDOL) 50000 UNITS CAPS capsule Take 1 capsule (50,000 Units total) by mouth every 7 (seven) days. (Patient taking differently: Take  50,000 Units by mouth every 7 (seven) days. mondays) 12 capsule 3   No current facility-administered medications for this visit.     PHYSICAL EXAMINATION: ECOG PERFORMANCE STATUS: 1 - Symptomatic but completely ambulatory  Vitals:   12/31/16 0956  BP: 126/89  Pulse: 80  Resp: 18  Temp: 98.3 F (36.8 C)  SpO2: 100%   Filed Weights   12/31/16 0956  Weight: 172 lb 6.4 oz (78.2 kg)    GENERAL:alert, no distress and comfortable SKIN: skin color, texture, turgor are normal, no rashes or significant lesions EYES: normal, Conjunctiva are pink and non-injected, sclera clear OROPHARYNX:no exudate, no erythema and lips, buccal mucosa, and tongue normal  NECK: supple, thyroid normal size, non-tender, without nodularity LYMPH:  no palpable lymphadenopathy in the cervical, axillary or inguinal LUNGS: clear to auscultation and percussion with normal breathing effort HEART: regular rate & rhythm and no murmurs and no lower extremity edema ABDOMEN:abdomen soft, non-tender and normal bowel sounds MUSCULOSKELETAL:no cyanosis of digits and no clubbing  NEURO: alert & oriented x 3 with fluent speech, no focal motor/sensory deficits EXTREMITIES: No lower  extremity edema  LABORATORY DATA:  I have reviewed the data as listed   Chemistry      Component Value Date/Time   NA 137 12/24/2016 0634   NA 143 11/18/2016 1539   K 3.8 12/24/2016 0634   K 3.4 (L) 11/18/2016 1539   CL 105 12/24/2016 0634   CO2 25 12/24/2016 0634   CO2 28 11/18/2016 1539   BUN 9 12/24/2016 0634   BUN 7.8 11/18/2016 1539   CREATININE 0.61 12/24/2016 0634   CREATININE 0.8 11/18/2016 1539      Component Value Date/Time   CALCIUM 9.1 12/24/2016 0634   CALCIUM 9.4 11/18/2016 1539   ALKPHOS 99 11/18/2016 1539   AST 15 11/18/2016 1539   ALT 11 11/18/2016 1539   BILITOT 0.44 11/18/2016 1539       Lab Results  Component Value Date   WBC 4.9 12/24/2016   HGB 13.7 12/24/2016   HCT 41.5 12/24/2016   MCV 87.7  12/24/2016   PLT 174 12/24/2016   NEUTROABS 3.4 11/18/2016    ASSESSMENT & PLAN:  Breast cancer of lower-outer quadrant of right female breast ([place]) Right breast biopsy 12/03/2014 8:00: Invasive ductal carcinoma, grade 3, ER 0%, PR 0%, 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 Neoadj chemo 12/24/14- 04/29/15 AC x 4 foll by Abraxane X 12 Rt Lumpectomy: Path CR 0/2 LN Adj XRT 07/23/15- 09/08/15  Cerebellar mass diagnosed 07/12/2016: Right suboccipital craniotomy for tumor resection with stereotactic navigation 07/29/16: Metastatic poorly differentiated adenocarcinoma with extensive necrosis positive for CK 7, MOC 31, CK 5/6; Neg for Er/PR, GATA-3, GCDFP CDX2, Napsin A and TTF-1  CT CAP: 07/12/2016: No evidence of metastatic disease in chest abdomen or pelvis scattered nonspecific hypodensities within the liver measuring 1 cm stable from 2006  PET/CT scan 11/17/2016:Subcutaneous nodules in the neck, upper back, left arm, abdomen and pelvis,. Toenail and pelvic nodules consistent with metastatic disease, normal size nodules in the left axilla and left retropectoral region  Recommendations: 1. 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 She will need colonoscopies for surveillance. Hysterectomy with BSO should be considered.  Treatment plan: I recommended starting her on Everolimus. I discussed the risks of Everolimus including the risk of mucositis and fatigue as well as elevation of cholesterol and blood sugars as well  as side effects of treatment.  I sent a prescription for Everolimus. We will see her back in 2 weeks to assess toxicities of treatment.   I spent 25 minutes talking to the patient of which more than half was spent in counseling and coordination of care.  No orders of the defined types were placed in this encounter.  The patient has a good understanding of the overall plan. she agrees with it. she will call with any problems that may develop before the next visit here.   Rulon Eisenmenger, MD 12/31/16

## 2017-01-05 ENCOUNTER — Telehealth: Payer: Self-pay | Admitting: Pharmacy Technician

## 2017-01-05 NOTE — Telephone Encounter (Signed)
Oral Oncology Patient Advocate Encounter  Received notification from [employer] that the request for prior authorization for Afinitor has been denied due to the patient's hormone receptor status being negative.        This determination is currently being appealed.    Oral Oncology Clinic will continue to follow.  Gilmore Laroche, CPhT, [place] Oral Oncology Patient Advocate [phone] 11/15/2016 2:53 PM

## 2017-01-06 ENCOUNTER — Other Ambulatory Visit: Payer: Self-pay | Admitting: Radiation Therapy

## 2017-01-06 DIAGNOSIS — C7931 Secondary malignant neoplasm of brain: Secondary | ICD-10-CM

## 2017-01-06 DIAGNOSIS — C7949 Secondary malignant neoplasm of other parts of nervous system: Principal | ICD-10-CM

## 2017-01-09 NOTE — Telephone Encounter (Signed)
I suspected that it may be denied. We may have to seek compassionate use from the [place] instead [place]

## 2017-01-14 ENCOUNTER — Telehealth: Payer: Self-pay | Admitting: Pharmacist

## 2017-01-14 NOTE — Telephone Encounter (Signed)
Oral Oncology Patient Advocate Encounter  Received notification from [employer] that the prior authorization appeal for Afinitor has been denied due to the patient's hormone receptor statue being negative.    I have begun the application process to obtain compassionate use medication supply from the drug company. I will provide updates for this process in a separate encounter.    Fabio Asa. Melynda Keller, [place] Patient [place] [phone] 01/14/2017 4:07 PM

## 2017-01-14 NOTE — Telephone Encounter (Signed)
Oral Chemotherapy Pharmacist Encounter  Dispensed samples to patient:  Medication: Afintor 10mg  tablets Instructions: Take 1 tablet (10mg ) by mouth once daily, consistently with or without food Quantity dispensed: 14 Days supply: 14 Manufacturer: [employer] Lot: [id number] Exp: 10/2017  Johny Drilling, PharmD, BCPS, BCOP 01/14/2017 [facility] [phone]

## 2017-01-18 ENCOUNTER — Other Ambulatory Visit: Payer: Self-pay | Admitting: Pharmacist

## 2017-01-18 ENCOUNTER — Other Ambulatory Visit: Payer: Self-pay | Admitting: General Surgery

## 2017-01-18 ENCOUNTER — Other Ambulatory Visit: Payer: Self-pay

## 2017-01-18 ENCOUNTER — Telehealth: Payer: Self-pay

## 2017-01-18 DIAGNOSIS — Z171 Estrogen receptor negative status [ER-]: Principal | ICD-10-CM

## 2017-01-18 DIAGNOSIS — Z8589 Personal history of malignant neoplasm of other organs and systems: Secondary | ICD-10-CM

## 2017-01-18 DIAGNOSIS — Z85841 Personal history of malignant neoplasm of brain: Secondary | ICD-10-CM

## 2017-01-18 DIAGNOSIS — C50511 Malignant neoplasm of lower-outer quadrant of right female breast: Secondary | ICD-10-CM

## 2017-01-18 NOTE — Telephone Encounter (Signed)
Called to speak with pt, after getting notified by oncology pharmacist that pt was having symptoms with Affinitor. Pt states that she had experienced some headaches and had broken out in a rash from the chest up. Pt states that her headache resolved after taking her migraine medication. She states that her rash is itchy, but has not taken anything over the counter yet. Suggested some over the counter benadryl or hydrocortisone cream for her rashes. Pt verbalized understanding.  Pt saw Dr.Toth yesterday and would like to come in and see Dr.Gudena to discuss a few concerns regarding her medication and treatment plan. Notified Dr.Gudena of pt request and scheduled pt for an appt with LAB and MD in the afternoon. Pt confirmed appt time/date. No further concerns at this time.

## 2017-01-19 ENCOUNTER — Other Ambulatory Visit: Payer: Self-pay | Admitting: Pharmacist

## 2017-01-19 ENCOUNTER — Telehealth: Payer: Self-pay | Admitting: Pharmacy Technician

## 2017-01-19 ENCOUNTER — Ambulatory Visit (HOSPITAL_BASED_OUTPATIENT_CLINIC_OR_DEPARTMENT_OTHER): Payer: BLUE CROSS/BLUE SHIELD | Admitting: Hematology and Oncology

## 2017-01-19 ENCOUNTER — Other Ambulatory Visit (HOSPITAL_BASED_OUTPATIENT_CLINIC_OR_DEPARTMENT_OTHER): Payer: BLUE CROSS/BLUE SHIELD

## 2017-01-19 DIAGNOSIS — C50511 Malignant neoplasm of lower-outer quadrant of right female breast: Secondary | ICD-10-CM

## 2017-01-19 DIAGNOSIS — C7931 Secondary malignant neoplasm of brain: Secondary | ICD-10-CM

## 2017-01-19 DIAGNOSIS — Z171 Estrogen receptor negative status [ER-]: Secondary | ICD-10-CM

## 2017-01-19 DIAGNOSIS — L271 Localized skin eruption due to drugs and medicaments taken internally: Secondary | ICD-10-CM | POA: Diagnosis not present

## 2017-01-19 LAB — CBC WITH DIFFERENTIAL/PLATELET
BASO%: 0.3 % (ref 0.0–2.0)
Basophils Absolute: 0 10*3/uL (ref 0.0–0.1)
EOS ABS: 0 10*3/uL (ref 0.0–0.5)
EOS%: 1.9 % (ref 0.0–7.0)
HCT: 40.6 % (ref 34.8–46.6)
HEMOGLOBIN: 13.7 g/dL (ref 11.6–15.9)
LYMPH%: 36.8 % (ref 14.0–49.7)
MCH: 28.5 pg (ref 25.1–34.0)
MCHC: 33.6 g/dL (ref 31.5–36.0)
MCV: 84.9 fL (ref 79.5–101.0)
MONO#: 0.4 10*3/uL (ref 0.1–0.9)
MONO%: 14.7 % — ABNORMAL HIGH (ref 0.0–14.0)
NEUT%: 46.3 % (ref 38.4–76.8)
NEUTROS ABS: 1.2 10*3/uL — AB (ref 1.5–6.5)
PLATELETS: 134 10*3/uL — AB (ref 145–400)
RBC: 4.78 10*6/uL (ref 3.70–5.45)
RDW: 12.5 % (ref 11.2–14.5)
WBC: 2.5 10*3/uL — AB (ref 3.9–10.3)
lymph#: 0.9 10*3/uL (ref 0.9–3.3)

## 2017-01-19 LAB — COMPREHENSIVE METABOLIC PANEL
ALBUMIN: 3.4 g/dL — AB (ref 3.5–5.0)
ALK PHOS: 95 U/L (ref 40–150)
ALT: 12 U/L (ref 0–55)
AST: 17 U/L (ref 5–34)
Anion Gap: 6 mEq/L (ref 3–11)
BILIRUBIN TOTAL: 0.46 mg/dL (ref 0.20–1.20)
BUN: 13.1 mg/dL (ref 7.0–26.0)
CO2: 29 meq/L (ref 22–29)
CREATININE: 0.7 mg/dL (ref 0.6–1.1)
Calcium: 9.3 mg/dL (ref 8.4–10.4)
Chloride: 106 mEq/L (ref 98–109)
GLUCOSE: 93 mg/dL (ref 70–140)
Potassium: 3.3 mEq/L — ABNORMAL LOW (ref 3.5–5.1)
SODIUM: 140 meq/L (ref 136–145)
TOTAL PROTEIN: 6.8 g/dL (ref 6.4–8.3)

## 2017-01-19 NOTE — Telephone Encounter (Signed)
Oral Oncology Patient Advocate Encounter  Met patient in [facility] lobby to complete application for [employer] in an effort to obtain compassionate use drug supply for Afinitor.   This is after attempts to obtain an approved prior authorization from her insurance company were unsuccessful.    Application completed and faxed to [phone].   [employer] patient assistance phone number for follow up is [phone].    Fabio Asa. Melynda Keller, [place] [facility] [phone] 01/19/2017 12:02 PM

## 2017-01-19 NOTE — Assessment & Plan Note (Addendum)
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  Everolimus toxicities:

## 2017-01-19 NOTE — Progress Notes (Signed)
Patient Care Team: Gwendel Hanson as PCP - General (Cardiology) [name], [name], [name], [name], [name], [name], NP as Nurse Practitioner (Nurse Practitioner)  DIAGNOSIS:  Encounter Diagnosis  Name Primary?  . 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%, HER-2 negative ratio 1.43      12/10/2014 Breast MRI    Right breast lower outer quadrant: 2.3 x 2.4 x 2.4 cm rim-enhancing mass abuts the pectoralis fascia but no enhancement of pectoralis muscle, second focus of artifact?'s second tissue marker clip, no lymph nodes      12/10/2014 Clinical Stage    Stage IIA: T2 N0      12/24/2014 - 04/29/2015 Neo-Adjuvant Chemotherapy    Dose dense Adriamycin and Cytoxan 4 followed by weekly Abraxane 12      05/02/2015 Breast MRI    complete radiologic response      06/16/2015 Surgery    Left Lumpectomy: Complete path Response, 0/2 LN      06/16/2015 Pathologic Stage    ypT0 ypN0      07/23/2015 - 09/05/2015 Radiation Therapy    Adjuvant RT: 50.4 Gy in 28 fractions and a boost of 10 Gy in 5 fractions to total dose of 60.4 Gy      10/24/2015 Survivorship    SCP mailed to patient in lieu of in person visit.      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 Shes on everolimus and over the last week, she developed a sand paper rash on the chest and neck with itching. Other than that shes tolerating it well  REVIEW OF SYSTEMS:   Constitutional: Denies fevers, chills or abnormal weight loss Eyes: Denies blurriness of vision Ears, nose, mouth, throat, and face:  Denies mucositis or sore throat Respiratory: Denies cough, dyspnea or wheezes Cardiovascular: Denies palpitation, chest discomfort Gastrointestinal:  Denies nausea, heartburn or change in bowel habits Skin: Rash  Lymphatics: Denies new lymphadenopathy or easy bruising Neurological:Denies numbness, tingling or new weaknesses Behavioral/Psych: Mood is stable, no new changes  Extremities: No lower extremity edema All other systems were reviewed with the patient and are negative.  I have reviewed the past medical history, past surgical history, social history and family history with the patient and they are unchanged from  previous note.  ALLERGIES:  has No Known Allergies.  MEDICATIONS:  Current Outpatient Prescriptions  Medication Sig Dispense Refill  . ALPRAZolam (XANAX) 1 MG tablet Take 2 tablets (2 mg total) by mouth at bedtime. (Patient taking differently: Take 1 mg by mouth 2 (two) times daily as needed for anxiety or sleep. ) 60 tablet 0  . dexamethasone (DECADRON) 0.5 MG/5ML solution Swish with 24m for 234m and then spit, up to 4x daily, for 8 weeks, avoid eating/drinking for 1hr post rinse. Start with Afinitor. 500 mL 3  . DULoxetine (CYMBALTA) 20 MG capsule Take 2 capsules (40 mg total) by mouth daily. 30 capsule 3  . everolimus (AFINITOR) 7.5 MG tablet Take 1 tablet (7.5 mg total) by mouth daily. 30 tablet 3  . HYDROcodone-acetaminophen (NORCO/VICODIN) 5-325 MG tablet Take 1-2 tablets by mouth every 4 (four) hours as needed for moderate pain or severe pain. 10 tablet 0  . lamoTRIgine (LAMICTAL) 100 MG tablet Take 1 tablet by mouth daily.    . Marland Kitchenisinopril-hydrochlorothiazide (PRINZIDE,ZESTORETIC) 20-12.5 MG tablet Take 1 tablet by mouth 2 (two) times daily. 60 tablet 6  . oxyCODONE-acetaminophen (PERCOCET) 7.5-325 MG tablet Take 1-2 tablets by mouth every 4 hours as needed for pain. 60 tablet 0  . traZODone (DESYREL) 100 MG tablet Take 1 tablet by mouth daily.    . Vitamin D, Ergocalciferol, (DRISDOL) 50000 UNITS CAPS capsule Take 1 capsule (50,000 Units total) by mouth every 7 (seven) days. (Patient taking differently: Take 50,000 Units by mouth every 7 (seven) days. mondays) 12 capsule 3   No current facility-administered medications for this visit.     PHYSICAL EXAMINATION: ECOG PERFORMANCE STATUS:1  Vitals:   01/19/17 1426  BP: 132/84  Pulse: 64  Resp: 18  Temp: 98 F (36.7 C)  SpO2: 99%   Filed Weights   01/19/17 1426  Weight: 171 lb 3.2 oz (77.7 kg)    GENERAL:alert, no distress and comfortable SKIN: skin color, texture, turgor are normal, no rashes or significant lesions EYES:  normal, Conjunctiva are pink and non-injected, sclera clear OROPHARYNX:no exudate, no erythema and lips, buccal mucosa, and tongue normal  NECK: supple, thyroid normal size, non-tender, without nodularity LYMPH:  no palpable lymphadenopathy in the cervical, axillary or inguinal LUNGS: clear to auscultation and percussion with normal breathing effort HEART: regular rate & rhythm and no murmurs and no lower extremity edema ABDOMEN:abdomen soft, non-tender and normal bowel sounds MUSCULOSKELETAL:no cyanosis of digits and no clubbing  NEURO: alert & oriented x 3 with fluent speech, no focal motor/sensory deficits EXTREMITIES: No lower extremity edema  LABORATORY DATA:  I have reviewed the data as listed   Chemistry      Component Value Date/Time   NA 140 01/19/2017 1409   K 3.3 (L) 01/19/2017 1409   CL 105 12/24/2016 0634   CO2 29 01/19/2017 1409   BUN 13.1 01/19/2017 1409   CREATININE 0.7 01/19/2017 1409      Component Value Date/Time  CALCIUM 9.3 01/19/2017 1409   ALKPHOS 95 01/19/2017 1409   AST 17 01/19/2017 1409   ALT 12 01/19/2017 1409   BILITOT 0.46 01/19/2017 1409       Lab Results  Component Value Date   WBC 2.5 (L) 01/19/2017   HGB 13.7 01/19/2017   HCT 40.6 01/19/2017   MCV 84.9 01/19/2017   PLT 134 (L) 01/19/2017   NEUTROABS 1.2 (L) 01/19/2017    ASSESSMENT & PLAN:  Breast cancer of lower-outer quadrant of right female breast ([place]) Right breast biopsy 12/03/2014 8:00: Invasive ductal carcinoma, grade 3, ER 0%, PR 0%, 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 Neoadj chemo 12/24/14- 04/29/15 AC x 4 foll by Abraxane X 12 Rt Lumpectomy: Path CR 0/2 LN Adj XRT 07/23/15- 09/08/15  Cerebellar mass diagnosed 07/12/2016: Right suboccipital craniotomy for tumor resection with stereotactic navigation 07/29/16: Metastatic poorly differentiated adenocarcinoma  with extensive necrosis positive for CK 7, MOC 31, CK 5/6; Neg for Er/PR, GATA-3, GCDFP CDX2, Napsin A and TTF-1  PET/CT scan 11/17/2016:Subcutaneous nodules in the neck, upper back, left arm, abdomen and pelvis,. 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  Everolimus toxicities: 1. Rash on neck with itching.: Encouraged her to use cortisone cream. If it diesnt get better, we will need to use Medrol dose pak RTC in 6 months    I spent 25 minutes talking to the patient of which more than half was spent in counseling and coordination of care.  Orders Placed This Encounter  Procedures  . CBC with Differential    Standing Status:   Future    Standing Expiration Date:   01/19/2018  . Comprehensive metabolic panel    Standing Status:   Future    Standing Expiration Date:   01/19/2018   The patient has a good understanding of the overall plan. she agrees with it. she will call with any problems that may develop before the next visit here.   Rulon Eisenmenger, MD 01/19/17

## 2017-01-20 ENCOUNTER — Ambulatory Visit
Admission: RE | Admit: 2017-01-20 | Discharge: 2017-01-20 | Disposition: A | Discharge status: Home / Self Care | Payer: BLUE CROSS/BLUE SHIELD | Source: Ambulatory Visit | Attending: General Surgery | Admitting: General Surgery

## 2017-01-20 DIAGNOSIS — Z85841 Personal history of malignant neoplasm of brain: Secondary | ICD-10-CM

## 2017-01-20 DIAGNOSIS — Z8589 Personal history of malignant neoplasm of other organs and systems: Secondary | ICD-10-CM

## 2017-01-20 MED ORDER — IOPAMIDOL (ISOVUE-300) INJECTION 61%
75.0000 mL | Freq: Once | INTRAVENOUS | Status: AC | PRN
  Administered 2017-01-20: 75 mL via INTRAVENOUS

## 2017-01-21 ENCOUNTER — Ambulatory Visit: Payer: BLUE CROSS/BLUE SHIELD | Admitting: Hematology and Oncology

## 2017-01-21 ENCOUNTER — Other Ambulatory Visit: Payer: BLUE CROSS/BLUE SHIELD

## 2017-01-25 ENCOUNTER — Other Ambulatory Visit: Payer: BLUE CROSS/BLUE SHIELD

## 2017-01-31 ENCOUNTER — Telehealth: Payer: Self-pay | Admitting: Pharmacist

## 2017-01-31 NOTE — Telephone Encounter (Signed)
Oral Chemotherapy Pharmacist Encounter  Received call from patient that she would be taking her last Afinitor dose today. Patient has been informed she was successfully enrolled for Afinitor compassionate use from the manufacturer, however it may take 2-3 weeks before her 1st supply is shipped to her.   Dispensed samples to patient (2 week total supply):  Medication: Afinitor 5mg  tablets Instructions: Take 2 tablets (10mg ) by mouth once daily Quantity dispensed: 14 Days supply: 7 Manufacturer: [employer] Lot: [id number] Exp: 06/2017  Medication: Afinitor 10mg  tablets Instructions: Take 1 tablet (10mg ) by mouth once daily Quantity dispensed: 7 Days supply: 7 Manufacturer: [employer] Lot: [id number] Exp: 10/2017  Patient will come by [facility] today (01/31/17) about 4:45 this afternoon to pick up samples.  Patient knows to call the office with questions or concerns. Oral Oncology Clinic will continue to follow.  Johny Drilling, PharmD, BCPS, BCOP 01/31/2017 1:00 PM Oral Oncology Clinic [phone]

## 2017-01-31 NOTE — Telephone Encounter (Signed)
Oral Oncology Patient Advocate Encounter  Received notification from [employer] that patient has been successfully enrolled into their program to receive Afinitor from the manufacturer at $0 out of pocket until 05/23/2017.   I called and spoke with patient.  I provided her with the number for [employer] should she need to follow up with them.  [phone]).    She understands that we will have to re-apply for 2019 re-enrollment.    Oral Oncology Clinic will continue to follow.  Gilmore Laroche, CPhT, [place] Oral Oncology Patient Advocate [phone] 01/31/2017 9:36 AM

## 2017-02-01 ENCOUNTER — Encounter: Payer: Self-pay | Admitting: *Deleted

## 2017-02-01 ENCOUNTER — Telehealth: Payer: Self-pay | Admitting: Hematology and Oncology

## 2017-02-01 ENCOUNTER — Ambulatory Visit (HOSPITAL_BASED_OUTPATIENT_CLINIC_OR_DEPARTMENT_OTHER): Payer: BLUE CROSS/BLUE SHIELD | Admitting: Hematology and Oncology

## 2017-02-01 ENCOUNTER — Encounter: Payer: Self-pay | Admitting: Hematology and Oncology

## 2017-02-01 ENCOUNTER — Other Ambulatory Visit (HOSPITAL_BASED_OUTPATIENT_CLINIC_OR_DEPARTMENT_OTHER): Payer: BLUE CROSS/BLUE SHIELD

## 2017-02-01 DIAGNOSIS — C7931 Secondary malignant neoplasm of brain: Secondary | ICD-10-CM | POA: Diagnosis not present

## 2017-02-01 DIAGNOSIS — Z171 Estrogen receptor negative status [ER-]: Secondary | ICD-10-CM

## 2017-02-01 DIAGNOSIS — C50511 Malignant neoplasm of lower-outer quadrant of right female breast: Secondary | ICD-10-CM | POA: Diagnosis not present

## 2017-02-01 LAB — CBC WITH DIFFERENTIAL/PLATELET
BASO%: 0.7 % (ref 0.0–2.0)
BASOS ABS: 0 10*3/uL (ref 0.0–0.1)
EOS%: 1.1 % (ref 0.0–7.0)
Eosinophils Absolute: 0 10*3/uL (ref 0.0–0.5)
HEMATOCRIT: 39.6 % (ref 34.8–46.6)
HEMOGLOBIN: 13.3 g/dL (ref 11.6–15.9)
LYMPH#: 1 10*3/uL (ref 0.9–3.3)
LYMPH%: 32.4 % (ref 14.0–49.7)
MCH: 28 pg (ref 25.1–34.0)
MCHC: 33.6 g/dL (ref 31.5–36.0)
MCV: 83.3 fL (ref 79.5–101.0)
MONO#: 0.5 10*3/uL (ref 0.1–0.9)
MONO%: 16.4 % — ABNORMAL HIGH (ref 0.0–14.0)
NEUT#: 1.5 10*3/uL (ref 1.5–6.5)
NEUT%: 49.4 % (ref 38.4–76.8)
Platelets: 158 10*3/uL (ref 145–400)
RBC: 4.75 10*6/uL (ref 3.70–5.45)
RDW: 12.5 % (ref 11.2–14.5)
WBC: 3 10*3/uL — ABNORMAL LOW (ref 3.9–10.3)

## 2017-02-01 LAB — COMPREHENSIVE METABOLIC PANEL
ALBUMIN: 3.3 g/dL — AB (ref 3.5–5.0)
ALK PHOS: 97 U/L (ref 40–150)
ALT: 11 U/L (ref 0–55)
AST: 17 U/L (ref 5–34)
Anion Gap: 8 mEq/L (ref 3–11)
BILIRUBIN TOTAL: 0.36 mg/dL (ref 0.20–1.20)
BUN: 7.4 mg/dL (ref 7.0–26.0)
CALCIUM: 9 mg/dL (ref 8.4–10.4)
CO2: 24 mEq/L (ref 22–29)
Chloride: 108 mEq/L (ref 98–109)
Creatinine: 0.7 mg/dL (ref 0.6–1.1)
EGFR: >90 mL/min/{1.73_m2} (ref >90)
Glucose: 88 mg/dl (ref 70–140)
POTASSIUM: 3.6 meq/L (ref 3.5–5.1)
Sodium: 139 mEq/L (ref 136–145)
Total Protein: 6.5 g/dL (ref 6.4–8.3)

## 2017-02-01 NOTE — Progress Notes (Signed)
Patient Care Team: Gwendel Hanson as PCP - General (Cardiology) [name], [name], [name], [name], [name], [name], NP as Nurse Practitioner (Nurse Practitioner)  DIAGNOSIS:  Encounter Diagnosis  Name Primary?  . 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%, HER-2 negative ratio 1.43      12/10/2014 Breast MRI    Right breast lower outer quadrant: 2.3 x 2.4 x 2.4 cm rim-enhancing mass abuts the pectoralis fascia but no enhancement of pectoralis muscle, second focus of artifact?'s second tissue marker clip, no lymph nodes      12/10/2014 Clinical Stage    Stage IIA: T2 N0      12/24/2014 - 04/29/2015 Neo-Adjuvant Chemotherapy    Dose dense Adriamycin and Cytoxan 4 followed by weekly Abraxane 12      05/02/2015 Breast MRI    complete radiologic response      06/16/2015 Surgery    Left Lumpectomy: Complete path Response, 0/2 LN      06/16/2015 Pathologic Stage    ypT0 ypN0      07/23/2015 - 09/05/2015 Radiation Therapy    Adjuvant RT: 50.4 Gy in 28 fractions and a boost of 10 Gy in 5 fractions to total dose of 60.4 Gy      10/24/2015 Survivorship    SCP mailed to patient in lieu of in person visit.      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 She had profound rash on the chest and the back which has improved with topical steroid creams. She does not have any mucositis. Denies any nausea vomiting. Energy levels appear to be fairly good. She is approved for free drug from [employer].  REVIEW OF SYSTEMS:  Constitutional: Denies fevers, chills or abnormal weight loss Eyes: Denies blurriness of vision Ears, nose, mouth, throat, and face: Denies mucositis or sore throat Respiratory: Denies cough, dyspnea or wheezes Cardiovascular: Denies palpitation, chest discomfort Gastrointestinal:  Denies nausea, heartburn or change in bowel habits Skin: Papular skin rash on the chest has improved significantly Lymphatics: Denies new lymphadenopathy or easy bruising Neurological:Denies numbness, tingling or new weaknesses Behavioral/Psych: Mood is stable, no new changes  Extremities: No lower  extremity edema  All other systems were reviewed with the patient and are negative.  I have reviewed the past medical history, past surgical history, social history and family history with the patient and they are unchanged from previous note.  ALLERGIES:  has No Known Allergies.  MEDICATIONS:  Current Outpatient Prescriptions  Medication Sig Dispense Refill  . ALPRAZolam (XANAX) 1 MG tablet Take 2 tablets (2 mg total) by mouth at bedtime. (Patient taking differently: Take 1 mg by mouth 2 (two) times daily as needed for anxiety or sleep. ) 60 tablet 0  . dexamethasone (DECADRON) 0.5 MG/5ML solution Swish with 52m for 271m and then spit, up to 4x daily, for 8 weeks, avoid eating/drinking for 1hr post rinse. Start with Afinitor. 500 mL 3  . DULoxetine (CYMBALTA) 20 MG capsule Take 2 capsules (40 mg total) by mouth daily. 30 capsule 3  . everolimus (AFINITOR) 7.5 MG tablet Take 1 tablet (7.5 mg total) by mouth daily. 30 tablet 3  . HYDROcodone-acetaminophen (NORCO/VICODIN) 5-325 MG tablet Take 1-2 tablets by mouth every 4 (four) hours as needed for moderate pain or severe pain. 10 tablet 0  . lamoTRIgine (LAMICTAL) 100 MG tablet Take 1 tablet by mouth daily.    . Marland Kitchenisinopril-hydrochlorothiazide (PRINZIDE,ZESTORETIC) 20-12.5 MG tablet Take 1 tablet by mouth 2 (two) times daily. 60 tablet 6  . oxyCODONE-acetaminophen (PERCOCET) 7.5-325 MG tablet Take 1-2 tablets by mouth every 4 hours as needed for pain. 60 tablet 0  . traZODone (DESYREL) 100 MG tablet Take 1 tablet by mouth daily.    . Vitamin D, Ergocalciferol, (DRISDOL) 50000 UNITS CAPS capsule Take 1 capsule (50,000 Units total) by mouth every 7 (seven) days. (Patient taking differently: Take 50,000 Units by mouth every 7 (seven) days. mondays) 12 capsule 3   No current facility-administered medications for this visit.     PHYSICAL EXAMINATION: ECOG PERFORMANCE STATUS: 1 - Symptomatic but completely ambulatory  Vitals:   02/01/17 1536    BP: (!) 147/87  Pulse: 69  Resp: 18  Temp: 97.8 F (36.6 C)  SpO2: 99%   Filed Weights   02/01/17 1536  Weight: 177 lb 9.6 oz (80.6 kg)    GENERAL:alert, no distress and comfortable SKIN: skin color, texture, turgor are normal, no rashes or significant lesions EYES: normal, Conjunctiva are pink and non-injected, sclera clear OROPHARYNX:no exudate, no erythema and lips, buccal mucosa, and tongue normal  NECK: supple, thyroid normal size, non-tender, without nodularity LYMPH:  no palpable lymphadenopathy in the cervical, axillary or inguinal LUNGS: clear to auscultation and percussion with normal breathing effort HEART: regular rate & rhythm and no murmurs and no lower extremity edema ABDOMEN:abdomen soft, non-tender and normal bowel sounds MUSCULOSKELETAL:no cyanosis of digits and no clubbing  NEURO: alert & oriented x 3 with fluent speech, no focal motor/sensory deficits EXTREMITIES: No lower extremity edema  LABORATORY DATA:  I have reviewed the data as listed   Chemistry      Component Value Date/Time   NA 139 02/01/2017 1514   K 3.6  02/01/2017 1514   CL 105 12/24/2016 0634   CO2 24 02/01/2017 1514   BUN 7.4 02/01/2017 1514   CREATININE 0.7 02/01/2017 1514      Component Value Date/Time   CALCIUM 9.0 02/01/2017 1514   ALKPHOS 97 02/01/2017 1514   AST 17 02/01/2017 1514   ALT 11 02/01/2017 1514   BILITOT 0.36 02/01/2017 1514       Lab Results  Component Value Date   WBC 3.0 (L) 02/01/2017   HGB 13.3 02/01/2017   HCT 39.6 02/01/2017   MCV 83.3 02/01/2017   PLT 158 02/01/2017   NEUTROABS 1.5 02/01/2017    ASSESSMENT & PLAN:  Breast cancer of lower-outer quadrant of right female breast ([place]) Right breast biopsy 12/03/2014 8:00: Invasive ductal carcinoma, grade 3, ER 0%, PR 0%, 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  Everolimus toxicities: 1. Rash on neck with itching.: Improved with cortisone cream. I reviewed her blood work from today and there appeared to be stable. Mild hypoalbuminemia  RTC in 2 months with scans   I spent 25 minutes talking to the patient of which more than half was spent in counseling and coordination of care.  Orders Placed This Encounter  Procedures  . CT Abdomen Pelvis W Contrast    Standing Status:   Future    Standing Expiration Date:   02/01/2018    Order Specific Question:   If indicated for the ordered procedure, I authorize the administration of contrast media per Radiology protocol    Answer:   Yes    Order Specific Question:   Is patient pregnant?    Answer:   No    Order Specific Question:   Preferred imaging location?    Answer:   [facility]    Order Specific Question:   Radiology Contrast Protocol - do NOT remove file path    Answer:   _0 [email or web address]    Order Specific Question:   Reason for Exam additional comments    Answer:   Metastatic breast cancer restaging  . CT Chest W Contrast    Standing Status:   Future    Standing Expiration  Date:   02/01/2018    Order Specific Question:   If indicated for the ordered procedure, I authorize the administration of contrast media per Radiology protocol    Answer:   Yes    Order Specific Question:   Is patient pregnant?    Answer:   No    Order Specific Question:   Preferred imaging location?    Answer:   [facility]    Order Specific  Question:   Radiology Contrast Protocol - do NOT remove file path    Answer:   [email or web address]    Order Specific Question:   Reason for Exam additional comments    Answer:   Metastatic breast cancer restaging   The patient has a good understanding of the overall plan. she agrees with it. she will call with any problems that may develop before the next visit here.   Rulon Eisenmenger, MD 02/01/17

## 2017-02-01 NOTE — Assessment & Plan Note (Signed)
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  Everolimus toxicities: 1. Rash on neck with itching.: Encouraged her to use cortisone cream. If it doesnt get better, we will need to use Medrol dose pak RTC in 2 months with scans

## 2017-02-01 NOTE — Telephone Encounter (Signed)
Gave patient avs and calendar with appts.  °

## 2017-02-07 ENCOUNTER — Ambulatory Visit: Payer: Self-pay | Admitting: Radiation Oncology

## 2017-02-16 NOTE — Progress Notes (Signed)
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  Headache:Yes right back mostly everyday, not taking anything to relieve the headache. Pain:No Dizziness:No Nausea/vomiting:No Ringing in ears:No Visual changes (Blurred/ diplopia double vision,blind spots, and peripheral vsion changes):Blurred vision in the right eye at times. Fatigue:Reports fatigue. Cognitive changes:Alert and oriented x 3,forgetful feels it is getting worse. Imaging:02-17-17 MRI brain 02-01-17 Saw Dr. Lindi Adie follow-up on Everolimus. Wt Readings from Last 3 Encounters:  02/01/17 177 lb 9.6 oz (80.6 kg)  01/19/17 171 lb 3.2 oz (77.7 kg)  12/31/16 172 lb 6.4 oz (78.2 kg)  BP 113/83   Pulse 95   Temp 98.2 F (36.8 C) (Oral)   Resp 18   Ht 5\' 3"  (1.6 m)   Wt 173 lb 3.2 oz (78.6 kg)   SpO2 100%   BMI 30.68 kg/m

## 2017-02-17 ENCOUNTER — Ambulatory Visit
Admission: RE | Admit: 2017-02-17 | Discharge: 2017-02-17 | Disposition: A | Discharge status: Home / Self Care | Payer: BLUE CROSS/BLUE SHIELD | Source: Ambulatory Visit | Attending: Radiation Oncology | Admitting: Radiation Oncology

## 2017-02-17 DIAGNOSIS — C7931 Secondary malignant neoplasm of brain: Secondary | ICD-10-CM

## 2017-02-17 DIAGNOSIS — C7949 Secondary malignant neoplasm of other parts of nervous system: Principal | ICD-10-CM

## 2017-02-17 MED ORDER — GADOBENATE DIMEGLUMINE 529 MG/ML IV SOLN
15.0000 mL | Freq: Once | INTRAVENOUS | Status: AC | PRN
  Administered 2017-02-17: 15 mL via INTRAVENOUS

## 2017-02-21 ENCOUNTER — Ambulatory Visit
Admission: RE | Admit: 2017-02-21 | Discharge: 2017-02-21 | Disposition: A | Discharge status: Home / Self Care | Payer: BLUE CROSS/BLUE SHIELD | Source: Ambulatory Visit | Attending: Radiation Oncology | Admitting: Radiation Oncology

## 2017-02-21 ENCOUNTER — Encounter: Payer: Self-pay | Admitting: Radiation Oncology

## 2017-02-21 VITALS — BP 113/83 | HR 95 | Temp 98.2°F | Resp 18 | Ht 63.0 in | Wt 173.2 lb

## 2017-02-21 DIAGNOSIS — F419 Anxiety disorder, unspecified: Secondary | ICD-10-CM | POA: Diagnosis not present

## 2017-02-21 DIAGNOSIS — C50511 Malignant neoplasm of lower-outer quadrant of right female breast: Secondary | ICD-10-CM | POA: Diagnosis present

## 2017-02-21 DIAGNOSIS — F329 Major depressive disorder, single episode, unspecified: Secondary | ICD-10-CM | POA: Insufficient documentation

## 2017-02-21 DIAGNOSIS — C7931 Secondary malignant neoplasm of brain: Secondary | ICD-10-CM | POA: Diagnosis not present

## 2017-02-21 NOTE — Progress Notes (Signed)
Radiation Oncology         [phone] ________________________________  Name: Prezley Qadir MRN: [id number]  Date: 02/21/2017  DOB: 1966/01/11  Follow-Up Visit Note  CC: Secundino Ginger, PA-C  Ditty, Kevan Ny, *  Diagnosis:    51 y.o. 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        ICD-10-CM   1. 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 Malignant neoplasm of lower-outer quadrant of right female breast, unspecified estrogen receptor status ([place]) C50.511     Interval Since Last Radiation: 7 months  07/26/16 Preop SRS Treatment: PTV1 Right Cerebellum was treated to 18 Gy in 1 fraction  07/23/15-09/05/15 50.4 Gy to the right breast + 10 Gy boost  Narrative:   [name] is a pleasant 51 y.o. 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 She was diagnosed with her cancer in July 2016, and underwent neoadjuvant chemotherapy followed by right lumpectomy and adjuvant radiation. She was found to have recurrent disease in February 2018 with a right cerebellar mass, and underwent preoperative SRS treatment followed by surgical resection on 07/29/2016. She is being followed in our brain oncology conference, and her last MRI scan on 02/17/2017 revealed stability of her previously treated site of disease, with stability of her resection cavity, and no new disease. She comes today to review these findings.  On review of systems, the patient reports that she is doing well overall.although she is not having headaches specifically, visual or auditory changes, she does report that she feels a pulling stretching stretching sensation in her resection site, she states in the last week and a half she's noticed this every day and last for several minutes following up to half an hour, she states that she is not taking anything to try and help this, and again  reports that is not a headache per se. She states states that she is unable to feel any movement in her scalp, that sometimes feels like her scalp is softer than others. She denies any fullness or pressure. She denies any chest pain, shortness of breath, cough, fevers, chills, night sweats, unintended weight changes. She denies any bowel or bladder disturbances, and denies abdominal pain, nausea or vomiting. She denies any new musculoskeletal or joint aches or pains, new skin lesions or concerns. A complete review of systems is obtained and is otherwise negative.  Past Medical History:  Past Medical History:  Diagnosis Date  . Anxiety   . Arthritis   . Back pain   . Brain cancer ([place])   . Breast cancer ([place])   . Breast cancer of lower-outer quadrant of right female breast ([place]) 12/05/2014  . Depression   . FH: chemotherapy 12/2014-04/2015  . Genetic testing 12/10/2016   [name] underwent genetic counseling and testing for hereditary cancer syndromes on 11/18/2016. 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 Mutations in MSH6 are associated with a hereditary cancer syndrome called Lynch syndrome. For more detailed discussion, please see genetic counseling documentation from 12/10/2016.  Testing was perfo  . Headache    due to brain cancer, no longer having them  . Hot flashes   . Hypertension   . 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 underwent genetic counseling and testing for hereditary cancer syndromes on 11/18/2016. Her results are positive for a pathogenic mutation  in MSH6 called c.[id number] (p.Ile944Metfs*4). Mutations in MSH6 are associated with a hereditary cancer syndrome called Lynch syndrome. For more detailed discussion, please see genetic counseling documentation from 12/10/2016.  Testing was perfo  . 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 Radiation 07/23/15-09/05/15    right breast 50.4 Gy, boost of 10 Gy    Past Surgical History: Past Surgical History:  Procedure Laterality Date  . APPLICATION OF CRANIAL NAVIGATION Right 07/27/2016   Procedure: APPLICATION OF CRANIAL NAVIGATION;  Surgeon: Kevan Ny Ditty, MD;  Location: [place];  Service: Neurosurgery;  Laterality: Right;  . BIOPSY OF SKIN SUBCUTANEOUS TISSUE AND/OR MUCOUS MEMBRANE Left 12/24/2016   Procedure: OPEN BIOPSY LESIONS ON LEFT LOWER BACK AND SHOULDER BLADE;  Surgeon: [name], MD;  Location: [place];  Service: General;  Laterality: Left;  . BREAST LUMPECTOMY WITH NEEDLE LOCALIZATION AND AXILLARY SENTINEL LYMPH NODE BX Right 06/16/2015   Procedure: BREAST LUMPECTOMY WITH NEEDLE LOCALIZATION AND AXILLARY SENTINEL LYMPH NODE BX;  Surgeon: [name], MD;  Location: [place];  Service: General;  Laterality: Right;  . BREAST REDUCTION SURGERY    . CRANIOTOMY Right 07/27/2016   Procedure: Right Suboccipital craniotomy for tumor resection with stereotactic navigation;  Surgeon: Kevan Ny Ditty, MD;  Location: [place];  Service: Neurosurgery;  Laterality: Right;  . PORT-A-CATH REMOVAL N/A 06/16/2015   Procedure: REMOVAL PORT-A-CATH;  Surgeon: [name], MD;  Location: [place];  Service: General;  Laterality: N/A;  . PORTACATH PLACEMENT N/A 12/23/2014   Procedure: INSERTION PORT-A-CATH;  Surgeon: [name], MD;  Location: [place];  Service: General;  Laterality: N/A;    Social History:  Social History   Social History  . Marital status: Divorced    Spouse name: N/A  . Number of children: 1  . Years of education: N/A   Occupational History  .     Social History Main Topics  . Smoking status: Never Smoker  . Smokeless tobacco: Never Used  . Alcohol use Yes     Comment: social  . Drug use: No  . Sexual activity: Yes   Other Topics Concern  . Not on file   Social History Narrative  . No narrative on file  The patient is  divorced. She has worked for [employer], and is accompanied today by her sister  Family History: Family History  Problem Relation Age of Onset  . Aneurysm Mother 34       d.55  . Heart attack Father 33       d.62  . Endometrial cancer Sister 11  . Lung cancer Maternal Uncle        d.68s  . Cancer Maternal Grandmother        unspecified type-possibly stomach d.88s                          ALLERGIES:  has No Known Allergies.  Meds: Current Outpatient Prescriptions  Medication Sig Dispense Refill  . everolimus (AFINITOR) 7.5 MG tablet Take 1 tablet (7.5 mg total) by mouth daily. 30 tablet 3  . lisinopril-hydrochlorothiazide (PRINZIDE,ZESTORETIC) 20-12.5 MG tablet Take 1 tablet by mouth 2 (two) times daily. 60 tablet 6  . ALPRAZolam (XANAX) 1 MG tablet Take 2 tablets (2 mg total) by mouth at bedtime. (Patient not taking: Reported on 02/21/2017) 60 tablet 0  . dexamethasone (DECADRON) 0.5 MG/5ML solution Swish  with 77m for 290m and then spit, up to 4x daily, for 8 weeks, avoid eating/drinking for 1hr post rinse. Start with Afinitor. (Patient not taking: Reported on 02/21/2017) 500 mL 3  . DULoxetine (CYMBALTA) 20 MG capsule Take 2 capsules (40 mg total) by mouth daily. (Patient not taking: Reported on 02/21/2017) 30 capsule 3  . HYDROcodone-acetaminophen (NORCO/VICODIN) 5-325 MG tablet Take 1-2 tablets by mouth every 4 (four) hours as needed for moderate pain or severe pain. (Patient not taking: Reported on 02/21/2017) 10 tablet 0  . lamoTRIgine (LAMICTAL) 100 MG tablet Take 1 tablet by mouth daily.    . Marland KitchenxyCODONE-acetaminophen (PERCOCET) 7.5-325 MG tablet Take 1-2 tablets by mouth every 4 hours as needed for pain. (Patient not taking: Reported on 02/21/2017) 60 tablet 0  . traZODone (DESYREL) 100 MG tablet Take 1 tablet by mouth daily.    . Vitamin D, Ergocalciferol, (DRISDOL) 50000 UNITS CAPS capsule Take 1 capsule (50,000 Units total) by mouth every 7 (seven) days. (Patient not  taking: Reported on 02/21/2017) 12 capsule 3   No current facility-administered medications for this encounter.     Physical Findings:   height is '5\' 3"'  (1.6 m) and weight is 173 lb 3.2 oz (78.6 kg). Her oral temperature is 98.2 F (36.8 C). Her blood pressure is 113/83 and her pulse is 95. Her respiration is 18 and oxygen saturation is 100%.   Pain Assessment Pain Score: 0-No pain/10  In general this is a well appearing African American female in no acute distress. She's alert and oriented x4 and appropriate throughout the examination. Cardiopulmonary assessment is negative for acute distress and she exhibits normal effort.    Lab Findings: Lab Results  Component Value Date   WBC 3.0 (L) 02/01/2017   WBC 4.9 12/24/2016   HGB 13.3 02/01/2017   HCT 39.6 02/01/2017   PLT 158 02/01/2017    Lab Results  Component Value Date   NA 139 02/01/2017   K 3.6 02/01/2017   CHLORIDE 108 02/01/2017   CO2 24 02/01/2017   GLUCOSE 88 02/01/2017   BUN 7.4 02/01/2017   CREATININE 0.7 02/01/2017   BILITOT 0.36 02/01/2017   ALKPHOS 97 02/01/2017   AST 17 02/01/2017   ALT 11 02/01/2017   PROT 6.5 02/01/2017   ALBUMIN 3.3 (L) 02/01/2017   CALCIUM 9.0 02/01/2017   ANIONGAP 8 02/01/2017   ANIONGAP 7 12/24/2016    Radiographic Findings: Mr BrJeri CosoOMontrast  Result Date: 02/17/2017 CLINICAL DATA:  [age]/o female with 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 Restaging. EXAM: MRI HEAD WITHOUT AND WITH CONTRAST TECHNIQUE: Multiplanar, multiecho pulse sequences of the brain and surrounding structures were obtained without and with intravenous contrast. CONTRAST:  1530mULTIHANCE GADOBENATE DIMEGLUMINE 529 MG/ML IV SOLN COMPARISON:  11/16/2016 brain MRI and earlier. FINDINGS: Brain: Stable cerebral volume. No restricted diffusion to suggest acute infarction. No midline shift, mass effect, evidence of mass  lesion, ventriculomegaly, extra-axial collection or acute intracranial hemorrhage. Cervicomedullary junction and pituitary are within normal limits. Right lateral and inferior region cerebellar resection site with stable appearance of craniectomy granulation tissue and dural repair. Faint associated cerebellar encephalomalacia (series 7, image 6) is stable with no associated parenchymal enhancement. No cerebellar edema or mass effect. No abnormal intracranial enhancement elsewhere. No suspicious dural thickening or enhancement. Stable and unremarkable for age cerebral gray and white matter signal. Vascular: Major intracranial vascular flow voids are  stable. Skull and upper cervical spine: Chronic cervical spine degeneration. Negative visualized cervical spinal cord. Stable visible bone marrow signal in the skull and cervical spine. Sinuses/Orbits: Stable and negative. Other: Visible internal auditory structures appear normal. Mastoid air cells remain clear. No acute scalp soft tissue findings. IMPRESSION: 1. Continued stable and satisfactory post treatment appearance of the right cerebellum. 2. No new metastatic disease or intracranial abnormality. Electronically Signed   By: Genevie Ann M.D.   On: 02/17/2017 15:17    Impression/Plan: 1. 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 Patient appears to be radiographically stable, her MRIs reviewed from the morning conference, and we would recommend she have repeat MRI in 3 months time. She is in agreement with this plan.she will also continue with Dr. Lindi Adie her systemic therapy. We will follow this as well as the next interval scan. 2. Discomfort at surgical site. The patient is counseled on discussing her symptoms with Dr. Cyndy Freeze and we will try and contact him regarding this as well. She specifically states that these are not headache, and we'll also try and keep track of the timing that she  experiences this as it may be related to stress, hydration, or musculoskeletal symptom.     Carola Rhine, PAC

## 2017-03-02 ENCOUNTER — Other Ambulatory Visit: Payer: Self-pay

## 2017-03-03 ENCOUNTER — Other Ambulatory Visit: Payer: Self-pay | Admitting: *Deleted

## 2017-03-03 MED ORDER — EVEROLIMUS 7.5 MG PO TABS
7.5000 mg | ORAL_TABLET | Freq: Every day | ORAL | 0 refills | Status: DC
Start: 2017-03-03 — End: 2017-04-11

## 2017-04-01 ENCOUNTER — Other Ambulatory Visit: Payer: Self-pay

## 2017-04-01 DIAGNOSIS — C50511 Malignant neoplasm of lower-outer quadrant of right female breast: Secondary | ICD-10-CM

## 2017-04-04 ENCOUNTER — Other Ambulatory Visit (HOSPITAL_BASED_OUTPATIENT_CLINIC_OR_DEPARTMENT_OTHER): Payer: BLUE CROSS/BLUE SHIELD

## 2017-04-04 ENCOUNTER — Encounter (HOSPITAL_COMMUNITY): Payer: Self-pay

## 2017-04-04 ENCOUNTER — Ambulatory Visit (HOSPITAL_COMMUNITY)
Admission: RE | Admit: 2017-04-04 | Discharge: 2017-04-04 | Disposition: A | Discharge status: Home / Self Care | Payer: BLUE CROSS/BLUE SHIELD | Source: Ambulatory Visit | Attending: Hematology and Oncology | Admitting: Hematology and Oncology

## 2017-04-04 DIAGNOSIS — R590 Localized enlarged lymph nodes: Secondary | ICD-10-CM | POA: Insufficient documentation

## 2017-04-04 DIAGNOSIS — C50511 Malignant neoplasm of lower-outer quadrant of right female breast: Secondary | ICD-10-CM | POA: Diagnosis not present

## 2017-04-04 DIAGNOSIS — R918 Other nonspecific abnormal finding of lung field: Secondary | ICD-10-CM | POA: Diagnosis not present

## 2017-04-04 DIAGNOSIS — Z171 Estrogen receptor negative status [ER-]: Secondary | ICD-10-CM | POA: Insufficient documentation

## 2017-04-04 DIAGNOSIS — I700 Atherosclerosis of aorta: Secondary | ICD-10-CM | POA: Insufficient documentation

## 2017-04-04 DIAGNOSIS — C786 Secondary malignant neoplasm of retroperitoneum and peritoneum: Secondary | ICD-10-CM | POA: Insufficient documentation

## 2017-04-04 LAB — CBC WITH DIFFERENTIAL/PLATELET
BASO%: 0.2 % (ref 0.0–2.0)
Basophils Absolute: 0 10*3/uL (ref 0.0–0.1)
EOS%: 0.9 % (ref 0.0–7.0)
Eosinophils Absolute: 0 10*3/uL (ref 0.0–0.5)
HCT: 35.9 % (ref 34.8–46.6)
HGB: 11.7 g/dL (ref 11.6–15.9)
LYMPH%: 27.5 % (ref 14.0–49.7)
MCH: 25.5 pg (ref 25.1–34.0)
MCHC: 32.6 g/dL (ref 31.5–36.0)
MCV: 78.4 fL — ABNORMAL LOW (ref 79.5–101.0)
MONO#: 0.4 10*3/uL (ref 0.1–0.9)
MONO%: 9.8 % (ref 0.0–14.0)
NEUT#: 2.7 10*3/uL (ref 1.5–6.5)
NEUT%: 61.6 % (ref 38.4–76.8)
Platelets: 207 10*3/uL (ref 145–400)
RBC: 4.58 10*6/uL (ref 3.70–5.45)
RDW: 12.8 % (ref 11.2–14.5)
WBC: 4.4 10*3/uL (ref 3.9–10.3)
lymph#: 1.2 10*3/uL (ref 0.9–3.3)

## 2017-04-04 LAB — COMPREHENSIVE METABOLIC PANEL
ALT: 12 U/L (ref 0–55)
AST: 18 U/L (ref 5–34)
Albumin: 3 g/dL — ABNORMAL LOW (ref 3.5–5.0)
Alkaline Phosphatase: 131 U/L (ref 40–150)
Anion Gap: 7 mEq/L (ref 3–11)
BUN: 5.8 mg/dL — AB (ref 7.0–26.0)
CHLORIDE: 108 meq/L (ref 98–109)
CO2: 24 mEq/L (ref 22–29)
Calcium: 8.6 mg/dL (ref 8.4–10.4)
Creatinine: 0.8 mg/dL (ref 0.6–1.1)
EGFR: >60 mL/min/{1.73_m2} (ref >60)
GLUCOSE: 76 mg/dL (ref 70–140)
POTASSIUM: 3.7 meq/L (ref 3.5–5.1)
SODIUM: 139 meq/L (ref 136–145)
Total Bilirubin: 0.43 mg/dL (ref 0.20–1.20)
Total Protein: 7 g/dL (ref 6.4–8.3)

## 2017-04-04 MED ORDER — IOPAMIDOL (ISOVUE-300) INJECTION 61%
INTRAVENOUS | Status: AC
  Filled 2017-04-04: qty 100

## 2017-04-04 MED ORDER — IOPAMIDOL (ISOVUE-300) INJECTION 61%
100.0000 mL | Freq: Once | INTRAVENOUS | Status: AC | PRN
  Administered 2017-04-04: 100 mL via INTRAVENOUS

## 2017-04-10 ENCOUNTER — Other Ambulatory Visit: Payer: Self-pay | Admitting: Radiation Therapy

## 2017-04-10 DIAGNOSIS — C7949 Secondary malignant neoplasm of other parts of nervous system: Principal | ICD-10-CM

## 2017-04-10 DIAGNOSIS — C7931 Secondary malignant neoplasm of brain: Secondary | ICD-10-CM

## 2017-04-11 ENCOUNTER — Ambulatory Visit (HOSPITAL_BASED_OUTPATIENT_CLINIC_OR_DEPARTMENT_OTHER): Payer: BLUE CROSS/BLUE SHIELD | Admitting: Hematology and Oncology

## 2017-04-11 ENCOUNTER — Encounter: Payer: Self-pay | Admitting: *Deleted

## 2017-04-11 DIAGNOSIS — C50511 Malignant neoplasm of lower-outer quadrant of right female breast: Secondary | ICD-10-CM

## 2017-04-11 DIAGNOSIS — Z171 Estrogen receptor negative status [ER-]: Secondary | ICD-10-CM | POA: Diagnosis not present

## 2017-04-11 DIAGNOSIS — C7931 Secondary malignant neoplasm of brain: Secondary | ICD-10-CM

## 2017-04-11 MED ORDER — CAPECITABINE 500 MG PO TABS: 850.0000 mg/m2 | ORAL_TABLET | Freq: Two times a day (BID) | ORAL | 3 refills | Status: DC

## 2017-04-11 MED ORDER — ONDANSETRON HCL 8 MG PO TABS: 8.0000 mg | ORAL_TABLET | Freq: Three times a day (TID) | ORAL | 3 refills | Status: DC | PRN

## 2017-04-11 NOTE — Progress Notes (Signed)
Patient Care Team: Gwendel Hanson as PCP - General (Cardiology) [name], [name], [name], [name], [name], [name], NP as Nurse Practitioner (Nurse Practitioner)  DIAGNOSIS:  Encounter Diagnosis  Name Primary?  . 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%, HER-2 negative ratio 1.43      12/10/2014 Breast MRI    Right breast lower outer quadrant: 2.3 x 2.4 x 2.4 cm rim-enhancing mass abuts the pectoralis fascia but no enhancement of pectoralis muscle, second focus of artifact?'s second tissue marker clip, no lymph nodes      12/10/2014 Clinical Stage    Stage IIA: T2 N0      12/24/2014 - 04/29/2015 Neo-Adjuvant Chemotherapy    Dose dense Adriamycin and Cytoxan 4 followed by weekly Abraxane 12      05/02/2015 Breast MRI    complete radiologic response      06/16/2015 Surgery    Left Lumpectomy: Complete path Response, 0/2 LN      06/16/2015 Pathologic Stage    ypT0 ypN0      07/23/2015 - 09/05/2015 Radiation Therapy    Adjuvant RT: 50.4 Gy in 28 fractions and a boost of 10 Gy in 5 fractions to total dose of 60.4 Gy      10/24/2015 Survivorship    SCP mailed to patient in lieu of in person visit.      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 identified in BARD 1, BRCA2 and NF1      12/31/2016 Miscellaneous    Everolimus 10 mg daily for cycle 1 if she cannot tolerate will decrease to 7.5 mg daily       CHIEF COMPLIANT: Follow-up to review the recently performed scans for metastatic breast cancer  INTERVAL HISTORY: [name] is a 51 year old with above-mentioned history of metastatic breast cancer who is currently on therapy with everolimus.  She had recent CT scans and is here today to discuss the results.  On the recent CT scan she was noted to have worsening metastatic disease.  She is here to discuss the treatment change.  REVIEW OF SYSTEMS:  Constitutional: Denies fevers, chills or abnormal weight loss Eyes: Denies blurriness of vision Ears, nose, mouth, throat, and face: Denies mucositis or sore throat Respiratory: Denies cough, dyspnea or wheezes Cardiovascular: Denies palpitation, chest discomfort Gastrointestinal:  Denies nausea, heartburn or change in bowel habits Skin: Denies abnormal skin rashes Lymphatics: Denies new lymphadenopathy or easy bruising Neurological:Denies numbness, tingling or new weaknesses Behavioral/Psych: Mood is stable, no new changes  Extremities: No lower extremity edema  All other systems were reviewed with  the patient and are negative.  I have reviewed the past medical history, past surgical history, social history and family history with the patient and they are unchanged from previous note.  ALLERGIES:  has No Known Allergies.  MEDICATIONS:  Current Outpatient Medications  Medication Sig Dispense Refill  . ALPRAZolam (XANAX) 1 MG tablet Take 2 tablets (2 mg total) by mouth at bedtime. (Patient not taking: Reported on 02/21/2017) 60 tablet 0  . capecitabine (XELODA) 500 MG tablet Take 3 tablets (1,500 mg total) 2 (two) times daily after a meal by mouth. 90 tablet 3  . lisinopril-hydrochlorothiazide (PRINZIDE,ZESTORETIC) 20-12.5 MG tablet Take 1 tablet by mouth 2 (two) times daily. 60 tablet 6  . ondansetron (ZOFRAN) 8 MG tablet Take 1 tablet (8 mg total) every 8 (eight) hours as needed by mouth for nausea. 30 tablet 3  . traZODone (DESYREL) 100 MG tablet Take 1 tablet by mouth daily.    . Vitamin D, Ergocalciferol, (DRISDOL) 50000 UNITS CAPS capsule Take 1 capsule (50,000 Units total) by mouth every 7 (seven) days. (Patient not taking: Reported on 02/21/2017) 12 capsule 3   No current facility-administered medications for this visit.     PHYSICAL EXAMINATION: ECOG PERFORMANCE STATUS: 1 - Symptomatic but completely ambulatory  Vitals:   04/11/17 1528  BP: (!) 154/101  Pulse: 97  Resp: 20  Temp: 98.5 F (36.9 C)  SpO2: 97%   Filed Weights   04/11/17 1528  Weight: 168 lb 14.4 oz (76.6 kg)    GENERAL:alert, no distress and comfortable SKIN: skin color, texture, turgor are normal, no rashes or significant lesions EYES: normal, Conjunctiva are pink and non-injected, sclera clear OROPHARYNX:no exudate, no erythema and lips, buccal mucosa, and tongue normal  NECK: supple, thyroid normal size, non-tender, without nodularity LYMPH:  no palpable lymphadenopathy in the cervical, axillary or inguinal LUNGS: clear to auscultation and percussion with normal breathing effort HEART: regular  rate & rhythm and no murmurs and no lower extremity edema ABDOMEN:abdomen soft, non-tender and normal bowel sounds MUSCULOSKELETAL:no cyanosis of digits and no clubbing  NEURO: alert & oriented x 3 with fluent speech, no focal motor/sensory deficits EXTREMITIES: No lower extremity edema  LABORATORY DATA:  I have reviewed the data as listed   Chemistry      Component Value Date/Time   NA 139 04/04/2017 1416   K 3.7 04/04/2017 1416   CL 105 12/24/2016 0634   CO2 24 04/04/2017 1416   BUN 5.8 (L) 04/04/2017 1416   CREATININE 0.8 04/04/2017 1416      Component Value Date/Time   CALCIUM 8.6 04/04/2017 1416   ALKPHOS 131 04/04/2017 1416   AST 18 04/04/2017 1416   ALT 12 04/04/2017 1416   BILITOT 0.43 04/04/2017 1416       Lab Results  Component Value Date   WBC 4.4 04/04/2017   HGB 11.7 04/04/2017   HCT 35.9 04/04/2017   MCV 78.4 (L) 04/04/2017   PLT 207 04/04/2017   NEUTROABS 2.7 04/04/2017  ASSESSMENT & PLAN:  Breast cancer of lower-outer quadrant of right female breast ([place]) Right breast biopsy 12/03/2014 8:00: Invasive ductal carcinoma, grade 3, ER 0%, PR 0%, 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 Toenail and pelvic nodules consistent with metastatic disease, normal size nodules in the left axilla and left retropectoral region  CT chest abdomen pelvis  04/05/2017: Interval development of subcutaneous nodules in the ventral lower left pelvic wall and left flank compatible with subcutaneous metastases, small subcutaneous lower left back nodule stable, interval growth of 3 scattered nodular peritoneal metastases,   Recommendation: Systemic chemotherapy with Xeloda 2 weeks on 1 week off I discussed with her about the risks and benefits of chemotherapy Return to clinic in 2 weeks for toxicity check and lab work  We discussed several other treatment options including carboplatin and gemcitabine as well as [place].  We discussed the potential for future treatment options including Atezolizumab with Abraxane as well as [place] as well as androgen receptor antagonist as potential future treatment options.  I discussed with the patient that there is no role of radiation or surgery for the subcutaneous nodules.  I spent 45 minutes talking to the patient of which more than half was spent in counseling and coordination of care.  No orders of the defined types were placed in this encounter.  The patient has a good understanding of the overall plan. she agrees with it. she will call with any problems that may develop before the next visit here.   Rulon Eisenmenger, MD 04/11/17

## 2017-04-11 NOTE — Assessment & Plan Note (Signed)
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  Everolimus toxicities: 1. Rash on neck with itching.: Improved with cortisone cream.  CT chest abdomen pelvis 04/05/2017: Interval development of subcutaneous nodules in the ventral lower left pelvic wall and left flank compatible with subcutaneous metastases, small subcutaneous lower left back nodule stable, interval growth of 3 scattered nodular peritoneal metastases,   Recommendation: Systemic chemotherapy with [place]

## 2017-04-15 ENCOUNTER — Telehealth: Payer: Self-pay | Admitting: Pharmacist

## 2017-04-15 ENCOUNTER — Telehealth: Payer: Self-pay | Admitting: Pharmacy Technician

## 2017-04-15 DIAGNOSIS — Z171 Estrogen receptor negative status [ER-]: Principal | ICD-10-CM

## 2017-04-15 DIAGNOSIS — C50511 Malignant neoplasm of lower-outer quadrant of right female breast: Secondary | ICD-10-CM

## 2017-04-15 MED ORDER — CAPECITABINE 500 MG PO TABS: 850.0000 mg/m2 | ORAL_TABLET | Freq: Two times a day (BID) | ORAL | 3 refills | Status: DC

## 2017-04-15 NOTE — Telephone Encounter (Signed)
Oral Oncology Patient Advocate Encounter  Received notification from [facility] that prior authorization for Xeloda is required.  PA submitted on CoverMyMeds Key [id number] Status is pending  Oral Oncology Clinic will continue to follow.  Fabio Asa. Melynda Keller, [facility] [place] [phone] 04/15/2017 9:33 AM

## 2017-04-15 NOTE — Telephone Encounter (Signed)
Oral Oncology Pharmacist Encounter  Received new prescription for Xeloda (capeciatbine) for the treatment of triple negative, metastatic breast cancer, planned duration until disease progression or unacceptable toxicity.  Labs from 04/04/17 assessed, OK for treatment.  Current medication list in Epic reviewed, no DDIs with Xeloda identified.  Prescription has been e-scribed to [facility] for benefits analysis and approval per insurance requirement. Prior authorization is required and will be submitted by the office.   I spoke with patient for overview of: Xeloda.  Pt is doing well. Counseled patient on administration, dosing, side effects, monitoring, drug-food interactions, safe handling, storage, and disposal.  Patient will take Xeloda 500mg  tablets, 3 tablets (1500mg ) by mouth in AM and 3 tabs (1500mg ) by mouth in PM, within 30 minutes of finishing meals, on days 1-14 of each 21 day cycle. Xeloda start date: pending medication acquisition  Side effects of Xeloda include but not limited to: fatigue, decreased blood counts, GI upset, diarrhea, and hand-foot syndrome. Patient has loperamide at home and will call the office if diarrhea develops.    Reviewed with patient importance of keeping a medication schedule and plan for any missed doses.  [name] voiced understanding and appreciation.   All questions answered. Medication reconciliation performed and medication/allergy list updated.  Patient updated regarding insurance and pharmacy.  She will call the office when she receives the Xeloda to let us know when she has started.  Patient knows to call the office with questions or concerns. Oral Oncology Clinic will continue to follow.  Thank you,  Johny Drilling, PharmD, BCPS, BCOP 04/15/2017   9:28 AM Oral Oncology Clinic [phone]

## 2017-04-15 NOTE — Telephone Encounter (Signed)
Oral Oncology Patient Advocate Encounter  Prior Authorization for Xeloda has been approved.    Effective dates: 04/15/2017 through 04/15/2018  Oral Oncology Clinic will continue to follow.   Fabio Asa. Melynda Keller, [place] Patient [place] [phone] 04/15/2017 11:28 AM

## 2017-04-18 ENCOUNTER — Telehealth: Payer: Self-pay | Admitting: Hematology and Oncology

## 2017-04-18 NOTE — Telephone Encounter (Signed)
Not able to reach patient or leave message re 12/3 lab. Schedule mailed.

## 2017-04-25 ENCOUNTER — Other Ambulatory Visit: Payer: BLUE CROSS/BLUE SHIELD

## 2017-04-30 ENCOUNTER — Other Ambulatory Visit: Payer: Self-pay | Admitting: Hematology and Oncology

## 2017-04-30 DIAGNOSIS — C50511 Malignant neoplasm of lower-outer quadrant of right female breast: Secondary | ICD-10-CM

## 2017-05-19 NOTE — Progress Notes (Signed)
Kaitlyn Keith. 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   Headache: Pain: Dizziness: Nausea/vomiting: Ringing in ears: Visual changes (Blurred/ diplopia double vision,blind spots, and peripheral vsion changes): Fatigue: Cognitive changes: Weight:

## 2017-05-26 ENCOUNTER — Ambulatory Visit
Admission: RE | Admit: 2017-05-26 | Discharge: 2017-05-26 | Disposition: A | Discharge status: Home / Self Care | Payer: BLUE CROSS/BLUE SHIELD | Source: Ambulatory Visit | Attending: Radiation Oncology | Admitting: Radiation Oncology

## 2017-05-26 DIAGNOSIS — C7949 Secondary malignant neoplasm of other parts of nervous system: Principal | ICD-10-CM

## 2017-05-26 DIAGNOSIS — C7931 Secondary malignant neoplasm of brain: Secondary | ICD-10-CM

## 2017-05-30 ENCOUNTER — Ambulatory Visit: Payer: Self-pay | Admitting: Radiation Oncology

## 2017-05-31 ENCOUNTER — Inpatient Hospital Stay
Admission: RE | Admit: 2017-05-31 | Discharge: 2017-05-31 | Disposition: A | Discharge status: Home / Self Care | Payer: Self-pay | Source: Ambulatory Visit | Attending: Urology | Admitting: Urology

## 2017-06-02 ENCOUNTER — Ambulatory Visit
Admission: RE | Admit: 2017-06-02 | Discharge: 2017-06-02 | Disposition: A | Discharge status: Home / Self Care | Payer: BLUE CROSS/BLUE SHIELD | Source: Ambulatory Visit | Attending: Radiation Oncology | Admitting: Radiation Oncology

## 2017-06-02 MED ORDER — GADOBENATE DIMEGLUMINE 529 MG/ML IV SOLN
15.0000 mL | Freq: Once | INTRAVENOUS | Status: AC | PRN
  Administered 2017-06-02: 15 mL via INTRAVENOUS

## 2017-06-02 NOTE — Progress Notes (Signed)
Kaitlyn Keith. 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   Headache:No Pain:Crook in neck past 5-6 days taking Advil with little relief. Dizziness:No Nausea/vomiting:No Ringing in ears:No Visual changes (Blurred/ diplopia double vision,blind spots, and peripheral vsion changes):Reports blurred vision at times when reading and working. Fatigue:Yes most of the time. Cognitive changes: Alert and oriented x 3 with fluent speech,able to complete sentences without difficulty with word finding or organization of sentences.  Reports feeling forgetful with her short term memory. Weight: Wt Readings from Last 3 Encounters:  04/11/17 168 lb 14.4 oz (76.6 kg)  02/21/17 173 lb 3.2 oz (78.6 kg)  02/01/17 177 lb 9.6 oz (80.6 kg)  .BP (!) 153/106 (BP Location: Left Arm, Patient Position: Sitting, Cuff Size: Large)   Pulse 77   Temp 97.7 F (36.5 C) (Oral)   Resp 18   Ht 5\' 3"  (1.6 m)   Wt 176 lb 9.6 oz (80.1 kg)   SpO2 93%   BMI 31.28 kg/m   Did not take her blood plressure medication this morning, plans to take the medication when she gets home.

## 2017-06-08 ENCOUNTER — Other Ambulatory Visit: Payer: Self-pay

## 2017-06-08 ENCOUNTER — Other Ambulatory Visit: Payer: Self-pay | Admitting: Urology

## 2017-06-08 ENCOUNTER — Ambulatory Visit
Admission: RE | Admit: 2017-06-08 | Discharge: 2017-06-08 | Disposition: A | Discharge status: Home / Self Care | Payer: BLUE CROSS/BLUE SHIELD | Source: Ambulatory Visit | Attending: Radiation Oncology | Admitting: Radiation Oncology

## 2017-06-08 ENCOUNTER — Encounter: Payer: Self-pay | Admitting: Urology

## 2017-06-08 VITALS — BP 153/106 | HR 77 | Temp 97.7°F | Resp 18 | Ht 63.0 in | Wt 176.6 lb

## 2017-06-08 DIAGNOSIS — S161XXA Strain of muscle, fascia and tendon at neck level, initial encounter: Secondary | ICD-10-CM | POA: Diagnosis not present

## 2017-06-08 DIAGNOSIS — F329 Major depressive disorder, single episode, unspecified: Secondary | ICD-10-CM | POA: Diagnosis not present

## 2017-06-08 DIAGNOSIS — Z79899 Other long term (current) drug therapy: Secondary | ICD-10-CM | POA: Diagnosis not present

## 2017-06-08 DIAGNOSIS — Y93H1 Activity, digging, shoveling and raking: Secondary | ICD-10-CM | POA: Insufficient documentation

## 2017-06-08 DIAGNOSIS — Z171 Estrogen receptor negative status [ER-]: Secondary | ICD-10-CM | POA: Diagnosis not present

## 2017-06-08 DIAGNOSIS — C7931 Secondary malignant neoplasm of brain: Secondary | ICD-10-CM | POA: Insufficient documentation

## 2017-06-08 DIAGNOSIS — F419 Anxiety disorder, unspecified: Secondary | ICD-10-CM | POA: Diagnosis not present

## 2017-06-08 DIAGNOSIS — C50511 Malignant neoplasm of lower-outer quadrant of right female breast: Secondary | ICD-10-CM | POA: Insufficient documentation

## 2017-06-08 MED ORDER — CYCLOBENZAPRINE HCL 5 MG PO TABS: 5.0000 mg | ORAL_TABLET | Freq: Three times a day (TID) | ORAL | 0 refills | Status: DC | PRN

## 2017-06-08 NOTE — Progress Notes (Signed)
Radiation Oncology         [phone] ________________________________  Name: Kaitlyn Keith MRN: [id number]  Date: 06/08/2017  DOB: 1965-06-17  Follow-Up Visit Note  CC: Secundino Ginger, PA-C  Secundino Ginger, PA-C  Diagnosis:    52 y.o. 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        ICD-10-CM   1. 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 is a pleasant 52 y.o. 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 She was diagnosed with her cancer in July 2016, and underwent neoadjuvant chemotherapy followed by right lumpectomy and adjuvant radiation. She was found to have recurrent disease in February 2018 with a right cerebellar mass, and underwent preoperative SRS treatment followed by surgical resection on 07/29/2016. She is being followed in our multidisciplinary brain oncology conference, and her last MRI scan on 06/02/17 revealed stability of her previously treated site of disease, with stability of her resection cavity, and no new disease. She comes today to review these findings.  On review of systems, the patient reports that she is doing well overall.  She has had pain in her right neck and into the base of her skull which she attributes to a pulled muscle from raking leaves in the yard last week.  She was not having issues prior to her yard work and the pain has been persistent since that time.  Otherwise, she denies headaches, dizziness, imbalance, tinnitus, tremor, seizure activity or changes in her visual or auditory acuity. She denies any chest pain, shortness of breath, cough, fevers, chills, night sweats, unintended weight  changes. She denies any bowel or bladder disturbances, and denies abdominal pain, nausea or vomiting. She denies any new musculoskeletal or joint aches or pains, new skin lesions or concerns. A complete review of systems is obtained and is otherwise negative.  Past Medical History:  Past Medical History:  Diagnosis Date  . Anxiety   . Arthritis   . Back pain   . Brain cancer ([place])   . Breast cancer ([place])   . Breast cancer of lower-outer quadrant of right female breast ([place]) 12/05/2014  . Depression   . FH: chemotherapy 12/2014-04/2015  . Genetic testing 12/10/2016   [name] underwent genetic counseling and testing for hereditary cancer syndromes on 11/18/2016. 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 Mutations in MSH6 are associated with a hereditary cancer syndrome called Lynch syndrome. For more detailed discussion, please see genetic counseling documentation from 12/10/2016.  Testing was perfo  . Headache    due to brain cancer, no longer having them  . Hot flashes   . Hypertension   . 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 underwent genetic counseling and testing for hereditary cancer syndromes on 11/18/2016. 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 Mutations in MSH6 are associated with a hereditary cancer syndrome called Lynch syndrome. For more detailed discussion, please see genetic counseling documentation from 12/10/2016.  Testing was perfo  . 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 Radiation  07/23/15-09/05/15   right breast 50.4 Gy, boost of 10 Gy    Past Surgical History: Past Surgical History:  Procedure Laterality Date  . APPLICATION OF CRANIAL NAVIGATION Right 07/27/2016   Procedure: APPLICATION OF CRANIAL NAVIGATION;  Surgeon: Kevan Ny Ditty, MD;  Location: [facility];  Service: Neurosurgery;  Laterality:  Right;  . BIOPSY OF SKIN SUBCUTANEOUS TISSUE AND/OR MUCOUS MEMBRANE Left 12/24/2016   Procedure: OPEN BIOPSY LESIONS ON LEFT LOWER BACK AND SHOULDER BLADE;  Surgeon: [name], MD;  Location: [facility];  Service: General;  Laterality: Left;  . BREAST LUMPECTOMY WITH NEEDLE LOCALIZATION AND AXILLARY SENTINEL LYMPH NODE BX Right 06/16/2015   Procedure: BREAST LUMPECTOMY WITH NEEDLE LOCALIZATION AND AXILLARY SENTINEL LYMPH NODE BX;  Surgeon: [name], MD;  Location: [facility];  Service: General;  Laterality: Right;  . BREAST REDUCTION SURGERY    . CRANIOTOMY Right 07/27/2016   Procedure: Right Suboccipital craniotomy for tumor resection with stereotactic navigation;  Surgeon: Kevan Ny Ditty, MD;  Location: [facility];  Service: Neurosurgery;  Laterality: Right;  . PORT-A-CATH REMOVAL N/A 06/16/2015   Procedure: REMOVAL PORT-A-CATH;  Surgeon: [name], MD;  Location: [facility];  Service: General;  Laterality: N/A;  . PORTACATH PLACEMENT N/A 12/23/2014   Procedure: INSERTION PORT-A-CATH;  Surgeon: [name], MD;  Location: [facility];  Service: General;  Laterality: N/A;    Social History:  Social History   Socioeconomic History  . Marital status: Divorced    Spouse name: Not on file  . Number of children: 1  . Years of education: Not on file  . Highest education level: Not on file  Social Needs  . Financial resource strain: Not on file  . Food insecurity - worry: Not on file  . Food insecurity - inability: Not on file  . Transportation needs - medical: Not on file  . Transportation needs - non-medical: Not on file  Occupational History  . Not on file  Tobacco Use  . Smoking status: Never Smoker  . Smokeless tobacco: Never Used  Substance and Sexual Activity  . Alcohol use: Yes    Comment: social  . Drug use: No  . Sexual activity: Yes  Other Topics Concern  . Not on file  Social History Narrative  . Not on file  The patient  is divorced. She has worked for [employer] firm, and is accompanied today by her sister  Family History: Family History  Problem Relation Age of Onset  . Aneurysm Mother 7       d.55  . Heart attack Father 78       d.62  . Endometrial cancer Sister 42  . Lung cancer Maternal Uncle        d.68s  . Cancer Maternal Grandmother        unspecified type-possibly stomach d.88s                          ALLERGIES:  has No Known Allergies.  Meds: Current Outpatient Medications  Medication Sig Dispense Refill  . ALPRAZolam (XANAX) 1 MG tablet Take 2 tablets (2 mg total) by mouth at bedtime. 60 tablet 0  . capecitabine (XELODA) 500 MG tablet Take 3 tablets (1,500 mg total) by mouth 2 (two) times daily after a meal. Take for 14 days on, 7 days off, repeat every 21 days. 84 tablet 3  . lisinopril-hydrochlorothiazide (PRINZIDE,ZESTORETIC) 20-12.5 MG tablet TAKE  1 TABLET BY MOUTH TWICE DAILY 180 tablet 0  . traZODone (DESYREL) 100 MG tablet Take 1 tablet by mouth daily.    . Vitamin D, Ergocalciferol, (DRISDOL) 50000 UNITS CAPS capsule Take 1 capsule (50,000 Units total) by mouth every 7 (seven) days. 12 capsule 3  . cyclobenzaprine (FLEXERIL) 5 MG tablet Take 1 tablet (5 mg total) by mouth 3 (three) times daily as needed for muscle spasms. 30 tablet 0  . ondansetron (ZOFRAN) 8 MG tablet Take 1 tablet (8 mg total) every 8 (eight) hours as needed by mouth for nausea. (Patient not taking: Reported on 06/08/2017) 30 tablet 3   No current facility-administered medications for this encounter.     Physical Findings:   height is '5\' 3"'  (1.6 m) and weight is 176 lb 9.6 oz (80.1 kg). Her oral temperature is 97.7 F (36.5 C). Her blood pressure is 153/106 (abnormal) and her pulse is 77. Her respiration is 18 and oxygen saturation is 93%.   Pain Assessment Pain Score: 7  Pain Loc: Neck/10  In general this is a well appearing African American female in no acute distress. She's alert and oriented  x4 and appropriate throughout the examination. Cardiopulmonary assessment is negative for acute distress and she exhibits normal effort. There is some tightness and tenderness to palpation over the right trapezius muscle.  She appears grossly neurologically intact with PERRLA and EOMs intact bilaterally. Sensation is intact to light touch and strength is 5/5 and equal bilaterally in the upper and lower extremities.  She walks with a normal gait.   Lab Findings: Lab Results  Component Value Date   WBC 4.4 04/04/2017   WBC 4.9 12/24/2016   HGB 11.7 04/04/2017   HCT 35.9 04/04/2017   PLT 207 04/04/2017    Lab Results  Component Value Date   NA 139 04/04/2017   K 3.7 04/04/2017   CHLORIDE 108 04/04/2017   CO2 24 04/04/2017   GLUCOSE 76 04/04/2017   BUN 5.8 (L) 04/04/2017   CREATININE 0.8 04/04/2017   BILITOT 0.43 04/04/2017   ALKPHOS 131 04/04/2017   AST 18 04/04/2017   ALT 12 04/04/2017   PROT 7.0 04/04/2017   ALBUMIN 3.0 (L) 04/04/2017   CALCIUM 8.6 04/04/2017   ANIONGAP 7 04/04/2017   ANIONGAP 7 12/24/2016    Radiographic Findings: Mr Jeri Cos LA Contrast  Result Date: 06/02/2017 CLINICAL DATA:  Metastatic breast cancer. Follow-up treatment of intracranial metastatic disease. Creatinine was obtained on site at [facility] at [address] Results: Creatinine 0.7 mg/dL. EXAM: MRI HEAD WITHOUT AND WITH CONTRAST TECHNIQUE: Multiplanar, multiecho pulse sequences of the brain and surrounding structures were obtained without and with intravenous contrast. CONTRAST:  17m MULTIHANCE GADOBENATE DIMEGLUMINE 529 MG/ML IV SOLN COMPARISON:  MRI 02/17/2017 FINDINGS: Brain: Treated lesion in the right cerebellum is stable. No recurrent tumor or abnormal enhancement right cerebellum. No new metastatic deposits identified. Negative for acute infarct or hemorrhage. No midline shift. Ventricle size remains normal. Vascular: Normal arterial flow voids Skull and upper cervical spine: No  worrisome skull lesions. Chronic anterolisthesis C2-3 is unchanged. Abnormal bone marrow at C4 and C5 is unchanged and could be degenerative versus metastatic disease. No definite metastatic disease on prior CT of 07/12/2016. Sinuses/Orbits: Mild mucosal edema paranasal sinuses. Negative for orbital mass Other: None IMPRESSION: Treated tumor right cerebellum is stable without recurrence. No evidence of metastatic disease in the brain Abnormal bone marrow at the C4 and C5 vertebral bodies which could be degenerative  versus metastatic disease. This is stable from prior studies. Electronically Signed   By: Franchot Gallo M.D.   On: 06/02/2017 08:55    Impression/Plan: 1. 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 Patient appears to be clinically and radiographically stable regarding her brain disease.  Her MRIs were reviewed from multidisciplinary brain conference, and we would recommend she continue with repeat MRI in 3 months time to monitor for any evidence of disease progression or recurrence. She is in agreement with this plan. Unfortunately, her recent systemic imaging indicated disease progression with multiple subcutaneous nodular mets and peritoneal metastases.  She will continue in follow up with Dr. Lindi Adie for management of her systemic therapy- currently on Xeloda daily, 2 weeks on and 1 week off.  2. Discomfort at surgical site/right neck strain. She continues with intermittent discomfort at the surgical site but more recently, developed persistent discomfort in the right neck into the base of the skull on the right after raking leaves in her yard all day.  She has not tried taking any medication but reports that it feels like a muscle strain.  I have prescribed Flexeril 77m po TID prn.  She understands that she should follow up with her PCP if symptoms do not improve or progress despite treatment.     ANicholos Johns  PA-C

## 2017-06-16 ENCOUNTER — Telehealth: Payer: Self-pay | Admitting: *Deleted

## 2017-06-16 NOTE — Telephone Encounter (Signed)
Schedule and confirmed lab and f/u with Dr. Lindi Adie on 1/28 at 2pm

## 2017-06-17 ENCOUNTER — Other Ambulatory Visit: Payer: Self-pay

## 2017-06-17 DIAGNOSIS — G620 Drug-induced polyneuropathy: Secondary | ICD-10-CM

## 2017-06-17 DIAGNOSIS — T451X5A Adverse effect of antineoplastic and immunosuppressive drugs, initial encounter: Principal | ICD-10-CM

## 2017-06-19 NOTE — Assessment & Plan Note (Signed)
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 Toenail and pelvic nodules consistent with metastatic disease, normal size nodules in the left axilla and left retropectoral region  CT chest abdomen pelvis 04/05/2017: Interval development of subcutaneous nodules in the ventral lower left pelvic wall and left flank compatible with subcutaneous metastases, small subcutaneous lower left back nodule stable, interval growth of 3 scattered nodular peritoneal metastases,   Recommendation: Systemic chemotherapy with Xeloda 2 weeks on 1 week off   

## 2017-06-20 ENCOUNTER — Telehealth: Payer: Self-pay | Admitting: Hematology and Oncology

## 2017-06-20 ENCOUNTER — Inpatient Hospital Stay (HOSPITAL_BASED_OUTPATIENT_CLINIC_OR_DEPARTMENT_OTHER): Payer: BLUE CROSS/BLUE SHIELD | Admitting: Hematology and Oncology

## 2017-06-20 ENCOUNTER — Inpatient Hospital Stay: Payer: BLUE CROSS/BLUE SHIELD | Attending: Hematology and Oncology

## 2017-06-20 ENCOUNTER — Other Ambulatory Visit: Payer: Self-pay | Admitting: Hematology and Oncology

## 2017-06-20 DIAGNOSIS — C786 Secondary malignant neoplasm of retroperitoneum and peritoneum: Secondary | ICD-10-CM | POA: Diagnosis not present

## 2017-06-20 DIAGNOSIS — Z171 Estrogen receptor negative status [ER-]: Principal | ICD-10-CM

## 2017-06-20 DIAGNOSIS — C50511 Malignant neoplasm of lower-outer quadrant of right female breast: Secondary | ICD-10-CM | POA: Insufficient documentation

## 2017-06-20 DIAGNOSIS — C7931 Secondary malignant neoplasm of brain: Secondary | ICD-10-CM | POA: Diagnosis not present

## 2017-06-20 DIAGNOSIS — C792 Secondary malignant neoplasm of skin: Secondary | ICD-10-CM | POA: Insufficient documentation

## 2017-06-20 DIAGNOSIS — Z923 Personal history of irradiation: Secondary | ICD-10-CM

## 2017-06-20 DIAGNOSIS — T451X5A Adverse effect of antineoplastic and immunosuppressive drugs, initial encounter: Secondary | ICD-10-CM

## 2017-06-20 DIAGNOSIS — Z9221 Personal history of antineoplastic chemotherapy: Secondary | ICD-10-CM | POA: Diagnosis not present

## 2017-06-20 DIAGNOSIS — G620 Drug-induced polyneuropathy: Secondary | ICD-10-CM

## 2017-06-20 LAB — CBC WITH DIFFERENTIAL (CANCER CENTER ONLY)
BASOS PCT: 0 %
Basophils Absolute: 0 10*3/uL (ref 0.0–0.1)
EOS ABS: 0.1 10*3/uL (ref 0.0–0.5)
Eosinophils Relative: 1 %
HCT: 39.4 % (ref 34.8–46.6)
Hemoglobin: 13.4 g/dL (ref 11.6–15.9)
LYMPHS ABS: 1.7 10*3/uL (ref 0.9–3.3)
Lymphocytes Relative: 34 %
MCH: 29.8 pg (ref 25.1–34.0)
MCHC: 34.1 g/dL (ref 31.5–36.0)
MCV: 87.3 fL (ref 79.5–101.0)
MONO ABS: 0.4 10*3/uL (ref 0.1–0.9)
MONOS PCT: 8 %
Neutro Abs: 2.8 10*3/uL (ref 1.5–6.5)
Neutrophils Relative %: 57 %
Platelet Count: 204 10*3/uL (ref 145–400)
RBC: 4.51 MIL/uL (ref 3.70–5.45)
RDW: 28 % — AB (ref 11.2–16.1)
WBC Count: 4.9 10*3/uL (ref 3.9–10.3)

## 2017-06-20 LAB — CMP (CANCER CENTER ONLY)
ALBUMIN: 3.6 g/dL (ref 3.5–5.0)
ALK PHOS: 99 U/L (ref 40–150)
ALT: 8 U/L (ref 0–55)
ANION GAP: 7 (ref 3–11)
AST: 16 U/L (ref 5–34)
BUN: 11 mg/dL (ref 7–26)
CALCIUM: 9.2 mg/dL (ref 8.4–10.4)
CO2: 26 mmol/L (ref 22–29)
Chloride: 106 mmol/L (ref 98–109)
Creatinine: 0.71 mg/dL (ref 0.60–1.10)
GFR, Estimated: >60 mL/min (ref >60)
GLUCOSE: 86 mg/dL (ref 70–140)
POTASSIUM: 3.5 mmol/L (ref 3.3–4.7)
Sodium: 139 mmol/L (ref 136–145)
Total Bilirubin: 0.5 mg/dL (ref 0.2–1.2)
Total Protein: 6.9 g/dL (ref 6.4–8.3)

## 2017-06-20 MED ORDER — CAPECITABINE 500 MG PO TABS: 2000.0000 mg | ORAL_TABLET | Freq: Two times a day (BID) | ORAL | 3 refills | Status: DC

## 2017-06-20 NOTE — Telephone Encounter (Signed)
Gave patient AVs and calendar of upcoming February appointments.  °

## 2017-06-20 NOTE — Progress Notes (Signed)
Patient Care Team: Gwendel Hanson as PCP - General (Cardiology) [name], [name], [name], [name], [name], [name], NP as Nurse Practitioner (Nurse Practitioner)  DIAGNOSIS:  Encounter Diagnosis  Name Primary?  . 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, estrogen receptor negative ([place])     SUMMARY OF ONCOLOGIC HISTORY:   Breast cancer of lower-outer quadrant of right female breast ([place])   12/03/2014 Mammogram    Right breast mass 1.9 cm it o'clock position 8 cm depth from the nipple      12/03/2014 Initial Diagnosis    Right breast biopsy 8:00: Invasive ductal carcinoma, grade 3, ER 0%, PR 0%, Ki-67 90%, HER-2 negative ratio 1.43      12/10/2014 Breast MRI    Right breast lower outer quadrant: 2.3 x 2.4 x 2.4 cm rim-enhancing mass abuts the pectoralis fascia but no enhancement of pectoralis muscle, second focus of artifact?'s second tissue marker clip, no lymph nodes      12/10/2014 Clinical Stage    Stage IIA: T2 N0      12/24/2014 - 04/29/2015 Neo-Adjuvant Chemotherapy    Dose dense Adriamycin and Cytoxan 4 followed by weekly Abraxane 12      05/02/2015 Breast MRI    complete radiologic response      06/16/2015 Surgery    Left Lumpectomy: Complete path Response, 0/2 LN      06/16/2015 Pathologic Stage    ypT0 ypN0      07/23/2015 - 09/05/2015 Radiation Therapy    Adjuvant RT: 50.4 Gy in 28 fractions and a boost of 10 Gy in 5 fractions to total dose of 60.4 Gy      10/24/2015 Survivorship    SCP mailed to patient in lieu of in person visit.      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  She is taking 1500 p.o. twice daily and does not seem to have any side effects.  She does not have any nausea vomiting or fatigue.  She has not had any diarrhea or hand-foot syndrome.  She is very religious about taking the medication.  Patient informed me that she feels that  the groin node is getting bigger as well as the left posterior subcutaneous nodule is also getting bigger.  REVIEW OF SYSTEMS:   Constitutional: Denies fevers, chills or abnormal weight loss Eyes: Denies blurriness of vision Ears, nose, mouth, throat, and face: Denies mucositis or sore throat Respiratory: Denies cough, dyspnea or wheezes Cardiovascular: Denies palpitation, chest discomfort Gastrointestinal:  Denies nausea, heartburn or change in bowel habits Skin: Denies abnormal skin rashes Lymphatics: Inguinal lymph node getting larger Neurological:Denies numbness, tingling or new weaknesses Behavioral/Psych: Mood is  stable, no new changes  Extremities: No lower extremity edema  All other systems were reviewed with the patient and are negative.  I have reviewed the past medical history, past surgical history, social history and family history with the patient and they are unchanged from previous note.  ALLERGIES:  has No Known Allergies.  MEDICATIONS:  Current Outpatient Medications  Medication Sig Dispense Refill  . ALPRAZolam (XANAX) 1 MG tablet Take 2 tablets (2 mg total) by mouth at bedtime. 60 tablet 0  . capecitabine (XELODA) 500 MG tablet Take 4 tablets (2,000 mg total) by mouth 2 (two) times daily after a meal. Take for 14 days on, 7 days off, repeat every 21 days. 112 tablet 3  . cyclobenzaprine (FLEXERIL) 5 MG tablet Take 1 tablet (5 mg total) by mouth 3 (three) times daily as needed for muscle spasms. 30 tablet 0  . lisinopril-hydrochlorothiazide (PRINZIDE,ZESTORETIC) 20-12.5 MG tablet TAKE 1 TABLET BY MOUTH TWICE DAILY 180 tablet 0  . ondansetron (ZOFRAN) 8 MG tablet Take 1 tablet (8 mg total) every 8 (eight) hours as needed by mouth for nausea. (Patient not taking: Reported on 06/08/2017) 30 tablet 3  . traZODone (DESYREL) 100 MG tablet Take 1 tablet by mouth daily.    . Vitamin D, Ergocalciferol, (DRISDOL) 50000 UNITS CAPS capsule Take 1 capsule (50,000 Units total) by mouth every 7 (seven) days. 12 capsule 3   No current facility-administered medications for this visit.     PHYSICAL EXAMINATION: ECOG PERFORMANCE STATUS: 1 - Symptomatic but completely ambulatory  Vitals:   06/20/17 1420  BP: (!) 171/114  Pulse: 74  Resp: 17  Temp: 97.6 F (36.4 C)  SpO2: 100%   Filed Weights   06/20/17 1420  Weight: 178 lb 6.4 oz (80.9 kg)    GENERAL:alert, no distress and comfortable SKIN: skin color, texture, turgor are normal, no rashes or significant lesions EYES: normal, Conjunctiva are pink and non-injected, sclera clear OROPHARYNX:no exudate, no erythema and lips, buccal mucosa, and  tongue normal  NECK: supple, thyroid normal size, non-tender, without nodularity LYMPH:  no palpable lymphadenopathy in the cervical, axillary or inguinal LUNGS: clear to auscultation and percussion with normal breathing effort HEART: regular rate & rhythm and no murmurs and no lower extremity edema ABDOMEN:abdomen soft, non-tender and normal bowel sounds MUSCULOSKELETAL:no cyanosis of digits and no clubbing  NEURO: alert & oriented x 3 with fluent speech, no focal motor/sensory deficits EXTREMITIES: No lower extremity edema   LABORATORY DATA:  I have reviewed the data as listed CMP Latest Ref Rng & Units 06/20/2017 04/04/2017 02/01/2017  Glucose 70 - 140 mg/dL 86 76 88  BUN 7 - 26 mg/dL 11 5.8(L) 7.4  Creatinine 0.6 - 1.1 mg/dL - 0.8 0.7  Sodium 136 - 145 mmol/L 139 139 139  Potassium 3.3 - 4.7 mmol/L 3.5 3.7 3.6  Chloride 98 - 109 mmol/L 106 - -  CO2 22 - 29 mmol/L _0 Calcium 8.4 - 10.4  mg/dL 9.2 8.6 9.0  Total Protein 6.4 - 8.3 g/dL 6.9 7.0 6.5  Total Bilirubin 0.2 - 1.2 mg/dL 0.5 0.43 0.36  Alkaline Phos 40 - 150 U/L 99 131 97  AST 5 - 34 U/L _0 ALT 0 - 55 U/L _1 Lab Results  Component Value Date   WBC 4.9 06/20/2017   HGB 11.7 04/04/2017   HCT 39.4 06/20/2017   MCV 87.3 06/20/2017   PLT 204 06/20/2017   NEUTROABS 2.8 06/20/2017    ASSESSMENT & PLAN:  Breast cancer of lower-outer quadrant of right female breast ([place]) Right breast biopsy 12/03/2014 8:00: Invasive ductal carcinoma, grade 3, ER 0%, PR 0%, 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 Neoadj chemo 12/24/14- 04/29/15 AC x 4 foll by Abraxane X 12 Rt Lumpectomy: Path CR 0/2 LN Adj XRT 07/23/15- 09/08/15  Cerebellar mass diagnosed 07/12/2016: Right suboccipital craniotomy for tumor resection with stereotactic navigation 07/29/16: Metastatic poorly differentiated adenocarcinoma with  extensive necrosis positive for CK 7, MOC 31, CK 5/6; Neg for Er/PR, GATA-3, GCDFP CDX2, Napsin A and TTF-1  PET/CT scan 11/17/2016:Subcutaneous nodules in the neck, upper back, left arm, abdomen and pelvis,. Toenail and pelvic nodules consistent with metastatic disease, normal size nodules in the left axilla and left retropectoral region  CT chest abdomen pelvis 04/05/2017: Interval development of subcutaneous nodules in the ventral lower left pelvic wall and left flank compatible with subcutaneous metastases, small subcutaneous lower left back nodule stable, interval growth of 3 scattered nodular peritoneal metastases,   Recommendation: Systemic chemotherapy with Xeloda 2 weeks on 1 week off Since the patient is tolerating Xeloda extremely well, I will increase the dosage to 2000 mg p.o. twice daily starting 06/20/2017. Because the patient feels of the inguinal lymph node is getting larger, I would like to obtain CT chest abdomen pelvis followed up after that in 1 week.    I spent 25 minutes talking to the patient of which more than half was spent in counseling and coordination of care.  Orders Placed This Encounter  Procedures  . CT Abdomen Pelvis W Contrast    Standing Status:   Future    Standing Expiration Date:   06/20/2018    Order Specific Question:   If indicated for the ordered procedure, I authorize the administration of contrast media per Radiology protocol    Answer:   Yes    Order Specific Question:   Is patient pregnant?    Answer:   No    Order Specific Question:   Preferred imaging location?    Answer:   [facility]    Order Specific Question:   Radiology Contrast Protocol - do NOT remove file path    Answer:   [email or web address]    Order Specific Question:   Reason for Exam additional comments    Answer:   Metastatic Breast cancer restaging  . CT Chest W Contrast    Standing Status:   Future    Standing Expiration Date:    06/20/2018    Order Specific Question:   If indicated for the ordered procedure, I authorize the administration of contrast media per Radiology protocol    Answer:   Yes    Order Specific Question:   Is patient pregnant?    Answer:   No  Order Specific Question:   Preferred imaging location?    Answer:   [facility]    Order Specific Question:   Radiology Contrast Protocol - do NOT remove file path    Answer:   [email or web address]    Order Specific Question:   Reason for Exam additional comments    Answer:   Metastatic Breast cancer restaging   The patient has a good understanding of the overall plan. she agrees with it. she will call with any problems that may develop before the next visit here.   Harriette Ohara, MD 06/20/17

## 2017-06-27 ENCOUNTER — Ambulatory Visit (HOSPITAL_COMMUNITY)
Admission: RE | Admit: 2017-06-27 | Discharge: 2017-06-27 | Disposition: A | Discharge status: Home / Self Care | Payer: BLUE CROSS/BLUE SHIELD | Source: Ambulatory Visit | Attending: Hematology and Oncology | Admitting: Hematology and Oncology

## 2017-06-27 DIAGNOSIS — R590 Localized enlarged lymph nodes: Secondary | ICD-10-CM | POA: Insufficient documentation

## 2017-06-27 DIAGNOSIS — Z171 Estrogen receptor negative status [ER-]: Secondary | ICD-10-CM | POA: Insufficient documentation

## 2017-06-27 DIAGNOSIS — C50511 Malignant neoplasm of lower-outer quadrant of right female breast: Secondary | ICD-10-CM | POA: Diagnosis not present

## 2017-06-27 MED ORDER — IOPAMIDOL (ISOVUE-300) INJECTION 61%
100.0000 mL | Freq: Once | INTRAVENOUS | Status: AC | PRN
  Administered 2017-06-27: 100 mL via INTRAVENOUS

## 2017-06-27 MED ORDER — IOPAMIDOL (ISOVUE-300) INJECTION 61%
INTRAVENOUS | Status: AC
  Filled 2017-06-27: qty 100

## 2017-06-29 ENCOUNTER — Other Ambulatory Visit: Payer: Self-pay | Admitting: Radiation Therapy

## 2017-06-29 DIAGNOSIS — C7931 Secondary malignant neoplasm of brain: Secondary | ICD-10-CM

## 2017-06-29 DIAGNOSIS — C7949 Secondary malignant neoplasm of other parts of nervous system: Principal | ICD-10-CM

## 2017-07-04 ENCOUNTER — Inpatient Hospital Stay: Payer: BLUE CROSS/BLUE SHIELD | Attending: Hematology and Oncology | Admitting: Hematology and Oncology

## 2017-07-04 ENCOUNTER — Ambulatory Visit: Payer: Self-pay | Admitting: General Surgery

## 2017-07-04 ENCOUNTER — Encounter: Payer: Self-pay | Admitting: *Deleted

## 2017-07-04 ENCOUNTER — Other Ambulatory Visit: Payer: Self-pay

## 2017-07-04 ENCOUNTER — Encounter: Payer: Self-pay | Admitting: Urology

## 2017-07-04 ENCOUNTER — Encounter: Payer: Self-pay | Admitting: Radiation Oncology

## 2017-07-04 ENCOUNTER — Encounter (HOSPITAL_BASED_OUTPATIENT_CLINIC_OR_DEPARTMENT_OTHER): Payer: Self-pay | Admitting: *Deleted

## 2017-07-04 VITALS — BP 128/93 | HR 86 | Temp 98.9°F | Resp 18 | Ht 63.0 in | Wt 176.7 lb

## 2017-07-04 DIAGNOSIS — Z7189 Other specified counseling: Secondary | ICD-10-CM | POA: Insufficient documentation

## 2017-07-04 DIAGNOSIS — R590 Localized enlarged lymph nodes: Secondary | ICD-10-CM | POA: Insufficient documentation

## 2017-07-04 DIAGNOSIS — Z1509 Genetic susceptibility to other malignant neoplasm: Secondary | ICD-10-CM

## 2017-07-04 DIAGNOSIS — C50511 Malignant neoplasm of lower-outer quadrant of right female breast: Secondary | ICD-10-CM | POA: Diagnosis not present

## 2017-07-04 DIAGNOSIS — D496 Neoplasm of unspecified behavior of brain: Secondary | ICD-10-CM

## 2017-07-04 DIAGNOSIS — G893 Neoplasm related pain (acute) (chronic): Secondary | ICD-10-CM | POA: Insufficient documentation

## 2017-07-04 DIAGNOSIS — C7931 Secondary malignant neoplasm of brain: Secondary | ICD-10-CM | POA: Insufficient documentation

## 2017-07-04 DIAGNOSIS — Z171 Estrogen receptor negative status [ER-]: Secondary | ICD-10-CM

## 2017-07-04 MED ORDER — PROCHLORPERAZINE MALEATE 10 MG PO TABS: 10.0000 mg | ORAL_TABLET | Freq: Four times a day (QID) | ORAL | 1 refills | Status: DC | PRN

## 2017-07-04 MED ORDER — LIDOCAINE-PRILOCAINE 2.5-2.5 % EX CREA: TOPICAL_CREAM | CUTANEOUS | 3 refills | Status: DC

## 2017-07-04 MED ORDER — ONDANSETRON HCL 8 MG PO TABS: 8.0000 mg | ORAL_TABLET | Freq: Two times a day (BID) | ORAL | 1 refills | Status: DC | PRN

## 2017-07-04 NOTE — Progress Notes (Signed)
Histology and Location of Primary Cancer:Painful metastatic disease subcutaneous tissues of the back,progressive subcutaneous nodules and pelvic nodule from cancer of the lower outer quadrant if the right breast   Location(s) of Symptomatic tumor(s): subcutaneous tissues of the back   Pelvic, central mesentery,left flank subcutaneous fat, superior aspect of the left vulvar region,right inguinal lymphadenopathy, anterior mediastinum.     Past/Anticipated chemotherapy by medical oncology, if any: 07-04-17 Dr. Gudena Based on the above findings, I recommended systemic chemotherapy with Halaven   IMPRESSION: 06-27-17  CT Chest  w contrast 1. Today's study demonstrates a mixed response to therapy. Specifically, while there has been some regression of previously noted intraperitoneal implants, other lesions have grown, most notable for enlargement of subcutaneous nodules in the left flank and superior aspect of the left vulvar region. There is also new right inguinal lymphadenopathy, and some increasingly prominent soft tissue in the anterior mediastinum which may represent malignant nodal tissue. 2. Parenchymal lung changes seen on the prior examination have largely resolved, with what appears to be areas of mild residual scarring, indicative of an infectious or inflammatory process on the prior examination. 3. Additional incidental findings, as above  IMPRESSION: 06-27-17 CT Abdomen pelvis w contrast 1. Today's study demonstrates a mixed response to therapy. Specifically, while there has been some regression of previously noted intraperitoneal implants, other lesions have grown, most notable for enlargement of subcutaneous nodules in the left flank and superior aspect of the left vulvar region. There is also new right inguinal lymphadenopathy, and some increasingly prominent soft tissue in the anterior mediastinum which may represent malignant nodal tissue. 2. Parenchymal lung  changes seen on the prior examination have largely resolved, with what appears to be areas of mild residual scarring, indicative of an infectious or inflammatory process on the prior examination. 3. Additional incidental findings, as above   05-24-17  07-04-2017 Systemic chemotherapy withXeloda 2 weeks on 1 week off stopped due to progression of disease based on CT scans done 06/27/2017    Patient's main complaints related to symptomatic tumor(s) are: Hand and feet sore and painful  Pain on a scale of 0-10 is: Reports left lower back nodule is "annoying" but not painful. Reports left groin nodule isn't painful at all. Reports both nodules are growing rapidly. Left low back nodule palpates to the size of a clementine. Left low back nodule is deep. Left groin nodule is slightly smaller than left low back nodule but, more superficial.   If Spine Met(s), symptoms, if any, include:  Bowel/Bladder retention or incontinence (please describe): denies  Numbness or weakness in extremities (please describe): denies  Current Decadron regimen, if applicable: No  Ambulatory status? Walker? Wheelchair?: ambulatory  SAFETY ISSUES: Prior radiation? 07-26-16 Right Cerebellum/ 18 Gy in 1 fraction                           Right breast received 50.4 Gy in 28 fractions and a boost of 10 Gy in 5 fractions to total dose of 60.4 Gy      Pacemaker/ICD? No  Possible current pregnancy? : No   Is the patient on methotrexate? No  Additional Complaints / other details:  51 year old female. Works from home for [employer].  

## 2017-07-04 NOTE — Assessment & Plan Note (Signed)
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 Toenail and pelvic nodules consistent with metastatic disease, normal size nodules in the left axilla and left retropectoral region  CT chest abdomen pelvis 04/05/2017: Interval development of subcutaneous nodules in the ventral lower left pelvic wall and left flank compatible with subcutaneous metastases, small subcutaneous lower left back nodule stable, interval growth of 3 scattered nodular peritoneal metastases,  Current treatment: Systemic chemotherapy withXeloda 2 weeks on 1 week off  CT chest abdomen pelvis: 06/27/2017: Mixed response to therapy, some regression of previously noted intraperitoneal implants, other lesions have grown including subcutaneous nodules in the left flank, left vulvar region, right inguinal lymphadenopathy, anterior mediastinum.    Based on the above findings, I recommended systemic chemotherapy with Halaven

## 2017-07-04 NOTE — Assessment & Plan Note (Signed)
Discussed the goals of care as being palliative in nature

## 2017-07-04 NOTE — Progress Notes (Signed)
Patient Care Team: Gwendel Hanson as PCP - General (Cardiology) [name], [name], [name], [name], [name], [name], NP as Nurse Practitioner (Nurse Practitioner)  DIAGNOSIS:  Encounter Diagnoses  Name Primary?  Marland Kitchen MSH6-related Lynch syndrome (HNPCC5) Yes  . 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, estrogen receptor negative ([place])     SUMMARY OF ONCOLOGIC HISTORY:   Breast cancer of lower-outer quadrant of right female breast ([place])   12/03/2014 Mammogram    Right breast mass 1.9 cm it o'clock position 8 cm depth from the nipple      12/03/2014 Initial Diagnosis    Right breast biopsy 8:00: Invasive ductal carcinoma, grade 3, ER 0%, PR 0%, Ki-67 90%, HER-2 negative ratio 1.43      12/10/2014 Breast MRI    Right breast lower outer quadrant: 2.3 x 2.4 x 2.4 cm rim-enhancing mass abuts the pectoralis fascia but no enhancement of pectoralis muscle, second focus of artifact?'s second tissue marker clip, no lymph nodes      12/10/2014 Clinical Stage    Stage IIA: T2 N0      12/24/2014 - 04/29/2015 Neo-Adjuvant Chemotherapy    Dose dense Adriamycin and Cytoxan 4 followed by weekly Abraxane 12      05/02/2015 Breast MRI    complete radiologic response      06/16/2015 Surgery    Left Lumpectomy: Complete path Response, 0/2 LN      06/16/2015 Pathologic Stage    ypT0 ypN0      07/23/2015 - 09/05/2015 Radiation Therapy    Adjuvant RT: 50.4 Gy in 28 fractions and a boost of 10 Gy in 5 fractions to total dose of 60.4 Gy      10/24/2015 Survivorship    SCP mailed to patient in lieu of in person visit.      07/26/2016 - 07/27/2016 Radiation Therapy     SRS brain      07/27/2016 -  07/29/2016 Hospital Admission    Cerebellar mass: Right suboccipital craniotomy for tumor resection with stereotactic navigation: Metastatic poorly differentiated adenocarcinoma with extensive necrosis positive for CK 7, MOC 31, CK 5/6; Neg for Er/PR, GATA-3, GCDFP CDX2, Napsin A and TTF-1      11/17/2016 PET scan    Subcutaneous nodules in the neck, upper back, left arm, abdomen and pelvis,. 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 identified in BARD 1, BRCA2 and NF1      12/31/2016 Miscellaneous    Everolimus 10 mg daily for cycle 1 if she cannot tolerate will decrease to 7.5 mg daily      05/24/2017 - 07/04/2017 Chemotherapy    Xeloda 2000 mg 2 weeks on 1 week off stopped due to progression of disease based on CT scans done 06/27/2017       CHIEF COMPLIANT: Follow-up to discuss the results of the CT scan  INTERVAL HISTORY: Gaby Harney is a 52 year old with above-mentioned history of metastatic breast cancer who is currently on Xeloda.  She came in today to review the results of the CT scans.  She reports that her hands and feet are getting sore.  There is no evidence of a rash but hands and feet feel painful.  Denies any nausea vomiting or diarrhea.  REVIEW OF SYSTEMS:   Constitutional: Denies fevers, chills or abnormal weight loss Eyes: Denies blurriness of vision Ears, nose, mouth, throat, and face: Denies mucositis or sore throat Respiratory: Denies cough, dyspnea or wheezes Cardiovascular: Denies palpitation, chest discomfort Gastrointestinal:  Denies nausea, heartburn or change in bowel habits Skin: Denies abnormal skin rashes Lymphatics: Denies new  lymphadenopathy or easy bruising Neurological:Denies numbness, tingling or new weaknesses Behavioral/Psych: Mood is stable, no new changes  Extremities: Hands and feet feeling sore  All other systems were reviewed with the patient and are negative.  I have reviewed the past medical history, past surgical history, social history and family history with the patient and they are unchanged from previous note.  ALLERGIES:  has No Known Allergies.  MEDICATIONS:  Current Outpatient Medications  Medication Sig Dispense Refill  . ALPRAZolam (XANAX) 1 MG tablet Take 2 tablets (2 mg total) by mouth at bedtime. 60 tablet 0  . capecitabine (XELODA) 500 MG tablet Take 4 tablets (2,000 mg total) by mouth 2 (two) times daily after a meal. Take for 14 days on, 7 days off, repeat every 21 days. 112 tablet 3  . cyclobenzaprine (FLEXERIL) 5 MG tablet Take 1 tablet (5 mg total) by mouth 3 (three) times daily as needed for muscle spasms. 30 tablet 0  . lisinopril-hydrochlorothiazide (PRINZIDE,ZESTORETIC) 20-12.5 MG tablet TAKE 1 TABLET BY MOUTH TWICE DAILY 180 tablet 0  . ondansetron (ZOFRAN) 8 MG tablet Take 1 tablet (8 mg total) every 8 (eight) hours as needed by mouth for nausea. (Patient not taking: Reported on 06/08/2017) 30 tablet 3  . traZODone (DESYREL) 100 MG tablet Take 1 tablet by mouth daily.    . Vitamin D, Ergocalciferol, (DRISDOL) 50000 UNITS CAPS capsule Take 1 capsule (50,000 Units total) by mouth every 7 (seven) days. 12 capsule 3   No current facility-administered medications for this visit.     PHYSICAL EXAMINATION: ECOG PERFORMANCE STATUS: 1 - Symptomatic but completely ambulatory  Vitals:   07/04/17 0838  BP: (!) 128/93  Pulse: 86  Resp: 18  Temp: 98.9 F (37.2 C)  SpO2: 100%   Filed Weights   07/04/17 0838  Weight: 176 lb 11.2 oz (80.2 kg)    GENERAL:alert, no distress and comfortable SKIN: skin color, texture, turgor are normal, no rashes or significant lesions EYES:  normal, Conjunctiva are pink and non-injected, sclera clear OROPHARYNX:no exudate, no erythema and lips, buccal mucosa, and tongue normal  NECK: supple, thyroid normal size, non-tender, without nodularity LYMPH:  no palpable lymphadenopathy in the cervical, axillary or inguinal LUNGS: clear to auscultation and percussion with normal breathing effort HEART: regular rate & rhythm and no murmurs and no lower extremity edema ABDOMEN:abdomen soft, non-tender and normal bowel sounds MUSCULOSKELETAL:no cyanosis of digits and no clubbing  NEURO: alert & oriented x 3 with fluent speech, no focal motor/sensory deficits EXTREMITIES: No rashes or redness but they are sore to touch.  LABORATORY DATA:  I have reviewed the data as listed CMP Latest Ref Rng & Units 06/20/2017 04/04/2017 02/01/2017  Glucose 70 - 140 mg/dL 86 76 88  BUN 7 - 26 mg/dL 11 5.8(L) 7.4  Creatinine 0.60 - 1.10 mg/dL 0.71 0.8 0.7  Sodium 136 - 145 mmol/L 139 139  139  Potassium 3.3 - 4.7 mmol/L 3.5 3.7 3.6  Chloride 98 - 109 mmol/L 106 - -  CO2 22 - 29 mmol/L '26 24 24  ' Calcium 8.4 - 10.4 mg/dL 9.2 8.6 9.0  Total Protein 6.4 - 8.3 g/dL 6.9 7.0 6.5  Total Bilirubin 0.2 - 1.2 mg/dL 0.5 0.43 0.36  Alkaline Phos 40 - 150 U/L 99 131 97  AST 5 - 34 U/L '16 18 17  ' ALT 0 - 55 U/L '8 12 11    ' Lab Results  Component Value Date   WBC 4.9 06/20/2017   HGB 11.7 04/04/2017   HCT 39.4 06/20/2017   MCV 87.3 06/20/2017   PLT 204 06/20/2017   NEUTROABS 2.8 06/20/2017    ASSESSMENT & PLAN:  Breast cancer of lower-outer quadrant of right female breast ([place]) Right breast biopsy 12/03/2014 8:00: Invasive ductal carcinoma, grade 3, ER 0%, PR 0%, 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 Neoadj chemo 12/24/14- 04/29/15 AC x 4 foll by Abraxane X 12 Rt Lumpectomy: Path CR 0/2 LN Adj XRT 07/23/15- 09/08/15  Cerebellar mass diagnosed  07/12/2016: Right suboccipital craniotomy for tumor resection with stereotactic navigation 07/29/16: Metastatic poorly differentiated adenocarcinoma with extensive necrosis positive for CK 7, MOC 31, CK 5/6; Neg for Er/PR, GATA-3, GCDFP CDX2, Napsin A and TTF-1  PET/CT scan 11/17/2016:Subcutaneous nodules in the neck, upper back, left arm, abdomen and pelvis,. Toenail and pelvic nodules consistent with metastatic disease, normal size nodules in the left axilla and left retropectoral region  CT chest abdomen pelvis 04/05/2017: Interval development of subcutaneous nodules in the ventral lower left pelvic wall and left flank compatible with subcutaneous metastases, small subcutaneous lower left back nodule stable, interval growth of 3 scattered nodular peritoneal metastases,  Current treatment: Systemic chemotherapy withXeloda 2 weeks on 1 week off started January 2019-07/04/2017 stopped due to progression of disease  CT chest abdomen pelvis: 06/27/2017: Mixed response to therapy, some regression of previously noted intraperitoneal implants, other lesions have grown including subcutaneous nodules in the left flank, left vulvar region, right inguinal lymphadenopathy, anterior mediastinum.    Based on the above findings, I recommended systemic chemotherapy with [place] We also discussed other options including pursuing clinical trials at [facility] or [facility]. Patient was not interested in pursuing any external clinical trials.  Painful metastatic disease subcutaneous tissues of the back and we will were area: Palliative radiation consultation was requested.  She will be seen by them on Wednesday.  We will request for port placement and plan to start chemotherapy in 3 weeks.  I spent 25 minutes talking to the patient of which more than half was spent in counseling and coordination of care.  No orders of the defined types were placed in this encounter.  The patient has a good understanding of the overall plan.  she agrees with it. she will call with any problems that may develop before the next visit here.   Harriette Ohara, MD 07/04/17

## 2017-07-04 NOTE — Progress Notes (Signed)
.  nurse

## 2017-07-04 NOTE — Progress Notes (Signed)
START ON PATHWAY REGIMEN - Breast     A cycle is every 21 days:     Eribulin mesylate   **Always confirm dose/schedule in your pharmacy ordering system**    Patient Characteristics: Distant Metastases or Locoregional Recurrent Disease - Unresected, HER2 Negative/Unknown/Equivocal, ER Negative/Unknown, Chemotherapy, Third Line, Prior Anthracycline or Anthracycline Contraindicated and Prior Taxane Therapeutic Status: Distant Metastases BRCA Mutation Status: Absent ER Status: Negative (-) HER2 Status: Negative (-) Would you be surprised if this patient died  in the next year<= I would be surprised if this patient died in the next year PR Status: Negative (-) Line of therapy: Third Line Intent of Therapy: Non-Curative / Palliative Intent, Discussed with Patient

## 2017-07-05 DIAGNOSIS — C792 Secondary malignant neoplasm of skin: Secondary | ICD-10-CM | POA: Insufficient documentation

## 2017-07-05 NOTE — Progress Notes (Signed)
Radiation Oncology         [phone] ________________________________  Name: Kaitlyn Keith MRN: [id number]  Date: 07/06/2017  DOB: October 25, 1965  Outpatient Re-Consult Note  CC: Maisie Fus, MD  Diagnosis:    52 y.o. woman with painful subcutaneous metastasis in the left flank and left vulvar region from cancer of the lower outer quadrant of the right breast.        ICD-10-CM   1. 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 is a pleasant 52 y.o. 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 She was diagnosed with her cancer in July 2016, and underwent neoadjuvant chemotherapy followed by right lumpectomy and adjuvant radiation. She was found to have recurrent disease in February 2018 with a right cerebellar mass, and underwent preoperative SRS treatment followed by surgical resection on 07/29/2016. She is being followed in our multidisciplinary brain oncology conference, and her last MRI scan on 06/02/17 revealed stability of her previously treated site of disease, with stability of her resection cavity, and no new disease.   She developed multiple subcutaneous nodules in the neck, upper back, left arm, abdomen and pelvis, noted on PET scan from 11/17/16. She was started on Everolimus on 12/31/16 but was discontinued 04/11/17 due to disease progression with interval development of subcutaneous nodules in the ventral lower left pelvic wall and left flank and interval growth of 3 scattered peritoneal metastases. She started Xeloda in 05/2017 but discontinued due to disease progression on most recent re-staging scans from 06/27/17 which showed a mixed response to therapy with some regression of the reviously noted  intraperitoneal implants but other lesions with significant interval growth including subcutaneous nodules in the left flank, left vulvar region, right inguinal LAN and anterior mediastinal LAN.  The current plan is to switch systemic therapy to Halaven.  She is referred back today to discuss the potential role of radiotherapy in the treatment of her painful metastatic subcutaneous nodules in the left flank (2.4 x 2.6 cm) and superior aspect of the left vulvar region (2 x 1.9 cm).    On review of systems, the patient reports that she is doing well overall.  She denies headaches, dizziness, imbalance, tinnitus, tremor, seizure activity or changes in her visual or auditory acuity. She denies any chest pain, shortness of breath, cough, fevers, chills, night sweats, unintended weight changes. She denies any bowel or bladder disturbances, and denies abdominal pain, nausea or vomiting. She has enlarging subcutaneous nodules in the left flank and left vulvar region which are bothersome. She denies any skin breakdown or drainage from these sites and reports that they are not particularly painful but are becoming more uncomfortable with her clothing fit.  A complete review of systems is obtained and is otherwise negative.  Past Medical History:  Past Medical History:  Diagnosis Date  . Anxiety   . Arthritis   . Back pain   . Brain cancer ([place])    brian met from triple negative breast ca  . Breast cancer ([place])   . Breast cancer of lower-outer quadrant of right female breast ([place]) 12/05/2014  . Depression   . FH: chemotherapy 12/2014-04/2015  . Genetic testing 12/10/2016   [name] underwent  genetic counseling and testing for hereditary cancer syndromes on 11/18/2016. 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 Mutations in MSH6 are associated with a hereditary cancer syndrome called Lynch syndrome. For more detailed discussion, please see genetic  counseling documentation from 12/10/2016.  Testing was perfo  . Headache    due to brain cancer, no longer having them  . Hot flashes   . Hypertension   . 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 underwent genetic counseling and testing for hereditary cancer syndromes on 11/18/2016. 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 Mutations in MSH6 are associated with a hereditary cancer syndrome called Lynch syndrome. For more detailed discussion, please see genetic counseling documentation from 12/10/2016.  Testing was perfo  . 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 Radiation 07/23/15-09/05/15   right breast 50.4 Gy, boost of 10 Gy    Past Surgical History: Past Surgical History:  Procedure Laterality Date  . APPLICATION OF CRANIAL NAVIGATION Right 07/27/2016   Procedure: APPLICATION OF CRANIAL NAVIGATION;  Surgeon: Kevan Ny Ditty, MD;  Location: [facility];  Service: Neurosurgery;  Laterality: Right;  . BIOPSY OF SKIN SUBCUTANEOUS TISSUE AND/OR MUCOUS MEMBRANE Left 12/24/2016   Procedure: OPEN BIOPSY LESIONS ON LEFT LOWER BACK AND SHOULDER BLADE;  Surgeon: [name], MD;  Location: [facility];  Service: General;  Laterality: Left;  . BREAST LUMPECTOMY WITH NEEDLE LOCALIZATION AND AXILLARY SENTINEL LYMPH NODE BX Right 06/16/2015   Procedure: BREAST LUMPECTOMY WITH NEEDLE LOCALIZATION AND AXILLARY SENTINEL LYMPH NODE BX;  Surgeon: [name], MD;  Location: [facility];  Service: General;  Laterality: Right;  . BREAST REDUCTION SURGERY    . CRANIOTOMY Right 07/27/2016   Procedure: Right Suboccipital craniotomy for tumor resection with stereotactic navigation;  Surgeon: Kevan Ny Ditty, MD;  Location: [facility];  Service: Neurosurgery;  Laterality: Right;  . PORT-A-CATH REMOVAL N/A 06/16/2015   Procedure: REMOVAL PORT-A-CATH;  Surgeon: [name], MD;  Location: [facility];  Service: General;  Laterality: N/A;  . PORTACATH PLACEMENT N/A 12/23/2014   Procedure: INSERTION PORT-A-CATH;  Surgeon: [name], MD;  Location: [facility];  Service: General;  Laterality: N/A;    Social History:  Social History   Socioeconomic History  . Marital status: Divorced    Spouse name: Not on file  . Number of children: 1  . Years of education: Not on file  . Highest education level: Not on file  Social Needs  . Financial resource strain: Not on file  . Food insecurity - worry: Not on file  . Food insecurity - inability: Not on file  . Transportation needs - medical: Not on file  . Transportation needs - non-medical: Not on file  Occupational History  . Not on file  Tobacco Use  . Smoking status: Never Smoker  . Smokeless tobacco: Never Used  Substance and Sexual Activity  . Alcohol use: Yes    Comment: social  . Drug use: No  . Sexual activity: Yes  Other Topics Concern  . Not on file  Social History Narrative  . Not on file  The patient is divorced. She works for [employer]  Family History: Family History  Problem Relation Age of Onset  . Aneurysm Mother 4       d.55  . Heart attack Father 60  d.40  . Endometrial cancer Sister 75  . Lung cancer Maternal Uncle        d.68s  . Cancer Maternal Grandmother        unspecified type-possibly stomach d.88s                          ALLERGIES:  has No Known Allergies.  Meds: Current Outpatient Medications  Medication Sig Dispense Refill  . ALPRAZolam (XANAX) 1 MG tablet Take 2 tablets (2 mg total) by mouth at bedtime. 60 tablet 0  . cyclobenzaprine (FLEXERIL) 5 MG tablet Take 1 tablet (5 mg total) by mouth 3 (three) times daily as needed for muscle spasms. 30 tablet 0  . lisinopril-hydrochlorothiazide (PRINZIDE,ZESTORETIC) 20-12.5 MG tablet TAKE 1 TABLET BY MOUTH TWICE DAILY 180 tablet 0   No current facility-administered medications for this encounter.      Physical Findings:   height is '5\' 3"'  (1.6 m) and weight is 176 lb 6.4 oz (80 kg). Her oral temperature is 98.3 F (36.8 C). Her blood pressure is 126/91 (abnormal) and her pulse is 91. Her respiration is 18 and oxygen saturation is 100%.   Pain Assessment Pain Score: 0-No pain/10  In general this is a well appearing African American female in no acute distress. She's alert and oriented x4 and appropriate throughout the examination. Cardiopulmonary assessment is negative for acute distress and she exhibits normal effort. There are palpable masses in the left flank, measuring approximately 2.5cm and in the left pubic/vulvar region measuring approximately 2 cm.  They are not tender to palpation. The nodule in the left pubic region is very close to the skin surface and appears at risk for erosion through the skin if left untreated. She appears grossly neurologically intact with PERRLA and EOMs intact bilaterally. Sensation is intact to light touch and strength is 5/5 and equal bilaterally in the upper and lower extremities.  She walks with a normal gait.   Lab Findings: Lab Results  Component Value Date   WBC 4.9 06/20/2017   WBC 4.4 04/04/2017   WBC 4.9 12/24/2016   HGB 11.7 04/04/2017   HCT 39.4 06/20/2017   HCT 35.9 04/04/2017   PLT 204 06/20/2017   PLT 207 04/04/2017    Lab Results  Component Value Date   NA 139 06/20/2017   NA 139 04/04/2017   K 3.5 06/20/2017   K 3.7 04/04/2017   CHLORIDE 108 04/04/2017   CO2 26 06/20/2017   CO2 24 04/04/2017   GLUCOSE 86 06/20/2017   GLUCOSE 76 04/04/2017   BUN 11 06/20/2017   BUN 5.8 (L) 04/04/2017   CREATININE 0.71 06/20/2017   CREATININE 0.8 04/04/2017   BILITOT 0.5 06/20/2017   BILITOT 0.43 04/04/2017   ALKPHOS 99 06/20/2017   ALKPHOS 131 04/04/2017   AST 16 06/20/2017   AST 18 04/04/2017   ALT 8 06/20/2017   ALT 12 04/04/2017   PROT 6.9 06/20/2017   PROT 7.0 04/04/2017   ALBUMIN 3.6 06/20/2017   ALBUMIN 3.0 (L)  04/04/2017   CALCIUM 9.2 06/20/2017   CALCIUM 8.6 04/04/2017   ANIONGAP 7 06/20/2017    Radiographic Findings: Ct Chest W Contrast  Result Date: 06/27/2017 CLINICAL DATA:  52 year old female with history of breast cancer with known metastatic disease to the brain status post right lumpectomy, chemotherapy and radiation therapy. Followup study. EXAM: CT CHEST, ABDOMEN, AND PELVIS WITH CONTRAST TECHNIQUE: Multidetector CT imaging of the chest, abdomen and pelvis was  performed following the standard protocol during bolus administration of intravenous contrast. CONTRAST:  121m ISOVUE-300 IOPAMIDOL (ISOVUE-300) INJECTION 61% COMPARISON:  Multiple priors, most recently CT of the chest, abdomen and pelvis 04/04/2017. FINDINGS: CT CHEST FINDINGS Cardiovascular: Heart size is normal. There is no significant pericardial fluid, thickening or pericardial calcification. Aortic atherosclerosis. No definite coronary artery calcifications. Mediastinum/Nodes: Small amount of soft tissue thickening in the anterior mediastinum, more conspicuous than the prior examination, measuring up to 3.1 x 1.2 cm (axial image 19 of series 2). No other mediastinal or hilar lymphadenopathy noted. Esophagus is unremarkable in appearance. No axillary lymphadenopathy. Surgical clips in the right axilla from prior lymph node dissection. Lungs/Pleura: Patchy areas of ground-glass attenuation and mild septal thickening in the lung bases bilaterally, overall significantly improved compared to the prior examination, likely to reflect areas of mild scarring in the setting of prior infection/inflammation. No confluent consolidative airspace disease. No pleural effusions. No suspicious appearing pulmonary nodules or masses. Musculoskeletal: There are no aggressive appearing lytic or blastic lesions noted in the visualized portions of the skeleton. CT ABDOMEN PELVIS FINDINGS Hepatobiliary: No cystic or solid hepatic lesions. No intra or extrahepatic  biliary ductal dilatation. Gallbladder is normal in appearance. Pancreas: No pancreatic mass. No pancreatic ductal dilatation. No pancreatic or peripancreatic fluid or inflammatory changes. Spleen: Unremarkable. Adrenals/Urinary Tract: Bilateral adrenal glands and bilateral kidneys are normal in appearance. No hydroureteronephrosis. Urinary bladder is normal in appearance. Stomach/Bowel: Normal appearance of the stomach. No pathologic dilatation of small bowel or colon. Normal appendix. Vascular/Lymphatic: Mild aortic atherosclerosis, without evidence of aneurysm or dissection in the abdominal or pelvic vasculature. Mildly enlarged right inguinal lymph node measuring 12 mm in short axis (axial image 99 of series 2). No other lymphadenopathy is noted elsewhere in the abdomen or pelvis. Reproductive: Uterus and ovaries are unremarkable in appearance. Other: Previously noted intraperitoneal soft tissue nodules have generally decreased in size, most evident by a 6 mm nodule in the left side of the abdomen (axial image 71 of series 2) which previously measured 14 mm. There is one soft tissue nodule in the central mesentery (axial image 81 of series 2) which currently measures 21 x 12 mm (previously 15 x 13 mm). The previously noted subcutaneous implant in the left flank subcutaneous fat (axial image 55 of series 2) has increased substantially in size, currently measuring 2.4 x 2.6 cm (previously 1.6 cm). Additionally, the previously noted subcutaneous enhancing soft tissue nodule in the superior aspect of the left vulvar region (axial image 109 of series 2) has also increased in size measuring 20 x 19 mm (previously 12 x 12 mm). No significant volume of ascites. No pneumoperitoneum. Musculoskeletal: There are no aggressive appearing lytic or blastic lesions noted in the visualized portions of the skeleton. IMPRESSION: 1. Today's study demonstrates a mixed response to therapy. Specifically, while there has been some  regression of previously noted intraperitoneal implants, other lesions have grown, most notable for enlargement of subcutaneous nodules in the left flank and superior aspect of the left vulvar region. There is also new right inguinal lymphadenopathy, and some increasingly prominent soft tissue in the anterior mediastinum which may represent malignant nodal tissue. 2. Parenchymal lung changes seen on the prior examination have largely resolved, with what appears to be areas of mild residual scarring, indicative of an infectious or inflammatory process on the prior examination. 3. Additional incidental findings, as above. Electronically Signed   By: DVinnie LangtonM.D.   On: 06/27/2017 16:37   Ct  Abdomen Pelvis W Contrast  Result Date: 06/27/2017 CLINICAL DATA:  52 year old female with history of breast cancer with known metastatic disease to the brain status post right lumpectomy, chemotherapy and radiation therapy. Followup study. EXAM: CT CHEST, ABDOMEN, AND PELVIS WITH CONTRAST TECHNIQUE: Multidetector CT imaging of the chest, abdomen and pelvis was performed following the standard protocol during bolus administration of intravenous contrast. CONTRAST:  15m ISOVUE-300 IOPAMIDOL (ISOVUE-300) INJECTION 61% COMPARISON:  Multiple priors, most recently CT of the chest, abdomen and pelvis 04/04/2017. FINDINGS: CT CHEST FINDINGS Cardiovascular: Heart size is normal. There is no significant pericardial fluid, thickening or pericardial calcification. Aortic atherosclerosis. No definite coronary artery calcifications. Mediastinum/Nodes: Small amount of soft tissue thickening in the anterior mediastinum, more conspicuous than the prior examination, measuring up to 3.1 x 1.2 cm (axial image 19 of series 2). No other mediastinal or hilar lymphadenopathy noted. Esophagus is unremarkable in appearance. No axillary lymphadenopathy. Surgical clips in the right axilla from prior lymph node dissection. Lungs/Pleura: Patchy  areas of ground-glass attenuation and mild septal thickening in the lung bases bilaterally, overall significantly improved compared to the prior examination, likely to reflect areas of mild scarring in the setting of prior infection/inflammation. No confluent consolidative airspace disease. No pleural effusions. No suspicious appearing pulmonary nodules or masses. Musculoskeletal: There are no aggressive appearing lytic or blastic lesions noted in the visualized portions of the skeleton. CT ABDOMEN PELVIS FINDINGS Hepatobiliary: No cystic or solid hepatic lesions. No intra or extrahepatic biliary ductal dilatation. Gallbladder is normal in appearance. Pancreas: No pancreatic mass. No pancreatic ductal dilatation. No pancreatic or peripancreatic fluid or inflammatory changes. Spleen: Unremarkable. Adrenals/Urinary Tract: Bilateral adrenal glands and bilateral kidneys are normal in appearance. No hydroureteronephrosis. Urinary bladder is normal in appearance. Stomach/Bowel: Normal appearance of the stomach. No pathologic dilatation of small bowel or colon. Normal appendix. Vascular/Lymphatic: Mild aortic atherosclerosis, without evidence of aneurysm or dissection in the abdominal or pelvic vasculature. Mildly enlarged right inguinal lymph node measuring 12 mm in short axis (axial image 99 of series 2). No other lymphadenopathy is noted elsewhere in the abdomen or pelvis. Reproductive: Uterus and ovaries are unremarkable in appearance. Other: Previously noted intraperitoneal soft tissue nodules have generally decreased in size, most evident by a 6 mm nodule in the left side of the abdomen (axial image 71 of series 2) which previously measured 14 mm. There is one soft tissue nodule in the central mesentery (axial image 81 of series 2) which currently measures 21 x 12 mm (previously 15 x 13 mm). The previously noted subcutaneous implant in the left flank subcutaneous fat (axial image 55 of series 2) has increased  substantially in size, currently measuring 2.4 x 2.6 cm (previously 1.6 cm). Additionally, the previously noted subcutaneous enhancing soft tissue nodule in the superior aspect of the left vulvar region (axial image 109 of series 2) has also increased in size measuring 20 x 19 mm (previously 12 x 12 mm). No significant volume of ascites. No pneumoperitoneum. Musculoskeletal: There are no aggressive appearing lytic or blastic lesions noted in the visualized portions of the skeleton. IMPRESSION: 1. Today's study demonstrates a mixed response to therapy. Specifically, while there has been some regression of previously noted intraperitoneal implants, other lesions have grown, most notable for enlargement of subcutaneous nodules in the left flank and superior aspect of the left vulvar region. There is also new right inguinal lymphadenopathy, and some increasingly prominent soft tissue in the anterior mediastinum which may represent malignant nodal tissue. 2. Parenchymal  lung changes seen on the prior examination have largely resolved, with what appears to be areas of mild residual scarring, indicative of an infectious or inflammatory process on the prior examination. 3. Additional incidental findings, as above. Electronically Signed   By: Vinnie Langton M.D.   On: 06/27/2017 16:37    Impression/Plan: 1. 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 Today, we talked to the patient about the findings and workup thus far. We discussed the natural history of metastatic carcinoma and general treatment, highlighting the role of palliative radiotherapy in the management. We discussed the available radiation techniques, and focused on the details of logistics and delivery. The recommendation is for a 2 week course of daily radiotherapy to the left flank nodule and left vulvar nodule  delivered over 10 fractions to a total dose of 30 Gy. We reviewed the anticipated acute and late sequelae associated with radiation in this setting. The patient was encouraged to ask questions that were answered to her satisfaction.  At the conclusion of our conversation, the patient is interested in proceeding with palliative radiotherapy to the left flank and left vulvar subcutaneous nodules.  She freely signed written consent today in the office and is scheduled for CT simulation/planning following our visit today in anticipation of beginning treatment later this week or early next week.  2. Brain metastasis.  Patient appears to be clinically and radiographically stable regarding her brain disease.  We will continue to monitor closely with repeat MRI every 3 months to monitor for any evidence of disease progression or recurrence. She will continue in follow up with Dr. Lindi Adie for management of her systemic therapy- plan to begin Halaven in the near future.     Nicholos Johns, PA-C    Tyler Pita, MD  [facility] Direct Dial: [phone]  Fax: [phone] [email or web address]  Skype  [email or web address]

## 2017-07-06 ENCOUNTER — Encounter: Payer: Self-pay | Admitting: Radiation Oncology

## 2017-07-06 ENCOUNTER — Ambulatory Visit
Admission: RE | Admit: 2017-07-06 | Discharge: 2017-07-06 | Disposition: A | Discharge status: Home / Self Care | Payer: BLUE CROSS/BLUE SHIELD | Source: Ambulatory Visit | Attending: Radiation Oncology | Admitting: Radiation Oncology

## 2017-07-06 ENCOUNTER — Other Ambulatory Visit: Payer: Self-pay

## 2017-07-06 DIAGNOSIS — C792 Secondary malignant neoplasm of skin: Secondary | ICD-10-CM | POA: Diagnosis not present

## 2017-07-06 DIAGNOSIS — F419 Anxiety disorder, unspecified: Secondary | ICD-10-CM | POA: Insufficient documentation

## 2017-07-06 DIAGNOSIS — Z1501 Genetic susceptibility to malignant neoplasm of breast: Secondary | ICD-10-CM | POA: Diagnosis not present

## 2017-07-06 DIAGNOSIS — Y842 Radiological procedure and radiotherapy as the cause of abnormal reaction of the patient, or of later complication, without mention of misadventure at the time of the procedure: Secondary | ICD-10-CM | POA: Diagnosis not present

## 2017-07-06 DIAGNOSIS — Z79899 Other long term (current) drug therapy: Secondary | ICD-10-CM | POA: Diagnosis not present

## 2017-07-06 DIAGNOSIS — C7931 Secondary malignant neoplasm of brain: Secondary | ICD-10-CM | POA: Diagnosis not present

## 2017-07-06 DIAGNOSIS — F329 Major depressive disorder, single episode, unspecified: Secondary | ICD-10-CM | POA: Insufficient documentation

## 2017-07-06 DIAGNOSIS — Z809 Family history of malignant neoplasm, unspecified: Secondary | ICD-10-CM | POA: Diagnosis not present

## 2017-07-06 DIAGNOSIS — I700 Atherosclerosis of aorta: Secondary | ICD-10-CM | POA: Insufficient documentation

## 2017-07-06 DIAGNOSIS — M129 Arthropathy, unspecified: Secondary | ICD-10-CM | POA: Insufficient documentation

## 2017-07-06 DIAGNOSIS — Z808 Family history of malignant neoplasm of other organs or systems: Secondary | ICD-10-CM | POA: Insufficient documentation

## 2017-07-06 DIAGNOSIS — Z923 Personal history of irradiation: Secondary | ICD-10-CM | POA: Insufficient documentation

## 2017-07-06 DIAGNOSIS — Z171 Estrogen receptor negative status [ER-]: Secondary | ICD-10-CM | POA: Insufficient documentation

## 2017-07-06 DIAGNOSIS — G629 Polyneuropathy, unspecified: Secondary | ICD-10-CM | POA: Diagnosis not present

## 2017-07-06 DIAGNOSIS — Z801 Family history of malignant neoplasm of trachea, bronchus and lung: Secondary | ICD-10-CM | POA: Diagnosis not present

## 2017-07-06 DIAGNOSIS — I10 Essential (primary) hypertension: Secondary | ICD-10-CM | POA: Diagnosis not present

## 2017-07-06 DIAGNOSIS — C50511 Malignant neoplasm of lower-outer quadrant of right female breast: Secondary | ICD-10-CM | POA: Diagnosis not present

## 2017-07-06 DIAGNOSIS — Z510 Encounter for antineoplastic radiation therapy: Secondary | ICD-10-CM | POA: Diagnosis present

## 2017-07-06 DIAGNOSIS — R229 Localized swelling, mass and lump, unspecified: Secondary | ICD-10-CM

## 2017-07-06 NOTE — Progress Notes (Signed)
See progress note under physician encounter. 

## 2017-07-07 ENCOUNTER — Telehealth: Payer: Self-pay | Admitting: Hematology and Oncology

## 2017-07-07 NOTE — Progress Notes (Signed)
  Radiation Oncology         (336) [phone] ________________________________  Name: Kaitlyn Keith MRN: [id number]  Date: 07/06/2017  DOB: [birth year]  SIMULATION AND TREATMENT PLANNING NOTE    ICD-10-CM   1. 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  Identity was confirmed.  All relevant records and images related to the planned course of therapy were reviewed.  The patient freely provided informed written consent to proceed with treatment after reviewing the details related to the planned course of therapy. The consent form was witnessed and verified by the simulation staff.  Then, the patient was set-up in a stable reproducible  supine position for radiation therapy.  CT images were obtained.  Surface markings were placed.  The CT images were loaded into the planning software.  Then the target and avoidance structures were contoured.  Treatment planning then occurred.  The radiation prescription was entered and confirmed.  Then, I designed and supervised the construction of a total of 7 medically necessary complex treatment devices. With 3 MLCs per target to shield critical structures and one bodyFix  I have requested : 3D plan  PLAN:  The patient will receive 30 Gy in 10 fractions.  ________________________________  Sheral Apley Tammi Klippel, M.D.

## 2017-07-07 NOTE — Telephone Encounter (Signed)
Spoke to patient regarding upcoming March and April appointments per 2/13 sch message.

## 2017-07-07 NOTE — Pre-Procedure Instructions (Signed)
Ensure pre-surgery drink 10 oz. given to pt. with instructions to drink by 0815 in AM.  Pt. voiced understanding.

## 2017-07-08 ENCOUNTER — Encounter (HOSPITAL_BASED_OUTPATIENT_CLINIC_OR_DEPARTMENT_OTHER): Payer: Self-pay | Admitting: *Deleted

## 2017-07-08 ENCOUNTER — Other Ambulatory Visit: Payer: Self-pay

## 2017-07-08 ENCOUNTER — Ambulatory Visit (HOSPITAL_BASED_OUTPATIENT_CLINIC_OR_DEPARTMENT_OTHER)
Admission: RE | Admit: 2017-07-08 | Discharge: 2017-07-08 | Disposition: A | Discharge status: Home / Self Care | Payer: BLUE CROSS/BLUE SHIELD | Source: Ambulatory Visit | Attending: General Surgery | Admitting: General Surgery

## 2017-07-08 ENCOUNTER — Ambulatory Visit (HOSPITAL_BASED_OUTPATIENT_CLINIC_OR_DEPARTMENT_OTHER): Payer: BLUE CROSS/BLUE SHIELD | Admitting: Anesthesiology

## 2017-07-08 ENCOUNTER — Ambulatory Visit (HOSPITAL_COMMUNITY): Payer: BLUE CROSS/BLUE SHIELD

## 2017-07-08 ENCOUNTER — Encounter (HOSPITAL_BASED_OUTPATIENT_CLINIC_OR_DEPARTMENT_OTHER)
Admission: RE | Disposition: A | Discharge status: Home / Self Care | Payer: Self-pay | Source: Ambulatory Visit | Attending: General Surgery

## 2017-07-08 DIAGNOSIS — F419 Anxiety disorder, unspecified: Secondary | ICD-10-CM | POA: Diagnosis not present

## 2017-07-08 DIAGNOSIS — Z853 Personal history of malignant neoplasm of breast: Secondary | ICD-10-CM | POA: Diagnosis not present

## 2017-07-08 DIAGNOSIS — C50511 Malignant neoplasm of lower-outer quadrant of right female breast: Secondary | ICD-10-CM | POA: Diagnosis present

## 2017-07-08 DIAGNOSIS — Z510 Encounter for antineoplastic radiation therapy: Secondary | ICD-10-CM | POA: Diagnosis not present

## 2017-07-08 DIAGNOSIS — F329 Major depressive disorder, single episode, unspecified: Secondary | ICD-10-CM | POA: Insufficient documentation

## 2017-07-08 DIAGNOSIS — C7989 Secondary malignant neoplasm of other specified sites: Secondary | ICD-10-CM | POA: Insufficient documentation

## 2017-07-08 DIAGNOSIS — Z85841 Personal history of malignant neoplasm of brain: Secondary | ICD-10-CM | POA: Diagnosis not present

## 2017-07-08 DIAGNOSIS — Z95828 Presence of other vascular implants and grafts: Secondary | ICD-10-CM

## 2017-07-08 DIAGNOSIS — Z419 Encounter for procedure for purposes other than remedying health state, unspecified: Secondary | ICD-10-CM

## 2017-07-08 HISTORY — PX: PORTACATH PLACEMENT: SHX2246

## 2017-07-08 SURGERY — INSERTION, TUNNELED CENTRAL VENOUS DEVICE, WITH PORT
Anesthesia: General | Site: Neck | Laterality: Left

## 2017-07-08 MED ORDER — FENTANYL CITRATE (PF) 100 MCG/2ML IJ SOLN
INTRAMUSCULAR | Status: DC | PRN
  Administered 2017-07-08: 100 ug via INTRAVENOUS
  Administered 2017-07-08 (×2): 25 ug via INTRAVENOUS

## 2017-07-08 MED ORDER — CHLORHEXIDINE GLUCONATE CLOTH 2 % EX PADS: 6.0000 | MEDICATED_PAD | Freq: Once | CUTANEOUS | Status: DC

## 2017-07-08 MED ORDER — FENTANYL CITRATE (PF) 100 MCG/2ML IJ SOLN: 50.0000 ug | INTRAMUSCULAR | Status: DC | PRN

## 2017-07-08 MED ORDER — ACETAMINOPHEN 500 MG PO TABS
ORAL_TABLET | ORAL | Status: AC
  Filled 2017-07-08: qty 2

## 2017-07-08 MED ORDER — CEFAZOLIN SODIUM-DEXTROSE 2-4 GM/100ML-% IV SOLN
2.0000 g | INTRAVENOUS | Status: AC
  Administered 2017-07-08: 2 g via INTRAVENOUS

## 2017-07-08 MED ORDER — HEPARIN SODIUM (PORCINE) 5000 UNIT/ML IJ SOLN
INTRAMUSCULAR | Status: AC
  Filled 2017-07-08: qty 1

## 2017-07-08 MED ORDER — PROPOFOL 10 MG/ML IV BOLUS
INTRAVENOUS | Status: DC | PRN
  Administered 2017-07-08: 150 mg via INTRAVENOUS

## 2017-07-08 MED ORDER — HYDROMORPHONE HCL 1 MG/ML IJ SOLN
0.2500 mg | INTRAMUSCULAR | Status: DC | PRN
Start: 2017-07-08 — End: 2017-07-08

## 2017-07-08 MED ORDER — LACTATED RINGERS IV SOLN
INTRAVENOUS | Status: DC
  Administered 2017-07-08: 10:00:00 via INTRAVENOUS
  Administered 2017-07-08: 1 mL/h via INTRAVENOUS

## 2017-07-08 MED ORDER — SCOPOLAMINE 1 MG/3DAYS TD PT72: 1.0000 | MEDICATED_PATCH | Freq: Once | TRANSDERMAL | Status: DC | PRN

## 2017-07-08 MED ORDER — ONDANSETRON HCL 4 MG/2ML IJ SOLN
INTRAMUSCULAR | Status: DC | PRN
  Administered 2017-07-08: 4 mg via INTRAVENOUS

## 2017-07-08 MED ORDER — FENTANYL CITRATE (PF) 100 MCG/2ML IJ SOLN
INTRAMUSCULAR | Status: AC
  Filled 2017-07-08: qty 2

## 2017-07-08 MED ORDER — GABAPENTIN 300 MG PO CAPS
300.0000 mg | ORAL_CAPSULE | ORAL | Status: AC
  Administered 2017-07-08: 300 mg via ORAL

## 2017-07-08 MED ORDER — GABAPENTIN 300 MG PO CAPS
ORAL_CAPSULE | ORAL | Status: AC
  Filled 2017-07-08: qty 1

## 2017-07-08 MED ORDER — ONDANSETRON HCL 4 MG/2ML IJ SOLN: 4.0000 mg | Freq: Once | INTRAMUSCULAR | Status: DC | PRN

## 2017-07-08 MED ORDER — MEPERIDINE HCL 25 MG/ML IJ SOLN: 6.2500 mg | INTRAMUSCULAR | Status: DC | PRN

## 2017-07-08 MED ORDER — CELECOXIB 200 MG PO CAPS
ORAL_CAPSULE | ORAL | Status: AC
  Filled 2017-07-08: qty 1

## 2017-07-08 MED ORDER — HEPARIN SOD (PORK) LOCK FLUSH 100 UNIT/ML IV SOLN
INTRAVENOUS | Status: DC | PRN
  Administered 2017-07-08: 500 [IU] via INTRAVENOUS

## 2017-07-08 MED ORDER — ACETAMINOPHEN 500 MG PO TABS
1000.0000 mg | ORAL_TABLET | ORAL | Status: AC
  Administered 2017-07-08: 1000 mg via ORAL

## 2017-07-08 MED ORDER — HEPARIN SOD (PORK) LOCK FLUSH 100 UNIT/ML IV SOLN
INTRAVENOUS | Status: AC
  Filled 2017-07-08: qty 5

## 2017-07-08 MED ORDER — IOPAMIDOL (ISOVUE-300) INJECTION 61%
INTRAVENOUS | Status: AC
  Filled 2017-07-08: qty 50

## 2017-07-08 MED ORDER — MIDAZOLAM HCL 5 MG/5ML IJ SOLN
INTRAMUSCULAR | Status: DC | PRN
  Administered 2017-07-08: 2 mg via INTRAVENOUS

## 2017-07-08 MED ORDER — CELECOXIB 200 MG PO CAPS
200.0000 mg | ORAL_CAPSULE | ORAL | Status: AC
  Administered 2017-07-08: 200 mg via ORAL

## 2017-07-08 MED ORDER — HYDROCODONE-ACETAMINOPHEN 5-325 MG PO TABS: 1.0000 | ORAL_TABLET | Freq: Four times a day (QID) | ORAL | 0 refills | Status: DC | PRN

## 2017-07-08 MED ORDER — HEPARIN (PORCINE) IN NACL 2-0.9 UNIT/ML-% IJ SOLN
INTRAMUSCULAR | Status: AC | PRN
  Administered 2017-07-08: 100 mL via INTRAVENOUS

## 2017-07-08 MED ORDER — LIDOCAINE 2% (20 MG/ML) 5 ML SYRINGE
INTRAMUSCULAR | Status: AC
  Filled 2017-07-08: qty 5

## 2017-07-08 MED ORDER — PROPOFOL 10 MG/ML IV BOLUS
INTRAVENOUS | Status: AC
  Filled 2017-07-08: qty 20

## 2017-07-08 MED ORDER — BUPIVACAINE HCL (PF) 0.25 % IJ SOLN
INTRAMUSCULAR | Status: DC | PRN
  Administered 2017-07-08: 10 mL

## 2017-07-08 MED ORDER — LIDOCAINE HCL (CARDIAC) 20 MG/ML IV SOLN
INTRAVENOUS | Status: DC | PRN
  Administered 2017-07-08: 30 mg via INTRAVENOUS

## 2017-07-08 MED ORDER — MIDAZOLAM HCL 2 MG/2ML IJ SOLN: 1.0000 mg | INTRAMUSCULAR | Status: DC | PRN

## 2017-07-08 MED ORDER — MIDAZOLAM HCL 2 MG/2ML IJ SOLN
INTRAMUSCULAR | Status: AC
  Filled 2017-07-08: qty 2

## 2017-07-08 MED ORDER — DEXAMETHASONE SODIUM PHOSPHATE 4 MG/ML IJ SOLN
INTRAMUSCULAR | Status: DC | PRN
  Administered 2017-07-08: 10 mg via INTRAVENOUS

## 2017-07-08 MED ORDER — HEPARIN (PORCINE) IN NACL 2-0.9 UNIT/ML-% IJ SOLN
INTRAMUSCULAR | Status: AC
  Filled 2017-07-08: qty 500

## 2017-07-08 MED ORDER — CEFAZOLIN SODIUM-DEXTROSE 2-4 GM/100ML-% IV SOLN
INTRAVENOUS | Status: AC
  Filled 2017-07-08: qty 100

## 2017-07-08 SURGICAL SUPPLY — 43 items
BAG DECANTER FOR FLEXI CONT (MISCELLANEOUS) ×2 IMPLANT
BLADE SURG 15 STRL LF DISP TIS (BLADE) ×1 IMPLANT
BLADE SURG 15 STRL SS (BLADE) ×1
CANISTER SUCT 1200ML W/VALVE (MISCELLANEOUS) IMPLANT
CHLORAPREP W/TINT 26ML (MISCELLANEOUS) ×2 IMPLANT
CLEANER CAUTERY TIP 5X5 PAD (MISCELLANEOUS) ×1 IMPLANT
COVER BACK TABLE 60X90IN (DRAPES) ×2 IMPLANT
COVER MAYO STAND STRL (DRAPES) ×2 IMPLANT
DECANTER SPIKE VIAL GLASS SM (MISCELLANEOUS) IMPLANT
DERMABOND ADVANCED (GAUZE/BANDAGES/DRESSINGS) ×1
DERMABOND ADVANCED .7 DNX12 (GAUZE/BANDAGES/DRESSINGS) ×1 IMPLANT
DRAPE C-ARM 42X72 X-RAY (DRAPES) ×2 IMPLANT
DRAPE LAPAROSCOPIC ABDOMINAL (DRAPES) ×2 IMPLANT
DRAPE UTILITY XL STRL (DRAPES) ×2 IMPLANT
ELECT REM PT RETURN 9FT ADLT (ELECTROSURGICAL) ×2
ELECTRODE REM PT RTRN 9FT ADLT (ELECTROSURGICAL) ×1 IMPLANT
GLOVE BIO SURGEON STRL SZ7.5 (GLOVE) ×2 IMPLANT
GOWN STRL REUS W/ TWL LRG LVL3 (GOWN DISPOSABLE) ×2 IMPLANT
GOWN STRL REUS W/TWL LRG LVL3 (GOWN DISPOSABLE) ×2
IV KIT MINILOC 20X1 SAFETY (NEEDLE) IMPLANT
KIT PORT POWER 8FR ISP CVUE (Miscellaneous) ×2 IMPLANT
NDL SAFETY ECLIPSE 18X1.5 (NEEDLE) IMPLANT
NEEDLE HYPO 18GX1.5 SHARP (NEEDLE)
NEEDLE HYPO 22GX1.5 SAFETY (NEEDLE) IMPLANT
NEEDLE HYPO 25X1 1.5 SAFETY (NEEDLE) ×2 IMPLANT
NEEDLE SPNL 22GX3.5 QUINCKE BK (NEEDLE) IMPLANT
PACK BASIN DAY SURGERY FS (CUSTOM PROCEDURE TRAY) ×2 IMPLANT
PAD CLEANER CAUTERY TIP 5X5 (MISCELLANEOUS) ×1
PENCIL BUTTON HOLSTER BLD 10FT (ELECTRODE) ×2 IMPLANT
SLEEVE SCD COMPRESS KNEE MED (MISCELLANEOUS) IMPLANT
SUT MON AB 4-0 PC3 18 (SUTURE) ×2 IMPLANT
SUT PROLENE 2 0 SH DA (SUTURE) ×2 IMPLANT
SUT SILK 2 0 TIES 17X18 (SUTURE)
SUT SILK 2-0 18XBRD TIE BLK (SUTURE) IMPLANT
SUT VIC AB 3-0 SH 27 (SUTURE) ×1
SUT VIC AB 3-0 SH 27X BRD (SUTURE) ×1 IMPLANT
SYR 10ML LL (SYRINGE) IMPLANT
SYR 5ML LL (SYRINGE) ×2 IMPLANT
SYR CONTROL 10ML LL (SYRINGE) ×4 IMPLANT
TOWEL OR 17X24 6PK STRL BLUE (TOWEL DISPOSABLE) ×4 IMPLANT
TOWEL OR NON WOVEN STRL DISP B (DISPOSABLE) ×2 IMPLANT
TUBE CONNECTING 20X1/4 (TUBING) IMPLANT
YANKAUER SUCT BULB TIP NO VENT (SUCTIONS) IMPLANT

## 2017-07-08 NOTE — H&P (Signed)
Kaitlyn Keith  Location: [facility] Patient #: [id number] DOB: 1965-07-08 Divorced / Language: English / Race: Black or African American Female   History of Present Illness  The patient is a 52 year old female who presents for a follow-up for Breast cancer. The patient is a 52 year old white female who is 1-1/2 years status post right breast lumpectomy and sentinel node mapping for a T2 N0 right breast cancer that was triple negative. 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 She is about 2 weeks status post excision of a couple subcutaneous nodules from the back. These both came back as metastatic breast cancer. She also complains of a new headache. She describes it as similar to her previous headache that she had with her brain metastasis. She has been started on a new medicine by the oncologist.   Allergies  Allergies Reconciled  No Known Drug Allergies   Medication History  Everolimus (7.5MG  Tablet, Oral) Active. Lisinopril-Hydrochlorothiazide (Oral) Specific strength unknown - Active. Medications Reconciled    Review of Systems  General Present- Fatigue and Night Sweats. Not Present- Appetite Loss, Chills, Fever, Weight Gain and Weight Loss. HEENT Not Present- Earache, Hearing Loss, Hoarseness, Nose Bleed, Oral Ulcers, Ringing in the Ears, Seasonal Allergies, Sinus Pain, Sore Throat, Visual Disturbances, Wears glasses/contact lenses and Yellow Eyes. Respiratory Not Present- Bloody sputum, Chronic Cough, Difficulty Breathing, Snoring and Wheezing. Breast Not Present- Breast Mass, Breast Pain, Nipple Discharge and Skin Changes. Cardiovascular Not Present- Chest Pain, Difficulty Breathing Lying Down, Leg Cramps, Palpitations, Rapid Heart Rate, Shortness of Breath and Swelling of Extremities. Gastrointestinal Not Present- Abdominal Pain, Bloating, Bloody Stool, Change in Bowel Habits, Chronic diarrhea, Constipation, Difficulty  Swallowing, Excessive gas, Gets full quickly at meals, Hemorrhoids, Indigestion, Nausea, Rectal Pain and Vomiting. Musculoskeletal Present- Joint Pain, Joint Stiffness and Muscle Pain. Not Present- Back Pain, Muscle Weakness and Swelling of Extremities. Neurological Present- Numbness and Tingling. Not Present- Decreased Memory, Fainting, Headaches, Seizures, Tremor, Trouble walking and Weakness. Psychiatric Not Present- Anxiety, Bipolar, Change in Sleep Pattern, Depression, Fearful and Frequent crying. Endocrine Not Present- Cold Intolerance, Excessive Hunger, Hair Changes, Heat Intolerance, Hot flashes and New Diabetes. Hematology Not Present- Easy Bruising, Excessive bleeding, Gland problems, HIV and Persistent Infections.  Vitals  Weight: 172.8 lb Height: 64in Body Surface Area: 1.84 m Body Mass Index: 29.66 kg/m  Temp.: 98.55F  Pulse: 74 (Regular)  P.OX: 97% (Room air) BP: 142/82 (Sitting, Left Arm, Standard)       Physical Exam  General Mental Status-Alert. General Appearance-Consistent with stated age. Hydration-Well hydrated. Voice-Normal.  Head and Neck Head-normocephalic, atraumatic with no lesions or palpable masses. Trachea-midline. Thyroid Gland Characteristics - normal size and consistency.  Eye Eyeball - Bilateral-Extraocular movements intact. Sclera/Conjunctiva - Bilateral-No scleral icterus.  Chest and Lung Exam Chest and lung exam reveals -quiet, even and easy respiratory effort with no use of accessory muscles and on auscultation, normal breath sounds, no adventitious sounds and normal vocal resonance. Inspection Chest Wall - Normal. Back - normal.  Breast Note: There is no palpable mass in either breast. There is no palpable axillary, supraclavicular, or cervical lymphadenopathy. There doesn't feel to be some firmness posteriorly in the right axilla in an area that seems to overlie the latissimus muscle. this could be scar  from her previous surgery.   Cardiovascular Cardiovascular examination reveals -normal heart sounds, regular rate and rhythm with no murmurs and normal pedal pulses bilaterally.  Abdomen Inspection Inspection of the abdomen reveals -  No Hernias. Skin - Scar - no surgical scars. Palpation/Percussion Palpation and Percussion of the abdomen reveal - Soft, Non Tender, No Rebound tenderness, No Rigidity (guarding) and No hepatosplenomegaly. Auscultation Auscultation of the abdomen reveals - Bowel sounds normal. Note: There are no palpable masses   Neurologic Neurologic evaluation reveals -alert and oriented x 3 with no impairment of recent or remote memory. Mental Status-Normal.  Musculoskeletal Normal Exam - Left-Upper Extremity Strength Normal and Lower Extremity Strength Normal. Normal Exam - Right-Upper Extremity Strength Normal and Lower Extremity Strength Normal. Note: The 2 incisions on the back are healing nicely with no sign of infection or seroma   Lymphatic Head & Neck  General Head & Neck Lymphatics: Bilateral - Description - Normal. Axillary  General Axillary Region: Bilateral - Description - Normal. Tenderness - Non Tender. Femoral & Inguinal  Generalized Femoral & Inguinal Lymphatics: Bilateral - Description - Normal. Tenderness - Non Tender.    Assessment & Plan  PRIMARY CANCER OF LOWER OUTER QUADRANT OF RIGHT FEMALE BREAST (C50.511) Impression: The patient is about 1-1/2 years status post right breast lumpectomy for breast cancer and a couple weeks status post excision of 2 metastatic breast cancer lesions from the back. At this point her treatment will be dictated by the oncologist. I will plan to see her back on a when necessary basis. I will order a head CT scan given her new symptom of headache which she describes as similar to her previous headache that developed when she had a brain metastasis Current Plans Follow up as needed  For port  placement

## 2017-07-08 NOTE — Discharge Instructions (Signed)

## 2017-07-08 NOTE — Anesthesia Postprocedure Evaluation (Signed)
Anesthesia Post Note  Patient: Kaitlyn Keith  Procedure(s) Performed: INSERTION PORT-A-CATH (Left Neck)     Patient location during evaluation: PACU Anesthesia Type: General Level of consciousness: awake and alert Pain management: pain level controlled Vital Signs Assessment: post-procedure vital signs reviewed and stable Respiratory status: spontaneous breathing, nonlabored ventilation, respiratory function stable and patient connected to nasal cannula oxygen Cardiovascular status: blood pressure returned to baseline and stable Postop Assessment: no apparent nausea or vomiting Anesthetic complications: no    Last Vitals:  Vitals:   07/08/17 1215 07/08/17 1230  BP: (!) 86/57 100/69  Pulse: 83 72  Resp: 16 17  Temp:    SpO2: 100% 100%    Last Pain:  Vitals:   07/08/17 1230  TempSrc:   PainSc: 0-No pain                 [name] DAVID

## 2017-07-08 NOTE — Interval H&P Note (Signed)
History and Physical Interval Note:  07/08/2017 10:50 AM  Kaitlyn Keith  has presented today for surgery, with the diagnosis of right breast cancer with metastatic disease  The various methods of treatment have been discussed with the patient and family. After consideration of risks, benefits and other options for treatment, the patient has consented to  Procedure(s): INSERTION PORT-A-CATH (N/A) as a surgical intervention .  The patient's history has been reviewed, patient examined, no change in status, stable for surgery.  I have reviewed the patient's chart and labs.  Questions were answered to the patient's satisfaction.     TOTH III,Lareina Espino S

## 2017-07-08 NOTE — Anesthesia Procedure Notes (Signed)
Procedure Name: LMA Insertion Date/Time: 07/08/2017 11:19 AM Performed by: Maryella Shivers, CRNA Pre-anesthesia Checklist: Patient identified, Emergency Drugs available, Suction available, Patient being monitored and Timeout performed Patient Re-evaluated:Patient Re-evaluated prior to induction Oxygen Delivery Method: Circle system utilized Preoxygenation: Pre-oxygenation with 100% oxygen Induction Type: IV induction Ventilation: Mask ventilation without difficulty LMA: LMA inserted LMA Size: 4.0 Number of attempts: 1 Airway Equipment and Method: Bite block Placement Confirmation: positive ETCO2 Tube secured with: Tape Dental Injury: Teeth and Oropharynx as per pre-operative assessment

## 2017-07-08 NOTE — Transfer of Care (Signed)
Immediate Anesthesia Transfer of Care Note  Patient: Kaitlyn Keith  Procedure(s) Performed: INSERTION PORT-A-CATH (Left Neck)  Patient Location: PACU  Anesthesia Type:General  Level of Consciousness: sedated  Airway & Oxygen Therapy: Patient Spontanous Breathing and Patient connected to face mask oxygen  Post-op Assessment: Report given to RN and Post -op Vital signs reviewed and stable  Post vital signs: Reviewed and stable  Last Vitals:  Vitals:   07/08/17 1011  BP: 125/76  Pulse: 86  Resp: 18  Temp: 36.7 C  SpO2: 100%    Last Pain:  Vitals:   07/08/17 1011  TempSrc: Oral  PainSc: 0-No pain         Complications: No apparent anesthesia complications

## 2017-07-08 NOTE — Anesthesia Preprocedure Evaluation (Signed)
Anesthesia Evaluation  Patient identified by MRN, date of birth, ID band Patient awake    Reviewed: Allergy & Precautions, NPO status , Patient's Chart, lab work & pertinent test results  Airway Mallampati: II  TM Distance: >3 FB Neck ROM: Full    Dental   Pulmonary    Pulmonary exam normal        Cardiovascular hypertension, Pt. on medications Normal cardiovascular exam     Neuro/Psych Anxiety Depression    GI/Hepatic   Endo/Other    Renal/GU      Musculoskeletal   Abdominal   Peds  Hematology   Anesthesia Other Findings   Reproductive/Obstetrics                             Anesthesia Physical Anesthesia Plan  ASA: II  Anesthesia Plan: General   Post-op Pain Management:    Induction: Intravenous  PONV Risk Score and Plan: 3 and Ondansetron and Treatment may vary due to age or medical condition  Airway Management Planned: LMA  Additional Equipment:   Intra-op Plan:   Post-operative Plan: Extubation in OR  Informed Consent: I have reviewed the patients History and Physical, chart, labs and discussed the procedure including the risks, benefits and alternatives for the proposed anesthesia with the patient or authorized representative who has indicated his/her understanding and acceptance.     Plan Discussed with: CRNA and Surgeon  Anesthesia Plan Comments:         Anesthesia Quick Evaluation

## 2017-07-08 NOTE — Op Note (Signed)
07/08/2017  11:58 AM  PATIENT:  Kaitlyn Keith  52 y.o. female  PRE-OPERATIVE DIAGNOSIS:  right breast cancer with metastatic disease  POST-OPERATIVE DIAGNOSIS:  right breast cancer with metastic disease  PROCEDURE:  Procedure(s): INSERTION PORT-A-CATH (Left)  SURGEON:  Surgeon(s) and Role:    * [name], MD - Primary  PHYSICIAN ASSISTANT:   ASSISTANTS: none   ANESTHESIA:   local and general  EBL:  Minimal   BLOOD ADMINISTERED:none  DRAINS: none   LOCAL MEDICATIONS USED:  MARCAINE     SPECIMEN:  No Specimen  DISPOSITION OF SPECIMEN:  N/A  COUNTS:  YES  TOURNIQUET:  * No tourniquets in log *  DICTATION: .Dragon Dictation   After informed consent was obtained the patient was brought to the operating room and placed in the supine position on the operating table.  After adequate induction of general anesthesia the patient's left chest and neck area were prepped with ChloraPrep, allowed to dry, and draped in usual sterile manner.  An appropriate timeout was performed.  The patient was placed in Trendelenburg position.  The area lateral to the bend of the clavicle on the left chest wall was infiltrated with quarter percent Marcaine.  A large bore needle from the port kit was used to slide beneath the bend of the clavicle on the left chest wall heading towards the sternal notch and in doing so I was able to access the left subclavian vein without difficulty.  A wire was fed through the needle using the Seldinger technique without difficulty.  The wire was confirmed in the central venous system using real-time fluoroscopy.  Next a small incision was made at the wire entry site with a 15 blade knife.  A subcutaneous pocket was created inferior to the incision by blunt finger dissection.  Next the tubing was placed on the reservoir.  The reservoir was placed in the pocket and the length of the tubing was estimated using real-time fluoroscopy and cut to the appropriate  length.  Next a sheath and dilator were fed over the wire using the Seldinger technique without difficulty.  The wire and dilator were removed.  The tubing was fed through the sheath as far as it would go and then held in place while the sheath was gently cracked and separated.  Another real-time fluoroscopy image showed the tip of the catheter to be in the distal superior vena cava.  The tubing was then permanently attached to the reservoir.  The reservoir was anchored in the pocket with 2 2-0 Prolene stitches.  The port was then aspirated and it aspirated blood easily.  The port was then flushed initially with a dilute heparin solution and then with a more concentrated heparin solution.  The subcutaneous tissue was then closed over the port with interrupted 3-0 Vicryl stitches.  The skin was closed with a running 4-0 Monocryl subcuticular stitch.  Dermabond dressings were applied.  The patient tolerated the procedure well.  At the end of the case all needle sponge and instrument counts were correct.  The patient was then awakened and taken to recovery in stable condition.  PLAN OF CARE: Discharge to home after PACU  PATIENT DISPOSITION:  PACU - hemodynamically stable.   Delay start of Pharmacological VTE agent (>24hrs) due to surgical blood loss or risk of bleeding: not applicable

## 2017-07-09 DIAGNOSIS — Z510 Encounter for antineoplastic radiation therapy: Secondary | ICD-10-CM | POA: Diagnosis not present

## 2017-07-11 ENCOUNTER — Ambulatory Visit
Admission: RE | Admit: 2017-07-11 | Discharge: 2017-07-11 | Disposition: A | Discharge status: Home / Self Care | Payer: BLUE CROSS/BLUE SHIELD | Source: Ambulatory Visit | Attending: Radiation Oncology | Admitting: Radiation Oncology

## 2017-07-11 ENCOUNTER — Encounter (HOSPITAL_BASED_OUTPATIENT_CLINIC_OR_DEPARTMENT_OTHER): Payer: Self-pay | Admitting: General Surgery

## 2017-07-11 DIAGNOSIS — Z510 Encounter for antineoplastic radiation therapy: Secondary | ICD-10-CM | POA: Diagnosis not present

## 2017-07-12 ENCOUNTER — Ambulatory Visit
Admission: RE | Admit: 2017-07-12 | Discharge: 2017-07-12 | Disposition: A | Discharge status: Home / Self Care | Payer: BLUE CROSS/BLUE SHIELD | Source: Ambulatory Visit | Attending: Radiation Oncology | Admitting: Radiation Oncology

## 2017-07-12 DIAGNOSIS — Z510 Encounter for antineoplastic radiation therapy: Secondary | ICD-10-CM | POA: Diagnosis not present

## 2017-07-13 ENCOUNTER — Ambulatory Visit
Admission: RE | Admit: 2017-07-13 | Discharge: 2017-07-13 | Disposition: A | Discharge status: Home / Self Care | Payer: BLUE CROSS/BLUE SHIELD | Source: Ambulatory Visit | Attending: Radiation Oncology | Admitting: Radiation Oncology

## 2017-07-13 DIAGNOSIS — Z510 Encounter for antineoplastic radiation therapy: Secondary | ICD-10-CM | POA: Diagnosis not present

## 2017-07-14 ENCOUNTER — Ambulatory Visit
Admission: RE | Admit: 2017-07-14 | Discharge: 2017-07-14 | Disposition: A | Discharge status: Home / Self Care | Payer: BLUE CROSS/BLUE SHIELD | Source: Ambulatory Visit | Attending: Radiation Oncology | Admitting: Radiation Oncology

## 2017-07-14 DIAGNOSIS — Z510 Encounter for antineoplastic radiation therapy: Secondary | ICD-10-CM | POA: Diagnosis not present

## 2017-07-15 ENCOUNTER — Ambulatory Visit
Admission: RE | Admit: 2017-07-15 | Discharge: 2017-07-15 | Disposition: A | Discharge status: Home / Self Care | Payer: BLUE CROSS/BLUE SHIELD | Source: Ambulatory Visit | Attending: Radiation Oncology | Admitting: Radiation Oncology

## 2017-07-15 DIAGNOSIS — Z1509 Genetic susceptibility to other malignant neoplasm: Secondary | ICD-10-CM

## 2017-07-15 DIAGNOSIS — Z510 Encounter for antineoplastic radiation therapy: Secondary | ICD-10-CM | POA: Diagnosis not present

## 2017-07-15 MED ORDER — RADIAPLEXRX EX GEL
Freq: Once | CUTANEOUS | Status: AC
  Administered 2017-07-15: 18:00:00 via TOPICAL

## 2017-07-15 MED ORDER — RADIAPLEXRX EX GEL: Freq: Once | CUTANEOUS | Status: DC

## 2017-07-18 ENCOUNTER — Ambulatory Visit
Admission: RE | Admit: 2017-07-18 | Discharge: 2017-07-18 | Disposition: A | Discharge status: Home / Self Care | Payer: BLUE CROSS/BLUE SHIELD | Source: Ambulatory Visit | Attending: Radiation Oncology | Admitting: Radiation Oncology

## 2017-07-18 ENCOUNTER — Encounter: Payer: Self-pay | Admitting: Hematology and Oncology

## 2017-07-18 DIAGNOSIS — Z510 Encounter for antineoplastic radiation therapy: Secondary | ICD-10-CM | POA: Diagnosis not present

## 2017-07-18 DIAGNOSIS — Z1509 Genetic susceptibility to other malignant neoplasm: Secondary | ICD-10-CM

## 2017-07-18 NOTE — Progress Notes (Signed)
Patient came in today to sign copay assistance application for Halaven.  Faxed application to [employer].  Fax received ok per confirmation sheet.

## 2017-07-19 ENCOUNTER — Ambulatory Visit
Admission: RE | Admit: 2017-07-19 | Discharge: 2017-07-19 | Disposition: A | Discharge status: Home / Self Care | Payer: BLUE CROSS/BLUE SHIELD | Source: Ambulatory Visit | Attending: Radiation Oncology | Admitting: Radiation Oncology

## 2017-07-19 DIAGNOSIS — Z510 Encounter for antineoplastic radiation therapy: Secondary | ICD-10-CM | POA: Diagnosis not present

## 2017-07-20 ENCOUNTER — Ambulatory Visit
Admission: RE | Admit: 2017-07-20 | Discharge: 2017-07-20 | Disposition: A | Discharge status: Home / Self Care | Payer: BLUE CROSS/BLUE SHIELD | Source: Ambulatory Visit | Attending: Radiation Oncology | Admitting: Radiation Oncology

## 2017-07-20 DIAGNOSIS — Z510 Encounter for antineoplastic radiation therapy: Secondary | ICD-10-CM | POA: Diagnosis not present

## 2017-07-21 ENCOUNTER — Ambulatory Visit
Admission: RE | Admit: 2017-07-21 | Discharge: 2017-07-21 | Disposition: A | Discharge status: Home / Self Care | Payer: BLUE CROSS/BLUE SHIELD | Source: Ambulatory Visit | Attending: Radiation Oncology | Admitting: Radiation Oncology

## 2017-07-21 DIAGNOSIS — Z510 Encounter for antineoplastic radiation therapy: Secondary | ICD-10-CM | POA: Diagnosis not present

## 2017-07-22 ENCOUNTER — Encounter: Payer: Self-pay | Admitting: Radiation Oncology

## 2017-07-22 ENCOUNTER — Ambulatory Visit
Admission: RE | Admit: 2017-07-22 | Discharge: 2017-07-22 | Disposition: A | Discharge status: Home / Self Care | Payer: BLUE CROSS/BLUE SHIELD | Source: Ambulatory Visit | Attending: Radiation Oncology | Admitting: Radiation Oncology

## 2017-07-22 DIAGNOSIS — C7989 Secondary malignant neoplasm of other specified sites: Secondary | ICD-10-CM | POA: Diagnosis not present

## 2017-07-22 DIAGNOSIS — C801 Malignant (primary) neoplasm, unspecified: Secondary | ICD-10-CM | POA: Diagnosis not present

## 2017-07-22 DIAGNOSIS — Z510 Encounter for antineoplastic radiation therapy: Secondary | ICD-10-CM | POA: Diagnosis not present

## 2017-07-25 ENCOUNTER — Telehealth: Payer: Self-pay | Admitting: Hematology and Oncology

## 2017-07-25 ENCOUNTER — Inpatient Hospital Stay (HOSPITAL_BASED_OUTPATIENT_CLINIC_OR_DEPARTMENT_OTHER): Payer: BLUE CROSS/BLUE SHIELD | Admitting: Hematology and Oncology

## 2017-07-25 ENCOUNTER — Encounter: Payer: Self-pay | Admitting: *Deleted

## 2017-07-25 ENCOUNTER — Inpatient Hospital Stay: Payer: BLUE CROSS/BLUE SHIELD

## 2017-07-25 ENCOUNTER — Inpatient Hospital Stay: Payer: BLUE CROSS/BLUE SHIELD | Attending: Hematology and Oncology

## 2017-07-25 DIAGNOSIS — C50511 Malignant neoplasm of lower-outer quadrant of right female breast: Secondary | ICD-10-CM

## 2017-07-25 DIAGNOSIS — Z5111 Encounter for antineoplastic chemotherapy: Secondary | ICD-10-CM | POA: Diagnosis present

## 2017-07-25 DIAGNOSIS — C7931 Secondary malignant neoplasm of brain: Secondary | ICD-10-CM | POA: Insufficient documentation

## 2017-07-25 DIAGNOSIS — Z171 Estrogen receptor negative status [ER-]: Principal | ICD-10-CM

## 2017-07-25 LAB — CBC WITH DIFFERENTIAL (CANCER CENTER ONLY)
Basophils Absolute: 0 10*3/uL (ref 0.0–0.1)
Basophils Relative: 1 %
EOS ABS: 0.1 10*3/uL (ref 0.0–0.5)
EOS PCT: 2 %
HCT: 40 % (ref 34.8–46.6)
Hemoglobin: 13.4 g/dL (ref 11.6–15.9)
LYMPHS ABS: 1 10*3/uL (ref 0.9–3.3)
LYMPHS PCT: 25 %
MCH: 30.7 pg (ref 25.1–34.0)
MCHC: 33.5 g/dL (ref 31.5–36.0)
MCV: 91.5 fL (ref 79.5–101.0)
MONO ABS: 0.4 10*3/uL (ref 0.1–0.9)
MONOS PCT: 10 %
Neutro Abs: 2.5 10*3/uL (ref 1.5–6.5)
Neutrophils Relative %: 62 %
PLATELETS: 156 10*3/uL (ref 145–400)
RBC: 4.37 MIL/uL (ref 3.70–5.45)
RDW: 18.5 % — AB (ref 11.2–14.5)
WBC: 4.1 10*3/uL (ref 3.9–10.3)

## 2017-07-25 LAB — CMP (CANCER CENTER ONLY)
ALT: 10 U/L (ref 0–55)
ANION GAP: 10 (ref 3–11)
AST: 13 U/L (ref 5–34)
Albumin: 3.6 g/dL (ref 3.5–5.0)
Alkaline Phosphatase: 103 U/L (ref 40–150)
BUN: 15 mg/dL (ref 7–26)
CO2: 25 mmol/L (ref 22–29)
CREATININE: 0.78 mg/dL (ref 0.60–1.10)
Calcium: 9.5 mg/dL (ref 8.4–10.4)
Chloride: 104 mmol/L (ref 98–109)
Glucose, Bld: 96 mg/dL (ref 70–140)
POTASSIUM: 3.4 mmol/L — AB (ref 3.5–5.1)
SODIUM: 139 mmol/L (ref 136–145)
Total Bilirubin: 0.6 mg/dL (ref 0.2–1.2)
Total Protein: 7.1 g/dL (ref 6.4–8.3)

## 2017-07-25 MED ORDER — ONDANSETRON HCL 4 MG/2ML IJ SOLN
INTRAMUSCULAR | Status: AC
  Filled 2017-07-25: qty 4

## 2017-07-25 MED ORDER — ERIBULIN MESYLATE CHEMO INJECTION 1 MG/2ML
2.0000 mg | Freq: Once | INTRAVENOUS | Status: AC
  Administered 2017-07-25: 2 mg via INTRAVENOUS
  Filled 2017-07-25: qty 4

## 2017-07-25 MED ORDER — ONDANSETRON HCL 4 MG/2ML IJ SOLN
8.0000 mg | Freq: Once | INTRAMUSCULAR | Status: AC
  Administered 2017-07-25: 8 mg via INTRAVENOUS

## 2017-07-25 MED ORDER — SODIUM CHLORIDE 0.9 % IV SOLN
Freq: Once | INTRAVENOUS | Status: AC
  Administered 2017-07-25: 11:00:00 via INTRAVENOUS

## 2017-07-25 MED ORDER — HEPARIN SOD (PORK) LOCK FLUSH 100 UNIT/ML IV SOLN
500.0000 [IU] | Freq: Once | INTRAVENOUS | Status: AC | PRN
  Administered 2017-07-25: 500 [IU]
  Filled 2017-07-25: qty 5

## 2017-07-25 MED ORDER — SODIUM CHLORIDE 0.9% FLUSH
10.0000 mL | INTRAVENOUS | Status: DC | PRN
  Administered 2017-07-25: 10 mL
  Filled 2017-07-25: qty 10

## 2017-07-25 NOTE — Assessment & Plan Note (Signed)
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 Neoadj chemo 12/24/14- 04/29/15 AC x 4 foll by Abraxane X 12 Rt Lumpectomy: Path CR 0/2 LN Adj XRT 07/23/15- 09/08/15  PET/CT scan 11/17/2016:Subcutaneous nodules in the neck, upper back, left arm, abdomen and pelvis  Patient progressed on Xeloda  January 2019-07/04/2017 stopped due to progression of disease  Cerebellar mass diagnosed 07/12/2016: Resection followed by stereotactic radiation 07/29/2016  Current treatment: Halaven days 1 and 8 every 3 weeks today cycle 1 day 1 Return to clinic in 1 week for toxicity check and for cycle 1 day 8    

## 2017-07-25 NOTE — Progress Notes (Signed)
Patient Care Team: Gwendel Hanson as PCP - General (Cardiology) [name], [name], [name], [name], [name], [name], NP as Nurse Practitioner (Nurse Practitioner)  DIAGNOSIS:  Encounter Diagnosis  Name Primary?  . 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%, HER-2 negative ratio 1.43      12/10/2014 Breast MRI    Right breast lower outer quadrant: 2.3 x 2.4 x 2.4 cm rim-enhancing mass abuts the pectoralis fascia but no enhancement of pectoralis muscle, second focus of artifact?'s second tissue marker clip, no lymph nodes      12/10/2014 Clinical Stage    Stage IIA: T2 N0      12/24/2014 - 04/29/2015 Neo-Adjuvant Chemotherapy    Dose dense Adriamycin and Cytoxan 4 followed by weekly Abraxane 12      05/02/2015 Breast MRI    complete radiologic response      06/16/2015 Surgery    Left Lumpectomy: Complete path Response, 0/2 LN      06/16/2015 Pathologic Stage    ypT0 ypN0      07/23/2015 - 09/05/2015 Radiation Therapy    Adjuvant RT: 50.4 Gy in 28 fractions and a boost of 10 Gy in 5 fractions to total dose of 60.4 Gy      10/24/2015 Survivorship    SCP mailed to patient in lieu of in person visit.      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  She has port placement and is healing very well from it.  Denies any anxiety related to the treatment.  REVIEW OF SYSTEMS:   Constitutional: Denies fevers, chills or abnormal weight loss Eyes: Denies blurriness of vision Ears, nose, mouth, throat, and face: Denies mucositis or sore throat Respiratory: Denies cough, dyspnea or wheezes Cardiovascular: Denies palpitation, chest discomfort Gastrointestinal:  Denies nausea, heartburn or change in bowel habits Skin: Denies abnormal skin rashes Lymphatics: Denies new lymphadenopathy or easy bruising Neurological:Denies numbness, tingling or new weaknesses Behavioral/Psych: Mood is stable, no new  changes  Extremities: No lower extremity edema  All other systems were reviewed with the patient and are negative.  I have reviewed the past medical history, past surgical history, social history and family history with the patient and they are unchanged from previous note.  ALLERGIES:  has No Known Allergies.  MEDICATIONS:  Current Outpatient Medications  Medication Sig Dispense Refill  . ALPRAZolam (XANAX) 1 MG tablet Take 2 tablets (2 mg total) by mouth at bedtime. 60 tablet 0  . cyclobenzaprine (FLEXERIL) 5 MG tablet Take 1 tablet (5 mg total) by mouth 3 (three) times daily as needed for muscle spasms. 30 tablet 0  . HYDROcodone-acetaminophen (NORCO/VICODIN) 5-325 MG tablet Take 1-2 tablets by mouth every 6 (six) hours as needed for moderate pain or severe pain. 15 tablet 0  . lisinopril-hydrochlorothiazide (PRINZIDE,ZESTORETIC) 20-12.5 MG tablet TAKE 1 TABLET BY MOUTH TWICE DAILY 180 tablet 0  . Wound Cleansers (RADIAPLEX EX) Apply topically.     No current facility-administered medications for this visit.     PHYSICAL EXAMINATION: ECOG PERFORMANCE STATUS: 1 - Symptomatic but completely ambulatory  Vitals:   07/25/17 0946  BP: 124/89  Pulse: 72  Resp: 20  Temp: 98.2 F (36.8 C)  SpO2: 98%   Filed Weights   07/25/17 0946  Weight: 176 lb (79.8 kg)    GENERAL:alert, no distress and comfortable SKIN: skin color, texture, turgor are normal, no rashes or significant lesions EYES: normal, Conjunctiva are pink and non-injected, sclera clear OROPHARYNX:no exudate, no erythema and lips, buccal mucosa, and tongue normal  NECK: supple, thyroid normal size, non-tender, without nodularity LYMPH:  no palpable lymphadenopathy in the cervical, axillary or inguinal LUNGS: clear to auscultation and percussion with normal breathing effort HEART: regular rate & rhythm and no murmurs and no lower extremity edema ABDOMEN:abdomen soft, non-tender and normal bowel sounds MUSCULOSKELETAL:no  cyanosis of digits and no clubbing  NEURO: alert & oriented x 3 with fluent speech, no focal motor/sensory deficits EXTREMITIES: No lower extremity edema  LABORATORY DATA:  I have reviewed the data as listed CMP Latest Ref Rng & Units 06/20/2017 04/04/2017 02/01/2017  Glucose 70 - 140 mg/dL 86 76 88  BUN 7 - 26 mg/dL 11 5.8(L) 7.4  Creatinine 0.60 - 1.10 mg/dL 0.71 0.8 0.7  Sodium 136 - 145 mmol/L 139 139 139  Potassium 3.3 - 4.7 mmol/L 3.5 3.7 3.6  Chloride 98 - 109 mmol/L 106 - -  CO2 22 - 29 mmol/L '26 24 24  ' Calcium 8.4 - 10.4 mg/dL 9.2 8.6 9.0  Total Protein 6.4 - 8.3 g/dL 6.9 7.0 6.5  Total Bilirubin 0.2 - 1.2 mg/dL 0.5 0.43 0.36  Alkaline Phos 40 - 150 U/L 99 131 97  AST 5 - 34 U/L '16 18 17  ' ALT 0 - 55 U/L '8 12 11    ' Lab Results  Component Value Date   WBC 4.1 07/25/2017  HGB 11.7 04/04/2017   HCT 40.0 07/25/2017   MCV 91.5 07/25/2017   PLT 156 07/25/2017   NEUTROABS 2.5 07/25/2017    ASSESSMENT & PLAN:  Breast cancer of lower-outer quadrant of right female breast (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 Neoadj chemo 12/24/14- 04/29/15 AC x 4 foll by Abraxane X 12 Rt Lumpectomy: Path CR 0/2 LN Adj XRT 07/23/15- 09/08/15  PET/CT scan 11/17/2016:Subcutaneous nodules in the neck, upper back, left arm, abdomen and pelvis  Patient progressed on Xeloda  January 2019-07/04/2017 stopped due to progression of disease  Cerebellar mass diagnosed 07/12/2016: Resection followed by stereotactic radiation 07/29/2016  Current treatment: Halaven days 1 and 8 every 3 weeks today cycle 1 day 1 Return to clinic in 1 week for toxicity check and for cycle 1 day 8      I spent 25 minutes talking to the patient of which more than half was spent in counseling and coordination of  care.  No orders of the defined types were placed in this encounter.  The patient has a good understanding of the overall plan. she agrees with it. she will call with any problems that may develop before the next visit here.   Harriette Ohara, MD 07/25/17

## 2017-07-25 NOTE — Patient Instructions (Signed)
Eribulin solution for injection  What is this medicine?  ERIBULIN (er e bu lin) is a chemotherapy drug. It is used to treat breast cancer and liposarcoma.  This medicine may be used for other purposes; ask your health care provider or pharmacist if you have questions.  COMMON BRAND NAME(S): Halaven  What should I tell my health care provider before I take this medicine?  They need to know if you have any of these conditions:  -heart disease  -history of irregular heartbeat  -kidney disease  -liver disease  -low blood counts, like low white cell, platelet, or red cell counts  -low levels of potassium or magnesium in the blood  -an unusual or allergic reaction to eribulin, other medicines, foods, dyes, or preservatives  -pregnant or trying to get pregnant  -breast-feeding  How should I use this medicine?  This medicine is for infusion into a vein. It is given by a health care professional in a hospital or clinic setting.  Talk to your pediatrician regarding the use of this medicine in children. Special care may be needed.  Overdosage: If you think you have taken too much of this medicine contact a poison control center or emergency room at once.  NOTE: This medicine is only for you. Do not share this medicine with others.  What if I miss a dose?  It is important not to miss your dose. Call your doctor or health care professional if you are unable to keep an appointment.  What may interact with this medicine?  Do not take this medicine with any of the following medications:  -amiodarone  -astemizole  -arsenic trioxide  -bepridil  -bretylium  -chloroquine  -chlorpromazine  -cisapride  -clarithromycin  -dextromethorphan,  quinidine  -disopyramide  -dofetilide  -droperidol  -dronedarone  -erythromycin  -grepafloxacin  -halofantrine  -haloperidol  -ibutilide  -levomethadyl  -mesoridazine  -methadone  -pentamidine  -procainamide  -quinidine  -pimozide  -posaconazole  -probucol  -propafenone  -saquinavir  -sotalol  -sparfloxacin  -terfenadine  -thioridazine  -troleandomycin  -ziprasidone  This list may not describe all possible interactions. Give your health care provider a list of all the medicines, herbs, non-prescription drugs, or dietary supplements you use. Also tell them if you smoke, drink alcohol, or use illegal drugs. Some items may interact with your medicine.  What should I watch for while using this medicine?  This drug may make you feel generally unwell. This is not uncommon, as chemotherapy can affect healthy cells as well as cancer cells. Report any side effects. Continue your course of treatment even though you feel ill unless your doctor tells you to stop.  Call your doctor or health care professional for advice if you get a fever, chills or sore throat, or other symptoms of a cold or flu. Do not treat yourself. This drug decreases your body's ability to fight infections. Try to avoid being around people who are sick.  This medicine may increase your risk to bruise or bleed. Call your doctor or health care professional if you notice any unusual bleeding.  You may need blood work done while you are taking this medicine.  Do not become pregnant while taking this medicine or for 2 weeks after stopping it. Women should inform their doctor if they wish to become pregnant or think they might be pregnant. Men should not father a child while taking this medicine and for 3.5 months after stopping it. There is a potential for serious side effects to an unborn child.   Talk to your health care professional or pharmacist for more information. Do not breast-feed an infant while taking this medicine or for 2 weeks after stopping  it.  What side effects may I notice from receiving this medicine?  Side effects that you should report to your doctor or health care professional as soon as possible:  -allergic reactions like skin rash, itching or hives, swelling of the face, lips, or tongue  -low blood counts - this medicine may decrease the number of white blood cells, red blood cells and platelets. You may be at increased risk for infections and bleeding.  -signs of infection - fever or chills, cough, sore throat, pain or difficulty passing urine  -signs of decreased platelets or bleeding - bruising, pinpoint red spots on the skin, black, tarry stools, blood in the urine  -signs of decreased red blood cells - unusually weak or tired, fainting spells, lightheadedness  -pain, tingling, numbness in the hands or feet  Side effects that usually do not require medical attention (report to your doctor or health care professional if they continue or are bothersome):  -constipation  -hair loss  -headache  -loss of appetite  -muscle or joint pain  -nausea, vomiting  -stomach pain  This list may not describe all possible side effects. Call your doctor for medical advice about side effects. You may report side effects to FDA at 1-800-FDA-1088.  Where should I keep my medicine?  This drug is given in a hospital or clinic and will not be stored at home.  NOTE: This sheet is a summary. It may not cover all possible information. If you have questions about this medicine, talk to your doctor, pharmacist, or health care provider.   2018 Elsevier/Gold Standard (2015-06-12 10:11:26)

## 2017-07-25 NOTE — Telephone Encounter (Signed)
07/25/17 @ 2:11 pm spoke with patient to confirm FMLA has been successfully faxed to [employer] @ [phone].  Pt also requested a copy to be mailed for personal records.

## 2017-07-27 NOTE — Progress Notes (Signed)
°  Radiation Oncology         [phone]) [phone] ________________________________  Name: Kaitlyn Keith MRN: [id number]  Date: 07/22/2017  DOB: 1966/05/14  End of Treatment Note  Diagnosis:   52 y.o. woman with 2 symptomatic subcutaneous metastases with imminent skin breakdown  Indication for treatment:  Palliative       Radiation treatment dates:   07/11/2017 - 07/22/2017  Site/dose:    1. Left Flank / 30 Gy in 10 fractions 2. Left Groin / 30 Gy in 10 fractions  Beams/energy:    1. Isodose Plan / 6X, 10X Photon 2. Isodose Plan / 6X Photon  Narrative: The patient tolerated radiation treatment relatively well.   She experienced mild fatigue and skin irritation with hyperpigmentation and itching which was managed with Radiaplex cream.  Plan: The patient has completed radiation treatment. The patient will return to radiation oncology clinic for routine followup in one month. I advised her to call or return sooner if she has any questions or concerns related to her recovery or treatment. ________________________________  Sheral Apley. Tammi Klippel, M.D.  This document serves as a record of services personally performed by Tyler Pita, MD. It was created on his behalf by Rae Lips, a trained medical scribe. The creation of this record is based on the scribe's personal observations and the provider's statements to them. This document has been checked and approved by the attending provider.

## 2017-08-01 ENCOUNTER — Inpatient Hospital Stay: Payer: BLUE CROSS/BLUE SHIELD

## 2017-08-01 ENCOUNTER — Other Ambulatory Visit: Payer: BLUE CROSS/BLUE SHIELD

## 2017-08-01 ENCOUNTER — Ambulatory Visit: Payer: BLUE CROSS/BLUE SHIELD | Admitting: Hematology and Oncology

## 2017-08-01 ENCOUNTER — Ambulatory Visit: Payer: BLUE CROSS/BLUE SHIELD

## 2017-08-01 ENCOUNTER — Inpatient Hospital Stay (HOSPITAL_BASED_OUTPATIENT_CLINIC_OR_DEPARTMENT_OTHER): Payer: BLUE CROSS/BLUE SHIELD | Admitting: Hematology and Oncology

## 2017-08-01 DIAGNOSIS — C50511 Malignant neoplasm of lower-outer quadrant of right female breast: Secondary | ICD-10-CM

## 2017-08-01 DIAGNOSIS — Z171 Estrogen receptor negative status [ER-]: Principal | ICD-10-CM

## 2017-08-01 DIAGNOSIS — C7931 Secondary malignant neoplasm of brain: Secondary | ICD-10-CM | POA: Diagnosis not present

## 2017-08-01 DIAGNOSIS — C792 Secondary malignant neoplasm of skin: Secondary | ICD-10-CM

## 2017-08-01 DIAGNOSIS — Z5111 Encounter for antineoplastic chemotherapy: Secondary | ICD-10-CM | POA: Diagnosis not present

## 2017-08-01 LAB — CBC WITH DIFFERENTIAL (CANCER CENTER ONLY)
BASOS PCT: 1 %
Basophils Absolute: 0 10*3/uL (ref 0.0–0.1)
EOS ABS: 0.1 10*3/uL (ref 0.0–0.5)
EOS PCT: 2 %
HCT: 39.7 % (ref 34.8–46.6)
Hemoglobin: 13.4 g/dL (ref 11.6–15.9)
Lymphocytes Relative: 28 %
Lymphs Abs: 1 10*3/uL (ref 0.9–3.3)
MCH: 30.6 pg (ref 25.1–34.0)
MCHC: 33.8 g/dL (ref 31.5–36.0)
MCV: 90.3 fL (ref 79.5–101.0)
MONO ABS: 0.2 10*3/uL (ref 0.1–0.9)
MONOS PCT: 5 %
NEUTROS ABS: 2.4 10*3/uL (ref 1.5–6.5)
Neutrophils Relative %: 64 %
PLATELETS: 193 10*3/uL (ref 145–400)
RBC: 4.39 MIL/uL (ref 3.70–5.45)
RDW: 20 % — AB (ref 11.2–14.5)
WBC Count: 3.7 10*3/uL — ABNORMAL LOW (ref 3.9–10.3)

## 2017-08-01 LAB — CMP (CANCER CENTER ONLY)
ALBUMIN: 3.7 g/dL (ref 3.5–5.0)
ALK PHOS: 93 U/L (ref 40–150)
ALT: 14 U/L (ref 0–55)
AST: 17 U/L (ref 5–34)
Anion gap: 9 (ref 3–11)
BILIRUBIN TOTAL: 0.5 mg/dL (ref 0.2–1.2)
BUN: 8 mg/dL (ref 7–26)
CALCIUM: 9.4 mg/dL (ref 8.4–10.4)
CO2: 27 mmol/L (ref 22–29)
CREATININE: 0.74 mg/dL (ref 0.60–1.10)
Chloride: 105 mmol/L (ref 98–109)
GFR, Est AFR Am: >60 mL/min (ref >60)
GFR, Estimated: >60 mL/min (ref >60)
GLUCOSE: 114 mg/dL (ref 70–140)
Potassium: 3.4 mmol/L — ABNORMAL LOW (ref 3.5–5.1)
SODIUM: 141 mmol/L (ref 136–145)
Total Protein: 7.1 g/dL (ref 6.4–8.3)

## 2017-08-01 MED ORDER — SODIUM CHLORIDE 0.9% FLUSH
10.0000 mL | INTRAVENOUS | Status: DC | PRN
  Administered 2017-08-01: 10 mL
  Filled 2017-08-01: qty 10

## 2017-08-01 MED ORDER — ONDANSETRON HCL 4 MG/2ML IJ SOLN
8.0000 mg | Freq: Once | INTRAMUSCULAR | Status: AC
  Administered 2017-08-01: 8 mg via INTRAVENOUS

## 2017-08-01 MED ORDER — HEPARIN SOD (PORK) LOCK FLUSH 100 UNIT/ML IV SOLN
500.0000 [IU] | Freq: Once | INTRAVENOUS | Status: AC | PRN
  Administered 2017-08-01: 500 [IU]
  Filled 2017-08-01: qty 5

## 2017-08-01 MED ORDER — ONDANSETRON HCL 4 MG/2ML IJ SOLN
INTRAMUSCULAR | Status: AC
  Filled 2017-08-01: qty 4

## 2017-08-01 MED ORDER — SODIUM CHLORIDE 0.9 % IV SOLN
2.0000 mg | Freq: Once | INTRAVENOUS | Status: AC
  Administered 2017-08-01: 2 mg via INTRAVENOUS
  Filled 2017-08-01: qty 4

## 2017-08-01 MED ORDER — SODIUM CHLORIDE 0.9 % IV SOLN
Freq: Once | INTRAVENOUS | Status: AC
  Administered 2017-08-01: 13:00:00 via INTRAVENOUS

## 2017-08-01 NOTE — Patient Instructions (Signed)
[facility] Discharge Instructions for Patients Receiving Chemotherapy  Today you received the following chemotherapy agents Halaven To help prevent nausea and vomiting after your treatment, we encourage you to take your nausea medication as prescribed.   If you develop nausea and vomiting that is not controlled by your nausea medication, call the clinic.   BELOW ARE SYMPTOMS THAT SHOULD BE REPORTED IMMEDIATELY:  *FEVER GREATER THAN 100.5 F  *CHILLS WITH OR WITHOUT FEVER  NAUSEA AND VOMITING THAT IS NOT CONTROLLED WITH YOUR NAUSEA MEDICATION  *UNUSUAL SHORTNESS OF BREATH  *UNUSUAL BRUISING OR BLEEDING  TENDERNESS IN MOUTH AND THROAT WITH OR WITHOUT PRESENCE OF ULCERS  *URINARY PROBLEMS  *BOWEL PROBLEMS  UNUSUAL RASH Items with * indicate a potential emergency and should be followed up as soon as possible.  Feel free to call the clinic should you have any questions or concerns. The clinic phone number is [phone].  Please show the CHEMO ALERT CARD at check-in to the Emergency Department and triage nurse.   

## 2017-08-01 NOTE — Progress Notes (Signed)
Patient Care Team: Gwendel Hanson as PCP - General (Cardiology) [name], [name], [name], [name], [name], [name], NP as Nurse Practitioner (Nurse Practitioner)  DIAGNOSIS:  Encounter Diagnosis  Name Primary?  . 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, estrogen receptor negative ([place])     SUMMARY OF ONCOLOGIC HISTORY:   Breast cancer of lower-outer quadrant of right female breast ([place])   12/03/2014 Mammogram    Right breast mass 1.9 cm it o'clock position 8 cm depth from the nipple      12/03/2014 Initial Diagnosis    Right breast biopsy 8:00: Invasive ductal carcinoma, grade 3, ER 0%, PR 0%, Ki-67 90%, HER-2 negative ratio 1.43      12/10/2014 Breast MRI    Right breast lower outer quadrant: 2.3 x 2.4 x 2.4 cm rim-enhancing mass abuts the pectoralis fascia but no enhancement of pectoralis muscle, second focus of artifact?'s second tissue marker clip, no lymph nodes      12/10/2014 Clinical Stage    Stage IIA: T2 N0      12/24/2014 - 04/29/2015 Neo-Adjuvant Chemotherapy    Dose dense Adriamycin and Cytoxan 4 followed by weekly Abraxane 12      05/02/2015 Breast MRI    complete radiologic response      06/16/2015 Surgery    Left Lumpectomy: Complete path Response, 0/2 LN      06/16/2015 Pathologic Stage    ypT0 ypN0      07/23/2015 - 09/05/2015 Radiation Therapy    Adjuvant RT: 50.4 Gy in 28 fractions and a boost of 10 Gy in 5 fractions to total dose of 60.4 Gy      10/24/2015 Survivorship    SCP mailed to patient in lieu of in person visit.      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  Today's cycle 1 day 8.  Her biggest side effect with chemotherapy was fatigue.  This has not interfered with her activities of daily living.  She denies any nausea vomiting.  Denies any bowel problems.  REVIEW OF SYSTEMS:   Constitutional: Denies fevers, chills or abnormal weight loss Eyes: Denies blurriness of vision Ears, nose, mouth, throat, and face: Denies mucositis or sore throat Respiratory: Denies cough, dyspnea or wheezes Cardiovascular: Denies palpitation, chest discomfort Gastrointestinal:  Denies nausea, heartburn or change in bowel habits Skin: Denies abnormal skin rashes Lymphatics: Denies new lymphadenopathy or easy  bruising Neurological:Denies numbness, tingling or new weaknesses Behavioral/Psych: Mood is stable, no new changes  Extremities: No lower extremity edema  All other systems were reviewed with the patient and are negative.  I have reviewed the past medical history, past surgical history, social history and family history with the patient and they are unchanged from previous note.  ALLERGIES:  has No Known Allergies.  MEDICATIONS:  Current Outpatient Medications  Medication Sig Dispense Refill  . ALPRAZolam (XANAX) 1 MG tablet Take 2 tablets (2 mg total) by mouth at bedtime. 60 tablet 0  . cyclobenzaprine (FLEXERIL) 5 MG tablet Take 1 tablet (5 mg total) by mouth 3 (three) times daily as needed for muscle spasms. 30 tablet 0  . HYDROcodone-acetaminophen (NORCO/VICODIN) 5-325 MG tablet Take 1-2 tablets by mouth every 6 (six) hours as needed for moderate pain or severe pain. 15 tablet 0  . lisinopril-hydrochlorothiazide (PRINZIDE,ZESTORETIC) 20-12.5 MG tablet TAKE 1 TABLET BY MOUTH TWICE DAILY 180 tablet 0  . Wound Cleansers (RADIAPLEX EX) Apply topically.     No current facility-administered medications for this visit.    Facility-Administered Medications Ordered in Other Visits  Medication Dose Route Frequency Provider Last Rate Last Dose  . eriBULin mesylate (HALAVEN) 2 mg in sodium chloride 0.9 % 100 mL chemo infusion  2 mg Intravenous Once [name], MD      . ondansetron (ZOFRAN) injection 8 mg  8 mg Intravenous Once [name], MD        PHYSICAL EXAMINATION: ECOG PERFORMANCE STATUS: 1 - Symptomatic but completely ambulatory  Vitals:   08/01/17 1148  BP: 123/88  Pulse: 78  Resp: 18  Temp: 97.8 F (36.6 C)  SpO2: 100%   Filed Weights   08/01/17 1148  Weight: 178 lb 1.6 oz (80.8 kg)    GENERAL:alert, no distress and comfortable SKIN: skin color, texture, turgor are normal, no rashes or significant lesions EYES: normal, Conjunctiva are pink and non-injected,  sclera clear OROPHARYNX:no exudate, no erythema and lips, buccal mucosa, and tongue normal  NECK: supple, thyroid normal size, non-tender, without nodularity LYMPH:  no palpable lymphadenopathy in the cervical, axillary or inguinal LUNGS: clear to auscultation and percussion with normal breathing effort HEART: regular rate & rhythm and no murmurs and no lower extremity edema ABDOMEN:abdomen soft, non-tender and normal bowel sounds MUSCULOSKELETAL:no cyanosis of digits and no clubbing  NEURO: alert & oriented x 3 with fluent speech, no focal motor/sensory deficits EXTREMITIES: No lower extremity edema  LABORATORY DATA:  I have reviewed the data as listed CMP Latest Ref Rng & Units 08/01/2017 07/25/2017 06/20/2017  Glucose 70 - 140 mg/dL 114 96 86  BUN 7 - 26 mg/dL '8 15 11  ' Creatinine 0.60 - 1.10 mg/dL 0.74 0.78 0.71  Sodium 136 - 145 mmol/L 141 139 139  Potassium 3.5 - 5.1 mmol/L 3.4(L) 3.4(L) 3.5  Chloride 98 -  109 mmol/L 105 104 106  CO2 22 - 29 mmol/L '27 25 26  ' Calcium 8.4 - 10.4 mg/dL 9.4 9.5 9.2  Total Protein 6.4 - 8.3 g/dL 7.1 7.1 6.9  Total Bilirubin 0.2 - 1.2 mg/dL 0.5 0.6 0.5  Alkaline Phos 40 - 150 U/L 93 103 99  AST 5 - 34 U/L '17 13 16  ' ALT 0 - 55 U/L '14 10 8    ' Lab Results  Component Value Date   WBC 3.7 (L) 08/01/2017   HGB 11.7 04/04/2017   HCT 39.7 08/01/2017   MCV 90.3 08/01/2017   PLT 193 08/01/2017   NEUTROABS 2.4 08/01/2017    ASSESSMENT & PLAN:  Breast cancer of lower-outer quadrant of right female breast ([place]) Right breast biopsy 12/03/2014 8:00: Invasive ductal carcinoma, grade 3, ER 0%, PR 0%, 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 Neoadj chemo 12/24/14- 04/29/15 AC x 4 foll by Abraxane X 12 Rt Lumpectomy: Path CR 0/2 LN Adj XRT 07/23/15- 09/08/15  PET/CT scan 11/17/2016:Subcutaneous nodules in the neck, upper back, left arm, abdomen and  pelvis  Patient progressed on Xeloda  January 2019-07/04/2017 stopped due to progression of disease  Cerebellar mass diagnosed 07/12/2016: Resection followed by stereotactic radiation 07/29/2016  Current treatment: Halaven days 1 and 8 every 3 weeks today cycle 1 day 8 Chemo toxicities: Severe fatigue Denies any nausea vomiting.  Return to clinic in 2 weeks for toxicity check and for cycle 2  I spent 25 minutes talking to the patient of which more than half was spent in counseling and coordination of care.  No orders of the defined types were placed in this encounter.  The patient has a good understanding of the overall plan. she agrees with it. she will call with any problems that may develop before the next visit here.   Harriette Ohara, MD 08/01/17

## 2017-08-01 NOTE — Assessment & Plan Note (Signed)
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 Neoadj chemo 12/24/14- 04/29/15 AC x 4 foll by Abraxane X 12 Rt Lumpectomy: Path CR 0/2 LN Adj XRT 07/23/15- 09/08/15  PET/CT scan 11/17/2016:Subcutaneous nodules in the neck, upper back, left arm, abdomen and pelvis  Patient progressed on Xeloda  January 2019-07/04/2017 stopped due to progression of disease  Cerebellar mass diagnosed 07/12/2016: Resection followed by stereotactic radiation 07/29/2016  Current treatment: Halaven days 1 and 8 every 3 weeks today cycle 1 day 1 Return to clinic in 1 week for toxicity check and for cycle 1 day 8    

## 2017-08-04 ENCOUNTER — Ambulatory Visit: Payer: BLUE CROSS/BLUE SHIELD

## 2017-08-04 ENCOUNTER — Other Ambulatory Visit: Payer: BLUE CROSS/BLUE SHIELD

## 2017-08-04 ENCOUNTER — Ambulatory Visit: Payer: BLUE CROSS/BLUE SHIELD | Admitting: Hematology and Oncology

## 2017-08-15 ENCOUNTER — Inpatient Hospital Stay: Payer: BLUE CROSS/BLUE SHIELD

## 2017-08-15 ENCOUNTER — Encounter: Payer: Self-pay | Admitting: *Deleted

## 2017-08-15 ENCOUNTER — Ambulatory Visit: Payer: BLUE CROSS/BLUE SHIELD | Admitting: Hematology and Oncology

## 2017-08-15 ENCOUNTER — Inpatient Hospital Stay (HOSPITAL_BASED_OUTPATIENT_CLINIC_OR_DEPARTMENT_OTHER): Payer: BLUE CROSS/BLUE SHIELD | Admitting: Hematology and Oncology

## 2017-08-15 ENCOUNTER — Other Ambulatory Visit: Payer: BLUE CROSS/BLUE SHIELD

## 2017-08-15 ENCOUNTER — Ambulatory Visit: Payer: BLUE CROSS/BLUE SHIELD

## 2017-08-15 DIAGNOSIS — C7931 Secondary malignant neoplasm of brain: Secondary | ICD-10-CM

## 2017-08-15 DIAGNOSIS — Z171 Estrogen receptor negative status [ER-]: Principal | ICD-10-CM

## 2017-08-15 DIAGNOSIS — C50511 Malignant neoplasm of lower-outer quadrant of right female breast: Secondary | ICD-10-CM

## 2017-08-15 DIAGNOSIS — Z5111 Encounter for antineoplastic chemotherapy: Secondary | ICD-10-CM | POA: Diagnosis not present

## 2017-08-15 DIAGNOSIS — C792 Secondary malignant neoplasm of skin: Secondary | ICD-10-CM

## 2017-08-15 LAB — CBC WITH DIFFERENTIAL (CANCER CENTER ONLY)
Basophils Absolute: 0 10*3/uL (ref 0.0–0.1)
Basophils Relative: 1 %
EOS PCT: 1 %
Eosinophils Absolute: 0 10*3/uL (ref 0.0–0.5)
HCT: 37.8 % (ref 34.8–46.6)
Hemoglobin: 12.8 g/dL (ref 11.6–15.9)
LYMPHS ABS: 1 10*3/uL (ref 0.9–3.3)
LYMPHS PCT: 28 %
MCH: 30.5 pg (ref 25.1–34.0)
MCHC: 33.9 g/dL (ref 31.5–36.0)
MCV: 90.1 fL (ref 79.5–101.0)
MONOS PCT: 14 %
Monocytes Absolute: 0.5 10*3/uL (ref 0.1–0.9)
Neutro Abs: 2.1 10*3/uL (ref 1.5–6.5)
Neutrophils Relative %: 56 %
PLATELETS: 179 10*3/uL (ref 145–400)
RBC: 4.19 MIL/uL (ref 3.70–5.45)
RDW: 16.5 % — AB (ref 11.2–14.5)
WBC: 3.7 10*3/uL — AB (ref 3.9–10.3)

## 2017-08-15 LAB — CMP (CANCER CENTER ONLY)
ALT: 14 U/L (ref 0–55)
AST: 18 U/L (ref 5–34)
Albumin: 3.7 g/dL (ref 3.5–5.0)
Alkaline Phosphatase: 91 U/L (ref 40–150)
Anion gap: 7 (ref 3–11)
BUN: 14 mg/dL (ref 7–26)
CHLORIDE: 106 mmol/L (ref 98–109)
CO2: 27 mmol/L (ref 22–29)
Calcium: 9.1 mg/dL (ref 8.4–10.4)
Creatinine: 0.77 mg/dL (ref 0.60–1.10)
Glucose, Bld: 106 mg/dL (ref 70–140)
POTASSIUM: 3.3 mmol/L — AB (ref 3.5–5.1)
Sodium: 140 mmol/L (ref 136–145)
TOTAL PROTEIN: 6.8 g/dL (ref 6.4–8.3)
Total Bilirubin: 0.6 mg/dL (ref 0.2–1.2)

## 2017-08-15 MED ORDER — ONDANSETRON HCL 4 MG/2ML IJ SOLN
8.0000 mg | Freq: Once | INTRAMUSCULAR | Status: AC
  Administered 2017-08-15: 8 mg via INTRAVENOUS

## 2017-08-15 MED ORDER — SODIUM CHLORIDE 0.9 % IV SOLN
2.0000 mg | Freq: Once | INTRAVENOUS | Status: AC
  Administered 2017-08-15: 2 mg via INTRAVENOUS
  Filled 2017-08-15: qty 4

## 2017-08-15 MED ORDER — ONDANSETRON HCL 4 MG/2ML IJ SOLN
INTRAMUSCULAR | Status: AC
  Filled 2017-08-15: qty 4

## 2017-08-15 MED ORDER — HEPARIN SOD (PORK) LOCK FLUSH 100 UNIT/ML IV SOLN
500.0000 [IU] | Freq: Once | INTRAVENOUS | Status: AC | PRN
Start: 2017-08-15 — End: 2017-08-15
  Administered 2017-08-15: 500 [IU]
  Filled 2017-08-15: qty 5

## 2017-08-15 MED ORDER — GABAPENTIN 100 MG PO CAPS: 100.0000 mg | ORAL_CAPSULE | Freq: Every day | ORAL | 6 refills | Status: DC

## 2017-08-15 MED ORDER — SODIUM CHLORIDE 0.9% FLUSH
10.0000 mL | INTRAVENOUS | Status: DC | PRN
  Administered 2017-08-15: 10 mL
  Filled 2017-08-15: qty 10

## 2017-08-15 MED ORDER — SODIUM CHLORIDE 0.9 % IV SOLN
Freq: Once | INTRAVENOUS | Status: AC
  Administered 2017-08-15: 15:00:00 via INTRAVENOUS

## 2017-08-15 NOTE — Assessment & Plan Note (Signed)
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 Neoadj chemo 12/24/14- 04/29/15 AC x 4 foll by Abraxane X 12 Rt Lumpectomy: Path CR 0/2 LN Adj XRT 07/23/15- 09/08/15  PET/CT scan 11/17/2016:Subcutaneous nodules in the neck, upper back, left arm, abdomen and pelvis  Patient progressed on Xeloda  January 2019-07/04/2017 stopped due to progression of disease  Cerebellar mass diagnosed 07/12/2016: Resection followed by stereotactic radiation 07/29/2016  Current treatment: Halaven days 1 and 8 every 3 weeks today cycle 2 day 1 Chemo toxicities: Severe fatigue Denies any nausea vomiting. 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

## 2017-08-15 NOTE — Progress Notes (Signed)
Patient Care Team: Gwendel Hanson as PCP - General (Cardiology) [name], [name], [name], [name], [name], [name], NP as Nurse Practitioner (Nurse Practitioner)  DIAGNOSIS:  Encounter Diagnosis  Name Primary?  . 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, estrogen receptor negative ([place])     SUMMARY OF ONCOLOGIC HISTORY:   Breast cancer of lower-outer quadrant of right female breast ([place])   12/03/2014 Mammogram    Right breast mass 1.9 cm it o'clock position 8 cm depth from the nipple      12/03/2014 Initial Diagnosis    Right breast biopsy 8:00: Invasive ductal carcinoma, grade 3, ER 0%, PR 0%, Ki-67 90%, HER-2 negative ratio 1.43      12/10/2014 Breast MRI    Right breast lower outer quadrant: 2.3 x 2.4 x 2.4 cm rim-enhancing mass abuts the pectoralis fascia but no enhancement of pectoralis muscle, second focus of artifact?'s second tissue marker clip, no lymph nodes      12/10/2014 Clinical Stage    Stage IIA: T2 N0      12/24/2014 - 04/29/2015 Neo-Adjuvant Chemotherapy    Dose dense Adriamycin and Cytoxan 4 followed by weekly Abraxane 12      05/02/2015 Breast MRI    complete radiologic response      06/16/2015 Surgery    Left Lumpectomy: Complete path Response, 0/2 LN      06/16/2015 Pathologic Stage    ypT0 ypN0      07/23/2015 - 09/05/2015 Radiation Therapy    Adjuvant RT: 50.4 Gy in 28 fractions and a boost of 10 Gy in 5 fractions to total dose of 60.4 Gy      10/24/2015 Survivorship    SCP mailed to patient in lieu of in person visit.      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 identified in BARD 1, BRCA2 and NF1      12/31/2016 Miscellaneous    Everolimus 10 mg daily for cycle 1 if she cannot tolerate will decrease to 7.5 mg daily      05/24/2017 - 07/04/2017 Chemotherapy    Xeloda 2000 mg 2 weeks on 1 week off stopped due to progression of disease based on CT scans done 06/27/2017      07/25/2017 -  Chemotherapy    Halaven days 1 and 8 every 3 weeks        CHIEF COMPLIANT: Cycle 2-day 1 Halaven  INTERVAL HISTORY: Kaitlyn Keith is a 52 year old with above-mentioned history of metastatic breast cancer currently on  palliative chemotherapy with Halaven.  Today is cycle 2-day 1.  She is tolerating chemotherapy fairly well.  Does not have any nausea vomiting.  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  I have reviewed the past medical history, past surgical history, social history and family history with the patient and they are unchanged from previous note.  ALLERGIES:  has No Known Allergies.  MEDICATIONS:  Current Outpatient Medications  Medication Sig Dispense Refill  . ALPRAZolam (XANAX) 1 MG tablet Take 2 tablets (2 mg total) by mouth at bedtime. 60 tablet 0  . cyclobenzaprine (FLEXERIL) 5 MG tablet Take 1 tablet (5 mg total) by mouth 3 (three) times daily as needed for muscle spasms. 30 tablet 0  . gabapentin (NEURONTIN) 100 MG capsule Take 1 capsule (100 mg total) by mouth at bedtime. 30 capsule 6  . HYDROcodone-acetaminophen (NORCO/VICODIN) 5-325 MG tablet Take 1-2 tablets by mouth every 6 (six) hours as needed for moderate pain or severe pain. 15 tablet 0  . lisinopril-hydrochlorothiazide (PRINZIDE,ZESTORETIC) 20-12.5 MG tablet TAKE 1 TABLET BY MOUTH TWICE DAILY 180 tablet 0  . Wound Cleansers (RADIAPLEX EX) Apply topically.     No current facility-administered medications for this visit.     PHYSICAL EXAMINATION: ECOG PERFORMANCE STATUS: 1 - Symptomatic but completely ambulatory  Vitals:   08/15/17 1428  BP: 131/85  Pulse: 70  Resp: 18  Temp: 97.8 F (36.6 C)  SpO2: 99%   Filed Weights   08/15/17 1428  Weight: 181 lb (82.1 kg)    GENERAL:alert, no distress and comfortable SKIN: skin color, texture, turgor are normal, no rashes or significant lesions EYES: normal, Conjunctiva are pink and non-injected, sclera clear OROPHARYNX:no exudate, no erythema and lips, buccal mucosa, and tongue normal  NECK: supple, thyroid normal size, non-tender, without nodularity LYMPH:  no palpable lymphadenopathy in the cervical, axillary or inguinal LUNGS: clear to auscultation and percussion with normal breathing effort HEART: regular rate & rhythm and no murmurs and no  lower extremity edema ABDOMEN:abdomen soft, non-tender and normal bowel sounds MUSCULOSKELETAL:no cyanosis of digits and no clubbing  NEURO: alert & oriented x 3 with fluent speech, no focal motor/sensory deficits EXTREMITIES: No lower extremity edema  LABORATORY DATA:  I have reviewed the data as listed CMP Latest Ref Rng & Units 08/15/2017 08/01/2017 07/25/2017  Glucose 70 - 140 mg/dL 106 114 96  BUN 7 - 26 mg/dL '14 8 15  ' Creatinine 0.60 - 1.10 mg/dL 0.77 0.74 0.78  Sodium 136 - 145 mmol/L 140 141 139  Potassium 3.5 - 5.1 mmol/L 3.3(L) 3.4(L) 3.4(L)  Chloride 98 - 109 mmol/L 106 105 104  CO2 22 - 29 mmol/L '27 27 25  ' Calcium 8.4 - 10.4 mg/dL 9.1 9.4 9.5  Total Protein 6.4 - 8.3 g/dL 6.8 7.1 7.1  Total Bilirubin 0.2 - 1.2 mg/dL 0.6 0.5 0.6  Alkaline Phos 40 - 150 U/L 91 93 103  AST 5 - 34 U/L '18 17 13  ' ALT 0 - 55  U/L '14 14 10    ' Lab Results  Component Value Date   WBC 3.7 (L) 08/15/2017   HGB 11.7 04/04/2017   HCT 37.8 08/15/2017   MCV 90.1 08/15/2017   PLT 179 08/15/2017   NEUTROABS 2.1 08/15/2017    ASSESSMENT & PLAN:  Breast cancer of lower-outer quadrant of right female breast ([place]) Right breast biopsy 12/03/2014 8:00: Invasive ductal carcinoma, grade 3, ER 0%, PR 0%, 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 Neoadj chemo 12/24/14- 04/29/15 AC x 4 foll by Abraxane X 12 Rt Lumpectomy: Path CR 0/2 LN Adj XRT 07/23/15- 09/08/15  PET/CT scan 11/17/2016:Subcutaneous nodules in the neck, upper back, left arm, abdomen and pelvis  Patient progressed on Xeloda  January 2019-07/04/2017 stopped due to progression of disease  Cerebellar mass diagnosed 07/12/2016: Resection followed by stereotactic radiation 07/29/2016  Current treatment: Halaven days 1 and 8 every 3 weeks today cycle 2 day 1 Chemo toxicities: Severe fatigue Denies any nausea vomiting. Neuropathy in  the right hand: Prescribed gabapentin 100 mg nightly  Return to clinic in 3 weeks for toxicity check and for cycle 3     I spent 25 minutes talking to the patient of which more than half was spent in counseling and coordination of care.  No orders of the defined types were placed in this encounter.  The patient has a good understanding of the overall plan. she agrees with it. she will call with any problems that may develop before the next visit here.   Harriette Ohara, MD 08/15/17

## 2017-08-15 NOTE — Patient Instructions (Signed)
[facility] Discharge Instructions for Patients Receiving Chemotherapy  Today you received the following chemotherapy agents Halaven To help prevent nausea and vomiting after your treatment, we encourage you to take your nausea medication as prescribed.   If you develop nausea and vomiting that is not controlled by your nausea medication, call the clinic.   BELOW ARE SYMPTOMS THAT SHOULD BE REPORTED IMMEDIATELY:  *FEVER GREATER THAN 100.5 F  *CHILLS WITH OR WITHOUT FEVER  NAUSEA AND VOMITING THAT IS NOT CONTROLLED WITH YOUR NAUSEA MEDICATION  *UNUSUAL SHORTNESS OF BREATH  *UNUSUAL BRUISING OR BLEEDING  TENDERNESS IN MOUTH AND THROAT WITH OR WITHOUT PRESENCE OF ULCERS  *URINARY PROBLEMS  *BOWEL PROBLEMS  UNUSUAL RASH Items with * indicate a potential emergency and should be followed up as soon as possible.  Feel free to call the clinic should you have any questions or concerns. The clinic phone number is [phone].  Please show the CHEMO ALERT CARD at check-in to the Emergency Department and triage nurse.   

## 2017-08-18 ENCOUNTER — Ambulatory Visit: Payer: BLUE CROSS/BLUE SHIELD

## 2017-08-18 ENCOUNTER — Ambulatory Visit: Payer: BLUE CROSS/BLUE SHIELD | Admitting: Hematology and Oncology

## 2017-08-18 ENCOUNTER — Other Ambulatory Visit: Payer: BLUE CROSS/BLUE SHIELD

## 2017-08-22 ENCOUNTER — Inpatient Hospital Stay: Payer: BLUE CROSS/BLUE SHIELD

## 2017-08-22 ENCOUNTER — Other Ambulatory Visit: Payer: BLUE CROSS/BLUE SHIELD

## 2017-08-22 ENCOUNTER — Ambulatory Visit: Payer: BLUE CROSS/BLUE SHIELD

## 2017-08-22 ENCOUNTER — Inpatient Hospital Stay: Payer: BLUE CROSS/BLUE SHIELD | Attending: Hematology and Oncology

## 2017-08-22 DIAGNOSIS — C50511 Malignant neoplasm of lower-outer quadrant of right female breast: Secondary | ICD-10-CM

## 2017-08-22 DIAGNOSIS — Z5111 Encounter for antineoplastic chemotherapy: Secondary | ICD-10-CM | POA: Diagnosis not present

## 2017-08-22 DIAGNOSIS — C792 Secondary malignant neoplasm of skin: Secondary | ICD-10-CM

## 2017-08-22 DIAGNOSIS — Z171 Estrogen receptor negative status [ER-]: Principal | ICD-10-CM

## 2017-08-22 DIAGNOSIS — C7931 Secondary malignant neoplasm of brain: Secondary | ICD-10-CM | POA: Insufficient documentation

## 2017-08-22 LAB — CMP (CANCER CENTER ONLY)
ALBUMIN: 3.8 g/dL (ref 3.5–5.0)
ALT: 20 U/L (ref 0–55)
AST: 22 U/L (ref 5–34)
Alkaline Phosphatase: 98 U/L (ref 40–150)
Anion gap: 8 (ref 3–11)
BUN: 10 mg/dL (ref 7–26)
CO2: 27 mmol/L (ref 22–29)
CREATININE: 0.7 mg/dL (ref 0.60–1.10)
Calcium: 9.2 mg/dL (ref 8.4–10.4)
Chloride: 104 mmol/L (ref 98–109)
GFR, Est AFR Am: >60 mL/min (ref >60)
GLUCOSE: 111 mg/dL (ref 70–140)
POTASSIUM: 3.3 mmol/L — AB (ref 3.5–5.1)
SODIUM: 139 mmol/L (ref 136–145)
Total Bilirubin: 0.4 mg/dL (ref 0.2–1.2)
Total Protein: 7.3 g/dL (ref 6.4–8.3)

## 2017-08-22 LAB — CBC WITH DIFFERENTIAL (CANCER CENTER ONLY)
Basophils Absolute: 0 10*3/uL (ref 0.0–0.1)
Basophils Relative: 0 %
EOS ABS: 0 10*3/uL (ref 0.0–0.5)
Eosinophils Relative: 0 %
HCT: 37.8 % (ref 34.8–46.6)
Hemoglobin: 12.8 g/dL (ref 11.6–15.9)
LYMPHS ABS: 1.5 10*3/uL (ref 0.9–3.3)
LYMPHS PCT: 34 %
MCH: 30.9 pg (ref 25.1–34.0)
MCHC: 33.9 g/dL (ref 31.5–36.0)
MCV: 91.3 fL (ref 79.5–101.0)
MONO ABS: 0.3 10*3/uL (ref 0.1–0.9)
MONOS PCT: 6 %
Neutro Abs: 2.7 10*3/uL (ref 1.5–6.5)
Neutrophils Relative %: 60 %
Platelet Count: 185 10*3/uL (ref 145–400)
RBC: 4.14 MIL/uL (ref 3.70–5.45)
RDW: 14.4 % (ref 11.2–14.5)
WBC: 4.5 10*3/uL (ref 3.9–10.3)

## 2017-08-22 MED ORDER — SODIUM CHLORIDE 0.9% FLUSH
10.0000 mL | INTRAVENOUS | Status: DC | PRN
  Administered 2017-08-22: 10 mL
  Filled 2017-08-22: qty 10

## 2017-08-22 MED ORDER — SODIUM CHLORIDE 0.9 % IV SOLN
2.0000 mg | Freq: Once | INTRAVENOUS | Status: AC
  Administered 2017-08-22: 2 mg via INTRAVENOUS
  Filled 2017-08-22: qty 4

## 2017-08-22 MED ORDER — HEPARIN SOD (PORK) LOCK FLUSH 100 UNIT/ML IV SOLN
500.0000 [IU] | Freq: Once | INTRAVENOUS | Status: AC | PRN
  Administered 2017-08-22: 500 [IU]
  Filled 2017-08-22: qty 5

## 2017-08-22 MED ORDER — ONDANSETRON HCL 4 MG/2ML IJ SOLN
INTRAMUSCULAR | Status: AC
  Filled 2017-08-22: qty 4

## 2017-08-22 MED ORDER — SODIUM CHLORIDE 0.9 % IV SOLN
Freq: Once | INTRAVENOUS | Status: AC
  Administered 2017-08-22: 16:00:00 via INTRAVENOUS

## 2017-08-22 MED ORDER — ONDANSETRON HCL 4 MG/2ML IJ SOLN
8.0000 mg | Freq: Once | INTRAMUSCULAR | Status: AC
  Administered 2017-08-22: 8 mg via INTRAVENOUS

## 2017-08-22 NOTE — Patient Instructions (Signed)
[facility] Discharge Instructions for Patients Receiving Chemotherapy  Today you received the following chemotherapy agents: Erlibulin mesylate (Halaven).  To help prevent nausea and vomiting after your treatment, we encourage you to take your nausea medication as prescribed.  If you develop nausea and vomiting that is not controlled by your nausea medication, call the clinic.   BELOW ARE SYMPTOMS THAT SHOULD BE REPORTED IMMEDIATELY:  *FEVER GREATER THAN 100.5 F  *CHILLS WITH OR WITHOUT FEVER  NAUSEA AND VOMITING THAT IS NOT CONTROLLED WITH YOUR NAUSEA MEDICATION  *UNUSUAL SHORTNESS OF BREATH  *UNUSUAL BRUISING OR BLEEDING  TENDERNESS IN MOUTH AND THROAT WITH OR WITHOUT PRESENCE OF ULCERS  *URINARY PROBLEMS  *BOWEL PROBLEMS  UNUSUAL RASH Items with * indicate a potential emergency and should be followed up as soon as possible.  Feel free to call the clinic should you have any questions or concerns. The clinic phone number is [phone].  Please show the [place] at check-in to the Emergency Department and triage nurse.

## 2017-08-23 ENCOUNTER — Ambulatory Visit: Admission: RE | Admit: 2017-08-23 | Payer: BLUE CROSS/BLUE SHIELD | Source: Ambulatory Visit | Admitting: Urology

## 2017-08-25 ENCOUNTER — Ambulatory Visit: Payer: BLUE CROSS/BLUE SHIELD

## 2017-08-25 ENCOUNTER — Other Ambulatory Visit: Payer: BLUE CROSS/BLUE SHIELD

## 2017-08-26 ENCOUNTER — Other Ambulatory Visit: Payer: Self-pay | Admitting: Hematology and Oncology

## 2017-08-26 DIAGNOSIS — C50511 Malignant neoplasm of lower-outer quadrant of right female breast: Secondary | ICD-10-CM

## 2017-08-27 ENCOUNTER — Other Ambulatory Visit: Payer: Self-pay | Admitting: Hematology and Oncology

## 2017-08-27 DIAGNOSIS — C50511 Malignant neoplasm of lower-outer quadrant of right female breast: Secondary | ICD-10-CM

## 2017-08-29 ENCOUNTER — Other Ambulatory Visit: Payer: Self-pay

## 2017-08-30 ENCOUNTER — Other Ambulatory Visit: Payer: Self-pay

## 2017-08-30 MED ORDER — GABAPENTIN 100 MG PO CAPS
100.0000 mg | ORAL_CAPSULE | Freq: Every day | ORAL | 6 refills | Status: DC
Start: 2017-08-30 — End: 2018-06-22

## 2017-09-01 ENCOUNTER — Other Ambulatory Visit: Payer: BLUE CROSS/BLUE SHIELD

## 2017-09-05 ENCOUNTER — Other Ambulatory Visit: Payer: BLUE CROSS/BLUE SHIELD

## 2017-09-05 ENCOUNTER — Ambulatory Visit: Payer: BLUE CROSS/BLUE SHIELD | Admitting: Hematology and Oncology

## 2017-09-05 ENCOUNTER — Inpatient Hospital Stay: Payer: BLUE CROSS/BLUE SHIELD

## 2017-09-05 ENCOUNTER — Ambulatory Visit: Payer: BLUE CROSS/BLUE SHIELD

## 2017-09-05 ENCOUNTER — Inpatient Hospital Stay (HOSPITAL_BASED_OUTPATIENT_CLINIC_OR_DEPARTMENT_OTHER): Payer: BLUE CROSS/BLUE SHIELD | Admitting: Hematology and Oncology

## 2017-09-05 DIAGNOSIS — Z171 Estrogen receptor negative status [ER-]: Principal | ICD-10-CM

## 2017-09-05 DIAGNOSIS — C50511 Malignant neoplasm of lower-outer quadrant of right female breast: Secondary | ICD-10-CM | POA: Diagnosis not present

## 2017-09-05 DIAGNOSIS — C792 Secondary malignant neoplasm of skin: Secondary | ICD-10-CM

## 2017-09-05 DIAGNOSIS — C7931 Secondary malignant neoplasm of brain: Secondary | ICD-10-CM

## 2017-09-05 DIAGNOSIS — Z5111 Encounter for antineoplastic chemotherapy: Secondary | ICD-10-CM | POA: Diagnosis not present

## 2017-09-05 LAB — CBC WITH DIFFERENTIAL (CANCER CENTER ONLY)
Basophils Absolute: 0 10*3/uL (ref 0.0–0.1)
Basophils Relative: 0 %
EOS PCT: 1 %
Eosinophils Absolute: 0 10*3/uL (ref 0.0–0.5)
HCT: 36.7 % (ref 34.8–46.6)
Hemoglobin: 12.3 g/dL (ref 11.6–15.9)
LYMPHS ABS: 1 10*3/uL (ref 0.9–3.3)
LYMPHS PCT: 30 %
MCH: 30.7 pg (ref 25.1–34.0)
MCHC: 33.5 g/dL (ref 31.5–36.0)
MCV: 91.9 fL (ref 79.5–101.0)
Monocytes Absolute: 0.4 10*3/uL (ref 0.1–0.9)
Monocytes Relative: 13 %
Neutro Abs: 1.9 10*3/uL (ref 1.5–6.5)
Neutrophils Relative %: 56 %
PLATELETS: 223 10*3/uL (ref 145–400)
RBC: 3.99 MIL/uL (ref 3.70–5.45)
RDW: 15.2 % — AB (ref 11.2–14.5)
WBC Count: 3.3 10*3/uL — ABNORMAL LOW (ref 3.9–10.3)

## 2017-09-05 LAB — CMP (CANCER CENTER ONLY)
ALBUMIN: 3.7 g/dL (ref 3.5–5.0)
ALT: 17 U/L (ref 0–55)
AST: 18 U/L (ref 5–34)
Alkaline Phosphatase: 94 U/L (ref 40–150)
Anion gap: 8 (ref 3–11)
BILIRUBIN TOTAL: 0.5 mg/dL (ref 0.2–1.2)
BUN: 10 mg/dL (ref 7–26)
CO2: 26 mmol/L (ref 22–29)
Calcium: 9.2 mg/dL (ref 8.4–10.4)
Chloride: 107 mmol/L (ref 98–109)
Creatinine: 0.72 mg/dL (ref 0.60–1.10)
GFR, Est AFR Am: >60 mL/min (ref >60)
GFR, Estimated: >60 mL/min (ref >60)
GLUCOSE: 107 mg/dL (ref 70–140)
POTASSIUM: 3.2 mmol/L — AB (ref 3.5–5.1)
Sodium: 141 mmol/L (ref 136–145)
TOTAL PROTEIN: 7.1 g/dL (ref 6.4–8.3)

## 2017-09-05 MED ORDER — ONDANSETRON HCL 4 MG/2ML IJ SOLN
INTRAMUSCULAR | Status: AC
  Filled 2017-09-05: qty 4

## 2017-09-05 MED ORDER — SODIUM CHLORIDE 0.9% FLUSH
10.0000 mL | INTRAVENOUS | Status: DC | PRN
  Administered 2017-09-05: 10 mL
  Filled 2017-09-05: qty 10

## 2017-09-05 MED ORDER — ONDANSETRON HCL 4 MG/2ML IJ SOLN
8.0000 mg | Freq: Once | INTRAMUSCULAR | Status: AC
  Administered 2017-09-05: 8 mg via INTRAVENOUS

## 2017-09-05 MED ORDER — SODIUM CHLORIDE 0.9 % IV SOLN
2.0000 mg | Freq: Once | INTRAVENOUS | Status: AC
  Administered 2017-09-05: 2 mg via INTRAVENOUS
  Filled 2017-09-05: qty 4

## 2017-09-05 MED ORDER — HEPARIN SOD (PORK) LOCK FLUSH 100 UNIT/ML IV SOLN
500.0000 [IU] | Freq: Once | INTRAVENOUS | Status: AC | PRN
  Administered 2017-09-05: 500 [IU]
  Filled 2017-09-05: qty 5

## 2017-09-05 MED ORDER — SODIUM CHLORIDE 0.9 % IV SOLN
Freq: Once | INTRAVENOUS | Status: AC
  Administered 2017-09-05: 16:00:00 via INTRAVENOUS

## 2017-09-05 NOTE — Patient Instructions (Signed)
[facility] Discharge Instructions for Patients Receiving Chemotherapy  Today you received the following chemotherapy agents Halaven  To help prevent nausea and vomiting after your treatment, we encourage you to take your nausea medication as directed If you develop nausea and vomiting that is not controlled by your nausea medication, call the clinic.   BELOW ARE SYMPTOMS THAT SHOULD BE REPORTED IMMEDIATELY:  *FEVER GREATER THAN 100.5 F  *CHILLS WITH OR WITHOUT FEVER  NAUSEA AND VOMITING THAT IS NOT CONTROLLED WITH YOUR NAUSEA MEDICATION  *UNUSUAL SHORTNESS OF BREATH  *UNUSUAL BRUISING OR BLEEDING  TENDERNESS IN MOUTH AND THROAT WITH OR WITHOUT PRESENCE OF ULCERS  *URINARY PROBLEMS  *BOWEL PROBLEMS  UNUSUAL RASH Items with * indicate a potential emergency and should be followed up as soon as possible.  Feel free to call the clinic should you have any questions or concerns. The clinic phone number is [phone].  Please show the CHEMO ALERT CARD at check-in to the Emergency Department and triage nurse.   

## 2017-09-05 NOTE — Progress Notes (Signed)
Patient Care Team: Kaitlyn Keith as PCP - General (Cardiology) Kaitlyn Kussmaul, MD as Consulting Physician (General Surgery) Kaitlyn Lose, MD as Consulting Physician (Hematology and Oncology) Kaitlyn Pray, MD as Consulting Physician (Radiation Oncology) Kaitlyn Kaufmann, RN as Registered Nurse Kaitlyn Germany, RN as Registered Nurse Kaitlyn Bouche, NP as Nurse Practitioner (Nurse Practitioner)  DIAGNOSIS:  Encounter Diagnosis  Name Primary?  . 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, estrogen receptor negative ([place])     SUMMARY OF ONCOLOGIC HISTORY:   Breast cancer of lower-outer quadrant of right female breast ([name])   12/03/2014 Mammogram    Right breast mass 1.9 cm it o'clock position 8 cm depth from the nipple      12/03/2014 Initial Diagnosis    Right breast biopsy 8:00: Invasive ductal carcinoma, grade 3, ER 0%, PR 0%, Ki-67 90%, HER-2 negative ratio 1.43      12/10/2014 Breast MRI    Right breast lower outer quadrant: 2.3 x 2.4 x 2.4 cm rim-enhancing mass abuts the pectoralis fascia but no enhancement of pectoralis muscle, second focus of artifact?'s second tissue marker clip, no lymph nodes      12/10/2014 Clinical Stage    Stage IIA: T2 N0      12/24/2014 - 04/29/2015 Neo-Adjuvant Chemotherapy    Dose dense Adriamycin and Cytoxan 4 followed by weekly Abraxane 12      05/02/2015 Breast MRI    complete radiologic response      06/16/2015 Surgery    Left Lumpectomy: Complete path Response, 0/2 LN      06/16/2015 Pathologic Stage    ypT0 ypN0      07/23/2015 - 09/05/2015 Radiation Therapy    Adjuvant RT: 50.4 Gy in 28 fractions and a boost of 10 Gy in 5 fractions to total dose of 60.4 Gy      10/24/2015 Survivorship    SCP mailed to patient in lieu of in person visit.      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 identified in BARD 1, BRCA2 and NF1      12/31/2016 Miscellaneous    Everolimus 10 mg daily for cycle 1 if she cannot tolerate will decrease to 7.5 mg daily      05/24/2017 - 07/04/2017 Chemotherapy    Xeloda 2000 mg 2 weeks on 1 week off stopped due to progression of disease based on CT scans done 06/27/2017      07/25/2017 -  Chemotherapy    Halaven days 1 and 8 every 3 weeks        CHIEF COMPLIANT: Cycle 3 Halaven  INTERVAL HISTORY: Kaitlyn Keith is a 52 year old with above-mentioned history of metastatic breast cancer currently on palliative  chemotherapy with Halaven.  She appears to be tolerating it extremely well.  Today is the beginning of cycle 3 of her chemotherapy.  She denies any nausea or vomiting.  Denies neuropathy.  REVIEW OF SYSTEMS:   Constitutional: Denies fevers, chills or abnormal weight loss Eyes: Denies blurriness of vision Ears, nose, mouth, throat, and face: Denies mucositis or sore throat Respiratory: Denies cough, dyspnea or wheezes Cardiovascular: Denies palpitation, chest discomfort Gastrointestinal:  Denies nausea, heartburn or change in bowel habits Skin: Denies abnormal skin rashes Lymphatics: Denies new lymphadenopathy or easy bruising Neurological:Denies numbness, tingling or new  weaknesses Behavioral/Psych: Mood is stable, no new changes  Extremities: No lower extremity edema Breast:  denies any pain or lumps or nodules in either breasts All other systems were reviewed with the patient and are negative.  I have reviewed the past medical history, past surgical history, social history and family history with the patient and they are unchanged from previous note.  ALLERGIES:  has No Known Allergies.  MEDICATIONS:  Current Outpatient Medications  Medication Sig Dispense Refill  . ALPRAZolam (XANAX) 1 MG tablet Take 2 tablets (2 mg total) by mouth at bedtime. 60 tablet 0  . cyclobenzaprine (FLEXERIL) 5 MG tablet Take 1 tablet (5 mg total) by mouth 3 (three) times daily as needed for muscle spasms. 30 tablet 0  . gabapentin (NEURONTIN) 100 MG capsule Take 1 capsule (100 mg total) by mouth at bedtime. 30 capsule 6  . HYDROcodone-acetaminophen (NORCO/VICODIN) 5-325 MG tablet Take 1-2 tablets by mouth every 6 (six) hours as needed for moderate pain or severe pain. 15 tablet 0  . lisinopril-hydrochlorothiazide (PRINZIDE,ZESTORETIC) 20-12.5 MG tablet TAKE 1 TABLET BY MOUTH TWICE DAILY 180 tablet 0  . Wound Cleansers (RADIAPLEX EX) Apply topically.     No current facility-administered medications for this visit.     PHYSICAL EXAMINATION: ECOG PERFORMANCE STATUS: 1 - Symptomatic but completely ambulatory  Vitals:   09/05/17 1508  BP: 123/85  Pulse: 78  Resp: 18  Temp: 98.2 F (36.8 C)  SpO2: 100%   Filed Weights   09/05/17 1508  Weight: 181 lb 8 oz (82.3 kg)    GENERAL:alert, no distress and comfortable SKIN: skin color, texture, turgor are normal, no rashes or significant lesions EYES: normal, Conjunctiva are pink and non-injected, sclera clear OROPHARYNX:no exudate, no erythema and lips, buccal mucosa, and tongue normal  NECK: supple, thyroid normal size, non-tender, without nodularity LYMPH:  no palpable lymphadenopathy in the cervical, axillary or  inguinal LUNGS: clear to auscultation and percussion with normal breathing effort HEART: regular rate & rhythm and no murmurs and no lower extremity edema ABDOMEN:abdomen soft, non-tender and normal bowel sounds MUSCULOSKELETAL:no cyanosis of digits and no clubbing  NEURO: alert & oriented x 3 with fluent speech, no focal motor/sensory deficits EXTREMITIES: No lower extremity edema BREAST: No palpable masses or nodules in either right or left breasts. No palpable axillary supraclavicular or infraclavicular adenopathy no breast tenderness or nipple discharge. (exam performed in the presence of a chaperone)  LABORATORY DATA:  I have reviewed the data as listed CMP Latest Ref Rng & Units 08/22/2017 08/15/2017 08/01/2017  Glucose 70 - 140 mg/dL 111 106 114  BUN 7 - 26 mg/dL '10 14 8  ' Creatinine 0.60 - 1.10 mg/dL 0.70 0.77 0.74  Sodium 136 - 145 mmol/L 139 140 141  Potassium 3.5 - 5.1 mmol/L 3.3(L) 3.3(L) 3.4(L)  Chloride 98 - 109 mmol/L 104 106 105  CO2 22 - 29 mmol/L 27 27  27  Calcium 8.4 - 10.4 mg/dL 9.2 9.1 9.4  Total Protein 6.4 - 8.3 g/dL 7.3 6.8 7.1  Total Bilirubin 0.2 - 1.2 mg/dL 0.4 0.6 0.5  Alkaline Phos 40 - 150 U/L 98 91 93  AST 5 - 34 U/L '22 18 17  ' ALT 0 - 55 U/L '20 14 14    ' Lab Results  Component Value Date   WBC 3.3 (L) 09/05/2017   HGB 11.7 04/04/2017   HCT 36.7 09/05/2017   MCV 91.9 09/05/2017   PLT 223 09/05/2017   NEUTROABS 1.9 09/05/2017    ASSESSMENT & PLAN:  Breast cancer of lower-outer quadrant of right female breast ([place]) Right breast biopsy 12/03/2014 8:00: Invasive ductal carcinoma, grade 3, ER 0%, PR 0%, 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 Neoadj chemo 12/24/14- 04/29/15 AC x 4 foll by Abraxane X 12 Rt Lumpectomy: Path CR 0/2 LN Adj XRT 07/23/15- 09/08/15  PET/CT scan 11/17/2016:Subcutaneous nodules in the neck, upper back, left arm,  abdomen and pelvis  Patient progressed on Xeloda January 2019-07/04/2017 stopped due to progression of disease  Cerebellar mass diagnosed 07/12/2016: Resection followed by stereotactic radiation 07/29/2016  Current treatment:Halaven days 1 and 8 every 3 weeks today cycle 3 day 1 Chemo toxicities: Severe fatigue Denies any nausea vomiting. 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  She is anxious about that.  Return to clinic in3weeksfor toxicity check and for cycle 4.  We will obtain scans prior to the next visit     Orders Placed This Encounter  Procedures  . CT Chest W Contrast    Standing Status:   Future    Standing Expiration Date:   09/05/2018    Order Specific Question:   If indicated for the ordered procedure, I authorize the administration of contrast media per Radiology protocol    Answer:   Yes    Order Specific Question:   Is patient pregnant?    Answer:   No    Order Specific Question:   Preferred imaging location?    Answer:   [facility]    Order Specific Question:   Radiology Contrast Protocol - do NOT remove file path    Answer:   [email or web address]    Order Specific Question:   Reason for Exam additional comments    Answer:   Met Breast cancer restaging  . CT Abdomen Pelvis W Contrast    Standing Status:   Future    Standing Expiration Date:   09/05/2018    Order Specific Question:   If indicated for the ordered procedure, I authorize the administration of contrast media per Radiology protocol    Answer:   Yes    Order Specific Question:   Is patient pregnant?    Answer:   No    Order Specific Question:   Preferred imaging location?    Answer:   [facility]    Order Specific Question:   Is Oral Contrast requested for this exam?    Answer:   Per Radiology protocol    Order Specific Question:   Radiology Contrast Protocol - do NOT  remove file path    Answer:   [email or web address]    Order Specific Question:   Reason for Exam additional comments    Answer:  Met Breast cancer restaging   The patient has a good understanding of the overall plan. she agrees with it. she will call with any problems that may develop before the next visit here.   Harriette Ohara, MD 09/05/17

## 2017-09-05 NOTE — Assessment & Plan Note (Signed)
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 Neoadj chemo 12/24/14- 04/29/15 AC x 4 foll by Abraxane X 12 Rt Lumpectomy: Path CR 0/2 LN Adj XRT 07/23/15- 09/08/15  PET/CT scan 11/17/2016:Subcutaneous nodules in the neck, upper back, left arm, abdomen and pelvis  Patient progressed on Xeloda January 2019-07/04/2017 stopped due to progression of disease  Cerebellar mass diagnosed 07/12/2016: Resection followed by stereotactic radiation 07/29/2016  Current treatment:Halaven days 1 and 8 every 3 weeks today cycle 3 day 1 Chemo toxicities: Severe fatigue Denies any nausea vomiting. Neuropathy in the right hand: Prescribed gabapentin 100 mg nightly  Return to clinic in3weeksfor toxicity check and for cycle 4.  We will obtain scans prior to the next visit

## 2017-09-06 ENCOUNTER — Inpatient Hospital Stay
Admission: RE | Admit: 2017-09-06 | Discharge: 2017-09-06 | Disposition: A | Discharge status: Home / Self Care | Payer: Self-pay | Source: Ambulatory Visit | Attending: Urology | Admitting: Urology

## 2017-09-06 ENCOUNTER — Telehealth: Payer: Self-pay | Admitting: Hematology and Oncology

## 2017-09-06 NOTE — Telephone Encounter (Signed)
Spoke to patient regarding upcoming May appointments.

## 2017-09-07 ENCOUNTER — Other Ambulatory Visit: Payer: BLUE CROSS/BLUE SHIELD

## 2017-09-07 NOTE — Progress Notes (Signed)
[name]. Kaitlyn Keith 52 y.o. 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  Skin change:None Headache:None Pain:None Dizziness:None Nausea/vomiting:None Ringing in ears:No Visual changes (Blurred/ diplopia double vision,blind spots, and peripheral vsion changes) None Fatigue:Yes most of the time. Cognitive changes: Alert and oriented x 3 with fluent speech,able to complete sentences without difficulty with word finding or organization of sentences.   Weight:States that she has gained weight since chemo.                Detailed Report

## 2017-09-08 ENCOUNTER — Other Ambulatory Visit: Payer: BLUE CROSS/BLUE SHIELD

## 2017-09-08 ENCOUNTER — Ambulatory Visit: Payer: BLUE CROSS/BLUE SHIELD

## 2017-09-08 ENCOUNTER — Ambulatory Visit: Payer: BLUE CROSS/BLUE SHIELD | Admitting: Adult Health

## 2017-09-10 ENCOUNTER — Ambulatory Visit
Admission: RE | Admit: 2017-09-10 | Discharge: 2017-09-10 | Disposition: A | Discharge status: Home / Self Care | Payer: BLUE CROSS/BLUE SHIELD | Source: Ambulatory Visit | Attending: Radiation Oncology | Admitting: Radiation Oncology

## 2017-09-10 DIAGNOSIS — C7931 Secondary malignant neoplasm of brain: Secondary | ICD-10-CM

## 2017-09-10 DIAGNOSIS — C7949 Secondary malignant neoplasm of other parts of nervous system: Principal | ICD-10-CM

## 2017-09-10 MED ORDER — GADOBENATE DIMEGLUMINE 529 MG/ML IV SOLN
16.0000 mL | Freq: Once | INTRAVENOUS | Status: AC | PRN
  Administered 2017-09-10: 16 mL via INTRAVENOUS

## 2017-09-12 ENCOUNTER — Ambulatory Visit: Payer: BLUE CROSS/BLUE SHIELD

## 2017-09-12 ENCOUNTER — Other Ambulatory Visit: Payer: BLUE CROSS/BLUE SHIELD

## 2017-09-12 ENCOUNTER — Inpatient Hospital Stay: Payer: BLUE CROSS/BLUE SHIELD

## 2017-09-12 ENCOUNTER — Telehealth: Payer: Self-pay

## 2017-09-12 VITALS — BP 120/77 | HR 69 | Temp 98.2°F | Resp 16

## 2017-09-12 DIAGNOSIS — Z5111 Encounter for antineoplastic chemotherapy: Secondary | ICD-10-CM | POA: Diagnosis not present

## 2017-09-12 DIAGNOSIS — C50511 Malignant neoplasm of lower-outer quadrant of right female breast: Secondary | ICD-10-CM

## 2017-09-12 DIAGNOSIS — Z171 Estrogen receptor negative status [ER-]: Principal | ICD-10-CM

## 2017-09-12 LAB — CBC WITH DIFFERENTIAL (CANCER CENTER ONLY)
BASOS ABS: 0 10*3/uL (ref 0.0–0.1)
BASOS PCT: 0 %
Eosinophils Absolute: 0 10*3/uL (ref 0.0–0.5)
Eosinophils Relative: 0 %
HEMATOCRIT: 34.6 % — AB (ref 34.8–46.6)
Hemoglobin: 11.7 g/dL (ref 11.6–15.9)
Lymphocytes Relative: 30 %
Lymphs Abs: 1.2 10*3/uL (ref 0.9–3.3)
MCH: 31 pg (ref 25.1–34.0)
MCHC: 33.7 g/dL (ref 31.5–36.0)
MCV: 91.8 fL (ref 79.5–101.0)
MONOS PCT: 5 %
Monocytes Absolute: 0.2 10*3/uL (ref 0.1–0.9)
NEUTROS PCT: 65 %
Neutro Abs: 2.7 10*3/uL (ref 1.5–6.5)
Platelet Count: 224 10*3/uL (ref 145–400)
RBC: 3.77 MIL/uL (ref 3.70–5.45)
RDW: 14.8 % — ABNORMAL HIGH (ref 11.2–14.5)
WBC Count: 4.1 10*3/uL (ref 3.9–10.3)

## 2017-09-12 LAB — CMP (CANCER CENTER ONLY)
ALBUMIN: 3.8 g/dL (ref 3.5–5.0)
ALK PHOS: 87 U/L (ref 40–150)
ALT: 14 U/L (ref 0–55)
AST: 16 U/L (ref 5–34)
Anion gap: 9 (ref 3–11)
BILIRUBIN TOTAL: 0.4 mg/dL (ref 0.2–1.2)
BUN: 12 mg/dL (ref 7–26)
CALCIUM: 9.4 mg/dL (ref 8.4–10.4)
CO2: 24 mmol/L (ref 22–29)
Chloride: 104 mmol/L (ref 98–109)
Creatinine: 0.71 mg/dL (ref 0.60–1.10)
GFR, Est AFR Am: >60 mL/min (ref >60)
GFR, Estimated: >60 mL/min (ref >60)
GLUCOSE: 87 mg/dL (ref 70–140)
Potassium: 3.6 mmol/L (ref 3.5–5.1)
Sodium: 137 mmol/L (ref 136–145)
TOTAL PROTEIN: 7.3 g/dL (ref 6.4–8.3)

## 2017-09-12 MED ORDER — SODIUM CHLORIDE 0.9 % IV SOLN
2.0000 mg | Freq: Once | INTRAVENOUS | Status: AC
  Administered 2017-09-12: 2 mg via INTRAVENOUS
  Filled 2017-09-12: qty 4

## 2017-09-12 MED ORDER — HEPARIN SOD (PORK) LOCK FLUSH 100 UNIT/ML IV SOLN
500.0000 [IU] | Freq: Once | INTRAVENOUS | Status: AC | PRN
  Administered 2017-09-12: 500 [IU]
  Filled 2017-09-12: qty 5

## 2017-09-12 MED ORDER — SODIUM CHLORIDE 0.9% FLUSH
10.0000 mL | INTRAVENOUS | Status: DC | PRN
  Administered 2017-09-12: 10 mL
  Filled 2017-09-12: qty 10

## 2017-09-12 MED ORDER — ONDANSETRON HCL 4 MG/2ML IJ SOLN
INTRAMUSCULAR | Status: AC
  Filled 2017-09-12: qty 4

## 2017-09-12 MED ORDER — SODIUM CHLORIDE 0.9 % IV SOLN
Freq: Once | INTRAVENOUS | Status: AC
  Administered 2017-09-12: 15:00:00 via INTRAVENOUS

## 2017-09-12 MED ORDER — SODIUM CHLORIDE 0.9 % IJ SOLN
10.0000 mL | Freq: Once | INTRAMUSCULAR | Status: AC
  Administered 2017-09-12: 10 mL via INTRAVENOUS
  Filled 2017-09-12: qty 10

## 2017-09-12 MED ORDER — ONDANSETRON HCL 4 MG/2ML IJ SOLN
8.0000 mg | Freq: Once | INTRAMUSCULAR | Status: AC
  Administered 2017-09-12: 8 mg via INTRAVENOUS

## 2017-09-12 NOTE — Telephone Encounter (Signed)
-----   Message from Gardenia Phlegm, NP sent at 09/12/2017  4:07 PM EDT ----- MRI normal, no cancer, please notify patient ----- Message ----- From: Interface, Rad Results In Sent: 09/10/2017   1:59 PM To: [name], MD

## 2017-09-12 NOTE — Patient Instructions (Signed)
[facility] Discharge Instructions for Patients Receiving Chemotherapy  Today you received the following chemotherapy agents Halaven  To help prevent nausea and vomiting after your treatment, we encourage you to take your nausea medication as directed If you develop nausea and vomiting that is not controlled by your nausea medication, call the clinic.   BELOW ARE SYMPTOMS THAT SHOULD BE REPORTED IMMEDIATELY:  *FEVER GREATER THAN 100.5 F  *CHILLS WITH OR WITHOUT FEVER  NAUSEA AND VOMITING THAT IS NOT CONTROLLED WITH YOUR NAUSEA MEDICATION  *UNUSUAL SHORTNESS OF BREATH  *UNUSUAL BRUISING OR BLEEDING  TENDERNESS IN MOUTH AND THROAT WITH OR WITHOUT PRESENCE OF ULCERS  *URINARY PROBLEMS  *BOWEL PROBLEMS  UNUSUAL RASH Items with * indicate a potential emergency and should be followed up as soon as possible.  Feel free to call the clinic should you have any questions or concerns. The clinic phone number is [phone].  Please show the CHEMO ALERT CARD at check-in to the Emergency Department and triage nurse.   

## 2017-09-12 NOTE — Telephone Encounter (Signed)
Patient did not have VM set up, unable to LVM about MRI results

## 2017-09-12 NOTE — Patient Instructions (Signed)

## 2017-09-14 ENCOUNTER — Encounter: Payer: Self-pay | Admitting: Radiation Oncology

## 2017-09-14 ENCOUNTER — Other Ambulatory Visit: Payer: Self-pay

## 2017-09-14 ENCOUNTER — Ambulatory Visit
Admission: RE | Admit: 2017-09-14 | Discharge: 2017-09-14 | Disposition: A | Discharge status: Home / Self Care | Payer: BLUE CROSS/BLUE SHIELD | Source: Ambulatory Visit | Attending: Radiation Oncology | Admitting: Radiation Oncology

## 2017-09-14 VITALS — BP 137/90 | HR 69 | Temp 98.1°F | Resp 18 | Wt 175.5 lb

## 2017-09-14 DIAGNOSIS — C7931 Secondary malignant neoplasm of brain: Secondary | ICD-10-CM

## 2017-09-14 NOTE — Progress Notes (Signed)
Radiation Oncology         [phone] ________________________________  Name: Kaitlyn Keith MRN: [id number]  Date: 09/14/2017  DOB: 15-Apr-1966  Follow-Up Visit Note  CC: Secundino Ginger, PA-C  Ditty, Kevan Ny, *  Diagnosis:    52 y.o. 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        ICD-10-CM   1. 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 She was diagnosed with her cancer in July 2016, and underwent neoadjuvant chemotherapy followed by right lumpectomy and adjuvant radiation. She was found to have recurrent disease in February 2018 with a right cerebellar mass, and underwent preoperative SRS treatment followed by surgical resection on 07/29/2016. She is being followed in our brain oncology conference, and her last MRI scan on 09/10/17 revealed stability of her previously treated site of disease, with stability of her resection cavity, and no new disease. She comes today to review these findings.  On review of systems, the patient reports that she is doing well overall. She denies any chest pain, shortness of breath, cough, fevers, chills, night sweats, unintended weight changes. She denies any bowel or bladder disturbances, and denies abdominal pain, nausea or vomiting. She denies headaches, new visual or auditory changes. She denies any new musculoskeletal or joint aches or pains, new skin lesions or concerns. A complete review of systems is obtained and is otherwise negative.   Past Medical History:  Past Medical History:  Diagnosis Date  . Anxiety   .  Arthritis   . Back pain   . Brain cancer ([place])    brian met from triple negative breast ca  . Breast cancer ([place])   . Breast cancer of lower-outer quadrant of right female breast ([place]) 12/05/2014  . Depression   . FH: chemotherapy 12/2014-04/2015  . Genetic testing 12/10/2016   Kaitlyn Keith underwent genetic counseling and testing for hereditary cancer syndromes on 11/18/2016. 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 Mutations in MSH6 are associated with a hereditary cancer syndrome called Lynch syndrome. For more detailed discussion, please see genetic counseling documentation from 12/10/2016.  Testing was perfo  . Headache    due to brain cancer, no longer having them  . Hot flashes   . Hypertension   . 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 Mutations in MSH6 are associated with a hereditary cancer syndrome called Lynch syndrome. For more detailed discussion, please see genetic counseling documentation from 12/10/2016.  Testing was perfo  . 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 Radiation 07/23/15-09/05/15   right breast 50.4 Gy, boost of 10 Gy    Past Surgical History: Past Surgical History:  Procedure Laterality Date  . APPLICATION OF CRANIAL NAVIGATION Right 07/27/2016   Procedure: APPLICATION OF CRANIAL NAVIGATION;  Surgeon: Kevan Ny Ditty, MD;  Location: [place];  Service: Neurosurgery;  Laterality: Right;  . BIOPSY OF SKIN SUBCUTANEOUS TISSUE AND/OR MUCOUS MEMBRANE Left 12/24/2016   Procedure: OPEN BIOPSY LESIONS ON LEFT LOWER BACK AND SHOULDER BLADE;  Surgeon: [name], MD;  Location: [place];  Service: General;  Laterality: Left;  . BREAST LUMPECTOMY WITH NEEDLE LOCALIZATION AND  AXILLARY SENTINEL LYMPH NODE BX Right 06/16/2015   Procedure: BREAST LUMPECTOMY WITH NEEDLE LOCALIZATION AND AXILLARY SENTINEL LYMPH NODE BX;  Surgeon: [name], MD;  Location: [place];  Service: General;  Laterality: Right;  . BREAST REDUCTION SURGERY    . CRANIOTOMY Right 07/27/2016   Procedure: Right Suboccipital craniotomy for tumor resection with stereotactic navigation;  Surgeon: Kevan Ny Ditty, MD;  Location: [place];  Service: Neurosurgery;  Laterality: Right;  . PORT-A-CATH REMOVAL N/A 06/16/2015   Procedure: REMOVAL PORT-A-CATH;  Surgeon: [name], MD;  Location: [place];  Service: General;  Laterality: N/A;  . PORTACATH PLACEMENT N/A 12/23/2014   Procedure: INSERTION PORT-A-CATH;  Surgeon: [name], MD;  Location: [place];  Service: General;  Laterality: N/A;  . PORTACATH PLACEMENT Left 07/08/2017   Procedure: INSERTION PORT-A-CATH;  Surgeon: [name], MD;  Location: [facility];  Service: General;  Laterality: Left;    Social History:  Social History   Socioeconomic History  . Marital status: Divorced    Spouse name: Not on file  . Number of children: 1  . Years of education: Not on file  . Highest education level: Not on file  Occupational History  . Not on file  Social Needs  . Financial resource strain: Not on file  . Food insecurity:    Worry: Not on file    Inability: Not on file  . Transportation needs:    Medical: Not on file    Non-medical: Not on file  Tobacco Use  . Smoking status: Never Smoker  . Smokeless tobacco: Never Used  Substance and Sexual Activity  . Alcohol use: Yes    Comment: social  . Drug use: No  . Sexual activity: Yes  Lifestyle  . Physical activity:    Days per week: Not on file    Minutes per session: Not on file  . Stress: Not on file  Relationships  . Social connections:    Talks on phone: Not on file    Gets together: Not on file    Attends  religious service: Not on file    Active member of club or organization: Not on file    Attends meetings of clubs or organizations: Not on file    Relationship status: Not on file  . Intimate partner violence:    Fear of current or ex partner: Not on file    Emotionally abused: Not on file    Physically abused: Not on file    Forced sexual activity: Not on file  Other Topics Concern  . Not on file  Social History Narrative  . Not on file  The patient is divorced. She has worked for [employer] firm, and is accompanied today by her sister  Family History: Family History  Problem Relation Age of Onset  . Aneurysm Mother 48       d.55  . Heart attack Father 2       d.62  . Endometrial cancer Sister 59  . Lung cancer Maternal Uncle        d.68s  . Cancer Maternal Grandmother  unspecified type-possibly stomach d.88s                          ALLERGIES:  has No Known Allergies.  Meds: Current Outpatient Medications  Medication Sig Dispense Refill  . ALPRAZolam (XANAX) 1 MG tablet Take 2 tablets (2 mg total) by mouth at bedtime. 60 tablet 0  . cyclobenzaprine (FLEXERIL) 5 MG tablet Take 1 tablet (5 mg total) by mouth 3 (three) times daily as needed for muscle spasms. 30 tablet 0  . gabapentin (NEURONTIN) 100 MG capsule Take 1 capsule (100 mg total) by mouth at bedtime. 30 capsule 6  . HYDROcodone-acetaminophen (NORCO/VICODIN) 5-325 MG tablet Take 1-2 tablets by mouth every 6 (six) hours as needed for moderate pain or severe pain. 15 tablet 0  . lisinopril-hydrochlorothiazide (PRINZIDE,ZESTORETIC) 20-12.5 MG tablet TAKE 1 TABLET BY MOUTH TWICE DAILY 180 tablet 0  . Wound Cleansers (RADIAPLEX EX) Apply topically.     No current facility-administered medications for this encounter.     Physical Findings:   weight is 175 lb 8 oz (79.6 kg). Her oral temperature is 98.1 F (36.7 C). Her blood pressure is 137/90 and her pulse is 69. Her respiration is 18 and oxygen  saturation is 100%.   Pain Assessment Pain Score: 0-No pain/10  In general this is a well appearing African American female in no acute distress. She's alert and oriented x4 and appropriate throughout the examination. Cardiopulmonary assessment is negative for acute distress and she exhibits normal effort.    Lab Findings: Lab Results  Component Value Date   WBC 4.1 09/12/2017   WBC 4.4 04/04/2017   WBC 4.9 12/24/2016   HGB 11.7 09/12/2017   HGB 11.7 04/04/2017   HCT 34.6 (L) 09/12/2017   HCT 35.9 04/04/2017   PLT 224 09/12/2017   PLT 207 04/04/2017    Lab Results  Component Value Date   NA 137 09/12/2017   NA 139 04/04/2017   K 3.6 09/12/2017   K 3.7 04/04/2017   CHLORIDE 108 04/04/2017   CO2 24 09/12/2017   CO2 24 04/04/2017   GLUCOSE 87 09/12/2017   GLUCOSE 76 04/04/2017   BUN 12 09/12/2017   BUN 5.8 (L) 04/04/2017   CREATININE 0.71 09/12/2017   CREATININE 0.8 04/04/2017   BILITOT 0.4 09/12/2017   BILITOT 0.43 04/04/2017   ALKPHOS 87 09/12/2017   ALKPHOS 131 04/04/2017   AST 16 09/12/2017   AST 18 04/04/2017   ALT 14 09/12/2017   ALT 12 04/04/2017   PROT 7.3 09/12/2017   PROT 7.0 04/04/2017   ALBUMIN 3.8 09/12/2017   ALBUMIN 3.0 (L) 04/04/2017   CALCIUM 9.4 09/12/2017   CALCIUM 8.6 04/04/2017   ANIONGAP 9 09/12/2017    Radiographic Findings: Mr Jeri Cos EX Contrast  Result Date: 09/10/2017 CLINICAL DATA:  Restaging of metastatic breast cancer. Right cerebellar metastasis treated with preoperative SRS and surgical resection in 07/2016. EXAM: MRI HEAD WITHOUT AND WITH CONTRAST TECHNIQUE: Multiplanar, multiecho pulse sequences of the brain and surrounding structures were obtained without and with intravenous contrast. CONTRAST:  72m MULTIHANCE GADOBENATE DIMEGLUMINE 529 MG/ML IV SOLN COMPARISON:  05/26/2017 FINDINGS: BRAIN New Lesions: None. Larger lesions: None. Stable or Smaller lesions: None. Other Brain findings: Right cerebellar resection site is unchanged  in appearance with mild gliosis and encephalomalacia. There is unchanged postoperative dural enhancement without cerebellar parenchymal enhancement. There is no evidence of acute infarct, intracranial hemorrhage, mass, midline shift, or extra-axial fluid collection.  The ventricles are normal in size. A few punctate foci of cerebral white matter T2 hyperintensity are unchanged and nonspecific. Vascular: Major intracranial vascular flow voids are preserved. Skull and upper cervical spine: Chronic anterolisthesis of C2 on C3. Unchanged T1 hypointensity/sclerosis involving the C4 and C5 vertebrae. No suspicious skull lesion. Sinuses/Orbits: No acute finding. Other: None. IMPRESSION: 1. No evidence of new/recurrent brain metastases. 2. Stable post treatment appearance of the right cerebellum. Electronically Signed   By: Logan Bores M.D.   On: 09/10/2017 13:57    Impression/Plan: 1. 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 Patient appears to be radiographically stable, her MRIs reviewed from the morning conference, and we would recommend she have repeat MRI in 3 months time. She is in agreement with this plan. She will also continue with Halaven under the care of  Dr. Lindi Adie. We will follow this as well as the next interval scan.      Carola Rhine, PAC

## 2017-09-15 ENCOUNTER — Other Ambulatory Visit: Payer: BLUE CROSS/BLUE SHIELD

## 2017-09-15 ENCOUNTER — Ambulatory Visit: Payer: BLUE CROSS/BLUE SHIELD

## 2017-09-19 ENCOUNTER — Telehealth: Payer: Self-pay | Admitting: Hematology and Oncology

## 2017-09-19 ENCOUNTER — Other Ambulatory Visit: Payer: Self-pay | Admitting: Radiation Therapy

## 2017-09-19 DIAGNOSIS — C7949 Secondary malignant neoplasm of other parts of nervous system: Principal | ICD-10-CM

## 2017-09-19 DIAGNOSIS — C7931 Secondary malignant neoplasm of brain: Secondary | ICD-10-CM

## 2017-09-19 NOTE — Telephone Encounter (Signed)
Spoke with patient informing FMLA paperwork was successfully faxed to [employer] at [phone]. Mailed copy to address on file per patient request.

## 2017-09-22 ENCOUNTER — Telehealth: Payer: Self-pay | Admitting: Hematology and Oncology

## 2017-09-22 ENCOUNTER — Telehealth: Payer: Self-pay

## 2017-09-22 NOTE — Telephone Encounter (Signed)
Called pt with date/times for CT scans. Instructions given. NPO 4hrs. Prior. She is picking up contrast today.  Cyndia Bent RN

## 2017-09-22 NOTE — Telephone Encounter (Signed)
Spoke to patient regarding upcoming may appointments per 5/2 sch message.

## 2017-09-23 ENCOUNTER — Ambulatory Visit (HOSPITAL_COMMUNITY)
Admission: RE | Admit: 2017-09-23 | Discharge: 2017-09-23 | Disposition: A | Discharge status: Home / Self Care | Payer: BLUE CROSS/BLUE SHIELD | Source: Ambulatory Visit | Attending: Hematology and Oncology | Admitting: Hematology and Oncology

## 2017-09-23 DIAGNOSIS — R590 Localized enlarged lymph nodes: Secondary | ICD-10-CM | POA: Diagnosis not present

## 2017-09-23 DIAGNOSIS — R222 Localized swelling, mass and lump, trunk: Secondary | ICD-10-CM | POA: Insufficient documentation

## 2017-09-23 DIAGNOSIS — C50511 Malignant neoplasm of lower-outer quadrant of right female breast: Secondary | ICD-10-CM | POA: Diagnosis not present

## 2017-09-23 DIAGNOSIS — Z171 Estrogen receptor negative status [ER-]: Secondary | ICD-10-CM | POA: Diagnosis present

## 2017-09-23 MED ORDER — IOHEXOL 300 MG/ML  SOLN
100.0000 mL | Freq: Once | INTRAMUSCULAR | Status: AC | PRN
  Administered 2017-09-23: 100 mL via INTRAVENOUS

## 2017-09-26 ENCOUNTER — Inpatient Hospital Stay: Payer: BLUE CROSS/BLUE SHIELD

## 2017-09-26 ENCOUNTER — Encounter: Payer: Self-pay | Admitting: *Deleted

## 2017-09-26 ENCOUNTER — Inpatient Hospital Stay (HOSPITAL_BASED_OUTPATIENT_CLINIC_OR_DEPARTMENT_OTHER): Payer: BLUE CROSS/BLUE SHIELD | Admitting: Hematology and Oncology

## 2017-09-26 ENCOUNTER — Inpatient Hospital Stay: Payer: BLUE CROSS/BLUE SHIELD | Attending: Hematology and Oncology

## 2017-09-26 DIAGNOSIS — Z171 Estrogen receptor negative status [ER-]: Principal | ICD-10-CM

## 2017-09-26 DIAGNOSIS — C792 Secondary malignant neoplasm of skin: Secondary | ICD-10-CM

## 2017-09-26 DIAGNOSIS — C50511 Malignant neoplasm of lower-outer quadrant of right female breast: Secondary | ICD-10-CM | POA: Diagnosis not present

## 2017-09-26 DIAGNOSIS — C7931 Secondary malignant neoplasm of brain: Secondary | ICD-10-CM | POA: Diagnosis present

## 2017-09-26 DIAGNOSIS — Z5111 Encounter for antineoplastic chemotherapy: Secondary | ICD-10-CM | POA: Insufficient documentation

## 2017-09-26 LAB — CMP (CANCER CENTER ONLY)
ALT: 15 U/L (ref 0–55)
AST: 19 U/L (ref 5–34)
Albumin: 4 g/dL (ref 3.5–5.0)
Alkaline Phosphatase: 96 U/L (ref 40–150)
Anion gap: 5 (ref 3–11)
BILIRUBIN TOTAL: 0.5 mg/dL (ref 0.2–1.2)
BUN: 15 mg/dL (ref 7–26)
CHLORIDE: 105 mmol/L (ref 98–109)
CO2: 28 mmol/L (ref 22–29)
CREATININE: 0.77 mg/dL (ref 0.60–1.10)
Calcium: 9.4 mg/dL (ref 8.4–10.4)
Glucose, Bld: 84 mg/dL (ref 70–140)
POTASSIUM: 3.2 mmol/L — AB (ref 3.5–5.1)
Sodium: 138 mmol/L (ref 136–145)
TOTAL PROTEIN: 7.4 g/dL (ref 6.4–8.3)

## 2017-09-26 LAB — CBC WITH DIFFERENTIAL (CANCER CENTER ONLY)
BASOS ABS: 0 10*3/uL (ref 0.0–0.1)
Basophils Relative: 1 %
EOS ABS: 0 10*3/uL (ref 0.0–0.5)
EOS PCT: 1 %
HCT: 36.4 % (ref 34.8–46.6)
HEMOGLOBIN: 12.5 g/dL (ref 11.6–15.9)
LYMPHS ABS: 1.4 10*3/uL (ref 0.9–3.3)
LYMPHS PCT: 33 %
MCH: 31.1 pg (ref 25.1–34.0)
MCHC: 34.3 g/dL (ref 31.5–36.0)
MCV: 90.6 fL (ref 79.5–101.0)
Monocytes Absolute: 0.5 10*3/uL (ref 0.1–0.9)
Monocytes Relative: 12 %
NEUTROS PCT: 53 %
Neutro Abs: 2.3 10*3/uL (ref 1.5–6.5)
PLATELETS: 233 10*3/uL (ref 145–400)
RBC: 4.02 MIL/uL (ref 3.70–5.45)
RDW: 15 % — ABNORMAL HIGH (ref 11.2–14.5)
WBC: 4.1 10*3/uL (ref 3.9–10.3)

## 2017-09-26 MED ORDER — SODIUM CHLORIDE 0.9 % IV SOLN
2.0000 mg | Freq: Once | INTRAVENOUS | Status: AC
  Administered 2017-09-26: 2 mg via INTRAVENOUS
  Filled 2017-09-26: qty 4

## 2017-09-26 MED ORDER — HEPARIN SOD (PORK) LOCK FLUSH 100 UNIT/ML IV SOLN
500.0000 [IU] | Freq: Once | INTRAVENOUS | Status: AC | PRN
  Administered 2017-09-26: 500 [IU]
  Filled 2017-09-26: qty 5

## 2017-09-26 MED ORDER — SODIUM CHLORIDE 0.9% FLUSH
10.0000 mL | INTRAVENOUS | Status: DC | PRN
  Administered 2017-09-26: 10 mL
  Filled 2017-09-26: qty 10

## 2017-09-26 MED ORDER — SODIUM CHLORIDE 0.9 % IV SOLN
Freq: Once | INTRAVENOUS | Status: AC
  Administered 2017-09-26: 16:00:00 via INTRAVENOUS

## 2017-09-26 MED ORDER — ONDANSETRON HCL 4 MG/2ML IJ SOLN
8.0000 mg | Freq: Once | INTRAMUSCULAR | Status: AC
  Administered 2017-09-26: 8 mg via INTRAVENOUS

## 2017-09-26 MED ORDER — ONDANSETRON HCL 4 MG/2ML IJ SOLN
INTRAMUSCULAR | Status: AC
  Filled 2017-09-26: qty 4

## 2017-09-26 NOTE — Assessment & Plan Note (Signed)
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 Neoadj chemo 12/24/14- 04/29/15 AC x 4 foll by Abraxane X 12 Rt Lumpectomy: Path CR 0/2 LN Adj XRT 07/23/15- 09/08/15  PET/CT scan 11/17/2016:Subcutaneous nodules in the neck, upper back, left arm, abdomen and pelvis  Patient progressed on Xeloda January 2019-07/04/2017 stopped due to progression of disease  Cerebellar mass diagnosed 07/12/2016: Resection followed by stereotactic radiation 07/29/2016  Current treatment:Halaven days 1 and 8 every 3 weeks today cycle 4 day 1 Chemo toxicities: Severe fatigue Denies any nausea vomiting. 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  Subcutaneous nodules decreased from 2.6 cm to 1.3 cm another nodule decreased from 17m to 9 mm.  Mesenteric nodule stable.  I discussed with the patient that this represents significant response to treatment.  Our plan is to continue with current treatment for 3 more cycles and then repeat another scan.  Return to clinic in3weeksfor toxicity check and for cycle 4.

## 2017-09-26 NOTE — Progress Notes (Signed)
Patient Care Team: Gwendel Hanson as PCP - General (Cardiology) [name], [name], [name], [name], [name], [name], NP as Nurse Practitioner (Nurse Practitioner)  DIAGNOSIS:  Encounter Diagnosis  Name Primary?  . 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, estrogen receptor negative ([place])     SUMMARY OF ONCOLOGIC HISTORY:   Breast cancer of lower-outer quadrant of right female breast ([place])   12/03/2014 Mammogram    Right breast mass 1.9 cm it o'clock position 8 cm depth from the nipple      12/03/2014 Initial Diagnosis    Right breast biopsy 8:00: Invasive ductal carcinoma, grade 3, ER 0%, PR 0%, Ki-67 90%, HER-2 negative ratio 1.43      12/10/2014 Breast MRI    Right breast lower outer quadrant: 2.3 x 2.4 x 2.4 cm rim-enhancing mass abuts the pectoralis fascia but no enhancement of pectoralis muscle, second focus of artifact?'s second tissue marker clip, no lymph nodes      12/10/2014 Clinical Stage    Stage IIA: T2 N0      12/24/2014 - 04/29/2015 Neo-Adjuvant Chemotherapy    Dose dense Adriamycin and Cytoxan 4 followed by weekly Abraxane 12      05/02/2015 Breast MRI    complete radiologic response      06/16/2015 Surgery    Left Lumpectomy: Complete path Response, 0/2 LN      06/16/2015 Pathologic Stage    ypT0 ypN0      07/23/2015 - 09/05/2015 Radiation Therapy    Adjuvant RT: 50.4 Gy in 28 fractions and a boost of 10 Gy in 5 fractions to total dose of 60.4 Gy      10/24/2015 Survivorship    SCP mailed to patient in lieu of in person visit.      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 identified in BARD 1, BRCA2 and NF1      12/31/2016 Miscellaneous    Everolimus 10 mg daily for cycle 1 if she cannot tolerate will decrease to 7.5 mg daily      05/24/2017 - 07/04/2017 Chemotherapy    Xeloda 2000 mg 2 weeks on 1 week off stopped due to progression of disease based on CT scans done 06/27/2017      07/25/2017 -  Chemotherapy    Halaven days 1 and 8 every 3 weeks        CHIEF COMPLIANT: Cycle 4 Halaven and to follow-up on recently conducted CT scans  INTERVAL HISTORY: Kaitlyn Keith is a 52 year old with above-mentioned  history of metastatic breast cancer triple negative disease who is currently on palliative chemotherapy with Halaven.  She underwent CT scans and is here today to discuss results.  She had a good response to chemotherapy with shrinkage of the subcutaneous nodules by about 50%.  The rest of the nodules appear to be stable.  No evidence of bone metastatic disease.  She is extremely thrilled to hear these results.  She is tolerating chemotherapy extremely well.  Denies any nausea vomiting.  REVIEW OF SYSTEMS:   Constitutional: Denies fevers, chills or abnormal weight loss Eyes: Denies blurriness of vision Ears, nose, mouth, throat, and face: Denies mucositis or sore throat Respiratory:  Denies cough, dyspnea or wheezes Cardiovascular: Denies palpitation, chest discomfort Gastrointestinal:  Denies nausea, heartburn or change in bowel habits Skin: Denies abnormal skin rashes Lymphatics: Denies new lymphadenopathy or easy bruising Neurological:Denies numbness, tingling or new weaknesses Behavioral/Psych: Mood is stable, no new changes  Extremities: No lower extremity edema  All other systems were reviewed with the patient and are negative.  I have reviewed the past medical history, past surgical history, social history and family history with the patient and they are unchanged from previous note.  ALLERGIES:  has No Known Allergies.  MEDICATIONS:  Current Outpatient Medications  Medication Sig Dispense Refill  . ALPRAZolam (XANAX) 1 MG tablet Take 2 tablets (2 mg total) by mouth at bedtime. 60 tablet 0  . cyclobenzaprine (FLEXERIL) 5 MG tablet Take 1 tablet (5 mg total) by mouth 3 (three) times daily as needed for muscle spasms. (Patient not taking: Reported on 09/14/2017) 30 tablet 0  . gabapentin (NEURONTIN) 100 MG capsule Take 1 capsule (100 mg total) by mouth at bedtime. 30 capsule 6  . HYDROcodone-acetaminophen (NORCO/VICODIN) 5-325 MG tablet Take 1-2 tablets by mouth every 6 (six) hours as needed for moderate pain or severe pain. 15 tablet 0  . lidocaine-prilocaine (EMLA) cream APPLY TO AFFECTED AREA ONCE AS DIRECTED  3  . lisinopril-hydrochlorothiazide (PRINZIDE,ZESTORETIC) 20-12.5 MG tablet TAKE 1 TABLET BY MOUTH TWICE DAILY 180 tablet 0  . Wound Cleansers (RADIAPLEX EX) Apply topically.     No current facility-administered medications for this visit.    Facility-Administered Medications Ordered in Other Visits  Medication Dose Route Frequency Provider Last Rate Last Dose  . eriBULin mesylate (HALAVEN) 2 mg in sodium chloride 0.9 % 100 mL chemo infusion  2 mg Intravenous Once [name], MD      . heparin lock flush 100 unit/mL  500 Units Intracatheter Once PRN  [name], MD      . sodium chloride flush (NS) 0.9 % injection 10 mL  10 mL Intracatheter PRN [name], MD        PHYSICAL EXAMINATION: ECOG PERFORMANCE STATUS: 1 - Symptomatic but completely ambulatory  Vitals:   09/26/17 1532  BP: (!) 141/101  Pulse: 61  Resp: 18  Temp: 98.4 F (36.9 C)  SpO2: 99%   Filed Weights   09/26/17 1532  Weight: 177 lb (80.3 kg)    GENERAL:alert, no distress and comfortable SKIN: skin color, texture, turgor are normal, no rashes or significant lesions EYES: normal, Conjunctiva are pink and non-injected, sclera clear OROPHARYNX:no exudate, no erythema and lips, buccal mucosa, and tongue normal  NECK: supple, thyroid normal size, non-tender, without nodularity LYMPH:  no palpable lymphadenopathy in the cervical, axillary or inguinal LUNGS: clear to auscultation and percussion with normal breathing effort HEART: regular rate & rhythm and no murmurs and no lower extremity edema ABDOMEN:abdomen  soft, non-tender and normal bowel sounds MUSCULOSKELETAL:no cyanosis of digits and no clubbing  NEURO: alert & oriented x 3 with fluent speech, no focal motor/sensory deficits EXTREMITIES: No lower extremity edema  LABORATORY DATA:  I have reviewed the data as listed CMP Latest Ref Rng & Units 09/26/2017 09/12/2017 09/05/2017  Glucose 70 - 140 mg/dL 84 87 107  BUN 7 - 26 mg/dL _0 Creatinine 0.60 - 1.10 mg/dL 0.77 0.71 0.72  Sodium 136 - 145 mmol/L 138 137 141  Potassium 3.5 - 5.1 mmol/L 3.2(L) 3.6 3.2(L)  Chloride 98 - 109 mmol/L 105 104 107  CO2 22 - 29 mmol/L _1 Calcium 8.4 - 10.4 mg/dL 9.4 9.4 9.2  Total Protein 6.4 - 8.3 g/dL 7.4 7.3 7.1  Total Bilirubin 0.2 - 1.2 mg/dL 0.5 0.4 0.5  Alkaline Phos 40 - 150 U/L 96 87 94  AST 5 - 34 U/L _2 ALT 0 - 55 U/L _3 Lab Results  Component Value Date   WBC 4.1 09/26/2017   HGB 12.5 09/26/2017   HCT 36.4 09/26/2017   MCV 90.6 09/26/2017   PLT 233 09/26/2017   NEUTROABS  2.3 09/26/2017    ASSESSMENT & PLAN:  Breast cancer of lower-outer quadrant of right female breast ([place]) Right breast biopsy 12/03/2014 8:00: Invasive ductal carcinoma, grade 3, ER 0%, PR 0%, 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 Neoadj chemo 12/24/14- 04/29/15 AC x 4 foll by Abraxane X 12 Rt Lumpectomy: Path CR 0/2 LN Adj XRT 07/23/15- 09/08/15  PET/CT scan 11/17/2016:Subcutaneous nodules in the neck, upper back, left arm, abdomen and pelvis  Patient progressed on Xeloda January 2019-07/04/2017 stopped due to progression of disease  Cerebellar mass diagnosed 07/12/2016: Resection followed by stereotactic radiation 07/29/2016  Current treatment:Halaven days 1 and 8 every 3 weeks today cycle 4 day 1 Chemo toxicities: Severe fatigue Denies any nausea vomiting. 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  Subcutaneous nodules decreased from 2.6 cm to 1.3 cm another nodule decreased from 42m to 9 mm.  Mesenteric nodule stable.  I discussed with the patient that this represents significant response to treatment.  Our plan is to continue with current treatment for 3 more cycles and then repeat another scan.  Return to clinic in3weeksfor toxicity check and for cycle 4.         No orders of the defined types were placed in this encounter.  The patient has a good understanding of the overall plan. she agrees with it. she will call with any problems that may develop before the next visit here.   VHarriette Ohara MD 09/26/17

## 2017-09-26 NOTE — Patient Instructions (Signed)
[facility] Discharge Instructions for Patients Receiving Chemotherapy  Today you received the following chemotherapy agents Halaven  To help prevent nausea and vomiting after your treatment, we encourage you to take your nausea medication as directed If you develop nausea and vomiting that is not controlled by your nausea medication, call the clinic.   BELOW ARE SYMPTOMS THAT SHOULD BE REPORTED IMMEDIATELY:  *FEVER GREATER THAN 100.5 F  *CHILLS WITH OR WITHOUT FEVER  NAUSEA AND VOMITING THAT IS NOT CONTROLLED WITH YOUR NAUSEA MEDICATION  *UNUSUAL SHORTNESS OF BREATH  *UNUSUAL BRUISING OR BLEEDING  TENDERNESS IN MOUTH AND THROAT WITH OR WITHOUT PRESENCE OF ULCERS  *URINARY PROBLEMS  *BOWEL PROBLEMS  UNUSUAL RASH Items with * indicate a potential emergency and should be followed up as soon as possible.  Feel free to call the clinic should you have any questions or concerns. The clinic phone number is [phone].  Please show the CHEMO ALERT CARD at check-in to the Emergency Department and triage nurse.   

## 2017-09-27 ENCOUNTER — Telehealth: Payer: Self-pay | Admitting: Hematology and Oncology

## 2017-09-27 NOTE — Telephone Encounter (Signed)
Spoke to patient regarding upcoming may and June appointments

## 2017-10-03 ENCOUNTER — Other Ambulatory Visit: Payer: BLUE CROSS/BLUE SHIELD

## 2017-10-03 ENCOUNTER — Inpatient Hospital Stay: Payer: BLUE CROSS/BLUE SHIELD

## 2017-10-03 VITALS — BP 130/92 | HR 72 | Temp 98.3°F | Resp 18

## 2017-10-03 DIAGNOSIS — Z171 Estrogen receptor negative status [ER-]: Principal | ICD-10-CM

## 2017-10-03 DIAGNOSIS — C50511 Malignant neoplasm of lower-outer quadrant of right female breast: Secondary | ICD-10-CM

## 2017-10-03 DIAGNOSIS — Z5111 Encounter for antineoplastic chemotherapy: Secondary | ICD-10-CM | POA: Diagnosis not present

## 2017-10-03 LAB — CBC WITH DIFFERENTIAL (CANCER CENTER ONLY)
Basophils Absolute: 0 10*3/uL (ref 0.0–0.1)
Basophils Relative: 1 %
EOS PCT: 0 %
Eosinophils Absolute: 0 10*3/uL (ref 0.0–0.5)
HCT: 35.4 % (ref 34.8–46.6)
Hemoglobin: 12 g/dL (ref 11.6–15.9)
LYMPHS ABS: 1.3 10*3/uL (ref 0.9–3.3)
LYMPHS PCT: 30 %
MCH: 30.9 pg (ref 25.1–34.0)
MCHC: 33.9 g/dL (ref 31.5–36.0)
MCV: 91.2 fL (ref 79.5–101.0)
Monocytes Absolute: 0.2 10*3/uL (ref 0.1–0.9)
Monocytes Relative: 5 %
Neutro Abs: 2.8 10*3/uL (ref 1.5–6.5)
Neutrophils Relative %: 64 %
PLATELETS: 222 10*3/uL (ref 145–400)
RBC: 3.89 MIL/uL (ref 3.70–5.45)
RDW: 14.7 % — ABNORMAL HIGH (ref 11.2–14.5)
WBC: 4.3 10*3/uL (ref 3.9–10.3)

## 2017-10-03 LAB — CMP (CANCER CENTER ONLY)
ALBUMIN: 3.9 g/dL (ref 3.5–5.0)
ALT: 12 U/L (ref 0–55)
AST: 18 U/L (ref 5–34)
Alkaline Phosphatase: 86 U/L (ref 40–150)
Anion gap: 7 (ref 3–11)
BUN: 11 mg/dL (ref 7–26)
CHLORIDE: 108 mmol/L (ref 98–109)
CO2: 26 mmol/L (ref 22–29)
Calcium: 9.2 mg/dL (ref 8.4–10.4)
Creatinine: 0.72 mg/dL (ref 0.60–1.10)
GFR, Est AFR Am: >60 mL/min (ref >60)
Glucose, Bld: 81 mg/dL (ref 70–140)
POTASSIUM: 3.5 mmol/L (ref 3.5–5.1)
SODIUM: 141 mmol/L (ref 136–145)
Total Bilirubin: 0.4 mg/dL (ref 0.2–1.2)
Total Protein: 7.3 g/dL (ref 6.4–8.3)

## 2017-10-03 MED ORDER — HEPARIN SOD (PORK) LOCK FLUSH 100 UNIT/ML IV SOLN
500.0000 [IU] | Freq: Once | INTRAVENOUS | Status: AC | PRN
  Administered 2017-10-03: 500 [IU]
  Filled 2017-10-03: qty 5

## 2017-10-03 MED ORDER — SODIUM CHLORIDE 0.9 % IV SOLN
2.0000 mg | Freq: Once | INTRAVENOUS | Status: AC
  Administered 2017-10-03: 2 mg via INTRAVENOUS
  Filled 2017-10-03: qty 4

## 2017-10-03 MED ORDER — ONDANSETRON HCL 4 MG/2ML IJ SOLN
INTRAMUSCULAR | Status: AC
  Filled 2017-10-03: qty 4

## 2017-10-03 MED ORDER — ONDANSETRON HCL 4 MG/2ML IJ SOLN
8.0000 mg | Freq: Once | INTRAMUSCULAR | Status: AC
  Administered 2017-10-03: 8 mg via INTRAVENOUS

## 2017-10-03 MED ORDER — SODIUM CHLORIDE 0.9 % IV SOLN
Freq: Once | INTRAVENOUS | Status: AC
  Administered 2017-10-03: 15:00:00 via INTRAVENOUS

## 2017-10-03 MED ORDER — SODIUM CHLORIDE 0.9% FLUSH
10.0000 mL | INTRAVENOUS | Status: DC | PRN
  Administered 2017-10-03: 10 mL
  Filled 2017-10-03: qty 10

## 2017-10-03 NOTE — Patient Instructions (Signed)
[facility] Discharge Instructions for Patients Receiving Chemotherapy  Today you received the following chemotherapy agents Halaven  To help prevent nausea and vomiting after your treatment, we encourage you to take your nausea medication as directed If you develop nausea and vomiting that is not controlled by your nausea medication, call the clinic.   BELOW ARE SYMPTOMS THAT SHOULD BE REPORTED IMMEDIATELY:  *FEVER GREATER THAN 100.5 F  *CHILLS WITH OR WITHOUT FEVER  NAUSEA AND VOMITING THAT IS NOT CONTROLLED WITH YOUR NAUSEA MEDICATION  *UNUSUAL SHORTNESS OF BREATH  *UNUSUAL BRUISING OR BLEEDING  TENDERNESS IN MOUTH AND THROAT WITH OR WITHOUT PRESENCE OF ULCERS  *URINARY PROBLEMS  *BOWEL PROBLEMS  UNUSUAL RASH Items with * indicate a potential emergency and should be followed up as soon as possible.  Feel free to call the clinic should you have any questions or concerns. The clinic phone number is [phone].  Please show the CHEMO ALERT CARD at check-in to the Emergency Department and triage nurse.   

## 2017-10-05 NOTE — Progress Notes (Signed)
FMLA paperwork has been successfully faxed to [employer] at [phone]. Copy has been mailed to patient address on file.

## 2017-10-19 ENCOUNTER — Inpatient Hospital Stay: Payer: BLUE CROSS/BLUE SHIELD

## 2017-10-19 ENCOUNTER — Inpatient Hospital Stay: Payer: BLUE CROSS/BLUE SHIELD | Admitting: Hematology and Oncology

## 2017-10-19 NOTE — Progress Notes (Deleted)
Patient Care Team: Gwendel Hanson as PCP - General (Cardiology) [name], [name], [name], [name], [name], [name], NP as Nurse Practitioner (Nurse Practitioner)  DIAGNOSIS:  Encounter Diagnosis  Name Primary?  . 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, estrogen receptor negative ([place])     SUMMARY OF ONCOLOGIC HISTORY:   Breast cancer of lower-outer quadrant of right female breast ([place])   12/03/2014 Mammogram    Right breast mass 1.9 cm it o'clock position 8 cm depth from the nipple      12/03/2014 Initial Diagnosis    Right breast biopsy 8:00: Invasive ductal carcinoma, grade 3, ER 0%, PR 0%, Ki-67 90%, HER-2 negative ratio 1.43      12/10/2014 Breast MRI    Right breast lower outer quadrant: 2.3 x 2.4 x 2.4 cm rim-enhancing mass abuts the pectoralis fascia but no enhancement of pectoralis muscle, second focus of artifact?'s second tissue marker clip, no lymph nodes      12/10/2014 Clinical Stage    Stage IIA: T2 N0      12/24/2014 - 04/29/2015 Neo-Adjuvant Chemotherapy    Dose dense Adriamycin and Cytoxan 4 followed by weekly Abraxane 12      05/02/2015 Breast MRI    complete radiologic response      06/16/2015 Surgery    Left Lumpectomy: Complete path Response, 0/2 LN      06/16/2015 Pathologic Stage    ypT0 ypN0      07/23/2015 - 09/05/2015 Radiation Therapy    Adjuvant RT: 50.4 Gy in 28 fractions and a boost of 10 Gy in 5 fractions to total dose of 60.4 Gy      10/24/2015 Survivorship    SCP mailed to patient in lieu of in person visit.      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  Today is cycle 5 of her treatment.  She appears to be tolerating it fairly well.  Does not have any nausea vomiting issues.  Neuropathy is well controlled.  REVIEW OF SYSTEMS:   Constitutional: Denies fevers, chills or abnormal weight loss Eyes: Denies blurriness of vision Ears, nose, mouth, throat, and face: Denies mucositis or sore throat Respiratory: Denies cough, dyspnea or wheezes Cardiovascular: Denies palpitation, chest discomfort Gastrointestinal:  Denies nausea, heartburn or change in bowel habits Skin: Denies abnormal skin rashes Lymphatics: Denies new lymphadenopathy or easy bruising Neurological: Mild neuropathy in the  hands Behavioral/Psych: Mood is stable, no new changes  Extremities: No lower extremity edema Breast:  denies any pain or lumps or nodules in either breasts All other systems were reviewed with the patient and are negative.  I have reviewed the past medical history, past surgical history, social history and family history with the patient and they are unchanged from previous note.  ALLERGIES:  has No Known Allergies.  MEDICATIONS:  Current Outpatient Medications  Medication Sig Dispense Refill  . ALPRAZolam (XANAX) 1 MG tablet Take 2 tablets (2 mg total) by mouth at bedtime. 60 tablet 0  . cyclobenzaprine (FLEXERIL) 5 MG tablet Take 1 tablet (5 mg total) by mouth 3 (three) times daily as needed for muscle spasms. (Patient not taking: Reported on 09/14/2017) 30 tablet 0  . gabapentin (NEURONTIN) 100 MG capsule Take 1 capsule (100 mg total) by mouth at bedtime. 30 capsule 6  . HYDROcodone-acetaminophen (NORCO/VICODIN) 5-325 MG tablet Take 1-2 tablets by mouth every 6 (six) hours as needed for moderate pain or severe pain. 15 tablet 0  . lidocaine-prilocaine (EMLA) cream APPLY TO AFFECTED AREA ONCE AS DIRECTED  3  . lisinopril-hydrochlorothiazide (PRINZIDE,ZESTORETIC) 20-12.5 MG tablet TAKE 1 TABLET BY MOUTH TWICE DAILY 180 tablet 0  . Wound Cleansers (RADIAPLEX EX) Apply topically.     No current facility-administered medications for this visit.     PHYSICAL EXAMINATION: ECOG PERFORMANCE STATUS: 1 - Symptomatic but completely ambulatory  There were no vitals filed for this visit. There were no vitals filed for this visit.  GENERAL:alert, no distress and comfortable SKIN: skin color, texture, turgor are normal, no rashes or significant lesions EYES: normal, Conjunctiva are pink and non-injected, sclera clear OROPHARYNX:no exudate, no erythema and lips, buccal mucosa, and tongue normal  NECK: supple, thyroid normal size, non-tender, without nodularity LYMPH:  no palpable lymphadenopathy  in the cervical, axillary or inguinal LUNGS: clear to auscultation and percussion with normal breathing effort HEART: regular rate & rhythm and no murmurs and no lower extremity edema ABDOMEN:abdomen soft, non-tender and normal bowel sounds MUSCULOSKELETAL:no cyanosis of digits and no clubbing  NEURO: alert & oriented x 3 with fluent speech, peripheral neuropathy EXTREMITIES: No lower extremity edema  LABORATORY DATA:  I have reviewed the data as listed CMP Latest Ref Rng & Units 10/03/2017 09/26/2017 09/12/2017  Glucose 70 - 140 mg/dL 81 84 87  BUN 7 - 26 mg/dL _0 Creatinine 0.60 - 1.10 mg/dL 0.72 0.77 0.71  Sodium 136 - 145 mmol/L 141 138 137  Potassium 3.5 - 5.1 mmol/L 3.5 3.2(L) 3.6  Chloride 98 - 109 mmol/L 108 105 104  CO2 22 - 29 mmol/L _1 Calcium 8.4 - 10.4 mg/dL 9.2 9.4 9.4  Total Protein 6.4 - 8.3 g/dL 7.3 7.4 7.3  Total Bilirubin 0.2 - 1.2 mg/dL 0.4 0.5 0.4  Alkaline Phos 40 - 150 U/L 86 96 87  AST 5 - 34 U/L _0 ALT 0 - 55 U/L _1 Lab Results  Component Value Date   WBC 4.3 10/03/2017   HGB 12.0 10/03/2017   HCT 35.4 10/03/2017   MCV 91.2 10/03/2017   PLT 222 10/03/2017   NEUTROABS 2.8 10/03/2017    ASSESSMENT & PLAN:  Breast cancer of lower-outer quadrant of right female breast ([place]) Right breast biopsy 12/03/2014 8:00: Invasive ductal carcinoma, grade 3, ER 0%, PR 0%, 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 Neoadj chemo 12/24/14- 04/29/15 AC x 4 foll by Abraxane X 12 Rt Lumpectomy: Path CR 0/2 LN Adj XRT 07/23/15- 09/08/15  PET/CT scan 11/17/2016:Subcutaneous nodules in the neck, upper back, left arm, abdomen and pelvis  Patient progressed on Xeloda January 2019-07/04/2017 stopped due to progression of disease  Cerebellar mass diagnosed 07/12/2016: Resection followed by stereotactic radiation 07/29/2016  Current  treatment:Halaven days 1 and 8 every 3 weeks today cycle5day 1 Chemo toxicities: Severe fatigue Denies any nausea vomiting. 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  Subcutaneous nodules decreased from 2.6 cm to 1.3 cm another nodule decreased from 34m to 9 mm.  Mesenteric nodule stable.  Our plan is to continue with current treatment for 6 cycles and then repeat another scan.  Return to clinic in3weeksfor toxicity check and for cycle6.         No orders of the defined types were placed in this encounter.  The patient has a good understanding of the overall plan. she agrees with it. she will call with any problems that may develop before the next visit here.   VHarriette Ohara MD 10/19/17

## 2017-10-19 NOTE — Assessment & Plan Note (Deleted)
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 Neoadj chemo 12/24/14- 04/29/15 AC x 4 foll by Abraxane X 12 Rt Lumpectomy: Path CR 0/2 LN Adj XRT 07/23/15- 09/08/15  PET/CT scan 11/17/2016:Subcutaneous nodules in the neck, upper back, left arm, abdomen and pelvis  Patient progressed on Xeloda January 2019-07/04/2017 stopped due to progression of disease  Cerebellar mass diagnosed 07/12/2016: Resection followed by stereotactic radiation 07/29/2016  Current treatment:Halaven days 1 and 8 every 3 weeks today cycle5day 1 Chemo toxicities: Severe fatigue Denies any nausea vomiting. 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  Subcutaneous nodules decreased from 2.6 cm to 1.3 cm another nodule decreased from 57m to 9 mm.  Mesenteric nodule stable.  Our plan is to continue with current treatment for 6 cycles and then repeat another scan.  Return to clinic in3weeksfor toxicity check and for cycle6.

## 2017-10-24 ENCOUNTER — Telehealth: Payer: Self-pay

## 2017-10-24 ENCOUNTER — Inpatient Hospital Stay: Payer: BLUE CROSS/BLUE SHIELD

## 2017-10-24 ENCOUNTER — Inpatient Hospital Stay: Payer: BLUE CROSS/BLUE SHIELD | Attending: Hematology and Oncology

## 2017-10-24 ENCOUNTER — Other Ambulatory Visit: Payer: Self-pay | Admitting: Hematology and Oncology

## 2017-10-24 DIAGNOSIS — Z171 Estrogen receptor negative status [ER-]: Secondary | ICD-10-CM | POA: Insufficient documentation

## 2017-10-24 DIAGNOSIS — C50511 Malignant neoplasm of lower-outer quadrant of right female breast: Secondary | ICD-10-CM | POA: Insufficient documentation

## 2017-10-24 DIAGNOSIS — C7931 Secondary malignant neoplasm of brain: Secondary | ICD-10-CM | POA: Insufficient documentation

## 2017-10-24 DIAGNOSIS — Z5111 Encounter for antineoplastic chemotherapy: Secondary | ICD-10-CM | POA: Insufficient documentation

## 2017-10-24 NOTE — Telephone Encounter (Signed)
Pt was scheduled for 215 labs and 315 infusion.  Pt has not shown up for either appt as of 335.  Attempted to call pt on number provided. Recording received stating mailbox full.  msg will be sent to schedulers to call pt to reschedule.

## 2017-10-25 ENCOUNTER — Telehealth: Payer: Self-pay | Admitting: *Deleted

## 2017-10-25 NOTE — Telephone Encounter (Signed)
Called pt to discuss missed chemo on 6/3. Unable to leave msg d/t mailbox full.

## 2017-10-25 NOTE — Telephone Encounter (Signed)
Sent email to verified email address. Pt is currently in [place] for a funeral, she will return on Thursday. Per Dr. Lindi Adie pt is to remain on current schedule and restart Haloven on 6/17. Gave this information to pt.

## 2017-11-07 ENCOUNTER — Inpatient Hospital Stay: Payer: BLUE CROSS/BLUE SHIELD

## 2017-11-07 ENCOUNTER — Encounter: Payer: Self-pay | Admitting: Adult Health

## 2017-11-07 ENCOUNTER — Inpatient Hospital Stay (HOSPITAL_BASED_OUTPATIENT_CLINIC_OR_DEPARTMENT_OTHER): Payer: BLUE CROSS/BLUE SHIELD | Admitting: Adult Health

## 2017-11-07 VITALS — BP 130/91 | HR 60 | Temp 98.5°F | Resp 18 | Ht 63.0 in | Wt 172.1 lb

## 2017-11-07 DIAGNOSIS — Z5111 Encounter for antineoplastic chemotherapy: Secondary | ICD-10-CM | POA: Diagnosis present

## 2017-11-07 DIAGNOSIS — Z171 Estrogen receptor negative status [ER-]: Secondary | ICD-10-CM | POA: Diagnosis not present

## 2017-11-07 DIAGNOSIS — C7931 Secondary malignant neoplasm of brain: Secondary | ICD-10-CM | POA: Diagnosis present

## 2017-11-07 DIAGNOSIS — C50511 Malignant neoplasm of lower-outer quadrant of right female breast: Secondary | ICD-10-CM

## 2017-11-07 DIAGNOSIS — Z7189 Other specified counseling: Secondary | ICD-10-CM

## 2017-11-07 DIAGNOSIS — C792 Secondary malignant neoplasm of skin: Secondary | ICD-10-CM

## 2017-11-07 LAB — CBC WITH DIFFERENTIAL (CANCER CENTER ONLY)
BASOS ABS: 0 10*3/uL (ref 0.0–0.1)
BASOS PCT: 0 %
EOS ABS: 0.1 10*3/uL (ref 0.0–0.5)
Eosinophils Relative: 1 %
HEMATOCRIT: 40 % (ref 34.8–46.6)
HEMOGLOBIN: 13.2 g/dL (ref 11.6–15.9)
Lymphocytes Relative: 28 %
Lymphs Abs: 1.4 10*3/uL (ref 0.9–3.3)
MCH: 30.3 pg (ref 25.1–34.0)
MCHC: 33 g/dL (ref 31.5–36.0)
MCV: 91.7 fL (ref 79.5–101.0)
MONOS PCT: 7 %
Monocytes Absolute: 0.3 10*3/uL (ref 0.1–0.9)
NEUTROS ABS: 3.3 10*3/uL (ref 1.5–6.5)
NEUTROS PCT: 64 %
Platelet Count: 218 10*3/uL (ref 145–400)
RBC: 4.36 MIL/uL (ref 3.70–5.45)
RDW: 13.5 % (ref 11.2–14.5)
WBC: 5.1 10*3/uL (ref 3.9–10.3)

## 2017-11-07 LAB — CMP (CANCER CENTER ONLY)
ALT: 6 U/L (ref 0–55)
ANION GAP: 7 (ref 3–11)
AST: 16 U/L (ref 5–34)
Albumin: 3.8 g/dL (ref 3.5–5.0)
Alkaline Phosphatase: 98 U/L (ref 40–150)
BUN: 13 mg/dL (ref 7–26)
CO2: 26 mmol/L (ref 22–29)
Calcium: 9.3 mg/dL (ref 8.4–10.4)
Chloride: 107 mmol/L (ref 98–109)
Creatinine: 0.76 mg/dL (ref 0.60–1.10)
Glucose, Bld: 81 mg/dL (ref 70–140)
Potassium: 3.5 mmol/L (ref 3.5–5.1)
SODIUM: 140 mmol/L (ref 136–145)
TOTAL PROTEIN: 7.4 g/dL (ref 6.4–8.3)
Total Bilirubin: 0.4 mg/dL (ref 0.2–1.2)

## 2017-11-07 MED ORDER — ONDANSETRON HCL 4 MG/2ML IJ SOLN
8.0000 mg | Freq: Once | INTRAMUSCULAR | Status: AC
  Administered 2017-11-07: 8 mg via INTRAVENOUS

## 2017-11-07 MED ORDER — SODIUM CHLORIDE 0.9 % IV SOLN
2.0000 mg | Freq: Once | INTRAVENOUS | Status: AC
  Administered 2017-11-07: 2 mg via INTRAVENOUS
  Filled 2017-11-07: qty 4

## 2017-11-07 MED ORDER — SODIUM CHLORIDE 0.9 % IV SOLN
Freq: Once | INTRAVENOUS | Status: AC
  Administered 2017-11-07: 16:00:00 via INTRAVENOUS

## 2017-11-07 MED ORDER — ONDANSETRON HCL 4 MG/2ML IJ SOLN
INTRAMUSCULAR | Status: AC
  Filled 2017-11-07: qty 4

## 2017-11-07 MED ORDER — SODIUM CHLORIDE 0.9% FLUSH
10.0000 mL | INTRAVENOUS | Status: DC | PRN
  Administered 2017-11-07: 10 mL
  Filled 2017-11-07: qty 10

## 2017-11-07 MED ORDER — HEPARIN SOD (PORK) LOCK FLUSH 100 UNIT/ML IV SOLN
500.0000 [IU] | Freq: Once | INTRAVENOUS | Status: AC | PRN
  Administered 2017-11-07: 500 [IU]
  Filled 2017-11-07: qty 5

## 2017-11-07 NOTE — Progress Notes (Signed)
[facility] Follow up:    [facility]  [address] [place] [address]   DIAGNOSIS: Cancer Staging Breast cancer of lower-outer quadrant of right female breast [facility]) Staging form: Breast, AJCC 7th Edition - Clinical stage from 12/11/2014: Stage IIA (T2, N0, M0) - Unsigned Staging comments: Staged at breast conference on 7.20.16 - Pathologic stage from 06/16/2015: yT0, N0, cM0 - Unsigned   SUMMARY OF ONCOLOGIC HISTORY:   Breast cancer of lower-outer quadrant of right female breast ([place])   12/03/2014 Mammogram    Right breast mass 1.9 cm it o'clock position 8 cm depth from the nipple      12/03/2014 Initial Diagnosis    Right breast biopsy 8:00: Invasive ductal carcinoma, grade 3, ER 0%, PR 0%, 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%, HER-2 negative ratio 1.43      12/10/2014 Breast MRI    Right breast lower outer quadrant: 2.3 x 2.4 x 2.4 cm rim-enhancing mass abuts the pectoralis fascia but no enhancement of pectoralis muscle, second focus of artifact?'s second tissue marker clip, no lymph nodes      12/10/2014 Clinical Stage    Stage IIA: T2 N0      12/24/2014 - 04/29/2015 Neo-Adjuvant Chemotherapy    Dose dense Adriamycin and Cytoxan 4 followed by weekly Abraxane 12      05/02/2015 Breast MRI    complete radiologic response      06/16/2015 Surgery    Left Lumpectomy: Complete path Response, 0/2 LN      06/16/2015 Pathologic Stage    ypT0 ypN0      07/23/2015 - 09/05/2015 Radiation Therapy    Adjuvant RT: 50.4 Gy in 28 fractions and a boost of 10 Gy in 5 fractions to total dose of 60.4 Gy      10/24/2015 Survivorship    SCP mailed to patient in lieu of in person visit.      07/26/2016 - 07/27/2016 Radiation Therapy     SRS brain      07/27/2016 - 07/29/2016 Hospital Admission    Cerebellar mass: Right suboccipital craniotomy for tumor resection with stereotactic navigation: Metastatic poorly differentiated adenocarcinoma with extensive  necrosis positive for CK 7, MOC 31, CK 5/6; Neg for Er/PR, GATA-3, GCDFP CDX2, Napsin A and TTF-1      11/17/2016 PET scan    Subcutaneous nodules in the neck, upper back, left arm, abdomen and pelvis,. 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 identified in BARD 1, BRCA2 and NF1      12/31/2016 Miscellaneous    Everolimus 10 mg daily for cycle 1 if she cannot tolerate will decrease to 7.5 mg daily      05/24/2017 - 07/04/2017 Chemotherapy    Xeloda 2000 mg 2 weeks on 1 week off stopped due to progression of disease based on CT scans done 06/27/2017      07/25/2017 -  Chemotherapy    Halaven days 1 and 8 every 3 weeks        CURRENT THERAPY: Eribulin cycle 5 day 1  INTERVAL HISTORY: Kaitlyn Keith 52 y.o. female returns for evaluation prior to receiving the Eribulin.  She tells me that she feels more anxious today because she  has been on a cruise and delayed her chemotherapy and has started to feel normal again.  She has noted a slight increase in discomfort at her left flank area and her right groin where her subcutaneous nodules are.  Kaitlyn Keith denies peripheral neuropathy.     Patient Active Problem List   Diagnosis Date Noted  . 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 Goals of care, counseling/discussion 07/04/2017  . Genetic testing 12/10/2016  . 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 Cerebellar mass 07/27/2016  . Cerebellar tumor ([place]) 07/27/2016  . 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 C2 cervical fracture ([place]) 07/12/2016  . Hypertension 07/12/2016  . Degenerative spinal arthritis 11/18/2015  . Chemotherapy-induced  peripheral neuropathy ([place]) 06/23/2015  . Paronychia of great toe, left 02/04/2015  . Breast cancer of lower-outer quadrant of right female breast ([place]) 12/05/2014    has No Known Allergies.  MEDICAL HISTORY: Past Medical History:  Diagnosis Date  . Anxiety   . Arthritis   . Back pain   . Brain cancer ([place])    brian met from triple negative breast ca  . Breast cancer ([place])   . Breast cancer of lower-outer quadrant of right female breast ([place]) 12/05/2014  . Depression   . FH: chemotherapy 12/2014-04/2015  . Genetic testing 12/10/2016   Kaitlyn Keith underwent genetic counseling and testing for hereditary cancer syndromes on 11/18/2016. 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 Mutations in MSH6 are associated with a hereditary cancer syndrome called Lynch syndrome. For more detailed discussion, please see genetic counseling documentation from 12/10/2016.  Testing was perfo  . Headache    due to brain cancer, no longer having them  . Hot flashes   . Hypertension   . 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 Mutations in MSH6 are associated with a hereditary cancer syndrome called Lynch syndrome. For more detailed discussion, please see genetic counseling documentation from 12/10/2016.  Testing was perfo  . 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 Radiation 07/23/15-09/05/15   right breast 50.4 Gy, boost of 10 Gy    SURGICAL HISTORY: Past Surgical History:  Procedure Laterality Date  . APPLICATION OF CRANIAL NAVIGATION Right 07/27/2016   Procedure: APPLICATION OF CRANIAL NAVIGATION;  Surgeon: Kevan Ny Ditty, MD;  Location: [facility] [place];  Service: Neurosurgery;  Laterality: Right;  . BIOPSY OF SKIN  SUBCUTANEOUS TISSUE AND/OR MUCOUS MEMBRANE Left 12/24/2016   Procedure: OPEN BIOPSY LESIONS ON LEFT LOWER BACK AND SHOULDER BLADE;  Surgeon: [name], MD;  Location: [facility];  Service: General;  Laterality: Left;  . BREAST LUMPECTOMY WITH NEEDLE LOCALIZATION AND AXILLARY SENTINEL LYMPH NODE BX Right 06/16/2015   Procedure: BREAST LUMPECTOMY WITH NEEDLE LOCALIZATION AND AXILLARY SENTINEL LYMPH NODE BX;  Surgeon: [name], MD;  Location: [place];  Service: General;  Laterality: Right;  . BREAST REDUCTION SURGERY    . CRANIOTOMY Right 07/27/2016   Procedure: Right Suboccipital craniotomy for tumor resection with stereotactic navigation;  Surgeon: Kevan Ny Ditty, MD;  Location: [facility];  Service: Neurosurgery;  Laterality: Right;  . PORT-A-CATH REMOVAL N/A 06/16/2015   Procedure: REMOVAL PORT-A-CATH;  Surgeon: [name], MD;  Location: [facility];  Service: General;  Laterality: N/A;  .  PORTACATH PLACEMENT N/A 12/23/2014   Procedure: INSERTION PORT-A-CATH;  Surgeon: [name], MD;  Location: [place];  Service: General;  Laterality: N/A;  . PORTACATH PLACEMENT Left 07/08/2017   Procedure: INSERTION PORT-A-CATH;  Surgeon: [name], MD;  Location: [facility];  Service: General;  Laterality: Left;    SOCIAL HISTORY: Social History   Socioeconomic History  . Marital status: Divorced    Spouse name: Not on file  . Number of children: 1  . Years of education: Not on file  . Highest education level: Not on file  Occupational History  . Not on file  Social Needs  . Financial resource strain: Not on file  . Food insecurity:    Worry: Not on file    Inability: Not on file  . Transportation needs:    Medical: Not on file    Non-medical: Not on file  Tobacco Use  . Smoking status: Never Smoker  . Smokeless tobacco: Never Used  Substance and Sexual Activity  . Alcohol use: Yes    Comment: social  . Drug use: No   . Sexual activity: Yes  Lifestyle  . Physical activity:    Days per week: Not on file    Minutes per session: Not on file  . Stress: Not on file  Relationships  . Social connections:    Talks on phone: Not on file    Gets together: Not on file    Attends religious service: Not on file    Active member of club or organization: Not on file    Attends meetings of clubs or organizations: Not on file    Relationship status: Not on file  . Intimate partner violence:    Fear of current or ex partner: Not on file    Emotionally abused: Not on file    Physically abused: Not on file    Forced sexual activity: Not on file  Other Topics Concern  . Not on file  Social History Narrative  . Not on file    FAMILY HISTORY: Family History  Problem Relation Age of Onset  . Aneurysm Mother 20       d.55  . Heart attack Father 61       d.62  . Endometrial cancer Sister 47  . Lung cancer Maternal Uncle        d.68s  . Cancer Maternal Grandmother        unspecified type-possibly stomach d.88s    Review of Systems  Constitutional: Negative for appetite change, chills, fatigue, fever and unexpected weight change.  HENT:   Negative for hearing loss, lump/mass and trouble swallowing.   Eyes: Negative for eye problems and icterus.  Respiratory: Negative for chest tightness, cough and shortness of breath.   Cardiovascular: Negative for chest pain, leg swelling and palpitations.  Gastrointestinal: Negative for abdominal distention, abdominal pain, constipation, diarrhea, nausea and vomiting.  Endocrine: Negative for hot flashes.  Skin: Negative for itching and rash.  Neurological: Negative for dizziness and numbness.  Hematological: Negative for adenopathy. Does not bruise/bleed easily.  Psychiatric/Behavioral: Negative for depression. The patient is not nervous/anxious.       PHYSICAL EXAMINATION  ECOG PERFORMANCE STATUS: 1 - Symptomatic but completely ambulatory  Vitals:   11/07/17  1500  BP: (!) 130/91  Pulse: 60  Resp: 18  Temp: 98.5 F (36.9 C)  SpO2: 100%    Physical Exam  Constitutional: She is oriented to person, place, and time. She  appears well-developed and well-nourished.  HENT:  Head: Normocephalic and atraumatic.  Mouth/Throat: Oropharynx is clear and moist. No oropharyngeal exudate.  Eyes: Pupils are equal, round, and reactive to light. No scleral icterus.  Neck: Neck supple.  Cardiovascular: Normal rate, regular rhythm and normal heart sounds.  Pulmonary/Chest: Effort normal and breath sounds normal.  Abdominal: Soft. Bowel sounds are normal. She exhibits no distension and no mass. There is no tenderness. There is no rebound and no guarding.  Lymphadenopathy:    She has no cervical adenopathy.  Neurological: She is alert and oriented to person, place, and time.  Skin: Skin is warm and dry. Capillary refill takes less than 2 seconds.  Psychiatric: She has a normal mood and affect.    LABORATORY DATA:  CBC    Component Value Date/Time   WBC 5.1 11/07/2017 1428   WBC 4.4 04/04/2017 1416   WBC 4.9 12/24/2016 0634   RBC 4.36 11/07/2017 1428   HGB 13.2 11/07/2017 1428   HGB 11.7 04/04/2017 1416   HCT 40.0 11/07/2017 1428   HCT 35.9 04/04/2017 1416   PLT 218 11/07/2017 1428   PLT 207 04/04/2017 1416   MCV 91.7 11/07/2017 1428   MCV 78.4 (L) 04/04/2017 1416   MCH 30.3 11/07/2017 1428   MCHC 33.0 11/07/2017 1428   RDW 13.5 11/07/2017 1428   RDW 12.8 04/04/2017 1416   LYMPHSABS 1.4 11/07/2017 1428   LYMPHSABS 1.2 04/04/2017 1416   MONOABS 0.3 11/07/2017 1428   MONOABS 0.4 04/04/2017 1416   EOSABS 0.1 11/07/2017 1428   EOSABS 0.0 04/04/2017 1416   BASOSABS 0.0 11/07/2017 1428   BASOSABS 0.0 04/04/2017 1416    CMP     Component Value Date/Time   NA 141 10/03/2017 1448   NA 139 04/04/2017 1416   K 3.5 10/03/2017 1448   K 3.7 04/04/2017 1416   CL 108 10/03/2017 1448   CO2 26 10/03/2017 1448   CO2 24 04/04/2017 1416   GLUCOSE 81  10/03/2017 1448   GLUCOSE 76 04/04/2017 1416   BUN 11 10/03/2017 1448   BUN 5.8 (L) 04/04/2017 1416   CREATININE 0.72 10/03/2017 1448   CREATININE 0.8 04/04/2017 1416   CALCIUM 9.2 10/03/2017 1448   CALCIUM 8.6 04/04/2017 1416   PROT 7.3 10/03/2017 1448   PROT 7.0 04/04/2017 1416   ALBUMIN 3.9 10/03/2017 1448   ALBUMIN 3.0 (L) 04/04/2017 1416   AST 18 10/03/2017 1448   AST 18 04/04/2017 1416   ALT 12 10/03/2017 1448   ALT 12 04/04/2017 1416   ALKPHOS 86 10/03/2017 1448   ALKPHOS 131 04/04/2017 1416   BILITOT 0.4 10/03/2017 1448   BILITOT 0.43 04/04/2017 1416   GFRNONAA >60 10/03/2017 1448   GFRAA >60 10/03/2017 1448     ASSESSMENT and PLAN:   Breast cancer of lower-outer quadrant of right female breast ([place]) Right breast biopsy 12/03/2014 8:00: Invasive ductal carcinoma, grade 3, ER 0%, PR 0%, 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 Neoadj chemo 12/24/14- 04/29/15 AC x 4 foll by Abraxane X 12 Rt Lumpectomy: Path CR 0/2 LN Adj XRT 07/23/15- 09/08/15  PET/CT scan 11/17/2016:Subcutaneous nodules in the neck, upper back, left arm, abdomen and pelvis  Patient progressed on Xeloda January 2019-07/04/2017 stopped due to progression of disease  Cerebellar mass diagnosed 07/12/2016: Resection followed by stereotactic radiation 07/29/2016  Current  treatment:Halaven days 1 and 8 every 3 weeks today cycle 5 day 1  Kaitlyn Keith is tolerating treatment well.  She will proceed with treatment today with Eribulin (CBC normal, and so long as CMET within parameters).  I reviewed that she will need scans again after cycle 6.  She will let me know if the discomfort in her left flank area, right groin continue.  Goals of care were discussed today.    Return to clinic in3weeksfor toxicity check and for cycle 6.       All questions were answered. The patient knows to call the  clinic with any problems, questions or concerns. We can certainly see the patient much sooner if necessary.  A total of (20) minutes of face-to-face time was spent with this patient with greater than 50% of that time in counseling and care-coordination.  This note was electronically signed. Scot Dock, NP 11/07/2017

## 2017-11-07 NOTE — Patient Instructions (Signed)
[facility] Discharge Instructions for Patients Receiving Chemotherapy  Today you received the following chemotherapy agents Halaven  To help prevent nausea and vomiting after your treatment, we encourage you to take your nausea medication as directed If you develop nausea and vomiting that is not controlled by your nausea medication, call the clinic.   BELOW ARE SYMPTOMS THAT SHOULD BE REPORTED IMMEDIATELY:  *FEVER GREATER THAN 100.5 F  *CHILLS WITH OR WITHOUT FEVER  NAUSEA AND VOMITING THAT IS NOT CONTROLLED WITH YOUR NAUSEA MEDICATION  *UNUSUAL SHORTNESS OF BREATH  *UNUSUAL BRUISING OR BLEEDING  TENDERNESS IN MOUTH AND THROAT WITH OR WITHOUT PRESENCE OF ULCERS  *URINARY PROBLEMS  *BOWEL PROBLEMS  UNUSUAL RASH Items with * indicate a potential emergency and should be followed up as soon as possible.  Feel free to call the clinic should you have any questions or concerns. The clinic phone number is [phone].  Please show the CHEMO ALERT CARD at check-in to the Emergency Department and triage nurse.   

## 2017-11-07 NOTE — Assessment & Plan Note (Signed)
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 Neoadj chemo 12/24/14- 04/29/15 AC x 4 foll by Abraxane X 12 Rt Lumpectomy: Path CR 0/2 LN Adj XRT 07/23/15- 09/08/15  PET/CT scan 11/17/2016:Subcutaneous nodules in the neck, upper back, left arm, abdomen and pelvis  Patient progressed on Xeloda January 2019-07/04/2017 stopped due to progression of disease  Cerebellar mass diagnosed 07/12/2016: Resection followed by stereotactic radiation 07/29/2016  Current treatment:Halaven days 1 and 8 every 3 weeks today cycle 5 day 1  Kaitlyn Keith is tolerating treatment well.  She will proceed with treatment today with Eribulin (CBC normal, and so long as CMET within parameters).  I reviewed that she will need scans again after cycle 6.  She will let me know if the discomfort in her left flank area, right groin continue.  Goals of care were discussed today.    Return to clinic in3weeksfor toxicity check and for cycle 6.

## 2017-11-10 ENCOUNTER — Other Ambulatory Visit: Payer: Self-pay | Admitting: *Deleted

## 2017-11-10 DIAGNOSIS — C50511 Malignant neoplasm of lower-outer quadrant of right female breast: Secondary | ICD-10-CM

## 2017-11-10 MED ORDER — ALPRAZOLAM 1 MG PO TABS: 2.0000 mg | ORAL_TABLET | Freq: Every day | ORAL | 1 refills | Status: DC

## 2017-11-14 ENCOUNTER — Inpatient Hospital Stay: Payer: BLUE CROSS/BLUE SHIELD

## 2017-11-14 VITALS — BP 137/92 | HR 67 | Temp 97.9°F | Resp 18 | Wt 173.0 lb

## 2017-11-14 DIAGNOSIS — Z171 Estrogen receptor negative status [ER-]: Principal | ICD-10-CM

## 2017-11-14 DIAGNOSIS — C792 Secondary malignant neoplasm of skin: Secondary | ICD-10-CM

## 2017-11-14 DIAGNOSIS — Z5111 Encounter for antineoplastic chemotherapy: Secondary | ICD-10-CM | POA: Diagnosis not present

## 2017-11-14 DIAGNOSIS — C50511 Malignant neoplasm of lower-outer quadrant of right female breast: Secondary | ICD-10-CM

## 2017-11-14 LAB — CBC WITH DIFFERENTIAL (CANCER CENTER ONLY)
BASOS ABS: 0 10*3/uL (ref 0.0–0.1)
Basophils Relative: 1 %
Eosinophils Absolute: 0 10*3/uL (ref 0.0–0.5)
Eosinophils Relative: 1 %
HEMATOCRIT: 38.1 % (ref 34.8–46.6)
HEMOGLOBIN: 12.8 g/dL (ref 11.6–15.9)
LYMPHS PCT: 33 %
Lymphs Abs: 1.4 10*3/uL (ref 0.9–3.3)
MCH: 29.8 pg (ref 25.1–34.0)
MCHC: 33.7 g/dL (ref 31.5–36.0)
MCV: 88.5 fL (ref 79.5–101.0)
Monocytes Absolute: 0.2 10*3/uL (ref 0.1–0.9)
Monocytes Relative: 4 %
NEUTROS ABS: 2.6 10*3/uL (ref 1.5–6.5)
NEUTROS PCT: 61 %
PLATELETS: 210 10*3/uL (ref 145–400)
RBC: 4.31 MIL/uL (ref 3.70–5.45)
RDW: 14 % (ref 11.2–14.5)
WBC: 4.1 10*3/uL (ref 3.9–10.3)

## 2017-11-14 LAB — CMP (CANCER CENTER ONLY)
ALT: 8 U/L (ref 0–55)
AST: 18 U/L (ref 5–34)
Albumin: 3.8 g/dL (ref 3.5–5.0)
Alkaline Phosphatase: 86 U/L (ref 40–150)
Anion gap: 7 (ref 3–11)
BILIRUBIN TOTAL: 0.4 mg/dL (ref 0.2–1.2)
BUN: 10 mg/dL (ref 7–26)
CHLORIDE: 106 mmol/L (ref 98–109)
CO2: 26 mmol/L (ref 22–29)
CREATININE: 0.67 mg/dL (ref 0.60–1.10)
Calcium: 9.3 mg/dL (ref 8.4–10.4)
GFR, Est AFR Am: >60 mL/min (ref >60)
Glucose, Bld: 81 mg/dL (ref 70–140)
Potassium: 3.3 mmol/L — ABNORMAL LOW (ref 3.5–5.1)
Sodium: 139 mmol/L (ref 136–145)
Total Protein: 7.3 g/dL (ref 6.4–8.3)

## 2017-11-14 MED ORDER — SODIUM CHLORIDE 0.9% FLUSH
10.0000 mL | INTRAVENOUS | Status: DC | PRN
  Administered 2017-11-14: 10 mL
  Filled 2017-11-14: qty 10

## 2017-11-14 MED ORDER — SODIUM CHLORIDE 0.9 % IV SOLN
2.0000 mg | Freq: Once | INTRAVENOUS | Status: AC
  Administered 2017-11-14: 2 mg via INTRAVENOUS
  Filled 2017-11-14: qty 4

## 2017-11-14 MED ORDER — ONDANSETRON HCL 4 MG/2ML IJ SOLN
INTRAMUSCULAR | Status: AC
  Filled 2017-11-14: qty 4

## 2017-11-14 MED ORDER — HEPARIN SOD (PORK) LOCK FLUSH 100 UNIT/ML IV SOLN
500.0000 [IU] | Freq: Once | INTRAVENOUS | Status: AC | PRN
  Administered 2017-11-14: 500 [IU]
  Filled 2017-11-14: qty 5

## 2017-11-14 MED ORDER — ONDANSETRON HCL 4 MG/2ML IJ SOLN
8.0000 mg | Freq: Once | INTRAMUSCULAR | Status: AC
  Administered 2017-11-14: 8 mg via INTRAVENOUS

## 2017-11-14 MED ORDER — SODIUM CHLORIDE 0.9 % IV SOLN
Freq: Once | INTRAVENOUS | Status: AC
  Administered 2017-11-14: 16:00:00 via INTRAVENOUS

## 2017-11-14 NOTE — Patient Instructions (Signed)
[facility] Discharge Instructions for Patients Receiving Chemotherapy  Today you received the following chemotherapy agents Halaven  To help prevent nausea and vomiting after your treatment, we encourage you to take your nausea medication as directed If you develop nausea and vomiting that is not controlled by your nausea medication, call the clinic.   BELOW ARE SYMPTOMS THAT SHOULD BE REPORTED IMMEDIATELY:  *FEVER GREATER THAN 100.5 F  *CHILLS WITH OR WITHOUT FEVER  NAUSEA AND VOMITING THAT IS NOT CONTROLLED WITH YOUR NAUSEA MEDICATION  *UNUSUAL SHORTNESS OF BREATH  *UNUSUAL BRUISING OR BLEEDING  TENDERNESS IN MOUTH AND THROAT WITH OR WITHOUT PRESENCE OF ULCERS  *URINARY PROBLEMS  *BOWEL PROBLEMS  UNUSUAL RASH Items with * indicate a potential emergency and should be followed up as soon as possible.  Feel free to call the clinic should you have any questions or concerns. The clinic phone number is [phone].  Please show the CHEMO ALERT CARD at check-in to the Emergency Department and triage nurse.   

## 2017-11-25 ENCOUNTER — Other Ambulatory Visit: Payer: Self-pay | Admitting: Hematology and Oncology

## 2017-11-25 DIAGNOSIS — C50511 Malignant neoplasm of lower-outer quadrant of right female breast: Secondary | ICD-10-CM

## 2017-11-27 IMAGING — MR MR HEAD WO/W CM
9 of 11 series · 25 of 48 positions shown · IV contrast (multihance)
Comparison: Prior MRI from 07/17/2016.

CLINICAL DATA: Follow-up exam status post suboccipital craniotomy
for tumor resection.

EXAM:
MRI HEAD WITHOUT AND WITH CONTRAST
TECHNIQUE: Multiplanar, multiecho pulse sequences of the brain and surrounding
structures were obtained without and with intravenous contrast.
CONTRAST:  17mL MULTIHANCE GADOBENATE DIMEGLUMINE 529 MG/ML IV SOLN

[Series 2: FLAIR · sagittal · 3.0mm · 0.47mm/px · 3 of 47 slices shown (1 of 3)]
[im 1/47]
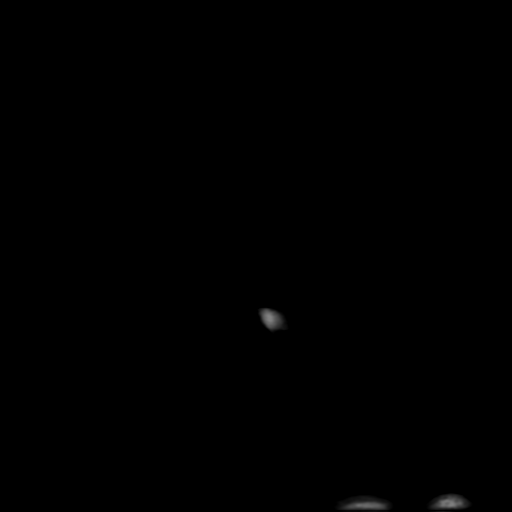
[im 24/47]
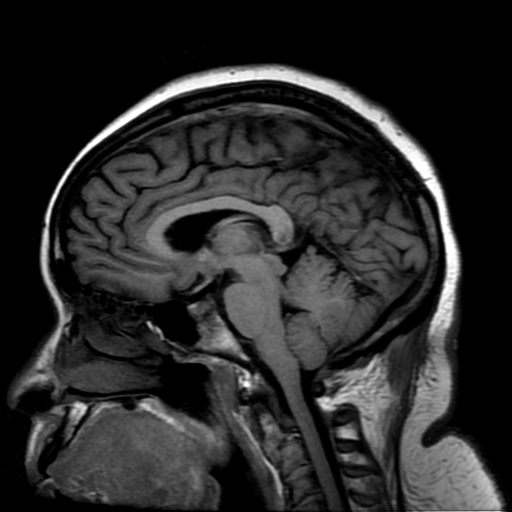
[im 47/47]
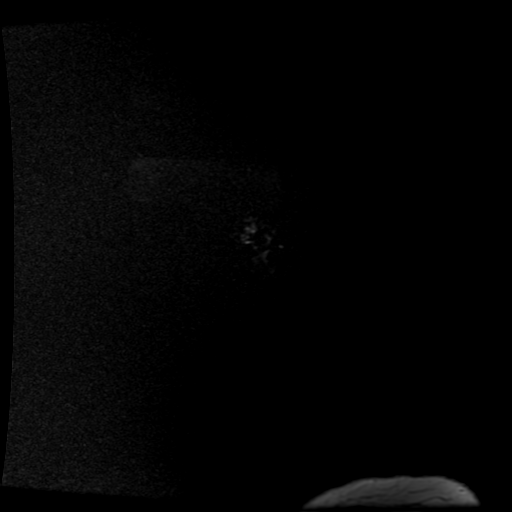

[Series 4: DWI · axial · 3.0mm · 0.94mm/px · z∈[-41,+106]mm · 7 of 100 slices shown]
[im 1/100]
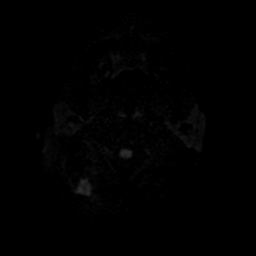
[im 17/100]
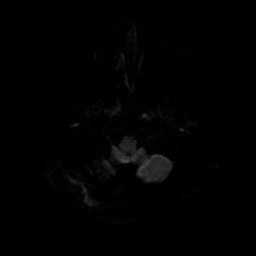
[im 34/100]
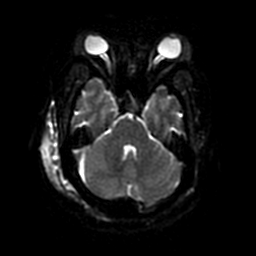
[im 50/100]
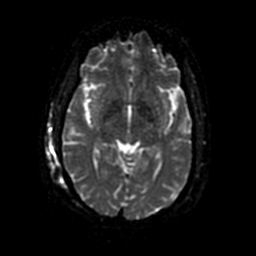
[im 67/100]
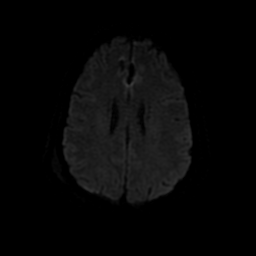
[im 83/100]
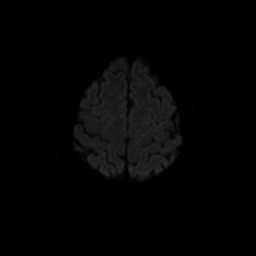
[im 100/100]
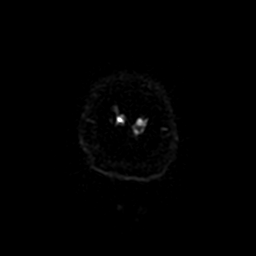

[Series 5: T2 · axial · 5.0mm · 0.47mm/px · z∈[-45,+105]mm · 2 of 26 slices shown]
[im 1/26]
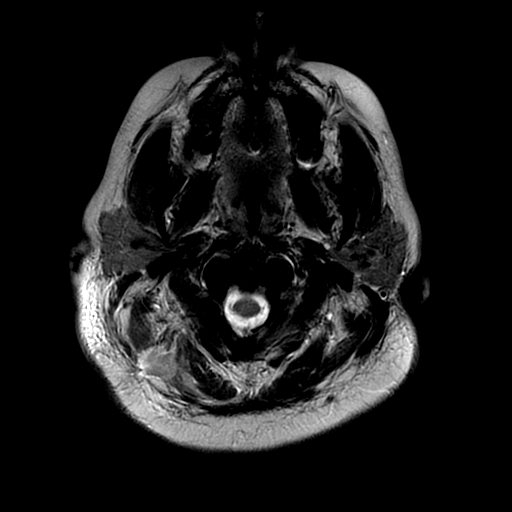
[im 26/26]
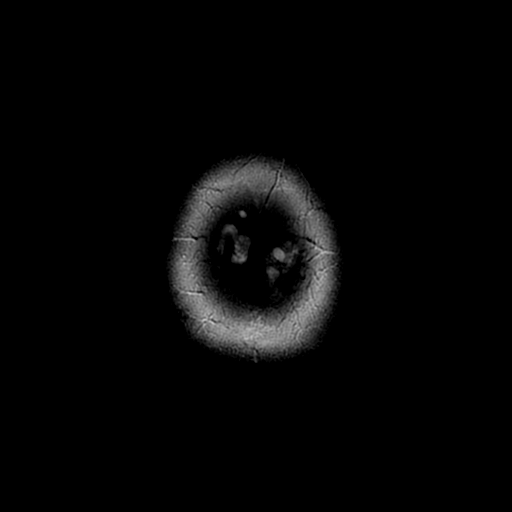

[Series 6: FLAIR · axial · 4.0mm · 0.41mm/px · z∈[-42,+123]mm · 2 of 31 slices shown (2 of 3)]
[im 1/31]
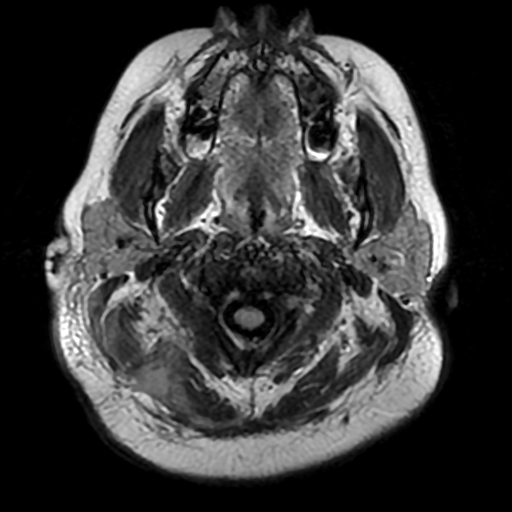
[im 31/31]
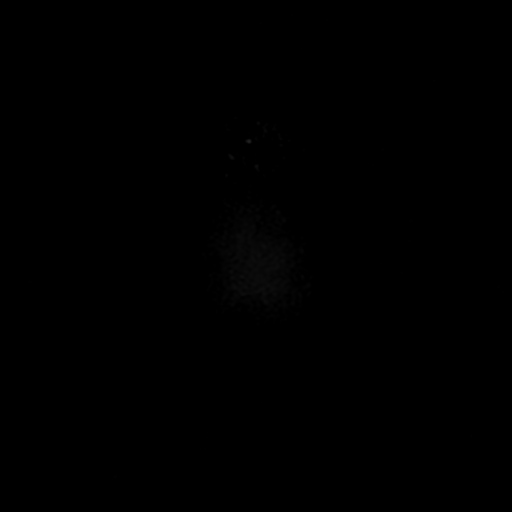

[Series 7: GRE · axial · 5.0mm · 0.47mm/px · z∈[-45,+105]mm · 2 of 26 slices shown]
[im 1/26]
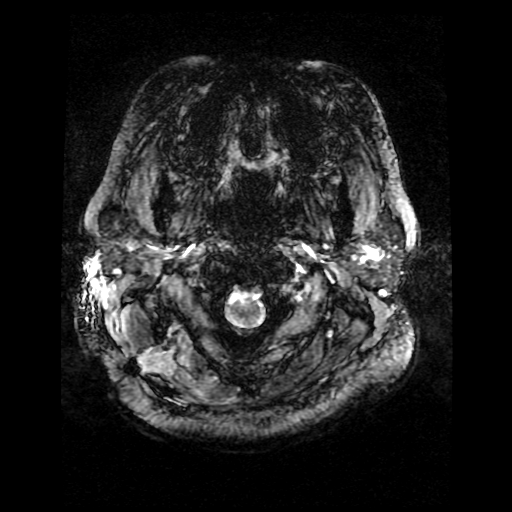
[im 26/26]
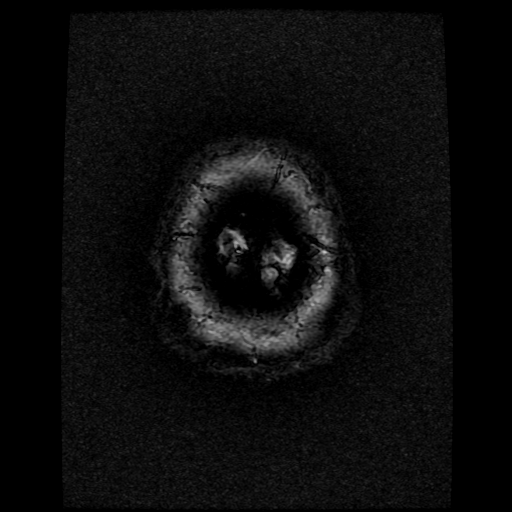

[Series 9: T2 post-contrast · coronal · 5.0mm · 0.39mm/px · 2 of 30 slices shown]
[im 1/30]
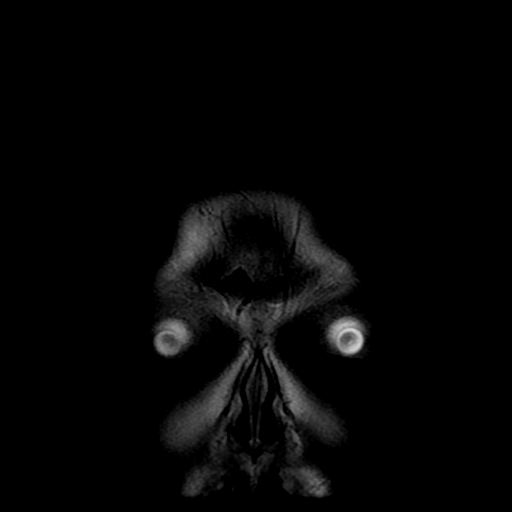
[im 30/30]
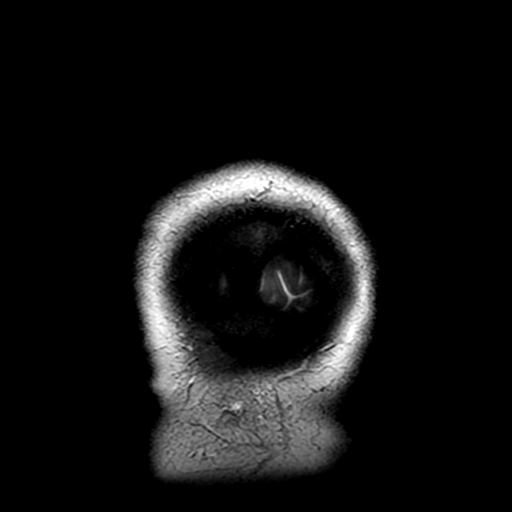

[Series 11: T1 · coronal · 5.0mm · 0.39mm/px · 1 of 30 slices shown]
[im 1/30]
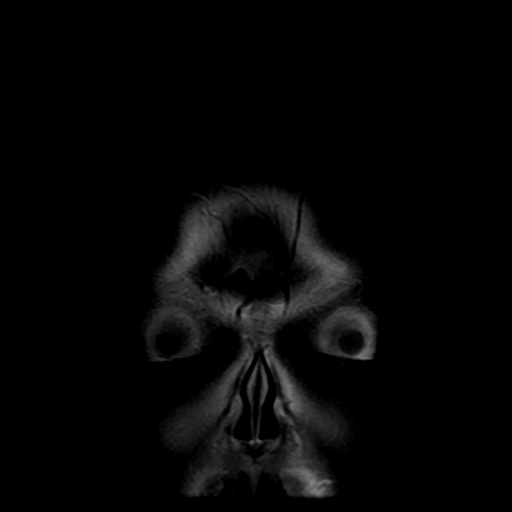

[Series 12: FLAIR · sagittal · 3.0mm · 0.47mm/px · 3 of 47 slices shown (3 of 3)]
[im 1/47]
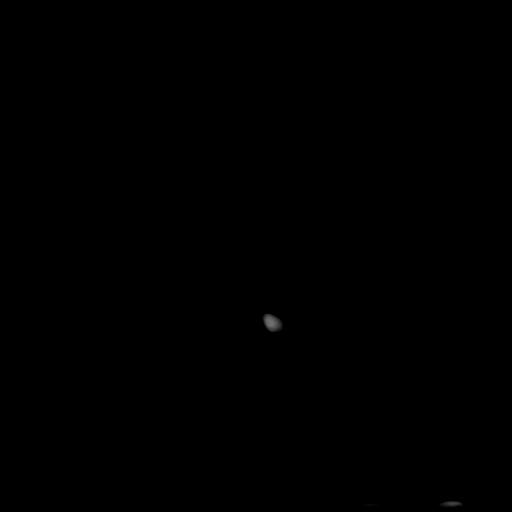
[im 24/47]
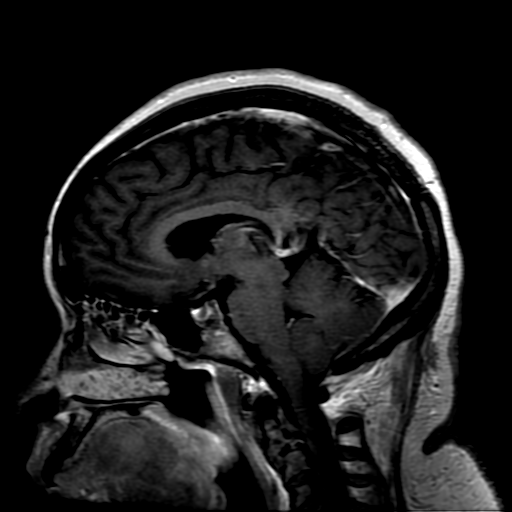
[im 47/47]
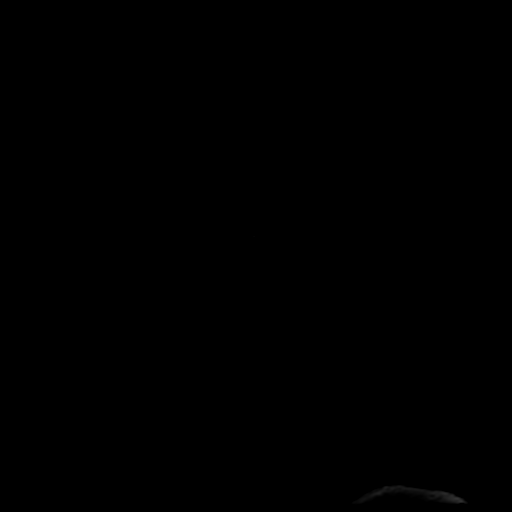

[Series 450: ADC · axial · 3.0mm · 0.94mm/px · z∈[-41,+106]mm · 3 of 49 slices shown]
[im 1/49]
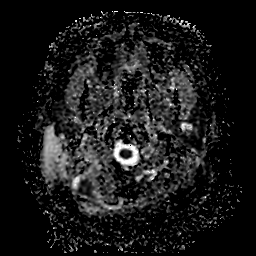
[im 25/49]
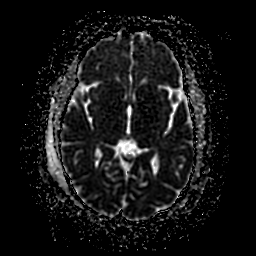
[im 49/49]
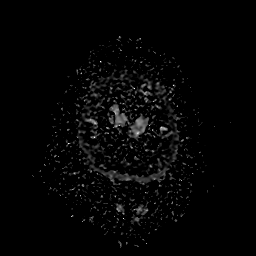

[25 of 48 positions shown; findings below may reference images not displayed]

FINDINGS: Brain: Postoperative changes from interval right suboccipital
craniotomy for solitary brain metastasis seen. Postoperative changes
present within the peripheral right cerebellar hemisphere, with
small amount of postoperative blood products. Previously seen tumor
has been resected, with no appreciable residual tumor now
identified. Fairly faint linear enhancement about the resection
cavity felt to be most consistent with postoperative changes. Small
amount of persistent edema within the right cerebellum, decreased
from previous. No complication identified. Postoperative collection
within the right suboccipital soft tissues measures 31 x 20 mm.

Stable cerebral volume. Minimal chronic microvascular ischemic
changes again noted. No evidence for acute or subacute ischemia. No
other mass lesion or abnormal enhancement. No hydrocephalus. No
extra-axial fluid collection. Major dural sinuses are patent.
Scattered foci susceptibility artifact along the anterior falx most
consistent with postoperative gas/pneumocephalus from recent
surgery.

Vascular: Major intracranial vascular flow voids are well
maintained.

Skull and upper cervical spine: Craniocervical junction normal.
Grade 1 anterolisthesis of C2 on C3 noted, stable. Postoperative
swelling and edema with emphysema within the right suboccipital soft
tissues.

Sinuses/Orbits: Globes and orbital soft tissues within normal
limits. Paranasal sinuses are clear. No mastoid effusion. Inner ear
structures normal.

Other: No other significant finding.
IMPRESSION: 1. Postoperative changes from interval right suboccipital craniotomy
for resection of solitary brain metastasis. No complication
identified. No residual tumor identified. Mild persistent but
improved edema within the right cerebellar hemisphere.
2. No other acute intracranial process.

## 2017-11-28 ENCOUNTER — Inpatient Hospital Stay: Payer: BLUE CROSS/BLUE SHIELD | Attending: Hematology and Oncology

## 2017-11-28 ENCOUNTER — Inpatient Hospital Stay: Payer: BLUE CROSS/BLUE SHIELD

## 2017-11-28 ENCOUNTER — Inpatient Hospital Stay (HOSPITAL_BASED_OUTPATIENT_CLINIC_OR_DEPARTMENT_OTHER): Payer: BLUE CROSS/BLUE SHIELD

## 2017-11-28 ENCOUNTER — Other Ambulatory Visit: Payer: BLUE CROSS/BLUE SHIELD

## 2017-11-28 ENCOUNTER — Inpatient Hospital Stay: Payer: BLUE CROSS/BLUE SHIELD | Admitting: Hematology and Oncology

## 2017-11-28 VITALS — BP 125/95 | HR 69 | Temp 97.9°F | Resp 17 | Ht 63.0 in | Wt 176.0 lb

## 2017-11-28 DIAGNOSIS — Z5111 Encounter for antineoplastic chemotherapy: Secondary | ICD-10-CM | POA: Diagnosis present

## 2017-11-28 DIAGNOSIS — Z171 Estrogen receptor negative status [ER-]: Principal | ICD-10-CM

## 2017-11-28 DIAGNOSIS — C50511 Malignant neoplasm of lower-outer quadrant of right female breast: Secondary | ICD-10-CM | POA: Diagnosis present

## 2017-11-28 DIAGNOSIS — C7931 Secondary malignant neoplasm of brain: Secondary | ICD-10-CM | POA: Insufficient documentation

## 2017-11-28 DIAGNOSIS — G620 Drug-induced polyneuropathy: Secondary | ICD-10-CM | POA: Insufficient documentation

## 2017-11-28 DIAGNOSIS — C792 Secondary malignant neoplasm of skin: Secondary | ICD-10-CM

## 2017-11-28 LAB — CMP (CANCER CENTER ONLY)
ALBUMIN: 3.9 g/dL (ref 3.5–5.0)
ALT: 14 U/L (ref 0–44)
AST: 18 U/L (ref 15–41)
Alkaline Phosphatase: 102 U/L (ref 38–126)
Anion gap: 6 (ref 5–15)
BUN: 10 mg/dL (ref 6–20)
CHLORIDE: 104 mmol/L (ref 98–111)
CO2: 30 mmol/L (ref 22–32)
Calcium: 9.9 mg/dL (ref 8.9–10.3)
Creatinine: 0.7 mg/dL (ref 0.44–1.00)
GFR, Est AFR Am: >60 mL/min (ref >60)
GFR, Estimated: >60 mL/min (ref >60)
GLUCOSE: 85 mg/dL (ref 70–99)
POTASSIUM: 3.8 mmol/L (ref 3.5–5.1)
SODIUM: 140 mmol/L (ref 135–145)
Total Bilirubin: 0.3 mg/dL (ref 0.3–1.2)
Total Protein: 7.6 g/dL (ref 6.5–8.1)

## 2017-11-28 LAB — CBC WITH DIFFERENTIAL (CANCER CENTER ONLY)
BASOS ABS: 0 10*3/uL (ref 0.0–0.1)
BASOS PCT: 1 %
Eosinophils Absolute: 0 10*3/uL (ref 0.0–0.5)
Eosinophils Relative: 1 %
HEMATOCRIT: 38.2 % (ref 34.8–46.6)
HEMOGLOBIN: 13.1 g/dL (ref 11.6–15.9)
Lymphocytes Relative: 35 %
Lymphs Abs: 1.3 10*3/uL (ref 0.9–3.3)
MCH: 30 pg (ref 25.1–34.0)
MCHC: 34.3 g/dL (ref 31.5–36.0)
MCV: 87.4 fL (ref 79.5–101.0)
MONOS PCT: 13 %
Monocytes Absolute: 0.5 10*3/uL (ref 0.1–0.9)
NEUTROS ABS: 1.9 10*3/uL (ref 1.5–6.5)
NEUTROS PCT: 50 %
Platelet Count: 192 10*3/uL (ref 145–400)
RBC: 4.37 MIL/uL (ref 3.70–5.45)
RDW: 13.1 % (ref 11.2–14.5)
WBC Count: 3.8 10*3/uL — ABNORMAL LOW (ref 3.9–10.3)

## 2017-11-28 MED ORDER — SODIUM CHLORIDE 0.9% FLUSH
10.0000 mL | INTRAVENOUS | Status: DC | PRN
  Administered 2017-11-28: 10 mL
  Filled 2017-11-28: qty 10

## 2017-11-28 MED ORDER — SODIUM CHLORIDE 0.9 % IV SOLN
2.0000 mg | Freq: Once | INTRAVENOUS | Status: AC
  Administered 2017-11-28: 2 mg via INTRAVENOUS
  Filled 2017-11-28: qty 4

## 2017-11-28 MED ORDER — SODIUM CHLORIDE 0.9 % IV SOLN
Freq: Once | INTRAVENOUS | Status: AC
  Administered 2017-11-28: 16:00:00 via INTRAVENOUS

## 2017-11-28 MED ORDER — ONDANSETRON HCL 4 MG/2ML IJ SOLN
8.0000 mg | Freq: Once | INTRAMUSCULAR | Status: AC
  Administered 2017-11-28: 8 mg via INTRAVENOUS

## 2017-11-28 MED ORDER — HEPARIN SOD (PORK) LOCK FLUSH 100 UNIT/ML IV SOLN
500.0000 [IU] | Freq: Once | INTRAVENOUS | Status: AC | PRN
  Administered 2017-11-28: 500 [IU]
  Filled 2017-11-28: qty 5

## 2017-11-28 MED ORDER — ONDANSETRON HCL 4 MG/2ML IJ SOLN
INTRAMUSCULAR | Status: AC
  Filled 2017-11-28: qty 4

## 2017-11-28 NOTE — Assessment & Plan Note (Deleted)
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 Neoadj chemo 12/24/14- 04/29/15 AC x 4 foll by Abraxane X 12 Rt Lumpectomy: Path CR 0/2 LN Adj XRT 07/23/15- 09/08/15  PET/CT scan 11/17/2016:Subcutaneous nodules in the neck, upper back, left arm, abdomen and pelvis  Patient progressed on Xeloda January 2019-07/04/2017 stopped due to progression of disease  Cerebellar mass diagnosed 07/12/2016: Resection followed by stereotactic radiation 07/29/2016  Current treatment:Halaven days 1 and 8 every 3 weeks today cycle6day 1  Chemo toxicities:  Our plan is to obtain scans after the cycle to assess response to therapy. Return to clinic in 3 weeks to go over the results and follow-up on the scans.

## 2017-11-28 NOTE — Progress Notes (Signed)
Per Becky Sax, per Dr. Lindi Adie: Pt was late for all her appts today and Dr. Lindi Adie is not able to see her before her infusion. Pt will still have labs drawn and receive treatment if labs are within parameters, but she will not see Dr. Lindi Adie this week. On assessment pt denies any symptoms/complications since last infusion and vitals are stable. Pharmacy updated on plan.

## 2017-11-28 NOTE — Progress Notes (Signed)
Completed on 11/28/2017 

## 2017-11-28 NOTE — Patient Instructions (Signed)
[facility] Discharge Instructions for Patients Receiving Chemotherapy  Today you received the following chemotherapy agents: Eribulin mesylate ([place])  To help prevent nausea and vomiting after your treatment, we encourage you to take your nausea medication as prescribed.    If you develop nausea and vomiting that is not controlled by your nausea medication, call the clinic.   BELOW ARE SYMPTOMS THAT SHOULD BE REPORTED IMMEDIATELY:  *FEVER GREATER THAN 100.5 F  *CHILLS WITH OR WITHOUT FEVER  NAUSEA AND VOMITING THAT IS NOT CONTROLLED WITH YOUR NAUSEA MEDICATION  *UNUSUAL SHORTNESS OF BREATH  *UNUSUAL BRUISING OR BLEEDING  TENDERNESS IN MOUTH AND THROAT WITH OR WITHOUT PRESENCE OF ULCERS  *URINARY PROBLEMS  *BOWEL PROBLEMS  UNUSUAL RASH Items with * indicate a potential emergency and should be followed up as soon as possible.  Feel free to call the clinic should you have any questions or concerns. The clinic phone number is [phone].  Please show the [facility] at check-in to the Emergency Department and triage nurse.

## 2017-12-05 ENCOUNTER — Inpatient Hospital Stay: Payer: BLUE CROSS/BLUE SHIELD

## 2017-12-05 ENCOUNTER — Telehealth: Payer: Self-pay | Admitting: *Deleted

## 2017-12-05 ENCOUNTER — Other Ambulatory Visit: Payer: Self-pay | Admitting: *Deleted

## 2017-12-05 VITALS — BP 128/91 | HR 69 | Temp 98.5°F | Resp 17 | Ht 63.0 in

## 2017-12-05 DIAGNOSIS — Z171 Estrogen receptor negative status [ER-]: Principal | ICD-10-CM

## 2017-12-05 DIAGNOSIS — C50511 Malignant neoplasm of lower-outer quadrant of right female breast: Secondary | ICD-10-CM

## 2017-12-05 DIAGNOSIS — Z5111 Encounter for antineoplastic chemotherapy: Secondary | ICD-10-CM | POA: Diagnosis not present

## 2017-12-05 DIAGNOSIS — C792 Secondary malignant neoplasm of skin: Secondary | ICD-10-CM

## 2017-12-05 LAB — CMP (CANCER CENTER ONLY)
ALT: 12 U/L (ref 0–44)
ANION GAP: 6 (ref 5–15)
AST: 15 U/L (ref 15–41)
Albumin: 3.9 g/dL (ref 3.5–5.0)
Alkaline Phosphatase: 92 U/L (ref 38–126)
BILIRUBIN TOTAL: 0.4 mg/dL (ref 0.3–1.2)
BUN: 12 mg/dL (ref 6–20)
CO2: 26 mmol/L (ref 22–32)
Calcium: 9.6 mg/dL (ref 8.9–10.3)
Chloride: 107 mmol/L (ref 98–111)
Creatinine: 0.68 mg/dL (ref 0.44–1.00)
Glucose, Bld: 89 mg/dL (ref 70–99)
Potassium: 3.8 mmol/L (ref 3.5–5.1)
Sodium: 139 mmol/L (ref 135–145)
TOTAL PROTEIN: 7.4 g/dL (ref 6.5–8.1)

## 2017-12-05 LAB — CBC WITH DIFFERENTIAL (CANCER CENTER ONLY)
BASOS ABS: 0 10*3/uL (ref 0.0–0.1)
Basophils Relative: 1 %
EOS PCT: 0 %
Eosinophils Absolute: 0 10*3/uL (ref 0.0–0.5)
HCT: 38.1 % (ref 34.8–46.6)
Hemoglobin: 12.7 g/dL (ref 11.6–15.9)
LYMPHS ABS: 1.4 10*3/uL (ref 0.9–3.3)
LYMPHS PCT: 31 %
MCH: 29.2 pg (ref 25.1–34.0)
MCHC: 33.2 g/dL (ref 31.5–36.0)
MCV: 87.8 fL (ref 79.5–101.0)
Monocytes Absolute: 0.3 10*3/uL (ref 0.1–0.9)
Monocytes Relative: 6 %
Neutro Abs: 2.8 10*3/uL (ref 1.5–6.5)
Neutrophils Relative %: 62 %
Platelet Count: 189 10*3/uL (ref 145–400)
RBC: 4.33 MIL/uL (ref 3.70–5.45)
RDW: 14 % (ref 11.2–14.5)
WBC Count: 4.5 10*3/uL (ref 3.9–10.3)

## 2017-12-05 MED ORDER — ONDANSETRON HCL 4 MG/2ML IJ SOLN
INTRAMUSCULAR | Status: AC
  Filled 2017-12-05: qty 4

## 2017-12-05 MED ORDER — SODIUM CHLORIDE 0.9% FLUSH
10.0000 mL | INTRAVENOUS | Status: DC | PRN
  Administered 2017-12-05: 10 mL
  Filled 2017-12-05: qty 10

## 2017-12-05 MED ORDER — SODIUM CHLORIDE 0.9 % IV SOLN
Freq: Once | INTRAVENOUS | Status: AC
  Administered 2017-12-05: 16:00:00 via INTRAVENOUS

## 2017-12-05 MED ORDER — HEPARIN SOD (PORK) LOCK FLUSH 100 UNIT/ML IV SOLN
500.0000 [IU] | Freq: Once | INTRAVENOUS | Status: AC | PRN
  Administered 2017-12-05: 500 [IU]
  Filled 2017-12-05: qty 5

## 2017-12-05 MED ORDER — SODIUM CHLORIDE 0.9 % IV SOLN
2.0000 mg | Freq: Once | INTRAVENOUS | Status: AC
  Administered 2017-12-05: 2 mg via INTRAVENOUS
  Filled 2017-12-05: qty 4

## 2017-12-05 MED ORDER — ONDANSETRON HCL 4 MG/2ML IJ SOLN
8.0000 mg | Freq: Once | INTRAMUSCULAR | Status: AC
  Administered 2017-12-05: 8 mg via INTRAVENOUS

## 2017-12-05 NOTE — Telephone Encounter (Signed)
Called pt to discuss next steps. Informed pt we have ordered CT CAP and will get scheduled next week followed by appt with Dr. Lindi Adie and the start of Halaven cycle 7. Received verbal understanding.

## 2017-12-05 NOTE — Patient Instructions (Signed)
[facility] Discharge Instructions for Patients Receiving Chemotherapy  Today you received the following chemotherapy agents: Eribulin mesylate (Halaven)  To help prevent nausea and vomiting after your treatment, we encourage you to take your nausea medication as prescribed.    If you develop nausea and vomiting that is not controlled by your nausea medication, call the clinic.   BELOW ARE SYMPTOMS THAT SHOULD BE REPORTED IMMEDIATELY:  *FEVER GREATER THAN 100.5 F  *CHILLS WITH OR WITHOUT FEVER  NAUSEA AND VOMITING THAT IS NOT CONTROLLED WITH YOUR NAUSEA MEDICATION  *UNUSUAL SHORTNESS OF BREATH  *UNUSUAL BRUISING OR BLEEDING  TENDERNESS IN MOUTH AND THROAT WITH OR WITHOUT PRESENCE OF ULCERS  *URINARY PROBLEMS  *BOWEL PROBLEMS  UNUSUAL RASH Items with * indicate a potential emergency and should be followed up as soon as possible.  Feel free to call the clinic should you have any questions or concerns. The clinic phone number is [phone].  Please show the [facility] at check-in to the Emergency Department and triage nurse.

## 2017-12-06 ENCOUNTER — Telehealth: Payer: Self-pay | Admitting: *Deleted

## 2017-12-06 NOTE — Telephone Encounter (Signed)
CALLED PATIENT TO ASK ABOUT MOVING FU APPT. TO 12/20/17 @ 2:30 PM PER [name] BRUNING REQUEST, NO ANSWER WILL CALL LATER

## 2017-12-07 ENCOUNTER — Telehealth: Payer: Self-pay | Admitting: *Deleted

## 2017-12-07 ENCOUNTER — Telehealth: Payer: Self-pay | Admitting: Hematology and Oncology

## 2017-12-07 NOTE — Telephone Encounter (Signed)
CALLED PATIENT TO INFORM OF MRI BEING MOVED TO 12-17-17 - ARRIVAL TIME - 9:50 AM @  IMAGING, SPOKE WITH PATIENT AND SHE IS AWARE OF THIS TEST AND IS GOOD WITH THIS

## 2017-12-07 NOTE — Telephone Encounter (Signed)
Spoke to patient regarding upcoming appts per 7/16 sch message . Patient scheduled 7/30 due to avail in treatment area.

## 2017-12-09 ENCOUNTER — Telehealth: Payer: Self-pay

## 2017-12-09 NOTE — Telephone Encounter (Signed)
Outgoing call to patient per in-basket from [place] dated 12-05-17 for Dr Geralyn Flash nurse to f/u with pt this week.  Per Katelin's notes, pt reported she had a "much harder time last week" after her Halaven infusion.  Pt reports 'that was 2 weeks ago' and verbalized is ok, that she will see [place] at next appt. No other needs per pt at this time.

## 2017-12-09 NOTE — Progress Notes (Addendum)
Kaitlyn Keith. [name] 52 y.o. 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   Headache:Yes for the ;past two weeks did not take any pain medication Pain:No Dizziness:No Nausea/vomiting:Yes form chemotherapy Ringing in ears:No Visual changes (Blurred/ diplopia double vision,blind spots, and peripheral vsion changes) No Fatigue:Yes Cognitive changes:Alert and oriented x 3 with fluent speech,able to complete sentences without difficulty with word finding or organization of sentences. Weight: Wt Readings from Last 3 Encounters:  12/20/17 178 lb 12.8 oz (81.1 kg)  12/20/17 178 lb 12.8 oz (81.1 kg)  11/28/17 176 lb (79.8 kg)   BP (!) 150/90 (BP Location: Left Arm, Patient Position: Sitting, Cuff Size: Normal)   Pulse 80   Temp 97.6 F (36.4 C) (Oral)   Resp 18   Ht 5\' 3"  (1.6 m)   Wt 178 lb 12.8 oz (81.1 kg)   SpO2 95%   BMI 31.67 kg/m

## 2017-12-12 ENCOUNTER — Ambulatory Visit (HOSPITAL_COMMUNITY)
Admission: RE | Admit: 2017-12-12 | Discharge: 2017-12-12 | Disposition: A | Discharge status: Home / Self Care | Payer: BLUE CROSS/BLUE SHIELD | Source: Ambulatory Visit | Attending: Hematology and Oncology | Admitting: Hematology and Oncology

## 2017-12-12 ENCOUNTER — Encounter (HOSPITAL_COMMUNITY): Payer: Self-pay

## 2017-12-12 DIAGNOSIS — R229 Localized swelling, mass and lump, unspecified: Secondary | ICD-10-CM | POA: Insufficient documentation

## 2017-12-12 DIAGNOSIS — C50511 Malignant neoplasm of lower-outer quadrant of right female breast: Secondary | ICD-10-CM | POA: Insufficient documentation

## 2017-12-12 DIAGNOSIS — Z171 Estrogen receptor negative status [ER-]: Secondary | ICD-10-CM | POA: Insufficient documentation

## 2017-12-12 MED ORDER — IOPAMIDOL (ISOVUE-300) INJECTION 61%
100.0000 mL | Freq: Once | INTRAVENOUS | Status: AC | PRN
  Administered 2017-12-12: 100 mL via INTRAVENOUS

## 2017-12-12 MED ORDER — IOPAMIDOL (ISOVUE-300) INJECTION 61%
INTRAVENOUS | Status: AC
  Filled 2017-12-12: qty 100

## 2017-12-17 ENCOUNTER — Ambulatory Visit
Admission: RE | Admit: 2017-12-17 | Discharge: 2017-12-17 | Disposition: A | Discharge status: Home / Self Care | Payer: BLUE CROSS/BLUE SHIELD | Source: Ambulatory Visit | Attending: Radiation Oncology | Admitting: Radiation Oncology

## 2017-12-17 DIAGNOSIS — C7949 Secondary malignant neoplasm of other parts of nervous system: Principal | ICD-10-CM

## 2017-12-17 DIAGNOSIS — C7931 Secondary malignant neoplasm of brain: Secondary | ICD-10-CM

## 2017-12-17 MED ORDER — GADOBENATE DIMEGLUMINE 529 MG/ML IV SOLN
16.0000 mL | Freq: Once | INTRAVENOUS | Status: AC | PRN
  Administered 2017-12-17: 16 mL via INTRAVENOUS

## 2017-12-19 ENCOUNTER — Other Ambulatory Visit: Payer: BLUE CROSS/BLUE SHIELD

## 2017-12-20 ENCOUNTER — Ambulatory Visit
Admission: RE | Admit: 2017-12-20 | Discharge: 2017-12-20 | Disposition: A | Discharge status: Home / Self Care | Payer: BLUE CROSS/BLUE SHIELD | Source: Ambulatory Visit | Attending: Urology | Admitting: Urology

## 2017-12-20 ENCOUNTER — Inpatient Hospital Stay: Payer: BLUE CROSS/BLUE SHIELD

## 2017-12-20 ENCOUNTER — Inpatient Hospital Stay (HOSPITAL_BASED_OUTPATIENT_CLINIC_OR_DEPARTMENT_OTHER): Payer: BLUE CROSS/BLUE SHIELD | Admitting: Hematology and Oncology

## 2017-12-20 ENCOUNTER — Encounter: Payer: Self-pay | Admitting: Urology

## 2017-12-20 VITALS — BP 150/90 | HR 80 | Temp 97.6°F | Resp 18 | Ht 63.0 in | Wt 178.8 lb

## 2017-12-20 DIAGNOSIS — C792 Secondary malignant neoplasm of skin: Secondary | ICD-10-CM | POA: Diagnosis not present

## 2017-12-20 DIAGNOSIS — M545 Low back pain: Secondary | ICD-10-CM | POA: Insufficient documentation

## 2017-12-20 DIAGNOSIS — C50511 Malignant neoplasm of lower-outer quadrant of right female breast: Secondary | ICD-10-CM

## 2017-12-20 DIAGNOSIS — M129 Arthropathy, unspecified: Secondary | ICD-10-CM | POA: Diagnosis not present

## 2017-12-20 DIAGNOSIS — F329 Major depressive disorder, single episode, unspecified: Secondary | ICD-10-CM | POA: Insufficient documentation

## 2017-12-20 DIAGNOSIS — Z1509 Genetic susceptibility to other malignant neoplasm: Secondary | ICD-10-CM | POA: Insufficient documentation

## 2017-12-20 DIAGNOSIS — F419 Anxiety disorder, unspecified: Secondary | ICD-10-CM | POA: Insufficient documentation

## 2017-12-20 DIAGNOSIS — R232 Flushing: Secondary | ICD-10-CM | POA: Insufficient documentation

## 2017-12-20 DIAGNOSIS — C786 Secondary malignant neoplasm of retroperitoneum and peritoneum: Secondary | ICD-10-CM | POA: Diagnosis not present

## 2017-12-20 DIAGNOSIS — Z171 Estrogen receptor negative status [ER-]: Secondary | ICD-10-CM

## 2017-12-20 DIAGNOSIS — I10 Essential (primary) hypertension: Secondary | ICD-10-CM | POA: Insufficient documentation

## 2017-12-20 DIAGNOSIS — Z79899 Other long term (current) drug therapy: Secondary | ICD-10-CM | POA: Diagnosis not present

## 2017-12-20 DIAGNOSIS — G620 Drug-induced polyneuropathy: Secondary | ICD-10-CM

## 2017-12-20 DIAGNOSIS — C7931 Secondary malignant neoplasm of brain: Secondary | ICD-10-CM | POA: Diagnosis not present

## 2017-12-20 DIAGNOSIS — Z5111 Encounter for antineoplastic chemotherapy: Secondary | ICD-10-CM | POA: Diagnosis not present

## 2017-12-20 LAB — CBC WITH DIFFERENTIAL (CANCER CENTER ONLY)
Basophils Absolute: 0 10*3/uL (ref 0.0–0.1)
Basophils Relative: 1 %
EOS ABS: 0 10*3/uL (ref 0.0–0.5)
EOS PCT: 1 %
HCT: 36 % (ref 34.8–46.6)
Hemoglobin: 11.8 g/dL (ref 11.6–15.9)
LYMPHS ABS: 1.5 10*3/uL (ref 0.9–3.3)
LYMPHS PCT: 35 %
MCH: 29 pg (ref 25.1–34.0)
MCHC: 32.8 g/dL (ref 31.5–36.0)
MCV: 88.5 fL (ref 79.5–101.0)
MONO ABS: 0.6 10*3/uL (ref 0.1–0.9)
Monocytes Relative: 14 %
Neutro Abs: 2.2 10*3/uL (ref 1.5–6.5)
Neutrophils Relative %: 49 %
PLATELETS: 195 10*3/uL (ref 145–400)
RBC: 4.07 MIL/uL (ref 3.70–5.45)
RDW: 13.3 % (ref 11.2–14.5)
WBC: 4.3 10*3/uL (ref 3.9–10.3)

## 2017-12-20 LAB — CMP (CANCER CENTER ONLY)
ALT: 10 U/L (ref 0–44)
ANION GAP: 6 (ref 5–15)
AST: 15 U/L (ref 15–41)
Albumin: 3.6 g/dL (ref 3.5–5.0)
Alkaline Phosphatase: 103 U/L (ref 38–126)
BUN: 12 mg/dL (ref 6–20)
CHLORIDE: 106 mmol/L (ref 98–111)
CO2: 28 mmol/L (ref 22–32)
Calcium: 8.9 mg/dL (ref 8.9–10.3)
Creatinine: 0.71 mg/dL (ref 0.44–1.00)
GFR, Est AFR Am: >60 mL/min (ref >60)
GFR, Estimated: >60 mL/min (ref >60)
Glucose, Bld: 86 mg/dL (ref 70–99)
POTASSIUM: 3.8 mmol/L (ref 3.5–5.1)
SODIUM: 140 mmol/L (ref 135–145)
Total Bilirubin: 0.4 mg/dL (ref 0.3–1.2)
Total Protein: 7 g/dL (ref 6.5–8.1)

## 2017-12-20 MED ORDER — SODIUM CHLORIDE 0.9% FLUSH
10.0000 mL | INTRAVENOUS | Status: DC | PRN
  Administered 2017-12-20: 10 mL
  Filled 2017-12-20: qty 10

## 2017-12-20 MED ORDER — ONDANSETRON HCL 4 MG/2ML IJ SOLN
INTRAMUSCULAR | Status: AC
  Filled 2017-12-20: qty 4

## 2017-12-20 MED ORDER — HEPARIN SOD (PORK) LOCK FLUSH 100 UNIT/ML IV SOLN
500.0000 [IU] | Freq: Once | INTRAVENOUS | Status: AC | PRN
  Administered 2017-12-20: 500 [IU]
  Filled 2017-12-20: qty 5

## 2017-12-20 MED ORDER — ERIBULIN MESYLATE CHEMO INJECTION 1 MG/2ML
2.0000 mg | Freq: Once | INTRAVENOUS | Status: AC
  Administered 2017-12-20: 2 mg via INTRAVENOUS
  Filled 2017-12-20: qty 4

## 2017-12-20 MED ORDER — ONDANSETRON HCL 4 MG/2ML IJ SOLN
8.0000 mg | Freq: Once | INTRAMUSCULAR | Status: AC
  Administered 2017-12-20: 8 mg via INTRAVENOUS

## 2017-12-20 MED ORDER — SODIUM CHLORIDE 0.9 % IV SOLN
Freq: Once | INTRAVENOUS | Status: AC
  Administered 2017-12-20: 15:00:00 via INTRAVENOUS
  Filled 2017-12-20: qty 250

## 2017-12-20 NOTE — Progress Notes (Signed)
Radiation Oncology         [phone] ________________________________  Name: Kaitlyn Keith MRN: [id number]  Date: 12/20/2017  DOB: 04/16/1966  Follow-Up Visit Note  CC: Gudena, Vinay, MD  Ditty, Benjamin Jared, *  Diagnosis:    51 y.o. 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        ICD-10-CM   1. 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 Left Flank / 30 Gy in 10 fractions 2. Left Groin / 30 Gy in 10 fractions  07/26/16 Preop SRS Treatment: PTV1 Right Cerebellum was treated to 18 Gy in 1 fraction  07/23/15-09/05/15: 50.4 Gy to the right breast + 10 Gy boost  Narrative:   Kaitlyn Keith is a pleasant 51 y.o. 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 She was diagnosed with her cancer in July 2016, and underwent neoadjuvant chemotherapy followed by right lumpectomy and adjuvant radiation. She was found to have recurrent disease in February 2018 with a right cerebellar mass, and underwent preoperative SRS treatment followed by surgical resection on 07/29/2016. She is being followed in our brain oncology conference, and her last MRI scan on 7/27//19 revealed stability of her previously treated site of disease, with stability of her resection cavity, and no evidence for recurrent or residual metastatic disease to the brain. She comes today to review these findings.  She developed multiple subcutaneous nodules in the neck, upper back, left arm, abdomen and pelvis, noted on PET scan from 11/17/16. She was started on Everolimus on 12/31/16 but was discontinued 04/11/17 due to disease progression with interval development of subcutaneous nodules in the ventral lower left pelvic wall and left flank  and interval growth of 3 scattered peritoneal metastases. She started Xeloda in 05/2017 but discontinued due to disease progression on re-staging scans from 06/27/17 which showed a mixed response to therapy with some regression of the previously noted intraperitoneal implants but other lesions with significant interval growth including subcutaneous nodules in the left flank, left vulvar region, right inguinal LAN and anterior mediastinal LAN. She elected to move forward with palliative XRT to 2 painful subcutaneous lesions in the left flank and left pubic/vulvar region which was completed in March 2019.  She tolerated radiotherapy very well and had an excellent response with decreased size of both treated lesions.  Her systemic chemotherapy was switched to Halaven- started on 07/25/17 and has completed 5 cycles to date.  Recent follow up systemic imaging from 12/12/17 again shows a mixed response to systemic therapy with a decrease in size of the lower mid abdominal nodule and decrease in size of left supraclavicular lymph node but increased size/development of a new anterior abdominal nodule as well as interval increase in size of a right inguinal lymph node.  On review of systems, the patient reports that she is doing well overall. She denies any chest pain, shortness of breath, cough, fevers, chills, night sweats, or unintended weight changes. She denies any bowel or bladder disturbances, and denies abdominal pain, nausea or vomiting. She denies new visual or auditory changes, dizziness, imbalance, tremors or seizure activity. She has been having occasional headaches, often occurring in the mornings but do not wake her from sleep and resolve on their own   without need for tylenol or advil.  She denies associated N/V with headaches and rates the HA 1-2 out of 10 on the pain scale. She denies any new musculoskeletal or joint aches or pains, new skin lesions or concerns. A complete review of systems is obtained and is  otherwise negative.   Past Medical History:  Past Medical History:  Diagnosis Date  . Anxiety   . Arthritis   . Back pain   . Brain cancer (HCC)    brian met from triple negative breast ca  . Breast cancer (HCC)   . Breast cancer of lower-outer quadrant of right female breast (HCC) 12/05/2014  . Depression   . FH: chemotherapy 12/2014-04/2015  . Genetic testing 12/10/2016   Kaitlyn Keith underwent genetic counseling and testing for hereditary cancer syndromes on 11/18/2016. 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 Mutations in MSH6 are associated with a hereditary cancer syndrome called Lynch syndrome. For more detailed discussion, please see genetic counseling documentation from 12/10/2016.  Testing was perfo  . Headache    due to brain cancer, no longer having them  . Hot flashes   . Hypertension   . 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 Mutations in MSH6 are associated with a hereditary cancer syndrome called Lynch syndrome. For more detailed discussion, please see genetic counseling documentation from 12/10/2016.  Testing was perfo  . 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 Radiation 07/23/15-09/05/15   right breast 50.4 Gy, boost of 10 Gy    Past Surgical History: Past Surgical History:  Procedure Laterality Date  . APPLICATION OF CRANIAL NAVIGATION Right 07/27/2016   Procedure: APPLICATION OF CRANIAL NAVIGATION;  Surgeon: Benjamin Jared Ditty, MD;  Location: [facility] OR;  Service: Neurosurgery;  Laterality: Right;  . BIOPSY OF SKIN SUBCUTANEOUS TISSUE AND/OR MUCOUS MEMBRANE Left 12/24/2016   Procedure: OPEN BIOPSY LESIONS ON LEFT LOWER BACK AND SHOULDER BLADE;  Surgeon: Toth, Paul III, MD;   Location: [facility] OR;  Service: General;  Laterality: Left;  . BREAST LUMPECTOMY WITH NEEDLE LOCALIZATION AND AXILLARY SENTINEL LYMPH NODE BX Right 06/16/2015   Procedure: BREAST LUMPECTOMY WITH NEEDLE LOCALIZATION AND AXILLARY SENTINEL LYMPH NODE BX;  Surgeon: Paul Toth III, MD;  Location: [facility];  Service: General;  Laterality: Right;  . BREAST REDUCTION SURGERY    . CRANIOTOMY Right 07/27/2016   Procedure: Right Suboccipital craniotomy for tumor resection with stereotactic navigation;  Surgeon: Benjamin Jared Ditty, MD;  Location: [facility] OR;  Service: Neurosurgery;  Laterality: Right;  . PORT-A-CATH REMOVAL N/A 06/16/2015   Procedure: REMOVAL PORT-A-CATH;  Surgeon: Paul Toth III, MD;  Location: [facility];  Service: General;  Laterality: N/A;  . PORTACATH PLACEMENT N/A 12/23/2014   Procedure: INSERTION PORT-A-CATH;  Surgeon: Paul Toth III, MD;  Location: [facility];  Service: General;  Laterality: N/A;  . PORTACATH PLACEMENT Left 07/08/2017   Procedure: INSERTION PORT-A-CATH;  Surgeon: Toth, Paul III, MD;  Location: [facility];  Service: General;  Laterality: Left;    Social History:  Social History   Socioeconomic History  . Marital status: Divorced    Spouse name: Not on file  . Number of children: 1  . Years of education: Not on file  . Highest education level: Not   on file  Occupational History  . Not on file  Social Needs  . Financial resource strain: Not on file  . Food insecurity:    Worry: Not on file    Inability: Not on file  . Transportation needs:    Medical: Not on file    Non-medical: Not on file  Tobacco Use  . Smoking status: Never Smoker  . Smokeless tobacco: Never Used  Substance and Sexual Activity  . Alcohol use: Yes    Comment: social  . Drug use: No  . Sexual activity: Yes  Lifestyle  . Physical activity:    Days per week: Not on file    Minutes per session: Not on file  . Stress: Not on file    Relationships  . Social connections:    Talks on phone: Not on file    Gets together: Not on file    Attends religious service: Not on file    Active member of club or organization: Not on file    Attends meetings of clubs or organizations: Not on file    Relationship status: Not on file  . Intimate partner violence:    Fear of current or ex partner: Not on file    Emotionally abused: Not on file    Physically abused: Not on file    Forced sexual activity: Not on file  Other Topics Concern  . Not on file  Social History Narrative  . Not on file  The patient is divorced. 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 Aneurysm Mother 55       d.55  . Heart attack Father 62       d.62  . Endometrial cancer Sister 50  . Lung cancer Maternal Uncle        d.68s  . Cancer Maternal Grandmother        unspecified type-possibly stomach d.88s                          ALLERGIES:  has No Known Allergies.  Meds: Current Outpatient Medications  Medication Sig Dispense Refill  . ALPRAZolam (XANAX) 1 MG tablet Take 2 tablets (2 mg total) by mouth at bedtime. 60 tablet 1  . gabapentin (NEURONTIN) 100 MG capsule Take 1 capsule (100 mg total) by mouth at bedtime. 30 capsule 6  . lidocaine-prilocaine (EMLA) cream APPLY TO AFFECTED AREA ONCE AS DIRECTED  3  . lisinopril-hydrochlorothiazide (PRINZIDE,ZESTORETIC) 20-12.5 MG tablet TAKE 1 TABLET BY MOUTH TWICE DAILY 180 tablet 0  . cyclobenzaprine (FLEXERIL) 5 MG tablet Take 1 tablet (5 mg total) by mouth 3 (three) times daily as needed for muscle spasms. (Patient not taking: Reported on 12/20/2017) 30 tablet 0  . HYDROcodone-acetaminophen (NORCO/VICODIN) 5-325 MG tablet Take 1-2 tablets by mouth every 6 (six) hours as needed for moderate pain or severe pain. (Patient not taking: Reported on 12/20/2017) 15 tablet 0   No current  facility-administered medications for this encounter.    Facility-Administered Medications Ordered in Other Encounters  Medication Dose Route Frequency Provider Last Rate Last Dose  . eriBULin mesylate (HALAVEN) 2 mg in sodium chloride 0.9 % 100 mL chemo infusion  2 mg Intravenous Once Gudena, Vinay, MD      . heparin lock flush 100 unit/mL  500 Units Intracatheter Once PRN Gudena, Vinay, MD      .   sodium chloride flush (NS) 0.9 % injection 10 mL  10 mL Intracatheter PRN [name], MD        Physical Findings:   height is 5' 3" (1.6 m) and weight is 178 lb 12.8 oz (81.1 kg). Her oral temperature is 97.6 F (36.4 C). Her blood pressure is 150/90 (abnormal) and her pulse is 80. Her respiration is 18 and oxygen saturation is 95%.   Pain Assessment Pain Score: 0-No pain/10  In general this is a well appearing African American female in no acute distress. She's alert and oriented x4 and appropriate throughout the examination. Cardiopulmonary assessment is negative for acute distress and she exhibits normal effort.  She appears grossly neurologically intact with normal EOMs bilaterally and PERRLA.  Strength is 5/5 and symmetric bilaterally and sensation is intact to light touch in the upper and lower extremities.   Lab Findings: Lab Results  Component Value Date   WBC 4.3 12/20/2017   WBC 4.4 04/04/2017   WBC 4.9 12/24/2016   HGB 11.8 12/20/2017   HGB 11.7 04/04/2017   HCT 36.0 12/20/2017   HCT 35.9 04/04/2017   PLT 195 12/20/2017   PLT 207 04/04/2017    Lab Results  Component Value Date   NA 140 12/20/2017   NA 139 04/04/2017   K 3.8 12/20/2017   K 3.7 04/04/2017   CHLORIDE 108 04/04/2017   CO2 28 12/20/2017   CO2 24 04/04/2017   GLUCOSE 86 12/20/2017   GLUCOSE 76 04/04/2017   BUN 12 12/20/2017   BUN 5.8 (L) 04/04/2017   CREATININE 0.71 12/20/2017   CREATININE 0.8 04/04/2017   BILITOT 0.4 12/20/2017   BILITOT 0.43 04/04/2017   ALKPHOS 103 12/20/2017   ALKPHOS 131  04/04/2017   AST 15 12/20/2017   AST 18 04/04/2017   ALT 10 12/20/2017   ALT 12 04/04/2017   PROT 7.0 12/20/2017   PROT 7.0 04/04/2017   ALBUMIN 3.6 12/20/2017   ALBUMIN 3.0 (L) 04/04/2017   CALCIUM 8.9 12/20/2017   CALCIUM 8.6 04/04/2017   ANIONGAP 6 12/20/2017    Radiographic Findings: Ct Chest W Contrast  Result Date: 12/12/2017 CLINICAL DATA:  Patient with history of metastatic breast cancer. Follow-up exam. EXAM: CT CHEST, ABDOMEN, AND PELVIS WITH CONTRAST TECHNIQUE: Multidetector CT imaging of the chest, abdomen and pelvis was performed following the standard protocol during bolus administration of intravenous contrast. CONTRAST:  18m ISOVUE-300 IOPAMIDOL (ISOVUE-300) INJECTION 61% COMPARISON:  CT CAP 09/23/2017. FINDINGS: CT CHEST FINDINGS Cardiovascular: Normal heart size. No pericardial effusion. Trace fluid superior pericardial recess. Aorta and main pulmonary artery normal in caliber. Mediastinum/Nodes: Postsurgical changes right axilla. No enlarged axillary, mediastinal or hilar lymphadenopathy. Normal appearance of the esophagus. Previously described left supraclavicular lymph node is no longer demonstrated (image 1; series 2). Similar-appearing anterior mediastinal soft tissue. Lungs/Pleura: Central airways are patent. Dependent atelectasis within the bilateral lower lobes. No large area pulmonary consolidation. No pleural effusion or pneumothorax. Musculoskeletal: No aggressive or acute appearing osseous lesions. Thoracic spine degenerative changes. CT ABDOMEN PELVIS FINDINGS Hepatobiliary: Similar-appearing 7 mm low-attenuation lesion right hepatic lobe (image 53; series 2). No new or enlarging hepatic lesion identified. Gallbladder is unremarkable. No intrahepatic or extrahepatic biliary ductal dilatation. Pancreas: Unremarkable Spleen: Unremarkable Adrenals/Urinary Tract: Adrenal glands are normal. Kidneys enhance symmetrically with contrast. Urinary bladder is unremarkable.  Stomach/Bowel: Normal morphology of the stomach. No evidence for bowel obstruction. No free fluid or free intraperitoneal air. Stool throughout the colon. Vascular/Lymphatic: Normal caliber abdominal aorta. No retroperitoneal  lymphadenopathy. Slight interval increase in size of right inguinal lymph node measuring 1.3 cm (image 100; series 2), previously 1.1 cm. Reproductive: Uterus and adnexal structures unremarkable. Other: Previously described anterior abdominal nodule is not definitely visualized. There is a new 1.2 x 1.7 cm nodule within the anterior abdomen, slightly lateral to the previously described anterior abdominal wall nodule which may represent a new nodule or enlarged previously described nodule. Interval decrease in size of lower abdominal mesenteric nodule measuring 1.1 x 1.4 Cm (image 80; series 2), previously 1.3 x 2.4 cm. Musculoskeletal: No aggressive or acute appearing osseous lesions. Continued interval decrease in size of subcutaneous nodule anterior to the left pubic bone measuring 8 x 4 mm (image 111; series 2), previously 9 x 5 mm. IMPRESSION: 1. Mixed response to therapy. 2. Interval decrease in size of lower mid abdominal nodule with interval increase in size/development of new anterior abdominal nodule. 3. Interval increase in size of right inguinal lymph node. 4. Suspect interval decrease in size of left supraclavicular lymph node versus non visualization. 5. Similar subcutaneous nodule anterior to the left pubic bone. 6. Similar-appearing soft tissue anterior mediastinum. Electronically Signed   By: Drew  Davis M.D.   On: 12/12/2017 11:19   Mr Brain W Wo Contrast  Result Date: 12/17/2017 CLINICAL DATA:  SRS. Seventeen month restaging. History of breast cancer with brain metastases. 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 EXAM: MRI HEAD WITHOUT AND WITH CONTRAST TECHNIQUE: Multiplanar, multiecho pulse sequences of the brain and surrounding structures were obtained  without and with intravenous contrast. CONTRAST:  16mL MULTIHANCE GADOBENATE DIMEGLUMINE 529 MG/ML IV SOLN COMPARISON:  MRI brain 09/10/2017 FINDINGS: Brain: Focal encephalomalacia in the lateral right cerebellum is stable, consistent with previously treated tumor. No residual or recurrent enhancement or tumor is present. No new focal areas of enhancement are present. MRI imaging of the brain is otherwise unremarkable. No acute infarct or hemorrhage is present. Supratentorial structures are within normal limits. Brainstem is normal. Left cerebellum is unremarkable. The internal auditory canals are within normal limits bilaterally. Vascular: Flow is present in the major intracranial arteries. Skull and upper cervical spine: Craniocervical junction is normal. Grade 2 anterolisthesis at C2-3 is chronic infused. Chronic marrow signal changes at C4-5 are stable. No other focal osseous lesions are evident. Sinuses/Orbits: Mild mucosal thickening is present within the anterior ethmoid air cells bilaterally in the inferior left frontal sinus. The paranasal sinuses in the mastoid air cells are otherwise clear. Globes and orbits are within normal limits. IMPRESSION: 1. Posttreatment changes in the right cerebellum. 2. No evidence for recurrent or residual metastatic disease to the brain. 3. Stable posttraumatic fusion at C2-3. 4. Chronic marrow changes at C4 and 5 are stable without evidence for metastatic disease to the osseous structures. Electronically Signed   By: [name]  Mattern M.D.   On: 12/17/2017 14:24   Ct Abdomen Pelvis W Contrast  Result Date: 12/12/2017 CLINICAL DATA:  Patient with history of metastatic breast cancer. Follow-up exam. EXAM: CT CHEST, ABDOMEN, AND PELVIS WITH CONTRAST TECHNIQUE: Multidetector CT imaging of the chest, abdomen and pelvis was performed following the standard protocol during bolus administration of intravenous contrast. CONTRAST:  100mL ISOVUE-300 IOPAMIDOL (ISOVUE-300)  INJECTION 61% COMPARISON:  CT CAP 09/23/2017. FINDINGS: CT CHEST FINDINGS Cardiovascular: Normal heart size. No pericardial effusion. Trace fluid superior pericardial recess. Aorta and main pulmonary artery normal in caliber. Mediastinum/Nodes: Postsurgical changes right axilla. No enlarged axillary, mediastinal or hilar lymphadenopathy. Normal appearance of the esophagus.   Previously described left supraclavicular lymph node is no longer demonstrated (image 1; series 2). Similar-appearing anterior mediastinal soft tissue. Lungs/Pleura: Central airways are patent. Dependent atelectasis within the bilateral lower lobes. No large area pulmonary consolidation. No pleural effusion or pneumothorax. Musculoskeletal: No aggressive or acute appearing osseous lesions. Thoracic spine degenerative changes. CT ABDOMEN PELVIS FINDINGS Hepatobiliary: Similar-appearing 7 mm low-attenuation lesion right hepatic lobe (image 53; series 2). No new or enlarging hepatic lesion identified. Gallbladder is unremarkable. No intrahepatic or extrahepatic biliary ductal dilatation. Pancreas: Unremarkable Spleen: Unremarkable Adrenals/Urinary Tract: Adrenal glands are normal. Kidneys enhance symmetrically with contrast. Urinary bladder is unremarkable. Stomach/Bowel: Normal morphology of the stomach. No evidence for bowel obstruction. No free fluid or free intraperitoneal air. Stool throughout the colon. Vascular/Lymphatic: Normal caliber abdominal aorta. No retroperitoneal lymphadenopathy. Slight interval increase in size of right inguinal lymph node measuring 1.3 cm (image 100; series 2), previously 1.1 cm. Reproductive: Uterus and adnexal structures unremarkable. Other: Previously described anterior abdominal nodule is not definitely visualized. There is a new 1.2 x 1.7 cm nodule within the anterior abdomen, slightly lateral to the previously described anterior abdominal wall nodule which may represent a new nodule or enlarged previously  described nodule. Interval decrease in size of lower abdominal mesenteric nodule measuring 1.1 x 1.4 Cm (image 80; series 2), previously 1.3 x 2.4 cm. Musculoskeletal: No aggressive or acute appearing osseous lesions. Continued interval decrease in size of subcutaneous nodule anterior to the left pubic bone measuring 8 x 4 mm (image 111; series 2), previously 9 x 5 mm. IMPRESSION: 1. Mixed response to therapy. 2. Interval decrease in size of lower mid abdominal nodule with interval increase in size/development of new anterior abdominal nodule. 3. Interval increase in size of right inguinal lymph node. 4. Suspect interval decrease in size of left supraclavicular lymph node versus non visualization. 5. Similar subcutaneous nodule anterior to the left pubic bone. 6. Similar-appearing soft tissue anterior mediastinum. Electronically Signed   By: Drew  Davis M.D.   On: 12/12/2017 11:19    Impression/Plan: 1. 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 Patient appears to be both clinically and radiographically stable, with her most recent MRI brain from 12/17/17 showing no evidence of recurrent or residual metastatic disease to the brain.  The recommendation is to continue with serial surveillance MRI brain scans every 3 months to monitor for disease recurrence or progression.   Her case and imaging will be reviewed in the multidisciplinary brain conference prior to a follow-up visit to discuss results and recommendations at that time.  Her most recent systemic imaging from 12/12/17 indicates a continued partial response to systemic treatment.  She met with her medical oncologist in follow-up prior to our office visit today and the recommendation was to continue with Halaven for systemic disease management under the care and direction of Dr. Gudena.  She will have repeat systemic imaging in 3 months.  She is in agreement with this plan.     [name]  W. Bruning, PA-C  

## 2017-12-20 NOTE — Progress Notes (Signed)
Patient Care Team: [name], MD as PCP - General (Hematology and Oncology) [name], [name], [name], [name], [name], [name], NP as Nurse Practitioner (Nurse Practitioner)  DIAGNOSIS:  Encounter Diagnosis  Name Primary?  . 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, estrogen receptor negative ([place])     SUMMARY OF ONCOLOGIC HISTORY:   Breast cancer of lower-outer quadrant of right female breast ([place])   12/03/2014 Mammogram    Right breast mass 1.9 cm it o'clock position 8 cm depth from the nipple      12/03/2014 Initial Diagnosis    Right breast biopsy 8:00: Invasive ductal carcinoma, grade 3, ER 0%, PR 0%, Ki-67 90%, HER-2 negative ratio 1.43      12/10/2014 Breast MRI    Right breast lower outer quadrant: 2.3 x 2.4 x 2.4 cm rim-enhancing mass abuts the pectoralis fascia but no enhancement of pectoralis muscle, second focus of artifact?'s second tissue marker clip, no lymph nodes      12/10/2014 Clinical Stage    Stage IIA: T2 N0      12/24/2014 - 04/29/2015 Neo-Adjuvant Chemotherapy    Dose dense Adriamycin and Cytoxan 4 followed by weekly Abraxane 12      05/02/2015 Breast MRI    complete radiologic response      06/16/2015 Surgery    Left Lumpectomy: Complete path Response, 0/2 LN      06/16/2015 Pathologic Stage    ypT0 ypN0      07/23/2015 - 09/05/2015 Radiation Therapy    Adjuvant RT: 50.4 Gy in 28 fractions and a boost of 10 Gy in 5 fractions to total dose of 60.4 Gy      10/24/2015 Survivorship    SCP mailed to patient in lieu of in person visit.      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 identified in BARD 1, BRCA2 and NF1      12/31/2016 Miscellaneous    Everolimus 10 mg daily for cycle 1 if she cannot tolerate will decrease to 7.5 mg daily      05/24/2017 - 07/04/2017 Chemotherapy    Xeloda 2000 mg 2 weeks on 1 week off stopped due to progression of disease based on CT scans done 06/27/2017      07/25/2017 -  Chemotherapy    Halaven days 1 and 8 every 3 weeks        CHIEF COMPLIANT: Follow-up on recently performed brain MRI as well as CT scans  INTERVAL HISTORY: Kaitlyn Keith is a 23-year with  above-mentioned his metastatic breast cancer is currently on Halaven chemotherapy and is here today to discuss the results of the scans.  She appears to be tolerating Halaven extremely well.  Apart from fatigue she has no other side effects.  Denies neuropathy.  Very mild nausea.  REVIEW OF SYSTEMS:   Constitutional: Denies fevers, chills or abnormal weight loss Eyes: Denies blurriness of vision Ears, nose, mouth, throat, and face: Denies mucositis or sore throat Respiratory: Denies cough, dyspnea or wheezes Cardiovascular: Denies palpitation, chest discomfort Gastrointestinal:  Denies nausea, heartburn or change in bowel habits Skin: Denies abnormal skin rashes Lymphatics: Denies new  lymphadenopathy or easy bruising Neurological:Denies numbness, tingling or new weaknesses Behavioral/Psych: Mood is stable, no new changes  Extremities: No lower extremity edema  All other systems were reviewed with the patient and are negative.  I have reviewed the past medical history, past surgical history, social history and family history with the patient and they are unchanged from previous note.  ALLERGIES:  has No Known Allergies.  MEDICATIONS:  Current Outpatient Medications  Medication Sig Dispense Refill  . ALPRAZolam (XANAX) 1 MG tablet Take 2 tablets (2 mg total) by mouth at bedtime. 60 tablet 1  . cyclobenzaprine (FLEXERIL) 5 MG tablet Take 1 tablet (5 mg total) by mouth 3 (three) times daily as needed for muscle spasms. 30 tablet 0  . gabapentin (NEURONTIN) 100 MG capsule Take 1 capsule (100 mg total) by mouth at bedtime. 30 capsule 6  . HYDROcodone-acetaminophen (NORCO/VICODIN) 5-325 MG tablet Take 1-2 tablets by mouth every 6 (six) hours as needed for moderate pain or severe pain. 15 tablet 0  . lidocaine-prilocaine (EMLA) cream APPLY TO AFFECTED AREA ONCE AS DIRECTED  3  . lisinopril-hydrochlorothiazide (PRINZIDE,ZESTORETIC) 20-12.5 MG tablet TAKE 1 TABLET BY MOUTH TWICE DAILY 180 tablet 0  . Wound Cleansers (RADIAPLEX EX) Apply topically.     No current facility-administered medications for this visit.     PHYSICAL EXAMINATION: ECOG PERFORMANCE STATUS: 1 - Symptomatic but completely ambulatory  Vitals:   12/20/17 1334  BP: (!) 155/113  Pulse: 67  Resp: 18  Temp: 98.5 F (36.9 C)  SpO2: 100%   Filed Weights   12/20/17 1334  Weight: 178 lb 12.8 oz (81.1 kg)    GENERAL:alert, no distress and comfortable SKIN: skin color, texture, turgor are normal, no rashes or significant lesions EYES: normal, Conjunctiva are pink and non-injected, sclera clear OROPHARYNX:no exudate, no erythema and lips, buccal mucosa, and tongue normal  NECK: supple, thyroid normal  size, non-tender, without nodularity LYMPH:  no palpable lymphadenopathy in the cervical, axillary or inguinal LUNGS: clear to auscultation and percussion with normal breathing effort HEART: regular rate & rhythm and no murmurs and no lower extremity edema ABDOMEN:abdomen soft, non-tender and normal bowel sounds MUSCULOSKELETAL:no cyanosis of digits and no clubbing  NEURO: alert & oriented x 3 with fluent speech, no focal motor/sensory deficits EXTREMITIES: No lower extremity edema   LABORATORY DATA:  I have reviewed the data as listed CMP Latest Ref Rng & Units 12/05/2017 11/28/2017 11/14/2017  Glucose 70 - 99 mg/dL 89 85 81  BUN 6 - 20 mg/dL '12 10 10  ' Creatinine 0.44 - 1.00 mg/dL 0.68 0.70 0.67  Sodium 135 - 145 mmol/L 139 140 139  Potassium 3.5 - 5.1 mmol/L 3.8 3.8 3.3(L)  Chloride 98 - 111 mmol/L 107 104 106  CO2 22 - 32 mmol/L '26 30 26  ' Calcium 8.9 - 10.3 mg/dL 9.6 9.9 9.3  Total Protein 6.5 -  8.1 g/dL 7.4 7.6 7.3  Total Bilirubin 0.3 - 1.2 mg/dL 0.4 0.3 0.4  Alkaline Phos 38 - 126 U/L 92 102 86  AST 15 - 41 U/L '15 18 18  ' ALT 0 - 44 U/L '12 14 8    ' Lab Results  Component Value Date   WBC 4.3 12/20/2017   HGB 11.8 12/20/2017   HCT 36.0 12/20/2017   MCV 88.5 12/20/2017   PLT 195 12/20/2017   NEUTROABS 2.2 12/20/2017    ASSESSMENT & PLAN:  Breast cancer of lower-outer quadrant of right female breast ([place]) Right breast biopsy 12/03/2014 8:00: Invasive ductal carcinoma, grade 3, ER 0%, PR 0%, 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 Neoadj chemo 12/24/14- 04/29/15 AC x 4 foll by Abraxane X 12 Rt Lumpectomy: Path CR 0/2 LN Adj XRT 07/23/15- 09/08/15  PET/CT scan 11/17/2016:Subcutaneous nodules in the neck, upper back, left arm, abdomen and pelvis  Patient progressed on Xeloda January 2019-07/04/2017 stopped due to progression of disease  Cerebellar mass  diagnosed 07/12/2016: Resection followed by stereotactic radiation 07/29/2016  Current treatment:Halaven days 1 and 8 every 3 weeks today cycle7day 1 Chemo toxicities: Severe fatigue Denies any nausea vomiting. 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  Post radiation changes in the right cerebellum  CT abdomen and pelvis 12/12/2017: Mixed response to therapy, interval decrease in the mid abdominal nodule (2.4 cm to 1.4 cm) and interval new anterior abdominal nodule (1.7 cm).  Increase in right inguinal lymph node (1.1 cm to 1.3 cm) and resolution of the left supraclavicular lymph node  Radiology review: I discussed with the patient these findings we discussed changing her treatment plan to receive Halaven once every 3 weeks instead of day 1 and day 8.  Patient is very worried about changing her treatment at this time.  She would like to do 3 more cycles and then consider decreasing the frequency of Halaven infusions.  Sore plan is to do 3 more cycles of Halaven and then perform scans.  Return to clinic every 3 weeks for follow-up with me.     No orders of the defined types were placed in this encounter.  The patient has a good understanding of the overall plan. she agrees with it. she will call with any problems that may develop before the next visit here.   Harriette Ohara, MD 12/20/17

## 2017-12-20 NOTE — Patient Instructions (Signed)
[facility] Discharge Instructions for Patients Receiving Chemotherapy  Today you received the following chemotherapy agents: Eribulin mesylate (Halaven)  To help prevent nausea and vomiting after your treatment, we encourage you to take your nausea medication as prescribed.    If you develop nausea and vomiting that is not controlled by your nausea medication, call the clinic.   BELOW ARE SYMPTOMS THAT SHOULD BE REPORTED IMMEDIATELY:  *FEVER GREATER THAN 100.5 F  *CHILLS WITH OR WITHOUT FEVER  NAUSEA AND VOMITING THAT IS NOT CONTROLLED WITH YOUR NAUSEA MEDICATION  *UNUSUAL SHORTNESS OF BREATH  *UNUSUAL BRUISING OR BLEEDING  TENDERNESS IN MOUTH AND THROAT WITH OR WITHOUT PRESENCE OF ULCERS  *URINARY PROBLEMS  *BOWEL PROBLEMS  UNUSUAL RASH Items with * indicate a potential emergency and should be followed up as soon as possible.  Feel free to call the clinic should you have any questions or concerns. The clinic phone number is [phone].  Please show the [place] at check-in to the Emergency Department and triage nurse.

## 2017-12-20 NOTE — Assessment & Plan Note (Signed)
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 Neoadj chemo 12/24/14- 04/29/15 AC x 4 foll by Abraxane X 12 Rt Lumpectomy: Path CR 0/2 LN Adj XRT 07/23/15- 09/08/15  PET/CT scan 11/17/2016:Subcutaneous nodules in the neck, upper back, left arm, abdomen and pelvis  Patient progressed on Xeloda January 2019-07/04/2017 stopped due to progression of disease  Cerebellar mass diagnosed 07/12/2016: Resection followed by stereotactic radiation 07/29/2016  Current treatment:Halaven days 1 and 8 every 3 weeks today cycle7day 1 Chemo toxicities: Severe fatigue Denies any nausea vomiting. 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  Post radiation changes in the right cerebellum  CT abdomen and pelvis 12/12/2017: Mixed response to therapy, interval decrease in the mid abdominal nodule (2.4 cm to 1.4 cm) and interval new anterior abdominal nodule (1.7 cm).  Increase in right inguinal lymph node (1.1 cm to 1.3 cm) and resolution of the left supraclavicular lymph node  Radiology review: I discussed with the patient these findings we discussed changing her treatment plan to receive Halaven once every 3 weeks instead of day 1 and day 8.  Hopefully this will prevent worsening of neuropathy.  This will be her maintenance therapy.  As long as she can tolerate it.  Return to clinic every 3 weeks for Halaven.

## 2017-12-21 ENCOUNTER — Ambulatory Visit: Payer: Self-pay | Admitting: Urology

## 2017-12-26 ENCOUNTER — Inpatient Hospital Stay: Payer: BLUE CROSS/BLUE SHIELD

## 2017-12-26 ENCOUNTER — Inpatient Hospital Stay: Payer: BLUE CROSS/BLUE SHIELD | Attending: Hematology and Oncology

## 2017-12-26 VITALS — BP 125/96 | HR 65 | Temp 98.0°F | Resp 18

## 2017-12-26 DIAGNOSIS — Z5111 Encounter for antineoplastic chemotherapy: Secondary | ICD-10-CM | POA: Diagnosis not present

## 2017-12-26 DIAGNOSIS — D701 Agranulocytosis secondary to cancer chemotherapy: Secondary | ICD-10-CM | POA: Diagnosis not present

## 2017-12-26 DIAGNOSIS — Z171 Estrogen receptor negative status [ER-]: Secondary | ICD-10-CM | POA: Insufficient documentation

## 2017-12-26 DIAGNOSIS — C50511 Malignant neoplasm of lower-outer quadrant of right female breast: Secondary | ICD-10-CM

## 2017-12-26 DIAGNOSIS — C7931 Secondary malignant neoplasm of brain: Secondary | ICD-10-CM | POA: Diagnosis present

## 2017-12-26 DIAGNOSIS — C792 Secondary malignant neoplasm of skin: Secondary | ICD-10-CM

## 2017-12-26 LAB — CBC WITH DIFFERENTIAL (CANCER CENTER ONLY)
Basophils Absolute: 0 10*3/uL (ref 0.0–0.1)
Basophils Relative: 1 %
EOS ABS: 0 10*3/uL (ref 0.0–0.5)
EOS PCT: 1 %
HCT: 39.4 % (ref 34.8–46.6)
Hemoglobin: 13.2 g/dL (ref 11.6–15.9)
LYMPHS ABS: 1.7 10*3/uL (ref 0.9–3.3)
Lymphocytes Relative: 32 %
MCH: 29 pg (ref 25.1–34.0)
MCHC: 33.4 g/dL (ref 31.5–36.0)
MCV: 87.1 fL (ref 79.5–101.0)
MONO ABS: 0.2 10*3/uL (ref 0.1–0.9)
MONOS PCT: 5 %
NEUTROS PCT: 61 %
Neutro Abs: 3.4 10*3/uL (ref 1.5–6.5)
Platelet Count: 222 10*3/uL (ref 145–400)
RBC: 4.53 MIL/uL (ref 3.70–5.45)
RDW: 13.9 % (ref 11.2–14.5)
WBC Count: 5.3 10*3/uL (ref 3.9–10.3)

## 2017-12-26 LAB — CMP (CANCER CENTER ONLY)
ALT: 14 U/L (ref 0–44)
ANION GAP: 11 (ref 5–15)
AST: 16 U/L (ref 15–41)
Albumin: 4 g/dL (ref 3.5–5.0)
Alkaline Phosphatase: 95 U/L (ref 38–126)
BUN: 12 mg/dL (ref 6–20)
CHLORIDE: 103 mmol/L (ref 98–111)
CO2: 25 mmol/L (ref 22–32)
CREATININE: 0.73 mg/dL (ref 0.44–1.00)
Calcium: 9.3 mg/dL (ref 8.9–10.3)
Glucose, Bld: 77 mg/dL (ref 70–99)
Potassium: 3.4 mmol/L — ABNORMAL LOW (ref 3.5–5.1)
Sodium: 139 mmol/L (ref 135–145)
Total Bilirubin: 0.5 mg/dL (ref 0.3–1.2)
Total Protein: 7.9 g/dL (ref 6.5–8.1)

## 2017-12-26 MED ORDER — SODIUM CHLORIDE 0.9 % IV SOLN
Freq: Once | INTRAVENOUS | Status: AC
  Administered 2017-12-26: 14:00:00 via INTRAVENOUS
  Filled 2017-12-26: qty 250

## 2017-12-26 MED ORDER — SODIUM CHLORIDE 0.9 % IV SOLN
2.0000 mg | Freq: Once | INTRAVENOUS | Status: AC
  Administered 2017-12-26: 2 mg via INTRAVENOUS
  Filled 2017-12-26: qty 4

## 2017-12-26 MED ORDER — ONDANSETRON HCL 4 MG/2ML IJ SOLN
INTRAMUSCULAR | Status: AC
Start: 2017-12-26 — End: ?
  Filled 2017-12-26: qty 4

## 2017-12-26 MED ORDER — HEPARIN SOD (PORK) LOCK FLUSH 100 UNIT/ML IV SOLN
500.0000 [IU] | Freq: Once | INTRAVENOUS | Status: AC | PRN
  Administered 2017-12-26: 500 [IU]
  Filled 2017-12-26: qty 5

## 2017-12-26 MED ORDER — SODIUM CHLORIDE 0.9% FLUSH
10.0000 mL | INTRAVENOUS | Status: DC | PRN
  Administered 2017-12-26: 10 mL
  Filled 2017-12-26: qty 10

## 2017-12-26 MED ORDER — ONDANSETRON HCL 4 MG/2ML IJ SOLN
8.0000 mg | Freq: Once | INTRAMUSCULAR | Status: AC
  Administered 2017-12-26: 8 mg via INTRAVENOUS

## 2017-12-26 NOTE — Patient Instructions (Signed)
[facility] Discharge Instructions for Patients Receiving Chemotherapy  Today you received the following chemotherapy agents Halaven  To help prevent nausea and vomiting after your treatment, we encourage you to take your nausea medication as directed If you develop nausea and vomiting that is not controlled by your nausea medication, call the clinic.   BELOW ARE SYMPTOMS THAT SHOULD BE REPORTED IMMEDIATELY:  *FEVER GREATER THAN 100.5 F  *CHILLS WITH OR WITHOUT FEVER  NAUSEA AND VOMITING THAT IS NOT CONTROLLED WITH YOUR NAUSEA MEDICATION  *UNUSUAL SHORTNESS OF BREATH  *UNUSUAL BRUISING OR BLEEDING  TENDERNESS IN MOUTH AND THROAT WITH OR WITHOUT PRESENCE OF ULCERS  *URINARY PROBLEMS  *BOWEL PROBLEMS  UNUSUAL RASH Items with * indicate a potential emergency and should be followed up as soon as possible.  Feel free to call the clinic should you have any questions or concerns. The clinic phone number is [phone].  Please show the CHEMO ALERT CARD at check-in to the Emergency Department and triage nurse.   

## 2018-01-09 ENCOUNTER — Inpatient Hospital Stay (HOSPITAL_BASED_OUTPATIENT_CLINIC_OR_DEPARTMENT_OTHER): Payer: BLUE CROSS/BLUE SHIELD | Admitting: Hematology and Oncology

## 2018-01-09 ENCOUNTER — Other Ambulatory Visit: Payer: Self-pay

## 2018-01-09 ENCOUNTER — Telehealth: Payer: Self-pay | Admitting: Hematology and Oncology

## 2018-01-09 ENCOUNTER — Inpatient Hospital Stay: Payer: BLUE CROSS/BLUE SHIELD

## 2018-01-09 ENCOUNTER — Other Ambulatory Visit: Payer: Self-pay | Admitting: *Deleted

## 2018-01-09 DIAGNOSIS — C792 Secondary malignant neoplasm of skin: Secondary | ICD-10-CM

## 2018-01-09 DIAGNOSIS — Z171 Estrogen receptor negative status [ER-]: Principal | ICD-10-CM

## 2018-01-09 DIAGNOSIS — D701 Agranulocytosis secondary to cancer chemotherapy: Secondary | ICD-10-CM

## 2018-01-09 DIAGNOSIS — C50511 Malignant neoplasm of lower-outer quadrant of right female breast: Secondary | ICD-10-CM

## 2018-01-09 DIAGNOSIS — C7931 Secondary malignant neoplasm of brain: Secondary | ICD-10-CM

## 2018-01-09 DIAGNOSIS — Z5111 Encounter for antineoplastic chemotherapy: Secondary | ICD-10-CM | POA: Diagnosis not present

## 2018-01-09 LAB — CBC WITH DIFFERENTIAL (CANCER CENTER ONLY)
Basophils Absolute: 0 10*3/uL (ref 0.0–0.1)
Basophils Absolute: 0 10*3/uL (ref 0.0–0.1)
Basophils Relative: 0 %
Basophils Relative: 1 %
EOS PCT: 2 %
EOS PCT: 2 %
Eosinophils Absolute: 0.1 10*3/uL (ref 0.0–0.5)
Eosinophils Absolute: 0.1 10*3/uL (ref 0.0–0.5)
HCT: 37.4 % (ref 34.8–46.6)
HCT: 37.3 % (ref 34.8–46.6)
Hemoglobin: 12.4 g/dL (ref 11.6–15.9)
Hemoglobin: 12.5 g/dL (ref 11.6–15.9)
LYMPHS ABS: 1.6 10*3/uL (ref 0.9–3.3)
LYMPHS PCT: 44 %
Lymphocytes Relative: 44 %
Lymphs Abs: 1.4 10*3/uL (ref 0.9–3.3)
MCH: 29.4 pg (ref 25.1–34.0)
MCH: 29.5 pg (ref 25.1–34.0)
MCHC: 33.2 g/dL (ref 31.5–36.0)
MCHC: 33.5 g/dL (ref 31.5–36.0)
MCV: 88.6 fL (ref 79.5–101.0)
MCV: 88 fL (ref 79.5–101.0)
MONO ABS: 0.5 10*3/uL (ref 0.1–0.9)
Monocytes Absolute: 0.4 10*3/uL (ref 0.1–0.9)
Monocytes Relative: 12 %
Monocytes Relative: 14 %
Neutro Abs: 1.3 10*3/uL — ABNORMAL LOW (ref 1.5–6.5)
Neutro Abs: 1.4 10*3/uL — ABNORMAL LOW (ref 1.5–6.5)
Neutrophils Relative %: 42 %
Neutrophils Relative %: 39 %
PLATELETS: 202 10*3/uL (ref 145–400)
PLATELETS: 220 10*3/uL (ref 145–400)
RBC: 4.22 MIL/uL (ref 3.70–5.45)
RBC: 4.24 MIL/uL (ref 3.70–5.45)
RDW: 13.8 % (ref 11.2–14.5)
RDW: 13.9 % (ref 11.2–14.5)
WBC Count: 3.7 10*3/uL — ABNORMAL LOW (ref 3.9–10.3)
WBC: 3.2 10*3/uL — AB (ref 3.9–10.3)

## 2018-01-09 LAB — CMP (CANCER CENTER ONLY)
ALBUMIN: 3.8 g/dL (ref 3.5–5.0)
ALT: 12 U/L (ref 0–44)
AST: 15 U/L (ref 15–41)
Alkaline Phosphatase: 98 U/L (ref 38–126)
Anion gap: 7 (ref 5–15)
BUN: 12 mg/dL (ref 6–20)
CHLORIDE: 103 mmol/L (ref 98–111)
CO2: 30 mmol/L (ref 22–32)
Calcium: 9 mg/dL (ref 8.9–10.3)
Creatinine: 0.72 mg/dL (ref 0.44–1.00)
GFR, Est AFR Am: >60 mL/min (ref >60)
GLUCOSE: 84 mg/dL (ref 70–99)
POTASSIUM: 3.5 mmol/L (ref 3.5–5.1)
SODIUM: 140 mmol/L (ref 135–145)
Total Bilirubin: 0.4 mg/dL (ref 0.3–1.2)
Total Protein: 7.4 g/dL (ref 6.5–8.1)

## 2018-01-09 MED ORDER — HEPARIN SOD (PORK) LOCK FLUSH 100 UNIT/ML IV SOLN
500.0000 [IU] | Freq: Once | INTRAVENOUS | Status: AC | PRN
  Administered 2018-01-09: 500 [IU]
  Filled 2018-01-09: qty 5

## 2018-01-09 MED ORDER — SODIUM CHLORIDE 0.9 % IV SOLN
2.0000 mg | Freq: Once | INTRAVENOUS | Status: AC
  Administered 2018-01-09: 2 mg via INTRAVENOUS
  Filled 2018-01-09: qty 4

## 2018-01-09 MED ORDER — ONDANSETRON HCL 4 MG/2ML IJ SOLN
INTRAMUSCULAR | Status: AC
  Filled 2018-01-09: qty 4

## 2018-01-09 MED ORDER — SODIUM CHLORIDE 0.9% FLUSH
10.0000 mL | INTRAVENOUS | Status: DC | PRN
  Administered 2018-01-09: 10 mL
  Filled 2018-01-09: qty 10

## 2018-01-09 MED ORDER — ONDANSETRON HCL 8 MG PO TABS
ORAL_TABLET | ORAL | Status: AC
  Filled 2018-01-09: qty 1

## 2018-01-09 MED ORDER — SODIUM CHLORIDE 0.9 % IV SOLN
Freq: Once | INTRAVENOUS | Status: AC
  Administered 2018-01-09: 15:00:00 via INTRAVENOUS
  Filled 2018-01-09: qty 250

## 2018-01-09 MED ORDER — ONDANSETRON HCL 4 MG/2ML IJ SOLN
8.0000 mg | Freq: Once | INTRAMUSCULAR | Status: AC
  Administered 2018-01-09: 8 mg via INTRAVENOUS

## 2018-01-09 MED ORDER — TBO-FILGRASTIM 480 MCG/0.8ML ~~LOC~~ SOSY: 480.0000 ug | PREFILLED_SYRINGE | SUBCUTANEOUS | Status: DC

## 2018-01-09 NOTE — Assessment & Plan Note (Signed)
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 Neoadj chemo 12/24/14- 04/29/15 AC x 4 foll by Abraxane X 12 Rt Lumpectomy: Path CR 0/2 LN Adj XRT 07/23/15- 09/08/15  PET/CT scan 11/17/2016:Subcutaneous nodules in the neck, upper back, left arm, abdomen and pelvis  Patient progressed on Xeloda January 2019-07/04/2017 stopped due to progression of disease  Cerebellar mass diagnosed 07/12/2016: Resection followed by stereotactic radiation 07/29/2016  Current treatment:Halaven days 1 and 8 every 3 weeks today cycle8day 1 Chemo toxicities: Severe fatigue Denies any nausea vomiting. 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  Post radiation changes in the right cerebellum  CT abdomen and pelvis 12/12/2017: Mixed response to therapy, interval decrease in the mid abdominal nodule (2.4 cm to 1.4 cm) and interval new anterior abdominal nodule (1.7 cm).  Increase in right inguinal lymph node (1.1 cm to 1.3 cm) and resolution of the left supraclavicular lymph node  Return to clinic in 3 weeks for cycle 9

## 2018-01-09 NOTE — Progress Notes (Signed)
Per Dr. Lindi Adie, okay to treat with [place] = 1300. Patient will receive Granix on Saturdays before chemo.

## 2018-01-09 NOTE — Telephone Encounter (Signed)
Gave avs and calendar ° °

## 2018-01-09 NOTE — Patient Instructions (Signed)
[facility] Discharge Instructions for Patients Receiving Chemotherapy  Today you received the following chemotherapy agents: Eribulin mesylate (Halaven)  To help prevent nausea and vomiting after your treatment, we encourage you to take your nausea medication as prescribed.    If you develop nausea and vomiting that is not controlled by your nausea medication, call the clinic.   BELOW ARE SYMPTOMS THAT SHOULD BE REPORTED IMMEDIATELY:  *FEVER GREATER THAN 100.5 F  *CHILLS WITH OR WITHOUT FEVER  NAUSEA AND VOMITING THAT IS NOT CONTROLLED WITH YOUR NAUSEA MEDICATION  *UNUSUAL SHORTNESS OF BREATH  *UNUSUAL BRUISING OR BLEEDING  TENDERNESS IN MOUTH AND THROAT WITH OR WITHOUT PRESENCE OF ULCERS  *URINARY PROBLEMS  *BOWEL PROBLEMS  UNUSUAL RASH Items with * indicate a potential emergency and should be followed up as soon as possible.  Feel free to call the clinic should you have any questions or concerns. The clinic phone number is [phone].  Please show the [place] at check-in to the Emergency Department and triage nurse.

## 2018-01-09 NOTE — Progress Notes (Signed)
Patient Care Team: [name], MD as PCP - General (Hematology and Oncology) [name], [name], [name], [name], [name], [name], NP as Nurse Practitioner (Nurse Practitioner)  DIAGNOSIS:  Encounter Diagnosis  Name Primary?  . 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, estrogen receptor negative ([place])     SUMMARY OF ONCOLOGIC HISTORY:   Breast cancer of lower-outer quadrant of right female breast ([place])   12/03/2014 Mammogram    Right breast mass 1.9 cm it o'clock position 8 cm depth from the nipple    12/03/2014 Initial Diagnosis    Right breast biopsy 8:00: Invasive ductal carcinoma, grade 3, ER 0%, PR 0%, Ki-67 90%, HER-2 negative ratio 1.43    12/10/2014 Breast MRI    Right breast lower outer quadrant: 2.3 x 2.4 x 2.4 cm rim-enhancing mass abuts the pectoralis fascia but no enhancement of pectoralis muscle, second focus of artifact?'s second tissue marker clip, no lymph nodes    12/10/2014 Clinical Stage    Stage IIA: T2 N0    12/24/2014 - 04/29/2015 Neo-Adjuvant Chemotherapy    Dose dense Adriamycin and Cytoxan 4 followed by weekly Abraxane 12    05/02/2015 Breast MRI    complete radiologic response    06/16/2015 Surgery    Left Lumpectomy: Complete path Response, 0/2 LN    06/16/2015 Pathologic Stage    ypT0 ypN0    07/23/2015 - 09/05/2015 Radiation Therapy    Adjuvant RT: 50.4 Gy in 28 fractions and a boost of 10 Gy in 5 fractions to total dose of 60.4 Gy    10/24/2015 Survivorship    SCP mailed to patient in lieu of in person visit.    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  She is tolerating chemotherapy reasonably well.  She does have fatigue but is able to function normally.  Does not have any nausea vomiting.  REVIEW OF SYSTEMS:  Constitutional: Denies fevers, chills or abnormal weight loss Eyes: Denies blurriness of vision Ears, nose, mouth, throat, and face: Denies mucositis or sore throat Respiratory: Denies cough, dyspnea or wheezes Cardiovascular: Denies palpitation, chest discomfort Gastrointestinal:  Denies nausea, heartburn or change in bowel habits Skin: Denies abnormal skin rashes Lymphatics: Denies new lymphadenopathy or easy bruising Neurological:Denies numbness, tingling or new weaknesses Behavioral/Psych: Mood is stable, no new changes    Extremities: No lower extremity edema Breast:  denies any pain or lumps or nodules in either breasts All other systems were reviewed with the patient and are negative.  I have reviewed the past medical history, past surgical history, social history and family history with the patient and they are unchanged from previous note.  ALLERGIES:  has No Known Allergies.  MEDICATIONS:  Current Outpatient Medications  Medication Sig Dispense Refill  . ALPRAZolam (XANAX) 1 MG tablet Take 2 tablets (2 mg total) by mouth at bedtime. 60 tablet 1  . cyclobenzaprine (FLEXERIL) 5 MG tablet Take 1 tablet (5 mg total) by mouth 3 (three) times daily as needed for muscle spasms. (Patient not taking: Reported on 12/20/2017) 30 tablet 0  . gabapentin (NEURONTIN) 100 MG capsule Take 1 capsule (100 mg total) by mouth at bedtime. 30 capsule 6  . HYDROcodone-acetaminophen (NORCO/VICODIN) 5-325 MG tablet Take 1-2 tablets by mouth every 6 (six) hours as needed for moderate pain or severe pain. (Patient not taking: Reported on 12/20/2017) 15 tablet 0  . lidocaine-prilocaine (EMLA) cream APPLY TO AFFECTED AREA ONCE AS DIRECTED  3  . lisinopril-hydrochlorothiazide (PRINZIDE,ZESTORETIC) 20-12.5 MG tablet TAKE 1 TABLET BY MOUTH TWICE DAILY 180 tablet 0   No current facility-administered medications for this visit.    Facility-Administered Medications Ordered in Other Visits  Medication Dose Route Frequency Provider Last Rate Last Dose  . sodium chloride flush (NS) 0.9 % injection 10 mL  10 mL Intracatheter PRN [name], MD   10 mL at 01/09/18 1550    PHYSICAL EXAMINATION: ECOG PERFORMANCE STATUS: 1 - Symptomatic but completely ambulatory  Vitals:   01/09/18 1400  BP: 118/81  Pulse: 63  Resp: 18  Temp: 98.4 F (36.9 C)  SpO2: 99%   Filed Weights   01/09/18 1400  Weight: 175 lb 9.6 oz (79.7 kg)    GENERAL:alert, no distress and comfortable SKIN: skin color, texture, turgor are normal, no rashes or  significant lesions EYES: normal, Conjunctiva are pink and non-injected, sclera clear OROPHARYNX:no exudate, no erythema and lips, buccal mucosa, and tongue normal  NECK: supple, thyroid normal size, non-tender, without nodularity LYMPH:  no palpable lymphadenopathy in the cervical, axillary or inguinal LUNGS: clear to auscultation and percussion with normal breathing effort HEART: regular rate & rhythm and no murmurs and no lower extremity edema ABDOMEN:abdomen soft, non-tender and normal bowel sounds MUSCULOSKELETAL:no cyanosis of digits and no clubbing  NEURO: alert & oriented x 3 with fluent speech, no focal motor/sensory deficits EXTREMITIES: No lower extremity edema BREAST: No palpable masses or nodules in either right or left breasts. No palpable axillary supraclavicular or infraclavicular adenopathy no breast tenderness or nipple discharge. (exam performed in the presence of a chaperone)  LABORATORY DATA:  I have reviewed the data as listed CMP Latest Ref Rng & Units 01/09/2018 12/26/2017 12/20/2017  Glucose 70 - 99 mg/dL 84 77 86  BUN 6 - 20 mg/dL '12 12 12  ' Creatinine 0.44 - 1.00 mg/dL 0.72 0.73 0.71  Sodium 135 - 145 mmol/L 140 139 140  Potassium 3.5 -  5.1 mmol/L 3.5 3.4(L) 3.8  Chloride 98 - 111 mmol/L 103 103 106  CO2 22 - 32 mmol/L '30 25 28  ' Calcium 8.9 - 10.3 mg/dL 9.0 9.3 8.9  Total Protein 6.5 - 8.1 g/dL 7.4 7.9 7.0  Total Bilirubin 0.3 - 1.2 mg/dL 0.4 0.5 0.4  Alkaline Phos 38 - 126 U/L 98 95 103  AST 15 - 41 U/L '15 16 15  ' ALT 0 - 44 U/L '12 14 10    ' Lab Results  Component Value Date   WBC 3.7 (L) 01/09/2018   HGB 12.5 01/09/2018   HCT 37.3 01/09/2018   MCV 88.0 01/09/2018   PLT 220 01/09/2018   NEUTROABS 1.4 (L) 01/09/2018    ASSESSMENT & PLAN:  Breast cancer of lower-outer quadrant of right female breast ([place]) Right breast biopsy 12/03/2014 8:00: Invasive ductal carcinoma, grade 3, ER 0%, PR 0%, Ki-67 90%, HER-2 negative ratio 1.43, 2.4 cm by MRI in 1.9 cm by  ultrasound T2 N0 M0 stage II a clinical stage abuts the pectoralis muscle no lymph nodes by MRI. Neoadj chemo 12/24/14- 04/29/15 AC x 4 foll by Abraxane X 12 Rt Lumpectomy: Path CR 0/2 LN Adj XRT 07/23/15- 09/08/15  PET/CT scan 11/17/2016:Subcutaneous nodules in the neck, upper back, left arm, abdomen and pelvis  Patient progressed on Xeloda January 2019-07/04/2017 stopped due to progression of disease  Cerebellar mass diagnosed 07/12/2016: Resection followed by stereotactic radiation 07/29/2016  Current treatment:Halaven days 1 and 8 every 3 weeks today cycle8day 1 Chemo toxicities: Severe fatigue Denies any nausea vomiting.  Neutropenia: I recommended giving Granix injections on Saturdays prior to each chemotherapy.  MRI brain 12/17/2017: No evidence of recurrent or residual metastatic disease to the brain.  Post radiation changes in the right cerebellum  CT abdomen and pelvis 12/12/2017: Mixed response to therapy, interval decrease in the mid abdominal nodule (2.4 cm to 1.4 cm) and interval new anterior abdominal nodule (1.7 cm).  Increase in right inguinal lymph node (1.1 cm to 1.3 cm) and resolution of the left supraclavicular lymph node  Return to clinic in 3 weeks for cycle 9    No orders of the defined types were placed in this encounter.  The patient has a good understanding of the overall plan. she agrees with it. she will call with any problems that may develop before the next visit here.   Harriette Ohara, MD 01/09/18

## 2018-01-14 ENCOUNTER — Inpatient Hospital Stay: Payer: BLUE CROSS/BLUE SHIELD

## 2018-01-14 VITALS — BP 129/90 | HR 70 | Temp 98.6°F | Resp 16

## 2018-01-14 DIAGNOSIS — C792 Secondary malignant neoplasm of skin: Secondary | ICD-10-CM

## 2018-01-14 DIAGNOSIS — Z5111 Encounter for antineoplastic chemotherapy: Secondary | ICD-10-CM | POA: Diagnosis not present

## 2018-01-14 MED ORDER — TBO-FILGRASTIM 480 MCG/0.8ML ~~LOC~~ SOSY
480.0000 ug | PREFILLED_SYRINGE | Freq: Once | SUBCUTANEOUS | Status: AC
  Administered 2018-01-14: 480 ug via SUBCUTANEOUS

## 2018-01-14 MED ORDER — TBO-FILGRASTIM 480 MCG/0.8ML ~~LOC~~ SOSY
PREFILLED_SYRINGE | SUBCUTANEOUS | Status: AC
  Filled 2018-01-14: qty 0.8

## 2018-01-14 NOTE — Patient Instructions (Signed)
Tbo-Filgrastim injection What is this medicine? TBO-FILGRASTIM (T B O fil GRA stim) is a granulocyte colony-stimulating factor that stimulates the growth of neutrophils, a type of white blood cell important in the body's fight against infection. It is used to reduce the incidence of fever and infection in patients with certain types of cancer who are receiving chemotherapy that affects the bone marrow. This medicine may be used for other purposes; ask your health care provider or pharmacist if you have questions. COMMON BRAND NAME(S): Granix What should I tell my health care provider before I take this medicine? They need to know if you have any of these conditions: -bone scan or tests planned -kidney disease -sickle cell anemia -an unusual or allergic reaction to tbo-filgrastim, filgrastim, pegfilgrastim, other medicines, foods, dyes, or preservatives -pregnant or trying to get pregnant -breast-feeding How should I use this medicine? This medicine is for injection under the skin. If you get this medicine at home, you will be taught how to prepare and give this medicine. Refer to the Instructions for Use that come with your medication packaging. Use exactly as directed. Take your medicine at regular intervals. Do not take your medicine more often than directed. It is important that you put your used needles and syringes in a special sharps container. Do not put them in a trash can. If you do not have a sharps container, call your pharmacist or healthcare provider to get one. Talk to your pediatrician regarding the use of this medicine in children. Special care may be needed. Overdosage: If you think you have taken too much of this medicine contact a poison control center or emergency room at once. NOTE: This medicine is only for you. Do not share this medicine with others. What if I miss a dose? It is important not to miss your dose. Call your doctor or health care professional if you miss a  dose. What may interact with this medicine? This medicine may interact with the following medications: -medicines that may cause a release of neutrophils, such as lithium This list may not describe all possible interactions. Give your health care provider a list of all the medicines, herbs, non-prescription drugs, or dietary supplements you use. Also tell them if you smoke, drink alcohol, or use illegal drugs. Some items may interact with your medicine. What should I watch for while using this medicine? You may need blood work done while you are taking this medicine. What side effects may I notice from receiving this medicine? Side effects that you should report to your doctor or health care professional as soon as possible: -allergic reactions like skin rash, itching or hives, swelling of the face, lips, or tongue -blood in the urine -dark urine -dizziness -fast heartbeat -feeling faint -shortness of breath or breathing problems -signs and symptoms of infection like fever or chills; cough; or sore throat -signs and symptoms of kidney injury like trouble passing urine or change in the amount of urine -stomach or side pain, or pain at the shoulder -sweating -swelling of the legs, ankles, or abdomen -tiredness Side effects that usually do not require medical attention (report to your doctor or health care professional if they continue or are bothersome): -bone pain -headache -muscle pain -vomiting This list may not describe all possible side effects. Call your doctor for medical advice about side effects. You may report side effects to FDA at 1-800-FDA-1088. Where should I keep my medicine? Keep out of the reach of children. Store in a refrigerator between   2 and 8 degrees C (36 and 46 degrees F). Keep in carton to protect from light. Throw away this medicine if it is left out of the refrigerator for more than 5 consecutive days. Throw away any unused medicine after the expiration  date. NOTE: This sheet is a summary. It may not cover all possible information. If you have questions about this medicine, talk to your doctor, pharmacist, or health care provider.  2018 [employer]vier/Gold Standard (2015-06-30 19:07:04)  

## 2018-01-16 ENCOUNTER — Inpatient Hospital Stay: Payer: BLUE CROSS/BLUE SHIELD

## 2018-01-16 VITALS — BP 130/89 | HR 68 | Temp 98.1°F | Resp 17

## 2018-01-16 DIAGNOSIS — Z171 Estrogen receptor negative status [ER-]: Principal | ICD-10-CM

## 2018-01-16 DIAGNOSIS — C50511 Malignant neoplasm of lower-outer quadrant of right female breast: Secondary | ICD-10-CM

## 2018-01-16 DIAGNOSIS — C792 Secondary malignant neoplasm of skin: Secondary | ICD-10-CM

## 2018-01-16 DIAGNOSIS — Z5111 Encounter for antineoplastic chemotherapy: Secondary | ICD-10-CM | POA: Diagnosis not present

## 2018-01-16 LAB — CBC WITH DIFFERENTIAL (CANCER CENTER ONLY)
BASOS PCT: 0 %
Basophils Absolute: 0 10*3/uL (ref 0.0–0.1)
Eosinophils Absolute: 0.1 10*3/uL (ref 0.0–0.5)
Eosinophils Relative: 0 %
HEMATOCRIT: 36.9 % (ref 34.8–46.6)
Hemoglobin: 12.2 g/dL (ref 11.6–15.9)
Lymphocytes Relative: 15 %
Lymphs Abs: 2.1 10*3/uL (ref 0.9–3.3)
MCH: 29.3 pg (ref 25.1–34.0)
MCHC: 33.1 g/dL (ref 31.5–36.0)
MCV: 88.5 fL (ref 79.5–101.0)
MONO ABS: 0.5 10*3/uL (ref 0.1–0.9)
MONOS PCT: 3 %
Neutro Abs: 11.8 10*3/uL — ABNORMAL HIGH (ref 1.5–6.5)
Neutrophils Relative %: 82 %
Platelet Count: 189 10*3/uL (ref 145–400)
RBC: 4.17 MIL/uL (ref 3.70–5.45)
RDW: 14.2 % (ref 11.2–14.5)
WBC Count: 14.5 10*3/uL — ABNORMAL HIGH (ref 3.9–10.3)

## 2018-01-16 LAB — CMP (CANCER CENTER ONLY)
ALBUMIN: 3.8 g/dL (ref 3.5–5.0)
ALT: 12 U/L (ref 0–44)
ANION GAP: 7 (ref 5–15)
AST: 17 U/L (ref 15–41)
Alkaline Phosphatase: 107 U/L (ref 38–126)
BILIRUBIN TOTAL: 0.4 mg/dL (ref 0.3–1.2)
BUN: 11 mg/dL (ref 6–20)
CO2: 30 mmol/L (ref 22–32)
Calcium: 9.4 mg/dL (ref 8.9–10.3)
Chloride: 104 mmol/L (ref 98–111)
Creatinine: 0.73 mg/dL (ref 0.44–1.00)
GFR, Est AFR Am: >60 mL/min (ref >60)
Glucose, Bld: 87 mg/dL (ref 70–99)
POTASSIUM: 3.3 mmol/L — AB (ref 3.5–5.1)
Sodium: 141 mmol/L (ref 135–145)
Total Protein: 7.5 g/dL (ref 6.5–8.1)

## 2018-01-16 MED ORDER — SODIUM CHLORIDE 0.9% FLUSH
10.0000 mL | INTRAVENOUS | Status: DC | PRN
  Administered 2018-01-16: 10 mL
  Filled 2018-01-16: qty 10

## 2018-01-16 MED ORDER — SODIUM CHLORIDE 0.9 % IV SOLN
Freq: Once | INTRAVENOUS | Status: AC
  Administered 2018-01-16: 15:00:00 via INTRAVENOUS
  Filled 2018-01-16: qty 250

## 2018-01-16 MED ORDER — ONDANSETRON HCL 4 MG/2ML IJ SOLN
8.0000 mg | Freq: Once | INTRAMUSCULAR | Status: AC
  Administered 2018-01-16: 8 mg via INTRAVENOUS

## 2018-01-16 MED ORDER — HEPARIN SOD (PORK) LOCK FLUSH 100 UNIT/ML IV SOLN
500.0000 [IU] | Freq: Once | INTRAVENOUS | Status: AC | PRN
  Administered 2018-01-16: 500 [IU]
  Filled 2018-01-16: qty 5

## 2018-01-16 MED ORDER — SODIUM CHLORIDE 0.9 % IV SOLN
2.0000 mg | Freq: Once | INTRAVENOUS | Status: AC
  Administered 2018-01-16: 2 mg via INTRAVENOUS
  Filled 2018-01-16: qty 4

## 2018-01-16 MED ORDER — ONDANSETRON HCL 4 MG/2ML IJ SOLN
INTRAMUSCULAR | Status: AC
  Filled 2018-01-16: qty 4

## 2018-01-16 NOTE — Patient Instructions (Signed)
[facility] Discharge Instructions for Patients Receiving Chemotherapy  Today you received the following chemotherapy agents Halaven  To help prevent nausea and vomiting after your treatment, we encourage you to take your nausea medication as directed If you develop nausea and vomiting that is not controlled by your nausea medication, call the clinic.   BELOW ARE SYMPTOMS THAT SHOULD BE REPORTED IMMEDIATELY:  *FEVER GREATER THAN 100.5 F  *CHILLS WITH OR WITHOUT FEVER  NAUSEA AND VOMITING THAT IS NOT CONTROLLED WITH YOUR NAUSEA MEDICATION  *UNUSUAL SHORTNESS OF BREATH  *UNUSUAL BRUISING OR BLEEDING  TENDERNESS IN MOUTH AND THROAT WITH OR WITHOUT PRESENCE OF ULCERS  *URINARY PROBLEMS  *BOWEL PROBLEMS  UNUSUAL RASH Items with * indicate a potential emergency and should be followed up as soon as possible.  Feel free to call the clinic should you have any questions or concerns. The clinic phone number is [phone].  Please show the CHEMO ALERT CARD at check-in to the Emergency Department and triage nurse.   

## 2018-01-17 ENCOUNTER — Telehealth: Payer: Self-pay | Admitting: Hematology and Oncology

## 2018-01-17 NOTE — Telephone Encounter (Signed)
Scheduled appt per 8/26 sch message - pt aware of appt date and time.

## 2018-01-28 ENCOUNTER — Inpatient Hospital Stay: Payer: BLUE CROSS/BLUE SHIELD | Attending: Hematology and Oncology

## 2018-01-28 VITALS — BP 106/75 | HR 82 | Temp 99.1°F | Resp 18

## 2018-01-28 DIAGNOSIS — Z23 Encounter for immunization: Secondary | ICD-10-CM | POA: Diagnosis not present

## 2018-01-28 DIAGNOSIS — Z5111 Encounter for antineoplastic chemotherapy: Secondary | ICD-10-CM | POA: Diagnosis present

## 2018-01-28 DIAGNOSIS — C50511 Malignant neoplasm of lower-outer quadrant of right female breast: Secondary | ICD-10-CM | POA: Diagnosis present

## 2018-01-28 DIAGNOSIS — Z5189 Encounter for other specified aftercare: Secondary | ICD-10-CM | POA: Insufficient documentation

## 2018-01-28 DIAGNOSIS — Z171 Estrogen receptor negative status [ER-]: Secondary | ICD-10-CM | POA: Diagnosis not present

## 2018-01-28 DIAGNOSIS — C7931 Secondary malignant neoplasm of brain: Secondary | ICD-10-CM | POA: Diagnosis present

## 2018-01-28 DIAGNOSIS — C792 Secondary malignant neoplasm of skin: Secondary | ICD-10-CM

## 2018-01-28 MED ORDER — TBO-FILGRASTIM 480 MCG/0.8ML ~~LOC~~ SOSY
PREFILLED_SYRINGE | SUBCUTANEOUS | Status: AC
  Filled 2018-01-28: qty 0.8

## 2018-01-28 MED ORDER — TBO-FILGRASTIM 480 MCG/0.8ML ~~LOC~~ SOSY
480.0000 ug | PREFILLED_SYRINGE | Freq: Once | SUBCUTANEOUS | Status: AC
  Administered 2018-01-28: 480 ug via SUBCUTANEOUS

## 2018-01-28 NOTE — Patient Instructions (Signed)
Tbo-Filgrastim injection What is this medicine? TBO-FILGRASTIM (T B O fil GRA stim) is a granulocyte colony-stimulating factor that stimulates the growth of neutrophils, a type of white blood cell important in the body's fight against infection. It is used to reduce the incidence of fever and infection in patients with certain types of cancer who are receiving chemotherapy that affects the bone marrow. This medicine may be used for other purposes; ask your health care provider or pharmacist if you have questions. COMMON BRAND NAME(S): Granix What should I tell my health care provider before I take this medicine? They need to know if you have any of these conditions: -bone scan or tests planned -kidney disease -sickle cell anemia -an unusual or allergic reaction to tbo-filgrastim, filgrastim, pegfilgrastim, other medicines, foods, dyes, or preservatives -pregnant or trying to get pregnant -breast-feeding How should I use this medicine? This medicine is for injection under the skin. If you get this medicine at home, you will be taught how to prepare and give this medicine. Refer to the Instructions for Use that come with your medication packaging. Use exactly as directed. Take your medicine at regular intervals. Do not take your medicine more often than directed. It is important that you put your used needles and syringes in a special sharps container. Do not put them in a trash can. If you do not have a sharps container, call your pharmacist or healthcare provider to get one. Talk to your pediatrician regarding the use of this medicine in children. Special care may be needed. Overdosage: If you think you have taken too much of this medicine contact a poison control center or emergency room at once. NOTE: This medicine is only for you. Do not share this medicine with others. What if I miss a dose? It is important not to miss your dose. Call your doctor or health care professional if you miss a  dose. What may interact with this medicine? This medicine may interact with the following medications: -medicines that may cause a release of neutrophils, such as lithium This list may not describe all possible interactions. Give your health care provider a list of all the medicines, herbs, non-prescription drugs, or dietary supplements you use. Also tell them if you smoke, drink alcohol, or use illegal drugs. Some items may interact with your medicine. What should I watch for while using this medicine? You may need blood work done while you are taking this medicine. What side effects may I notice from receiving this medicine? Side effects that you should report to your doctor or health care professional as soon as possible: -allergic reactions like skin rash, itching or hives, swelling of the face, lips, or tongue -blood in the urine -dark urine -dizziness -fast heartbeat -feeling faint -shortness of breath or breathing problems -signs and symptoms of infection like fever or chills; cough; or sore throat -signs and symptoms of kidney injury like trouble passing urine or change in the amount of urine -stomach or side pain, or pain at the shoulder -sweating -swelling of the legs, ankles, or abdomen -tiredness Side effects that usually do not require medical attention (report to your doctor or health care professional if they continue or are bothersome): -bone pain -headache -muscle pain -vomiting This list may not describe all possible side effects. Call your doctor for medical advice about side effects. You may report side effects to FDA at 1-800-FDA-1088. Where should I keep my medicine? Keep out of the reach of children. Store in a refrigerator between   2 and 8 degrees C (36 and 46 degrees F). Keep in carton to protect from light. Throw away this medicine if it is left out of the refrigerator for more than 5 consecutive days. Throw away any unused medicine after the expiration  date. NOTE: This sheet is a summary. It may not cover all possible information. If you have questions about this medicine, talk to your doctor, pharmacist, or health care provider.  2018 [employer]vier/Gold Standard (2015-06-30 19:07:04)  

## 2018-01-30 ENCOUNTER — Inpatient Hospital Stay (HOSPITAL_BASED_OUTPATIENT_CLINIC_OR_DEPARTMENT_OTHER): Payer: BLUE CROSS/BLUE SHIELD | Admitting: Hematology and Oncology

## 2018-01-30 ENCOUNTER — Inpatient Hospital Stay: Payer: BLUE CROSS/BLUE SHIELD

## 2018-01-30 ENCOUNTER — Telehealth: Payer: Self-pay | Admitting: Hematology and Oncology

## 2018-01-30 DIAGNOSIS — C7931 Secondary malignant neoplasm of brain: Secondary | ICD-10-CM

## 2018-01-30 DIAGNOSIS — Z171 Estrogen receptor negative status [ER-]: Secondary | ICD-10-CM

## 2018-01-30 DIAGNOSIS — C792 Secondary malignant neoplasm of skin: Secondary | ICD-10-CM

## 2018-01-30 DIAGNOSIS — Z5111 Encounter for antineoplastic chemotherapy: Secondary | ICD-10-CM | POA: Diagnosis not present

## 2018-01-30 DIAGNOSIS — C50511 Malignant neoplasm of lower-outer quadrant of right female breast: Secondary | ICD-10-CM | POA: Diagnosis not present

## 2018-01-30 LAB — CMP (CANCER CENTER ONLY)
ALBUMIN: 3.7 g/dL (ref 3.5–5.0)
ALT: 9 U/L (ref 0–44)
ANION GAP: 8 (ref 5–15)
AST: 14 U/L — ABNORMAL LOW (ref 15–41)
Alkaline Phosphatase: 117 U/L (ref 38–126)
BILIRUBIN TOTAL: 0.5 mg/dL (ref 0.3–1.2)
BUN: 9 mg/dL (ref 6–20)
CALCIUM: 9.1 mg/dL (ref 8.9–10.3)
CO2: 27 mmol/L (ref 22–32)
Chloride: 104 mmol/L (ref 98–111)
Creatinine: 0.71 mg/dL (ref 0.44–1.00)
GFR, Estimated: >60 mL/min (ref >60)
Glucose, Bld: 87 mg/dL (ref 70–99)
POTASSIUM: 3.5 mmol/L (ref 3.5–5.1)
SODIUM: 139 mmol/L (ref 135–145)
TOTAL PROTEIN: 7.3 g/dL (ref 6.5–8.1)

## 2018-01-30 LAB — CBC WITH DIFFERENTIAL (CANCER CENTER ONLY)
BASOS PCT: 0 %
Basophils Absolute: 0 10*3/uL (ref 0.0–0.1)
EOS ABS: 0.1 10*3/uL (ref 0.0–0.5)
Eosinophils Relative: 1 %
HEMATOCRIT: 36.5 % (ref 34.8–46.6)
Hemoglobin: 11.9 g/dL (ref 11.6–15.9)
LYMPHS ABS: 1.7 10*3/uL (ref 0.9–3.3)
Lymphocytes Relative: 18 %
MCH: 28.9 pg (ref 25.1–34.0)
MCHC: 32.6 g/dL (ref 31.5–36.0)
MCV: 88.6 fL (ref 79.5–101.0)
MONO ABS: 1.1 10*3/uL — AB (ref 0.1–0.9)
Monocytes Relative: 11 %
Neutro Abs: 6.7 10*3/uL — ABNORMAL HIGH (ref 1.5–6.5)
Neutrophils Relative %: 70 %
Platelet Count: 215 10*3/uL (ref 145–400)
RBC: 4.12 MIL/uL (ref 3.70–5.45)
RDW: 14.8 % — AB (ref 11.2–14.5)
WBC Count: 9.5 10*3/uL (ref 3.9–10.3)

## 2018-01-30 MED ORDER — SODIUM CHLORIDE 0.9% FLUSH
10.0000 mL | INTRAVENOUS | Status: DC | PRN
  Administered 2018-01-30: 10 mL
  Filled 2018-01-30: qty 10

## 2018-01-30 MED ORDER — SODIUM CHLORIDE 0.9 % IV SOLN
Freq: Once | INTRAVENOUS | Status: AC
  Administered 2018-01-30: 15:00:00 via INTRAVENOUS
  Filled 2018-01-30: qty 250

## 2018-01-30 MED ORDER — ONDANSETRON HCL 4 MG/2ML IJ SOLN
8.0000 mg | Freq: Once | INTRAMUSCULAR | Status: AC
  Administered 2018-01-30: 8 mg via INTRAVENOUS

## 2018-01-30 MED ORDER — ONDANSETRON HCL 4 MG/2ML IJ SOLN
INTRAMUSCULAR | Status: AC
  Filled 2018-01-30: qty 2

## 2018-01-30 MED ORDER — SODIUM CHLORIDE 0.9 % IV SOLN
2.0000 mg | Freq: Once | INTRAVENOUS | Status: AC
  Administered 2018-01-30: 2 mg via INTRAVENOUS
  Filled 2018-01-30: qty 4

## 2018-01-30 MED ORDER — HEPARIN SOD (PORK) LOCK FLUSH 100 UNIT/ML IV SOLN
500.0000 [IU] | Freq: Once | INTRAVENOUS | Status: AC | PRN
  Administered 2018-01-30: 500 [IU]
  Filled 2018-01-30: qty 5

## 2018-01-30 NOTE — Patient Instructions (Signed)
[facility] Discharge Instructions for Patients Receiving Chemotherapy  Today you received the following chemotherapy agents Halaven To help prevent nausea and vomiting after your treatment, we encourage you to take your nausea medication as prescribed.   If you develop nausea and vomiting that is not controlled by your nausea medication, call the clinic.   BELOW ARE SYMPTOMS THAT SHOULD BE REPORTED IMMEDIATELY:  *FEVER GREATER THAN 100.5 F  *CHILLS WITH OR WITHOUT FEVER  NAUSEA AND VOMITING THAT IS NOT CONTROLLED WITH YOUR NAUSEA MEDICATION  *UNUSUAL SHORTNESS OF BREATH  *UNUSUAL BRUISING OR BLEEDING  TENDERNESS IN MOUTH AND THROAT WITH OR WITHOUT PRESENCE OF ULCERS  *URINARY PROBLEMS  *BOWEL PROBLEMS  UNUSUAL RASH Items with * indicate a potential emergency and should be followed up as soon as possible.  Feel free to call the clinic should you have any questions or concerns. The clinic phone number is [phone].  Please show the CHEMO ALERT CARD at check-in to the Emergency Department and triage nurse.   

## 2018-01-30 NOTE — Assessment & Plan Note (Signed)
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 Neoadj chemo 12/24/14- 04/29/15 AC x 4 foll by Abraxane X 12 Rt Lumpectomy: Path CR 0/2 LN Adj XRT 07/23/15- 09/08/15  PET/CT scan 11/17/2016:Subcutaneous nodules in the neck, upper back, left arm, abdomen and pelvis  Patient progressed on Xeloda January 2019-07/04/2017 stopped due to progression of disease  Cerebellar mass diagnosed 07/12/2016: Resection followed by stereotactic radiation 07/29/2016  Current treatment:Halaven days 1 and 8 every 3 weeks today cycle8day 1 Chemo toxicities: Severe fatigue Denies any nausea vomiting.  Neutropenia: I recommended giving Granix injections on Saturdays prior to each chemotherapy.  MRI brain 12/17/2017: No evidence of recurrent or residual metastatic disease to the brain. Post radiation changes in the right cerebellum  CT abdomen and pelvis 12/12/2017: Mixed response to therapy, interval decrease in the mid abdominal nodule(2.4 cm to 1.4 cm)and interval new anterior abdominal nodule (1.7 cm). Increase in right inguinal lymph node (1.1 cm to 1.3 cm)and resolution of the left supraclavicular lymph node  Scans have been scheduled for 02/02/2018 Return to clinic in 3 weeks for cycle 9

## 2018-01-30 NOTE — Progress Notes (Signed)
Patient Care Team: [name], MD as PCP - General (Hematology and Oncology) [name], [name], [name], [name], [name], [name], NP as Nurse Practitioner (Nurse Practitioner)  DIAGNOSIS:  Encounter Diagnosis  Name Primary?  . 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, estrogen receptor negative ([place])     SUMMARY OF ONCOLOGIC HISTORY:   Breast cancer of lower-outer quadrant of right female breast ([place])   12/03/2014 Mammogram    Right breast mass 1.9 cm it o'clock position 8 cm depth from the nipple    12/03/2014 Initial Diagnosis    Right breast biopsy 8:00: Invasive ductal carcinoma, grade 3, ER 0%, PR 0%, Ki-67 90%, HER-2 negative ratio 1.43    12/10/2014 Breast MRI    Right breast lower outer quadrant: 2.3 x 2.4 x 2.4 cm rim-enhancing mass abuts the pectoralis fascia but no enhancement of pectoralis muscle, second focus of artifact?'s second tissue marker clip, no lymph nodes    12/10/2014 Clinical Stage    Stage IIA: T2 N0    12/24/2014 - 04/29/2015 Neo-Adjuvant Chemotherapy    Dose dense Adriamycin and Cytoxan 4 followed by weekly Abraxane 12    05/02/2015 Breast MRI    complete radiologic response    06/16/2015 Surgery    Left Lumpectomy: Complete path Response, 0/2 LN    06/16/2015 Pathologic Stage    ypT0 ypN0    07/23/2015 - 09/05/2015 Radiation Therapy    Adjuvant RT: 50.4 Gy in 28 fractions and a boost of 10 Gy in 5 fractions to total dose of 60.4 Gy    10/24/2015 Survivorship    SCP mailed to patient in lieu of in person visit.    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 identified in BARD 1, BRCA2 and NF1    12/31/2016 Miscellaneous    Everolimus 10 mg daily for cycle 1 if she cannot tolerate will decrease to 7.5 mg daily    05/24/2017 - 07/04/2017 Chemotherapy    Xeloda 2000 mg 2 weeks on 1 week off stopped due to progression of disease based on CT scans done 06/27/2017    07/25/2017 -  Chemotherapy    Halaven days 1 and 8 every 3 weeks      CHIEF COMPLIANT: Cycle 9 Halaven  INTERVAL HISTORY: Kaitlyn Keith is a 52 year old with above-mentioned history of 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  She has no significant neuropathy.  Denies any nausea vomiting.  She is tolerating chemo fairly well.  She occasionally feels slightly queasy  but it has not been bothering her lately.  She is very anxious about upcoming scans.  REVIEW OF SYSTEMS:   Constitutional: Denies fevers, chills or abnormal weight loss Eyes: Denies blurriness of vision Ears, nose, mouth, throat, and face: Denies mucositis or sore throat Respiratory: Denies cough, dyspnea or wheezes Cardiovascular: Denies palpitation, chest discomfort Gastrointestinal:  Denies nausea, heartburn or change in bowel habits Skin: Denies abnormal skin rashes Lymphatics: Denies new lymphadenopathy or easy  bruising Neurological:Denies numbness, tingling or new weaknesses Behavioral/Psych: Mood is stable, no new changes  Extremities: No lower extremity edema  All other systems were reviewed with the patient and are negative.  I have reviewed the past medical history, past surgical history, social history and family history with the patient and they are unchanged from previous note.  ALLERGIES:  has No Known Allergies.  MEDICATIONS:  Current Outpatient Medications  Medication Sig Dispense Refill  . ALPRAZolam (XANAX) 1 MG tablet Take 2 tablets (2 mg total) by mouth at bedtime. 60 tablet 1  . cyclobenzaprine (FLEXERIL) 5 MG tablet Take 1 tablet (5 mg total) by mouth 3 (three) times daily as needed for muscle spasms. (Patient not taking: Reported on 12/20/2017) 30 tablet 0  . gabapentin (NEURONTIN) 100 MG capsule Take 1 capsule (100 mg total) by mouth at bedtime. 30 capsule 6  . HYDROcodone-acetaminophen (NORCO/VICODIN) 5-325 MG tablet Take 1-2 tablets by mouth every 6 (six) hours as needed for moderate pain or severe pain. (Patient not taking: Reported on 12/20/2017) 15 tablet 0  . lidocaine-prilocaine (EMLA) cream APPLY TO AFFECTED AREA ONCE AS DIRECTED  3  . lisinopril-hydrochlorothiazide (PRINZIDE,ZESTORETIC) 20-12.5 MG tablet TAKE 1 TABLET BY MOUTH TWICE DAILY 180 tablet 0   No current facility-administered medications for this visit.    Facility-Administered Medications Ordered in Other Visits  Medication Dose Route Frequency Provider Last Rate Last Dose  . eriBULin mesylate (HALAVEN) 2 mg in sodium chloride 0.9 % 100 mL chemo infusion  2 mg Intravenous Once [name], MD      . heparin lock flush 100 unit/mL  500 Units Intracatheter Once PRN [name], MD      . sodium chloride flush (NS) 0.9 % injection 10 mL  10 mL Intracatheter PRN [name], MD        PHYSICAL EXAMINATION: ECOG PERFORMANCE STATUS: 1 - Symptomatic but completely ambulatory  Vitals:   01/30/18 1407  BP:  (!) 134/91  Pulse: 60  Resp: 17  Temp: 98.1 F (36.7 C)  SpO2: 100%   Filed Weights   01/30/18 1407  Weight: 175 lb 14.4 oz (79.8 kg)    GENERAL:alert, no distress and comfortable SKIN: skin color, texture, turgor are normal, no rashes or significant lesions EYES: normal, Conjunctiva are pink and non-injected, sclera clear OROPHARYNX:no exudate, no erythema and lips, buccal mucosa, and tongue normal  NECK: supple, thyroid normal size, non-tender, without nodularity LYMPH:  no palpable lymphadenopathy in the cervical, axillary or inguinal LUNGS: clear to auscultation and percussion with normal breathing effort HEART: regular rate & rhythm and no murmurs and no lower extremity edema ABDOMEN:abdomen soft, non-tender and normal bowel sounds MUSCULOSKELETAL:no cyanosis of digits and no clubbing  NEURO: alert & oriented x 3 with fluent speech, no focal motor/sensory deficits EXTREMITIES: No lower extremity edema   LABORATORY DATA:  I have reviewed the data as listed CMP Latest Ref Rng & Units 01/30/2018 01/16/2018 01/09/2018  Glucose 70 - 99 mg/dL 87 87 84  BUN 6 - 20 mg/dL 9  11 12  Creatinine 0.44 - 1.00 mg/dL 0.71 0.73 0.72  Sodium 135 - 145 mmol/L 139 141 140  Potassium 3.5 - 5.1 mmol/L 3.5 3.3(L) 3.5  Chloride 98 - 111 mmol/L 104 104 103  CO2 22 - 32 mmol/L '27 30 30  ' Calcium 8.9 - 10.3 mg/dL 9.1 9.4 9.0  Total Protein 6.5 - 8.1 g/dL 7.3 7.5 7.4  Total Bilirubin 0.3 - 1.2 mg/dL 0.5 0.4 0.4  Alkaline Phos 38 - 126 U/L 117 107 98  AST 15 - 41 U/L 14(L) 17 15  ALT 0 - 44 U/L '9 12 12    ' Lab Results  Component Value Date   WBC 9.5 01/30/2018   HGB 11.9 01/30/2018   HCT 36.5 01/30/2018   MCV 88.6 01/30/2018   PLT 215 01/30/2018   NEUTROABS 6.7 (H) 01/30/2018    ASSESSMENT & PLAN:  Breast cancer of lower-outer quadrant of right female breast ([place]) Right breast biopsy 12/03/2014 8:00: Invasive ductal carcinoma, grade 3, ER 0%, PR 0%, Ki-67 90%, HER-2 negative ratio 1.43, 2.4  cm by MRI in 1.9 cm by ultrasound T2 N0 M0 stage II a clinical stage abuts the pectoralis muscle no lymph nodes by MRI. Neoadj chemo 12/24/14- 04/29/15 AC x 4 foll by Abraxane X 12 Rt Lumpectomy: Path CR 0/2 LN Adj XRT 07/23/15- 09/08/15  PET/CT scan 11/17/2016:Subcutaneous nodules in the neck, upper back, left arm, abdomen and pelvis  Patient progressed on Xeloda January 2019-07/04/2017 stopped due to progression of disease  Cerebellar mass diagnosed 07/12/2016: Resection followed by stereotactic radiation 07/29/2016  Current treatment:Halaven days 1 and 8 every 3 weeks today cycle9day 1 Chemo toxicities: Severe fatigue Denies any nausea vomiting.  Neutropenia: I recommended giving Granix injections on Saturdays prior to each chemotherapy.  MRI brain 12/17/2017: No evidence of recurrent or residual metastatic disease to the brain. Post radiation changes in the right cerebellum  CT abdomen and pelvis 12/12/2017: Mixed response to therapy, interval decrease in the mid abdominal nodule(2.4 cm to 1.4 cm)and interval new anterior abdominal nodule (1.7 cm). Increase in right inguinal lymph node (1.1 cm to 1.3 cm)and resolution of the left supraclavicular lymph node  Scans have been scheduled for 02/02/2018 If he have a robust response, we may switch her chemotherapy to once every 3 weeks. Return to clinic in 3 weeks for cycle 9    No orders of the defined types were placed in this encounter.  The patient has a good understanding of the overall plan. she agrees with it. she will call with any problems that may develop before the next visit here.   Harriette Ohara, MD 01/30/18

## 2018-02-01 ENCOUNTER — Ambulatory Visit (HOSPITAL_COMMUNITY): Payer: BLUE CROSS/BLUE SHIELD

## 2018-02-02 ENCOUNTER — Ambulatory Visit (HOSPITAL_COMMUNITY)
Admission: RE | Admit: 2018-02-02 | Discharge: 2018-02-02 | Disposition: A | Discharge status: Home / Self Care | Payer: BLUE CROSS/BLUE SHIELD | Source: Ambulatory Visit | Attending: Hematology and Oncology | Admitting: Hematology and Oncology

## 2018-02-02 DIAGNOSIS — I700 Atherosclerosis of aorta: Secondary | ICD-10-CM | POA: Diagnosis not present

## 2018-02-02 DIAGNOSIS — C792 Secondary malignant neoplasm of skin: Secondary | ICD-10-CM | POA: Diagnosis not present

## 2018-02-02 DIAGNOSIS — C770 Secondary and unspecified malignant neoplasm of lymph nodes of head, face and neck: Secondary | ICD-10-CM | POA: Diagnosis not present

## 2018-02-02 DIAGNOSIS — Z853 Personal history of malignant neoplasm of breast: Secondary | ICD-10-CM | POA: Insufficient documentation

## 2018-02-02 MED ORDER — IOHEXOL 300 MG/ML  SOLN
100.0000 mL | Freq: Once | INTRAMUSCULAR | Status: AC | PRN
  Administered 2018-02-02: 100 mL via INTRAVENOUS

## 2018-02-05 NOTE — Assessment & Plan Note (Addendum)
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 Neoadj chemo 12/24/14- 04/29/15 AC x 4 foll by Abraxane X 12 Rt Lumpectomy: Path CR 0/2 LN Adj XRT 07/23/15- 09/08/15  PET/CT scan 11/17/2016:Subcutaneous nodules in the neck, upper back, left arm, abdomen and pelvis  Patient progressed on Xeloda January 2019-07/04/2017 stopped due to progression of disease  Cerebellar mass diagnosed 07/12/2016: Resection followed by stereotactic radiation 07/29/2016  Current treatment:Halaven days 1 and 8 every 3 weeks today cycle9day 1 Chemo toxicities: Severe fatigue Denies any nausea vomiting.  Neutropenia:I recommended giving Granix injections on Saturdays prior to each chemotherapy.  MRI brain 12/17/2017: No evidence of recurrent or residual metastatic disease to the brain. Post radiation changes in the right cerebellum  CT abdomen and pelvis 12/12/2017: Mixed response to therapy, interval decrease in the mid abdominal nodule(2.4 cm to 1.4 cm)and interval new anterior abdominal nodule (1.7 cm). Increase in right inguinal lymph node (1.1 cm to 1.3 cm)and resolution of the left supraclavicular lymph node  CT CAP: 02/02/2018: Met LN Left Supraclavicular nodal station stable, Rt Inguinal LN increased with Rt inguinal lymphadenopathy.  Recommend: Biopsy of the inguinal LN  Return to clinic in 2 weeks for cycle 10

## 2018-02-06 ENCOUNTER — Inpatient Hospital Stay (HOSPITAL_BASED_OUTPATIENT_CLINIC_OR_DEPARTMENT_OTHER): Payer: BLUE CROSS/BLUE SHIELD | Admitting: Hematology and Oncology

## 2018-02-06 ENCOUNTER — Inpatient Hospital Stay: Payer: BLUE CROSS/BLUE SHIELD

## 2018-02-06 DIAGNOSIS — C50511 Malignant neoplasm of lower-outer quadrant of right female breast: Secondary | ICD-10-CM

## 2018-02-06 DIAGNOSIS — C7931 Secondary malignant neoplasm of brain: Secondary | ICD-10-CM

## 2018-02-06 DIAGNOSIS — Z171 Estrogen receptor negative status [ER-]: Principal | ICD-10-CM

## 2018-02-06 DIAGNOSIS — Z23 Encounter for immunization: Secondary | ICD-10-CM

## 2018-02-06 DIAGNOSIS — Z5111 Encounter for antineoplastic chemotherapy: Secondary | ICD-10-CM | POA: Diagnosis not present

## 2018-02-06 DIAGNOSIS — C792 Secondary malignant neoplasm of skin: Secondary | ICD-10-CM

## 2018-02-06 DIAGNOSIS — R591 Generalized enlarged lymph nodes: Secondary | ICD-10-CM

## 2018-02-06 LAB — CMP (CANCER CENTER ONLY)
ALBUMIN: 4 g/dL (ref 3.5–5.0)
ALT: 10 U/L (ref 0–44)
ANION GAP: 9 (ref 5–15)
AST: 17 U/L (ref 15–41)
Alkaline Phosphatase: 98 U/L (ref 38–126)
BUN: 14 mg/dL (ref 6–20)
CHLORIDE: 102 mmol/L (ref 98–111)
CO2: 29 mmol/L (ref 22–32)
Calcium: 9.2 mg/dL (ref 8.9–10.3)
Creatinine: 0.8 mg/dL (ref 0.44–1.00)
GFR, Est AFR Am: >60 mL/min (ref >60)
GFR, Estimated: >60 mL/min (ref >60)
GLUCOSE: 81 mg/dL (ref 70–99)
Potassium: 3.3 mmol/L — ABNORMAL LOW (ref 3.5–5.1)
SODIUM: 140 mmol/L (ref 135–145)
Total Bilirubin: 0.7 mg/dL (ref 0.3–1.2)
Total Protein: 7.8 g/dL (ref 6.5–8.1)

## 2018-02-06 LAB — CBC WITH DIFFERENTIAL (CANCER CENTER ONLY)
Basophils Absolute: 0 10*3/uL (ref 0.0–0.1)
Basophils Relative: 0 %
Eosinophils Absolute: 0 10*3/uL (ref 0.0–0.5)
Eosinophils Relative: 0 %
HEMATOCRIT: 37.8 % (ref 34.8–46.6)
Hemoglobin: 12.6 g/dL (ref 11.6–15.9)
LYMPHS PCT: 30 %
Lymphs Abs: 2 10*3/uL (ref 0.9–3.3)
MCH: 29.2 pg (ref 25.1–34.0)
MCHC: 33.3 g/dL (ref 31.5–36.0)
MCV: 87.5 fL (ref 79.5–101.0)
MONO ABS: 0.3 10*3/uL (ref 0.1–0.9)
Monocytes Relative: 4 %
NEUTROS ABS: 4.2 10*3/uL (ref 1.5–6.5)
Neutrophils Relative %: 66 %
PLATELETS: 204 10*3/uL (ref 145–400)
RBC: 4.32 MIL/uL (ref 3.70–5.45)
RDW: 14.2 % (ref 11.2–14.5)
WBC Count: 6.5 10*3/uL (ref 3.9–10.3)

## 2018-02-06 MED ORDER — ONDANSETRON HCL 4 MG/2ML IJ SOLN
8.0000 mg | Freq: Once | INTRAMUSCULAR | Status: AC
  Administered 2018-02-06: 8 mg via INTRAVENOUS

## 2018-02-06 MED ORDER — INFLUENZA VAC SPLIT QUAD 0.5 ML IM SUSY
PREFILLED_SYRINGE | INTRAMUSCULAR | Status: AC
  Filled 2018-02-06: qty 0.5

## 2018-02-06 MED ORDER — SODIUM CHLORIDE 0.9% FLUSH
10.0000 mL | INTRAVENOUS | Status: DC | PRN
  Administered 2018-02-06: 10 mL
  Filled 2018-02-06: qty 10

## 2018-02-06 MED ORDER — SODIUM CHLORIDE 0.9 % IV SOLN
2.0000 mg | Freq: Once | INTRAVENOUS | Status: AC
  Administered 2018-02-06: 2 mg via INTRAVENOUS
  Filled 2018-02-06: qty 4

## 2018-02-06 MED ORDER — SODIUM CHLORIDE 0.9 % IV SOLN
Freq: Once | INTRAVENOUS | Status: AC
  Administered 2018-02-06: 15:00:00 via INTRAVENOUS
  Filled 2018-02-06: qty 250

## 2018-02-06 MED ORDER — HEPARIN SOD (PORK) LOCK FLUSH 100 UNIT/ML IV SOLN
500.0000 [IU] | Freq: Once | INTRAVENOUS | Status: AC | PRN
  Administered 2018-02-06: 500 [IU]
  Filled 2018-02-06: qty 5

## 2018-02-06 MED ORDER — ONDANSETRON HCL 4 MG/2ML IJ SOLN
INTRAMUSCULAR | Status: AC
  Filled 2018-02-06: qty 4

## 2018-02-06 MED ORDER — ALPRAZOLAM 1 MG PO TABS: 2.0000 mg | ORAL_TABLET | Freq: Every day | ORAL | 1 refills | Status: DC

## 2018-02-06 MED ORDER — INFLUENZA VAC SPLIT QUAD 0.5 ML IM SUSY
0.5000 mL | PREFILLED_SYRINGE | Freq: Once | INTRAMUSCULAR | Status: AC
  Administered 2018-02-06: 0.5 mL via INTRAMUSCULAR

## 2018-02-06 NOTE — Patient Instructions (Signed)
[facility] Discharge Instructions for Patients Receiving Chemotherapy  Today you received the following chemotherapy agents: halaven  To help prevent nausea and vomiting after your treatment, we encourage you to take your nausea medication as directed.    If you develop nausea and vomiting that is not controlled by your nausea medication, call the clinic.   BELOW ARE SYMPTOMS THAT SHOULD BE REPORTED IMMEDIATELY:  *FEVER GREATER THAN 100.5 F  *CHILLS WITH OR WITHOUT FEVER  NAUSEA AND VOMITING THAT IS NOT CONTROLLED WITH YOUR NAUSEA MEDICATION  *UNUSUAL SHORTNESS OF BREATH  *UNUSUAL BRUISING OR BLEEDING  TENDERNESS IN MOUTH AND THROAT WITH OR WITHOUT PRESENCE OF ULCERS  *URINARY PROBLEMS  *BOWEL PROBLEMS  UNUSUAL RASH Items with * indicate a potential emergency and should be followed up as soon as possible.  Feel free to call the clinic should you have any questions or concerns. The clinic phone number is [phone].  Please show the CHEMO ALERT CARD at check-in to the Emergency Department and triage nurse.   

## 2018-02-06 NOTE — Progress Notes (Signed)
Patient Care Team: [name], MD as PCP - General (Hematology and Oncology) [name], [name], [name], [name], [name], [name], NP as Nurse Practitioner (Nurse Practitioner)  DIAGNOSIS:  Encounter Diagnoses  Name Primary?  . 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, estrogen receptor negative ([place])   . Lymphadenopathy   . Breast cancer of lower-outer quadrant of right female breast ([place])     SUMMARY OF ONCOLOGIC HISTORY:   Breast cancer of lower-outer quadrant of right female breast ([place])   12/03/2014 Mammogram    Right breast mass 1.9 cm it o'clock position 8 cm depth from the nipple    12/03/2014 Initial Diagnosis    Right breast biopsy 8:00: Invasive ductal carcinoma, grade 3, ER 0%, PR 0%, Ki-67 90%, HER-2 negative ratio 1.43    12/10/2014 Breast MRI    Right breast lower outer quadrant: 2.3 x 2.4 x 2.4 cm rim-enhancing mass abuts the pectoralis fascia but no enhancement of pectoralis muscle, second focus of artifact?'s second tissue marker clip, no lymph nodes    12/10/2014 Clinical Stage    Stage IIA: T2 N0    12/24/2014 - 04/29/2015 Neo-Adjuvant Chemotherapy    Dose dense Adriamycin and Cytoxan 4 followed by weekly Abraxane 12    05/02/2015 Breast MRI    complete radiologic response    06/16/2015 Surgery    Left Lumpectomy: Complete path Response, 0/2 LN    06/16/2015 Pathologic Stage    ypT0 ypN0    07/23/2015 - 09/05/2015 Radiation Therapy    Adjuvant RT: 50.4 Gy in 28 fractions and a boost of 10 Gy in 5 fractions to total dose of 60.4 Gy    10/24/2015 Survivorship    SCP mailed to patient in lieu of in person visit.    07/26/2016 - 07/27/2016 Radiation Therapy     SRS brain    07/27/2016 - 07/29/2016  Hospital Admission    Cerebellar mass: Right suboccipital craniotomy for tumor resection with stereotactic navigation: Metastatic poorly differentiated adenocarcinoma with extensive necrosis positive for CK 7, MOC 31, CK 5/6; Neg for Er/PR, GATA-3, GCDFP CDX2, Napsin A and TTF-1    11/17/2016 PET scan    Subcutaneous nodules in the neck, upper back, left arm, abdomen and pelvis,. 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 identified in BARD 1, BRCA2 and NF1    12/31/2016 Miscellaneous    Everolimus 10 mg daily for cycle 1 if she cannot tolerate will decrease to 7.5 mg daily    05/24/2017 - 07/04/2017 Chemotherapy    Xeloda 2000 mg 2 weeks on 1 week off stopped due to progression of disease based on CT scans done 06/27/2017    07/25/2017 -  Chemotherapy    Halaven days 1 and 8 every 3 weeks      CHIEF COMPLIANT: Follow-up on CT chest abdomen pelvis  INTERVAL HISTORY: Kaitlyn Keith is a 74-year with above-mentioned history of metastatic breast cancer currently on Halaven and has completed 9 cycles of treatment.  She underwent CT chest abdomen  and pelvis and is here today to discuss results.  She denies any major side effects to Halaven.  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  Denies any nausea vomiting.  REVIEW OF SYSTEMS:   Constitutional: Denies fevers, chills or abnormal weight loss Eyes: Denies blurriness of vision Ears, nose, mouth, throat, and face: Denies mucositis or sore throat Respiratory: Denies cough, dyspnea or wheezes Cardiovascular: Denies palpitation, chest discomfort Gastrointestinal:  Denies nausea, heartburn or change in bowel habits Skin: Denies abnormal skin  rashes Lymphatics: Denies new lymphadenopathy or easy bruising Neurological: Mild neuropathy Behavioral/Psych: Mood is stable, no new changes  Extremities: No lower extremity edema  All other systems were reviewed with the patient and are negative.  I have reviewed the past medical history, past surgical history, social history and family history with the patient and they are unchanged from previous note.  ALLERGIES:  has No Known Allergies.  MEDICATIONS:  Current Outpatient Medications  Medication Sig Dispense Refill  . ALPRAZolam (XANAX) 1 MG tablet Take 2 tablets (2 mg total) by mouth at bedtime. 60 tablet 1  . cyclobenzaprine (FLEXERIL) 5 MG tablet Take 1 tablet (5 mg total) by mouth 3 (three) times daily as needed for muscle spasms. (Patient not taking: Reported on 12/20/2017) 30 tablet 0  . gabapentin (NEURONTIN) 100 MG capsule Take 1 capsule (100 mg total) by mouth at bedtime. 30 capsule 6  . HYDROcodone-acetaminophen (NORCO/VICODIN) 5-325 MG tablet Take 1-2 tablets by mouth every 6 (six) hours as needed for moderate pain or severe pain. (Patient not taking: Reported on 12/20/2017) 15 tablet 0  . lidocaine-prilocaine (EMLA) cream APPLY TO AFFECTED AREA ONCE AS DIRECTED  3  . lisinopril-hydrochlorothiazide (PRINZIDE,ZESTORETIC) 20-12.5 MG tablet TAKE 1 TABLET BY MOUTH TWICE DAILY 180 tablet 0   No current facility-administered medications for this visit.    Facility-Administered Medications Ordered in Other Visits  Medication Dose Route Frequency Provider Last Rate Last Dose  . sodium chloride flush (NS) 0.9 % injection 10 mL  10 mL Intracatheter PRN [name], MD   10 mL at 02/06/18 1631    PHYSICAL EXAMINATION: ECOG PERFORMANCE STATUS: 1 - Symptomatic but completely ambulatory  Vitals:   02/06/18 1420  BP: 130/85  Pulse: 78  Resp: 18  Temp: 98.5 F (36.9 C)  SpO2: 99%   Filed Weights   02/06/18 1420  Weight: 172 lb (78 kg)    GENERAL:alert, no distress and  comfortable SKIN: skin color, texture, turgor are normal, no rashes or significant lesions EYES: normal, Conjunctiva are pink and non-injected, sclera clear OROPHARYNX:no exudate, no erythema and lips, buccal mucosa, and tongue normal  NECK: supple, thyroid normal size, non-tender, without nodularity LYMPH: Right inguinal lymph node and left scapular subcutaneous nodule LUNGS: clear to auscultation and percussion with normal breathing effort HEART: regular rate & rhythm and no murmurs and no lower extremity edema ABDOMEN:abdomen soft, non-tender and normal bowel sounds MUSCULOSKELETAL:no cyanosis of digits and no clubbing  NEURO: alert & oriented x 3 with fluent speech, no focal motor/sensory deficits EXTREMITIES: No lower extremity edema    LABORATORY DATA:  I have reviewed the data as listed CMP Latest Ref Rng & Units 02/06/2018 01/30/2018 01/16/2018  Glucose 70 - 99 mg/dL 81 87 87  BUN 6 - 20 mg/dL '14 9 11  ' Creatinine 0.44 - 1.00 mg/dL 0.80 0.71 0.73  Sodium 135 - 145 mmol/L 140 139 141  Potassium 3.5 - 5.1 mmol/L 3.3(L) 3.5 3.3(L)  Chloride 98 - 111 mmol/L 102 104 104  CO2 22 - 32 mmol/L '29 27 30  ' Calcium 8.9 - 10.3 mg/dL 9.2 9.1 9.4  Total Protein 6.5 - 8.1 g/dL 7.8 7.3 7.5  Total Bilirubin 0.3 - 1.2 mg/dL 0.7 0.5 0.4  Alkaline Phos 38 - 126 U/L 98 117 107  AST 15 - 41 U/L 17 14(L) 17  ALT 0 - 44 U/L '10 9 12    ' Lab Results  Component Value Date   WBC 6.5 02/06/2018   HGB 12.6 02/06/2018   HCT 37.8 02/06/2018   MCV 87.5 02/06/2018   PLT 204 02/06/2018   NEUTROABS 4.2 02/06/2018    ASSESSMENT & PLAN:  Breast cancer of lower-outer quadrant of right female breast ([place]) Right breast biopsy 12/03/2014 8:00: Invasive ductal carcinoma, grade 3, ER 0%, PR 0%, 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 Neoadj chemo 12/24/14- 04/29/15 AC x 4 foll by Abraxane X 12 Rt  Lumpectomy: Path CR 0/2 LN Adj XRT 07/23/15- 09/08/15  PET/CT scan 11/17/2016:Subcutaneous nodules in the neck, upper back, left arm, abdomen and pelvis  Patient progressed on Xeloda January 2019-07/04/2017 stopped due to progression of disease  Cerebellar mass diagnosed 07/12/2016: Resection followed by stereotactic radiation 07/29/2016  Current treatment:Halaven days 1 and 8 every 3 weeks today cycle9day 1 Chemo toxicities: Severe fatigue Denies any nausea vomiting.  Neutropenia:I recommended giving Granix injections on Saturdays prior to each chemotherapy.  MRI brain 12/17/2017: No evidence of recurrent or residual metastatic disease to the brain. Post radiation changes in the right cerebellum  CT abdomen and pelvis 12/12/2017: Mixed response to therapy, interval decrease in the mid abdominal nodule(2.4 cm to 1.4 cm)and interval new anterior abdominal nodule (1.7 cm). Increase in right inguinal lymph node (1.1 cm to 1.3 cm)and resolution of the left supraclavicular lymph node  CT CAP: 02/02/2018: Met LN Left Supraclavicular nodal station stable, Rt Inguinal LN increased with Rt inguinal lymphadenopathy.  Recommend: Biopsy of the inguinal LN as well as the left scapular subcutaneous met  Return to clinic in 2 weeks for cycle 10    Orders Placed This Encounter  Procedures  . [place] FNA SOFT TISSUE    Patient has inguinal lymph nodes and Left scapular subcutaneous nodule. Please biopsy both. My cell phone is [phone]    Standing Status:   Standing    Number of Occurrences:   1    Order Specific Question:   Lab orders requested (DO NOT place separate lab orders, these will be automatically ordered during procedure specimen collection):    Answer:   Cytology - Non Pap    Comments:   Er, PR Her 2 neu    Order Specific Question:   Lab orders requested (DO NOT place separate lab orders, these will be automatically ordered during procedure specimen collection):    Answer:    Surgical Pathology    Order Specific Question:   Can the patient sign their own consent?    Answer:   Yes    Order Specific Question:   Symptom/Reason for Exam    Answer:   Lymphadenopathy [[id number]]   The patient has a good understanding of the overall plan. she agrees with it. she will call with any problems that may develop before the next visit here.   Harriette Ohara, MD 02/06/18

## 2018-02-06 NOTE — Progress Notes (Signed)
In review of Dr. Geralyn Flash previous [place] notes and pharmacy notes in treatment orders, this RN noted patient historically received Granix on Saturday prior to chemo. Asked patient about this since she did not receive on 02/04/2018. Patient states she canceled the appt because she did not want the injection.

## 2018-02-09 ENCOUNTER — Ambulatory Visit: Payer: BLUE CROSS/BLUE SHIELD | Admitting: Hematology and Oncology

## 2018-02-11 ENCOUNTER — Other Ambulatory Visit: Payer: Self-pay | Admitting: Hematology and Oncology

## 2018-02-11 DIAGNOSIS — C50511 Malignant neoplasm of lower-outer quadrant of right female breast: Secondary | ICD-10-CM

## 2018-02-16 ENCOUNTER — Other Ambulatory Visit: Payer: Self-pay

## 2018-02-16 DIAGNOSIS — C50919 Malignant neoplasm of unspecified site of unspecified female breast: Secondary | ICD-10-CM

## 2018-02-19 NOTE — Assessment & Plan Note (Signed)
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 Neoadj chemo 12/24/14- 04/29/15 AC x 4 foll by Abraxane X 12 Rt Lumpectomy: Path CR 0/2 LN Adj XRT 07/23/15- 09/08/15  PET/CT scan 11/17/2016:Subcutaneous nodules in the neck, upper back, left arm, abdomen and pelvis  Patient progressed on Xeloda January 2019-07/04/2017 stopped due to progression of disease  Cerebellar mass diagnosed 07/12/2016: Resection followed by stereotactic radiation 07/29/2016  Current treatment:Halaven days 1 and 8 every 3 weeks today cycle10day 1 Chemo toxicities: Severe fatigue Denies any nausea vomiting.  Neutropenia:I recommended giving Granix injections on Saturdays prior to each chemotherapy.  MRI brain 12/17/2017: No evidence of recurrent or residual metastatic disease to the brain. Post radiation changes in the right cerebellum  CT abdomen and pelvis 12/12/2017: Mixed response to therapy, interval decrease in the mid abdominal nodule(2.4 cm to 1.4 cm)and interval new anterior abdominal nodule (1.7 cm). Increase in right inguinal lymph node (1.1 cm to 1.3 cm)and resolution of the left supraclavicular lymph node  CT CAP: 02/02/2018: Met LN Left Supraclavicular nodal station stable, Rt Inguinal LN increased with Rt inguinal lymphadenopathy.  Recommend: Biopsy of the inguinal LN as well as the left scapular subcutaneous met  Return to clinic in 3 weeks for cycle 11

## 2018-02-20 ENCOUNTER — Other Ambulatory Visit: Payer: Self-pay | Admitting: Hematology and Oncology

## 2018-02-20 ENCOUNTER — Inpatient Hospital Stay: Payer: BLUE CROSS/BLUE SHIELD

## 2018-02-20 ENCOUNTER — Inpatient Hospital Stay (HOSPITAL_BASED_OUTPATIENT_CLINIC_OR_DEPARTMENT_OTHER): Payer: BLUE CROSS/BLUE SHIELD | Admitting: Hematology and Oncology

## 2018-02-20 DIAGNOSIS — Z171 Estrogen receptor negative status [ER-]: Secondary | ICD-10-CM | POA: Diagnosis not present

## 2018-02-20 DIAGNOSIS — C50511 Malignant neoplasm of lower-outer quadrant of right female breast: Secondary | ICD-10-CM

## 2018-02-20 DIAGNOSIS — C792 Secondary malignant neoplasm of skin: Secondary | ICD-10-CM

## 2018-02-20 DIAGNOSIS — C7931 Secondary malignant neoplasm of brain: Secondary | ICD-10-CM | POA: Diagnosis not present

## 2018-02-20 DIAGNOSIS — Z5111 Encounter for antineoplastic chemotherapy: Secondary | ICD-10-CM | POA: Diagnosis not present

## 2018-02-20 LAB — CMP (CANCER CENTER ONLY)
ALT: 10 U/L (ref 0–44)
AST: 15 U/L (ref 15–41)
Albumin: 3.8 g/dL (ref 3.5–5.0)
Alkaline Phosphatase: 104 U/L (ref 38–126)
Anion gap: 7 (ref 5–15)
BILIRUBIN TOTAL: 0.6 mg/dL (ref 0.3–1.2)
BUN: 10 mg/dL (ref 6–20)
CALCIUM: 8.9 mg/dL (ref 8.9–10.3)
CO2: 28 mmol/L (ref 22–32)
CREATININE: 0.73 mg/dL (ref 0.44–1.00)
Chloride: 106 mmol/L (ref 98–111)
Glucose, Bld: 91 mg/dL (ref 70–99)
Potassium: 3.1 mmol/L — ABNORMAL LOW (ref 3.5–5.1)
Sodium: 141 mmol/L (ref 135–145)
TOTAL PROTEIN: 7.5 g/dL (ref 6.5–8.1)

## 2018-02-20 LAB — CBC WITH DIFFERENTIAL (CANCER CENTER ONLY)
BASOS ABS: 0 10*3/uL (ref 0.0–0.1)
BASOS PCT: 1 %
EOS ABS: 0.1 10*3/uL (ref 0.0–0.5)
Eosinophils Relative: 1 %
HCT: 36.3 % (ref 34.8–46.6)
Hemoglobin: 12.1 g/dL (ref 11.6–15.9)
Lymphocytes Relative: 34 %
Lymphs Abs: 1.4 10*3/uL (ref 0.9–3.3)
MCH: 28.6 pg (ref 25.1–34.0)
MCHC: 33.3 g/dL (ref 31.5–36.0)
MCV: 85.8 fL (ref 79.5–101.0)
Monocytes Absolute: 0.6 10*3/uL (ref 0.1–0.9)
Monocytes Relative: 15 %
NEUTROS PCT: 49 %
Neutro Abs: 2 10*3/uL (ref 1.5–6.5)
PLATELETS: 229 10*3/uL (ref 145–400)
RBC: 4.24 MIL/uL (ref 3.70–5.45)
RDW: 15.5 % — ABNORMAL HIGH (ref 11.2–14.5)
WBC: 4.1 10*3/uL (ref 3.9–10.3)

## 2018-02-20 MED ORDER — ONDANSETRON HCL 4 MG/2ML IJ SOLN
8.0000 mg | Freq: Once | INTRAMUSCULAR | Status: AC
  Administered 2018-02-20: 8 mg via INTRAVENOUS

## 2018-02-20 MED ORDER — SODIUM CHLORIDE 0.9% FLUSH
10.0000 mL | INTRAVENOUS | Status: DC | PRN
  Administered 2018-02-20: 10 mL
  Filled 2018-02-20: qty 10

## 2018-02-20 MED ORDER — HEPARIN SOD (PORK) LOCK FLUSH 100 UNIT/ML IV SOLN
500.0000 [IU] | Freq: Once | INTRAVENOUS | Status: AC | PRN
  Administered 2018-02-20: 500 [IU]
  Filled 2018-02-20: qty 5

## 2018-02-20 MED ORDER — SODIUM CHLORIDE 0.9 % IV SOLN
2.0000 mg | Freq: Once | INTRAVENOUS | Status: AC
  Administered 2018-02-20: 2 mg via INTRAVENOUS
  Filled 2018-02-20: qty 4

## 2018-02-20 MED ORDER — SODIUM CHLORIDE 0.9 % IV SOLN
Freq: Once | INTRAVENOUS | Status: AC
  Administered 2018-02-20: 15:00:00 via INTRAVENOUS
  Filled 2018-02-20: qty 250

## 2018-02-20 MED ORDER — ONDANSETRON HCL 8 MG PO TABS
ORAL_TABLET | ORAL | Status: AC
  Filled 2018-02-20: qty 1

## 2018-02-20 MED ORDER — ONDANSETRON HCL 4 MG/2ML IJ SOLN
INTRAMUSCULAR | Status: AC
  Filled 2018-02-20: qty 4

## 2018-02-20 NOTE — Progress Notes (Signed)
 Patient Care Team: Gudena, Vinay, MD as PCP - General (Hematology and Oncology) Toth, Paul III, MD as Consulting Physician (General Surgery) Gudena, Vinay, MD as Consulting Physician (Hematology and Oncology) Kinard, James, MD as Consulting Physician (Radiation Oncology) Stuart, Dawn C, RN as Registered Nurse Martini, Keisha N, RN as Registered Nurse Dawson, Gretchen W, NP as Nurse Practitioner (Nurse Practitioner)  DIAGNOSIS:  Encounter Diagnosis  Name Primary?  . 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, estrogen receptor negative (HCC)     SUMMARY OF ONCOLOGIC HISTORY:   Breast cancer of lower-outer quadrant of right female breast (HCC)   12/03/2014 Mammogram    Right breast mass 1.9 cm it o'clock position 8 cm depth from the nipple    12/03/2014 Initial Diagnosis    Right breast biopsy 8:00: Invasive ductal carcinoma, grade 3, ER 0%, PR 0%, Ki-67 90%, HER-2 negative ratio 1.43    12/10/2014 Breast MRI    Right breast lower outer quadrant: 2.3 x 2.4 x 2.4 cm rim-enhancing mass abuts the pectoralis fascia but no enhancement of pectoralis muscle, second focus of artifact?'s second tissue marker clip, no lymph nodes    12/10/2014 Clinical Stage    Stage IIA: T2 N0    12/24/2014 - 04/29/2015 Neo-Adjuvant Chemotherapy    Dose dense Adriamycin and Cytoxan 4 followed by weekly Abraxane 12    05/02/2015 Breast MRI    complete radiologic response    06/16/2015 Surgery    Left Lumpectomy: Complete path Response, 0/2 LN    06/16/2015 Pathologic Stage    ypT0 ypN0    07/23/2015 - 09/05/2015 Radiation Therapy    Adjuvant RT: 50.4 Gy in 28 fractions and a boost of 10 Gy in 5 fractions to total dose of 60.4 Gy    10/24/2015 Survivorship    SCP mailed to patient in lieu of in person visit.    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  She appears to be tolerating it extremely well.  She does not have any nausea vomiting.  She continues to have moderate fatigue.  She denies neuropathy.    REVIEW OF SYSTEMS:   Constitutional: Denies fevers, chills or abnormal weight loss Eyes: Denies blurriness of vision Ears, nose, mouth, throat, and face: Denies mucositis or sore throat Respiratory: Denies cough, dyspnea or wheezes Cardiovascular: Denies palpitation, chest discomfort Gastrointestinal:  Denies nausea, heartburn or change in bowel habits Skin: Denies abnormal skin rashes Lymphatics: Denies new lymphadenopathy or easy bruising Neurological:Denies numbness, tingling or new weaknesses Behavioral/Psych: Mood is stable, no new changes    Extremities: No lower extremity edema  All other systems were reviewed with the patient and are negative.  I have reviewed the past medical history, past surgical history, social history and family history with the patient and they are unchanged from previous note.  ALLERGIES:  has No Known Allergies.  MEDICATIONS:  Current Outpatient Medications  Medication Sig Dispense Refill  . ALPRAZolam (XANAX) 1 MG tablet Take 2 tablets (2 mg total) by mouth at bedtime. 60 tablet 1  . cyclobenzaprine (FLEXERIL) 5 MG tablet Take 1 tablet (5 mg total) by mouth 3 (three) times daily as needed for muscle spasms. (Patient not taking: Reported on 12/20/2017) 30 tablet 0  . gabapentin (NEURONTIN) 100 MG capsule Take 1 capsule (100 mg total) by mouth at bedtime. 30 capsule 6  . HYDROcodone-acetaminophen (NORCO/VICODIN) 5-325 MG tablet Take 1-2 tablets by mouth every 6 (six) hours as needed for moderate pain or severe pain. (Patient not taking: Reported on 12/20/2017) 15 tablet 0  . lidocaine-prilocaine (EMLA) cream APPLY TO AFFECTED AREA ONCE AS DIRECTED  3  . lisinopril-hydrochlorothiazide (PRINZIDE,ZESTORETIC) 20-12.5 MG tablet TAKE 1 TABLET BY MOUTH TWICE DAILY 180 tablet 0  . lisinopril-hydrochlorothiazide (PRINZIDE,ZESTORETIC) 20-12.5 MG tablet TAKE 1 TABLET BY MOUTH TWICE DAILY 180 tablet 0   No current facility-administered medications for this visit.     PHYSICAL EXAMINATION: ECOG PERFORMANCE STATUS: 1 - Symptomatic but completely ambulatory  Vitals:   02/20/18 1422  BP: 130/82  Pulse: 63  Resp: 18  Temp: 98 F (36.7 C)  SpO2: 100%   Filed Weights   02/20/18 1422  Weight: 174 lb (78.9 kg)    GENERAL:alert, no distress and comfortable SKIN: skin color, texture, turgor are normal, no rashes or significant lesions EYES: normal, Conjunctiva are pink and non-injected, sclera clear OROPHARYNX:no exudate, no erythema and lips, buccal mucosa, and tongue normal  NECK: supple, thyroid normal  size, non-tender, without nodularity LYMPH:  no palpable lymphadenopathy in the cervical, axillary or inguinal LUNGS: clear to auscultation and percussion with normal breathing effort HEART: regular rate & rhythm and no murmurs and no lower extremity edema ABDOMEN:abdomen soft, non-tender and normal bowel sounds MUSCULOSKELETAL:no cyanosis of digits and no clubbing  NEURO: alert & oriented x 3 with fluent speech, no focal motor/sensory deficits EXTREMITIES: No lower extremity edema   LABORATORY DATA:  I have reviewed the data as listed CMP Latest Ref Rng & Units 02/06/2018 01/30/2018 01/16/2018  Glucose 70 - 99 mg/dL 81 87 87  BUN 6 - 20 mg/dL _0 Creatinine 0.44 - 1.00 mg/dL 0.80 0.71 0.73  Sodium 135 - 145 mmol/L 140 139 141  Potassium 3.5 - 5.1 mmol/L 3.3(L) 3.5 3.3(L)  Chloride 98 - 111 mmol/L 102 104 104  CO2 22 - 32 mmol/L _1 Calcium 8.9 - 10.3 mg/dL 9.2 9.1 9.4  Total Protein 6.5 - 8.1 g/dL 7.8 7.3 7.5  Total Bilirubin 0.3 - 1.2 mg/dL 0.7 0.5 0.4  Alkaline Phos 38 - 126 U/L 98 117 107  AST 15 - 41  U/L 17 14(L) 17  ALT 0 - 44 U/L _0 Lab Results  Component Value Date   WBC 6.5 02/06/2018   HGB 12.6 02/06/2018   HCT 37.8 02/06/2018   MCV 87.5 02/06/2018   PLT 204 02/06/2018   NEUTROABS 4.2 02/06/2018    ASSESSMENT & PLAN:  Breast cancer of lower-outer quadrant of right female breast ([place]) Right breast biopsy 12/03/2014 8:00: Invasive ductal carcinoma, grade 3, ER 0%, PR 0%, 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 Neoadj chemo 12/24/14- 04/29/15 AC x 4 foll by Abraxane X 12 Rt Lumpectomy: Path CR 0/2 LN Adj XRT 07/23/15- 09/08/15  PET/CT scan 11/17/2016:Subcutaneous nodules in the neck, upper back, left arm, abdomen and pelvis  Patient progressed on Xeloda January 2019-07/04/2017 stopped due to progression of disease  Cerebellar mass  diagnosed 07/12/2016: Resection followed by stereotactic radiation 07/29/2016  Current treatment:Halaven days 1 and 8 every 3 weeks today cycle10day 1 Chemo toxicities: Severe fatigue Denies any nausea vomiting.  Neutropenia:I recommended giving Granix injections on Saturdays prior to each chemotherapy.  MRI brain 12/17/2017: No evidence of recurrent or residual metastatic disease to the brain. Post radiation changes in the right cerebellum  CT abdomen and pelvis 12/12/2017: Mixed response to therapy, interval decrease in the mid abdominal nodule(2.4 cm to 1.4 cm)and interval new anterior abdominal nodule (1.7 cm). Increase in right inguinal lymph node (1.1 cm to 1.3 cm)and resolution of the left supraclavicular lymph node  CT CAP: 02/02/2018: Met LN Left Supraclavicular nodal station stable, Rt Inguinal LN increased with Rt inguinal lymphadenopathy.  Recommend: Biopsy of the inguinal LN  has been scheduled I will call her with results of this test. Return to clinic in 3 weeks for cycle 11     No orders of the defined types were placed in this encounter.  The patient has a good understanding of the overall plan. she agrees with it. she will call with any problems that may develop before the next visit here.   Harriette Ohara, MD 02/20/18

## 2018-02-20 NOTE — Patient Instructions (Signed)
[facility] Discharge Instructions for Patients Receiving Chemotherapy  Today you received the following chemotherapy agents Halaven  To help prevent nausea and vomiting after your treatment, we encourage you to take your nausea medication as directed If you develop nausea and vomiting that is not controlled by your nausea medication, call the clinic.   BELOW ARE SYMPTOMS THAT SHOULD BE REPORTED IMMEDIATELY:  *FEVER GREATER THAN 100.5 F  *CHILLS WITH OR WITHOUT FEVER  NAUSEA AND VOMITING THAT IS NOT CONTROLLED WITH YOUR NAUSEA MEDICATION  *UNUSUAL SHORTNESS OF BREATH  *UNUSUAL BRUISING OR BLEEDING  TENDERNESS IN MOUTH AND THROAT WITH OR WITHOUT PRESENCE OF ULCERS  *URINARY PROBLEMS  *BOWEL PROBLEMS  UNUSUAL RASH Items with * indicate a potential emergency and should be followed up as soon as possible.  Feel free to call the clinic should you have any questions or concerns. The clinic phone number is [phone].  Please show the CHEMO ALERT CARD at check-in to the Emergency Department and triage nurse.   

## 2018-02-27 ENCOUNTER — Inpatient Hospital Stay: Payer: BLUE CROSS/BLUE SHIELD

## 2018-02-27 ENCOUNTER — Inpatient Hospital Stay: Payer: BLUE CROSS/BLUE SHIELD | Attending: Hematology and Oncology

## 2018-02-27 VITALS — BP 131/97 | HR 65 | Temp 97.5°F | Resp 18

## 2018-02-27 DIAGNOSIS — C50511 Malignant neoplasm of lower-outer quadrant of right female breast: Secondary | ICD-10-CM | POA: Diagnosis present

## 2018-02-27 DIAGNOSIS — C7931 Secondary malignant neoplasm of brain: Secondary | ICD-10-CM | POA: Diagnosis present

## 2018-02-27 DIAGNOSIS — Z171 Estrogen receptor negative status [ER-]: Secondary | ICD-10-CM | POA: Insufficient documentation

## 2018-02-27 DIAGNOSIS — Z5111 Encounter for antineoplastic chemotherapy: Secondary | ICD-10-CM | POA: Diagnosis present

## 2018-02-27 DIAGNOSIS — C792 Secondary malignant neoplasm of skin: Secondary | ICD-10-CM

## 2018-02-27 LAB — CMP (CANCER CENTER ONLY)
ALBUMIN: 3.8 g/dL (ref 3.5–5.0)
ALK PHOS: 100 U/L (ref 38–126)
ALT: 12 U/L (ref 0–44)
AST: 17 U/L (ref 15–41)
Anion gap: 9 (ref 5–15)
BUN: 15 mg/dL (ref 6–20)
CALCIUM: 9.4 mg/dL (ref 8.9–10.3)
CO2: 27 mmol/L (ref 22–32)
CREATININE: 0.7 mg/dL (ref 0.44–1.00)
Chloride: 105 mmol/L (ref 98–111)
GFR, Est AFR Am: >60 mL/min (ref >60)
GFR, Estimated: >60 mL/min (ref >60)
Glucose, Bld: 84 mg/dL (ref 70–99)
Potassium: 3.4 mmol/L — ABNORMAL LOW (ref 3.5–5.1)
SODIUM: 141 mmol/L (ref 135–145)
Total Bilirubin: 0.6 mg/dL (ref 0.3–1.2)
Total Protein: 7.7 g/dL (ref 6.5–8.1)

## 2018-02-27 LAB — CBC WITH DIFFERENTIAL (CANCER CENTER ONLY)
Basophils Absolute: 0 10*3/uL (ref 0.0–0.1)
Basophils Relative: 1 %
EOS ABS: 0 10*3/uL (ref 0.0–0.5)
Eosinophils Relative: 0 %
HCT: 36.5 % (ref 34.8–46.6)
HEMOGLOBIN: 12.3 g/dL (ref 11.6–15.9)
Lymphocytes Relative: 24 %
Lymphs Abs: 1.6 10*3/uL (ref 0.9–3.3)
MCH: 29 pg (ref 25.1–34.0)
MCHC: 33.7 g/dL (ref 31.5–36.0)
MCV: 86 fL (ref 79.5–101.0)
MONOS PCT: 4 %
Monocytes Absolute: 0.2 10*3/uL (ref 0.1–0.9)
NEUTROS PCT: 71 %
Neutro Abs: 4.5 10*3/uL (ref 1.5–6.5)
Platelet Count: 218 10*3/uL (ref 145–400)
RBC: 4.25 MIL/uL (ref 3.70–5.45)
RDW: 15.6 % — ABNORMAL HIGH (ref 11.2–14.5)
WBC: 6.4 10*3/uL (ref 3.9–10.3)

## 2018-02-27 MED ORDER — SODIUM CHLORIDE 0.9% FLUSH
10.0000 mL | INTRAVENOUS | Status: DC | PRN
  Administered 2018-02-27: 10 mL
  Filled 2018-02-27: qty 10

## 2018-02-27 MED ORDER — SODIUM CHLORIDE 0.9 % IV SOLN
Freq: Once | INTRAVENOUS | Status: AC
  Administered 2018-02-27: 15:00:00 via INTRAVENOUS
  Filled 2018-02-27: qty 250

## 2018-02-27 MED ORDER — HEPARIN SOD (PORK) LOCK FLUSH 100 UNIT/ML IV SOLN
500.0000 [IU] | Freq: Once | INTRAVENOUS | Status: AC | PRN
  Administered 2018-02-27: 500 [IU]
  Filled 2018-02-27: qty 5

## 2018-02-27 MED ORDER — SODIUM CHLORIDE 0.9 % IV SOLN
2.0000 mg | Freq: Once | INTRAVENOUS | Status: AC
  Administered 2018-02-27: 2 mg via INTRAVENOUS
  Filled 2018-02-27: qty 4

## 2018-02-27 MED ORDER — ONDANSETRON HCL 4 MG/2ML IJ SOLN
INTRAMUSCULAR | Status: AC
  Filled 2018-02-27: qty 4

## 2018-02-27 MED ORDER — ONDANSETRON HCL 4 MG/2ML IJ SOLN
8.0000 mg | Freq: Once | INTRAMUSCULAR | Status: AC
  Administered 2018-02-27: 8 mg via INTRAVENOUS

## 2018-02-27 NOTE — Patient Instructions (Signed)
[facility] Discharge Instructions for Patients Receiving Chemotherapy  Today you received the following chemotherapy agents Halaven  To help prevent nausea and vomiting after your treatment, we encourage you to take your nausea medication as directed If you develop nausea and vomiting that is not controlled by your nausea medication, call the clinic.   BELOW ARE SYMPTOMS THAT SHOULD BE REPORTED IMMEDIATELY:  *FEVER GREATER THAN 100.5 F  *CHILLS WITH OR WITHOUT FEVER  NAUSEA AND VOMITING THAT IS NOT CONTROLLED WITH YOUR NAUSEA MEDICATION  *UNUSUAL SHORTNESS OF BREATH  *UNUSUAL BRUISING OR BLEEDING  TENDERNESS IN MOUTH AND THROAT WITH OR WITHOUT PRESENCE OF ULCERS  *URINARY PROBLEMS  *BOWEL PROBLEMS  UNUSUAL RASH Items with * indicate a potential emergency and should be followed up as soon as possible.  Feel free to call the clinic should you have any questions or concerns. The clinic phone number is [phone].  Please show the CHEMO ALERT CARD at check-in to the Emergency Department and triage nurse.   

## 2018-03-01 ENCOUNTER — Other Ambulatory Visit: Payer: Self-pay | Admitting: Physician Assistant

## 2018-03-02 ENCOUNTER — Ambulatory Visit (HOSPITAL_COMMUNITY)
Admission: RE | Admit: 2018-03-02 | Discharge: 2018-03-02 | Disposition: A | Discharge status: Home / Self Care | Payer: BLUE CROSS/BLUE SHIELD | Source: Ambulatory Visit | Attending: Hematology and Oncology | Admitting: Hematology and Oncology

## 2018-03-02 ENCOUNTER — Encounter (HOSPITAL_COMMUNITY): Payer: Self-pay

## 2018-03-02 ENCOUNTER — Other Ambulatory Visit (HOSPITAL_COMMUNITY)
Admission: RE | Admit: 2018-03-02 | Discharge: 2018-03-02 | Disposition: A | Discharge status: Home / Self Care | Payer: BLUE CROSS/BLUE SHIELD | Source: Ambulatory Visit | Attending: Hematology and Oncology | Admitting: Hematology and Oncology

## 2018-03-02 DIAGNOSIS — I700 Atherosclerosis of aorta: Secondary | ICD-10-CM | POA: Insufficient documentation

## 2018-03-02 DIAGNOSIS — G620 Drug-induced polyneuropathy: Secondary | ICD-10-CM | POA: Insufficient documentation

## 2018-03-02 DIAGNOSIS — F419 Anxiety disorder, unspecified: Secondary | ICD-10-CM | POA: Insufficient documentation

## 2018-03-02 DIAGNOSIS — Z1509 Genetic susceptibility to other malignant neoplasm: Secondary | ICD-10-CM | POA: Insufficient documentation

## 2018-03-02 DIAGNOSIS — C7931 Secondary malignant neoplasm of brain: Secondary | ICD-10-CM | POA: Insufficient documentation

## 2018-03-02 DIAGNOSIS — C50919 Malignant neoplasm of unspecified site of unspecified female breast: Secondary | ICD-10-CM | POA: Insufficient documentation

## 2018-03-02 DIAGNOSIS — I10 Essential (primary) hypertension: Secondary | ICD-10-CM | POA: Diagnosis not present

## 2018-03-02 DIAGNOSIS — R51 Headache: Secondary | ICD-10-CM | POA: Diagnosis not present

## 2018-03-02 DIAGNOSIS — F329 Major depressive disorder, single episode, unspecified: Secondary | ICD-10-CM | POA: Diagnosis not present

## 2018-03-02 DIAGNOSIS — K7689 Other specified diseases of liver: Secondary | ICD-10-CM | POA: Diagnosis not present

## 2018-03-02 DIAGNOSIS — Z79899 Other long term (current) drug therapy: Secondary | ICD-10-CM | POA: Insufficient documentation

## 2018-03-02 DIAGNOSIS — Z853 Personal history of malignant neoplasm of breast: Secondary | ICD-10-CM | POA: Insufficient documentation

## 2018-03-02 DIAGNOSIS — R590 Localized enlarged lymph nodes: Secondary | ICD-10-CM | POA: Diagnosis present

## 2018-03-02 DIAGNOSIS — C774 Secondary and unspecified malignant neoplasm of inguinal and lower limb lymph nodes: Secondary | ICD-10-CM | POA: Insufficient documentation

## 2018-03-02 DIAGNOSIS — Z9221 Personal history of antineoplastic chemotherapy: Secondary | ICD-10-CM | POA: Diagnosis not present

## 2018-03-02 DIAGNOSIS — C770 Secondary and unspecified malignant neoplasm of lymph nodes of head, face and neck: Secondary | ICD-10-CM | POA: Diagnosis not present

## 2018-03-02 DIAGNOSIS — D571 Sickle-cell disease without crisis: Secondary | ICD-10-CM | POA: Insufficient documentation

## 2018-03-02 DIAGNOSIS — Z8249 Family history of ischemic heart disease and other diseases of the circulatory system: Secondary | ICD-10-CM | POA: Insufficient documentation

## 2018-03-02 DIAGNOSIS — Z923 Personal history of irradiation: Secondary | ICD-10-CM | POA: Diagnosis not present

## 2018-03-02 LAB — PROTIME-INR
INR: 0.9
PROTHROMBIN TIME: 12 s (ref 11.4–15.2)

## 2018-03-02 LAB — APTT: aPTT: 28 seconds (ref 24–36)

## 2018-03-02 LAB — CBC
HEMATOCRIT: 38.9 % (ref 36.0–46.0)
HEMOGLOBIN: 12.4 g/dL (ref 12.0–15.0)
MCH: 28.1 pg (ref 26.0–34.0)
MCHC: 31.9 g/dL (ref 30.0–36.0)
MCV: 88.2 fL (ref 80.0–100.0)
Platelets: 192 10*3/uL (ref 150–400)
RBC: 4.41 MIL/uL (ref 3.87–5.11)
RDW: 14.1 % (ref 11.5–15.5)
WBC: 3 10*3/uL — AB (ref 4.0–10.5)
nRBC: 0 % (ref 0.0–0.2)

## 2018-03-02 MED ORDER — FENTANYL CITRATE (PF) 100 MCG/2ML IJ SOLN
INTRAMUSCULAR | Status: AC | PRN
  Administered 2018-03-02 (×2): 50 ug via INTRAVENOUS

## 2018-03-02 MED ORDER — MIDAZOLAM HCL 2 MG/2ML IJ SOLN
INTRAMUSCULAR | Status: AC
  Filled 2018-03-02: qty 2

## 2018-03-02 MED ORDER — MIDAZOLAM HCL 2 MG/2ML IJ SOLN
INTRAMUSCULAR | Status: AC | PRN
  Administered 2018-03-02 (×2): 1 mg via INTRAVENOUS

## 2018-03-02 MED ORDER — LIDOCAINE HCL 1 % IJ SOLN
INTRAMUSCULAR | Status: AC
  Filled 2018-03-02: qty 20

## 2018-03-02 MED ORDER — FENTANYL CITRATE (PF) 100 MCG/2ML IJ SOLN
INTRAMUSCULAR | Status: AC
  Filled 2018-03-02: qty 2

## 2018-03-02 MED ORDER — SODIUM CHLORIDE 0.9 % IV SOLN
INTRAVENOUS | Status: DC
  Administered 2018-03-02: 12:00:00 via INTRAVENOUS

## 2018-03-02 MED ORDER — LIDOCAINE HCL (PF) 1 % IJ SOLN
INTRAMUSCULAR | Status: AC | PRN
  Administered 2018-03-02: 10 mL via INTRADERMAL

## 2018-03-02 NOTE — Procedures (Signed)
Right inguinal lymph node biopsy 18 g core times three EBL 0 Comp 0

## 2018-03-02 NOTE — H&P (Signed)
Chief Complaint: Patient was seen in consultation today for metastatic breast cancer.  Referring Physician(s): [name]  Supervising Physician: Marybelle Killings  Patient Status: [facility] - Out-pt  History of Present Illness: Kaitlyn Keith is a 52 y.o. female with a past medical history of hypertension, headaches, renal insufficiency, MSH6-related Lynch syndrome, breast cancer, sickle cell anemia, back pain, arthritis, chemotherapy induced neuropathy of bilateral hands, depression, and anxiety. Unfortunately, she was diagnosed with invasive ductal carcinoma of the right breast in 11/2014. Her cancer has been managed by Dr. Lindi Adie since. She underwent neo-adjuvant chemotherapy 12/2014-04/2015 followed by radiation therapy 07/2016. 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 She has undergone chemotherapy again 05/2017-06/2017 and 07/2017-current. She recently underwent CT abdomen/pelvis 12/12/2017 which revealed possible metastasis.  CT abdomen/pelvis 12/12/2017: 1. Mixed response to therapy. 2. Interval decrease in size of lower mid abdominal nodule with interval increase in size/development of new anterior abdominal nodule. 3. Interval increase in size of right inguinal lymph node. 4. Suspect interval decrease in size of left supraclavicular lymph node versus non visualization. 5. Similar subcutaneous nodule anterior to the left pubic bone. 6. Similar-appearing soft tissue anterior mediastinum.  CT abdomen/pelvis 02/02/2018: 1. Metastatic lymphadenopathy in the left supraclavicular nodal station appears stable to the prior examinations. Metastatic lesion in the subcutaneous fat of the right inguinal region has grown compared to the prior study, now with some associated right inguinal lymphadenopathy. 2. No other definite sites of metastatic disease noted elsewhere in the chest, abdomen or pelvis. 3. Aortic atherosclerosis. 4. Additional incidental findings, as above,  similar to the prior study.  IR requested by Dr. Lindi Adie for possible image-guided right inguinal lymph node biopsy. Patient awake and alert sitting in bed with no complaints at this time. Denis fever, chills, chest pain, dyspnea, abdominal pain, N/V, dizziness, or headache.   Past Medical History:  Diagnosis Date  . Anxiety   . Arthritis   . Back pain   . 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 Breast cancer ([place])   . Breast cancer of lower-outer quadrant of right female breast ([place]) 12/05/2014  . Depression   . FH: chemotherapy 12/2014-04/2015  . Genetic testing 12/10/2016   Kaitlyn Keith underwent genetic counseling and testing for hereditary cancer syndromes on 11/18/2016. 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 Mutations in MSH6 are associated with a hereditary cancer syndrome called Lynch syndrome. For more detailed discussion, please see genetic counseling documentation from 12/10/2016.  Testing was perfo  . Headache    due to brain cancer, no longer having them  . Hot flashes   . Hypertension   . 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 Mutations in MSH6 are associated with a hereditary cancer syndrome called Lynch syndrome. For more detailed discussion, please see genetic counseling documentation from 12/10/2016.  Testing was perfo  . Neuromuscular disorder ([place])    neuropathy in hands due to chemo  . Radiation 07/23/15-09/05/15   right breast 50.4 Gy, boost of 10 Gy    Past Surgical History:  Procedure Laterality Date  . APPLICATION OF CRANIAL NAVIGATION Right 07/27/2016   Procedure: APPLICATION OF CRANIAL NAVIGATION;  Surgeon: Kevan Ny Ditty, MD;  Location: [place];  Service:  Neurosurgery;  Laterality: Right;  . BIOPSY  OF SKIN SUBCUTANEOUS TISSUE AND/OR MUCOUS MEMBRANE Left 12/24/2016   Procedure: OPEN BIOPSY LESIONS ON LEFT LOWER BACK AND SHOULDER BLADE;  Surgeon: [name], MD;  Location: [place];  Service: General;  Laterality: Left;  . BREAST LUMPECTOMY WITH NEEDLE LOCALIZATION AND AXILLARY SENTINEL LYMPH NODE BX Right 06/16/2015   Procedure: BREAST LUMPECTOMY WITH NEEDLE LOCALIZATION AND AXILLARY SENTINEL LYMPH NODE BX;  Surgeon: [name], MD;  Location: [place];  Service: General;  Laterality: Right;  . BREAST REDUCTION SURGERY    . CRANIOTOMY Right 07/27/2016   Procedure: Right Suboccipital craniotomy for tumor resection with stereotactic navigation;  Surgeon: Kevan Ny Ditty, MD;  Location: [place];  Service: Neurosurgery;  Laterality: Right;  . PORT-A-CATH REMOVAL N/A 06/16/2015   Procedure: REMOVAL PORT-A-CATH;  Surgeon: [name], MD;  Location: [place];  Service: General;  Laterality: N/A;  . PORTACATH PLACEMENT N/A 12/23/2014   Procedure: INSERTION PORT-A-CATH;  Surgeon: [name], MD;  Location: [place];  Service: General;  Laterality: N/A;  . PORTACATH PLACEMENT Left 07/08/2017   Procedure: INSERTION PORT-A-CATH;  Surgeon: [name], MD;  Location: [place];  Service: General;  Laterality: Left;    Allergies: Patient has no known allergies.  Medications: Prior to Admission medications   Medication Sig Start Date End Date Taking? Authorizing Provider  ALPRAZolam Duanne Moron) 1 MG tablet Take 2 tablets (2 mg total) by mouth at bedtime. 02/06/18   [name], MD  cyclobenzaprine (FLEXERIL) 5 MG tablet Take 1 tablet (5 mg total) by mouth 3 (three) times daily as needed for muscle spasms. Patient not taking: Reported on 12/20/2017 06/08/17   Bruning, [name], PA-C  gabapentin (NEURONTIN) 100 MG capsule Take 1 capsule (100 mg total) by mouth at bedtime. 08/30/17   [name], MD  HYDROcodone-acetaminophen (NORCO/VICODIN) 5-325 MG tablet Take 1-2 tablets by mouth every 6 (six) hours as needed for moderate pain or severe pain. Patient not taking: Reported on 12/20/2017 07/08/17   [name], MD  lidocaine-prilocaine (EMLA) cream APPLY TO AFFECTED AREA ONCE AS DIRECTED 08/06/17   [provider]  lisinopril-hydrochlorothiazide (PRINZIDE,ZESTORETIC) 20-12.5 MG tablet TAKE 1 TABLET BY MOUTH TWICE DAILY 02/14/18   [name], MD  lisinopril-hydrochlorothiazide (PRINZIDE,ZESTORETIC) 20-12.5 MG tablet TAKE 1 TABLET BY MOUTH TWICE DAILY 02/14/18   [name], MD     Family History  Problem Relation Age of Onset  . Aneurysm Mother 50       d.55  . Heart attack Father 78       d.62  . Endometrial cancer Sister 62  . Lung cancer Maternal Uncle        d.68s  . Cancer Maternal Grandmother        unspecified type-possibly stomach d.88s    Social History   Socioeconomic History  . Marital status: Divorced    Spouse name: Not on file  . Number of children: 1  . Years of education: Not on file  . Highest education level: Not on file  Occupational History  . Not on file  Social Needs  . Financial resource strain: Not on file  . Food insecurity:    Worry: Not on file    Inability: Not on file  . Transportation needs:    Medical: Not on file    Non-medical: Not on file  Tobacco Use  . Smoking status: Never Smoker  . Smokeless tobacco: Never Used  Substance and Sexual  Activity  . Alcohol use: Yes    Comment: social  . Drug use: No  . Sexual activity: Yes  Lifestyle  . Physical activity:    Days per week: Not on file    Minutes per session: Not on file  . Stress: Not on file  Relationships  . Social connections:    Talks on phone: Not on file    Gets together: Not on file    Attends religious service: Not on file    Active member of club or organization: Not on file    Attends meetings of clubs or organizations: Not on file     Relationship status: Not on file  Other Topics Concern  . Not on file  Social History Narrative  . Not on file     Review of Systems: A 12 point ROS discussed and pertinent positives are indicated in the HPI above.  All other systems are negative.  Review of Systems  Constitutional: Negative for chills and fever.  Respiratory: Negative for shortness of breath and wheezing.   Cardiovascular: Negative for chest pain and palpitations.  Gastrointestinal: Negative for abdominal pain, nausea and vomiting.  Neurological: Negative for dizziness and headaches.  Psychiatric/Behavioral: Negative for behavioral problems and confusion.    Vital Signs: BP (!) 153/116   Pulse 75   Temp 98.2 F (36.8 C) (Oral)   Resp 16   SpO2 100%   Physical Exam  Constitutional: She is oriented to person, place, and time. She appears well-developed and well-nourished. No distress.  Cardiovascular: Normal rate, regular rhythm and normal heart sounds.  No murmur heard. Pulmonary/Chest: Effort normal and breath sounds normal. No respiratory distress. She has no wheezes.  Abdominal: Soft. There is no tenderness.  Neurological: She is alert and oriented to person, place, and time.  Skin: Skin is warm and dry.  Psychiatric: She has a normal mood and affect. Her behavior is normal. Judgment and thought content normal.  Nursing note and vitals reviewed.    MD Evaluation Airway: WNL Heart: WNL Abdomen: WNL Chest/ Lungs: WNL ASA  Classification: 3 Mallampati/Airway Score: One   Imaging: Ct Chest W Contrast  Result Date: 02/02/2018 CLINICAL DATA:  52 year old female with history of breast cancer diagnosed in 2016, with brain metastases diagnosed in 2017. Status post surgical resection, with chemotherapy ongoing and radiation therapy completed. EXAM: CT CHEST, ABDOMEN, AND PELVIS WITH CONTRAST TECHNIQUE: Multidetector CT imaging of the chest, abdomen and pelvis was performed following the standard protocol  during bolus administration of intravenous contrast. CONTRAST:  142m OMNIPAQUE IOHEXOL 300 MG/ML  SOLN COMPARISON:  CT the chest, abdomen and pelvis 12/12/2017. FINDINGS: CT CHEST FINDINGS Cardiovascular: Heart size is normal. There is no significant pericardial fluid, thickening or pericardial calcification. No atherosclerotic calcifications are noted in the thoracic aorta or the coronary arteries. Left-sided subclavian single-lumen porta cath with tip terminating in the distal superior vena cava. Mediastinum/Nodes: Incompletely image left supraclavicular lymph node remains enlarged a measuring at least 13 mm in short axis (axial image 1 of series 2). No pathologically enlarged mediastinal or hilar lymph nodes. Esophagus is unremarkable in appearance. No axillary lymphadenopathy. Surgical clips in the right axilla from prior lymph node dissection. Lungs/Pleura: No suspicious appearing pulmonary nodules or masses. No acute consolidative airspace disease. Mild scarring throughout the lung bases bilaterally. No pleural effusions. Musculoskeletal: There are no aggressive appearing lytic or blastic lesions noted in the visualized portions of the skeleton. CT ABDOMEN PELVIS FINDINGS Hepatobiliary: 7 mm low-attenuation lesion in  segment 5 of the liver adjacent the gallbladder fossa, incompletely characterized, but stable compared to prior examinations, favored to represent a cyst. No other larger more suspicious appearing cystic or solid hepatic lesions. No intra or extrahepatic biliary ductal dilatation. Gallbladder is normal in appearance. Pancreas: No pancreatic mass. No pancreatic ductal dilatation. No pancreatic or peripancreatic fluid or inflammatory changes. Spleen: Unremarkable. Adrenals/Urinary Tract: Bilateral kidneys and bilateral adrenal glands are normal in appearance. No hydroureteronephrosis. Urinary bladder is normal in appearance. Stomach/Bowel: Normal appearance of the stomach. No pathologic dilatation  of small bowel or colon. Normal appendix. Vascular/Lymphatic: Aortic atherosclerosis, without evidence of aneurysm or dissection in the abdominal or pelvic vasculature. Other than an enlarged right inguinal lymph node (discussed below) there are no additional pathologically enlarged lymph nodes noted elsewhere in the abdomen or pelvis. Reproductive: Uterus and ovaries are unremarkable in appearance. Other: No significant volume of ascites.  No pneumoperitoneum. Musculoskeletal: Subcutaneous nodule in the fat anterior to the right inguinal region (axial image 101 of series 2) is larger and demonstrates less well-defined borders on the prior study, currently measuring 2.2 x 1.7 cm (previously 1.8 x 1.3 cm on 12/12/2017), presumably a metastatic lesion. An adjacent right inguinal lymph node demonstrates avid enhancement and is larger than the prior study, currently measuring 11 mm in short axis (axial image 108 of series 2). There are no aggressive appearing lytic or blastic lesions noted in the visualized portions of the skeleton. IMPRESSION: 1. Metastatic lymphadenopathy in the left supraclavicular nodal station appears stable to the prior examinations. Metastatic lesion in the subcutaneous fat of the right inguinal region has grown compared to the prior study, now with some associated right inguinal lymphadenopathy. 2. No other definite sites of metastatic disease noted elsewhere in the chest, abdomen or pelvis. 3. Aortic atherosclerosis. 4. Additional incidental findings, as above, similar to the prior study. Electronically Signed   By: Vinnie Langton M.D.   On: 02/02/2018 11:08   Ct Abdomen Pelvis W Contrast  Result Date: 02/02/2018 CLINICAL DATA:  52 year old female with history of breast cancer diagnosed in 2016, with brain metastases diagnosed in 2017. Status post surgical resection, with chemotherapy ongoing and radiation therapy completed. EXAM: CT CHEST, ABDOMEN, AND PELVIS WITH CONTRAST TECHNIQUE:  Multidetector CT imaging of the chest, abdomen and pelvis was performed following the standard protocol during bolus administration of intravenous contrast. CONTRAST:  174m OMNIPAQUE IOHEXOL 300 MG/ML  SOLN COMPARISON:  CT the chest, abdomen and pelvis 12/12/2017. FINDINGS: CT CHEST FINDINGS Cardiovascular: Heart size is normal. There is no significant pericardial fluid, thickening or pericardial calcification. No atherosclerotic calcifications are noted in the thoracic aorta or the coronary arteries. Left-sided subclavian single-lumen porta cath with tip terminating in the distal superior vena cava. Mediastinum/Nodes: Incompletely image left supraclavicular lymph node remains enlarged a measuring at least 13 mm in short axis (axial image 1 of series 2). No pathologically enlarged mediastinal or hilar lymph nodes. Esophagus is unremarkable in appearance. No axillary lymphadenopathy. Surgical clips in the right axilla from prior lymph node dissection. Lungs/Pleura: No suspicious appearing pulmonary nodules or masses. No acute consolidative airspace disease. Mild scarring throughout the lung bases bilaterally. No pleural effusions. Musculoskeletal: There are no aggressive appearing lytic or blastic lesions noted in the visualized portions of the skeleton. CT ABDOMEN PELVIS FINDINGS Hepatobiliary: 7 mm low-attenuation lesion in segment 5 of the liver adjacent the gallbladder fossa, incompletely characterized, but stable compared to prior examinations, favored to represent a cyst. No other larger more suspicious appearing  cystic or solid hepatic lesions. No intra or extrahepatic biliary ductal dilatation. Gallbladder is normal in appearance. Pancreas: No pancreatic mass. No pancreatic ductal dilatation. No pancreatic or peripancreatic fluid or inflammatory changes. Spleen: Unremarkable. Adrenals/Urinary Tract: Bilateral kidneys and bilateral adrenal glands are normal in appearance. No hydroureteronephrosis. Urinary  bladder is normal in appearance. Stomach/Bowel: Normal appearance of the stomach. No pathologic dilatation of small bowel or colon. Normal appendix. Vascular/Lymphatic: Aortic atherosclerosis, without evidence of aneurysm or dissection in the abdominal or pelvic vasculature. Other than an enlarged right inguinal lymph node (discussed below) there are no additional pathologically enlarged lymph nodes noted elsewhere in the abdomen or pelvis. Reproductive: Uterus and ovaries are unremarkable in appearance. Other: No significant volume of ascites.  No pneumoperitoneum. Musculoskeletal: Subcutaneous nodule in the fat anterior to the right inguinal region (axial image 101 of series 2) is larger and demonstrates less well-defined borders on the prior study, currently measuring 2.2 x 1.7 cm (previously 1.8 x 1.3 cm on 12/12/2017), presumably a metastatic lesion. An adjacent right inguinal lymph node demonstrates avid enhancement and is larger than the prior study, currently measuring 11 mm in short axis (axial image 108 of series 2). There are no aggressive appearing lytic or blastic lesions noted in the visualized portions of the skeleton. IMPRESSION: 1. Metastatic lymphadenopathy in the left supraclavicular nodal station appears stable to the prior examinations. Metastatic lesion in the subcutaneous fat of the right inguinal region has grown compared to the prior study, now with some associated right inguinal lymphadenopathy. 2. No other definite sites of metastatic disease noted elsewhere in the chest, abdomen or pelvis. 3. Aortic atherosclerosis. 4. Additional incidental findings, as above, similar to the prior study. Electronically Signed   By: Vinnie Langton M.D.   On: 02/02/2018 11:08    Labs:  CBC: Recent Labs    01/30/18 1414 02/06/18 1339 02/20/18 1400 02/27/18 1353  WBC 9.5 6.5 4.1 6.4  HGB 11.9 12.6 12.1 12.3  HCT 36.5 37.8 36.3 36.5  PLT 215 204 229 218    COAGS: No results for input(s):  INR, APTT in the last 8760 hours.  BMP: Recent Labs    01/30/18 1414 02/06/18 1339 02/20/18 1400 02/27/18 1353  NA 139 140 141 141  K 3.5 3.3* 3.1* 3.4*  CL 104 102 106 105  CO2 '27 29 28 27  ' GLUCOSE 87 81 91 84  BUN '9 14 10 15  ' CALCIUM 9.1 9.2 8.9 9.4  CREATININE 0.71 0.80 0.73 0.70  GFRNONAA >60 >60 >60 >60  GFRAA >60 >60 >60 >60    LIVER FUNCTION TESTS: Recent Labs    01/30/18 1414 02/06/18 1339 02/20/18 1400 02/27/18 1353  BILITOT 0.5 0.7 0.6 0.6  AST 14* '17 15 17  ' ALT '9 10 10 12  ' ALKPHOS 117 98 104 100  PROT 7.3 7.8 7.5 7.7  ALBUMIN 3.7 4.0 3.8 3.8    TUMOR MARKERS: No results for input(s): AFPTM, CEA, CA199, CHROMGRNA in the last 8760 hours.  Assessment and Plan:  Metastatic breast cancer. Plan for image-guided right inguinal lymph node biopsy today with Dr. Barbie Banner. Patient is NPO. Afebrile. She does not take blood thinners. INR pending.  Risks and benefits discussed with the patient including, but not limited to bleeding, infection, damage to adjacent structures or low yield requiring additional tests. All of the patient's questions were answered, patient is agreeable to proceed. Consent signed and in chart.   Thank you for this interesting consult.  I greatly enjoyed meeting  Kaitlyn Keith and look forward to participating in their care.  A copy of this report was sent to the requesting provider on this date.  Electronically Signed: Earley Abide, PA-C 03/02/2018, 11:45 AM   I spent a total of 25 Minutes in face to face in clinical consultation, greater than 50% of which was counseling/coordinating care for metastatic breast cancer.

## 2018-03-02 NOTE — Discharge Instructions (Addendum)
You have a bandaid covering the biopsy site. Leave this in place for 24 hours; then you may remove it and shower. No soaking or tub baths until it is fully healed to reduce risk of site infection. Pat biopsy site dry and may cover with another bandaid if needed.    Needle Biopsy, Care After These instructions give you information about caring for yourself after your procedure. Your doctor may also give you more specific instructions. Call your doctor if you have any problems or questions after your procedure. Follow these instructions at home:  Rest as told by your doctor.  Take medicines only as told by your doctor.  There are many different ways to close and cover the biopsy site, including stitches (sutures), skin glue, and adhesive strips. Follow instructions from your doctor about: ? How to take care of your biopsy site. ? When and how you should change your bandage (dressing). ? When you should remove your dressing. ? Removing whatever was used to close your biopsy site.  Check your biopsy site every day for signs of infection. Watch for: ? Redness, swelling, or pain. ? Fluid, blood, or pus. Contact a doctor if:  You have a fever.  You have redness, swelling, or pain at the biopsy site, and it lasts longer than a few days.  You have fluid, blood, or pus coming from the biopsy site.  You feel sick to your stomach (nauseous).  You throw up (vomit). Get help right away if:  You are short of breath.  You have trouble breathing.  Your chest hurts.  You feel dizzy or you pass out (faint).  You have bleeding that does not stop with pressure or a bandage.  You cough up blood.  Your belly (abdomen) hurts. This information is not intended to replace advice given to you by your health care provider. Make sure you discuss any questions you have with your health care provider. Document Released: 04/22/2008 Document Revised: 10/16/2015 Document Reviewed: 05/06/2014 Elsevier  Interactive Patient Education  2018 [place]. Moderate Conscious Sedation, Adult, Care After These instructions provide you with information about caring for yourself after your procedure. Your health care provider may also give you more specific instructions. Your treatment has been planned according to current medical practices, but problems sometimes occur. Call your health care provider if you have any problems or questions after your procedure. What can I expect after the procedure? After your procedure, it is common:  To feel sleepy for several hours.  To feel clumsy and have poor balance for several hours.  To have poor judgment for several hours.  To vomit if you eat too soon.  Follow these instructions at home: For at least 24 hours after the procedure:   Do not: ? Participate in activities where you could fall or become injured. ? Drive. ? Use heavy machinery. ? Drink alcohol. ? Take sleeping pills or medicines that cause drowsiness. ? Make important decisions or sign legal documents. ? Take care of children on your own.  Rest. Eating and drinking  Follow the diet recommended by your health care provider.  If you vomit: ? Drink water, juice, or soup when you can drink without vomiting. ? Make sure you have little or no nausea before eating solid foods. General instructions  Have a responsible adult stay with you until you are awake and alert.  Take over-the-counter and prescription medicines only as told by your health care provider.  If you smoke, do not smoke  without supervision.  Keep all follow-up visits as told by your health care provider. This is important. Contact a health care provider if:  You keep feeling nauseous or you keep vomiting.  You feel light-headed.  You develop a rash.  You have a fever. Get help right away if:  You have trouble breathing. This information is not intended to replace advice given to you by your health care  provider. Make sure you discuss any questions you have with your health care provider. Document Released: 02/28/2013 Document Revised: 10/13/2015 Document Reviewed: 08/30/2015 Elsevier Interactive Patient Education  [employer].

## 2018-03-10 ENCOUNTER — Other Ambulatory Visit: Payer: Self-pay

## 2018-03-10 DIAGNOSIS — C50511 Malignant neoplasm of lower-outer quadrant of right female breast: Secondary | ICD-10-CM

## 2018-03-13 ENCOUNTER — Inpatient Hospital Stay: Payer: BLUE CROSS/BLUE SHIELD

## 2018-03-13 ENCOUNTER — Inpatient Hospital Stay (HOSPITAL_BASED_OUTPATIENT_CLINIC_OR_DEPARTMENT_OTHER): Payer: BLUE CROSS/BLUE SHIELD | Admitting: Hematology and Oncology

## 2018-03-13 DIAGNOSIS — C7931 Secondary malignant neoplasm of brain: Secondary | ICD-10-CM | POA: Diagnosis not present

## 2018-03-13 DIAGNOSIS — C50511 Malignant neoplasm of lower-outer quadrant of right female breast: Secondary | ICD-10-CM

## 2018-03-13 DIAGNOSIS — Z171 Estrogen receptor negative status [ER-]: Principal | ICD-10-CM

## 2018-03-13 DIAGNOSIS — C792 Secondary malignant neoplasm of skin: Secondary | ICD-10-CM

## 2018-03-13 DIAGNOSIS — Z5111 Encounter for antineoplastic chemotherapy: Secondary | ICD-10-CM | POA: Diagnosis not present

## 2018-03-13 LAB — CBC WITH DIFFERENTIAL (CANCER CENTER ONLY)
Abs Immature Granulocytes: 0.02 10*3/uL (ref 0.00–0.07)
BASOS PCT: 1 %
Basophils Absolute: 0 10*3/uL (ref 0.0–0.1)
EOS ABS: 0.1 10*3/uL (ref 0.0–0.5)
Eosinophils Relative: 1 %
HCT: 35.4 % — ABNORMAL LOW (ref 36.0–46.0)
Hemoglobin: 11.7 g/dL — ABNORMAL LOW (ref 12.0–15.0)
Immature Granulocytes: 1 %
Lymphocytes Relative: 34 %
Lymphs Abs: 1.4 10*3/uL (ref 0.7–4.0)
MCH: 29 pg (ref 26.0–34.0)
MCHC: 33.1 g/dL (ref 30.0–36.0)
MCV: 87.8 fL (ref 80.0–100.0)
MONO ABS: 0.4 10*3/uL (ref 0.1–1.0)
MONOS PCT: 10 %
Neutro Abs: 2.2 10*3/uL (ref 1.7–7.7)
Neutrophils Relative %: 53 %
Platelet Count: 214 10*3/uL (ref 150–400)
RBC: 4.03 MIL/uL (ref 3.87–5.11)
RDW: 14.7 % (ref 11.5–15.5)
WBC: 4.1 10*3/uL (ref 4.0–10.5)
nRBC: 0 % (ref 0.0–0.2)

## 2018-03-13 LAB — CMP (CANCER CENTER ONLY)
ALK PHOS: 102 U/L (ref 38–126)
ALT: 11 U/L (ref 0–44)
ANION GAP: 6 (ref 5–15)
AST: 15 U/L (ref 15–41)
Albumin: 3.7 g/dL (ref 3.5–5.0)
BILIRUBIN TOTAL: 0.6 mg/dL (ref 0.3–1.2)
BUN: 11 mg/dL (ref 6–20)
CALCIUM: 9.1 mg/dL (ref 8.9–10.3)
CO2: 26 mmol/L (ref 22–32)
Chloride: 108 mmol/L (ref 98–111)
Creatinine: 0.7 mg/dL (ref 0.44–1.00)
GFR, Est AFR Am: >60 mL/min (ref >60)
Glucose, Bld: 83 mg/dL (ref 70–99)
Potassium: 3.6 mmol/L (ref 3.5–5.1)
Sodium: 140 mmol/L (ref 135–145)
TOTAL PROTEIN: 7.3 g/dL (ref 6.5–8.1)

## 2018-03-13 MED ORDER — SODIUM CHLORIDE 0.9 % IV SOLN
Freq: Once | INTRAVENOUS | Status: AC
  Administered 2018-03-13: 16:00:00 via INTRAVENOUS
  Filled 2018-03-13: qty 250

## 2018-03-13 MED ORDER — SODIUM CHLORIDE 0.9 % IV SOLN
2.0000 mg | Freq: Once | INTRAVENOUS | Status: AC
  Administered 2018-03-13: 2 mg via INTRAVENOUS
  Filled 2018-03-13: qty 4

## 2018-03-13 MED ORDER — ONDANSETRON HCL 4 MG/2ML IJ SOLN
8.0000 mg | Freq: Once | INTRAMUSCULAR | Status: AC
  Administered 2018-03-13: 8 mg via INTRAVENOUS

## 2018-03-13 MED ORDER — SODIUM CHLORIDE 0.9% FLUSH
10.0000 mL | INTRAVENOUS | Status: DC | PRN
  Administered 2018-03-13: 10 mL
  Filled 2018-03-13: qty 10

## 2018-03-13 MED ORDER — HEPARIN SOD (PORK) LOCK FLUSH 100 UNIT/ML IV SOLN
500.0000 [IU] | Freq: Once | INTRAVENOUS | Status: AC | PRN
  Administered 2018-03-13: 500 [IU]
  Filled 2018-03-13: qty 5

## 2018-03-13 MED ORDER — ONDANSETRON HCL 4 MG/2ML IJ SOLN
INTRAMUSCULAR | Status: AC
  Filled 2018-03-13: qty 4

## 2018-03-13 NOTE — Patient Instructions (Signed)
[facility] Discharge Instructions for Patients Receiving Chemotherapy  Today you received the following chemotherapy agents Eribula mesylate (Halaven).  To help prevent nausea and vomiting after your treatment, we encourage you to take your nausea medication as prescribed.   If you develop nausea and vomiting that is not controlled by your nausea medication, call the clinic.   BELOW ARE SYMPTOMS THAT SHOULD BE REPORTED IMMEDIATELY:  *FEVER GREATER THAN 100.5 F  *CHILLS WITH OR WITHOUT FEVER  NAUSEA AND VOMITING THAT IS NOT CONTROLLED WITH YOUR NAUSEA MEDICATION  *UNUSUAL SHORTNESS OF BREATH  *UNUSUAL BRUISING OR BLEEDING  TENDERNESS IN MOUTH AND THROAT WITH OR WITHOUT PRESENCE OF ULCERS  *URINARY PROBLEMS  *BOWEL PROBLEMS  UNUSUAL RASH Items with * indicate a potential emergency and should be followed up as soon as possible.  Feel free to call the clinic should you have any questions or concerns. The clinic phone number is [phone].  Please show the [facility] at check-in to the Emergency Department and triage nurse.

## 2018-03-13 NOTE — Assessment & Plan Note (Signed)
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 Neoadj chemo 12/24/14- 04/29/15 AC x 4 foll by Abraxane X 12 Rt Lumpectomy: Path CR 0/2 LN Adj XRT 07/23/15- 09/08/15  PET/CT scan 11/17/2016:Subcutaneous nodules in the neck, upper back, left arm, abdomen and pelvis  Patient progressed on Xeloda January 2019-07/04/2017 stopped due to progression of disease  Cerebellar mass diagnosed 07/12/2016: Resection followed by stereotactic radiation 07/29/2016  Current treatment:Halaven days 1 and 8 every 3 weeks today cycle11day 1 Chemo toxicities: Severe fatigue Denies any nausea vomiting.  Neutropenia: Receiving Granix injections on Saturdays prior to each chemotherapy.  MRI brain 12/17/2017: No evidence of recurrent or residual metastatic disease to the brain. Post radiation changes in the right cerebellum  CT abdomen and pelvis 12/12/2017: Mixed response to therapy, interval decrease in the mid abdominal nodule(2.4 cm to 1.4 cm)and interval new anterior abdominal nodule (1.7 cm). Increase in right inguinal lymph node (1.1 cm to 1.3 cm)and resolution of the left supraclavicular lymph node  CT CAP:02/02/2018: Met LN Left Supraclavicular nodal station stable, Rt Inguinal LN increased with Rt inguinal lymphadenopathy.  Pathology review: 03/02/2018 biopsy of the inguinal LN: Metastatic poorly differentiated carcinoma ER 0%, PR 0%, HER-2 negative  Return to clinic in3weeks for cycle 12

## 2018-03-13 NOTE — Progress Notes (Signed)
Spoke with Suanne Marker in pathology.  Per Dr. Lindi Adie request, PDL1 added to Accession: [id number].

## 2018-03-13 NOTE — Progress Notes (Signed)
Patient Care Team: [name], MD as PCP - General (Hematology and Oncology) [name], [name], [name], [name], [name], [name], NP (Inactive) as Nurse Practitioner (Nurse Practitioner)  DIAGNOSIS:  Encounter Diagnosis  Name Primary?  . 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%, HER-2 negative ratio 1.43    12/10/2014 Breast MRI    Right breast lower outer quadrant: 2.3 x 2.4 x 2.4 cm rim-enhancing mass abuts the pectoralis fascia but no enhancement of pectoralis muscle, second focus of artifact?'s second tissue marker clip, no lymph nodes    12/10/2014 Clinical Stage    Stage IIA: T2 N0    12/24/2014 - 04/29/2015 Neo-Adjuvant Chemotherapy    Dose dense Adriamycin and Cytoxan 4 followed by weekly Abraxane 12    05/02/2015 Breast MRI    complete radiologic response    06/16/2015 Surgery    Left Lumpectomy: Complete path Response, 0/2 LN    06/16/2015 Pathologic Stage    ypT0 ypN0    07/23/2015 - 09/05/2015 Radiation Therapy    Adjuvant RT: 50.4 Gy in 28 fractions and a boost of 10 Gy in 5 fractions to total dose of 60.4 Gy    10/24/2015 Survivorship    SCP mailed to patient in lieu of in person visit.    07/26/2016 - 07/27/2016 Radiation Therapy     SRS brain    07/27/2016 - 07/29/2016 Hospital Admission    Cerebellar mass: Right suboccipital craniotomy for  tumor resection with stereotactic navigation: Metastatic poorly differentiated adenocarcinoma with extensive necrosis positive for CK 7, MOC 31, CK 5/6; Neg for Er/PR, GATA-3, GCDFP CDX2, Napsin A and TTF-1    11/17/2016 PET scan    Subcutaneous nodules in the neck, upper back, left arm, abdomen and pelvis,. 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 identified in BARD 1, BRCA2 and NF1    12/31/2016 Miscellaneous    Everolimus 10 mg daily for cycle 1 if she cannot tolerate will decrease to 7.5 mg daily    05/24/2017 - 07/04/2017 Chemotherapy    Xeloda 2000 mg 2 weeks on 1 week off stopped due to progression of disease based on CT scans done 06/27/2017    07/25/2017 -  Chemotherapy    Halaven days 1 and 8 every 3 weeks      CHIEF COMPLIANT: Patient is here to review the results of doing well and for biopsy and to receive Halaven chemotherapy  INTERVAL HISTORY: Kaitlyn Keith is a 52 year old with above-mentioned history of metastatic triple negative breast cancer who is currently undergoing palliative chemotherapy with Halaven.  She has been on it since March 2019.  There was progression of disease in the inguinal lymph node and we biopsied it and it came back positive for triple negative metastatic breast cancer.  She is here to discuss the treatment plan.  REVIEW OF SYSTEMS:   Constitutional: Denies fevers, chills or abnormal weight loss Eyes: Denies blurriness of vision Ears, nose, mouth, throat, and face: Denies mucositis or sore throat Respiratory: Denies cough, dyspnea or wheezes Cardiovascular: Denies palpitation, chest discomfort Gastrointestinal:  Denies nausea,  heartburn or change in bowel habits Skin: Denies abnormal skin rashes Lymphatics: Denies new lymphadenopathy or easy bruising Neurological:Denies numbness, tingling or new weaknesses Behavioral/Psych: Mood is stable, no new changes  Extremities: Inguinal lymphadenopathy.  All other systems were reviewed with the patient and are negative.  I have reviewed the past medical history, past surgical history, social history and family history with the patient and they are unchanged from previous note.  ALLERGIES:  has No Known Allergies.  MEDICATIONS:  Current Outpatient Medications  Medication Sig Dispense Refill  . ALPRAZolam (XANAX) 1 MG tablet Take 2 tablets (2 mg total) by mouth at bedtime. 60 tablet 1  . cyclobenzaprine (FLEXERIL) 5 MG tablet Take 1 tablet (5 mg total) by mouth 3 (three) times daily as needed for muscle spasms. (Patient not taking: Reported on 12/20/2017) 30 tablet 0  . gabapentin (NEURONTIN) 100 MG capsule Take 1 capsule (100 mg total) by mouth at bedtime. 30 capsule 6  . HYDROcodone-acetaminophen (NORCO/VICODIN) 5-325 MG tablet Take 1-2 tablets by mouth every 6 (six) hours as needed for moderate pain or severe pain. (Patient not taking: Reported on 12/20/2017) 15 tablet 0  . lidocaine-prilocaine (EMLA) cream APPLY TO AFFECTED AREA ONCE AS DIRECTED  3  . lisinopril-hydrochlorothiazide (PRINZIDE,ZESTORETIC) 20-12.5 MG tablet TAKE 1 TABLET BY MOUTH TWICE DAILY 180 tablet 0   No current facility-administered medications for this visit.     PHYSICAL EXAMINATION: ECOG PERFORMANCE STATUS: 2 - Symptomatic, <50% confined to bed  Vitals:   03/13/18 1445  BP: (!) 146/94  Pulse: 64  Resp: 17  Temp: 98.1 F (36.7 C)  SpO2: 100%   Filed Weights   03/13/18 1445  Weight: 179 lb 12.8 oz (81.6 kg)    GENERAL:alert, no distress and comfortable SKIN: skin color, texture, turgor are normal, no rashes or significant lesions EYES: normal, Conjunctiva are pink and non-injected,  sclera clear OROPHARYNX:no exudate, no erythema and lips, buccal mucosa, and tongue normal  NECK: supple, thyroid normal size, non-tender, without nodularity LYMPH:  no palpable lymphadenopathy in the cervical, axillary or inguinal LUNGS: clear to auscultation and percussion with normal breathing effort HEART: regular rate & rhythm and no murmurs and no lower extremity edema ABDOMEN:abdomen soft, non-tender and normal bowel sounds MUSCULOSKELETAL:no cyanosis of digits and no clubbing  NEURO: alert & oriented x 3 with fluent speech, no focal motor/sensory deficits EXTREMITIES: No lower extremity edema  LABORATORY DATA:  I have reviewed the data as listed CMP Latest Ref Rng & Units 02/27/2018 02/20/2018 02/06/2018  Glucose 70 - 99 mg/dL 84 91 81  BUN 6 - 20 mg/dL '15 10 14  ' Creatinine 0.44 - 1.00 mg/dL 0.70 0.73 0.80  Sodium 135 - 145 mmol/L 141 141 140  Potassium 3.5 - 5.1 mmol/L 3.4(L) 3.1(L) 3.3(L)  Chloride 98 - 111 mmol/L 105 106 102  CO2 22 - 32 mmol/L '27 28 29  ' Calcium 8.9 - 10.3 mg/dL 9.4 8.9 9.2  Total Protein 6.5 - 8.1 g/dL 7.7 7.5 7.8  Total Bilirubin 0.3 - 1.2 mg/dL  0.6 0.6 0.7  Alkaline Phos 38 - 126 U/L 100 104 98  AST 15 - 41 U/L '17 15 17  ' ALT 0 - 44 U/L '12 10 10    ' Lab Results  Component Value Date   WBC 4.1 03/13/2018   HGB 11.7 (L) 03/13/2018   HCT 35.4 (L) 03/13/2018   MCV 87.8 03/13/2018   PLT 214 03/13/2018   NEUTROABS 2.2 03/13/2018    ASSESSMENT & PLAN:  Breast cancer of lower-outer quadrant of right female breast ([place]) Right breast biopsy 12/03/2014 8:00: Invasive ductal carcinoma, grade 3, ER 0%, PR 0%, 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 Neoadj chemo 12/24/14- 04/29/15 AC x 4 foll by Abraxane X 12 Rt Lumpectomy: Path CR 0/2 LN Adj XRT 07/23/15- 09/08/15  PET/CT scan 11/17/2016:Subcutaneous nodules in the neck, upper back, left arm,  abdomen and pelvis  Patient progressed on Xeloda January 2019-07/04/2017 stopped due to progression of disease  Cerebellar mass diagnosed 07/12/2016: Resection followed by stereotactic radiation 07/29/2016  Current treatment:Halaven days 1 and 8 every 3 weeks today cycle11day 1 Chemo toxicities: Severe fatigue Denies any nausea vomiting.  Neutropenia: Receiving Granix injections on Saturdays prior to each chemotherapy.  MRI brain 12/17/2017: No evidence of recurrent or residual metastatic disease to the brain. Post radiation changes in the right cerebellum  CT abdomen and pelvis 12/12/2017: Mixed response to therapy, interval decrease in the mid abdominal nodule(2.4 cm to 1.4 cm)and interval new anterior abdominal nodule (1.7 cm). Increase in right inguinal lymph node (1.1 cm to 1.3 cm)and resolution of the left supraclavicular lymph node  CT CAP:02/02/2018: Met LN Left Supraclavicular nodal station stable, Rt Inguinal LN increased with Rt inguinal lymphadenopathy.  Pathology review: 03/02/2018 biopsy of the inguinal LN: Metastatic poorly differentiated carcinoma ER 0%, PR 0%, HER-2 negative Because of progression of disease, I recommended switching her treatment from Halaven to pembrolizumab immunotherapy. I believe that pembrolizumab could work in her because of her Lynch syndrome gene and the tumor mutational burden of 15. We will send for PDL 1 testing on the inguinal lymph node.  Painful right inguinal lymphadenopathy: I recommended that she see radiation oncology to talk about palliative radiation therapy. I discussed with her that radiation can improve the response to immunotherapy. Our plan is to start immunotherapy in 3 weeks on 04/03/2018. We will cancel all other appointments between now and then.  No orders of the defined types were placed in this encounter.  The patient has a good understanding of the overall plan. she agrees with it. she will call with any  problems that may develop before the next visit here.   Harriette Ohara, MD 03/13/18

## 2018-03-14 ENCOUNTER — Telehealth: Payer: Self-pay | Admitting: Hematology and Oncology

## 2018-03-14 NOTE — Telephone Encounter (Signed)
Per 10/21 los, cancelled all appts except for 04/03/18.

## 2018-03-14 NOTE — Progress Notes (Signed)
Histology and Location of Primary Cancer: Breast cancer of lower-outer quadrant of right female breast ([place])  Location(s) of Symptomatic tumor(s): Painful right inguinal lymphadenopathy  Past/Anticipated chemotherapy by medical oncology, if any: Per Dr. 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 Neoadj chemo 12/24/14- 04/29/15 AC x 4 foll by Abraxane X 12 Rt Lumpectomy: Path CR 0/2 LN Adj XRT 07/23/15- 09/08/15  PET/CT scan 11/17/2016:Subcutaneous nodules in the neck, upper back, left arm, abdomen and pelvis  Patient progressed on Xeloda January 2019-07/04/2017 stopped due to progression of disease  Cerebellar mass diagnosed 07/12/2016: Resection followed by stereotactic radiation 07/29/2016  Current treatment:Halaven days 1 and 8 every 3 weeks today cycle11day 1 Chemo toxicities: Severe fatigue Denies any nausea vomiting.  Neutropenia: Receiving Granix injections on Saturdays prior to each chemotherapy.  MRI brain 12/17/2017: No evidence of recurrent or residual metastatic disease to the brain. Post radiation changes in the right cerebellum  CT abdomen and pelvis 12/12/2017: Mixed response to therapy, interval decrease in the mid abdominal nodule(2.4 cm to 1.4 cm)and interval new anterior abdominal nodule (1.7 cm). Increase in right inguinal lymph node (1.1 cm to 1.3 cm)and resolution of the left supraclavicular lymph node  CT CAP:02/02/2018: Met LN Left Supraclavicular nodal station stable, Rt Inguinal LN increased with Rt inguinal lymphadenopathy.  Pathology review: 03/02/2018 biopsy of the inguinal LN: Metastatic poorly differentiated carcinoma ER 0%, PR 0%, HER-2  negative Because of progression of disease, I recommended switching her treatment from Halaven to pembrolizumab immunotherapy. I believe that pembrolizumab could work in her because of her Lynch syndrome gene and the tumor mutational burden of 15. We will send for PDL 1 testing on the inguinal lymph node.  Painful right inguinal lymphadenopathy: I recommended that she see radiation oncology to talk about palliative radiation therapy. I discussed with her that radiation can improve the response to immunotherapy. Our plan is to start immunotherapy in 3 weeks on 04/03/2018. We will cancel all other appointments between now and then.   Patient's main complaints related to symptomatic tumor(s) are: none  Pain on a scale of 0-10 is: 0  Ambulatory status? Walker? Wheelchair?: walking unassisted  SAFETY ISSUES:  Prior radiation? yes  Pacemaker/ICD? No  Possible current pregnancy? no  Is the patient on methotrexate? no  Additional Complaints / other details:  none

## 2018-03-15 ENCOUNTER — Other Ambulatory Visit: Payer: Self-pay

## 2018-03-15 ENCOUNTER — Other Ambulatory Visit: Payer: Self-pay | Admitting: Hematology and Oncology

## 2018-03-15 ENCOUNTER — Ambulatory Visit
Admission: RE | Admit: 2018-03-15 | Discharge: 2018-03-15 | Disposition: A | Discharge status: Home / Self Care | Payer: BLUE CROSS/BLUE SHIELD | Source: Ambulatory Visit | Attending: Radiation Oncology | Admitting: Radiation Oncology

## 2018-03-15 ENCOUNTER — Encounter: Payer: Self-pay | Admitting: Radiation Oncology

## 2018-03-15 VITALS — BP 135/98 | HR 85 | Temp 98.6°F | Wt 178.6 lb

## 2018-03-15 DIAGNOSIS — Z79899 Other long term (current) drug therapy: Secondary | ICD-10-CM | POA: Diagnosis not present

## 2018-03-15 DIAGNOSIS — Z923 Personal history of irradiation: Secondary | ICD-10-CM | POA: Diagnosis not present

## 2018-03-15 DIAGNOSIS — C792 Secondary malignant neoplasm of skin: Secondary | ICD-10-CM

## 2018-03-15 DIAGNOSIS — C50511 Malignant neoplasm of lower-outer quadrant of right female breast: Secondary | ICD-10-CM | POA: Insufficient documentation

## 2018-03-15 DIAGNOSIS — Z510 Encounter for antineoplastic radiation therapy: Secondary | ICD-10-CM | POA: Insufficient documentation

## 2018-03-15 NOTE — Progress Notes (Signed)
See progress note under MD encounter

## 2018-03-15 NOTE — Progress Notes (Signed)
DISCONTINUE ON PATHWAY REGIMEN - Breast     A cycle is every 21 days:     Eribulin mesylate   **Always confirm dose/schedule in your pharmacy ordering system**  REASON: Disease Progression PRIOR TREATMENT: [id number]: Eribulin 1.4 mg/m2 D1, 8 q21 Days Until Progression or Unacceptable Toxicity TREATMENT RESPONSE: Stable Disease (SD)  START OFF PATHWAY REGIMEN - Breast   [id number]:Pembrolizumab 2 mg/kg q21 Days:   A cycle is every 21 days:     Pembrolizumab   **Always confirm dose/schedule in your pharmacy ordering system**  Patient Characteristics: Distant Metastases or Locoregional Recurrent Disease - Unresected or Locally Advanced Unresectable Disease Progressing after Neoadjuvant and Local Therapies, HER2 Negative/Unknown/Equivocal, ER Negative/Unknown, Chemotherapy, Third Line and Beyond, Prior  Eribulin Therapeutic Status: Distant Metastases BRCA Mutation Status: Absent ER Status: Negative (-) HER2 Status: Negative (-) PR Status: Negative (-) Line of Therapy: Third Line and Beyond Intent of Therapy: Non-Curative / Palliative Intent, Discussed with Patient

## 2018-03-15 NOTE — Progress Notes (Signed)
  Radiation Oncology         [phone] ________________________________  Name: Kaitlyn Keith MRN: [id number]  Date: 03/15/2018  DOB: May 03, 1966  SIMULATION AND TREATMENT PLANNING NOTE    DIAGNOSIS:  Metastatic Triple Negative Breast Cancer  NARRATIVE:  The patient was brought to the [facility].  Identity was confirmed.  All relevant records and images related to the planned course of therapy were reviewed.  The patient freely provided informed written consent to proceed with treatment after reviewing the details related to the planned course of therapy. The consent form was witnessed and verified by the simulation staff.  Then, the patient was set-up in a stable reproducible  supine position for radiation therapy.  CT images were obtained.  Surface markings were placed.  The CT images were loaded into the planning software.  Then the target and avoidance structures were contoured.  Treatment planning then occurred.  The radiation prescription was entered and confirmed.  Then, I designed and supervised the construction of a total of 8 medically necessary complex treatment devices.  I have requested : Isodose Plan.  I have ordered:dose calc.  PLAN:  The patient will receive 30 Gy in 10 fractions. Three Separate treatment areas will be used for treatment. The first field is directed at the lymph nodes in the left supraclavicular region. The second field is directed at the lymph nodes within the right inguinal area and the final field is directed at the subcutaneous metastasis along the left flank area.  -----------------------------------  Blair Promise, PhD, MD  This document serves as a record of services personally performed by [name], MD. It was created on his behalf by Wilburn Mylar, a trained medical scribe. The creation of this record is based on the scribe's personal observations and the provider's statements to them. This document has been checked  and approved by the attending provider.

## 2018-03-15 NOTE — Progress Notes (Signed)
Radiation Oncology         [phone] ________________________________  Name: Kaitlyn Keith MRN: [id number]  Date: 03/15/2018  DOB: [birth year]  ReConsultation Note  CC: [name], MD  [name], MD    ICD-10-CM   1. Malignant neoplasm of lower-outer quadrant of right female breast, unspecified estrogen receptor status ([place]) C50.511     Diagnosis:   Metastatic Breast Cancer  Cancer Staging Breast cancer of lower-outer quadrant of right female breast [facility]) Staging form: Breast, AJCC 7th Edition - Clinical stage from 12/11/2014: Stage IIA (T2, N0, M0) - Unsigned Staging comments: Staged at breast conference on 7.20.16 - Pathologic stage from 06/16/2015: yT0, N0, cM0 - Unsigned   Interval Since Last Radiation:  7 months and 3 weeks  07/11/17 - 07/22/17 palliative XRT for painful subcutaneous nodules 1. Left Flank / 30 Gy in 10 fractions 2. Left Groin / 30 Gy in 10 fractions  07/26/16 Preop SRS Treatment: PTV1 Right Cerebellum was treated to 18 Gy in 1 fraction  07/23/15 - 09/05/15: Right Breast was treated to 50.4 Gy with a 10 Gy boost  Narrative:  The patient returns today for re-consultation following lymph node biopsy performed on 03/02/18. She was originally seen by me in breast clinic on 12/11/14, and by Dr. Tammi Klippel on 07/19/16 for [place] and on 07/06/17 for skin metastases. She reports the right inguinal nodule has been growing the last 2 months and is causing pain in this area. She is also recently developed pain in the left supraclavicular region as well as in the left flank area.  Recent chest/abdomen/pelvis CT scan performed on 02/02/18 revealed: 1. Metastatic lymphadenopathy in the left supraclavicular nodal station appears stable to the prior examinations. Metastatic lesion in the subcutaneous fat of the right inguinal region has grown compared to the prior study, now with some associated right inguinal lymphadenopathy. 2. No other definite sites of metastatic  disease noted elsewhere in the chest, abdomen or pelvis.  Lymph node biopsy on 03/02/18 revealed: metastatic poorly differentiated carcinoma. Stains showed triple negative ER/PR/Her-2 matching her breast cancer.   ALLERGIES:  has No Known Allergies.  Meds: Current Outpatient Medications  Medication Sig Dispense Refill  . ALPRAZolam (XANAX) 1 MG tablet Take 2 tablets (2 mg total) by mouth at bedtime. 60 tablet 1  . cyclobenzaprine (FLEXERIL) 5 MG tablet Take 1 tablet (5 mg total) by mouth 3 (three) times daily as needed for muscle spasms. 30 tablet 0  . gabapentin (NEURONTIN) 100 MG capsule Take 1 capsule (100 mg total) by mouth at bedtime. 30 capsule 6  . HYDROcodone-acetaminophen (NORCO/VICODIN) 5-325 MG tablet Take 1-2 tablets by mouth every 6 (six) hours as needed for moderate pain or severe pain. 15 tablet 0  . lidocaine-prilocaine (EMLA) cream APPLY TO AFFECTED AREA ONCE AS DIRECTED  3  . lisinopril-hydrochlorothiazide (PRINZIDE,ZESTORETIC) 20-12.5 MG tablet TAKE 1 TABLET BY MOUTH TWICE DAILY 180 tablet 0   No current facility-administered medications for this encounter.     Physical Findings: The patient is in no acute distress. Patient is alert and oriented.  weight is 178 lb 9.6 oz (81 kg). Her oral temperature is 98.6 F (37 C). Her blood pressure is 135/98 (abnormal) and her pulse is 85. Her oxygen saturation is 100%.  Lungs are clear to auscultation bilaterally. Heart has regular rate and rhythm. No palpable cervical, supraclavicular, or axillary adenopathy. Abdomen soft, non-tender, normal bowel sounds. Left supraclavicular fossa: patient has two palpable lymph nodes, measuring approximately 1.5 cm each.  These areas are somewhat tender to palpation. She has a palpable right inguinal lymph node, which is approximately 2-2.5 cm in size and is somewhat tender to palpation. Patient also has a tender area to the left flank, but no palpable lesion in the area. Radiation changes noted to  the skin in this area from prior treatment.   Lab Findings: Lab Results  Component Value Date   WBC 4.1 03/13/2018   HGB 11.7 (L) 03/13/2018   HCT 35.4 (L) 03/13/2018   MCV 87.8 03/13/2018   PLT 214 03/13/2018    Radiographic Findings: [place] Core Biopsy (lymph Nodes)  Result Date: 03/02/2018 INDICATION: Right inguinal adenopathy.  Breast cancer. EXAM: ULTRASOUND GUIDED CORE BIOPSY OF RIGHT INGUINAL LYMPH NODE MEDICATIONS: None. ANESTHESIA/SEDATION: Fentanyl 100 mcg IV; Versed 2 mg IV Moderate Sedation Time:  10 minutes The patient was continuously monitored during the procedure by the interventional radiology nurse under my direct supervision. PROCEDURE: The procedure, risks, benefits, and alternatives were explained to the patient. Questions regarding the procedure were encouraged and answered. The patient understands and consents to the procedure. The right inguinal region was prepped with ChloraPrep in a sterile fashion, and a sterile drape was applied covering the operative field. A sterile gown and sterile gloves were used for the procedure. Local anesthesia was provided with 1% Lidocaine. Under sonographic guidance, 4 18 gauge core biopsies of the enlarged right inguinal lymph node were obtained. COMPLICATIONS: None immediate. FINDINGS: Images document needle placement in the right inguinal lymph node. Post biopsy images demonstrate no evidence of hemorrhage. IMPRESSION: Successful core biopsy of a right inguinal lymph node under sonographic guidance. Electronically Signed   By: Marybelle Killings M.D.   On: 03/02/2018 14:50    Impression:  Metastatic Triple Negative Breast Cancer  The patient has 3 painful areas related to subcutaneous tumors/lymph nodes. The patient would be a good candidate for a short course of palliative radiation therapy directed at these three areas.   Today, I talked to the patient  about the findings and work-up thus far.  We discussed the natural history of metastatic  breast cancer and general treatment, highlighting the role of radiotherapy in the management.  We discussed the available radiation techniques, and focused on the details of logistics and delivery.  We reviewed the anticipated acute and late sequelae associated with radiation in this setting.  The patient was encouraged to ask questions that I answered to the best of my ability.  A patient consent form was discussed and signed.  We retained a copy for our records.  The patient would like to proceed with radiation and will be scheduled for CT simulation.   Plan:  Patient will undergo CT simulation later today with treatments to start early next week. Anticipate 10 treatments to all three areas (right inguinal, left supraclavicular, left flank)  Addendum:Treatment planning CT scan today did show a small subcutaneous nodule in the left flank area which is likely causing her discomfort in this area. This area is likely residual from her previous palliative radiation therapy to this area.  -----------------------------------  Blair Promise, PhD, MD  This document serves as a record of services personally performed by [name], MD. It was created on his behalf by Wilburn Mylar, a trained medical scribe. The creation of this record is based on the scribe's personal observations and the provider's statements to them. This document has been checked and approved by the attending provider.

## 2018-03-16 ENCOUNTER — Other Ambulatory Visit: Payer: Self-pay | Admitting: Radiation Therapy

## 2018-03-16 DIAGNOSIS — C7949 Secondary malignant neoplasm of other parts of nervous system: Principal | ICD-10-CM

## 2018-03-16 DIAGNOSIS — C7931 Secondary malignant neoplasm of brain: Secondary | ICD-10-CM

## 2018-03-20 ENCOUNTER — Ambulatory Visit
Admission: RE | Admit: 2018-03-20 | Discharge: 2018-03-20 | Disposition: A | Discharge status: Home / Self Care | Payer: BLUE CROSS/BLUE SHIELD | Source: Ambulatory Visit | Attending: Radiation Oncology | Admitting: Radiation Oncology

## 2018-03-20 ENCOUNTER — Other Ambulatory Visit: Payer: BLUE CROSS/BLUE SHIELD

## 2018-03-20 ENCOUNTER — Ambulatory Visit: Payer: BLUE CROSS/BLUE SHIELD

## 2018-03-20 DIAGNOSIS — C792 Secondary malignant neoplasm of skin: Secondary | ICD-10-CM

## 2018-03-20 DIAGNOSIS — Z510 Encounter for antineoplastic radiation therapy: Secondary | ICD-10-CM | POA: Diagnosis not present

## 2018-03-20 NOTE — Progress Notes (Signed)
.  Simulation verification  The patient was brought to the treatment machine and placed in the plan treatment position.  Clinical set up was verified to ensure that the target region is appropriately covered for the patient's upcoming electron boost treatment.  The targeted volume of tissue is appropriately covered by the radiation field.  Based on my personal review, I approve the simulation verification.  The patient's treatment will proceed as planned.  ------------------------------------------------  -----------------------------------  Ottilie Wigglesworth D. Elick Aguilera, PhD, MD  

## 2018-03-20 NOTE — Progress Notes (Signed)
  Radiation Oncology         [phone] ________________________________  Name: Kaitlyn Keith MRN: [id number]  Date: 03/20/2018  DOB: 09-14-1965  Simulation Verification Note   Status: outpatient  NARRATIVE: The patient was brought to the treatment unit and placed in the planned treatment position. The clinical setup was verified. Then port films were obtained and uploaded to the radiation oncology medical record software.  The treatment beams were carefully compared against the planned radiation fields. The position location and shape of the radiation fields was reviewed. They targeted volume of tissue appears to be appropriately covered by the radiation beams. Organs at risk appear to be excluded as planned.  Based on my personal review, I approved the simulation verification. The patient's treatment will proceed as planned.  -----------------------------------  Blair Promise, PhD, MD

## 2018-03-21 ENCOUNTER — Ambulatory Visit
Admission: RE | Admit: 2018-03-21 | Discharge: 2018-03-21 | Disposition: A | Discharge status: Home / Self Care | Payer: BLUE CROSS/BLUE SHIELD | Source: Ambulatory Visit | Attending: Radiation Oncology | Admitting: Radiation Oncology

## 2018-03-21 DIAGNOSIS — Z510 Encounter for antineoplastic radiation therapy: Secondary | ICD-10-CM | POA: Diagnosis not present

## 2018-03-22 ENCOUNTER — Ambulatory Visit
Admission: RE | Admit: 2018-03-22 | Discharge: 2018-03-22 | Disposition: A | Discharge status: Home / Self Care | Payer: BLUE CROSS/BLUE SHIELD | Source: Ambulatory Visit | Attending: Radiation Oncology | Admitting: Radiation Oncology

## 2018-03-22 DIAGNOSIS — Z510 Encounter for antineoplastic radiation therapy: Secondary | ICD-10-CM | POA: Diagnosis not present

## 2018-03-23 ENCOUNTER — Other Ambulatory Visit: Payer: Self-pay | Admitting: Radiation Therapy

## 2018-03-23 ENCOUNTER — Ambulatory Visit
Admission: RE | Admit: 2018-03-23 | Discharge: 2018-03-23 | Disposition: A | Discharge status: Home / Self Care | Payer: BLUE CROSS/BLUE SHIELD | Source: Ambulatory Visit | Attending: Radiation Oncology | Admitting: Radiation Oncology

## 2018-03-23 DIAGNOSIS — Z510 Encounter for antineoplastic radiation therapy: Secondary | ICD-10-CM | POA: Diagnosis not present

## 2018-03-24 ENCOUNTER — Ambulatory Visit (HOSPITAL_COMMUNITY)
Admission: RE | Admit: 2018-03-24 | Discharge: 2018-03-24 | Disposition: A | Discharge status: Home / Self Care | Payer: BLUE CROSS/BLUE SHIELD | Source: Ambulatory Visit | Attending: Radiation Oncology | Admitting: Radiation Oncology

## 2018-03-24 ENCOUNTER — Ambulatory Visit
Admission: RE | Admit: 2018-03-24 | Discharge: 2018-03-24 | Disposition: A | Discharge status: Home / Self Care | Payer: BLUE CROSS/BLUE SHIELD | Source: Ambulatory Visit | Attending: Radiation Oncology | Admitting: Radiation Oncology

## 2018-03-24 DIAGNOSIS — C7949 Secondary malignant neoplasm of other parts of nervous system: Secondary | ICD-10-CM | POA: Diagnosis present

## 2018-03-24 DIAGNOSIS — Z510 Encounter for antineoplastic radiation therapy: Secondary | ICD-10-CM | POA: Insufficient documentation

## 2018-03-24 DIAGNOSIS — C50511 Malignant neoplasm of lower-outer quadrant of right female breast: Secondary | ICD-10-CM | POA: Diagnosis not present

## 2018-03-24 DIAGNOSIS — C7931 Secondary malignant neoplasm of brain: Secondary | ICD-10-CM

## 2018-03-24 MED ORDER — GADOBUTROL 1 MMOL/ML IV SOLN
8.0000 mL | Freq: Once | INTRAVENOUS | Status: AC | PRN
  Administered 2018-03-24: 8 mL via INTRAVENOUS

## 2018-03-27 ENCOUNTER — Ambulatory Visit
Admission: RE | Admit: 2018-03-27 | Discharge: 2018-03-27 | Disposition: A | Discharge status: Home / Self Care | Payer: BLUE CROSS/BLUE SHIELD | Source: Ambulatory Visit | Attending: Radiation Oncology | Admitting: Radiation Oncology

## 2018-03-27 DIAGNOSIS — Z510 Encounter for antineoplastic radiation therapy: Secondary | ICD-10-CM | POA: Diagnosis not present

## 2018-03-28 ENCOUNTER — Ambulatory Visit
Admission: RE | Admit: 2018-03-28 | Discharge: 2018-03-28 | Disposition: A | Discharge status: Home / Self Care | Payer: BLUE CROSS/BLUE SHIELD | Source: Ambulatory Visit | Attending: Radiation Oncology | Admitting: Radiation Oncology

## 2018-03-28 DIAGNOSIS — Z510 Encounter for antineoplastic radiation therapy: Secondary | ICD-10-CM | POA: Diagnosis not present

## 2018-03-29 ENCOUNTER — Ambulatory Visit
Admission: RE | Admit: 2018-03-29 | Discharge: 2018-03-29 | Disposition: A | Discharge status: Home / Self Care | Payer: BLUE CROSS/BLUE SHIELD | Source: Ambulatory Visit | Attending: Radiation Oncology | Admitting: Radiation Oncology

## 2018-03-29 DIAGNOSIS — Z510 Encounter for antineoplastic radiation therapy: Secondary | ICD-10-CM | POA: Diagnosis not present

## 2018-03-30 ENCOUNTER — Ambulatory Visit
Admission: RE | Admit: 2018-03-30 | Discharge: 2018-03-30 | Disposition: A | Discharge status: Home / Self Care | Payer: BLUE CROSS/BLUE SHIELD | Source: Ambulatory Visit | Attending: Radiation Oncology | Admitting: Radiation Oncology

## 2018-03-30 DIAGNOSIS — Z510 Encounter for antineoplastic radiation therapy: Secondary | ICD-10-CM | POA: Diagnosis not present

## 2018-03-31 ENCOUNTER — Other Ambulatory Visit: Payer: Self-pay

## 2018-03-31 ENCOUNTER — Ambulatory Visit
Admission: RE | Admit: 2018-03-31 | Discharge: 2018-03-31 | Disposition: A | Discharge status: Home / Self Care | Payer: BLUE CROSS/BLUE SHIELD | Source: Ambulatory Visit | Attending: Radiation Oncology | Admitting: Radiation Oncology

## 2018-03-31 DIAGNOSIS — Z510 Encounter for antineoplastic radiation therapy: Secondary | ICD-10-CM | POA: Diagnosis not present

## 2018-03-31 DIAGNOSIS — C50511 Malignant neoplasm of lower-outer quadrant of right female breast: Secondary | ICD-10-CM

## 2018-04-02 NOTE — Assessment & Plan Note (Addendum)
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 Neoadj chemo 12/24/14- 04/29/15 AC x 4 foll by Abraxane X 12 Rt Lumpectomy: Path CR 0/2 LN Adj XRT 07/23/15- 09/08/15   PET/CT scan 11/17/2016:Subcutaneous nodules in the neck, upper back, left arm, abdomen and pelvis  Patient progressed on Xeloda January 2019-07/04/2017 stopped due to progression of disease  Cerebellar mass diagnosed 07/12/2016: Resection followed by stereotactic radiation 07/29/2016  MRI brain 12/17/2017: No evidence of recurrent or residual metastatic disease to the brain. Post radiation changes in the right cerebellum  CT abdomen and pelvis 12/12/2017: Mixed response to therapy, interval decrease in the mid abdominal nodule(2.4 cm to 1.4 cm)and interval new anterior abdominal nodule (1.7 cm). Increase in right inguinal lymph node (1.1 cm to 1.3 cm)and resolution of the left supraclavicular lymph node  CT CAP:02/02/2018: Met LN Left Supraclavicular nodal station stable, Rt Inguinal LN increased with Rt inguinal lymphadenopathy, lymphadenopathy treated with radiation  MRI brain 03/24/2018 is stable, no new or recurrent metastases.  Lymph node from 03/02/2018 biopsy: PDL1+  _____________________________________________________________________________  Current treatment:Pembrolizumab given every 3 weeks starting 04/03/18  Kaitlyn Keith and I reviewed the Pembrolizumab today and she will start this.  She is very hopeful that it will work for her.  She and I reviewed possible adverse effects, risks/benefits. I added on TSH lab orders today as well.  We reviewed her MRI results from 03/24/2018 today and I printed out a copy for her at her request.  She is very happy with the news.  We also worked together to coordinate her schedule for  December.  Return to clinic in3weeks for cycle 2

## 2018-04-02 NOTE — Progress Notes (Signed)
[facility] Follow up:    [name], MD [place] [id number]   DIAGNOSIS: Cancer Staging Breast cancer of lower-outer quadrant of right female breast [facility]) Staging form: Breast, AJCC 7th Edition - Clinical stage from 12/11/2014: Stage IIA (T2, N0, M0) - Unsigned Staging comments: Staged at breast conference on 7.20.16 - Pathologic stage from 06/16/2015: yT0, N0, cM0 - Unsigned   SUMMARY OF ONCOLOGIC HISTORY:   Breast cancer of lower-outer quadrant of right female breast ([place])   12/03/2014 Mammogram    Right breast mass 1.9 cm it o'clock position 8 cm depth from the nipple    12/03/2014 Initial Diagnosis    Right breast biopsy 8:00: Invasive ductal carcinoma, grade 3, ER 0%, PR 0%, Ki-67 90%, HER-2 negative ratio 1.43    12/10/2014 Breast MRI    Right breast lower outer quadrant: 2.3 x 2.4 x 2.4 cm rim-enhancing mass abuts the pectoralis fascia but no enhancement of pectoralis muscle, second focus of artifact?'s second tissue marker clip, no lymph nodes    12/10/2014 Clinical Stage    Stage IIA: T2 N0    12/24/2014 - 04/29/2015 Neo-Adjuvant Chemotherapy    Dose dense Adriamycin and Cytoxan 4 followed by weekly Abraxane 12    05/02/2015 Breast MRI    complete radiologic response    06/16/2015 Surgery    Left Lumpectomy: Complete path Response, 0/2 LN    06/16/2015 Pathologic Stage    ypT0 ypN0    07/23/2015 - 09/05/2015 Radiation Therapy    Adjuvant RT: 50.4 Gy in 28 fractions and a boost of 10 Gy in 5 fractions to total dose of 60.4 Gy    10/24/2015 Survivorship    SCP mailed to patient in lieu of in person visit.    07/26/2016 - 07/27/2016 Radiation Therapy     SRS brain    07/27/2016 - 07/29/2016 Hospital Admission    Cerebellar mass: Right suboccipital craniotomy for tumor resection with stereotactic navigation: Metastatic poorly differentiated adenocarcinoma with extensive necrosis positive for CK 7, MOC 31, CK 5/6; Neg for  Er/PR, GATA-3, GCDFP CDX2, Napsin A and TTF-1    11/17/2016 PET scan    Subcutaneous nodules in the neck, upper back, left arm, abdomen and pelvis,. 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 identified in BARD 1, BRCA2 and NF1    12/31/2016 Miscellaneous    Everolimus 10 mg daily for cycle 1 if she cannot tolerate will decrease to 7.5 mg daily    05/24/2017 - 07/04/2017 Chemotherapy    Xeloda 2000 mg 2 weeks on 1 week off stopped due to progression of disease based on CT scans done 06/27/2017    07/25/2017 -  Chemotherapy    [place] days 1 and 8 every 3 weeks     03/20/2018 - 03/31/2018 Radiation Therapy    Radiation to lymph nodes    04/02/2018 -  Chemotherapy    The patient had pembrolizumab (KEYTRUDA) 200 mg in sodium chloride 0.9 % 50 mL chemo infusion, 200 mg, Intravenous, Once, 1 of 4 cycles Administration: 200 mg (04/03/2018)  for chemotherapy treatment.      CURRENT THERAPY: Keytruda  INTERVAL HISTORY: Seeley Southgate 52 y.o. female returns for evaluaiton prior tor  receiving Pembro/Keytruda.  She is doing well today and previously underwent radiation to her left supraclavicular and right inguinal lymph nodes along with left flank area.  She tolerated this well and notes that her lymph nodes are improved.  She would like to know the results of her recent brain MRI.  She is feeling well today.     Patient Active Problem List   Diagnosis Date Noted  . 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 Goals of care, counseling/discussion 07/04/2017  . Genetic testing 12/10/2016  . 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 Cerebellar mass 07/27/2016  . Cerebellar  tumor ([place]) 07/27/2016  . 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 C2 cervical fracture ([place]) 07/12/2016  . Hypertension 07/12/2016  . Degenerative spinal arthritis 11/18/2015  . Chemotherapy-induced peripheral neuropathy ([place]) 06/23/2015  . Paronychia of great toe, left 02/04/2015  . Breast cancer of lower-outer quadrant of right female breast ([place]) 12/05/2014    has No Known Allergies.  MEDICAL HISTORY: Past Medical History:  Diagnosis Date  . Anxiety   . Arthritis   . Back pain   . Brain cancer ([place])    brian met from triple negative breast ca  . Breast cancer ([place])   . Breast cancer of lower-outer quadrant of right female breast ([place]) 12/05/2014  . Depression   . FH: chemotherapy 12/2014-04/2015  . Genetic testing 12/10/2016   [name] underwent genetic counseling and testing for hereditary cancer syndromes on 11/18/2016. 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 Mutations in MSH6 are associated with a hereditary cancer syndrome called Lynch syndrome. For more detailed discussion, please see genetic counseling documentation from 12/10/2016.  Testing was perfo  . Headache    due to brain cancer, no longer having them  . Hot flashes   . Hypertension   . 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 underwent genetic counseling and testing for hereditary cancer syndromes on 11/18/2016. 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 Mutations in MSH6 are associated with a hereditary cancer syndrome called Lynch syndrome. For more detailed discussion, please see genetic counseling documentation from 12/10/2016.  Testing was perfo  . Neuromuscular disorder (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 Radiation 07/23/15-09/05/15   right breast 50.4 Gy, boost of 10 Gy    SURGICAL HISTORY: Past Surgical History:  Procedure Laterality  Date  . APPLICATION OF CRANIAL NAVIGATION Right 07/27/2016   Procedure: APPLICATION OF CRANIAL NAVIGATION;  Surgeon: Kevan Ny Ditty, MD;  Location: [facility];  Service: Neurosurgery;  Laterality: Right;  . BIOPSY OF SKIN SUBCUTANEOUS TISSUE AND/OR MUCOUS MEMBRANE Left 12/24/2016   Procedure: OPEN BIOPSY LESIONS ON LEFT LOWER BACK AND SHOULDER BLADE;  Surgeon: [name], MD;  Location: [facility];  Service: General;  Laterality: Left;  . BREAST LUMPECTOMY WITH NEEDLE LOCALIZATION AND AXILLARY SENTINEL LYMPH NODE BX Right 06/16/2015   Procedure: BREAST LUMPECTOMY WITH NEEDLE LOCALIZATION AND AXILLARY SENTINEL LYMPH NODE BX;  Surgeon: [name], MD;  Location: [facility];  Service: General;  Laterality: Right;  . BREAST REDUCTION SURGERY    . CRANIOTOMY Right 07/27/2016   Procedure: Right Suboccipital craniotomy for tumor resection with stereotactic navigation;  Surgeon: Kevan Ny Ditty, MD;  Location: [facility];  Service: Neurosurgery;  Laterality: Right;  . PORT-A-CATH REMOVAL N/A 06/16/2015   Procedure: REMOVAL PORT-A-CATH;  Surgeon: [name], MD;  Location: [facility];  Service: General;  Laterality: N/A;  . PORTACATH PLACEMENT N/A 12/23/2014   Procedure: INSERTION PORT-A-CATH;  Surgeon: [name], MD;  Location: [facility];  Service: General;  Laterality: N/A;  . PORTACATH PLACEMENT Left 07/08/2017   Procedure: INSERTION PORT-A-CATH;  Surgeon: [name], MD;  Location: [facility];  Service: General;  Laterality: Left;    SOCIAL HISTORY: Social History   Socioeconomic History  . Marital status: Divorced    Spouse name: Not on file  . Number of children: 1  . Years of education: Not on file  . Highest education level: Not on file  Occupational History  . Not on file  Social Needs  . Financial resource strain: Not on file  . Food insecurity:    Worry: Not on file    Inability: Not on file  . Transportation needs:     Medical: No    Non-medical: No  Tobacco Use  . Smoking status: Never Smoker  . Smokeless tobacco: Never Used  Substance and Sexual Activity  . Alcohol use: Yes    Comment: social  . Drug use: No  . Sexual activity: Yes  Lifestyle  . Physical activity:    Days per week: Not on file    Minutes per session: Not on file  . Stress: Not on file  Relationships  . Social connections:    Talks on phone: Not on file    Gets together: Not on file    Attends religious service: Not on file    Active member of club or organization: Not on file    Attends meetings of clubs or organizations: Not on file    Relationship status: Not on file  . Intimate partner violence:    Fear of current or ex partner: No    Emotionally abused: No    Physically abused: No    Forced sexual activity: No  Other Topics Concern  . Not on file  Social History Narrative  . Not on file    FAMILY HISTORY: Family History  Problem Relation Age of Onset  . Aneurysm Mother 89       d.55  . Heart attack Father 38       d.62  . Endometrial cancer Sister 42  . Lung cancer Maternal Uncle        d.68s  . Cancer Maternal Grandmother        unspecified type-possibly stomach d.88s    Review of Systems  Constitutional: Positive for fatigue. Negative for appetite change, chills and unexpected weight change.  HENT:   Negative for hearing loss, lump/mass and trouble swallowing.   Eyes: Negative for eye problems and icterus.  Respiratory: Negative for chest tightness, cough and shortness of breath.   Cardiovascular: Negative for chest pain, leg swelling and palpitations.  Gastrointestinal: Negative for abdominal distention, abdominal pain, constipation, diarrhea, nausea and vomiting.  Endocrine: Negative for hot flashes.  Skin: Negative for itching and rash.  Neurological: Negative for dizziness, extremity weakness and numbness.  Hematological: Negative for adenopathy. Does not bruise/bleed easily.   Psychiatric/Behavioral: Negative for depression. The patient is not nervous/anxious.       PHYSICAL EXAMINATION  ECOG PERFORMANCE STATUS: 1 - Symptomatic but completely ambulatory  Vitals:   04/03/18 1342  BP: 121/85  Pulse: 85  Resp: 16  Temp: 97.6 F (36.4 C)  SpO2: 98%    Physical Exam  Constitutional: She is oriented to person, place,  and time. She appears well-developed and well-nourished.  HENT:  Head: Normocephalic and atraumatic.  Mouth/Throat: Oropharynx is clear and moist. No oropharyngeal exudate.  Eyes: Pupils are equal, round, and reactive to light. No scleral icterus.  Neck: Neck supple.  Left supraclavicular lymph node 1-2cm, mobile  Cardiovascular: Normal rate, regular rhythm and normal heart sounds.  Pulmonary/Chest: Effort normal and breath sounds normal.  Abdominal: Soft. Bowel sounds are normal.  Right inguinal lymph node about 2cm Right abdominal thickness noted at lower rib cage at [facility]  Musculoskeletal: She exhibits no edema.  Lymphadenopathy:    She has no cervical adenopathy.  Neurological: She is alert and oriented to person, place, and time.  Skin: Skin is warm and dry. Capillary refill takes less than 2 seconds.  Upper back on left side, very minimally palpable area unable to determine size  Psychiatric: She has a normal mood and affect.    LABORATORY DATA:  CBC    Component Value Date/Time   WBC 4.8 04/03/2018 1311   WBC 3.0 (L) 03/02/2018 1202   RBC 4.27 04/03/2018 1311   HGB 12.5 04/03/2018 1311   HGB 11.7 04/04/2017 1416   HCT 37.9 04/03/2018 1311   HCT 35.9 04/04/2017 1416   PLT 214 04/03/2018 1311   PLT 207 04/04/2017 1416   MCV 88.8 04/03/2018 1311   MCV 78.4 (L) 04/04/2017 1416   MCH 29.3 04/03/2018 1311   MCHC 33.0 04/03/2018 1311   RDW 14.2 04/03/2018 1311   RDW 12.8 04/04/2017 1416   LYMPHSABS 1.0 04/03/2018 1311   LYMPHSABS 1.2 04/04/2017 1416   MONOABS 0.5 04/03/2018 1311   MONOABS 0.4 04/04/2017 1416   EOSABS  0.1 04/03/2018 1311   EOSABS 0.0 04/04/2017 1416   BASOSABS 0.0 04/03/2018 1311   BASOSABS 0.0 04/04/2017 1416    CMP     Component Value Date/Time   NA 141 04/03/2018 1311   NA 139 04/04/2017 1416   K 3.3 (L) 04/03/2018 1311   K 3.7 04/04/2017 1416   CL 107 04/03/2018 1311   CO2 26 04/03/2018 1311   CO2 24 04/04/2017 1416   GLUCOSE 97 04/03/2018 1311   GLUCOSE 76 04/04/2017 1416   BUN 11 04/03/2018 1311   BUN 5.8 (L) 04/04/2017 1416   CREATININE 0.70 04/03/2018 1311   CREATININE 0.8 04/04/2017 1416   CALCIUM 9.2 04/03/2018 1311   CALCIUM 8.6 04/04/2017 1416   PROT 7.6 04/03/2018 1311   PROT 7.0 04/04/2017 1416   ALBUMIN 3.7 04/03/2018 1311   ALBUMIN 3.0 (L) 04/04/2017 1416   AST 13 (L) 04/03/2018 1311   AST 18 04/04/2017 1416   ALT 7 04/03/2018 1311   ALT 12 04/04/2017 1416   ALKPHOS 98 04/03/2018 1311   ALKPHOS 131 04/04/2017 1416   BILITOT 0.5 04/03/2018 1311   BILITOT 0.43 04/04/2017 1416   GFRNONAA >60 04/03/2018 1311   GFRAA >60 04/03/2018 1311      ASSESSMENT and PLAN:   Breast cancer of lower-outer quadrant of right female breast ([place]) Right breast biopsy 12/03/2014 8:00: Invasive ductal carcinoma, grade 3, ER 0%, PR 0%, 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 Neoadj chemo 12/24/14- 04/29/15 AC x 4 foll by Abraxane X 12 Rt Lumpectomy: Path CR 0/2 LN Adj XRT 07/23/15- 09/08/15   PET/CT scan 11/17/2016:Subcutaneous nodules in the neck, upper back, left arm, abdomen  and pelvis  Patient progressed on Xeloda January 2019-07/04/2017 stopped due to progression of disease  Cerebellar mass diagnosed 07/12/2016: Resection followed by stereotactic radiation 07/29/2016  MRI brain 12/17/2017: No evidence of recurrent or residual metastatic disease to the brain. Post radiation changes in the right cerebellum  CT abdomen and pelvis 12/12/2017: Mixed  response to therapy, interval decrease in the mid abdominal nodule(2.4 cm to 1.4 cm)and interval new anterior abdominal nodule (1.7 cm). Increase in right inguinal lymph node (1.1 cm to 1.3 cm)and resolution of the left supraclavicular lymph node  CT CAP:02/02/2018: Met LN Left Supraclavicular nodal station stable, Rt Inguinal LN increased with Rt inguinal lymphadenopathy, lymphadenopathy treated with radiation  MRI brain 03/24/2018 is stable, no new or recurrent metastases.  Lymph node from 03/02/2018 biopsy: PDL1+  _____________________________________________________________________________  Current treatment:Pembrolizumab given every 3 weeks starting 04/03/18  Lannette Donath and I reviewed the Pembrolizumab today and she will start this.  She is very hopeful that it will work for her.  She and I reviewed possible adverse effects, risks/benefits. I added on TSH lab orders today as well.  We reviewed her MRI results from 03/24/2018 today and I printed out a copy for her at her request.  She is very happy with the news.  We also worked together to coordinate her schedule for December.  Return to clinic in3weeks for cycle 2   Orders Placed This Encounter  Procedures  . TSH    Standing Status:   Standing    Number of Occurrences:   26    Standing Expiration Date:   04/04/2019    All questions were answered. The patient knows to call the clinic with any problems, questions or concerns. We can certainly see the patient much sooner if necessary.  A total of (30) minutes of face-to-face time was spent with this patient with greater than 50% of that time in counseling and care-coordination.  This note was electronically signed. Scot Dock, NP 04/04/2018

## 2018-04-03 ENCOUNTER — Inpatient Hospital Stay: Payer: BLUE CROSS/BLUE SHIELD

## 2018-04-03 ENCOUNTER — Encounter: Payer: Self-pay | Admitting: Adult Health

## 2018-04-03 ENCOUNTER — Inpatient Hospital Stay (HOSPITAL_BASED_OUTPATIENT_CLINIC_OR_DEPARTMENT_OTHER): Payer: BLUE CROSS/BLUE SHIELD | Admitting: Adult Health

## 2018-04-03 ENCOUNTER — Inpatient Hospital Stay: Payer: BLUE CROSS/BLUE SHIELD | Attending: Hematology and Oncology

## 2018-04-03 VITALS — BP 121/85 | HR 85 | Temp 97.6°F | Resp 16 | Wt 180.6 lb

## 2018-04-03 DIAGNOSIS — Z171 Estrogen receptor negative status [ER-]: Principal | ICD-10-CM

## 2018-04-03 DIAGNOSIS — Z5112 Encounter for antineoplastic immunotherapy: Secondary | ICD-10-CM | POA: Insufficient documentation

## 2018-04-03 DIAGNOSIS — C50511 Malignant neoplasm of lower-outer quadrant of right female breast: Secondary | ICD-10-CM | POA: Diagnosis present

## 2018-04-03 DIAGNOSIS — C7931 Secondary malignant neoplasm of brain: Secondary | ICD-10-CM | POA: Insufficient documentation

## 2018-04-03 DIAGNOSIS — C792 Secondary malignant neoplasm of skin: Secondary | ICD-10-CM

## 2018-04-03 LAB — CMP (CANCER CENTER ONLY)
ALT: 7 U/L (ref 0–44)
ANION GAP: 8 (ref 5–15)
AST: 13 U/L — ABNORMAL LOW (ref 15–41)
Albumin: 3.7 g/dL (ref 3.5–5.0)
Alkaline Phosphatase: 98 U/L (ref 38–126)
BUN: 11 mg/dL (ref 6–20)
CO2: 26 mmol/L (ref 22–32)
Calcium: 9.2 mg/dL (ref 8.9–10.3)
Chloride: 107 mmol/L (ref 98–111)
Creatinine: 0.7 mg/dL (ref 0.44–1.00)
Glucose, Bld: 97 mg/dL (ref 70–99)
POTASSIUM: 3.3 mmol/L — AB (ref 3.5–5.1)
Sodium: 141 mmol/L (ref 135–145)
Total Bilirubin: 0.5 mg/dL (ref 0.3–1.2)
Total Protein: 7.6 g/dL (ref 6.5–8.1)

## 2018-04-03 LAB — CBC WITH DIFFERENTIAL (CANCER CENTER ONLY)
ABS IMMATURE GRANULOCYTES: 0.01 10*3/uL (ref 0.00–0.07)
BASOS ABS: 0 10*3/uL (ref 0.0–0.1)
BASOS PCT: 1 %
Eosinophils Absolute: 0.1 10*3/uL (ref 0.0–0.5)
Eosinophils Relative: 1 %
HCT: 37.9 % (ref 36.0–46.0)
HEMOGLOBIN: 12.5 g/dL (ref 12.0–15.0)
Immature Granulocytes: 0 %
LYMPHS PCT: 20 %
Lymphs Abs: 1 10*3/uL (ref 0.7–4.0)
MCH: 29.3 pg (ref 26.0–34.0)
MCHC: 33 g/dL (ref 30.0–36.0)
MCV: 88.8 fL (ref 80.0–100.0)
Monocytes Absolute: 0.5 10*3/uL (ref 0.1–1.0)
Monocytes Relative: 9 %
NEUTROS ABS: 3.3 10*3/uL (ref 1.7–7.7)
NRBC: 0 % (ref 0.0–0.2)
Neutrophils Relative %: 69 %
PLATELETS: 214 10*3/uL (ref 150–400)
RBC: 4.27 MIL/uL (ref 3.87–5.11)
RDW: 14.2 % (ref 11.5–15.5)
WBC: 4.8 10*3/uL (ref 4.0–10.5)

## 2018-04-03 MED ORDER — SODIUM CHLORIDE 0.9% FLUSH
10.0000 mL | INTRAVENOUS | Status: DC | PRN
  Administered 2018-04-03: 10 mL
  Filled 2018-04-03: qty 10

## 2018-04-03 MED ORDER — SODIUM CHLORIDE 0.9 % IV SOLN
200.0000 mg | Freq: Once | INTRAVENOUS | Status: AC
  Administered 2018-04-03: 200 mg via INTRAVENOUS
  Filled 2018-04-03: qty 8

## 2018-04-03 MED ORDER — HEPARIN SOD (PORK) LOCK FLUSH 100 UNIT/ML IV SOLN
500.0000 [IU] | Freq: Once | INTRAVENOUS | Status: AC | PRN
  Administered 2018-04-03: 500 [IU]
  Filled 2018-04-03: qty 5

## 2018-04-03 MED ORDER — SODIUM CHLORIDE 0.9 % IV SOLN
Freq: Once | INTRAVENOUS | Status: AC
  Administered 2018-04-03: 15:00:00 via INTRAVENOUS
  Filled 2018-04-03: qty 250

## 2018-04-03 NOTE — Patient Instructions (Signed)
[facility] Discharge Instructions for Patients Receiving Chemotherapy  Today you received the following chemotherapy agents Keytruda  To help prevent nausea and vomiting after your treatment, we encourage you to take your nausea medication .   If you develop nausea and vomiting that is not controlled by your nausea medication, call the clinic.   BELOW ARE SYMPTOMS THAT SHOULD BE REPORTED IMMEDIATELY:  *FEVER GREATER THAN 100.5 F  *CHILLS WITH OR WITHOUT FEVER  NAUSEA AND VOMITING THAT IS NOT CONTROLLED WITH YOUR NAUSEA MEDICATION  *UNUSUAL SHORTNESS OF BREATH  *UNUSUAL BRUISING OR BLEEDING  TENDERNESS IN MOUTH AND THROAT WITH OR WITHOUT PRESENCE OF ULCERS  *URINARY PROBLEMS  *BOWEL PROBLEMS  UNUSUAL RASH Items with * indicate a potential emergency and should be followed up as soon as possible.  Feel free to call the clinic should you have any questions or concerns. The clinic phone number is [phone].  Please show the CHEMO ALERT CARD at check-in to the Emergency Department and triage nurse.   

## 2018-04-04 ENCOUNTER — Telehealth: Payer: Self-pay

## 2018-04-04 LAB — TSH: TSH: 1.822 u[IU]/mL (ref 0.308–3.960)

## 2018-04-04 NOTE — Telephone Encounter (Signed)
-----   Message from Gardenia Phlegm, NP sent at 04/03/2018  6:37 PM EST ----- Potassium slightly low, recommend increasing potassium in diet ----- Message ----- From: Interface, Lab In [place] Sent: 04/03/2018   1:41 PM EST To: [name], MD

## 2018-04-04 NOTE — Telephone Encounter (Signed)
Called patient to inform of potassium result but patient had already been made aware by another nurse at center.

## 2018-04-05 ENCOUNTER — Encounter: Payer: Self-pay | Admitting: Radiation Oncology

## 2018-04-06 ENCOUNTER — Encounter: Payer: Self-pay | Admitting: Radiation Oncology

## 2018-04-06 NOTE — Progress Notes (Signed)
  Radiation Oncology         [phone] ________________________________  Name: Kaitlyn Keith MRN: [id number]  Date: 04/06/2018  DOB: 1966-01-26  End of Treatment Note  Diagnosis:   Metastatic Triple Negative Breast Cancer     Indication for treatment:  Palliative        Radiation treatment dates:   03/20/2018-03/31/2018  Site/dose:   1. Right inguinal, 3 Gy in 10 fractions for a total dose of 30 Gy           2. SCV_Lt CT, 3 Gy in 10 fractions for a total dose of 30 Gy           3. Abdomen, 2.5 Gy in 8 fractions for a total dose of 20 Gy  Beams/energy:   1. Electron//En Face, 15E, 12E         2. Isodose plan, 10X         3. Isodose plan, 6X, 10X  Narrative: The patient tolerated radiation treatment relatively well. At the beginning of treatment, pt reported fatigue. Towards the end of her treatments, she reported moderate fatigue. She denied pain, nausea, vomiting, diarrhea, dysuria, vaginal discharge, rectal bleeding, appetite change, skin issues, CP, Ha, numbness, or tingling. Pain in all areas improved with radiation therapy.  Plan: The patient has completed radiation treatment. The patient will return to radiation oncology clinic for routine followup in one month. I advised them to call or return sooner if they have any questions or concerns related to their recovery or treatment.  -----------------------------------  Blair Promise, PhD, MD  This document serves as a record of services personally performed by [name], MD. It was created on his behalf by [facility], a trained medical scribe. The creation of this record is based on the scribe's personal observations and the provider's statements to them. This document has been checked and approved by the attending provider.

## 2018-04-10 ENCOUNTER — Other Ambulatory Visit: Payer: BLUE CROSS/BLUE SHIELD

## 2018-04-10 ENCOUNTER — Ambulatory Visit: Payer: BLUE CROSS/BLUE SHIELD

## 2018-04-24 ENCOUNTER — Ambulatory Visit: Payer: BLUE CROSS/BLUE SHIELD | Admitting: Adult Health

## 2018-04-24 ENCOUNTER — Inpatient Hospital Stay (HOSPITAL_BASED_OUTPATIENT_CLINIC_OR_DEPARTMENT_OTHER): Payer: BLUE CROSS/BLUE SHIELD | Admitting: Hematology and Oncology

## 2018-04-24 ENCOUNTER — Ambulatory Visit: Payer: BLUE CROSS/BLUE SHIELD

## 2018-04-24 ENCOUNTER — Telehealth: Payer: Self-pay | Admitting: Hematology and Oncology

## 2018-04-24 ENCOUNTER — Other Ambulatory Visit: Payer: BLUE CROSS/BLUE SHIELD

## 2018-04-24 ENCOUNTER — Encounter: Payer: Self-pay | Admitting: *Deleted

## 2018-04-24 ENCOUNTER — Inpatient Hospital Stay: Payer: BLUE CROSS/BLUE SHIELD

## 2018-04-24 ENCOUNTER — Inpatient Hospital Stay: Payer: BLUE CROSS/BLUE SHIELD | Attending: Hematology and Oncology

## 2018-04-24 DIAGNOSIS — Z79899 Other long term (current) drug therapy: Secondary | ICD-10-CM | POA: Insufficient documentation

## 2018-04-24 DIAGNOSIS — Z171 Estrogen receptor negative status [ER-]: Secondary | ICD-10-CM

## 2018-04-24 DIAGNOSIS — E876 Hypokalemia: Secondary | ICD-10-CM

## 2018-04-24 DIAGNOSIS — Z5112 Encounter for antineoplastic immunotherapy: Secondary | ICD-10-CM | POA: Diagnosis not present

## 2018-04-24 DIAGNOSIS — E039 Hypothyroidism, unspecified: Secondary | ICD-10-CM | POA: Diagnosis not present

## 2018-04-24 DIAGNOSIS — C50511 Malignant neoplasm of lower-outer quadrant of right female breast: Secondary | ICD-10-CM

## 2018-04-24 DIAGNOSIS — C7931 Secondary malignant neoplasm of brain: Secondary | ICD-10-CM | POA: Insufficient documentation

## 2018-04-24 DIAGNOSIS — C792 Secondary malignant neoplasm of skin: Secondary | ICD-10-CM

## 2018-04-24 LAB — CBC WITH DIFFERENTIAL (CANCER CENTER ONLY)
ABS IMMATURE GRANULOCYTES: 0.01 10*3/uL (ref 0.00–0.07)
Basophils Absolute: 0 10*3/uL (ref 0.0–0.1)
Basophils Relative: 1 %
EOS ABS: 0.1 10*3/uL (ref 0.0–0.5)
Eosinophils Relative: 2 %
HCT: 36.5 % (ref 36.0–46.0)
Hemoglobin: 11.7 g/dL — ABNORMAL LOW (ref 12.0–15.0)
IMMATURE GRANULOCYTES: 0 %
LYMPHS ABS: 1.1 10*3/uL (ref 0.7–4.0)
Lymphocytes Relative: 33 %
MCH: 28.3 pg (ref 26.0–34.0)
MCHC: 32.1 g/dL (ref 30.0–36.0)
MCV: 88.4 fL (ref 80.0–100.0)
MONOS PCT: 11 %
Monocytes Absolute: 0.4 10*3/uL (ref 0.1–1.0)
NEUTROS ABS: 1.9 10*3/uL (ref 1.7–7.7)
NEUTROS PCT: 53 %
NRBC: 0 % (ref 0.0–0.2)
PLATELETS: 139 10*3/uL — AB (ref 150–400)
RBC: 4.13 MIL/uL (ref 3.87–5.11)
RDW: 13.2 % (ref 11.5–15.5)
WBC Count: 3.4 10*3/uL — ABNORMAL LOW (ref 4.0–10.5)

## 2018-04-24 LAB — CMP (CANCER CENTER ONLY)
ALT: 12 U/L (ref 0–44)
AST: 15 U/L (ref 15–41)
Albumin: 3.5 g/dL (ref 3.5–5.0)
Alkaline Phosphatase: 97 U/L (ref 38–126)
Anion gap: 8 (ref 5–15)
BUN: 16 mg/dL (ref 6–20)
CO2: 27 mmol/L (ref 22–32)
CREATININE: 0.74 mg/dL (ref 0.44–1.00)
Calcium: 9.1 mg/dL (ref 8.9–10.3)
Chloride: 107 mmol/L (ref 98–111)
GFR, Est AFR Am: >60 mL/min (ref >60)
GLUCOSE: 94 mg/dL (ref 70–99)
Potassium: 3.9 mmol/L (ref 3.5–5.1)
Sodium: 142 mmol/L (ref 135–145)
Total Bilirubin: 0.4 mg/dL (ref 0.3–1.2)
Total Protein: 7.2 g/dL (ref 6.5–8.1)

## 2018-04-24 LAB — TSH

## 2018-04-24 MED ORDER — SODIUM CHLORIDE 0.9 % IV SOLN
Freq: Once | INTRAVENOUS | Status: AC
  Administered 2018-04-24: 11:00:00 via INTRAVENOUS
  Filled 2018-04-24: qty 250

## 2018-04-24 MED ORDER — SODIUM CHLORIDE 0.9% FLUSH
10.0000 mL | INTRAVENOUS | Status: DC | PRN
  Administered 2018-04-24: 10 mL
  Filled 2018-04-24: qty 10

## 2018-04-24 MED ORDER — HEPARIN SOD (PORK) LOCK FLUSH 100 UNIT/ML IV SOLN
500.0000 [IU] | Freq: Once | INTRAVENOUS | Status: AC | PRN
  Administered 2018-04-24: 500 [IU]
  Filled 2018-04-24: qty 5

## 2018-04-24 MED ORDER — SODIUM CHLORIDE 0.9 % IV SOLN
200.0000 mg | Freq: Once | INTRAVENOUS | Status: AC
  Administered 2018-04-24: 200 mg via INTRAVENOUS
  Filled 2018-04-24: qty 8

## 2018-04-24 NOTE — Patient Instructions (Signed)
[facility] Discharge Instructions for Patients Receiving Chemotherapy  Today you received the following chemotherapy agents Keytruda  To help prevent nausea and vomiting after your treatment, we encourage you to take your nausea medication .   If you develop nausea and vomiting that is not controlled by your nausea medication, call the clinic.   BELOW ARE SYMPTOMS THAT SHOULD BE REPORTED IMMEDIATELY:  *FEVER GREATER THAN 100.5 F  *CHILLS WITH OR WITHOUT FEVER  NAUSEA AND VOMITING THAT IS NOT CONTROLLED WITH YOUR NAUSEA MEDICATION  *UNUSUAL SHORTNESS OF BREATH  *UNUSUAL BRUISING OR BLEEDING  TENDERNESS IN MOUTH AND THROAT WITH OR WITHOUT PRESENCE OF ULCERS  *URINARY PROBLEMS  *BOWEL PROBLEMS  UNUSUAL RASH Items with * indicate a potential emergency and should be followed up as soon as possible.  Feel free to call the clinic should you have any questions or concerns. The clinic phone number is [phone].  Please show the CHEMO ALERT CARD at check-in to the Emergency Department and triage nurse.   

## 2018-04-24 NOTE — Progress Notes (Signed)
Patient Care Team: [name], MD as PCP - General (Hematology and Oncology) [name], [name], [name], [name], [name], [name], NP (Inactive) as Nurse Practitioner (Nurse Practitioner)  DIAGNOSIS:  Encounter Diagnosis  Name Primary?  . 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, estrogen receptor negative ([place])     SUMMARY OF ONCOLOGIC HISTORY:   Breast cancer of lower-outer quadrant of right female breast ([place])   12/03/2014 Mammogram    Right breast mass 1.9 cm it o'clock position 8 cm depth from the nipple    12/03/2014 Initial Diagnosis    Right breast biopsy 8:00: Invasive ductal carcinoma, grade 3, ER 0%, PR 0%, Ki-67 90%, HER-2 negative ratio 1.43    12/10/2014 Breast MRI    Right breast lower outer quadrant: 2.3 x 2.4 x 2.4 cm rim-enhancing mass abuts the pectoralis fascia but no enhancement of pectoralis muscle, second focus of artifact?'s second tissue marker clip, no lymph nodes    12/10/2014 Clinical Stage    Stage IIA: T2 N0    12/24/2014 - 04/29/2015 Neo-Adjuvant Chemotherapy    Dose dense Adriamycin and Cytoxan 4 followed by weekly Abraxane 12    05/02/2015 Breast MRI    complete radiologic response    06/16/2015 Surgery    Left Lumpectomy: Complete path Response, 0/2 LN    06/16/2015 Pathologic Stage    ypT0 ypN0    07/23/2015 - 09/05/2015 Radiation Therapy    Adjuvant RT: 50.4 Gy in 28 fractions and a boost of 10 Gy in 5 fractions to total dose of 60.4 Gy    10/24/2015 Survivorship    SCP mailed to patient in lieu of in person visit.    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 identified in BARD 1, BRCA2 and NF1    12/31/2016 Miscellaneous    Everolimus 10 mg daily for cycle 1 if she cannot tolerate will decrease to 7.5 mg daily    05/24/2017 - 07/04/2017 Chemotherapy    Xeloda 2000 mg 2 weeks on 1 week off stopped due to progression of disease based on CT scans done 06/27/2017    07/25/2017 - 03/13/2018 Chemotherapy    [place] days 1 and 8 every 3 weeks stopped for progression     03/20/2018 - 03/31/2018 Radiation Therapy    Radiation to lymph nodes    04/03/2018 -  Chemotherapy    Keytruda every 3 weeks      CHIEF COMPLIANT: Follow-up on Keytruda cycle 2  INTERVAL HISTORY: Kaitlyn Keith is a 52 year old with above-mentioned history metastatic breast cancer triple  negative disease who is currently on Keytruda.  She tolerated cycle 1 extremely well.  Apart from mild fatigue she did well.  Fatigue could be related to her Thanksgiving preparation and festivities with her family.  She does not have any other side effects.  She had a mild rash which resolved.  REVIEW OF SYSTEMS:   Constitutional: Denies fevers, chills or abnormal weight loss Eyes: Denies blurriness of vision Ears, nose, mouth, throat, and face: Denies mucositis or sore throat Respiratory: Denies cough, dyspnea or wheezes Cardiovascular:  Denies palpitation, chest discomfort Gastrointestinal:  Denies nausea, heartburn or change in bowel habits Skin: Denies abnormal skin rashes Lymphatics: Denies new lymphadenopathy or easy bruising Neurological:Denies numbness, tingling or new weaknesses Behavioral/Psych: Mood is stable, no new changes  Extremities: No lower extremity edema   All other systems were reviewed with the patient and are negative.  I have reviewed the past medical history, past surgical history, social history and family history with the patient and they are unchanged from previous note.  ALLERGIES:  has No Known Allergies.  MEDICATIONS:  Current Outpatient Medications  Medication Sig Dispense Refill  . ALPRAZolam (XANAX) 1 MG tablet Take 2 tablets (2 mg total) by mouth at bedtime. 60 tablet 1  . cyclobenzaprine (FLEXERIL) 5 MG tablet Take 1 tablet (5 mg total) by mouth 3 (three) times daily as needed for muscle spasms. 30 tablet 0  . gabapentin (NEURONTIN) 100 MG capsule Take 1 capsule (100 mg total) by mouth at bedtime. 30 capsule 6  . HYDROcodone-acetaminophen (NORCO/VICODIN) 5-325 MG tablet Take 1-2 tablets by mouth every 6 (six) hours as needed for moderate pain or severe pain. 15 tablet 0  . lidocaine-prilocaine (EMLA) cream APPLY TO AFFECTED AREA ONCE AS DIRECTED  3  . lisinopril-hydrochlorothiazide (PRINZIDE,ZESTORETIC) 20-12.5 MG tablet TAKE 1 TABLET BY MOUTH TWICE DAILY 180 tablet 0   No current facility-administered medications for this visit.     PHYSICAL EXAMINATION: ECOG PERFORMANCE STATUS: 1 - Symptomatic but completely ambulatory  Vitals:   04/24/18 1004  BP: 125/86  Pulse: 71  Resp: 18  Temp: 98.3 F (36.8 C)  SpO2: 100%   Filed Weights   04/24/18 1004  Weight: 177 lb 6.4 oz (80.5 kg)    GENERAL:alert, no distress and comfortable SKIN: skin color, texture, turgor are normal, no rashes or significant lesions EYES: normal, Conjunctiva are pink and non-injected, sclera  clear OROPHARYNX:no exudate, no erythema and lips, buccal mucosa, and tongue normal  NECK: supple, thyroid normal size, non-tender, without nodularity LYMPH:  no palpable lymphadenopathy in the cervical, axillary or inguinal LUNGS: clear to auscultation and percussion with normal breathing effort HEART: regular rate & rhythm and no murmurs and no lower extremity edema ABDOMEN:abdomen soft, non-tender and normal bowel sounds MUSCULOSKELETAL:no cyanosis of digits and no clubbing  NEURO: alert & oriented x 3 with fluent speech, no focal motor/sensory deficits EXTREMITIES: No lower extremity edema   LABORATORY DATA:  I have reviewed the data as listed CMP Latest Ref Rng & Units 04/03/2018 03/13/2018 02/27/2018  Glucose 70 - 99 mg/dL 97 83 84  BUN 6 - 20 mg/dL '11 11 15  ' Creatinine 0.44 - 1.00 mg/dL 0.70 0.70 0.70  Sodium 135 - 145 mmol/L 141 140 141  Potassium 3.5 - 5.1 mmol/L 3.3(L) 3.6 3.4(L)  Chloride 98 - 111 mmol/L 107 108 105  CO2 22 - 32 mmol/L '26 26 27  ' Calcium 8.9 - 10.3 mg/dL 9.2 9.1 9.4  Total Protein 6.5 - 8.1 g/dL 7.6  7.3 7.7  Total Bilirubin 0.3 - 1.2 mg/dL 0.5 0.6 0.6  Alkaline Phos 38 - 126 U/L 98 102 100  AST 15 - 41 U/L 13(L) 15 17  ALT 0 - 44 U/L '7 11 12    ' Lab Results  Component Value Date   WBC 3.4 (L) 04/24/2018   HGB 11.7 (L) 04/24/2018   HCT 36.5 04/24/2018   MCV 88.4 04/24/2018   PLT 139 (L) 04/24/2018   NEUTROABS 1.9 04/24/2018    ASSESSMENT & PLAN:  Breast cancer of lower-outer quadrant of right female breast ([place]) Right breast biopsy 12/03/2014 8:00: Invasive ductal carcinoma, grade 3, ER 0%, PR 0%, 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 Neoadj chemo 12/24/14- 04/29/15 AC x 4 foll by Abraxane X 12 Rt Lumpectomy: Path CR 0/2 LN Adj XRT 07/23/15- 09/08/15   PET/CT scan 11/17/2016:Subcutaneous nodules in the neck, upper back, left  arm, abdomen and pelvis  Patient progressed on Xeloda January 2019-07/04/2017 stopped due to progression of disease  Cerebellar mass diagnosed 07/12/2016: Resection followed by stereotactic radiation 07/29/2016  MRI brain 12/17/2017: No evidence of recurrent or residual metastatic disease to the brain. Post radiation changes in the right cerebellum  CT abdomen and pelvis 12/12/2017: Mixed response to therapy, interval decrease in the mid abdominal nodule(2.4 cm to 1.4 cm)and interval new anterior abdominal nodule (1.7 cm). Increase in right inguinal lymph node (1.1 cm to 1.3 cm)and resolution of the left supraclavicular lymph node  CT CAP:02/02/2018: Met LN Left Supraclavicular nodal station stable, Rt Inguinal LN increased with Rt inguinal lymphadenopathy, lymphadenopathy treated with radiation  MRI brain 03/24/2018 is stable, no new or recurrent metastases.  Lymph node from 03/02/2018 biopsy: PDL1+ _____________________________________________________________________________  Current treatment:Pembrolizumab given every 3 weeks starting 04/03/18  Keytruda toxicities: Other than fatigue she is doing extremely well Currently on potassium replacement for hypokalemia  We will need to monitor closely for immune mediated adverse effects We will be checking thyroid function  Return to clinic every 3 weeks for Keytruda     No orders of the defined types were placed in this encounter.  The patient has a good understanding of the overall plan. she agrees with it. she will call with any problems that may develop before the next visit here.   Harriette Ohara, MD 04/24/18

## 2018-04-24 NOTE — Telephone Encounter (Signed)
12/2 los.  Mailed calendar to patient.

## 2018-04-24 NOTE — Assessment & Plan Note (Signed)
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 Neoadj chemo 12/24/14- 04/29/15 AC x 4 foll by Abraxane X 12 Rt Lumpectomy: Path CR 0/2 LN Adj XRT 07/23/15- 09/08/15   PET/CT scan 11/17/2016:Subcutaneous nodules in the neck, upper back, left arm, abdomen and pelvis  Patient progressed on Xeloda January 2019-07/04/2017 stopped due to progression of disease  Cerebellar mass diagnosed 07/12/2016: Resection followed by stereotactic radiation 07/29/2016  MRI brain 12/17/2017: No evidence of recurrent or residual metastatic disease to the brain. Post radiation changes in the right cerebellum  CT abdomen and pelvis 12/12/2017: Mixed response to therapy, interval decrease in the mid abdominal nodule(2.4 cm to 1.4 cm)and interval new anterior abdominal nodule (1.7 cm). Increase in right inguinal lymph node (1.1 cm to 1.3 cm)and resolution of the left supraclavicular lymph node  CT CAP:02/02/2018: Met LN Left Supraclavicular nodal station stable, Rt Inguinal LN increased with Rt inguinal lymphadenopathy, lymphadenopathy treated with radiation  MRI brain 03/24/2018 is stable, no new or recurrent metastases.  Lymph node from 03/02/2018 biopsy: PDL1+  _____________________________________________________________________________  Current treatment:Pembrolizumab given every 3 weeks starting 04/03/18  Keytruda toxicities:  We will need to monitor closely for immune mediated adverse effects We will be checking thyroid function  Return to clinic every 3 weeks for [facility]

## 2018-04-24 NOTE — Telephone Encounter (Signed)
Per 12/2 los, tx plan, added appt for 1/9 due to availability with provider.  Mailed calendar.

## 2018-05-01 ENCOUNTER — Ambulatory Visit: Payer: Self-pay | Admitting: Radiation Oncology

## 2018-05-01 ENCOUNTER — Other Ambulatory Visit: Payer: BLUE CROSS/BLUE SHIELD

## 2018-05-01 ENCOUNTER — Ambulatory Visit: Payer: BLUE CROSS/BLUE SHIELD

## 2018-05-04 ENCOUNTER — Ambulatory Visit
Admission: RE | Admit: 2018-05-04 | Discharge: 2018-05-04 | Disposition: A | Discharge status: Home / Self Care | Payer: BLUE CROSS/BLUE SHIELD | Source: Ambulatory Visit | Attending: Radiation Oncology | Admitting: Radiation Oncology

## 2018-05-04 ENCOUNTER — Other Ambulatory Visit: Payer: Self-pay

## 2018-05-04 ENCOUNTER — Encounter: Payer: Self-pay | Admitting: Radiation Oncology

## 2018-05-04 VITALS — BP 135/93 | HR 51 | Temp 98.1°F | Resp 18 | Ht 63.0 in | Wt 179.6 lb

## 2018-05-04 DIAGNOSIS — Z171 Estrogen receptor negative status [ER-]: Secondary | ICD-10-CM | POA: Diagnosis not present

## 2018-05-04 DIAGNOSIS — Z79899 Other long term (current) drug therapy: Secondary | ICD-10-CM | POA: Insufficient documentation

## 2018-05-04 DIAGNOSIS — C50511 Malignant neoplasm of lower-outer quadrant of right female breast: Secondary | ICD-10-CM | POA: Diagnosis present

## 2018-05-04 DIAGNOSIS — C792 Secondary malignant neoplasm of skin: Secondary | ICD-10-CM

## 2018-05-04 NOTE — Progress Notes (Signed)
Radiation Oncology         [phone] ________________________________  Name: Kaitlyn Keith MRN: [id number]  Date: 05/04/2018  DOB: 1966/04/09  Follow-Up Visit Note  CC: [name], MD  Ditty, Kevan Ny, *    ICD-10-CM   1. 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, estrogen receptor negative ([place]) C50.511    Z17.1   2. 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 Pelvis / 30 Gy in 10 fractions (right inguinal nodes) 2. Left Supraclavicular / 30 Gy in 10 fractions 3. Abdomen / 20 Gy in 8 fractions (subcutaneous Met)  07/11/17 - 07/22/17: 1. Left Flank / 30 Gy in 10 fractions 2. Left Groin / 30 Gy in 10 fractions  07/26/16: PTV1 Right Cerebellum / 18 Gy in 1 fraction  07/23/15 - 09/05/15: Right Breast, with Boost / 50.4+10 Gy in 28+5 fractions  Narrative:  The patient returns today for routine follow-up. She is doing well overall. She reports fatigue related to her first infusion of Pembro/Keytruda on 04/03/18. She states her skin at the radiation sites is dry. She also reports mild pain in right hip/groin area, especially when she crosses her legs.  The pain in the left supraclavicular region or left flank                         ALLERGIES:  has No Known Allergies.  Meds: Current Outpatient Medications  Medication Sig Dispense Refill  . ALPRAZolam (XANAX) 1 MG tablet Take 2 tablets (2 mg total) by mouth at bedtime. 60 tablet 1  . lidocaine-prilocaine (EMLA) cream APPLY TO AFFECTED AREA ONCE AS DIRECTED  3  . lisinopril-hydrochlorothiazide (PRINZIDE,ZESTORETIC) 20-12.5 MG tablet TAKE 1 TABLET BY MOUTH TWICE DAILY 180 tablet 0  . cyclobenzaprine (FLEXERIL) 5 MG tablet Take 1 tablet (5 mg total) by mouth 3 (three) times daily as needed for muscle spasms. (Patient not taking: Reported on 05/04/2018) 30 tablet 0  .  gabapentin (NEURONTIN) 100 MG capsule Take 1 capsule (100 mg total) by mouth at bedtime. (Patient not taking: Reported on 05/04/2018) 30 capsule 6  . HYDROcodone-acetaminophen (NORCO/VICODIN) 5-325 MG tablet Take 1-2 tablets by mouth every 6 (six) hours as needed for moderate pain or severe pain. (Patient not taking: Reported on 05/04/2018) 15 tablet 0   No current facility-administered medications for this encounter.     Physical Findings: The patient is in no acute distress. Patient is alert and oriented.  height is '5\' 3"'$  (1.6 m) and weight is 179 lb 9.6 oz (81.5 kg). Her oral temperature is 98.1 F (36.7 C). Her blood pressure is 135/93 (abnormal) and her pulse is 51 (abnormal). Her respiration is 18 and oxygen saturation is 100%.   No significant changes. Lungs are clear to auscultation bilaterally. Heart has regular rate and rhythm. No palpable cervical, supraclavicular, or axillary adenopathy. Abdomen soft, non-tender, normal bowel sounds. No palpable lymph nodes to left supraclavicular and along her left flank area. Hyperpigmentation noted in these areas.   Lab Findings: Lab Results  Component Value Date   WBC 3.4 (L) 04/24/2018   HGB 11.7 (L) 04/24/2018   HCT 36.5 04/24/2018   MCV 88.4 04/24/2018   PLT 139 (L) 04/24/2018    Radiographic Findings: No results found.  Impression:  Metastatic Triple Negative Breast Cancer The  patient is recovering from the effects of radiation. Clinically stable. Patient has had good improvement in her treated lymph nodes as well as cutaneous metastasis with tumor shrinkage and pain improvement.  Plan:  PRN follow up in radiation oncology. She will continue with medical oncology for close follow up and Keytruda treatments.  -----------------------------------  Blair Promise, PhD, MD  This document serves as a record of services personally performed by [name], MD. It was created on his behalf by Wilburn Mylar, a trained medical  scribe. The creation of this record is based on the scribe's personal observations and the provider's statements to them. This document has been checked and approved by the attending provider.

## 2018-05-04 NOTE — Progress Notes (Signed)
Pt presents today for f/u with Dr. Sondra Come. Pt reports mild fatigue that is improving since radiation completed. Pt reports mild pain in right hip/groin area, especially when pt crosses legs. Pt reports dry skin in radiation areas.   BP (!) 135/93 (BP Location: Left Arm)   Pulse (!) 51   Temp 98.1 F (36.7 C) (Oral)   Resp 18   Ht 5\' 3"  (1.6 m)   Wt 179 lb 9.6 oz (81.5 kg)   SpO2 100%   BMI 31.81 kg/m   Wt Readings from Last 3 Encounters:  05/04/18 179 lb 9.6 oz (81.5 kg)  04/24/18 177 lb 6.4 oz (80.5 kg)  04/03/18 180 lb 9 oz (81.9 kg)   Loma Sousa, RN BSN

## 2018-05-12 ENCOUNTER — Inpatient Hospital Stay (HOSPITAL_BASED_OUTPATIENT_CLINIC_OR_DEPARTMENT_OTHER): Payer: BLUE CROSS/BLUE SHIELD | Admitting: Hematology and Oncology

## 2018-05-12 ENCOUNTER — Inpatient Hospital Stay: Payer: BLUE CROSS/BLUE SHIELD

## 2018-05-12 ENCOUNTER — Other Ambulatory Visit: Payer: Self-pay

## 2018-05-12 VITALS — BP 153/105 | HR 59

## 2018-05-12 DIAGNOSIS — Z171 Estrogen receptor negative status [ER-]: Principal | ICD-10-CM

## 2018-05-12 DIAGNOSIS — C50511 Malignant neoplasm of lower-outer quadrant of right female breast: Secondary | ICD-10-CM

## 2018-05-12 DIAGNOSIS — C7931 Secondary malignant neoplasm of brain: Secondary | ICD-10-CM | POA: Diagnosis not present

## 2018-05-12 DIAGNOSIS — E039 Hypothyroidism, unspecified: Secondary | ICD-10-CM

## 2018-05-12 DIAGNOSIS — E876 Hypokalemia: Secondary | ICD-10-CM

## 2018-05-12 DIAGNOSIS — Z79899 Other long term (current) drug therapy: Secondary | ICD-10-CM

## 2018-05-12 DIAGNOSIS — Z5112 Encounter for antineoplastic immunotherapy: Secondary | ICD-10-CM | POA: Diagnosis not present

## 2018-05-12 DIAGNOSIS — C792 Secondary malignant neoplasm of skin: Secondary | ICD-10-CM

## 2018-05-12 LAB — CBC WITH DIFFERENTIAL (CANCER CENTER ONLY)
ABS IMMATURE GRANULOCYTES: 0.01 10*3/uL (ref 0.00–0.07)
BASOS ABS: 0 10*3/uL (ref 0.0–0.1)
Basophils Relative: 1 %
Eosinophils Absolute: 0.1 10*3/uL (ref 0.0–0.5)
Eosinophils Relative: 4 %
HEMATOCRIT: 39.9 % (ref 36.0–46.0)
Hemoglobin: 12.9 g/dL (ref 12.0–15.0)
IMMATURE GRANULOCYTES: 0 %
LYMPHS ABS: 1.4 10*3/uL (ref 0.7–4.0)
LYMPHS PCT: 37 %
MCH: 28 pg (ref 26.0–34.0)
MCHC: 32.3 g/dL (ref 30.0–36.0)
MCV: 86.6 fL (ref 80.0–100.0)
Monocytes Absolute: 0.3 10*3/uL (ref 0.1–1.0)
Monocytes Relative: 9 %
NEUTROS ABS: 1.9 10*3/uL (ref 1.7–7.7)
NEUTROS PCT: 49 %
NRBC: 0 % (ref 0.0–0.2)
Platelet Count: 182 10*3/uL (ref 150–400)
RBC: 4.61 MIL/uL (ref 3.87–5.11)
RDW: 13.4 % (ref 11.5–15.5)
WBC Count: 3.9 10*3/uL — ABNORMAL LOW (ref 4.0–10.5)

## 2018-05-12 LAB — CMP (CANCER CENTER ONLY)
ALT: 18 U/L (ref 0–44)
AST: 23 U/L (ref 15–41)
Albumin: 3.7 g/dL (ref 3.5–5.0)
Alkaline Phosphatase: 93 U/L (ref 38–126)
Anion gap: 7 (ref 5–15)
BUN: 10 mg/dL (ref 6–20)
CHLORIDE: 105 mmol/L (ref 98–111)
CO2: 26 mmol/L (ref 22–32)
CREATININE: 0.76 mg/dL (ref 0.44–1.00)
Calcium: 8.9 mg/dL (ref 8.9–10.3)
GFR, Estimated: >60 mL/min (ref >60)
Glucose, Bld: 82 mg/dL (ref 70–99)
Potassium: 3.8 mmol/L (ref 3.5–5.1)
SODIUM: 138 mmol/L (ref 135–145)
Total Bilirubin: 0.5 mg/dL (ref 0.3–1.2)
Total Protein: 7.4 g/dL (ref 6.5–8.1)

## 2018-05-12 LAB — TSH: TSH: 76.831 u[IU]/mL — AB (ref 0.308–3.960)

## 2018-05-12 MED ORDER — BETAMETHASONE VALERATE 0.1 % EX OINT: 1.0000 "application " | TOPICAL_OINTMENT | Freq: Two times a day (BID) | CUTANEOUS | 0 refills | Status: DC

## 2018-05-12 MED ORDER — SODIUM CHLORIDE 0.9% FLUSH
10.0000 mL | INTRAVENOUS | Status: DC | PRN
  Administered 2018-05-12: 10 mL
  Filled 2018-05-12: qty 10

## 2018-05-12 MED ORDER — HEPARIN SOD (PORK) LOCK FLUSH 100 UNIT/ML IV SOLN
500.0000 [IU] | Freq: Once | INTRAVENOUS | Status: AC | PRN
  Administered 2018-05-12: 500 [IU]
  Filled 2018-05-12: qty 5

## 2018-05-12 MED ORDER — LEVOTHYROXINE SODIUM 75 MCG PO TABS: 75.0000 ug | ORAL_TABLET | Freq: Every day | ORAL | 1 refills | Status: DC

## 2018-05-12 MED ORDER — SODIUM CHLORIDE 0.9 % IV SOLN
200.0000 mg | Freq: Once | INTRAVENOUS | Status: AC
  Administered 2018-05-12: 200 mg via INTRAVENOUS
  Filled 2018-05-12: qty 8

## 2018-05-12 MED ORDER — SODIUM CHLORIDE 0.9 % IV SOLN
Freq: Once | INTRAVENOUS | Status: AC
  Administered 2018-05-12: 12:00:00 via INTRAVENOUS
  Filled 2018-05-12: qty 250

## 2018-05-12 MED ORDER — LISINOPRIL-HYDROCHLOROTHIAZIDE 20-12.5 MG PO TABS: 1.0000 | ORAL_TABLET | Freq: Two times a day (BID) | ORAL | 3 refills | Status: DC

## 2018-05-12 NOTE — Progress Notes (Signed)
 Patient Care Team: Gudena, Vinay, MD as PCP - General (Hematology and Oncology) Toth, Paul III, MD as Consulting Physician (General Surgery) Gudena, Vinay, MD as Consulting Physician (Hematology and Oncology) Kinard, James, MD as Consulting Physician (Radiation Oncology) Stuart, Dawn C, RN as Registered Nurse Martini, Keisha N, RN as Registered Nurse Dawson, Gretchen W, NP (Inactive) as Nurse Practitioner (Nurse Practitioner)  DIAGNOSIS:  Encounter Diagnoses  Name Primary?  . 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, estrogen receptor negative (HCC)   . Breast cancer of lower-outer quadrant of right female breast (HCC)     SUMMARY OF ONCOLOGIC HISTORY:   Breast cancer of lower-outer quadrant of right female breast (HCC)   12/03/2014 Mammogram    Right breast mass 1.9 cm it o'clock position 8 cm depth from the nipple    12/03/2014 Initial Diagnosis    Right breast biopsy 8:00: Invasive ductal carcinoma, grade 3, ER 0%, PR 0%, Ki-67 90%, HER-2 negative ratio 1.43    12/10/2014 Breast MRI    Right breast lower outer quadrant: 2.3 x 2.4 x 2.4 cm rim-enhancing mass abuts the pectoralis fascia but no enhancement of pectoralis muscle, second focus of artifact?'s second tissue marker clip, no lymph nodes    12/10/2014 Clinical Stage    Stage IIA: T2 N0    12/24/2014 - 04/29/2015 Neo-Adjuvant Chemotherapy    Dose dense Adriamycin and Cytoxan 4 followed by weekly Abraxane 12    05/02/2015 Breast MRI    complete radiologic response    06/16/2015 Surgery    Left Lumpectomy: Complete path Response, 0/2 LN    06/16/2015 Pathologic Stage    ypT0 ypN0    07/23/2015 - 09/05/2015 Radiation Therapy    Adjuvant RT: 50.4 Gy in 28 fractions and a boost of 10 Gy in 5 fractions to total dose of 60.4 Gy    10/24/2015 Survivorship    SCP mailed to patient in lieu of in person visit.    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 identified in BARD 1, BRCA2 and NF1    12/31/2016 Miscellaneous    Everolimus 10 mg daily for cycle 1 if she cannot tolerate will decrease to 7.5 mg daily    05/24/2017 - 07/04/2017 Chemotherapy    Xeloda 2000 mg 2 weeks on 1 week off stopped due to progression of disease based on CT scans done 06/27/2017    07/25/2017 - 03/13/2018 Chemotherapy    [place] days 1 and 8 every 3 weeks stopped for progression     03/20/2018 - 03/31/2018 Radiation Therapy    Radiation to lymph nodes    04/03/2018 -  Chemotherapy    Keytruda every 3 weeks      CHIEF COMPLIANT: Cycle 3 Keytruda  INTERVAL HISTORY: Kaitlyn Keith is a 52 year old with above-mentioned history of metastatic breast cancer currently on immunotherapy and today cycle 3 of Keytruda.  She has tolerated the treatment fairly well.  She has good energy levels.  She has developed a rash on the neck today.  Does not have any diarrhea or any other toxicities.  Previous blood work had shown hyperthyroidism with a TSH that is very low.  Based on guidelines we are watching and monitor this.  She has good energy levels.  She has difficulty falling asleep.  Tells me that she is using complementary treatments and going to a  holistic doctor in [place].  REVIEW OF SYSTEMS:   Constitutional: Denies fevers, chills or abnormal weight loss Eyes: Denies blurriness of vision Ears, nose, mouth, throat, and face: Denies mucositis or sore throat Respiratory: Denies cough, dyspnea or wheezes Cardiovascular: Denies palpitation, chest discomfort Gastrointestinal:  Denies nausea, heartburn or change in bowel habits Skin: Rash on the neck that started this morning Lymphatics: Denies new lymphadenopathy or easy bruising Neurological:Denies numbness, tingling or new weaknesses Behavioral/Psych: Mood is stable, no new changes  Extremities: No lower extremity edema   All other systems were reviewed with the patient and are negative.  I have reviewed the past medical history, past surgical history, social history and family history with the patient and they are unchanged from previous note.  ALLERGIES:  has No Known Allergies.  MEDICATIONS:  Current Outpatient Medications  Medication Sig Dispense Refill  . ALPRAZolam (XANAX) 1 MG tablet Take 2 tablets (2 mg total) by mouth at bedtime. 60 tablet 1  . betamethasone valerate ointment (VALISONE) 0.1 % Apply 1 application topically 2 (two) times daily. 30 g 0  . cyclobenzaprine (FLEXERIL) 5 MG tablet Take 1 tablet (5 mg total) by mouth 3 (three) times daily as needed for muscle spasms. (Patient not taking: Reported on 05/04/2018) 30 tablet 0  . gabapentin (NEURONTIN) 100 MG capsule Take 1 capsule (100 mg total) by mouth at bedtime. (Patient not taking: Reported on 05/04/2018) 30 capsule 6  . HYDROcodone-acetaminophen (NORCO/VICODIN) 5-325 MG tablet Take 1-2 tablets by mouth every 6 (six) hours as needed for moderate pain or severe pain. (Patient not taking: Reported on 05/04/2018) 15 tablet 0  . lidocaine-prilocaine (EMLA) cream APPLY TO AFFECTED AREA ONCE AS DIRECTED  3  . lisinopril-hydrochlorothiazide (PRINZIDE,ZESTORETIC) 20-12.5 MG tablet Take 1 tablet by mouth 2 (two)  times daily. 180 tablet 3   No current facility-administered medications for this visit.    Facility-Administered Medications Ordered in Other Visits  Medication Dose Route Frequency Provider Last Rate Last Dose  . sodium chloride flush (NS) 0.9 % injection 10 mL  10 mL Intracatheter PRN [name], MD   10 mL at 05/12/18 9179    PHYSICAL EXAMINATION: ECOG PERFORMANCE STATUS: 1 - Symptomatic but completely ambulatory  Vitals:   05/12/18 1022  BP: (!) 163/112  Pulse: (!) 57  Resp: 18  Temp: 97.7 F (36.5 C)  SpO2: 100%   Filed Weights   05/12/18 1022  Weight: 181 lb 3.2 oz (82.2 kg)    GENERAL:alert, no distress and comfortable SKIN: Macular rash on the neck EYES: normal, Conjunctiva are pink and non-injected, sclera clear OROPHARYNX:no exudate, no erythema and lips, buccal mucosa, and tongue normal  NECK: supple, thyroid normal size, non-tender, without nodularity LYMPH:  no palpable lymphadenopathy in the cervical, axillary or inguinal LUNGS: clear to auscultation and percussion with normal breathing effort HEART: regular rate & rhythm and no murmurs and no  lower extremity edema ABDOMEN:abdomen soft, non-tender and normal bowel sounds MUSCULOSKELETAL:no cyanosis of digits and no clubbing  NEURO: alert & oriented x 3 with fluent speech, no focal motor/sensory deficits EXTREMITIES: No lower extremity edema     LABORATORY DATA:  I have reviewed the data as listed CMP Latest Ref Rng & Units 05/12/2018 04/24/2018 04/03/2018  Glucose 70 - 99 mg/dL 82 94 97  BUN 6 - 20 mg/dL _0 Creatinine 0.44 - 1.00 mg/dL 0.76 0.74 0.70  Sodium 135 - 145 mmol/L 138 142 141  Potassium 3.5 - 5.1 mmol/L 3.8 3.9 3.3(L)  Chloride 98 - 111 mmol/L 105 107 107  CO2 22 - 32 mmol/L _1 Calcium 8.9 - 10.3 mg/dL 8.9 9.1 9.2  Total Protein 6.5 - 8.1 g/dL 7.4 7.2 7.6  Total Bilirubin 0.3 - 1.2 mg/dL 0.5 0.4 0.5  Alkaline Phos 38 - 126 U/L 93 97 98  AST 15 - 41 U/L 23 15 13(L)  ALT  0 - 44 U/L _2 Lab Results  Component Value Date   WBC 3.9 (L) 05/12/2018   HGB 12.9 05/12/2018   HCT 39.9 05/12/2018   MCV 86.6 05/12/2018   PLT 182 05/12/2018   NEUTROABS 1.9 05/12/2018    ASSESSMENT & PLAN:  Breast cancer of lower-outer quadrant of right female breast ([place]) Right breast biopsy 12/03/2014 8:00: Invasive ductal carcinoma, grade 3, ER 0%, PR 0%, 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 Neoadj chemo 12/24/14- 04/29/15 AC x 4 foll by Abraxane X 12 Rt Lumpectomy: Path CR 0/2 LN Adj XRT 07/23/15- 09/08/15   PET/CT scan 11/17/2016:Subcutaneous nodules in the neck, upper back, left arm, abdomen and pelvis  Patient progressed on Xeloda January 2019-07/04/2017 stopped due to progression of disease  Cerebellar mass diagnosed 07/12/2016: Resection followed by stereotactic radiation 07/29/2016  MRI brain 12/17/2017: No evidence of recurrent or residual metastatic disease to the brain. Post radiation changes in the right cerebellum  CT abdomen and pelvis 12/12/2017: Mixed response to therapy, interval decrease in the mid abdominal nodule(2.4 cm to 1.4 cm)and interval new anterior abdominal nodule (1.7 cm). Increase in right inguinal lymph node (1.1 cm to 1.3 cm)and resolution of the left supraclavicular lymph node  CT CAP:02/02/2018: Met LN Left Supraclavicular nodal station stable, Rt Inguinal LN increased with Rt inguinal lymphadenopathy, lymphadenopathy treated with radiation  MRI brain 03/24/2018 is stable, no new or recurrent metastases.  Lymph node from 03/02/2018 biopsy: PDL1+ _____________________________________________________________________________  Current treatment:Pembrolizumab given every 3 weeks starting 04/03/18, today's cycle 3  Keytruda toxicities:   Currently on potassium replacement for hypokalemia Macular rash on  the neck: I reviewed the NCCN guidelines and it requires Benadryl orally and betamethasone ointment topically. We will continue to monitor this.  We will need to monitor closely for immune mediated adverse effects Hyperthyroidism: TSH less than 0.08 this has also been noticed with immunotherapy.  It usually leads to hypothyroidism at a later point.  I would not treat the hyperthyroidism at this time.  We will continue to monitor the TSH levels.  Return to clinic every 3 weeks for Keytruda.  We will scan her after 4 cycles of Keytruda.    No orders of the defined types were placed in this encounter.  The patient has a good understanding of the overall plan.  she agrees with it. she will call with any problems that may develop before the next visit here.   Viinay K Gudena, MD 05/12/18    

## 2018-05-12 NOTE — Progress Notes (Signed)
Labs reviewed by Dr. Lindi Adie.  Verbal orders given for starting Synthroid 68mcg 1 tablet daily.  Recheck TSH at next lab appointment in Jan.  Patient aware, as well as infusion nurse.

## 2018-05-12 NOTE — Assessment & Plan Note (Signed)
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 Neoadj chemo 12/24/14- 04/29/15 AC x 4 foll by Abraxane X 12 Rt Lumpectomy: Path CR 0/2 LN Adj XRT 07/23/15- 09/08/15   PET/CT scan 11/17/2016:Subcutaneous nodules in the neck, upper back, left arm, abdomen and pelvis  Patient progressed on Xeloda January 2019-07/04/2017 stopped due to progression of disease  Cerebellar mass diagnosed 07/12/2016: Resection followed by stereotactic radiation 07/29/2016  MRI brain 12/17/2017: No evidence of recurrent or residual metastatic disease to the brain. Post radiation changes in the right cerebellum  CT abdomen and pelvis 12/12/2017: Mixed response to therapy, interval decrease in the mid abdominal nodule(2.4 cm to 1.4 cm)and interval new anterior abdominal nodule (1.7 cm). Increase in right inguinal lymph node (1.1 cm to 1.3 cm)and resolution of the left supraclavicular lymph node  CT CAP:02/02/2018: Met LN Left Supraclavicular nodal station stable, Rt Inguinal LN increased with Rt inguinal lymphadenopathy, lymphadenopathy treated with radiation  MRI brain 03/24/2018 is stable, no new or recurrent metastases.  Lymph node from 03/02/2018 biopsy: PDL1+ _____________________________________________________________________________  Current treatment:Pembrolizumab given every 3 weeks starting 04/03/18, today's cycle 3  Keytruda toxicities: Other than fatigue she is doing extremely well Currently on potassium replacement for hypokalemia  We will need to monitor closely for immune mediated adverse effects Hyperthyroidism: TSH less than 0.08 this has also been noticed with immunotherapy.  It usually leads to hypothyroidism at a later point.  I would not treat the hyperthyroidism at this time.  We will  continue to monitor the TSH levels.  Return to clinic every 3 weeks for Keytruda.  We will scan her before the next treatment.

## 2018-05-12 NOTE — Patient Instructions (Signed)
[facility] Discharge Instructions for Patients Receiving Chemotherapy  Today you received the following chemotherapy agents Keytruda  To help prevent nausea and vomiting after your treatment, we encourage you to take your nausea medication .   If you develop nausea and vomiting that is not controlled by your nausea medication, call the clinic.   BELOW ARE SYMPTOMS THAT SHOULD BE REPORTED IMMEDIATELY:  *FEVER GREATER THAN 100.5 F  *CHILLS WITH OR WITHOUT FEVER  NAUSEA AND VOMITING THAT IS NOT CONTROLLED WITH YOUR NAUSEA MEDICATION  *UNUSUAL SHORTNESS OF BREATH  *UNUSUAL BRUISING OR BLEEDING  TENDERNESS IN MOUTH AND THROAT WITH OR WITHOUT PRESENCE OF ULCERS  *URINARY PROBLEMS  *BOWEL PROBLEMS  UNUSUAL RASH Items with * indicate a potential emergency and should be followed up as soon as possible.  Feel free to call the clinic should you have any questions or concerns. The clinic phone number is [phone].  Please show the CHEMO ALERT CARD at check-in to the Emergency Department and triage nurse.   

## 2018-05-15 ENCOUNTER — Telehealth: Payer: Self-pay

## 2018-05-15 ENCOUNTER — Other Ambulatory Visit: Payer: Self-pay

## 2018-05-15 MED ORDER — LEVOTHYROXINE SODIUM 75 MCG PO TABS: 75.0000 ug | ORAL_TABLET | Freq: Every day | ORAL | 1 refills | Status: DC

## 2018-05-15 NOTE — Telephone Encounter (Signed)
Pt states that she did not receive her synthroid prescription and had called the pharmacy twice to see if it was sent out yet. Told pt that it was escribed last Friday, but will resubmit today. Told pt to call her pharmacy in the next 30 minutes to make sure that they've received it. Pt verbalized understanding.  Pt interested in becoming a cancer coach/mentor. Will refer pt to alight program coordinator for more information. Obtained pt email for information.

## 2018-05-16 ENCOUNTER — Other Ambulatory Visit: Payer: Self-pay | Admitting: Radiation Therapy

## 2018-05-16 DIAGNOSIS — C7931 Secondary malignant neoplasm of brain: Secondary | ICD-10-CM

## 2018-05-16 DIAGNOSIS — C7949 Secondary malignant neoplasm of other parts of nervous system: Principal | ICD-10-CM

## 2018-05-18 ENCOUNTER — Encounter: Payer: Self-pay | Admitting: Radiation Therapy

## 2018-05-18 NOTE — Progress Notes (Signed)
A note was mailed out on 12/26 to let Adamaris know about the upcoming brain MRI and Rad Onc follow-up scheduled in Feb.   Mont Dutton R.T.(R)(T) Special Procedures Navigator

## 2018-06-01 ENCOUNTER — Inpatient Hospital Stay: Payer: BLUE CROSS/BLUE SHIELD | Attending: Hematology and Oncology

## 2018-06-01 ENCOUNTER — Inpatient Hospital Stay: Payer: BLUE CROSS/BLUE SHIELD

## 2018-06-01 ENCOUNTER — Encounter: Payer: Self-pay | Admitting: Adult Health

## 2018-06-01 ENCOUNTER — Other Ambulatory Visit: Payer: Self-pay | Admitting: Adult Health

## 2018-06-01 ENCOUNTER — Inpatient Hospital Stay (HOSPITAL_BASED_OUTPATIENT_CLINIC_OR_DEPARTMENT_OTHER): Payer: BLUE CROSS/BLUE SHIELD | Admitting: Adult Health

## 2018-06-01 ENCOUNTER — Other Ambulatory Visit: Payer: Self-pay | Admitting: Hematology and Oncology

## 2018-06-01 ENCOUNTER — Telehealth: Payer: Self-pay | Admitting: Adult Health

## 2018-06-01 VITALS — BP 131/96 | HR 62 | Temp 97.9°F | Resp 20 | Ht 63.0 in | Wt 179.2 lb

## 2018-06-01 DIAGNOSIS — E039 Hypothyroidism, unspecified: Secondary | ICD-10-CM

## 2018-06-01 DIAGNOSIS — C792 Secondary malignant neoplasm of skin: Secondary | ICD-10-CM

## 2018-06-01 DIAGNOSIS — Z171 Estrogen receptor negative status [ER-]: Secondary | ICD-10-CM | POA: Insufficient documentation

## 2018-06-01 DIAGNOSIS — C50511 Malignant neoplasm of lower-outer quadrant of right female breast: Secondary | ICD-10-CM

## 2018-06-01 DIAGNOSIS — C7931 Secondary malignant neoplasm of brain: Secondary | ICD-10-CM | POA: Diagnosis not present

## 2018-06-01 DIAGNOSIS — Z5112 Encounter for antineoplastic immunotherapy: Secondary | ICD-10-CM | POA: Diagnosis present

## 2018-06-01 DIAGNOSIS — E079 Disorder of thyroid, unspecified: Secondary | ICD-10-CM

## 2018-06-01 LAB — CMP (CANCER CENTER ONLY)
ALT: 12 U/L (ref 0–44)
AST: 18 U/L (ref 15–41)
Albumin: 3.9 g/dL (ref 3.5–5.0)
Alkaline Phosphatase: 86 U/L (ref 38–126)
Anion gap: 9 (ref 5–15)
BUN: 11 mg/dL (ref 6–20)
CO2: 25 mmol/L (ref 22–32)
Calcium: 8.9 mg/dL (ref 8.9–10.3)
Chloride: 106 mmol/L (ref 98–111)
Creatinine: 0.84 mg/dL (ref 0.44–1.00)
GFR, Estimated: >60 mL/min (ref >60)
Glucose, Bld: 87 mg/dL (ref 70–99)
Potassium: 3.6 mmol/L (ref 3.5–5.1)
Sodium: 140 mmol/L (ref 135–145)
TOTAL PROTEIN: 7.8 g/dL (ref 6.5–8.1)
Total Bilirubin: 0.5 mg/dL (ref 0.3–1.2)

## 2018-06-01 LAB — CBC WITH DIFFERENTIAL (CANCER CENTER ONLY)
Abs Immature Granulocytes: 0 10*3/uL (ref 0.00–0.07)
Basophils Absolute: 0 10*3/uL (ref 0.0–0.1)
Basophils Relative: 1 %
Eosinophils Absolute: 0.1 10*3/uL (ref 0.0–0.5)
Eosinophils Relative: 2 %
HCT: 40.5 % (ref 36.0–46.0)
Hemoglobin: 13 g/dL (ref 12.0–15.0)
Immature Granulocytes: 0 %
Lymphocytes Relative: 32 %
Lymphs Abs: 1.2 10*3/uL (ref 0.7–4.0)
MCH: 28.3 pg (ref 26.0–34.0)
MCHC: 32.1 g/dL (ref 30.0–36.0)
MCV: 88 fL (ref 80.0–100.0)
Monocytes Absolute: 0.3 10*3/uL (ref 0.1–1.0)
Monocytes Relative: 8 %
Neutro Abs: 2.1 10*3/uL (ref 1.7–7.7)
Neutrophils Relative %: 57 %
Platelet Count: 186 10*3/uL (ref 150–400)
RBC: 4.6 MIL/uL (ref 3.87–5.11)
RDW: 14.5 % (ref 11.5–15.5)
WBC Count: 3.6 10*3/uL — ABNORMAL LOW (ref 4.0–10.5)
nRBC: 0 % (ref 0.0–0.2)

## 2018-06-01 LAB — T4, FREE: Free T4: 0.87 ng/dL (ref 0.82–1.77)

## 2018-06-01 LAB — TSH: TSH: 76.638 u[IU]/mL — ABNORMAL HIGH (ref 0.308–3.960)

## 2018-06-01 MED ORDER — HEPARIN SOD (PORK) LOCK FLUSH 100 UNIT/ML IV SOLN
500.0000 [IU] | Freq: Once | INTRAVENOUS | Status: AC | PRN
  Administered 2018-06-01: 500 [IU]
  Filled 2018-06-01: qty 5

## 2018-06-01 MED ORDER — LEVOTHYROXINE SODIUM 150 MCG PO TABS: 150.0000 ug | ORAL_TABLET | Freq: Every day | ORAL | 1 refills | Status: DC

## 2018-06-01 MED ORDER — SODIUM CHLORIDE 0.9% FLUSH
10.0000 mL | INTRAVENOUS | Status: DC | PRN
  Administered 2018-06-01: 10 mL
  Filled 2018-06-01: qty 10

## 2018-06-01 MED ORDER — SODIUM CHLORIDE 0.9 % IV SOLN
Freq: Once | INTRAVENOUS | Status: AC
  Administered 2018-06-01: 12:00:00 via INTRAVENOUS
  Filled 2018-06-01: qty 250

## 2018-06-01 MED ORDER — SODIUM CHLORIDE 0.9 % IV SOLN
200.0000 mg | Freq: Once | INTRAVENOUS | Status: AC
  Administered 2018-06-01: 200 mg via INTRAVENOUS
  Filled 2018-06-01: qty 8

## 2018-06-01 NOTE — Assessment & Plan Note (Addendum)
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 Neoadj chemo 12/24/14- 04/29/15 AC x 4 foll by Abraxane X 12 Rt Lumpectomy: Path CR 0/2 LN Adj XRT 07/23/15- 09/08/15   PET/CT scan 11/17/2016:Subcutaneous nodules in the neck, upper back, left arm, abdomen and pelvis  Patient progressed on Xeloda January 2019-07/04/2017 stopped due to progression of disease  Cerebellar mass diagnosed 07/12/2016: Resection followed by stereotactic radiation 07/29/2016  MRI brain 12/17/2017: No evidence of recurrent or residual metastatic disease to the brain. Post radiation changes in the right cerebellum  CT abdomen and pelvis 12/12/2017: Mixed response to therapy, interval decrease in the mid abdominal nodule(2.4 cm to 1.4 cm)and interval new anterior abdominal nodule (1.7 cm). Increase in right inguinal lymph node (1.1 cm to 1.3 cm)and resolution of the left supraclavicular lymph node  CT CAP:02/02/2018: Met LN Left Supraclavicular nodal station stable, Rt Inguinal LN increased with Rt inguinal lymphadenopathy, lymphadenopathy treated with radiation  MRI brain 03/24/2018 is stable, no new or recurrent metastases.  Lymph node from 03/02/2018 biopsy: PDL1+ _____________________________________________________________________________  Current treatment:Pembrolizumab given every 3 weeks starting 04/03/18, today's cycle 4  Keytruda toxicities: Other than fatigue she is doing extremely well  Hypothyroidism: on synthroid replacement, symptomatically improved, continue synthroid Hypokalemia: resolved  I ordered repeat scans of CT chest abdomen and pelvis.  Reviewed above plan with Dr. Gudena who is in agreement.  He will see her back on 06/22/2017 to review scans.    Return to clinic every 3 weeks for  Keytruda.   

## 2018-06-01 NOTE — Progress Notes (Signed)
Because of elevated TSH, increase the dosage of levothyroxine to 150 mg daily and I sent a new prescription to her pharmacy.  I informed the patient of this change

## 2018-06-01 NOTE — Patient Instructions (Signed)
[facility] Discharge Instructions for Patients Receiving Chemotherapy  Today you received the following chemotherapy agents Keytruda  To help prevent nausea and vomiting after your treatment, we encourage you to take your nausea medication .   If you develop nausea and vomiting that is not controlled by your nausea medication, call the clinic.   BELOW ARE SYMPTOMS THAT SHOULD BE REPORTED IMMEDIATELY:  *FEVER GREATER THAN 100.5 F  *CHILLS WITH OR WITHOUT FEVER  NAUSEA AND VOMITING THAT IS NOT CONTROLLED WITH YOUR NAUSEA MEDICATION  *UNUSUAL SHORTNESS OF BREATH  *UNUSUAL BRUISING OR BLEEDING  TENDERNESS IN MOUTH AND THROAT WITH OR WITHOUT PRESENCE OF ULCERS  *URINARY PROBLEMS  *BOWEL PROBLEMS  UNUSUAL RASH Items with * indicate a potential emergency and should be followed up as soon as possible.  Feel free to call the clinic should you have any questions or concerns. The clinic phone number is [phone].  Please show the CHEMO ALERT CARD at check-in to the Emergency Department and triage nurse.   

## 2018-06-01 NOTE — Telephone Encounter (Signed)
Per 1/9 no los °

## 2018-06-01 NOTE — Progress Notes (Signed)
[facility] Follow up:    Kaitlyn Lose, MD [place] [id number]   DIAGNOSIS: Cancer Staging Breast cancer of lower-outer quadrant of right female breast [facility]) Staging form: Breast, AJCC 7th Edition - Clinical stage from 12/11/2014: Stage IIA (T2, N0, M0) - Unsigned Staging comments: Staged at breast conference on 7.20.16 - Pathologic stage from 06/16/2015: yT0, N0, cM0 - Unsigned   SUMMARY OF ONCOLOGIC HISTORY:   Breast cancer of lower-outer quadrant of right female breast ([place])   12/03/2014 Mammogram    Right breast mass 1.9 cm it o'clock position 8 cm depth from the nipple    12/03/2014 Initial Diagnosis    Right breast biopsy 8:00: Invasive ductal carcinoma, grade 3, ER 0%, PR 0%, Ki-67 90%, HER-2 negative ratio 1.43    12/10/2014 Breast MRI    Right breast lower outer quadrant: 2.3 x 2.4 x 2.4 cm rim-enhancing mass abuts the pectoralis fascia but no enhancement of pectoralis muscle, second focus of artifact?'s second tissue marker clip, no lymph nodes    12/10/2014 Clinical Stage    Stage IIA: T2 N0    12/24/2014 - 04/29/2015 Neo-Adjuvant Chemotherapy    Dose dense Adriamycin and Cytoxan 4 followed by weekly Abraxane 12    05/02/2015 Breast MRI    complete radiologic response    06/16/2015 Surgery    Left Lumpectomy: Complete path Response, 0/2 LN    06/16/2015 Pathologic Stage    ypT0 ypN0    07/23/2015 - 09/05/2015 Radiation Therapy    Adjuvant RT: 50.4 Gy in 28 fractions and a boost of 10 Gy in 5 fractions to total dose of 60.4 Gy    10/24/2015 Survivorship    SCP mailed to patient in lieu of in person visit.    07/26/2016 - 07/27/2016 Radiation Therapy     SRS brain    07/27/2016 - 07/29/2016 Hospital Admission    Cerebellar mass: Right suboccipital craniotomy for tumor resection with stereotactic navigation: Metastatic poorly differentiated adenocarcinoma with extensive necrosis positive for CK 7, MOC 31, CK 5/6; Neg for  Er/PR, GATA-3, GCDFP CDX2, Napsin A and TTF-1    11/17/2016 PET scan    Subcutaneous nodules in the neck, upper back, left arm, abdomen and pelvis,. 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 identified in BARD 1, BRCA2 and NF1    12/31/2016 Miscellaneous    Everolimus 10 mg daily for cycle 1 if she cannot tolerate will decrease to 7.5 mg daily    05/24/2017 - 07/04/2017 Chemotherapy    Xeloda 2000 mg 2 weeks on 1 week off stopped due to progression of disease based on CT scans done 06/27/2017    07/25/2017 - 03/13/2018 Chemotherapy    [place] days 1 and 8 every 3 weeks stopped for progression     03/20/2018 - 03/31/2018 Radiation Therapy    Radiation to lymph nodes    04/03/2018 -  Chemotherapy    Keytruda every 3 weeks      CURRENT THERAPY:  Keytruda  INTERVAL HISTORY: Kaitlyn Keith 53 y.o. female returns for evaluation of her metastatic breast cancer prior to receiving her fourth cycle of Keytruda.  She is tolerating it well.  She did develop hypothyroidism after her  third cycle noted with TSH in the 70s.  She was treated with Synthroid and symptomatically is much improved today.  She isn't as fatigued.  She does note a right chest wall thickness near her right axilla.  Otherwise she is doing well.     Patient Active Problem List   Diagnosis Date Noted  . 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 Goals of care, counseling/discussion 07/04/2017  . Genetic testing 12/10/2016  . 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 Cerebellar mass 07/27/2016  . Cerebellar tumor ([place]) 07/27/2016  . 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 C2  cervical fracture ([place]) 07/12/2016  . Hypertension 07/12/2016  . Degenerative spinal arthritis 11/18/2015  . Chemotherapy-induced peripheral neuropathy ([place]) 06/23/2015  . Paronychia of great toe, left 02/04/2015  . Breast cancer of lower-outer quadrant of right female breast ([place]) 12/05/2014    has No Known Allergies.  MEDICAL HISTORY: Past Medical History:  Diagnosis Date  . Anxiety   . Arthritis   . Back pain   . Brain cancer ([place])    brian met from triple negative breast ca  . Breast cancer ([place])   . Breast cancer of lower-outer quadrant of right female breast ([place]) 12/05/2014  . Depression   . FH: chemotherapy 12/2014-04/2015  . Genetic testing 12/10/2016   [name] underwent genetic counseling and testing for hereditary cancer syndromes on 11/18/2016. 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 Mutations in MSH6 are associated with a hereditary cancer syndrome called Lynch syndrome. For more detailed discussion, please see genetic counseling documentation from 12/10/2016.  Testing was perfo  . Headache    due to brain cancer, no longer having them  . Hot flashes   . Hypertension   . 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 underwent genetic counseling and testing for hereditary cancer syndromes on 11/18/2016. 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 Mutations in MSH6 are associated with a hereditary cancer syndrome called Lynch syndrome. For more detailed discussion, please see genetic counseling documentation from 12/10/2016.  Testing was perfo  . 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 Radiation 07/23/15-09/05/15   right breast 50.4 Gy, boost of 10 Gy    SURGICAL HISTORY: Past Surgical History:  Procedure Laterality Date  . APPLICATION OF CRANIAL NAVIGATION Right 07/27/2016   Procedure: APPLICATION OF CRANIAL  NAVIGATION;  Surgeon: Kevan Ny Ditty, MD;  Location: [place];  Service: Neurosurgery;  Laterality: Right;  . BIOPSY OF SKIN SUBCUTANEOUS TISSUE AND/OR MUCOUS MEMBRANE Left 12/24/2016   Procedure: OPEN BIOPSY LESIONS ON LEFT LOWER BACK AND SHOULDER BLADE;  Surgeon: [name], MD;  Location: [place];  Service: General;  Laterality: Left;  . BREAST LUMPECTOMY WITH NEEDLE LOCALIZATION AND AXILLARY SENTINEL LYMPH NODE BX Right 06/16/2015   Procedure: BREAST LUMPECTOMY WITH NEEDLE LOCALIZATION AND AXILLARY SENTINEL LYMPH NODE BX;  Surgeon: [name], MD;  Location: [place];  Service: General;  Laterality: Right;  . BREAST REDUCTION SURGERY    . CRANIOTOMY Right 07/27/2016   Procedure: Right Suboccipital craniotomy for tumor resection with stereotactic navigation;  Surgeon: Kevan Ny Ditty, MD;  Location: [place];  Service: Neurosurgery;  Laterality: Right;  . PORT-A-CATH REMOVAL N/A 06/16/2015   Procedure: REMOVAL PORT-A-CATH;  Surgeon: [name], MD;  Location: [place];  Service: General;  Laterality: N/A;  .  PORTACATH PLACEMENT N/A 12/23/2014   Procedure: INSERTION PORT-A-CATH;  Surgeon: [name], MD;  Location: [place];  Service: General;  Laterality: N/A;  . PORTACATH PLACEMENT Left 07/08/2017   Procedure: INSERTION PORT-A-CATH;  Surgeon: [name], MD;  Location: [place];  Service: General;  Laterality: Left;    SOCIAL HISTORY: Social History   Socioeconomic History  . Marital status: Divorced    Spouse name: Not on file  . Number of children: 1  . Years of education: Not on file  . Highest education level: Not on file  Occupational History  . Not on file  Social Needs  . Financial resource strain: Not on file  . Food insecurity:    Worry: Not on file    Inability: Not on file  . Transportation needs:    Medical: No    Non-medical: No  Tobacco Use  . Smoking status: Never Smoker  . Smokeless  tobacco: Never Used  Substance and Sexual Activity  . Alcohol use: Yes    Comment: social  . Drug use: No  . Sexual activity: Yes  Lifestyle  . Physical activity:    Days per week: Not on file    Minutes per session: Not on file  . Stress: Not on file  Relationships  . Social connections:    Talks on phone: Not on file    Gets together: Not on file    Attends religious service: Not on file    Active member of club or organization: Not on file    Attends meetings of clubs or organizations: Not on file    Relationship status: Not on file  . Intimate partner violence:    Fear of current or ex partner: No    Emotionally abused: No    Physically abused: No    Forced sexual activity: No  Other Topics Concern  . Not on file  Social History Narrative  . Not on file    FAMILY HISTORY: Family History  Problem Relation Age of Onset  . Aneurysm Mother 62       d.55  . Heart attack Father 52       d.62  . Endometrial cancer Sister 11  . Lung cancer Maternal Uncle        d.68s  . Cancer Maternal Grandmother        unspecified type-possibly stomach d.88s    Review of Systems  Constitutional: Positive for fatigue (Improving). Negative for appetite change, chills and unexpected weight change.  HENT:   Negative for hearing loss, lump/mass and trouble swallowing (globus sensation in throat).   Eyes: Negative for eye problems and icterus.  Respiratory: Negative for chest tightness, cough and shortness of breath.   Cardiovascular: Negative for chest pain, leg swelling and palpitations.  Gastrointestinal: Negative for abdominal distention, abdominal pain, constipation, diarrhea, nausea and vomiting.  Endocrine: Negative for hot flashes.  Musculoskeletal: Negative for arthralgias.  Skin: Negative for itching and rash.  Neurological: Negative for dizziness, extremity weakness and numbness.  Hematological: Negative for adenopathy. Does not bruise/bleed easily.  Psychiatric/Behavioral:  Negative for depression. The patient is not nervous/anxious.       PHYSICAL EXAMINATION  ECOG PERFORMANCE STATUS: 1 - Symptomatic but completely ambulatory  Vitals:   06/01/18 0956  BP: (!) 131/96  Pulse: 62  Resp: 20  Temp: 97.9 F (36.6 C)  SpO2: 100%    Physical Exam Constitutional:      Appearance: Normal appearance.  HENT:     Head: Normocephalic.     Mouth/Throat:     Mouth: Mucous membranes are moist.     Pharynx: Oropharynx is clear.  Eyes:     General: No scleral icterus.    Pupils: Pupils are equal, round, and reactive to light.  Neck:     Comments: Negative for thyromegaly  Cardiovascular:     Rate and Rhythm: Normal rate and regular rhythm.     Heart sounds: Normal heart sounds.  Pulmonary:     Effort: Pulmonary effort is normal.     Breath sounds: Normal breath sounds.     Comments: Right chest wall with 1 cm nodule noted subcutaneously on right axillary line Abdominal:     General: Bowel sounds are normal. There is no distension.     Palpations: Abdomen is soft.     Tenderness: There is no abdominal tenderness.     Hernia: No hernia is present.  Musculoskeletal:        General: No swelling.  Lymphadenopathy:     Cervical: No cervical adenopathy.  Skin:    General: Skin is warm and dry.     Capillary Refill: Capillary refill takes less than 2 seconds.     Findings: No rash.  Neurological:     General: No focal deficit present.     Mental Status: She is alert.  Psychiatric:        Mood and Affect: Mood normal.        Behavior: Behavior normal.     LABORATORY DATA:  CBC    Component Value Date/Time   WBC 3.6 (L) 06/01/2018 0935   WBC 3.0 (L) 03/02/2018 1202   RBC 4.60 06/01/2018 0935   HGB 13.0 06/01/2018 0935   HGB 11.7 04/04/2017 1416   HCT 40.5 06/01/2018 0935   HCT 35.9 04/04/2017 1416   PLT 186 06/01/2018 0935   PLT 207 04/04/2017 1416   MCV 88.0 06/01/2018 0935   MCV 78.4 (L) 04/04/2017 1416   MCH 28.3 06/01/2018 0935    MCHC 32.1 06/01/2018 0935   RDW 14.5 06/01/2018 0935   RDW 12.8 04/04/2017 1416   LYMPHSABS 1.2 06/01/2018 0935   LYMPHSABS 1.2 04/04/2017 1416   MONOABS 0.3 06/01/2018 0935   MONOABS 0.4 04/04/2017 1416   EOSABS 0.1 06/01/2018 0935   EOSABS 0.0 04/04/2017 1416   BASOSABS 0.0 06/01/2018 0935   BASOSABS 0.0 04/04/2017 1416    CMP     Component Value Date/Time   NA 138 05/12/2018 0934   NA 139 04/04/2017 1416   K 3.8 05/12/2018 0934   K 3.7 04/04/2017 1416   CL 105 05/12/2018 0934   CO2 26 05/12/2018 0934   CO2 24 04/04/2017 1416   GLUCOSE 82 05/12/2018 0934   GLUCOSE 76 04/04/2017 1416   BUN 10 05/12/2018 0934   BUN 5.8 (L) 04/04/2017 1416   CREATININE 0.76 05/12/2018 0934   CREATININE 0.8 04/04/2017 1416   CALCIUM 8.9 05/12/2018 0934   CALCIUM 8.6 04/04/2017 1416   PROT 7.4 05/12/2018 0934   PROT 7.0 04/04/2017 1416   ALBUMIN 3.7 05/12/2018 0934   ALBUMIN 3.0 (L) 04/04/2017 1416   AST 23 05/12/2018 0934   AST 18 04/04/2017 1416   ALT 18 05/12/2018 0934   ALT 12 04/04/2017 1416   ALKPHOS 93 05/12/2018 0934   ALKPHOS 131 04/04/2017 1416   BILITOT 0.5 05/12/2018 0934   BILITOT 0.43 04/04/2017 1416   GFRNONAA >60 05/12/2018 0934   GFRAA >  60 05/12/2018 0934         ASSESSMENT and THERAPY PLAN:   Breast cancer of lower-outer quadrant of right female breast ([place]) Right breast biopsy 12/03/2014 8:00: Invasive ductal carcinoma, grade 3, ER 0%, PR 0%, 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 Neoadj chemo 12/24/14- 04/29/15 AC x 4 foll by Abraxane X 12 Rt Lumpectomy: Path CR 0/2 LN Adj XRT 07/23/15- 09/08/15   PET/CT scan 11/17/2016:Subcutaneous nodules in the neck, upper back, left arm, abdomen and pelvis  Patient progressed on Xeloda January 2019-07/04/2017 stopped due to progression of disease  Cerebellar mass diagnosed 07/12/2016: Resection  followed by stereotactic radiation 07/29/2016  MRI brain 12/17/2017: No evidence of recurrent or residual metastatic disease to the brain. Post radiation changes in the right cerebellum  CT abdomen and pelvis 12/12/2017: Mixed response to therapy, interval decrease in the mid abdominal nodule(2.4 cm to 1.4 cm)and interval new anterior abdominal nodule (1.7 cm). Increase in right inguinal lymph node (1.1 cm to 1.3 cm)and resolution of the left supraclavicular lymph node  CT CAP:02/02/2018: Met LN Left Supraclavicular nodal station stable, Rt Inguinal LN increased with Rt inguinal lymphadenopathy, lymphadenopathy treated with radiation  MRI brain 03/24/2018 is stable, no new or recurrent metastases.  Lymph node from 03/02/2018 biopsy: PDL1+ _____________________________________________________________________________  Current treatment:Pembrolizumab given every 3 weeks starting 04/03/18, today's cycle 4  Keytruda toxicities: Other than fatigue she is doing extremely well  Hypothyroidism: on synthroid replacement, symptomatically improved, continue synthroid Hypokalemia: resolved  I ordered repeat scans of CT chest abdomen and pelvis.  Reviewed above plan with Dr. Lindi Adie who is in agreement.  He will see her back on 06/22/2017 to review scans.    Return to clinic every 3 weeks for Keytruda.     Orders Placed This Encounter  Procedures  . CT Chest W Contrast    Standing Status:   Future    Standing Expiration Date:   06/01/2019    Order Specific Question:   If indicated for the ordered procedure, I authorize the administration of contrast media per Radiology protocol    Answer:   Yes    Order Specific Question:   Is patient pregnant?    Answer:   No    Order Specific Question:   Preferred imaging location?    Answer:   [facility]    Order Specific Question:   Radiology Contrast Protocol - do NOT remove file path    Answer:    \\[email or web address]  . CT Abdomen Pelvis W Contrast    Standing Status:   Future    Standing Expiration Date:   06/01/2019    Order Specific Question:   If indicated for the ordered procedure, I authorize the administration of contrast media per Radiology protocol    Answer:   Yes    Order Specific Question:   Is patient pregnant?    Answer:   No    Order Specific Question:   Preferred imaging location?    Answer:   [facility]    Order Specific Question:   Is Oral Contrast requested for this exam?    Answer:   Yes, Per Radiology protocol    Order Specific Question:   Radiology Contrast Protocol - do NOT remove file path    Answer:   \\[email or web address]    All questions were answered. The  patient knows to call the clinic with any problems, questions or concerns. We can certainly see the patient much sooner if necessary.  A total of (20) minutes of face-to-face time was spent with this patient with greater than 50% of that time in counseling and care-coordination.  This note was electronically signed. Scot Dock, NP 06/01/2018

## 2018-06-06 ENCOUNTER — Other Ambulatory Visit: Payer: Self-pay | Admitting: Hematology and Oncology

## 2018-06-12 ENCOUNTER — Ambulatory Visit (HOSPITAL_COMMUNITY)
Admission: RE | Admit: 2018-06-12 | Discharge: 2018-06-12 | Disposition: A | Discharge status: Home / Self Care | Payer: BLUE CROSS/BLUE SHIELD | Source: Ambulatory Visit | Attending: Adult Health | Admitting: Adult Health

## 2018-06-12 ENCOUNTER — Encounter (HOSPITAL_COMMUNITY): Payer: Self-pay

## 2018-06-12 DIAGNOSIS — C792 Secondary malignant neoplasm of skin: Secondary | ICD-10-CM

## 2018-06-12 DIAGNOSIS — Z171 Estrogen receptor negative status [ER-]: Secondary | ICD-10-CM | POA: Diagnosis present

## 2018-06-12 DIAGNOSIS — C50511 Malignant neoplasm of lower-outer quadrant of right female breast: Secondary | ICD-10-CM | POA: Diagnosis not present

## 2018-06-12 MED ORDER — IOHEXOL 300 MG/ML  SOLN
100.0000 mL | Freq: Once | INTRAMUSCULAR | Status: AC | PRN
  Administered 2018-06-12: 100 mL via INTRAVENOUS

## 2018-06-12 MED ORDER — SODIUM CHLORIDE (PF) 0.9 % IJ SOLN
INTRAMUSCULAR | Status: AC
  Filled 2018-06-12: qty 50

## 2018-06-13 ENCOUNTER — Ambulatory Visit (HOSPITAL_COMMUNITY): Payer: BLUE CROSS/BLUE SHIELD

## 2018-06-16 ENCOUNTER — Telehealth: Payer: Self-pay | Admitting: *Deleted

## 2018-06-16 NOTE — Telephone Encounter (Signed)
Called pt and discussed CT scan results. Denies further questions or needs at this time.

## 2018-06-21 NOTE — Progress Notes (Signed)
Patient Care Team: [name], MD as PCP - General (Hematology and Oncology) [name], [name], [name], [name], [name], [name], NP (Inactive) as Nurse Practitioner (Nurse Practitioner)  DIAGNOSIS:    ICD-10-CM   1. 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, estrogen receptor negative ([place]) C50.511    Z17.1   4. 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%, HER-2 negative ratio 1.43    12/10/2014 Breast MRI    Right breast lower outer quadrant: 2.3 x 2.4 x 2.4 cm rim-enhancing mass abuts the pectoralis fascia but no enhancement of pectoralis muscle, second focus of artifact?'s second tissue marker clip, no lymph nodes    12/10/2014 Clinical Stage    Stage IIA: T2 N0    12/24/2014 - 04/29/2015 Neo-Adjuvant Chemotherapy    Dose dense Adriamycin and Cytoxan 4 followed by weekly Abraxane 12    05/02/2015 Breast MRI    complete radiologic response    06/16/2015 Surgery    Left Lumpectomy: Complete path Response, 0/2 LN    06/16/2015 Pathologic Stage    ypT0 ypN0    07/23/2015 - 09/05/2015 Radiation Therapy    Adjuvant RT: 50.4 Gy in 28 fractions and a boost of 10 Gy in 5  fractions to total dose of 60.4 Gy    10/24/2015 Survivorship    SCP mailed to patient in lieu of in person visit.    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 identified in BARD 1, BRCA2 and NF1    12/31/2016 Miscellaneous    Everolimus 10 mg daily for cycle 1 if she cannot tolerate will decrease to 7.5 mg daily    05/24/2017 - 07/04/2017 Chemotherapy    Xeloda 2000 mg 2 weeks on 1 week off stopped due to progression of disease based on CT scans done 06/27/2017    07/25/2017 - 03/13/2018 Chemotherapy    [place] days 1 and 8 every 3 weeks stopped for progression     03/20/2018 - 03/31/2018 Radiation Therapy  Radiation to lymph nodes    04/03/2018 -  Chemotherapy    Keytruda every 3 weeks      CHIEF COMPLIANT: Cycle 5 of Keytruda  INTERVAL HISTORY: Kaitlyn Keith is a 53 y.o. with above-mentioned history of metastatic breast cancer here for her fifth cycle of Keytruda. Her most recent CT CAP from 06/12/18 showed decrease in size of the left supraclavicular lymph node and nodule in the groin and no change in size of the lesion of the liver. She presents to the clinic alone today. She is doing well on levothyroxine but  reports fatigue and drowsiness. She has swelling and tightness in her left clavicle and intermittent back pain.   REVIEW OF SYSTEMS:   Constitutional: Denies fevers, chills or abnormal weight loss (+) drowsiness (+) fatigue  Eyes: Denies blurriness of vision Ears, nose, mouth, throat, and face: Denies mucositis or sore throat Respiratory: Denies cough, dyspnea or wheezes Cardiovascular: Denies palpitation, chest discomfort Gastrointestinal:  Denies nausea, heartburn or change in bowel habits Skin: Denies abnormal skin rashes MSK: (+) swelling in left clavicle (+) intermittent back pain Lymphatics: Denies new lymphadenopathy or easy bruising Neurological: Denies numbness, tingling or new weaknesses Behavioral/Psych: Mood is stable, no new changes  Extremities: No lower extremity edema Breast: denies any pain or lumps or nodules in either breasts All other systems were reviewed with the patient and are negative.  I have reviewed the past medical history, past surgical history, social history and family history with the patient and they are unchanged from previous note.  ALLERGIES:  has No Known Allergies.  MEDICATIONS:  Current Outpatient Medications  Medication Sig Dispense Refill  . ALPRAZolam (XANAX) 2 MG tablet Take 1 tablet (2 mg total) by mouth at bedtime. 30 tablet 5  . betamethasone valerate ointment (VALISONE) 0.1 % Apply 1 application topically 2 (two) times daily. 30 g 1  . cyclobenzaprine (FLEXERIL) 5 MG tablet Take 1 tablet (5 mg total) by mouth 3 (three) times daily as needed for muscle spasms. 30 tablet 0  . gabapentin (NEURONTIN) 100 MG capsule Take 1 capsule (100 mg total) by mouth at bedtime. 30 capsule 6  . levothyroxine (SYNTHROID, LEVOTHROID) 150 MCG tablet Take 1 tablet (150 mcg total) by mouth daily before breakfast. 30 tablet 6  . lidocaine-prilocaine (EMLA) cream APPLY TO AFFECTED AREA ONCE AS DIRECTED 30 g 3  . lisinopril-hydrochlorothiazide  (PRINZIDE,ZESTORETIC) 20-12.5 MG tablet Take 1 tablet by mouth 2 (two) times daily. 180 tablet 3   No current facility-administered medications for this visit.     PHYSICAL EXAMINATION: ECOG PERFORMANCE STATUS: 1 - Symptomatic but completely ambulatory  Vitals:   06/22/18 1315  BP: (!) 141/97  Pulse: 72  Resp: 16  Temp: 97.7 F (36.5 C)  SpO2: 100%   Filed Weights   06/22/18 1315  Weight: 83.1 kg    GENERAL: alert, no distress and comfortable SKIN: skin color, texture, turgor are normal, no rashes or significant lesions EYES: normal, Conjunctiva are pink and non-injected, sclera clear OROPHARYNX: no exudate, no erythema and lips, buccal mucosa, and tongue normal  NECK: supple, thyroid normal size, non-tender, without nodularity (+) swelling in left clavicle LYMPH: no palpable lymphadenopathy in the cervical, axillary or inguinal  LUNGS: clear to auscultation and percussion with normal breathing effort HEART: regular rate & rhythm and no murmurs and no lower extremity edema ABDOMEN: abdomen soft, non-tender and normal bowel sounds MUSCULOSKELETAL: no cyanosis of digits and no clubbing  NEURO: alert & oriented x 3  with fluent speech, no focal motor/sensory deficits EXTREMITIES: No lower extremity edema  LABORATORY DATA:  I have reviewed the data as listed CMP Latest Ref Rng & Units 06/01/2018 05/12/2018 04/24/2018  Glucose 70 - 99 mg/dL 87 82 94  BUN 6 - 20 mg/dL _0 Creatinine 0.44 - 1.00 mg/dL 0.84 0.76 0.74  Sodium 135 - 145 mmol/L 140 138 142  Potassium 3.5 - 5.1 mmol/L 3.6 3.8 3.9  Chloride 98 - 111 mmol/L 106 105 107  CO2 22 - 32 mmol/L _1 Calcium 8.9 - 10.3 mg/dL 8.9 8.9 9.1  Total Protein 6.5 - 8.1 g/dL 7.8 7.4 7.2  Total Bilirubin 0.3 - 1.2 mg/dL 0.5 0.5 0.4  Alkaline Phos 38 - 126 U/L 86 93 97  AST 15 - 41 U/L _2 ALT 0 - 44 U/L _3 Lab Results  Component Value Date   WBC 3.6 (L) 06/22/2018   HGB 12.7 06/22/2018   HCT 39.1  06/22/2018   MCV 87.5 06/22/2018   PLT 152 06/22/2018   NEUTROABS 2.2 06/22/2018    ASSESSMENT & PLAN:  Breast cancer of lower-outer quadrant of right female breast ([place]) Right breast biopsy 12/03/2014 8:00: Invasive ductal carcinoma, grade 3, ER 0%, PR 0%, 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 Neoadj chemo 12/24/14- 04/29/15 AC x 4 foll by Abraxane X 12 Rt Lumpectomy: Path CR 0/2 LN Adj XRT 07/23/15- 09/08/15 PET/CT scan 11/17/2016:Subcutaneous nodules in the neck, upper back, left arm, abdomen and pelvis Patient progressed on Xeloda January 2019-07/04/2017 stopped due to progression of disease Cerebellar mass diagnosed 07/12/2016: Resection followed by stereotactic radiation 07/29/2016 Lymph node from 03/02/2018 biopsy: PDL1+ ------------------------------------------------------------------------------------------------------------------------------------------------ Current treatment:Pembrolizumab given every 3 weeks starting 04/03/18, today's cycle 4 Keytruda toxicities:Hypothyroidism for which she required thyroid replacement therapy Awaiting today's TSH result. CT CAP 06/12/2018: Interval decrease in the left supraclavicular lymph node and the metastatic lesion in the subcutaneous fat of the right groin region also decreased.  No change in the 6 mm lesion in the right liver  Plan: Continue with the current therapy return to clinic every 3 weeks for pembrolizumab. Monitoring closely for toxicities.   No orders of the defined types were placed in this encounter.  The patient has a good understanding of the overall plan. she agrees with it. she will call with any problems that may develop before the next visit here.  [name], MD 06/22/2018  Julious Oka Dorshimer am acting as scribe for Dr. [name].  I have reviewed the above documentation for accuracy and  completeness, and I agree with the above.

## 2018-06-22 ENCOUNTER — Telehealth: Payer: Self-pay | Admitting: Hematology and Oncology

## 2018-06-22 ENCOUNTER — Other Ambulatory Visit: Payer: Self-pay | Admitting: Hematology and Oncology

## 2018-06-22 ENCOUNTER — Encounter: Payer: Self-pay | Admitting: *Deleted

## 2018-06-22 ENCOUNTER — Ambulatory Visit: Payer: BLUE CROSS/BLUE SHIELD

## 2018-06-22 ENCOUNTER — Inpatient Hospital Stay: Payer: BLUE CROSS/BLUE SHIELD

## 2018-06-22 ENCOUNTER — Inpatient Hospital Stay (HOSPITAL_BASED_OUTPATIENT_CLINIC_OR_DEPARTMENT_OTHER): Payer: BLUE CROSS/BLUE SHIELD | Admitting: Hematology and Oncology

## 2018-06-22 VITALS — BP 141/97 | HR 72 | Temp 97.7°F | Resp 16 | Ht 63.0 in | Wt 183.1 lb

## 2018-06-22 DIAGNOSIS — Z171 Estrogen receptor negative status [ER-]: Principal | ICD-10-CM

## 2018-06-22 DIAGNOSIS — C50511 Malignant neoplasm of lower-outer quadrant of right female breast: Secondary | ICD-10-CM | POA: Diagnosis not present

## 2018-06-22 DIAGNOSIS — E039 Hypothyroidism, unspecified: Secondary | ICD-10-CM | POA: Diagnosis not present

## 2018-06-22 DIAGNOSIS — Z5112 Encounter for antineoplastic immunotherapy: Secondary | ICD-10-CM | POA: Diagnosis not present

## 2018-06-22 DIAGNOSIS — C792 Secondary malignant neoplasm of skin: Secondary | ICD-10-CM

## 2018-06-22 DIAGNOSIS — C7931 Secondary malignant neoplasm of brain: Secondary | ICD-10-CM

## 2018-06-22 LAB — CBC WITH DIFFERENTIAL (CANCER CENTER ONLY)
Abs Immature Granulocytes: 0.01 10*3/uL (ref 0.00–0.07)
BASOS ABS: 0 10*3/uL (ref 0.0–0.1)
Basophils Relative: 1 %
Eosinophils Absolute: 0.1 10*3/uL (ref 0.0–0.5)
Eosinophils Relative: 2 %
HCT: 39.1 % (ref 36.0–46.0)
Hemoglobin: 12.7 g/dL (ref 12.0–15.0)
Immature Granulocytes: 0 %
LYMPHS ABS: 0.9 10*3/uL (ref 0.7–4.0)
LYMPHS PCT: 26 %
MCH: 28.4 pg (ref 26.0–34.0)
MCHC: 32.5 g/dL (ref 30.0–36.0)
MCV: 87.5 fL (ref 80.0–100.0)
Monocytes Absolute: 0.4 10*3/uL (ref 0.1–1.0)
Monocytes Relative: 10 %
NRBC: 0 % (ref 0.0–0.2)
Neutro Abs: 2.2 10*3/uL (ref 1.7–7.7)
Neutrophils Relative %: 61 %
Platelet Count: 152 10*3/uL (ref 150–400)
RBC: 4.47 MIL/uL (ref 3.87–5.11)
RDW: 13.9 % (ref 11.5–15.5)
WBC Count: 3.6 10*3/uL — ABNORMAL LOW (ref 4.0–10.5)

## 2018-06-22 LAB — CMP (CANCER CENTER ONLY)
ALT: 12 U/L (ref 0–44)
AST: 16 U/L (ref 15–41)
Albumin: 3.8 g/dL (ref 3.5–5.0)
Alkaline Phosphatase: 91 U/L (ref 38–126)
Anion gap: 6 (ref 5–15)
BUN: 8 mg/dL (ref 6–20)
CHLORIDE: 107 mmol/L (ref 98–111)
CO2: 27 mmol/L (ref 22–32)
Calcium: 9 mg/dL (ref 8.9–10.3)
Creatinine: 0.71 mg/dL (ref 0.44–1.00)
GFR, Est AFR Am: >60 mL/min (ref >60)
GFR, Estimated: >60 mL/min (ref >60)
Glucose, Bld: 107 mg/dL — ABNORMAL HIGH (ref 70–99)
Potassium: 3.4 mmol/L — ABNORMAL LOW (ref 3.5–5.1)
SODIUM: 140 mmol/L (ref 135–145)
Total Bilirubin: 0.7 mg/dL (ref 0.3–1.2)
Total Protein: 7.3 g/dL (ref 6.5–8.1)

## 2018-06-22 LAB — TSH: TSH: 0.201 u[IU]/mL — ABNORMAL LOW (ref 0.308–3.960)

## 2018-06-22 MED ORDER — HEPARIN SOD (PORK) LOCK FLUSH 100 UNIT/ML IV SOLN
500.0000 [IU] | Freq: Once | INTRAVENOUS | Status: AC | PRN
  Administered 2018-06-22: 500 [IU]
  Filled 2018-06-22: qty 5

## 2018-06-22 MED ORDER — LIDOCAINE-PRILOCAINE 2.5-2.5 % EX CREA: TOPICAL_CREAM | CUTANEOUS | 3 refills | Status: DC

## 2018-06-22 MED ORDER — ALPRAZOLAM 2 MG PO TABS: 2.0000 mg | ORAL_TABLET | Freq: Every day | ORAL | 5 refills | Status: DC

## 2018-06-22 MED ORDER — BETAMETHASONE VALERATE 0.1 % EX OINT: 1.0000 "application " | TOPICAL_OINTMENT | Freq: Two times a day (BID) | CUTANEOUS | 1 refills | Status: DC

## 2018-06-22 MED ORDER — LEVOTHYROXINE SODIUM 150 MCG PO TABS: 150.0000 ug | ORAL_TABLET | Freq: Every day | ORAL | 6 refills | Status: DC

## 2018-06-22 MED ORDER — SODIUM CHLORIDE 0.9 % IV SOLN
200.0000 mg | Freq: Once | INTRAVENOUS | Status: AC
  Administered 2018-06-22: 200 mg via INTRAVENOUS
  Filled 2018-06-22: qty 8

## 2018-06-22 MED ORDER — SODIUM CHLORIDE 0.9% FLUSH
10.0000 mL | INTRAVENOUS | Status: DC | PRN
  Administered 2018-06-22: 10 mL
  Filled 2018-06-22: qty 10

## 2018-06-22 MED ORDER — SODIUM CHLORIDE 0.9 % IV SOLN
Freq: Once | INTRAVENOUS | Status: AC
  Administered 2018-06-22: 15:00:00 via INTRAVENOUS
  Filled 2018-06-22: qty 250

## 2018-06-22 MED ORDER — LISINOPRIL-HYDROCHLOROTHIAZIDE 20-12.5 MG PO TABS: 1.0000 | ORAL_TABLET | Freq: Two times a day (BID) | ORAL | 3 refills | Status: DC

## 2018-06-22 MED ORDER — GABAPENTIN 100 MG PO CAPS: 100.0000 mg | ORAL_CAPSULE | Freq: Every day | ORAL | 6 refills | Status: DC

## 2018-06-22 MED ORDER — CYCLOBENZAPRINE HCL 5 MG PO TABS: 5.0000 mg | ORAL_TABLET | Freq: Three times a day (TID) | ORAL | 0 refills | Status: DC | PRN

## 2018-06-22 NOTE — Addendum Note (Signed)
Addended by: [name] on: 06/22/2018 02:13 PM   Modules accepted: Orders

## 2018-06-22 NOTE — Patient Instructions (Signed)
[facility] Discharge Instructions for Patients Receiving Chemotherapy  Today you received the following chemotherapy agents :  Keytruda.  To help prevent nausea and vomiting after your treatment, we encourage you to take your nausea medication as prescribed.   If you develop nausea and vomiting that is not controlled by your nausea medication, call the clinic.   BELOW ARE SYMPTOMS THAT SHOULD BE REPORTED IMMEDIATELY:  *FEVER GREATER THAN 100.5 F  *CHILLS WITH OR WITHOUT FEVER  NAUSEA AND VOMITING THAT IS NOT CONTROLLED WITH YOUR NAUSEA MEDICATION  *UNUSUAL SHORTNESS OF BREATH  *UNUSUAL BRUISING OR BLEEDING  TENDERNESS IN MOUTH AND THROAT WITH OR WITHOUT PRESENCE OF ULCERS  *URINARY PROBLEMS  *BOWEL PROBLEMS  UNUSUAL RASH Items with * indicate a potential emergency and should be followed up as soon as possible.  Feel free to call the clinic should you have any questions or concerns. The clinic phone number is [phone].  Please show the CHEMO ALERT CARD at check-in to the Emergency Department and triage nurse.  

## 2018-06-22 NOTE — Assessment & Plan Note (Signed)
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 Neoadj chemo 12/24/14- 04/29/15 AC x 4 foll by Abraxane X 12 Rt Lumpectomy: Path CR 0/2 LN Adj XRT 07/23/15- 09/08/15 PET/CT scan 11/17/2016:Subcutaneous nodules in the neck, upper back, left arm, abdomen and pelvis Patient progressed on Xeloda January 2019-07/04/2017 stopped due to progression of disease Cerebellar mass diagnosed 07/12/2016: Resection followed by stereotactic radiation 07/29/2016 Lymph node from 03/02/2018 biopsy: PDL1+ ------------------------------------------------------------------------------------------------------------------------------------------------ Current treatment:Pembrolizumab given every 3 weeks starting 04/03/18, today's cycle 4 Keytruda toxicities:Hypothyroidism for which she required thyroid replacement therapy  CT CAP 06/12/2018: Interval decrease in the left supraclavicular lymph node and the metastatic lesion in the subcutaneous fat of the right groin region also decreased.  No change in the 6 mm lesion in the right liver  Plan: Continue with the current therapy return to clinic every 3 weeks for pembrolizumab. Monitoring closely for toxicities.

## 2018-06-22 NOTE — Telephone Encounter (Signed)
Patient wanted aq later time for 3/12

## 2018-06-28 ENCOUNTER — Other Ambulatory Visit: Payer: Self-pay | Admitting: Radiation Therapy

## 2018-06-29 ENCOUNTER — Ambulatory Visit (HOSPITAL_COMMUNITY): Payer: BLUE CROSS/BLUE SHIELD

## 2018-06-30 ENCOUNTER — Ambulatory Visit (HOSPITAL_COMMUNITY)
Admission: RE | Admit: 2018-06-30 | Discharge: 2018-06-30 | Disposition: A | Discharge status: Home / Self Care | Payer: BLUE CROSS/BLUE SHIELD | Source: Ambulatory Visit | Attending: Radiation Oncology | Admitting: Radiation Oncology

## 2018-06-30 DIAGNOSIS — C7949 Secondary malignant neoplasm of other parts of nervous system: Secondary | ICD-10-CM | POA: Insufficient documentation

## 2018-06-30 DIAGNOSIS — C7931 Secondary malignant neoplasm of brain: Secondary | ICD-10-CM | POA: Diagnosis present

## 2018-06-30 MED ORDER — GADOBUTROL 1 MMOL/ML IV SOLN
9.0000 mL | Freq: Once | INTRAVENOUS | Status: AC | PRN
  Administered 2018-06-30: 9 mL via INTRAVENOUS

## 2018-07-03 ENCOUNTER — Inpatient Hospital Stay: Payer: BLUE CROSS/BLUE SHIELD | Attending: Hematology and Oncology

## 2018-07-03 DIAGNOSIS — Z171 Estrogen receptor negative status [ER-]: Secondary | ICD-10-CM | POA: Insufficient documentation

## 2018-07-03 DIAGNOSIS — C7931 Secondary malignant neoplasm of brain: Secondary | ICD-10-CM | POA: Insufficient documentation

## 2018-07-03 DIAGNOSIS — C50511 Malignant neoplasm of lower-outer quadrant of right female breast: Secondary | ICD-10-CM | POA: Insufficient documentation

## 2018-07-03 DIAGNOSIS — E039 Hypothyroidism, unspecified: Secondary | ICD-10-CM | POA: Insufficient documentation

## 2018-07-03 DIAGNOSIS — Z5112 Encounter for antineoplastic immunotherapy: Secondary | ICD-10-CM | POA: Insufficient documentation

## 2018-07-04 ENCOUNTER — Other Ambulatory Visit: Payer: Self-pay

## 2018-07-04 ENCOUNTER — Encounter: Payer: Self-pay | Admitting: Urology

## 2018-07-04 ENCOUNTER — Ambulatory Visit
Admission: RE | Admit: 2018-07-04 | Discharge: 2018-07-04 | Disposition: A | Discharge status: Home / Self Care | Payer: BLUE CROSS/BLUE SHIELD | Source: Ambulatory Visit | Attending: Urology | Admitting: Urology

## 2018-07-04 VITALS — BP 130/88 | HR 73 | Temp 98.1°F | Resp 20 | Ht 63.0 in | Wt 184.4 lb

## 2018-07-04 DIAGNOSIS — C786 Secondary malignant neoplasm of retroperitoneum and peritoneum: Secondary | ICD-10-CM | POA: Insufficient documentation

## 2018-07-04 DIAGNOSIS — C50511 Malignant neoplasm of lower-outer quadrant of right female breast: Secondary | ICD-10-CM | POA: Diagnosis present

## 2018-07-04 DIAGNOSIS — Z79899 Other long term (current) drug therapy: Secondary | ICD-10-CM | POA: Insufficient documentation

## 2018-07-04 DIAGNOSIS — Z171 Estrogen receptor negative status [ER-]: Secondary | ICD-10-CM | POA: Insufficient documentation

## 2018-07-04 DIAGNOSIS — C7931 Secondary malignant neoplasm of brain: Secondary | ICD-10-CM | POA: Insufficient documentation

## 2018-07-04 NOTE — Progress Notes (Signed)
Radiation Oncology         [phone] ________________________________  Name: Kaitlyn Keith MRN: [id number]  Date: 07/04/2018  DOB: 05-Apr-1966  Follow-Up Visit Note  CC: [name], MD  Ditty, Kevan Ny, *  Diagnosis:    53 y.o. 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        ICD-10-CM   1. 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, estrogen receptor negative ([place]) C50.511    Z17.1   2. 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 Pelvis / 30 Gy in 10 fractions (right inguinal nodes) 2. Left Supraclavicular / 30 Gy in 10 fractions 3. Abdomen / 20 Gy in 8 fractions (subcutaneous Met)  07/11/2017 - 07/22/2017 palliative XRT for painful subcutaneous nodules 1. Left Flank / 30 Gy in 10 fractions 2. Left Groin / 30 Gy in 10 fractions  07/26/16 Preop SRS Treatment: PTV1 Right Cerebellum was treated to 18 Gy in 1 fraction  07/23/15-09/05/15: 50.4 Gy to the right breast + 10 Gy boost  Narrative:   Kaitlyn Keith is a pleasant 53 y.o. 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 She was diagnosed with her cancer in July 2016, and underwent neoadjuvant chemotherapy followed by right lumpectomy and adjuvant radiation. She was found to have recurrent disease in February 2018 with a right cerebellar mass, and underwent preoperative SRS treatment followed by surgical resection on 07/29/2016. She is being followed in our brain oncology conference, and her last MRI scan on 7/27//19 revealed stability of her previously treated site of disease, with stability of her resection cavity, and no evidence for recurrent or residual metastatic disease to the brain. She comes today to review these findings.  She developed multiple subcutaneous nodules in  the neck, upper back, left arm, abdomen and pelvis, noted on PET scan from 11/17/16. She was started on Everolimus on 12/31/16 but was discontinued 04/11/17 due to disease progression with interval development of subcutaneous nodules in the ventral lower left pelvic wall and left flank and interval growth of 3 scattered peritoneal metastases. She started Xeloda in 05/2017 but discontinued due to disease progression on re-staging scans from 06/27/17 which showed a mixed response to therapy with some regression of the previously noted intraperitoneal implants but other lesions with significant interval growth including subcutaneous nodules in the left flank, left vulvar region, right inguinal LAN and anterior mediastinal LAN. She elected to move forward with palliative XRT to 2 painful subcutaneous lesions in the left flank and left pubic/vulvar region which was completed in March 2019.  She tolerated radiotherapy very well and had an excellent response with decreased size of both treated lesions.  Her systemic chemotherapy was switched to [facility] but this was discontinued in October 2019 due to evidence of disease progression on follow-up CT C/A/P from 02/02/2018 indicating continued progression of right inguinal lymph node and right inguinal lymphadenopathy.  She underwent a CT-guided biopsy of the right inguinal lymph node on 03/02/2018 with final pathology confirming metastatic, poorly differentiated carcinoma, ER/PR negative and HER-2 negative.  Her systemic therapy was changed to pembrolizumab immunotherapy at that time and she was also referred back to radiation oncology for consideration of palliative radiotherapy to the painful, progressive right inguinal lymphadenopathy.  At the time of her consult on  03/15/2018, she also mentioned that she had recently developed pain in the left supraclavicular region as well as in the left flank area.  She elected to proceed with palliative radiotherapy to the 3 sites of painful  metastatic disease in the right inguinal, left supraclavicular and left abdomen/flank and this was completed on 03/31/2018.  She tolerated the radiation well and did have significant improvement in her pain.  She has now completed 5 cycles of Keytruda (pembrolizumab) and her most recent CT C/A/P from 06/12/2018 showed decrease in size of the left supraclavicular lymph node and nodule in the groin and no change in size of the lesion in the liver.  She met back with Dr. Payton Mccallum on 06/22/2018 and the recommendation is to continue with her current immunotherapy with [facility] every 3 weeks.   On review of systems, the patient reports that she is doing well overall. She denies any chest pain, shortness of breath, cough, fevers, chills, night sweats, or unintended weight changes. She denies any bowel or bladder disturbances, and denies abdominal pain, nausea or vomiting. She denies new visual or auditory changes, dizziness, imbalance, tremors or seizure activity. She has been having occasional headaches, often occurring in the mornings but do not wake her from sleep and resolve on their own without need for tylenol or advil.  She denies associated N/V with headaches and rates the HA 1-2 out of 10 on the pain scale. She denies any new musculoskeletal or joint aches or pains, new skin lesions or concerns. A complete review of systems is obtained and is otherwise negative.   Past Medical History:  Past Medical History:  Diagnosis Date  . Anxiety   . Arthritis   . Back pain   . Brain cancer ([place])    brian met from triple negative breast ca  . Breast cancer ([place])   . Breast cancer of lower-outer quadrant of right female breast ([place]) 12/05/2014  . Depression   . FH: chemotherapy 12/2014-04/2015  . Genetic testing 12/10/2016   Kaitlyn Keith underwent genetic counseling and testing for hereditary cancer syndromes on 11/18/2016. Her results are positive for a pathogenic mutation in MSH6 called [id number]  (p.Ile944Metfs*4). Mutations in MSH6 are associated with a hereditary cancer syndrome called Lynch syndrome. For more detailed discussion, please see genetic counseling documentation from 12/10/2016.  Testing was perfo  . Headache    due to brain cancer, no longer having them  . Hot flashes   . Hypertension   . 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 Mutations in MSH6 are associated with a hereditary cancer syndrome called Lynch syndrome. For more detailed discussion, please see genetic counseling documentation from 12/10/2016.  Testing was perfo  . 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 Radiation 07/23/15-09/05/15   right breast 50.4 Gy, boost of 10 Gy    Past Surgical History: Past Surgical History:  Procedure Laterality Date  . APPLICATION OF CRANIAL NAVIGATION Right 07/27/2016   Procedure: APPLICATION OF CRANIAL NAVIGATION;  Surgeon: Kevan Ny Ditty, MD;  Location: [facility];  Service: Neurosurgery;  Laterality: Right;  . BIOPSY OF SKIN SUBCUTANEOUS TISSUE AND/OR MUCOUS MEMBRANE Left 12/24/2016   Procedure: OPEN BIOPSY LESIONS ON LEFT LOWER BACK AND SHOULDER BLADE;  Surgeon: [name], MD;  Location: [facility];  Service: General;  Laterality: Left;  .  BREAST LUMPECTOMY WITH NEEDLE LOCALIZATION AND AXILLARY SENTINEL LYMPH NODE BX Right 06/16/2015   Procedure: BREAST LUMPECTOMY WITH NEEDLE LOCALIZATION AND AXILLARY SENTINEL LYMPH NODE BX;  Surgeon: [name], MD;  Location: [facility];  Service: General;  Laterality: Right;  . BREAST REDUCTION SURGERY    . CRANIOTOMY Right 07/27/2016   Procedure: Right Suboccipital craniotomy for tumor resection with stereotactic navigation;  Surgeon: Kevan Ny Ditty, MD;  Location: [facility];  Service:  Neurosurgery;  Laterality: Right;  . PORT-A-CATH REMOVAL N/A 06/16/2015   Procedure: REMOVAL PORT-A-CATH;  Surgeon: [name], MD;  Location: [facility];  Service: General;  Laterality: N/A;  . PORTACATH PLACEMENT N/A 12/23/2014   Procedure: INSERTION PORT-A-CATH;  Surgeon: [name], MD;  Location: [facility];  Service: General;  Laterality: N/A;  . PORTACATH PLACEMENT Left 07/08/2017   Procedure: INSERTION PORT-A-CATH;  Surgeon: [name], MD;  Location: [place];  Service: General;  Laterality: Left;    Social History:  Social History   Socioeconomic History  . Marital status: Divorced    Spouse name: Not on file  . Number of children: 1  . Years of education: Not on file  . Highest education level: Not on file  Occupational History  . Not on file  Social Needs  . Financial resource strain: Not on file  . Food insecurity:    Worry: Not on file    Inability: Not on file  . Transportation needs:    Medical: No    Non-medical: No  Tobacco Use  . Smoking status: Never Smoker  . Smokeless tobacco: Never Used  Substance and Sexual Activity  . Alcohol use: Yes    Comment: social  . Drug use: No  . Sexual activity: Yes  Lifestyle  . Physical activity:    Days per week: Not on file    Minutes per session: Not on file  . Stress: Not on file  Relationships  . Social connections:    Talks on phone: Not on file    Gets together: Not on file    Attends religious service: Not on file    Active member of club or organization: Not on file    Attends meetings of clubs or organizations: Not on file    Relationship status: Not on file  . Intimate partner violence:    Fear of current or ex partner: No    Emotionally abused: No    Physically abused: No    Forced sexual activity: No  Other Topics Concern  . Not on file  Social History Narrative  . Not on file  The patient is divorced. She has worked for [employer]  firm, and is accompanied today by her sister  Family History: Family History  Problem Relation Age of Onset  . Aneurysm Mother 37       d.55  . Heart attack Father 70       d.62  . Endometrial cancer Sister 56  . Lung cancer Maternal Uncle        d.68s  . Cancer Maternal Grandmother        unspecified type-possibly stomach d.88s                          ALLERGIES:  has No Known Allergies.  Meds: Current Outpatient Medications  Medication Sig Dispense Refill  . ALPRAZolam (XANAX) 2 MG tablet Take 1 tablet (  2 mg total) by mouth at bedtime. 30 tablet 5  . betamethasone valerate ointment (VALISONE) 0.1 % Apply 1 application topically 2 (two) times daily. 30 g 1  . cyclobenzaprine (FLEXERIL) 5 MG tablet Take 1 tablet (5 mg total) by mouth 3 (three) times daily as needed for muscle spasms. 30 tablet 0  . gabapentin (NEURONTIN) 100 MG capsule Take 1 capsule (100 mg total) by mouth at bedtime. 30 capsule 6  . levothyroxine (SYNTHROID, LEVOTHROID) 150 MCG tablet Take 1 tablet (150 mcg total) by mouth daily before breakfast. 30 tablet 6  . lidocaine-prilocaine (EMLA) cream APPLY TO AFFECTED AREA ONCE AS DIRECTED 30 g 3  . lisinopril-hydrochlorothiazide (PRINZIDE,ZESTORETIC) 20-12.5 MG tablet Take 1 tablet by mouth 2 (two) times daily. 180 tablet 3   No current facility-administered medications for this encounter.     Physical Findings:   height is '5\' 3"'$  (1.6 m) and weight is 184 lb 6.4 oz (83.6 kg). Her oral temperature is 98.1 F (36.7 C). Her blood pressure is 130/88 and her pulse is 73. Her respiration is 20 and oxygen saturation is 100%.   Pain Assessment Pain Score: 0-No pain/10  In general this is a well appearing African American female in no acute distress. She's alert and oriented x4 and appropriate throughout the examination. Cardiopulmonary assessment is negative for acute distress and she exhibits normal effort.  She appears grossly neurologically intact with normal EOMs  bilaterally and PERRLA.  Strength is 5/5 and symmetric bilaterally and sensation is intact to light touch in the upper and lower extremities.   Lab Findings: Lab Results  Component Value Date   WBC 3.6 (L) 06/22/2018   WBC 3.0 (L) 03/02/2018   HGB 12.7 06/22/2018   HGB 11.7 04/04/2017   HCT 39.1 06/22/2018   HCT 35.9 04/04/2017   PLT 152 06/22/2018   PLT 207 04/04/2017    Lab Results  Component Value Date   NA 140 06/22/2018   NA 139 04/04/2017   K 3.4 (L) 06/22/2018   K 3.7 04/04/2017   CHLORIDE 108 04/04/2017   CO2 27 06/22/2018   CO2 24 04/04/2017   GLUCOSE 107 (H) 06/22/2018   GLUCOSE 76 04/04/2017   BUN 8 06/22/2018   BUN 5.8 (L) 04/04/2017   CREATININE 0.71 06/22/2018   CREATININE 0.8 04/04/2017   BILITOT 0.7 06/22/2018   BILITOT 0.43 04/04/2017   ALKPHOS 91 06/22/2018   ALKPHOS 131 04/04/2017   AST 16 06/22/2018   AST 18 04/04/2017   ALT 12 06/22/2018   ALT 12 04/04/2017   PROT 7.3 06/22/2018   PROT 7.0 04/04/2017   ALBUMIN 3.8 06/22/2018   ALBUMIN 3.0 (L) 04/04/2017   CALCIUM 9.0 06/22/2018   CALCIUM 8.6 04/04/2017   ANIONGAP 6 06/22/2018    Radiographic Findings: Ct Chest W Contrast  Result Date: 06/12/2018 CLINICAL DATA:  Metastatic breast cancer. EXAM: CT CHEST, ABDOMEN, AND PELVIS WITH CONTRAST TECHNIQUE: Multidetector CT imaging of the chest, abdomen and pelvis was performed following the standard protocol during bolus administration of intravenous contrast. CONTRAST:  155m OMNIPAQUE IOHEXOL 300 MG/ML  SOLN COMPARISON:  02/02/2018 FINDINGS: CT CHEST FINDINGS Cardiovascular: The heart size is normal. No substantial pericardial effusion. Left Port-A-Cath tip is positioned in the proximal to mid SVC. Mediastinum/Nodes: Left supraclavicular lesion identified on the previous study has decreased, measuring 7 mm short axis today compared to 13 mm short axis previously. No mediastinal lymphadenopathy. There is no hilar lymphadenopathy. The esophagus has normal  imaging features. There  is no axillary lymphadenopathy. Surgical clips again noted right axilla. Lungs/Pleura: The central tracheobronchial airways are patent. No suspicious nodule or mass. No focal consolidation. No pleural effusion. Musculoskeletal: No worrisome lytic or sclerotic osseous abnormality. CT ABDOMEN PELVIS FINDINGS Hepatobiliary: 6 mm inferior right liver lesion adjacent to the gallbladder fossa (59/2) is stable in the interval. There is no evidence for gallstones, gallbladder wall thickening, or pericholecystic fluid. No intrahepatic or extrahepatic biliary dilation. Pancreas: No focal mass lesion. No dilatation of the main duct. No intraparenchymal cyst. No peripancreatic edema. Spleen: No splenomegaly. No focal mass lesion. Adrenals/Urinary Tract: No adrenal nodule or mass. Kidneys unremarkable No evidence for hydroureter. The urinary bladder appears normal for the degree of distention. Stomach/Bowel: Stomach is nondistended. No gastric wall thickening. No evidence of outlet obstruction. Duodenum is normally positioned as is the ligament of Treitz. No small bowel wall thickening. No small bowel dilatation. The terminal ileum is normal. Appendix is distended as before but otherwise unremarkable. No gross colonic mass. No colonic wall thickening. Vascular/Lymphatic: No abdominal aortic aneurysm. No abdominal aortic atherosclerotic calcification. There is no gastrohepatic or hepatoduodenal ligament lymphadenopathy. No intraperitoneal or retroperitoneal lymphadenopathy. No pelvic sidewall lymphadenopathy. 11 mm short axis right groin lymph node measured on the prior study is 9 mm today (109/2). Reproductive: Uterus unremarkable.  There is no adnexal mass. Other: No intraperitoneal free fluid. Musculoskeletal: No worrisome lytic or sclerotic osseous abnormality.Subcutaneous irregular nodule in the region of the right groin measured previously at 2.2 x 1.7 cm now measures 0.8 x 0.6 cm. IMPRESSION: 1.  Interval decrease in the left supraclavicular lymph node and the metastatic lesion in the subcutaneous fat of the right groin region has also decreased in the interval. No new or progressive interval findings. 2. No change 6 mm hypoattenuating lesion in the right liver adjacent to the gallbladder. Electronically Signed   By: Misty Stanley M.D.   On: 06/12/2018 08:08   Mr Jeri Cos XH Contrast  Result Date: 06/30/2018 CLINICAL DATA:  Metastatic breast cancer. Postop craniotomy with radiation. EXAM: MRI HEAD WITHOUT AND WITH CONTRAST TECHNIQUE: Multiplanar, multiecho pulse sequences of the brain and surrounding structures were obtained without and with intravenous contrast. CONTRAST:  9 mL Gadovist IV COMPARISON:  MRI 03/24/2018 and 12/17/2017 FINDINGS: Brain: Right occipital craniotomy for resection of tumor in the right cerebellum. Postoperative changes in the right cerebellum are stable. No recurrent mass or edema. Ventricle size normal. No enhancing mass lesions in the brain. Negative for acute infarct or hemorrhage. Vascular: Normal arterial flow voids Skull and upper cervical spine: Anterolisthesis at C2-3 approximately 6 mm unchanged likely due to chronic healed fracture. No acute skeletal abnormality. Sinuses/Orbits: Negative Other: None IMPRESSION: Stable MRI. Postop tumor resection right cerebellum. Negative for metastatic disease. Electronically Signed   By: Franchot Gallo M.D.   On: 06/30/2018 09:33   Ct Abdomen Pelvis W Contrast  Result Date: 06/12/2018 CLINICAL DATA:  Metastatic breast cancer. EXAM: CT CHEST, ABDOMEN, AND PELVIS WITH CONTRAST TECHNIQUE: Multidetector CT imaging of the chest, abdomen and pelvis was performed following the standard protocol during bolus administration of intravenous contrast. CONTRAST:  125m OMNIPAQUE IOHEXOL 300 MG/ML  SOLN COMPARISON:  02/02/2018 FINDINGS: CT CHEST FINDINGS Cardiovascular: The heart size is normal. No substantial pericardial effusion. Left  Port-A-Cath tip is positioned in the proximal to mid SVC. Mediastinum/Nodes: Left supraclavicular lesion identified on the previous study has decreased, measuring 7 mm short axis today compared to 13 mm short axis previously. No mediastinal lymphadenopathy. There  is no hilar lymphadenopathy. The esophagus has normal imaging features. There is no axillary lymphadenopathy. Surgical clips again noted right axilla. Lungs/Pleura: The central tracheobronchial airways are patent. No suspicious nodule or mass. No focal consolidation. No pleural effusion. Musculoskeletal: No worrisome lytic or sclerotic osseous abnormality. CT ABDOMEN PELVIS FINDINGS Hepatobiliary: 6 mm inferior right liver lesion adjacent to the gallbladder fossa (59/2) is stable in the interval. There is no evidence for gallstones, gallbladder wall thickening, or pericholecystic fluid. No intrahepatic or extrahepatic biliary dilation. Pancreas: No focal mass lesion. No dilatation of the main duct. No intraparenchymal cyst. No peripancreatic edema. Spleen: No splenomegaly. No focal mass lesion. Adrenals/Urinary Tract: No adrenal nodule or mass. Kidneys unremarkable No evidence for hydroureter. The urinary bladder appears normal for the degree of distention. Stomach/Bowel: Stomach is nondistended. No gastric wall thickening. No evidence of outlet obstruction. Duodenum is normally positioned as is the ligament of Treitz. No small bowel wall thickening. No small bowel dilatation. The terminal ileum is normal. Appendix is distended as before but otherwise unremarkable. No gross colonic mass. No colonic wall thickening. Vascular/Lymphatic: No abdominal aortic aneurysm. No abdominal aortic atherosclerotic calcification. There is no gastrohepatic or hepatoduodenal ligament lymphadenopathy. No intraperitoneal or retroperitoneal lymphadenopathy. No pelvic sidewall lymphadenopathy. 11 mm short axis right groin lymph node measured on the prior study is 9 mm today  (109/2). Reproductive: Uterus unremarkable.  There is no adnexal mass. Other: No intraperitoneal free fluid. Musculoskeletal: No worrisome lytic or sclerotic osseous abnormality.Subcutaneous irregular nodule in the region of the right groin measured previously at 2.2 x 1.7 cm now measures 0.8 x 0.6 cm. IMPRESSION: 1. Interval decrease in the left supraclavicular lymph node and the metastatic lesion in the subcutaneous fat of the right groin region has also decreased in the interval. No new or progressive interval findings. 2. No change 6 mm hypoattenuating lesion in the right liver adjacent to the gallbladder. Electronically Signed   By: Misty Stanley M.D.   On: 06/12/2018 08:08    Impression/Plan: 1. 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  She continues to be both clinically and radiographically stable, with her most recent MRI brain from 06/30/18 showing no evidence of recurrent or residual metastatic disease to the brain.  The recommendation is to continue with serial surveillance MRI brain scans every 3 months to monitor for disease recurrence or progression.   Her case and imaging will be reviewed in the multidisciplinary brain conference prior to a follow-up visit to discuss results and recommendations at that time.  Her most recent systemic imaging from 06/12/18 indicates a positive response to systemic treatment and recent palliative radiotehrapy.  She met with her medical oncologist in follow-up 06/22/18 and the recommendation is to continue with Keytruda q 3 weeks for systemic disease management under the care and direction of Dr. Lindi Adie.  She will have repeat systemic imaging in 3 months.  She is in agreement with this plan.     Nicholos Johns, PA-C

## 2018-07-12 NOTE — Progress Notes (Signed)
Patient Care Team: [name], MD as PCP - General (Hematology and Oncology) [name], [name], [name], [name], [name], [name], NP (Inactive) as Nurse Practitioner (Nurse Practitioner)  DIAGNOSIS:    ICD-10-CM   1. 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, estrogen receptor negative ([place]) C50.511    Z17.1     SUMMARY OF ONCOLOGIC HISTORY:   Breast cancer of lower-outer quadrant of right female breast ([place])   12/03/2014 Mammogram    Right breast mass 1.9 cm it o'clock position 8 cm depth from the nipple    12/03/2014 Initial Diagnosis    Right breast biopsy 8:00: Invasive ductal carcinoma, grade 3, ER 0%, PR 0%, Ki-67 90%, HER-2 negative ratio 1.43    12/10/2014 Breast MRI    Right breast lower outer quadrant: 2.3 x 2.4 x 2.4 cm rim-enhancing mass abuts the pectoralis fascia but no enhancement of pectoralis muscle, second focus of artifact?'s second tissue marker clip, no lymph nodes    12/10/2014 Clinical Stage    Stage IIA: T2 N0    12/24/2014 - 04/29/2015 Neo-Adjuvant Chemotherapy    Dose dense Adriamycin and Cytoxan 4 followed by weekly Abraxane 12    05/02/2015 Breast MRI    complete radiologic response    06/16/2015 Surgery    Left Lumpectomy: Complete path Response, 0/2 LN    06/16/2015 Pathologic Stage    ypT0 ypN0    07/23/2015 - 09/05/2015 Radiation Therapy    Adjuvant RT: 50.4 Gy in 28 fractions and a boost of 10 Gy in 5 fractions to total dose of 60.4 Gy    10/24/2015 Survivorship    SCP mailed to patient in lieu of in person visit.    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 identified in BARD 1, BRCA2 and NF1    12/31/2016 Miscellaneous    Everolimus 10 mg daily for cycle 1 if she cannot tolerate will decrease to 7.5 mg daily    05/24/2017 - 07/04/2017 Chemotherapy    Xeloda 2000 mg 2 weeks on 1 week off stopped due to progression of disease based on CT scans done 06/27/2017    07/25/2017 - 03/13/2018 Chemotherapy    Halaven days 1 and 8 every 3 weeks stopped for progression     03/20/2018 - 03/31/2018 Radiation Therapy    Radiation to lymph nodes    04/03/2018 -  Chemotherapy    Keytruda every 3 weeks      CHIEF COMPLIANT: Cycle 6 of Keytruda  INTERVAL HISTORY: Kaitlyn Keith is a 53 y.o. with above-mentioned history  of triple negative metastatic breast cancer here for her sixth cycle of Keytruda. A brain MRI on 06/30/18 was stable and negative for metastatic disease. She presents to the clinic alone today. She reports fatigue for the last few days but increased energy for a few days prior to that. Her appetite has decreased resulting in recent weight loss, but she reports normal bowel movements. She takes a potassium supplement. Her labs from 06/22/18 showed TSH 0.20. Her labs from today show: WBC 3.9, platelets 153, Hg 12.9, ANC 2.2.   REVIEW OF SYSTEMS:   Constitutional: Denies fevers, chills or  abnormal weight loss (+) fatigue (+) decreased appetite Eyes: Denies blurriness of vision Ears, nose, mouth, throat, and face: Denies mucositis or sore throat Respiratory: Denies cough, dyspnea or wheezes Cardiovascular: Denies palpitation, chest discomfort Gastrointestinal: Denies nausea, heartburn or change in bowel habits Skin: Denies abnormal skin rashes Lymphatics: Denies new lymphadenopathy or easy bruising Neurological: Denies numbness, tingling or new weaknesses Behavioral/Psych: Mood is stable, no new changes  Extremities: No lower extremity edema Breast: denies any pain or lumps or nodules in either breasts All other systems were reviewed with the patient and are negative.  I have reviewed the past medical history, past surgical history, social history and family history with the patient and they are unchanged from previous note.  ALLERGIES:  has No Known Allergies.  MEDICATIONS:  Current Outpatient Medications  Medication Sig Dispense Refill  . ALPRAZolam (XANAX) 2 MG tablet Take 1 tablet (2 mg total) by mouth at bedtime. 30 tablet 5  . betamethasone valerate ointment (VALISONE) 0.1 % Apply 1 application topically 2 (two) times daily. 30 g 1  . cyclobenzaprine (FLEXERIL) 5 MG tablet Take 1 tablet (5 mg total) by mouth 3 (three) times daily as needed for muscle spasms. 30 tablet 0  . gabapentin (NEURONTIN) 100 MG capsule Take 1 capsule (100 mg total) by mouth at bedtime. 30 capsule 6  . levothyroxine (SYNTHROID, LEVOTHROID) 125 MCG tablet Take 1 tablet (125 mcg total) by mouth daily before breakfast. 30 tablet 1  . lidocaine-prilocaine (EMLA) cream APPLY TO AFFECTED AREA ONCE AS DIRECTED 30 g 3  . lisinopril-hydrochlorothiazide (PRINZIDE,ZESTORETIC) 20-12.5 MG tablet Take 1 tablet by mouth 2 (two) times daily. 180 tablet 3   No current facility-administered medications for this visit.    Facility-Administered Medications Ordered in Other Visits  Medication Dose Route  Frequency Provider Last Rate Last Dose  . sodium chloride flush (NS) 0.9 % injection 10 mL  10 mL Intracatheter PRN [name], MD   10 mL at 07/13/18 1046    PHYSICAL EXAMINATION: ECOG PERFORMANCE STATUS: 1 - Symptomatic but completely ambulatory  Vitals:   07/13/18 1101  BP: 129/83  Pulse: 61  Resp: 17  Temp: 98.2 F (36.8 C)  SpO2: 97%   Filed Weights   07/13/18 1101  Weight: 178 lb 1.6 oz (80.8 kg)    GENERAL: alert, no distress and comfortable SKIN: skin color, texture, turgor are normal, no rashes or significant lesions EYES: normal, Conjunctiva are pink and non-injected, sclera clear OROPHARYNX: no exudate, no erythema and lips, buccal mucosa, and tongue normal  NECK: supple, thyroid normal size, non-tender, without nodularity LYMPH: no palpable lymphadenopathy in the cervical, axillary or inguinal LUNGS: clear to auscultation and percussion with normal breathing effort HEART: regular rate & rhythm and no murmurs and no lower extremity edema ABDOMEN: abdomen soft, non-tender and normal bowel sounds MUSCULOSKELETAL: no cyanosis of digits and no clubbing  NEURO: alert &  oriented x 3 with fluent speech, no focal motor/sensory deficits EXTREMITIES: No lower extremity edema  LABORATORY DATA:  I have reviewed the data as listed CMP Latest Ref Rng & Units 06/22/2018 06/01/2018 05/12/2018  Glucose 70 - 99 mg/dL 107(H) 87 82  BUN 6 - 20 mg/dL '8 11 10  ' Creatinine 0.44 - 1.00 mg/dL 0.71 0.84 0.76  Sodium 135 - 145 mmol/L 140 140 138  Potassium 3.5 - 5.1 mmol/L 3.4(L) 3.6 3.8  Chloride 98 - 111 mmol/L 107 106 105  CO2 22 - 32 mmol/L '27 25 26  ' Calcium 8.9 - 10.3 mg/dL 9.0 8.9 8.9  Total Protein 6.5 - 8.1 g/dL 7.3 7.8 7.4  Total Bilirubin 0.3 - 1.2 mg/dL 0.7 0.5 0.5  Alkaline Phos 38 - 126 U/L 91 86 93  AST 15 - 41 U/L '16 18 23  ' ALT 0 - 44 U/L '12 12 18    ' Lab Results  Component Value Date   WBC 3.9 (L) 07/13/2018   HGB 12.9 07/13/2018   HCT 39.1 07/13/2018   MCV 86.5  07/13/2018   PLT 153 07/13/2018   NEUTROABS 2.2 07/13/2018    ASSESSMENT & PLAN:  Breast cancer of lower-outer quadrant of right female breast ([place]) Right breast biopsy 12/03/2014 8:00: Invasive ductal carcinoma, grade 3, ER 0%, PR 0%, 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 Neoadj chemo 12/24/14- 04/29/15 AC x 4 foll by Abraxane X 12 Rt Lumpectomy: Path CR 0/2 LN Adj XRT 07/23/15- 09/08/15 PET/CT scan 11/17/2016:Subcutaneous nodules in the neck, upper back, left arm, abdomen and pelvis Patient progressed on Xeloda January 2019-07/04/2017 stopped due to progression of disease Cerebellar mass diagnosed 07/12/2016: Resection followed by stereotactic radiation 07/29/2016 Lymph node from 03/02/2018 biopsy: PDL1+ ------------------------------------------------------------------------------------------------------------------------------------------------ Current treatment:Pembrolizumab given every 3 weeks starting 04/03/18, today's cycle 6 Keytruda toxicities:Hypothyroidism for which she required thyroid replacement therapy TSH 0.2 on 06/22/2018 We will decrease the dosage of Synthroid  CT CAP 06/12/2018: Interval decrease in the left supraclavicular lymph node and the metastatic lesion in the subcutaneous fat of the right groin region also decreased.  No change in the 6 mm lesion in the right liver  Plan: Continue with the current therapy return to clinic every 3 weeks for pembrolizumab. Plan to give 3 more cycles of pembrolizumab before obtaining scans. Monitoring closely for toxicities.    No orders of the defined types were placed in this encounter.  The patient has a good understanding of the overall plan. she agrees with it. she will call with any problems that may develop before the next visit here.  [name], MD 07/13/2018  Julious Oka Dorshimer am acting as  scribe for Dr. [name].  I have reviewed the above documentation for accuracy and completeness, and I agree with the above.

## 2018-07-13 ENCOUNTER — Inpatient Hospital Stay: Payer: BLUE CROSS/BLUE SHIELD

## 2018-07-13 ENCOUNTER — Inpatient Hospital Stay (HOSPITAL_BASED_OUTPATIENT_CLINIC_OR_DEPARTMENT_OTHER): Payer: BLUE CROSS/BLUE SHIELD | Admitting: Hematology and Oncology

## 2018-07-13 DIAGNOSIS — C50511 Malignant neoplasm of lower-outer quadrant of right female breast: Secondary | ICD-10-CM

## 2018-07-13 DIAGNOSIS — Z171 Estrogen receptor negative status [ER-]: Secondary | ICD-10-CM

## 2018-07-13 DIAGNOSIS — E039 Hypothyroidism, unspecified: Secondary | ICD-10-CM

## 2018-07-13 DIAGNOSIS — C7931 Secondary malignant neoplasm of brain: Secondary | ICD-10-CM

## 2018-07-13 DIAGNOSIS — C792 Secondary malignant neoplasm of skin: Secondary | ICD-10-CM

## 2018-07-13 DIAGNOSIS — Z5112 Encounter for antineoplastic immunotherapy: Secondary | ICD-10-CM | POA: Diagnosis present

## 2018-07-13 LAB — CMP (CANCER CENTER ONLY)
ALK PHOS: 83 U/L (ref 38–126)
ALT: 8 U/L (ref 0–44)
ANION GAP: 9 (ref 5–15)
AST: 15 U/L (ref 15–41)
Albumin: 3.7 g/dL (ref 3.5–5.0)
BUN: 8 mg/dL (ref 6–20)
CALCIUM: 8.8 mg/dL — AB (ref 8.9–10.3)
CO2: 25 mmol/L (ref 22–32)
Chloride: 105 mmol/L (ref 98–111)
Creatinine: 0.76 mg/dL (ref 0.44–1.00)
GFR, Est AFR Am: >60 mL/min (ref >60)
GFR, Estimated: >60 mL/min (ref >60)
Glucose, Bld: 114 mg/dL — ABNORMAL HIGH (ref 70–99)
Potassium: 3.4 mmol/L — ABNORMAL LOW (ref 3.5–5.1)
Sodium: 139 mmol/L (ref 135–145)
Total Bilirubin: 0.9 mg/dL (ref 0.3–1.2)
Total Protein: 7.2 g/dL (ref 6.5–8.1)

## 2018-07-13 LAB — CBC WITH DIFFERENTIAL (CANCER CENTER ONLY)
Abs Immature Granulocytes: 0.01 10*3/uL (ref 0.00–0.07)
Basophils Absolute: 0 10*3/uL (ref 0.0–0.1)
Basophils Relative: 1 %
Eosinophils Absolute: 0.1 10*3/uL (ref 0.0–0.5)
Eosinophils Relative: 2 %
HCT: 39.1 % (ref 36.0–46.0)
Hemoglobin: 12.9 g/dL (ref 12.0–15.0)
Immature Granulocytes: 0 %
Lymphocytes Relative: 31 %
Lymphs Abs: 1.2 10*3/uL (ref 0.7–4.0)
MCH: 28.5 pg (ref 26.0–34.0)
MCHC: 33 g/dL (ref 30.0–36.0)
MCV: 86.5 fL (ref 80.0–100.0)
Monocytes Absolute: 0.4 10*3/uL (ref 0.1–1.0)
Monocytes Relative: 9 %
Neutro Abs: 2.2 10*3/uL (ref 1.7–7.7)
Neutrophils Relative %: 57 %
Platelet Count: 153 10*3/uL (ref 150–400)
RBC: 4.52 MIL/uL (ref 3.87–5.11)
RDW: 13.5 % (ref 11.5–15.5)
WBC Count: 3.9 10*3/uL — ABNORMAL LOW (ref 4.0–10.5)
nRBC: 0 % (ref 0.0–0.2)

## 2018-07-13 LAB — TSH: TSH: 0.116 u[IU]/mL — ABNORMAL LOW (ref 0.308–3.960)

## 2018-07-13 MED ORDER — SODIUM CHLORIDE 0.9% FLUSH
10.0000 mL | INTRAVENOUS | Status: DC | PRN
  Administered 2018-07-13: 10 mL
  Filled 2018-07-13: qty 10

## 2018-07-13 MED ORDER — SODIUM CHLORIDE 0.9 % IV SOLN
200.0000 mg | Freq: Once | INTRAVENOUS | Status: AC
  Administered 2018-07-13: 200 mg via INTRAVENOUS
  Filled 2018-07-13: qty 8

## 2018-07-13 MED ORDER — HEPARIN SOD (PORK) LOCK FLUSH 100 UNIT/ML IV SOLN
500.0000 [IU] | Freq: Once | INTRAVENOUS | Status: AC | PRN
  Administered 2018-07-13: 500 [IU]
  Filled 2018-07-13: qty 5

## 2018-07-13 MED ORDER — LEVOTHYROXINE SODIUM 125 MCG PO TABS: 125.0000 ug | ORAL_TABLET | Freq: Every day | ORAL | 1 refills | Status: DC

## 2018-07-13 MED ORDER — SODIUM CHLORIDE 0.9 % IV SOLN
Freq: Once | INTRAVENOUS | Status: AC
  Administered 2018-07-13: 12:00:00 via INTRAVENOUS
  Filled 2018-07-13: qty 250

## 2018-07-13 NOTE — Patient Instructions (Signed)
[facility] Discharge Instructions for Patients Receiving Chemotherapy  Today you received the following chemotherapy agents :  Keytruda.  To help prevent nausea and vomiting after your treatment, we encourage you to take your nausea medication as prescribed.   If you develop nausea and vomiting that is not controlled by your nausea medication, call the clinic.   BELOW ARE SYMPTOMS THAT SHOULD BE REPORTED IMMEDIATELY:  *FEVER GREATER THAN 100.5 F  *CHILLS WITH OR WITHOUT FEVER  NAUSEA AND VOMITING THAT IS NOT CONTROLLED WITH YOUR NAUSEA MEDICATION  *UNUSUAL SHORTNESS OF BREATH  *UNUSUAL BRUISING OR BLEEDING  TENDERNESS IN MOUTH AND THROAT WITH OR WITHOUT PRESENCE OF ULCERS  *URINARY PROBLEMS  *BOWEL PROBLEMS  UNUSUAL RASH Items with * indicate a potential emergency and should be followed up as soon as possible.  Feel free to call the clinic should you have any questions or concerns. The clinic phone number is [phone].  Please show the CHEMO ALERT CARD at check-in to the Emergency Department and triage nurse.  

## 2018-07-13 NOTE — Assessment & Plan Note (Signed)
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 Neoadj chemo 12/24/14- 04/29/15 AC x 4 foll by Abraxane X 12 Rt Lumpectomy: Path CR 0/2 LN Adj XRT 07/23/15- 09/08/15 PET/CT scan 11/17/2016:Subcutaneous nodules in the neck, upper back, left arm, abdomen and pelvis Patient progressed on Xeloda January 2019-07/04/2017 stopped due to progression of disease Cerebellar mass diagnosed 07/12/2016: Resection followed by stereotactic radiation 07/29/2016 Lymph node from 03/02/2018 biopsy: PDL1+ ------------------------------------------------------------------------------------------------------------------------------------------------ Current treatment:Pembrolizumab given every 3 weeks starting 04/03/18, today's cycle 6 Keytruda toxicities:Hypothyroidism for which she required thyroid replacement therapy TSH 0.2 on 06/22/2018 We will decrease the dosage of Synthroid  CT CAP 06/12/2018: Interval decrease in the left supraclavicular lymph node and the metastatic lesion in the subcutaneous fat of the right groin region also decreased.  No change in the 6 mm lesion in the right liver  Plan: Continue with the current therapy return to clinic every 3 weeks for pembrolizumab. Plan to give 3 more cycles of pembrolizumab before obtaining scans. Monitoring closely for toxicities.

## 2018-07-19 ENCOUNTER — Other Ambulatory Visit: Payer: Self-pay | Admitting: Hematology and Oncology

## 2018-08-02 NOTE — Progress Notes (Signed)
Patient Care Team: [name], MD as PCP - General (Hematology and Oncology) [name], [name], [name], [name], [name], [name], NP (Inactive) as Nurse Practitioner (Nurse Practitioner)  DIAGNOSIS:    ICD-10-CM   1. 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, estrogen receptor negative ([place]) C50.511    Z17.1     SUMMARY OF ONCOLOGIC HISTORY:   Breast cancer of lower-outer quadrant of right female breast ([place])   12/03/2014 Mammogram    Right breast mass 1.9 cm it o'clock position 8 cm depth from the nipple    12/03/2014 Initial Diagnosis    Right breast biopsy 8:00: Invasive ductal carcinoma, grade 3, ER 0%, PR 0%, Ki-67 90%, HER-2 negative ratio 1.43    12/10/2014 Breast MRI    Right breast lower outer quadrant: 2.3 x 2.4 x 2.4 cm rim-enhancing mass abuts the pectoralis fascia but no enhancement of pectoralis muscle, second focus of artifact?'s second tissue marker clip, no lymph nodes    12/10/2014 Clinical Stage    Stage IIA: T2 N0    12/24/2014 - 04/29/2015 Neo-Adjuvant Chemotherapy    Dose dense Adriamycin and Cytoxan 4 followed by weekly Abraxane 12    05/02/2015 Breast MRI    complete radiologic response    06/16/2015 Surgery    Left Lumpectomy: Complete path Response, 0/2 LN    06/16/2015 Pathologic Stage    ypT0 ypN0    07/23/2015 - 09/05/2015 Radiation Therapy    Adjuvant RT: 50.4 Gy in 28 fractions and a boost of 10 Gy in 5 fractions to total dose of 60.4 Gy    10/24/2015 Survivorship    SCP mailed to patient in lieu of in person visit.    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 identified in BARD 1, BRCA2 and NF1    12/31/2016 Miscellaneous    Everolimus 10 mg daily for cycle 1 if she cannot tolerate will decrease to 7.5 mg daily    05/24/2017 - 07/04/2017 Chemotherapy    Xeloda 2000 mg 2 weeks on 1 week off stopped due to progression of disease based on CT scans done 06/27/2017    07/25/2017 - 03/13/2018 Chemotherapy    [place] days 1 and 8 every 3 weeks stopped for progression     03/20/2018 - 03/31/2018 Radiation Therapy    Radiation to lymph nodes    04/03/2018 -  Chemotherapy    Keytruda every 3 weeks      CHIEF COMPLIANT: Cycle 7 Keytruda  INTERVAL HISTORY: Kaitlyn Keith is a 53 y.o. 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 She presents to the clinic alone today and is doing well. She reports an increase in energy the last few days. Her CBC from today is WNL.   REVIEW OF SYSTEMS:   Constitutional: Denies fevers, chills or abnormal weight loss Eyes: Denies blurriness of vision Ears, nose, mouth, throat, and face: Denies mucositis or sore throat Respiratory: Denies cough, dyspnea or wheezes Cardiovascular: Denies palpitation, chest discomfort Gastrointestinal: Denies nausea, heartburn or change in bowel  habits Skin: Denies abnormal skin rashes Lymphatics: Denies new lymphadenopathy or easy bruising Neurological: Denies numbness, tingling or new weaknesses Behavioral/Psych: Mood is stable, no new changes  Extremities: No lower extremity edema Breast: denies any pain or lumps or nodules in either breasts All other systems were reviewed with the patient and are negative.  I have reviewed the past medical history, past surgical history, social history and family history with the patient and they are unchanged from previous note.  ALLERGIES:  has No Known Allergies.  MEDICATIONS:  Current Outpatient Medications  Medication Sig Dispense Refill  . ALPRAZolam (XANAX) 2 MG tablet Take 1 tablet (2 mg total) by mouth at bedtime. 30 tablet 5  . betamethasone valerate ointment (VALISONE) 0.1 % Apply 1 application topically 2 (two) times daily. 30 g 1  . cyclobenzaprine (FLEXERIL) 5 MG tablet Take 1 tablet (5 mg total) by mouth 3 (three) times daily as needed for muscle spasms. 30 tablet 0  . gabapentin (NEURONTIN) 100 MG capsule TAKE 1 CAPSULE BY MOUTH AT BEDTIME. 90 capsule 3  . levothyroxine (SYNTHROID, LEVOTHROID) 125 MCG tablet Take 1 tablet (125 mcg total) by mouth daily before breakfast. 30 tablet 1  . lidocaine-prilocaine (EMLA) cream APPLY TO AFFECTED AREA ONCE AS DIRECTED 30 g 3  . lisinopril-hydrochlorothiazide (PRINZIDE,ZESTORETIC) 20-12.5 MG tablet Take 1 tablet by mouth 2 (two) times daily. 180 tablet 3   No current facility-administered medications for this visit.     PHYSICAL EXAMINATION: ECOG PERFORMANCE STATUS: 1 - Symptomatic but completely ambulatory  Vitals:   08/03/18 1354  BP: (!) 131/91  Pulse: 65  Resp: 17  Temp: 98.4 F (36.9 C)  SpO2: 100%   Filed Weights   08/03/18 1354  Weight: 181 lb 9.6 oz (82.4 kg)    GENERAL: alert, no distress and comfortable SKIN: skin color, texture, turgor are normal, no rashes or significant lesions EYES: normal, Conjunctiva are  pink and non-injected, sclera clear OROPHARYNX: no exudate, no erythema and lips, buccal mucosa, and tongue normal  NECK: supple, thyroid normal size, non-tender, without nodularity LYMPH: no palpable lymphadenopathy in the cervical, axillary or inguinal LUNGS: clear to auscultation and percussion with normal breathing effort HEART: regular rate & rhythm and no murmurs and no lower extremity edema ABDOMEN: abdomen soft, non-tender and normal bowel sounds MUSCULOSKELETAL: no cyanosis of digits and no clubbing  NEURO: alert & oriented x 3 with fluent speech, no focal motor/sensory deficits EXTREMITIES: No lower extremity edema  LABORATORY DATA:  I have reviewed the data as listed CMP Latest Ref Rng & Units 07/13/2018 06/22/2018 06/01/2018  Glucose 70 - 99 mg/dL 114(H) 107(H) 87  BUN 6 - 20 mg/dL '8 8 11  ' Creatinine 0.44 - 1.00 mg/dL 0.76 0.71 0.84  Sodium 135 - 145 mmol/L 139 140 140  Potassium 3.5 - 5.1 mmol/L 3.4(L) 3.4(L) 3.6  Chloride 98 - 111 mmol/L 105 107 106  CO2 22 - 32 mmol/L 25 27 25  Calcium 8.9 - 10.3 mg/dL 8.8(L) 9.0 8.9  Total Protein 6.5 - 8.1 g/dL 7.2 7.3 7.8  Total Bilirubin 0.3 - 1.2 mg/dL 0.9 0.7 0.5  Alkaline Phos 38 - 126 U/L 83 91 86  AST 15 - 41 U/L '15 16 18  ' ALT 0 - 44 U/L '8 12 12    ' Lab Results  Component Value Date   WBC 4.2 08/03/2018   HGB 13.1 08/03/2018   HCT 40.5 08/03/2018   MCV 87.7 08/03/2018   PLT 189 08/03/2018   NEUTROABS 2.6 08/03/2018    ASSESSMENT & PLAN:  Breast cancer of lower-outer quadrant of right female breast ([place]) Right breast biopsy 12/03/2014 8:00: Invasive ductal carcinoma, grade 3, ER 0%, PR 0%, 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 Neoadj chemo 12/24/14- 04/29/15 AC x 4 foll by Abraxane X 12 Rt Lumpectomy: Path CR 0/2 LN Adj XRT 07/23/15- 09/08/15 PET/CT scan 11/17/2016:Subcutaneous nodules in the neck, upper  back, left arm, abdomen and pelvis Patient progressed on Xeloda January 2019-07/04/2017 stopped due to progression of disease Cerebellar mass diagnosed 07/12/2016: Resection followed by stereotactic radiation 07/29/2016 Lymph node from 03/02/2018 biopsy: PDL1+ ------------------------------------------------------------------------------------------------------------------------------------------------ Current treatment:Pembrolizumab given every 3 weeks starting 04/03/18, today's cycle 6 Keytruda toxicities:Hypothyroidism for which she required thyroid replacement therapy Currently on 112 mcg of Synthroid. Awaiting today's TSH level.  CT CAP 06/12/2018: Interval decrease in the left supraclavicular lymph node and the metastatic lesion in the subcutaneous fat of the right groin region also decreased. No change in the 6 mm lesion in the right liver  Plan: Continue with the current therapy return to clinic every 3 weeks for pembrolizumab. Plan to give 2 more cycles of pembrolizumab before obtaining scans. Monitoring closely for toxicities.    No orders of the defined types were placed in this encounter.  The patient has a good understanding of the overall plan. she agrees with it. she will call with any problems that may develop before the next visit here.  [name], MD 08/03/2018  Julious Oka Dorshimer am acting as scribe for Dr. [name].  I have reviewed the above documentation for accuracy and completeness, and I agree with the above.

## 2018-08-03 ENCOUNTER — Inpatient Hospital Stay: Payer: BLUE CROSS/BLUE SHIELD

## 2018-08-03 ENCOUNTER — Ambulatory Visit: Payer: BLUE CROSS/BLUE SHIELD

## 2018-08-03 ENCOUNTER — Other Ambulatory Visit: Payer: BLUE CROSS/BLUE SHIELD

## 2018-08-03 ENCOUNTER — Inpatient Hospital Stay (HOSPITAL_BASED_OUTPATIENT_CLINIC_OR_DEPARTMENT_OTHER): Payer: BLUE CROSS/BLUE SHIELD | Admitting: Hematology and Oncology

## 2018-08-03 ENCOUNTER — Encounter: Payer: Self-pay | Admitting: *Deleted

## 2018-08-03 ENCOUNTER — Other Ambulatory Visit: Payer: Self-pay

## 2018-08-03 ENCOUNTER — Inpatient Hospital Stay: Payer: BLUE CROSS/BLUE SHIELD | Attending: Hematology and Oncology

## 2018-08-03 ENCOUNTER — Ambulatory Visit: Payer: BLUE CROSS/BLUE SHIELD | Admitting: Hematology and Oncology

## 2018-08-03 DIAGNOSIS — C50511 Malignant neoplasm of lower-outer quadrant of right female breast: Secondary | ICD-10-CM

## 2018-08-03 DIAGNOSIS — C792 Secondary malignant neoplasm of skin: Secondary | ICD-10-CM

## 2018-08-03 DIAGNOSIS — Z171 Estrogen receptor negative status [ER-]: Secondary | ICD-10-CM | POA: Diagnosis not present

## 2018-08-03 DIAGNOSIS — C7931 Secondary malignant neoplasm of brain: Secondary | ICD-10-CM

## 2018-08-03 DIAGNOSIS — Z5112 Encounter for antineoplastic immunotherapy: Secondary | ICD-10-CM | POA: Insufficient documentation

## 2018-08-03 DIAGNOSIS — E039 Hypothyroidism, unspecified: Secondary | ICD-10-CM

## 2018-08-03 LAB — CBC WITH DIFFERENTIAL (CANCER CENTER ONLY)
Abs Immature Granulocytes: 0.01 10*3/uL (ref 0.00–0.07)
Basophils Absolute: 0 10*3/uL (ref 0.0–0.1)
Basophils Relative: 1 %
EOS ABS: 0.1 10*3/uL (ref 0.0–0.5)
Eosinophils Relative: 3 %
HCT: 40.5 % (ref 36.0–46.0)
Hemoglobin: 13.1 g/dL (ref 12.0–15.0)
Immature Granulocytes: 0 %
Lymphocytes Relative: 25 %
Lymphs Abs: 1.1 10*3/uL (ref 0.7–4.0)
MCH: 28.4 pg (ref 26.0–34.0)
MCHC: 32.3 g/dL (ref 30.0–36.0)
MCV: 87.7 fL (ref 80.0–100.0)
Monocytes Absolute: 0.4 10*3/uL (ref 0.1–1.0)
Monocytes Relative: 10 %
Neutro Abs: 2.6 10*3/uL (ref 1.7–7.7)
Neutrophils Relative %: 61 %
Platelet Count: 189 10*3/uL (ref 150–400)
RBC: 4.62 MIL/uL (ref 3.87–5.11)
RDW: 12.9 % (ref 11.5–15.5)
WBC Count: 4.2 10*3/uL (ref 4.0–10.5)
nRBC: 0 % (ref 0.0–0.2)

## 2018-08-03 LAB — CMP (CANCER CENTER ONLY)
ALT: 8 U/L (ref 0–44)
AST: 15 U/L (ref 15–41)
Albumin: 3.6 g/dL (ref 3.5–5.0)
Alkaline Phosphatase: 88 U/L (ref 38–126)
Anion gap: 10 (ref 5–15)
BUN: 11 mg/dL (ref 6–20)
CO2: 25 mmol/L (ref 22–32)
Calcium: 9 mg/dL (ref 8.9–10.3)
Chloride: 105 mmol/L (ref 98–111)
Creatinine: 0.77 mg/dL (ref 0.44–1.00)
GFR, Est AFR Am: >60 mL/min (ref >60)
GFR, Estimated: >60 mL/min (ref >60)
Glucose, Bld: 94 mg/dL (ref 70–99)
Potassium: 3.5 mmol/L (ref 3.5–5.1)
Sodium: 140 mmol/L (ref 135–145)
TOTAL PROTEIN: 7.3 g/dL (ref 6.5–8.1)
Total Bilirubin: 0.6 mg/dL (ref 0.3–1.2)

## 2018-08-03 LAB — TSH: TSH: 0.639 u[IU]/mL (ref 0.308–3.960)

## 2018-08-03 MED ORDER — HEPARIN SOD (PORK) LOCK FLUSH 100 UNIT/ML IV SOLN
500.0000 [IU] | Freq: Once | INTRAVENOUS | Status: AC | PRN
  Administered 2018-08-03: 500 [IU]
  Filled 2018-08-03: qty 5

## 2018-08-03 MED ORDER — SODIUM CHLORIDE 0.9 % IV SOLN
200.0000 mg | Freq: Once | INTRAVENOUS | Status: AC
  Administered 2018-08-03: 200 mg via INTRAVENOUS
  Filled 2018-08-03: qty 8

## 2018-08-03 MED ORDER — SODIUM CHLORIDE 0.9% FLUSH
10.0000 mL | INTRAVENOUS | Status: DC | PRN
  Administered 2018-08-03: 10 mL
  Filled 2018-08-03: qty 10

## 2018-08-03 MED ORDER — SODIUM CHLORIDE 0.9 % IV SOLN
Freq: Once | INTRAVENOUS | Status: AC
  Administered 2018-08-03: 16:00:00 via INTRAVENOUS
  Filled 2018-08-03: qty 250

## 2018-08-03 NOTE — Patient Instructions (Signed)
[facility] Discharge Instructions for Patients Receiving Chemotherapy  Today you received the following chemotherapy agent: Keytruda  To help prevent nausea and vomiting after your treatment, we encourage you to take your nausea medication as prescribed   If you develop nausea and vomiting that is not controlled by your nausea medication, call the clinic.   BELOW ARE SYMPTOMS THAT SHOULD BE REPORTED IMMEDIATELY:  *FEVER GREATER THAN 100.5 F  *CHILLS WITH OR WITHOUT FEVER  NAUSEA AND VOMITING THAT IS NOT CONTROLLED WITH YOUR NAUSEA MEDICATION  *UNUSUAL SHORTNESS OF BREATH  *UNUSUAL BRUISING OR BLEEDING  TENDERNESS IN MOUTH AND THROAT WITH OR WITHOUT PRESENCE OF ULCERS  *URINARY PROBLEMS  *BOWEL PROBLEMS  UNUSUAL RASH Items with * indicate a potential emergency and should be followed up as soon as possible.  Feel free to call the clinic should you have any questions or concerns. The clinic phone number is [phone].  Please show the CHEMO ALERT CARD at check-in to the Emergency Department and triage nurse.   

## 2018-08-03 NOTE — Assessment & Plan Note (Signed)
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 Neoadj chemo 12/24/14- 04/29/15 AC x 4 foll by Abraxane X 12 Rt Lumpectomy: Path CR 0/2 LN Adj XRT 07/23/15- 09/08/15 PET/CT scan 11/17/2016:Subcutaneous nodules in the neck, upper back, left arm, abdomen and pelvis Patient progressed on Xeloda January 2019-07/04/2017 stopped due to progression of disease Cerebellar mass diagnosed 07/12/2016: Resection followed by stereotactic radiation 07/29/2016 Lymph node from 03/02/2018 biopsy: PDL1+ ------------------------------------------------------------------------------------------------------------------------------------------------ Current treatment:Pembrolizumab given every 3 weeks starting 04/03/18, today's cycle 6 Keytruda toxicities:Hypothyroidism for which she required thyroid replacement therapy Currently on 112 mcg of Synthroid. Awaiting today's TSH level.  CT CAP 06/12/2018: Interval decrease in the left supraclavicular lymph node and the metastatic lesion in the subcutaneous fat of the right groin region also decreased. No change in the 6 mm lesion in the right liver  Plan: Continue with the current therapy return to clinic every 3 weeks for pembrolizumab. Plan to give 2 more cycles of pembrolizumab before obtaining scans. Monitoring closely for toxicities.

## 2018-08-11 ENCOUNTER — Other Ambulatory Visit: Payer: Self-pay | Admitting: Hematology and Oncology

## 2018-08-21 ENCOUNTER — Other Ambulatory Visit: Payer: Self-pay | Admitting: Radiation Therapy

## 2018-08-21 ENCOUNTER — Encounter: Payer: Self-pay | Admitting: General Practice

## 2018-08-21 DIAGNOSIS — C7931 Secondary malignant neoplasm of brain: Secondary | ICD-10-CM

## 2018-08-21 DIAGNOSIS — C7949 Secondary malignant neoplasm of other parts of nervous system: Principal | ICD-10-CM

## 2018-08-21 NOTE — Progress Notes (Signed)
[place] Team attempted to phone patient to assess for food insecurity and other psychosocial needs during current [place] pandemic. VM is full, so unable to leave message. Team may continue to try to reach pt by phone.     [place], [place], [facility] Pager [phone] Voicemail [phone]

## 2018-08-22 ENCOUNTER — Telehealth: Payer: Self-pay | Admitting: General Practice

## 2018-08-22 ENCOUNTER — Encounter: Payer: Self-pay | Admitting: General Practice

## 2018-08-22 NOTE — Progress Notes (Signed)
[place] Team contacted patient to assess for food insecurity and other psychosocial needs during current COVID19 pandemic.    Per pt, food and basic needs are met.  Support Team member encouraged patient to call if changes occur or they have any other questions/concerns.    [place], [place], [facility] Pager [phone] Voicemail [phone]

## 2018-08-22 NOTE — Telephone Encounter (Signed)
[place] CSW Progress Notes  COVID food insecurity call completed - patient reports she is "doing well", no needs.  Edwyna Shell, LCSW Clinical Social Worker Phone:  [phone]

## 2018-08-23 NOTE — Progress Notes (Signed)
Patient Care Team: [name], MD as PCP - General (Hematology and Oncology) [name], [name], [name], [name], [name], [name], NP (Inactive) as Nurse Practitioner (Nurse Practitioner)  DIAGNOSIS:    ICD-10-CM   1. 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%, HER-2 negative ratio 1.43    12/10/2014 Breast MRI    Right breast lower outer quadrant: 2.3 x 2.4 x 2.4 cm rim-enhancing mass abuts the pectoralis fascia but no enhancement of pectoralis muscle, second focus of artifact?'s second tissue marker clip, no lymph nodes    12/10/2014 Clinical Stage    Stage IIA: T2 N0    12/24/2014 - 04/29/2015 Neo-Adjuvant Chemotherapy    Dose dense Adriamycin and Cytoxan 4 followed by weekly Abraxane 12    05/02/2015 Breast MRI    complete radiologic response    06/16/2015 Surgery    Left Lumpectomy: Complete path Response, 0/2 LN    06/16/2015 Pathologic Stage    ypT0 ypN0    07/23/2015 - 09/05/2015 Radiation Therapy    Adjuvant RT: 50.4 Gy in 28 fractions and a boost of 10 Gy in 5 fractions to total dose of 60.4 Gy    10/24/2015 Survivorship    SCP mailed to patient in lieu of in person visit.    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 identified in BARD 1, BRCA2 and NF1    12/31/2016 Miscellaneous    Everolimus 10 mg daily for cycle 1 if she cannot tolerate will decrease to 7.5 mg daily    05/24/2017 - 07/04/2017 Chemotherapy    Xeloda 2000 mg 2 weeks on 1 week off stopped due to progression of disease based on CT scans done 06/27/2017    07/25/2017 - 03/13/2018 Chemotherapy    [place] days 1 and 8 every 3 weeks stopped for progression     03/20/2018 - 03/31/2018 Radiation Therapy    Radiation to lymph nodes    04/03/2018 -  Chemotherapy    Keytruda every 3 weeks      CHIEF COMPLIANT: Cycle 8 Keytruda  INTERVAL HISTORY: Kaitlyn Keith is a 53 y.o. 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 She presents to the clinic alone today for Cycle 8.  She is tolerating the treatment extremely well.  She does not have any adverse effects since we adjusted the dosage of the thyroid medication.  She feels back to her normal self.  She pulled a muscle in the left side of the back and is seeing a chiropractor and is much better.  REVIEW OF SYSTEMS:   Constitutional: Denies fevers, chills or abnormal weight loss Eyes: Denies blurriness of vision Ears, nose, mouth, throat, and face:  Denies mucositis or sore throat Respiratory: Denies cough, dyspnea or wheezes Cardiovascular: Denies palpitation, chest discomfort Gastrointestinal: Denies nausea, heartburn or change in bowel habits Skin: Denies abnormal skin rashes Lymphatics: Denies new lymphadenopathy or easy bruising Neurological: Denies numbness, tingling or new weaknesses Behavioral/Psych: Mood is stable, no new changes  Extremities: No lower extremity edema Breast: denies any pain or lumps or nodules in either breasts All other systems were reviewed with the patient and are negative.  I have reviewed the past medical history, past surgical history, social history and family history with the patient and they are unchanged from previous note.  ALLERGIES:  has No Known Allergies.  MEDICATIONS:  Current Outpatient Medications  Medication Sig Dispense Refill   ALPRAZolam (XANAX) 2 MG tablet Take 1 tablet (2 mg total) by mouth at bedtime. 30 tablet 5   betamethasone valerate ointment (VALISONE) 0.1 % Apply 1 application topically 2 (two) times daily. 30 g 1   cyclobenzaprine (FLEXERIL) 5 MG tablet Take 1 tablet (5 mg total) by mouth 3 (three) times daily as needed for muscle spasms. 30 tablet 0   gabapentin (NEURONTIN) 100 MG capsule TAKE 1 CAPSULE BY MOUTH AT BEDTIME. 90 capsule 3   levothyroxine (SYNTHROID, LEVOTHROID) 125 MCG tablet TAKE 1 TABLET (125 MCG TOTAL) BY MOUTH DAILY BEFORE BREAKFAST. 90 tablet 3   lidocaine-prilocaine (EMLA) cream APPLY TO AFFECTED AREA ONCE AS DIRECTED 30 g 3   lisinopril-hydrochlorothiazide (PRINZIDE,ZESTORETIC) 20-12.5 MG tablet Take 1 tablet by mouth 2 (two) times daily. 180 tablet 3   No current facility-administered medications for this visit.     PHYSICAL EXAMINATION: ECOG PERFORMANCE STATUS: 1 - Symptomatic but completely ambulatory  Vitals:   08/24/18 0940  BP: (!) 127/99  Pulse: 60  Resp: 18  Temp: 97.7 F (36.5 C)  SpO2: 100%   Filed Weights   08/24/18 0940   Weight: 180 lb 8 oz (81.9 kg)    GENERAL: alert, no distress and comfortable SKIN: skin color, texture, turgor are normal, no rashes or significant lesions EYES: normal, Conjunctiva are pink and non-injected, sclera clear OROPHARYNX: no exudate, no erythema and lips, buccal mucosa, and tongue normal  NECK: supple, thyroid normal size, non-tender, without nodularity LYMPH: no palpable lymphadenopathy in the cervical, axillary or inguinal LUNGS: clear to auscultation and percussion with normal breathing effort HEART: regular rate & rhythm and no murmurs and no lower extremity edema ABDOMEN: abdomen soft, non-tender and normal bowel sounds MUSCULOSKELETAL: no cyanosis of digits and no clubbing  NEURO: alert & oriented x 3 with fluent speech, no focal motor/sensory deficits EXTREMITIES: No lower extremity edema  LABORATORY DATA:  I have reviewed the data as listed CMP Latest Ref Rng & Units 08/24/2018 08/03/2018 07/13/2018  Glucose 70 - 99 mg/dL 88 94 114(H)  BUN 6 - 20 mg/dL _0 Creatinine 0.44 - 1.00 mg/dL 0.77 0.77 0.76  Sodium 135 - 145 mmol/L 139 140 139  Potassium 3.5 - 5.1 mmol/L 3.9 3.5 3.4(L)  Chloride 98 - 111 mmol/L 106 105 105  CO2 22 - 32 mmol/L _0 Calcium 8.9 - 10.3 mg/dL 8.9 9.0 8.8(L)  Total Protein 6.5 - 8.1 g/dL 7.4 7.3 7.2  Total Bilirubin 0.3 - 1.2 mg/dL 0.7 0.6 0.9  Alkaline Phos 38 - 126 U/L 85 88 83  AST 15 - 41 U/L _1 ALT 0 - 44 U/L _2 Lab Results  Component Value Date   WBC 3.4 (L) 08/24/2018   HGB 13.3 08/24/2018   HCT 41.1 08/24/2018   MCV 89.7 08/24/2018   PLT 176 08/24/2018   NEUTROABS 1.8 08/24/2018    ASSESSMENT & PLAN:  Breast cancer of lower-outer quadrant of right female breast ([place]) Right breast biopsy 12/03/2014 8:00: Invasive ductal carcinoma, grade 3, ER 0%, PR 0%, 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 Neoadj chemo 12/24/14- 04/29/15 AC x 4 foll by Abraxane X 12 Rt Lumpectomy: Path CR 0/2 LN Adj XRT 07/23/15- 09/08/15 PET/CT scan 11/17/2016:Subcutaneous nodules in the neck, upper back, left arm, abdomen and pelvis Patient progressed on Xeloda January 2019-07/04/2017 stopped due to progression of disease Cerebellar mass diagnosed 07/12/2016: Resection followed by stereotactic radiation 07/29/2016 Lymph node from 03/02/2018 biopsy: PDL1+ ------------------------------------------------------------------------------------------------------------------------------------------------ Current treatment:Pembrolizumab given every 3 weeks starting 04/03/18, today's cycle 7 Keytruda toxicities:Hypothyroidism for which she required thyroid replacement therapy Currently on 112 mcg of Synthroid. TSH level: 0.639 on 08/03/2018  CT CAP 06/12/2018: Interval decrease in the left supraclavicular lymph node and the metastatic lesion in the subcutaneous fat of the right groin region also decreased. No change in the 6 mm lesion in the right liver  Plan: Continue with the current therapy return to clinic every 3 weeks for pembrolizumab. Plan to obtain scans in June. We discussed extensively the coronavirus precautions that she needs to take.  I recommended that she wear a mask in public places. Monitoring closely for toxicities.  No orders of the defined types were placed in this encounter.  The patient has a good understanding of the overall plan. she agrees with it. she will call with any problems that may develop before the next visit here.  [name], MD 08/24/2018  Julious Oka Dorshimer am acting as scribe for Dr. [name].  I have reviewed the above documentation for accuracy and completeness, and I agree with the above.

## 2018-08-24 ENCOUNTER — Inpatient Hospital Stay: Payer: BLUE CROSS/BLUE SHIELD

## 2018-08-24 ENCOUNTER — Other Ambulatory Visit: Payer: Self-pay

## 2018-08-24 ENCOUNTER — Inpatient Hospital Stay: Payer: BLUE CROSS/BLUE SHIELD | Attending: Hematology and Oncology

## 2018-08-24 ENCOUNTER — Inpatient Hospital Stay (HOSPITAL_BASED_OUTPATIENT_CLINIC_OR_DEPARTMENT_OTHER): Payer: BLUE CROSS/BLUE SHIELD | Admitting: Hematology and Oncology

## 2018-08-24 VITALS — BP 142/96

## 2018-08-24 DIAGNOSIS — C50511 Malignant neoplasm of lower-outer quadrant of right female breast: Secondary | ICD-10-CM | POA: Insufficient documentation

## 2018-08-24 DIAGNOSIS — E039 Hypothyroidism, unspecified: Secondary | ICD-10-CM | POA: Diagnosis not present

## 2018-08-24 DIAGNOSIS — Z171 Estrogen receptor negative status [ER-]: Principal | ICD-10-CM

## 2018-08-24 DIAGNOSIS — C7931 Secondary malignant neoplasm of brain: Secondary | ICD-10-CM

## 2018-08-24 DIAGNOSIS — C792 Secondary malignant neoplasm of skin: Secondary | ICD-10-CM

## 2018-08-24 DIAGNOSIS — Z5112 Encounter for antineoplastic immunotherapy: Secondary | ICD-10-CM | POA: Insufficient documentation

## 2018-08-24 LAB — CMP (CANCER CENTER ONLY)
ALT: 11 U/L (ref 0–44)
AST: 15 U/L (ref 15–41)
Albumin: 3.7 g/dL (ref 3.5–5.0)
Alkaline Phosphatase: 85 U/L (ref 38–126)
Anion gap: 6 (ref 5–15)
BUN: 12 mg/dL (ref 6–20)
CO2: 27 mmol/L (ref 22–32)
Calcium: 8.9 mg/dL (ref 8.9–10.3)
Chloride: 106 mmol/L (ref 98–111)
Creatinine: 0.77 mg/dL (ref 0.44–1.00)
GFR, Est AFR Am: >60 mL/min (ref >60)
GFR, Estimated: >60 mL/min (ref >60)
Glucose, Bld: 88 mg/dL (ref 70–99)
Potassium: 3.9 mmol/L (ref 3.5–5.1)
Sodium: 139 mmol/L (ref 135–145)
Total Bilirubin: 0.7 mg/dL (ref 0.3–1.2)
Total Protein: 7.4 g/dL (ref 6.5–8.1)

## 2018-08-24 LAB — CBC WITH DIFFERENTIAL (CANCER CENTER ONLY)
Abs Immature Granulocytes: 0 10*3/uL (ref 0.00–0.07)
Basophils Absolute: 0 10*3/uL (ref 0.0–0.1)
Basophils Relative: 1 %
Eosinophils Absolute: 0.1 10*3/uL (ref 0.0–0.5)
Eosinophils Relative: 3 %
HCT: 41.1 % (ref 36.0–46.0)
Hemoglobin: 13.3 g/dL (ref 12.0–15.0)
Immature Granulocytes: 0 %
Lymphocytes Relative: 34 %
Lymphs Abs: 1.2 10*3/uL (ref 0.7–4.0)
MCH: 29 pg (ref 26.0–34.0)
MCHC: 32.4 g/dL (ref 30.0–36.0)
MCV: 89.7 fL (ref 80.0–100.0)
Monocytes Absolute: 0.3 10*3/uL (ref 0.1–1.0)
Monocytes Relative: 10 %
Neutro Abs: 1.8 10*3/uL (ref 1.7–7.7)
Neutrophils Relative %: 52 %
Platelet Count: 176 10*3/uL (ref 150–400)
RBC: 4.58 MIL/uL (ref 3.87–5.11)
RDW: 12.9 % (ref 11.5–15.5)
WBC Count: 3.4 10*3/uL — ABNORMAL LOW (ref 4.0–10.5)
nRBC: 0 % (ref 0.0–0.2)

## 2018-08-24 LAB — TSH: TSH: 1.055 u[IU]/mL (ref 0.308–3.960)

## 2018-08-24 MED ORDER — SODIUM CHLORIDE 0.9% FLUSH
10.0000 mL | INTRAVENOUS | Status: DC | PRN
  Administered 2018-08-24: 12:00:00 10 mL
  Filled 2018-08-24: qty 10

## 2018-08-24 MED ORDER — HEPARIN SOD (PORK) LOCK FLUSH 100 UNIT/ML IV SOLN
500.0000 [IU] | Freq: Once | INTRAVENOUS | Status: AC | PRN
  Administered 2018-08-24: 500 [IU]
  Filled 2018-08-24: qty 5

## 2018-08-24 MED ORDER — SODIUM CHLORIDE 0.9 % IV SOLN
Freq: Once | INTRAVENOUS | Status: AC
  Administered 2018-08-24: 11:00:00 via INTRAVENOUS
  Filled 2018-08-24: qty 250

## 2018-08-24 MED ORDER — SODIUM CHLORIDE 0.9% FLUSH
10.0000 mL | INTRAVENOUS | Status: DC | PRN
  Administered 2018-08-24: 10 mL
  Filled 2018-08-24: qty 10

## 2018-08-24 MED ORDER — SODIUM CHLORIDE 0.9 % IV SOLN
200.0000 mg | Freq: Once | INTRAVENOUS | Status: AC
  Administered 2018-08-24: 11:00:00 200 mg via INTRAVENOUS
  Filled 2018-08-24: qty 8

## 2018-08-24 NOTE — Assessment & Plan Note (Signed)
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 Neoadj chemo 12/24/14- 04/29/15 AC x 4 foll by Abraxane X 12 Rt Lumpectomy: Path CR 0/2 LN Adj XRT 07/23/15- 09/08/15 PET/CT scan 11/17/2016:Subcutaneous nodules in the neck, upper back, left arm, abdomen and pelvis Patient progressed on Xeloda January 2019-07/04/2017 stopped due to progression of disease Cerebellar mass diagnosed 07/12/2016: Resection followed by stereotactic radiation 07/29/2016 Lymph node from 03/02/2018 biopsy: PDL1+ ------------------------------------------------------------------------------------------------------------------------------------------------ Current treatment:Pembrolizumab given every 3 weeks starting 04/03/18, today's cycle 7 Keytruda toxicities:Hypothyroidism for which she required thyroid replacement therapy Currently on 112 mcg of Synthroid. TSH level: 0.639 on 08/03/2018  CT CAP 06/12/2018: Interval decrease in the left supraclavicular lymph node and the metastatic lesion in the subcutaneous fat of the right groin region also decreased. No change in the 6 mm lesion in the right liver  Plan: Continue with the current therapy return to clinic every 3 weeks for pembrolizumab. Plan to give  9 cycles of pembrolizumab before obtaining scans. Monitoring closely for toxicities.

## 2018-08-24 NOTE — Progress Notes (Unsigned)
Okay to treat with Keytruda with blood pressure today per Dr. Lindi Adie.

## 2018-08-24 NOTE — Patient Instructions (Signed)
[facility] Discharge Instructions for Patients Receiving Chemotherapy  Today you received the following chemotherapy agent: Keytruda  To help prevent nausea and vomiting after your treatment, we encourage you to take your nausea medication as prescribed   If you develop nausea and vomiting that is not controlled by your nausea medication, call the clinic.   BELOW ARE SYMPTOMS THAT SHOULD BE REPORTED IMMEDIATELY:  *FEVER GREATER THAN 100.5 F  *CHILLS WITH OR WITHOUT FEVER  NAUSEA AND VOMITING THAT IS NOT CONTROLLED WITH YOUR NAUSEA MEDICATION  *UNUSUAL SHORTNESS OF BREATH  *UNUSUAL BRUISING OR BLEEDING  TENDERNESS IN MOUTH AND THROAT WITH OR WITHOUT PRESENCE OF ULCERS  *URINARY PROBLEMS  *BOWEL PROBLEMS  UNUSUAL RASH Items with * indicate a potential emergency and should be followed up as soon as possible.  Feel free to call the clinic should you have any questions or concerns. The clinic phone number is [phone].  Please show the CHEMO ALERT CARD at check-in to the Emergency Department and triage nurse.   

## 2018-09-11 NOTE — Assessment & Plan Note (Signed)
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 Neoadj chemo 12/24/14- 04/29/15 AC x 4 foll by Abraxane X 12 Rt Lumpectomy: Path CR 0/2 LN Adj XRT 07/23/15- 09/08/15 PET/CT scan 11/17/2016:Subcutaneous nodules in the neck, upper back, left arm, abdomen and pelvis Patient progressed on Xeloda January 2019-07/04/2017 stopped due to progression of disease Cerebellar mass diagnosed 07/12/2016: Resection followed by stereotactic radiation 07/29/2016 Lymph node from 03/02/2018 biopsy: PDL1+ ------------------------------------------------------------------------------------------------------------------------------------------------ Current treatment:Pembrolizumab given every 3 weeks starting 04/03/18, today's cycle 8 Keytruda toxicities:Hypothyroidism for which she required thyroid replacement therapy Currently on 112 mcg of Synthroid. TSH level: 0.639 on 08/03/2018  CT CAP 06/12/2018: Interval decrease in the left supraclavicular lymph node and the metastatic lesion in the subcutaneous fat of the right groin region also decreased. No change in the 6 mm lesion in the right liver  Plan: Continue with the current therapy return to clinic every 3 weeks for pembrolizumab. Plan to obtain scans in June.  Monitoring closely for toxicities.

## 2018-09-13 ENCOUNTER — Telehealth: Payer: Self-pay | Admitting: Radiation Oncology

## 2018-09-13 NOTE — Progress Notes (Signed)
Patient Care Team: [name], MD as PCP - General (Hematology and Oncology) [name], [name], [name], [name], [name], [name], NP (Inactive) as Nurse Practitioner (Nurse Practitioner)  DIAGNOSIS:    ICD-10-CM   1. 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, estrogen receptor negative ([place]) C50.511    Z17.1     SUMMARY OF ONCOLOGIC HISTORY:   Breast cancer of lower-outer quadrant of right female breast ([place])   12/03/2014 Mammogram    Right breast mass 1.9 cm it o'clock position 8 cm depth from the nipple    12/03/2014 Initial Diagnosis    Right breast biopsy 8:00: Invasive ductal carcinoma, grade 3, ER 0%, PR 0%, Ki-67 90%, HER-2 negative ratio 1.43    12/10/2014 Breast MRI    Right breast lower outer quadrant: 2.3 x 2.4 x 2.4 cm rim-enhancing mass abuts the pectoralis fascia but no enhancement of pectoralis muscle, second focus of artifact?'s second tissue marker clip, no lymph nodes    12/10/2014 Clinical Stage    Stage IIA: T2 N0    12/24/2014 - 04/29/2015 Neo-Adjuvant Chemotherapy    Dose dense Adriamycin and Cytoxan 4 followed by weekly Abraxane 12    05/02/2015 Breast MRI    complete radiologic response    06/16/2015 Surgery    Left Lumpectomy: Complete path Response, 0/2 LN    06/16/2015 Pathologic Stage    ypT0 ypN0    07/23/2015 - 09/05/2015 Radiation Therapy    Adjuvant RT: 50.4 Gy in 28 fractions and a boost of 10 Gy in 5 fractions to total dose of 60.4 Gy    10/24/2015 Survivorship    SCP mailed to patient in lieu of in person visit.    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 identified in BARD 1, BRCA2 and NF1    12/31/2016 Miscellaneous    Everolimus 10 mg daily for cycle 1 if she cannot tolerate will decrease to 7.5 mg daily    05/24/2017 - 07/04/2017 Chemotherapy    Xeloda 2000 mg 2 weeks on 1 week off stopped due to progression of disease based on CT scans done 06/27/2017    07/25/2017 - 03/13/2018 Chemotherapy    Halaven days 1 and 8 every 3 weeks stopped for progression     03/20/2018 - 03/31/2018 Radiation Therapy    Radiation to lymph nodes    04/03/2018 -  Chemotherapy    Keytruda every 3 weeks      CHIEF COMPLIANT: Cycle 9 Keytruda  INTERVAL HISTORY: Kaitlyn Keith is a 53 y.o. 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  Other than hypothyroidism, she is tolerating the treatment extremely well.    REVIEW OF SYSTEMS:   Constitutional: Denies fevers, chills or abnormal weight loss Eyes: Denies blurriness of vision Ears, nose, mouth, throat, and face: Denies mucositis or sore throat Respiratory: Denies cough, dyspnea or wheezes Cardiovascular: Denies palpitation, chest discomfort Gastrointestinal: Denies nausea, heartburn or change in bowel habits  Skin: Denies abnormal skin rashes Lymphatics: Denies new lymphadenopathy or easy bruising Neurological: Denies numbness, tingling or new weaknesses Behavioral/Psych: Mood is stable, no new changes  Extremities: No lower extremity edema Breast: denies any pain or lumps or nodules in either breasts All other systems were reviewed with the patient and are negative.  I have reviewed the past medical history, past surgical history, social history and family history with the patient and they are unchanged from previous note.  ALLERGIES:  has No Known Allergies.  MEDICATIONS:  Current Outpatient Medications  Medication Sig Dispense Refill  . ALPRAZolam (XANAX) 2 MG tablet Take 1 tablet (2 mg total) by mouth at bedtime. 30 tablet 5  . betamethasone valerate ointment (VALISONE) 0.1 % Apply 1 application topically 2 (two) times daily. 30 g 1  . cyclobenzaprine (FLEXERIL) 5 MG tablet Take 1 tablet (5 mg total) by mouth 3 (three) times daily as needed for muscle spasms. 30 tablet 0  . gabapentin (NEURONTIN) 100 MG capsule TAKE 1 CAPSULE BY MOUTH AT BEDTIME. 90 capsule 3  . levothyroxine (SYNTHROID, LEVOTHROID) 125 MCG tablet TAKE 1 TABLET (125 MCG TOTAL) BY MOUTH DAILY BEFORE BREAKFAST. 90 tablet 3  . lidocaine-prilocaine (EMLA) cream APPLY TO AFFECTED AREA ONCE AS DIRECTED 30 g 3  . lisinopril-hydrochlorothiazide (PRINZIDE,ZESTORETIC) 20-12.5 MG tablet Take 1 tablet by mouth 2 (two) times daily. 180 tablet 3   No current facility-administered medications for this visit.     PHYSICAL EXAMINATION: ECOG PERFORMANCE STATUS: 1 - Symptomatic but completely ambulatory  Vitals:   09/14/18 1004  BP: (!) 146/106  Pulse: (!) 59  Resp: 17  Temp: 98 F (36.7 C)  SpO2: 96%   Filed Weights   09/14/18 1004  Weight: 178 lb 14.4 oz (81.1 kg)    GENERAL: alert, no distress and comfortable SKIN: skin color, texture, turgor are normal, no rashes or significant lesions EYES: normal, Conjunctiva are pink  and non-injected, sclera clear OROPHARYNX: no exudate, no erythema and lips, buccal mucosa, and tongue normal  NECK: supple, thyroid normal size, non-tender, without nodularity LYMPH: no palpable lymphadenopathy in the cervical, axillary or inguinal LUNGS: clear to auscultation and percussion with normal breathing effort HEART: regular rate & rhythm and no murmurs and no lower extremity edema ABDOMEN: abdomen soft, non-tender and normal bowel sounds MUSCULOSKELETAL: no cyanosis of digits and no clubbing  NEURO: alert & oriented x 3 with fluent speech, no focal motor/sensory deficits EXTREMITIES: No lower extremity edema  LABORATORY DATA:  I have reviewed the data as listed CMP Latest Ref Rng & Units 08/24/2018 08/03/2018 07/13/2018  Glucose 70 - 99 mg/dL 88 94 114(H)  BUN 6 - 20 mg/dL _0 Creatinine 0.44 - 1.00 mg/dL 0.77 0.77 0.76  Sodium 135 - 145 mmol/L 139 140 139  Potassium 3.5 - 5.1 mmol/L 3.9 3.5 3.4(L)  Chloride 98 - 111 mmol/L 106 105 105  CO2 22 - 32 mmol/L _1 Calcium 8.9 - 10.3 mg/dL 8.9  9.0 8.8(L)  Total Protein 6.5 - 8.1 g/dL 7.4 7.3 7.2  Total Bilirubin 0.3 - 1.2 mg/dL 0.7 0.6 0.9  Alkaline Phos 38 - 126 U/L 85 88 83  AST 15 - 41 U/L _0 ALT 0 - 44 U/L _1 Lab Results  Component Value Date   WBC 3.5 (L) 09/14/2018   HGB 13.6 09/14/2018   HCT 41.4 09/14/2018   MCV 87.7 09/14/2018   PLT 162 09/14/2018   NEUTROABS 2.0 09/14/2018    ASSESSMENT & PLAN:  Breast cancer of lower-outer quadrant of right female breast ([place]) Right breast biopsy 12/03/2014 8:00: Invasive ductal carcinoma, grade 3, ER 0%, PR 0%, 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 Neoadj chemo 12/24/14- 04/29/15 AC x 4 foll by Abraxane X 12 Rt Lumpectomy: Path CR 0/2 LN Adj XRT 07/23/15- 09/08/15 PET/CT scan 11/17/2016:Subcutaneous nodules in the neck, upper back,  left arm, abdomen and pelvis Patient progressed on Xeloda January 2019-07/04/2017 stopped due to progression of disease Cerebellar mass diagnosed 07/12/2016: Resection followed by stereotactic radiation 07/29/2016 Lymph node from 03/02/2018 biopsy: PDL1+ ------------------------------------------------------------------------------------------------------------------------------------------------ Current treatment:Pembrolizumab given every 3 weeks starting 04/03/18, today's cycle 8 Keytruda toxicities:Hypothyroidism for which she required thyroid replacement therapy Currently on 112 mcg of Synthroid. TSH level: 0.639 on 08/03/2018  CT CAP 06/12/2018: Interval decrease in the left supraclavicular lymph node and the metastatic lesion in the subcutaneous fat of the right groin region also decreased. No change in the 6 mm lesion in the right liver  Plan: Continue with the current therapy return to clinic every 3 weeks for pembrolizumab. Plan to obtain scans in June.  Monitoring closely for toxicities.    No orders of the defined types were placed in this encounter.  The patient has a good understanding of the overall plan. she agrees with it. she will call with any problems that may develop before the next visit here.  [name], MD 09/14/2018  Julious Oka Dorshimer am acting as scribe for Dr. [name].  I have reviewed the above documentation for accuracy and completeness, and I agree with the above.

## 2018-09-13 NOTE — Telephone Encounter (Signed)
Received voicemail message from patient requesting return call. Phoned patient back. Patient request that future MRIs be scheduled on Saturday in the morning so she doesn't have to miss work. Patient confirms her intentions to present for the MRI scheduled on May 8th but was hoping we could accommodate her in the future. Explained this RN would inform all staff of this finding and we would be more than willing to work with her in the future. She expressed appreciation for the call back.

## 2018-09-14 ENCOUNTER — Inpatient Hospital Stay: Payer: BLUE CROSS/BLUE SHIELD

## 2018-09-14 ENCOUNTER — Other Ambulatory Visit: Payer: Self-pay

## 2018-09-14 ENCOUNTER — Inpatient Hospital Stay (HOSPITAL_BASED_OUTPATIENT_CLINIC_OR_DEPARTMENT_OTHER): Payer: BLUE CROSS/BLUE SHIELD | Admitting: Hematology and Oncology

## 2018-09-14 VITALS — BP 143/100

## 2018-09-14 DIAGNOSIS — E039 Hypothyroidism, unspecified: Secondary | ICD-10-CM

## 2018-09-14 DIAGNOSIS — C50511 Malignant neoplasm of lower-outer quadrant of right female breast: Secondary | ICD-10-CM | POA: Diagnosis not present

## 2018-09-14 DIAGNOSIS — C7931 Secondary malignant neoplasm of brain: Secondary | ICD-10-CM

## 2018-09-14 DIAGNOSIS — Z171 Estrogen receptor negative status [ER-]: Secondary | ICD-10-CM

## 2018-09-14 DIAGNOSIS — Z5112 Encounter for antineoplastic immunotherapy: Secondary | ICD-10-CM | POA: Diagnosis not present

## 2018-09-14 DIAGNOSIS — C792 Secondary malignant neoplasm of skin: Secondary | ICD-10-CM

## 2018-09-14 LAB — CMP (CANCER CENTER ONLY)
ALT: 10 U/L (ref 0–44)
AST: 17 U/L (ref 15–41)
Albumin: 3.9 g/dL (ref 3.5–5.0)
Alkaline Phosphatase: 89 U/L (ref 38–126)
Anion gap: 10 (ref 5–15)
BUN: 16 mg/dL (ref 6–20)
CO2: 25 mmol/L (ref 22–32)
Calcium: 9.2 mg/dL (ref 8.9–10.3)
Chloride: 105 mmol/L (ref 98–111)
Creatinine: 0.73 mg/dL (ref 0.44–1.00)
GFR, Est AFR Am: >60 mL/min (ref >60)
GFR, Estimated: >60 mL/min (ref >60)
Glucose, Bld: 89 mg/dL (ref 70–99)
Potassium: 3.7 mmol/L (ref 3.5–5.1)
Sodium: 140 mmol/L (ref 135–145)
Total Bilirubin: 0.7 mg/dL (ref 0.3–1.2)
Total Protein: 7.8 g/dL (ref 6.5–8.1)

## 2018-09-14 LAB — CBC WITH DIFFERENTIAL (CANCER CENTER ONLY)
Abs Immature Granulocytes: 0.01 10*3/uL (ref 0.00–0.07)
Basophils Absolute: 0 10*3/uL (ref 0.0–0.1)
Basophils Relative: 1 %
Eosinophils Absolute: 0.1 10*3/uL (ref 0.0–0.5)
Eosinophils Relative: 3 %
HCT: 41.4 % (ref 36.0–46.0)
Hemoglobin: 13.6 g/dL (ref 12.0–15.0)
Immature Granulocytes: 0 %
Lymphocytes Relative: 28 %
Lymphs Abs: 1 10*3/uL (ref 0.7–4.0)
MCH: 28.8 pg (ref 26.0–34.0)
MCHC: 32.9 g/dL (ref 30.0–36.0)
MCV: 87.7 fL (ref 80.0–100.0)
Monocytes Absolute: 0.4 10*3/uL (ref 0.1–1.0)
Monocytes Relative: 10 %
Neutro Abs: 2 10*3/uL (ref 1.7–7.7)
Neutrophils Relative %: 58 %
Platelet Count: 162 10*3/uL (ref 150–400)
RBC: 4.72 MIL/uL (ref 3.87–5.11)
RDW: 12.3 % (ref 11.5–15.5)
WBC Count: 3.5 10*3/uL — ABNORMAL LOW (ref 4.0–10.5)
nRBC: 0 % (ref 0.0–0.2)

## 2018-09-14 LAB — TSH: TSH: 0.11 u[IU]/mL — ABNORMAL LOW (ref 0.308–3.960)

## 2018-09-14 MED ORDER — SODIUM CHLORIDE 0.9 % IV SOLN
200.0000 mg | Freq: Once | INTRAVENOUS | Status: AC
  Administered 2018-09-14: 200 mg via INTRAVENOUS
  Filled 2018-09-14: qty 8

## 2018-09-14 MED ORDER — SODIUM CHLORIDE 0.9% FLUSH
10.0000 mL | INTRAVENOUS | Status: DC | PRN
  Administered 2018-09-14: 10 mL
  Filled 2018-09-14: qty 10

## 2018-09-14 MED ORDER — SODIUM CHLORIDE 0.9 % IV SOLN
Freq: Once | INTRAVENOUS | Status: AC
  Administered 2018-09-14: 11:00:00 via INTRAVENOUS
  Filled 2018-09-14: qty 250

## 2018-09-14 MED ORDER — HEPARIN SOD (PORK) LOCK FLUSH 100 UNIT/ML IV SOLN
500.0000 [IU] | Freq: Once | INTRAVENOUS | Status: AC | PRN
  Administered 2018-09-14: 500 [IU]
  Filled 2018-09-14: qty 5

## 2018-09-14 NOTE — Patient Instructions (Signed)

## 2018-09-14 NOTE — Progress Notes (Signed)
Per Dr. Lindi Adie okay to treat with BP 143/100

## 2018-09-14 NOTE — Patient Instructions (Signed)
[facility] Discharge Instructions for Patients Receiving Chemotherapy  Today you received the following chemotherapy agent: Keytruda  To help prevent nausea and vomiting after your treatment, we encourage you to take your nausea medication as prescribed   If you develop nausea and vomiting that is not controlled by your nausea medication, call the clinic.   BELOW ARE SYMPTOMS THAT SHOULD BE REPORTED IMMEDIATELY:  *FEVER GREATER THAN 100.5 F  *CHILLS WITH OR WITHOUT FEVER  NAUSEA AND VOMITING THAT IS NOT CONTROLLED WITH YOUR NAUSEA MEDICATION  *UNUSUAL SHORTNESS OF BREATH  *UNUSUAL BRUISING OR BLEEDING  TENDERNESS IN MOUTH AND THROAT WITH OR WITHOUT PRESENCE OF ULCERS  *URINARY PROBLEMS  *BOWEL PROBLEMS  UNUSUAL RASH Items with * indicate a potential emergency and should be followed up as soon as possible.  Feel free to call the clinic should you have any questions or concerns. The clinic phone number is [phone].  Please show the CHEMO ALERT CARD at check-in to the Emergency Department and triage nurse.   

## 2018-09-25 ENCOUNTER — Other Ambulatory Visit: Payer: Self-pay | Admitting: Radiation Therapy

## 2018-09-27 ENCOUNTER — Encounter (HOSPITAL_COMMUNITY): Payer: Self-pay

## 2018-09-27 ENCOUNTER — Ambulatory Visit (HOSPITAL_COMMUNITY): Admission: RE | Admit: 2018-09-27 | Payer: BLUE CROSS/BLUE SHIELD | Source: Ambulatory Visit

## 2018-09-28 ENCOUNTER — Ambulatory Visit (HOSPITAL_COMMUNITY): Payer: BLUE CROSS/BLUE SHIELD

## 2018-09-29 ENCOUNTER — Ambulatory Visit (HOSPITAL_COMMUNITY): Payer: BLUE CROSS/BLUE SHIELD

## 2018-10-02 ENCOUNTER — Other Ambulatory Visit: Payer: Self-pay | Admitting: Radiation Therapy

## 2018-10-02 ENCOUNTER — Inpatient Hospital Stay: Payer: BLUE CROSS/BLUE SHIELD

## 2018-10-02 NOTE — Assessment & Plan Note (Signed)
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 Neoadj chemo 12/24/14- 04/29/15 AC x 4 foll by Abraxane X 12 Rt Lumpectomy: Path CR 0/2 LN Adj XRT 07/23/15- 09/08/15 PET/CT scan 11/17/2016:Subcutaneous nodules in the neck, upper back, left arm, abdomen and pelvis Patient progressed on Xeloda January 2019-07/04/2017 stopped due to progression of disease Cerebellar mass diagnosed 07/12/2016: Resection followed by stereotactic radiation 07/29/2016 Lymph node from 03/02/2018 biopsy: PDL1+ ------------------------------------------------------------------------------------------------------------------------------------------------ Current treatment:Pembrolizumab given every 3 weeks starting 04/03/18, today's cycle 9 Keytruda toxicities:Hypothyroidism for which she required thyroid replacement therapy Currently on 112 mcg of Synthroid. TSH level: 0.639 on 08/03/2018  CT CAP 06/12/2018: Interval decrease in the left supraclavicular lymph node and the metastatic lesion in the subcutaneous fat of the right groin region also decreased. No change in the 6 mm lesion in the right liver  Plan: Continue with the current therapy return to clinic every 3 weeks for pembrolizumab. Plan toobtain scans in June.  Monitoring closely for toxicities.

## 2018-10-04 ENCOUNTER — Ambulatory Visit: Payer: Self-pay | Admitting: Urology

## 2018-10-04 NOTE — Progress Notes (Signed)
Patient Care Team: [name], MD as PCP - General (Hematology and Oncology) [name], [name], [name], [name], [name], [name], NP (Inactive) as Nurse Practitioner (Nurse Practitioner)  DIAGNOSIS:    ICD-10-CM   1. 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, estrogen receptor negative ([place]) C50.511 CT Abdomen Pelvis W Contrast   Z17.1 CT Chest W Contrast    SUMMARY OF ONCOLOGIC HISTORY:   Breast cancer of lower-outer quadrant of right female breast ([place])   12/03/2014 Mammogram    Right breast mass 1.9 cm it o'clock position 8 cm depth from the nipple    12/03/2014 Initial Diagnosis    Right breast biopsy 8:00: Invasive ductal carcinoma, grade 3, ER 0%, PR 0%, Ki-67 90%, HER-2 negative ratio 1.43    12/10/2014 Breast MRI    Right breast lower outer quadrant: 2.3 x 2.4 x 2.4 cm rim-enhancing mass abuts the pectoralis fascia but no enhancement of pectoralis muscle, second focus of artifact?'s second tissue marker clip, no lymph nodes    12/10/2014 Clinical Stage    Stage IIA: T2 N0    12/24/2014 - 04/29/2015 Neo-Adjuvant Chemotherapy    Dose dense Adriamycin and Cytoxan 4 followed by weekly Abraxane 12    05/02/2015 Breast MRI    complete radiologic response    06/16/2015 Surgery    Left Lumpectomy: Complete path Response, 0/2 LN    06/16/2015 Pathologic Stage    ypT0 ypN0    07/23/2015 - 09/05/2015 Radiation Therapy    Adjuvant RT: 50.4 Gy in 28 fractions and a boost of 10 Gy in 5 fractions to total dose of 60.4 Gy    10/24/2015 Survivorship    SCP mailed to patient in lieu of in person visit.    07/26/2016 - 07/27/2016 Radiation Therapy     SRS brain    07/27/2016 - 07/29/2016 Hospital Admission    Cerebellar mass:  Right suboccipital craniotomy for tumor resection with stereotactic navigation: Metastatic poorly differentiated adenocarcinoma with extensive necrosis positive for CK 7, MOC 31, CK 5/6; Neg for Er/PR, GATA-3, GCDFP CDX2, Napsin A and TTF-1    11/17/2016 PET scan    Subcutaneous nodules in the neck, upper back, left arm, abdomen and pelvis,. 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 identified in BARD 1, BRCA2 and NF1    12/31/2016 Miscellaneous    Everolimus 10 mg daily for cycle 1 if she cannot tolerate will decrease to 7.5 mg daily    05/24/2017 - 07/04/2017 Chemotherapy    Xeloda 2000 mg 2 weeks on 1 week off stopped due to progression of disease based on CT scans done 06/27/2017    07/25/2017 - 03/13/2018 Chemotherapy    [place]aven days 1 and 8 every 3 weeks stopped for progression     03/20/2018 - 03/31/2018 Radiation Therapy    Radiation to lymph nodes    04/03/2018 -  Chemotherapy    Keytruda every 3 weeks      CHIEF COMPLIANT: Cycle 10 Keytruda  INTERVAL HISTORY: Kaitlyn Keith is  a 53 y.o. 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  REVIEW OF SYSTEMS:   Constitutional: Denies fevers, chills or abnormal weight loss Eyes: Denies blurriness of vision Ears, nose, mouth, throat, and face: Denies mucositis or sore throat Respiratory: Denies cough, dyspnea or wheezes Cardiovascular: Denies palpitation, chest discomfort Gastrointestinal: Denies nausea, heartburn or change in bowel habits Skin: Denies abnormal skin rashes  Lymphatics: Denies new lymphadenopathy or easy bruising Neurological: Denies numbness, tingling or new weaknesses Behavioral/Psych: Mood is stable, no new changes  Extremities: No lower extremity edema Breast: denies any pain or lumps or nodules in either breasts All other systems were reviewed with the patient and are negative.  I have reviewed the past medical history, past surgical history, social history and family history with the patient and they are unchanged from previous note.  ALLERGIES:  has No Known Allergies.  MEDICATIONS:  Current Outpatient Medications  Medication Sig Dispense Refill  . ALPRAZolam (XANAX) 2 MG tablet Take 1 tablet (2 mg total) by mouth at bedtime. 30 tablet 5  . betamethasone valerate ointment (VALISONE) 0.1 % Apply 1 application topically 2 (two) times daily. 30 g 1  . cyclobenzaprine (FLEXERIL) 5 MG tablet Take 1 tablet (5 mg total) by mouth 3 (three) times daily as needed for muscle spasms. 30 tablet 0  . gabapentin (NEURONTIN) 100 MG capsule TAKE 1 CAPSULE BY MOUTH AT BEDTIME. 90 capsule 3  . levothyroxine (SYNTHROID, LEVOTHROID) 125 MCG tablet TAKE 1 TABLET (125 MCG TOTAL) BY MOUTH DAILY BEFORE BREAKFAST. 90 tablet 3  . lidocaine-prilocaine (EMLA) cream APPLY TO AFFECTED AREA ONCE AS DIRECTED 30 g 3  . lisinopril-hydrochlorothiazide (PRINZIDE,ZESTORETIC) 20-12.5 MG tablet Take 1 tablet by mouth 2 (two) times daily. 180 tablet 3   No current facility-administered medications for this visit.     PHYSICAL EXAMINATION: ECOG PERFORMANCE STATUS: 1 - Symptomatic but completely ambulatory  Vitals:   10/05/18 0903  BP: 121/88  Pulse: 67  Resp: 17  Temp: 97.8 F (36.6 C)  SpO2: 100%   Filed Weights   10/05/18 0903  Weight: 178 lb 11.2 oz (81.1 kg)    GENERAL: alert, no distress and comfortable SKIN: skin color, texture, turgor are normal, no rashes or significant lesions EYES: normal, Conjunctiva are pink and non-injected, sclera clear  OROPHARYNX: no exudate, no erythema and lips, buccal mucosa, and tongue normal  NECK: supple, thyroid normal size, non-tender, without nodularity LYMPH: no palpable lymphadenopathy in the cervical, axillary or inguinal LUNGS: clear to auscultation and percussion with normal breathing effort HEART: regular rate & rhythm and no murmurs and no lower extremity edema ABDOMEN: abdomen soft, non-tender and normal bowel sounds MUSCULOSKELETAL: no cyanosis of digits and no clubbing  NEURO: alert & oriented x 3 with fluent speech, no focal motor/sensory deficits EXTREMITIES: No lower extremity edema  LABORATORY DATA:  I have reviewed the data as listed CMP Latest Ref Rng & Units 09/14/2018 08/24/2018 08/03/2018  Glucose 70 - 99 mg/dL 89 88 94  BUN 6 - 20 mg/dL '16 12 11  ' Creatinine 0.44 - 1.00 mg/dL 0.73 0.77 0.77  Sodium 135 - 145 mmol/L 140 139 140  Potassium 3.5 - 5.1 mmol/L 3.7 3.9 3.5  Chloride 98 - 111 mmol/L 105 106 105  CO2 22 - 32 mmol/L '25 27 25  ' Calcium 8.9 - 10.3 mg/dL 9.2 8.9 9.0  Total Protein 6.5 - 8.1 g/dL  7.8 7.4 7.3  Total Bilirubin 0.3 - 1.2 mg/dL 0.7 0.7 0.6  Alkaline Phos 38 - 126 U/L 89 85 88  AST 15 - 41 U/L '17 15 15  ' ALT 0 - 44 U/L '10 11 8    ' Lab Results  Component Value Date   WBC 3.5 (L) 10/05/2018   HGB 14.0 10/05/2018   HCT 42.6 10/05/2018   MCV 87.8 10/05/2018   PLT 180 10/05/2018   NEUTROABS 2.2 10/05/2018    ASSESSMENT & PLAN:  Breast cancer of lower-outer quadrant of right female breast ([place]) Right breast biopsy 12/03/2014 8:00: Invasive ductal carcinoma, grade 3, ER 0%, PR 0%, 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 Neoadj chemo 12/24/14- 04/29/15 AC x 4 foll by Abraxane X 12 Rt Lumpectomy: Path CR 0/2 LN Adj XRT 07/23/15- 09/08/15 PET/CT scan 11/17/2016:Subcutaneous nodules in the neck, upper back, left arm, abdomen and pelvis Patient  progressed on Xeloda January 2019-07/04/2017 stopped due to progression of disease Cerebellar mass diagnosed 07/12/2016: Resection followed by stereotactic radiation 07/29/2016 Lymph node from 03/02/2018 biopsy: PDL1+ ------------------------------------------------------------------------------------------------------------------------------------------------ Current treatment:Pembrolizumab given every 3 weeks starting 04/03/18, today's cycle 9 Keytruda toxicities:Hypothyroidism for which she required thyroid replacement therapy Currently on 112 mcg of Synthroid. TSH level: 0.639 on 08/03/2018  CT CAP 06/12/2018: Interval decrease in the left supraclavicular lymph node and the metastatic lesion in the subcutaneous fat of the right groin region also decreased. No change in the 6 mm lesion in the right liver  Plan: Continue with the current therapy return to clinic every 3 weeks for pembrolizumab. Plan toobtain scans in June.  I ordered the scans to be done June 26 after 12th cycle of pembrolizumab.  Patient is complaining of more fatigue: We will await the results of TSH from today.  Monitoring closely for toxicities.    Orders Placed This Encounter  Procedures  . CT Abdomen Pelvis W Contrast    Standing Status:   Future    Standing Expiration Date:   10/05/2019    Order Specific Question:   ** REASON FOR EXAM (FREE TEXT)    Answer:   Met breast cancer restaging    Order Specific Question:   If indicated for the ordered procedure, I authorize the administration of contrast media per Radiology protocol    Answer:   Yes    Order Specific Question:   Is patient pregnant?    Answer:   No    Order Specific Question:   Preferred imaging location?    Answer:   [facility]    Order Specific Question:   Is Oral Contrast requested for this exam?    Answer:   Yes, Per Radiology protocol    Order Specific Question:   Radiology Contrast Protocol - do NOT remove file path    Answer:    [email or web address]  . CT Chest W Contrast    Standing Status:   Future    Standing Expiration Date:   10/05/2019    Order Specific Question:   ** REASON FOR EXAM (FREE TEXT)    Answer:   Met Breast cancer restaging    Order Specific Question:   If indicated for the ordered procedure, I authorize the administration of contrast media per Radiology protocol    Answer:   Yes    Order Specific Question:   Is patient pregnant?  Answer:   No    Order Specific Question:   Preferred imaging location?    Answer:   [facility]    Order Specific Question:   Radiology Contrast Protocol - do NOT remove file path    Answer:   [email or web address]   The patient has a good understanding of the overall plan. she agrees with it. she will call with any problems that may develop before the next visit here.  [name], MD 10/05/2018  Julious Oka Dorshimer am acting as scribe for Dr. [name].  I have reviewed the above documentation for accuracy and completeness, and I agree with the above.

## 2018-10-05 ENCOUNTER — Inpatient Hospital Stay: Payer: BLUE CROSS/BLUE SHIELD

## 2018-10-05 ENCOUNTER — Other Ambulatory Visit: Payer: Self-pay

## 2018-10-05 ENCOUNTER — Inpatient Hospital Stay (HOSPITAL_BASED_OUTPATIENT_CLINIC_OR_DEPARTMENT_OTHER): Payer: BLUE CROSS/BLUE SHIELD | Admitting: Hematology and Oncology

## 2018-10-05 ENCOUNTER — Inpatient Hospital Stay: Payer: BLUE CROSS/BLUE SHIELD | Attending: Hematology and Oncology

## 2018-10-05 DIAGNOSIS — C50511 Malignant neoplasm of lower-outer quadrant of right female breast: Secondary | ICD-10-CM | POA: Diagnosis not present

## 2018-10-05 DIAGNOSIS — Z9221 Personal history of antineoplastic chemotherapy: Secondary | ICD-10-CM | POA: Insufficient documentation

## 2018-10-05 DIAGNOSIS — C778 Secondary and unspecified malignant neoplasm of lymph nodes of multiple regions: Secondary | ICD-10-CM | POA: Diagnosis not present

## 2018-10-05 DIAGNOSIS — C7931 Secondary malignant neoplasm of brain: Secondary | ICD-10-CM | POA: Diagnosis not present

## 2018-10-05 DIAGNOSIS — Z923 Personal history of irradiation: Secondary | ICD-10-CM | POA: Diagnosis not present

## 2018-10-05 DIAGNOSIS — Z5112 Encounter for antineoplastic immunotherapy: Secondary | ICD-10-CM | POA: Insufficient documentation

## 2018-10-05 DIAGNOSIS — Z79899 Other long term (current) drug therapy: Secondary | ICD-10-CM | POA: Insufficient documentation

## 2018-10-05 DIAGNOSIS — R5383 Other fatigue: Secondary | ICD-10-CM

## 2018-10-05 DIAGNOSIS — Z171 Estrogen receptor negative status [ER-]: Secondary | ICD-10-CM | POA: Diagnosis not present

## 2018-10-05 DIAGNOSIS — E039 Hypothyroidism, unspecified: Secondary | ICD-10-CM

## 2018-10-05 DIAGNOSIS — C792 Secondary malignant neoplasm of skin: Secondary | ICD-10-CM

## 2018-10-05 LAB — CMP (CANCER CENTER ONLY)
ALT: 12 U/L (ref 0–44)
AST: 15 U/L (ref 15–41)
Albumin: 3.7 g/dL (ref 3.5–5.0)
Alkaline Phosphatase: 97 U/L (ref 38–126)
Anion gap: 8 (ref 5–15)
BUN: 12 mg/dL (ref 6–20)
CO2: 26 mmol/L (ref 22–32)
Calcium: 9.2 mg/dL (ref 8.9–10.3)
Chloride: 104 mmol/L (ref 98–111)
Creatinine: 0.73 mg/dL (ref 0.44–1.00)
GFR, Est AFR Am: >60 mL/min (ref >60)
GFR, Estimated: >60 mL/min (ref >60)
Glucose, Bld: 94 mg/dL (ref 70–99)
Potassium: 3.7 mmol/L (ref 3.5–5.1)
Sodium: 138 mmol/L (ref 135–145)
Total Bilirubin: 0.6 mg/dL (ref 0.3–1.2)
Total Protein: 7.5 g/dL (ref 6.5–8.1)

## 2018-10-05 LAB — CBC WITH DIFFERENTIAL (CANCER CENTER ONLY)
Abs Immature Granulocytes: 0.01 10*3/uL (ref 0.00–0.07)
Basophils Absolute: 0 10*3/uL (ref 0.0–0.1)
Basophils Relative: 1 %
Eosinophils Absolute: 0.1 10*3/uL (ref 0.0–0.5)
Eosinophils Relative: 3 %
HCT: 42.6 % (ref 36.0–46.0)
Hemoglobin: 14 g/dL (ref 12.0–15.0)
Immature Granulocytes: 0 %
Lymphocytes Relative: 24 %
Lymphs Abs: 0.9 10*3/uL (ref 0.7–4.0)
MCH: 28.9 pg (ref 26.0–34.0)
MCHC: 32.9 g/dL (ref 30.0–36.0)
MCV: 87.8 fL (ref 80.0–100.0)
Monocytes Absolute: 0.3 10*3/uL (ref 0.1–1.0)
Monocytes Relative: 8 %
Neutro Abs: 2.2 10*3/uL (ref 1.7–7.7)
Neutrophils Relative %: 64 %
Platelet Count: 180 10*3/uL (ref 150–400)
RBC: 4.85 MIL/uL (ref 3.87–5.11)
RDW: 12.2 % (ref 11.5–15.5)
WBC Count: 3.5 10*3/uL — ABNORMAL LOW (ref 4.0–10.5)
nRBC: 0 % (ref 0.0–0.2)

## 2018-10-05 LAB — TSH: TSH: 0.768 u[IU]/mL (ref 0.308–3.960)

## 2018-10-05 MED ORDER — SODIUM CHLORIDE 0.9 % IV SOLN
200.0000 mg | Freq: Once | INTRAVENOUS | Status: AC
  Administered 2018-10-05: 200 mg via INTRAVENOUS
  Filled 2018-10-05: qty 8

## 2018-10-05 MED ORDER — SODIUM CHLORIDE 0.9% FLUSH
10.0000 mL | INTRAVENOUS | Status: DC | PRN
  Administered 2018-10-05: 10 mL
  Filled 2018-10-05: qty 10

## 2018-10-05 MED ORDER — HEPARIN SOD (PORK) LOCK FLUSH 100 UNIT/ML IV SOLN
500.0000 [IU] | Freq: Once | INTRAVENOUS | Status: AC | PRN
  Administered 2018-10-05: 11:00:00 500 [IU]
  Filled 2018-10-05: qty 5

## 2018-10-05 MED ORDER — SODIUM CHLORIDE 0.9 % IV SOLN
Freq: Once | INTRAVENOUS | Status: AC
  Administered 2018-10-05: 10:00:00 via INTRAVENOUS
  Filled 2018-10-05: qty 250

## 2018-10-05 NOTE — Patient Instructions (Signed)

## 2018-10-05 NOTE — Patient Instructions (Signed)
[facility] Discharge Instructions for Patients Receiving Chemotherapy  Today you received the following chemotherapy agent: Keytruda  To help prevent nausea and vomiting after your treatment, we encourage you to take your nausea medication as prescribed   If you develop nausea and vomiting that is not controlled by your nausea medication, call the clinic.   BELOW ARE SYMPTOMS THAT SHOULD BE REPORTED IMMEDIATELY:  *FEVER GREATER THAN 100.5 F  *CHILLS WITH OR WITHOUT FEVER  NAUSEA AND VOMITING THAT IS NOT CONTROLLED WITH YOUR NAUSEA MEDICATION  *UNUSUAL SHORTNESS OF BREATH  *UNUSUAL BRUISING OR BLEEDING  TENDERNESS IN MOUTH AND THROAT WITH OR WITHOUT PRESENCE OF ULCERS  *URINARY PROBLEMS  *BOWEL PROBLEMS  UNUSUAL RASH Items with * indicate a potential emergency and should be followed up as soon as possible.  Feel free to call the clinic should you have any questions or concerns. The clinic phone number is [phone].  Please show the CHEMO ALERT CARD at check-in to the Emergency Department and triage nurse.   

## 2018-10-07 ENCOUNTER — Ambulatory Visit (HOSPITAL_COMMUNITY)
Admission: RE | Admit: 2018-10-07 | Discharge: 2018-10-07 | Disposition: A | Discharge status: Home / Self Care | Payer: BLUE CROSS/BLUE SHIELD | Source: Ambulatory Visit | Attending: Radiation Oncology | Admitting: Radiation Oncology

## 2018-10-07 ENCOUNTER — Other Ambulatory Visit: Payer: Self-pay

## 2018-10-07 DIAGNOSIS — C7931 Secondary malignant neoplasm of brain: Secondary | ICD-10-CM

## 2018-10-07 DIAGNOSIS — C7949 Secondary malignant neoplasm of other parts of nervous system: Secondary | ICD-10-CM | POA: Diagnosis present

## 2018-10-07 MED ORDER — GADOBUTROL 1 MMOL/ML IV SOLN
8.0000 mL | Freq: Once | INTRAVENOUS | Status: AC | PRN
  Administered 2018-10-07: 8 mL via INTRAVENOUS

## 2018-10-09 ENCOUNTER — Inpatient Hospital Stay: Payer: BLUE CROSS/BLUE SHIELD

## 2018-10-11 ENCOUNTER — Ambulatory Visit
Admission: RE | Admit: 2018-10-11 | Discharge: 2018-10-11 | Disposition: A | Discharge status: Home / Self Care | Payer: BLUE CROSS/BLUE SHIELD | Source: Ambulatory Visit | Attending: Urology | Admitting: Urology

## 2018-10-11 ENCOUNTER — Other Ambulatory Visit: Payer: Self-pay

## 2018-10-11 ENCOUNTER — Other Ambulatory Visit: Payer: Self-pay | Admitting: Radiation Therapy

## 2018-10-11 DIAGNOSIS — C792 Secondary malignant neoplasm of skin: Secondary | ICD-10-CM

## 2018-10-11 DIAGNOSIS — C7931 Secondary malignant neoplasm of brain: Secondary | ICD-10-CM

## 2018-10-11 DIAGNOSIS — C7949 Secondary malignant neoplasm of other parts of nervous system: Secondary | ICD-10-CM

## 2018-10-11 NOTE — Progress Notes (Addendum)
Radiation Oncology         [phone] ________________________________  Name: Kaitlyn Keith MRN: [id number]  Date: 10/11/2018  DOB: 1966/01/20  Follow-Up Visit Note  CC: [name], MD  Ditty, Kevan Ny, *  Diagnosis:    53 y.o. 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        ICD-10-CM   1. 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 Pelvis / 30 Gy in 10 fractions (right inguinal nodes) 2. Left Supraclavicular / 30 Gy in 10 fractions 3. Abdomen / 20 Gy in 8 fractions (subcutaneous Met)  07/11/2017 - 07/22/2017 palliative XRT for painful subcutaneous nodules ([place]) 1. Left Flank / 30 Gy in 10 fractions 2. Left Groin / 30 Gy in 10 fractions  07/26/16 Preop SRS Treatment Tammi Klippel): PTV1 Right Cerebellum was treated to 18 Gy in 1 fraction  07/23/15-09/05/15 ([place]): 50.4 Gy to the right breast + 10 Gy boost  Narrative:  I spoke with the patient to conduct her routine scheduled 3 month follow up visit to review recent MRI brain via telephone to spare the patient unnecessary potential exposure in the healthcare setting during the current COVID-19 pandemic.  The patient was notified in advance and gave permission to proceed with this visit format. Kaitlyn Keith is a pleasant 53 y.o. 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 She was diagnosed with her cancer in July 2016, and underwent neoadjuvant chemotherapy followed by right lumpectomy and adjuvant radiation. She was found to have recurrent disease in February 2018 with a right cerebellar mass, and underwent preoperative SRS treatment followed by  surgical resection on 07/29/2016.   She developed multiple subcutaneous nodules in the neck, upper back, left arm, abdomen and pelvis, noted on PET scan from 11/17/16. She was started on Everolimus on 12/31/16 but was discontinued 04/11/17 due to disease progression with interval development of subcutaneous nodules in the ventral lower left pelvic wall and left flank and interval growth of 3 scattered peritoneal metastases. She started Xeloda in 05/2017 but discontinued due to disease progression on re-staging scans from 06/27/17 which showed a mixed response to therapy with some regression of the previously noted intraperitoneal implants but other lesions with significant interval growth including subcutaneous nodules in the left flank, left vulvar region, right inguinal LAN and anterior mediastinal LAN. She elected to move forward with palliative XRT to 2 painful subcutaneous lesions in the left flank and left pubic/vulvar region which was completed in March 2019.  She tolerated radiotherapy very well and had an excellent response with decreased size of both treated lesions.  Her systemic chemotherapy was switched to [facility] but this was discontinued in October 2019 due to evidence of disease progression on follow-up CT C/A/P from 02/02/2018 indicating continued progression of right inguinal lymph node and right inguinal lymphadenopathy.  She underwent a CT-guided biopsy of the right inguinal lymph node on 03/02/2018 with final pathology confirming metastatic, poorly differentiated carcinoma, ER/PR negative and HER-2 negative.  Her systemic therapy was changed to pembrolizumab immunotherapy at that time and she was also referred back to radiation oncology for consideration of palliative radiotherapy to  the painful, progressive right inguinal lymphadenopathy.  At the time of her consult on 03/15/2018, she also mentioned that she had recently developed pain in the left supraclavicular region as well as in the left flank  area.  She elected to proceed with palliative radiotherapy to the 3 sites of painful metastatic disease in the right inguinal, left supraclavicular and left abdomen/flank and this was completed on 03/31/2018.  She tolerated the radiation well and did have significant improvement in her pain.  She has now completed 10 cycles of Keytruda (pembrolizumab) and her most recent CT C/A/P from 06/12/2018 showed decrease in size of the treated left supraclavicular lymph node and nodule in the groin and no change in size of the lesion in the liver.   She has continued in routine follow up with Dr. Lindi Adie, last seen on 10/05/2018 and the recommendation is to continue with her current immunotherapy with [facility] every 3 weeks.  She has also continued being followed in our multidisciplinary brain tumor conference, and her last MRI scan on 10/07/18 was recently reviewed on 10/09/18 and revealed a 3 mm enhancing nodule in the right lateral cerebellum at the site of previous tumor treatment, felt to be consistent with treatment related changes, unlikely to be disease recurrence. There were no other enhancing lesions. Today's visit is to review these findings.  On review of systems, the patient reports that she is doing well overall. She denies any chest pain, shortness of breath, cough, fevers, chills, night sweats, or unintended weight changes. She denies any bowel or bladder disturbances, and denies abdominal pain, nausea or vomiting. She denies new visual or auditory changes, dizziness, imbalance, tremors or seizure activity. She has continued having occasional headaches which respond to tylenol or advil if needed.  She denies associated N/V with headaches and rates the HAs 1-2 out of 10 on the pain scale. She denies any new musculoskeletal or joint aches or pains, new skin lesions or concerns. A complete review of systems is obtained and is otherwise negative.  Past Medical History:  Past Medical History:  Diagnosis Date    Anxiety    Arthritis    Back pain    Brain cancer ([place])    brian met from triple negative breast ca   Breast cancer [facility])    Breast cancer of lower-outer quadrant of right female breast ([place]) 12/05/2014   Depression    FH: chemotherapy 12/2014-04/2015   Genetic testing 12/10/2016   Kaitlyn Keith underwent genetic counseling and testing for hereditary cancer syndromes on 11/18/2016. 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 Mutations in MSH6 are associated with a hereditary cancer syndrome called Lynch syndrome. For more detailed discussion, please see genetic counseling documentation from 12/10/2016.  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 Mutations in MSH6 are associated with a hereditary cancer syndrome called Lynch syndrome. For more detailed discussion, please see genetic counseling documentation from 12/10/2016.  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   Radiation 07/23/15-09/05/15   right breast 50.4 Gy, boost of 10 Gy    Past Surgical History: Past Surgical History:  Procedure  Laterality Date   APPLICATION OF CRANIAL NAVIGATION Right 07/27/2016   Procedure: APPLICATION OF CRANIAL NAVIGATION;  Surgeon: Kevan Ny Ditty, MD;  Location: [place];  Service: Neurosurgery;  Laterality: Right;   BIOPSY OF SKIN SUBCUTANEOUS TISSUE AND/OR MUCOUS MEMBRANE Left 12/24/2016   Procedure: OPEN BIOPSY LESIONS ON LEFT LOWER BACK AND SHOULDER BLADE;  Surgeon: [name], MD;  Location: [place];  Service: General;  Laterality: Left;   BREAST LUMPECTOMY WITH NEEDLE  LOCALIZATION AND AXILLARY SENTINEL LYMPH NODE BX Right 06/16/2015   Procedure: BREAST LUMPECTOMY WITH NEEDLE LOCALIZATION AND AXILLARY SENTINEL LYMPH NODE BX;  Surgeon: [name], MD;  Location: [place];  Service: General;  Laterality: Right;   BREAST REDUCTION SURGERY     CRANIOTOMY Right 07/27/2016   Procedure: Right Suboccipital craniotomy for tumor resection with stereotactic navigation;  Surgeon: Kevan Ny Ditty, MD;  Location: [place];  Service: Neurosurgery;  Laterality: Right;   PORT-A-CATH REMOVAL N/A 06/16/2015   Procedure: REMOVAL PORT-A-CATH;  Surgeon: [name], MD;  Location: [place];  Service: General;  Laterality: N/A;   PORTACATH PLACEMENT N/A 12/23/2014   Procedure: INSERTION PORT-A-CATH;  Surgeon: [name], MD;  Location: [place];  Service: General;  Laterality: N/A;   PORTACATH PLACEMENT Left 07/08/2017   Procedure: INSERTION PORT-A-CATH;  Surgeon: [name], MD;  Location: [place];  Service: General;  Laterality: Left;    Social History:  Social History   Socioeconomic History   Marital status: Divorced    Spouse name: Not on file   Number of children: 1   Years of education: Not on file   Highest education level: Not on file  Occupational History   Not on file  Social Needs   Financial resource strain: Not on file   Food insecurity:    Worry: Not on file    Inability: Not on file   Transportation needs:    Medical: No    Non-medical: No  Tobacco Use   Smoking status: Never Smoker   Smokeless tobacco: Never Used  Substance and Sexual Activity   Alcohol use: Yes    Comment: social   Drug use: No   Sexual activity: Yes  Lifestyle   Physical activity:    Days per week: Not on file    Minutes per session: Not on file   Stress: Not on file  Relationships   Social connections:    Talks on phone: Not on file    Gets together: Not on file    Attends  religious service: Not on file    Active member of club or organization: Not on file    Attends meetings of clubs or organizations: Not on file    Relationship status: Not on file   Intimate partner violence:    Fear of current or ex partner: No    Emotionally abused: No    Physically abused: No    Forced sexual activity: No  Other Topics Concern   Not on file  Social History Narrative   Not on file  The patient is divorced. She has worked for [employer], and is accompanied today by her sister  Family History: Family History  Problem Relation Age of Onset   Aneurysm Mother 54       d.55   Heart attack Father 52       d.62   Endometrial cancer Sister 3   Lung cancer Maternal  Uncle        d.68s   Cancer Maternal Grandmother        unspecified type-possibly stomach d.88s                          ALLERGIES:  has No Known Allergies.  Meds: Current Outpatient Medications  Medication Sig Dispense Refill   ALPRAZolam (XANAX) 2 MG tablet Take 1 tablet (2 mg total) by mouth at bedtime. 30 tablet 5   betamethasone valerate ointment (VALISONE) 0.1 % Apply 1 application topically 2 (two) times daily. 30 g 1   cyclobenzaprine (FLEXERIL) 5 MG tablet Take 1 tablet (5 mg total) by mouth 3 (three) times daily as needed for muscle spasms. 30 tablet 0   gabapentin (NEURONTIN) 100 MG capsule TAKE 1 CAPSULE BY MOUTH AT BEDTIME. 90 capsule 3   levothyroxine (SYNTHROID, LEVOTHROID) 125 MCG tablet TAKE 1 TABLET (125 MCG TOTAL) BY MOUTH DAILY BEFORE BREAKFAST. 90 tablet 3   lidocaine-prilocaine (EMLA) cream APPLY TO AFFECTED AREA ONCE AS DIRECTED 30 g 3   lisinopril-hydrochlorothiazide (PRINZIDE,ZESTORETIC) 20-12.5 MG tablet Take 1 tablet by mouth 2 (two) times daily. 180 tablet 3   No current facility-administered medications for this encounter.     Physical Findings:   vitals were not taken for this visit.    Unable to assess due to telephone visit  format.   Lab Findings: Lab Results  Component Value Date   WBC 3.5 (L) 10/05/2018   WBC 3.0 (L) 03/02/2018   HGB 14.0 10/05/2018   HGB 11.7 04/04/2017   HCT 42.6 10/05/2018   HCT 35.9 04/04/2017   PLT 180 10/05/2018   PLT 207 04/04/2017    Lab Results  Component Value Date   NA 138 10/05/2018   NA 139 04/04/2017   K 3.7 10/05/2018   K 3.7 04/04/2017   CHLORIDE 108 04/04/2017   CO2 26 10/05/2018   CO2 24 04/04/2017   GLUCOSE 94 10/05/2018   GLUCOSE 76 04/04/2017   BUN 12 10/05/2018   BUN 5.8 (L) 04/04/2017   CREATININE 0.73 10/05/2018   CREATININE 0.8 04/04/2017   BILITOT 0.6 10/05/2018   BILITOT 0.43 04/04/2017   ALKPHOS 97 10/05/2018   ALKPHOS 131 04/04/2017   AST 15 10/05/2018   AST 18 04/04/2017   ALT 12 10/05/2018   ALT 12 04/04/2017   PROT 7.5 10/05/2018   PROT 7.0 04/04/2017   ALBUMIN 3.7 10/05/2018   ALBUMIN 3.0 (L) 04/04/2017   CALCIUM 9.2 10/05/2018   CALCIUM 8.6 04/04/2017   ANIONGAP 8 10/05/2018    Radiographic Findings: Mr Jeri Cos DJ Contrast  Result Date: 10/07/2018 CLINICAL DATA:  Metastatic breast cancer. Postop resection and SRS right cerebellar lesion EXAM: MRI HEAD WITHOUT AND WITH CONTRAST TECHNIQUE: Multiplanar, multiecho pulse sequences of the brain and surrounding structures were obtained without and with intravenous contrast. CONTRAST:  8 mL Gadovist IV COMPARISON:  MRI head 06/30/2018, 03/24/2018 FINDINGS: Brain: Postop resection and SRS to right cerebellar lesion. 3 mm enhancing nodule in the area of prior metastatic disease in the right lateral cerebellum has enlarged since the prior MRI when it was approximately 1 mm. Surrounding T2 hyperintensity is approximately the same. No other enhancing lesions in the brain. Ventricle size is normal. No acute infarct. Mild postop chronic blood products in the right lateral cerebellum. Vascular: Normal arterial flow voids Skull and upper cervical spine: Right suboccipital craniotomy Sinuses/Orbits:  Negative Other: None IMPRESSION: 3 mm enhancing nodule  in the right lateral cerebellum at the site of previous tumor treatment. This has shown growth since the prior study and is compatible with local recurrence. No other enhancing lesions. Electronically Signed   By: Franchot Gallo M.D.   On: 10/07/2018 14:26    Impression/Plan: 1. 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  Her most recent MRI brain from 10/07/18 was recently reviewed with the multidisciplinary tumor board on 10/09/18 and revealed a 3 mm enhancing nodule in the right lateral cerebellum at the site of previous tumor treatment, felt to be consistent with treatment related changes, unlikely to be disease recurrence. There were no other enhancing lesions. The recommendation is to continue with surveillance MRI brain scans every 3 months with follow up thereafter to review results and recommendations from the multidisciplinary tumor board.  Her most recent systemic imaging from 06/12/18 indicates a positive response to systemic treatment and recent palliative radiotehrapy.  She met with her medical oncologist, Dr. Lindi Adie in follow-up 10/05/18 and the recommendation is to continue with Keytruda q 3 weeks for systemic disease management as she is tolerating this well. She will have repeat systemic imaging in June, following her 12th cycle of Keytruda. She is comfortable and in agreement with this plan.  I spent 25 minutes in telephone conversation with the patient and more than 50% of that time was spent in counseling and/or coordination of care.    Nicholos Johns, PA-C

## 2018-10-12 ENCOUNTER — Encounter: Payer: Self-pay | Admitting: *Deleted

## 2018-10-12 NOTE — Addendum Note (Signed)
Encounter addended by: Freeman Caldron, PA-C on: 10/12/2018 1:44 PM  Actions taken: Clinical Note Signed

## 2018-10-25 NOTE — Assessment & Plan Note (Addendum)
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 Neoadj chemo 12/24/14- 04/29/15 AC x 4 foll by Abraxane X 12 Rt Lumpectomy: Path CR 0/2 LN Adj XRT 07/23/15- 09/08/15 PET/CT scan 11/17/2016:Subcutaneous nodules in the neck, upper back, left arm, abdomen and pelvis Patient progressed on Xeloda January 2019-07/04/2017 stopped due to progression of disease Cerebellar mass diagnosed 07/12/2016: Resection followed by stereotactic radiation 07/29/2016 Lymph node from 03/02/2018 biopsy: PDL1+ ------------------------------------------------------------------------------------------------------------------------------------------------ Current treatment:Pembrolizumab given every 3 weeks starting 04/03/18, today's cycle 9 Keytruda toxicities:Hypothyroidism for which she required thyroid replacement therapy Currently on 112 mcg of Synthroid. TSH level: 0.639 on 08/03/2018  CT CAP 06/12/2018: Interval decrease in the left supraclavicular lymph node and the metastatic lesion in the subcutaneous fat of the right groin region also decreased. No change in the 6 mm lesion in the right liver  Plan: Continue with the current therapy return to clinic every 3 weeks for pembrolizumab.  I showed Maximina the MRI images at her request.  I gave her reassurance that if the clinicians believe that the MRI is consistent with treatment related changes, then we will trust that answer.  The size of concern is 19m.  We reviewed where the area was, and at her request I reviewed how cerebellar changes would manifest should she experience them.  I let her know that the area on her sternum, is normal, and there is no nodularity to her skin that would concern me for cancer spread. She does have upcoming CT scans later this month.  Her next  brain MRI is in July.     PKiyonawill return in 3 weeks for f/u with Dr. GLindi Adieand her next treatment.

## 2018-10-25 NOTE — Progress Notes (Signed)
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 [facility]) Staging form: Breast, AJCC 7th Edition - Clinical stage from 12/11/2014: Stage IIA (T2, N0, M0) - Unsigned Staging comments: Staged at breast conference on 7.20.16 - Pathologic stage from 06/16/2015: yT0, N0, cM0 - Unsigned   SUMMARY OF ONCOLOGIC HISTORY:   Breast cancer of lower-outer quadrant of right female breast ([place])   12/03/2014 Mammogram    Right breast mass 1.9 cm it o'clock position 8 cm depth from the nipple    12/03/2014 Initial Diagnosis    Right breast biopsy 8:00: Invasive ductal carcinoma, grade 3, ER 0%, PR 0%, Ki-67 90%, HER-2 negative ratio 1.43    12/10/2014 Breast MRI    Right breast lower outer quadrant: 2.3 x 2.4 x 2.4 cm rim-enhancing mass abuts the pectoralis fascia but no enhancement of pectoralis muscle, second focus of artifact?'s second tissue marker clip, no lymph nodes    12/10/2014 Clinical Stage    Stage IIA: T2 N0    12/24/2014 - 04/29/2015 Neo-Adjuvant Chemotherapy    Dose dense Adriamycin and Cytoxan 4 followed by weekly Abraxane 12    05/02/2015 Breast MRI    complete radiologic response    06/16/2015 Surgery    Left Lumpectomy: Complete path Response, 0/2 LN    06/16/2015 Pathologic Stage    ypT0 ypN0    07/23/2015 - 09/05/2015 Radiation Therapy    Adjuvant RT: 50.4 Gy in 28 fractions and a boost of 10 Gy in 5 fractions to total dose of 60.4 Gy    10/24/2015 Survivorship    SCP mailed to patient in lieu of in person visit.    07/26/2016 - 07/27/2016 Radiation Therapy     SRS brain    07/27/2016 - 07/29/2016 Hospital Admission    Cerebellar mass: Right suboccipital craniotomy for tumor resection with stereotactic navigation: Metastatic poorly differentiated adenocarcinoma with extensive necrosis positive for CK 7, MOC 31, CK 5/6; Neg for  Er/PR, GATA-3, GCDFP CDX2, Napsin A and TTF-1    11/17/2016 PET scan    Subcutaneous nodules in the neck, upper back, left arm, abdomen and pelvis,. 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 identified in BARD 1, BRCA2 and NF1    12/31/2016 Miscellaneous    Everolimus 10 mg daily for cycle 1 if she cannot tolerate will decrease to 7.5 mg daily    05/24/2017 - 07/04/2017 Chemotherapy    Xeloda 2000 mg 2 weeks on 1 week off stopped due to progression of disease based on CT scans done 06/27/2017    07/25/2017 - 03/13/2018 Chemotherapy    [place] days 1 and 8 every 3 weeks stopped for progression     03/20/2018 - 03/31/2018 Radiation Therapy    Radiation to lymph nodes    04/03/2018 -  Chemotherapy    Keytruda every 3 weeks      CURRENT THERAPY: Keytruda  INTERVAL HISTORY: Kaitlyn Keith 53 y.o. female returns for evaluation prior to receiving her every 3 week Keytruda.  She is feeling moderately well.  She notes she is very anxious and does fear her cancer getting  worse.  She says that she has been anxious about her brain MRI since the report reads concern for a 3 mm recurrence in her right cerebellum, however, she was told that they didn't think it was recurrence, and she is reassured, but still cautiously optimistic.  She is also nervous because she has gained some weight and notes that her sternum seems more prominent and is concerned cancer is there as well.  She is feeling well otherwise.  She has an occasional headache, but no other issues today.   Patient Active Problem List   Diagnosis Date Noted  . 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 Goals of care,  counseling/discussion 07/04/2017  . Genetic testing 12/10/2016  . 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 Cerebellar mass 07/27/2016  . Cerebellar tumor ([place]) 07/27/2016  . 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 C2 cervical fracture ([place]) 07/12/2016  . Hypertension 07/12/2016  . Degenerative spinal arthritis 11/18/2015  . Chemotherapy-induced peripheral neuropathy ([place]) 06/23/2015  . Paronychia of great toe, left 02/04/2015  . Breast cancer of lower-outer quadrant of right female breast ([place]) 12/05/2014    has No Known Allergies.  MEDICAL HISTORY: Past Medical History:  Diagnosis Date  . Anxiety   . Arthritis   . Back pain   . 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 Breast cancer ([place])   . Breast cancer of lower-outer quadrant of right female breast ([place]) 12/05/2014  . Depression   . FH: chemotherapy 12/2014-04/2015  . Genetic testing 12/10/2016   Kaitlyn Keith underwent genetic counseling and testing for hereditary cancer syndromes on 11/18/2016. 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 Mutations in MSH6 are associated with a hereditary cancer syndrome called Lynch syndrome. For more detailed discussion, please see genetic counseling documentation from 12/10/2016.  Testing was perfo  . Headache    due to brain cancer, no longer having them  . Hot flashes   . Hypertension   . 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 Mutations in MSH6 are associated with a hereditary cancer syndrome called Lynch syndrome. For more detailed discussion, please see genetic counseling documentation from 12/10/2016.  Testing was perfo  . 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 Radiation 07/23/15-09/05/15   right breast 50.4 Gy, boost of 10 Gy    SURGICAL HISTORY: Past Surgical History:  Procedure Laterality Date  . APPLICATION OF CRANIAL NAVIGATION Right 07/27/2016   Procedure: APPLICATION OF CRANIAL NAVIGATION;  Surgeon: Kevan Ny Ditty, MD;  Location: [facility];  Service: Neurosurgery;  Laterality: Right;  . BIOPSY OF SKIN SUBCUTANEOUS TISSUE AND/OR MUCOUS MEMBRANE Left 12/24/2016   Procedure: OPEN BIOPSY LESIONS ON LEFT LOWER BACK AND SHOULDER BLADE;  Surgeon: [name], MD;  Location: [facility];  Service: General;  Laterality: Left;  . BREAST LUMPECTOMY WITH NEEDLE LOCALIZATION AND AXILLARY SENTINEL LYMPH NODE BX Right 06/16/2015   Procedure: BREAST LUMPECTOMY WITH NEEDLE LOCALIZATION AND AXILLARY SENTINEL LYMPH NODE BX;  Surgeon: [name], MD;  Location: [facility];  Service: General;  Laterality: Right;  . BREAST REDUCTION SURGERY    . CRANIOTOMY Right 07/27/2016   Procedure: Right Suboccipital craniotomy for tumor resection with stereotactic navigation;  Surgeon: Marland Kitchen  Tamala Fothergill, MD;  Location: [facility];  Service: Neurosurgery;  Laterality: Right;  . PORT-A-CATH REMOVAL N/A 06/16/2015   Procedure: REMOVAL PORT-A-CATH;  Surgeon: [name], MD;  Location: [place];  Service: General;  Laterality: N/A;  . PORTACATH PLACEMENT N/A 12/23/2014   Procedure: INSERTION PORT-A-CATH;  Surgeon: [name], MD;  Location: [facility];  Service: General;  Laterality: N/A;  . PORTACATH PLACEMENT Left 07/08/2017   Procedure: INSERTION PORT-A-CATH;  Surgeon: [name], MD;  Location: [facility];  Service: General;  Laterality: Left;    SOCIAL HISTORY: Social History   Socioeconomic History  . Marital status: Divorced    Spouse name: Not on file  . Number of children: 1  . Years of education: Not on file  . Highest education level: Not on file  Occupational History  . Not on  file  Social Needs  . Financial resource strain: Not on file  . Food insecurity:    Worry: Not on file    Inability: Not on file  . Transportation needs:    Medical: No    Non-medical: No  Tobacco Use  . Smoking status: Never Smoker  . Smokeless tobacco: Never Used  Substance and Sexual Activity  . Alcohol use: Yes    Comment: social  . Drug use: No  . Sexual activity: Yes  Lifestyle  . Physical activity:    Days per week: Not on file    Minutes per session: Not on file  . Stress: Not on file  Relationships  . Social connections:    Talks on phone: Not on file    Gets together: Not on file    Attends religious service: Not on file    Active member of club or organization: Not on file    Attends meetings of clubs or organizations: Not on file    Relationship status: Not on file  . Intimate partner violence:    Fear of current or ex partner: No    Emotionally abused: No    Physically abused: No    Forced sexual activity: No  Other Topics Concern  . Not on file  Social History Narrative  . Not on file    FAMILY HISTORY: Family History  Problem Relation Age of Onset  . Aneurysm Mother 89       d.55  . Heart attack Father 27       d.62  . Endometrial cancer Sister 38  . Lung cancer Maternal Uncle        d.68s  . Cancer Maternal Grandmother        unspecified type-possibly stomach d.88s    Review of Systems  Constitutional: Negative for appetite change, chills, fatigue, fever and unexpected weight change.  HENT:   Negative for hearing loss, lump/mass, sore throat and trouble swallowing.   Eyes: Negative for eye problems and icterus.  Respiratory: Negative for chest tightness, cough and shortness of breath.   Cardiovascular: Negative for chest pain, leg swelling and palpitations.  Gastrointestinal: Negative for abdominal distention, abdominal pain, constipation, diarrhea, nausea and vomiting.  Endocrine: Negative for hot flashes.  Genitourinary: Negative for  difficulty urinating.   Musculoskeletal: Negative for arthralgias.  Skin: Negative for itching and rash.  Neurological: Positive for headaches. Negative for dizziness, extremity weakness and numbness.  Hematological: Negative for adenopathy. Does not bruise/bleed easily.  Psychiatric/Behavioral: Negative for depression. The patient is not nervous/anxious.       PHYSICAL EXAMINATION  ECOG PERFORMANCE STATUS: 1 - Symptomatic but completely ambulatory  Vitals:   10/26/18 0954  BP: 116/80  Pulse: 63  Resp: 18  Temp: 98.1 F (36.7 C)  SpO2: 100%    Physical Exam Constitutional:      General: She is not in acute distress.    Appearance: Normal appearance. She is not toxic-appearing.  HENT:     Head: Normocephalic and atraumatic.     Mouth/Throat:     Mouth: Mucous membranes are moist.     Pharynx: Oropharynx is clear. No oropharyngeal exudate or posterior oropharyngeal erythema.  Eyes:     General: No scleral icterus.    Pupils: Pupils are equal, round, and reactive to light.  Neck:     Musculoskeletal: Neck supple.  Cardiovascular:     Rate and Rhythm: Normal rate and regular rhythm.     Pulses: Normal pulses.     Heart sounds: Normal heart sounds.  Pulmonary:     Effort: Pulmonary effort is normal. No respiratory distress.     Breath sounds: Normal breath sounds. No wheezing.     Comments: Chest wall over sternum soft subcutaneous tissue, no nodularity noted Abdominal:     General: Abdomen is flat. There is no distension.     Palpations: Abdomen is soft.     Tenderness: There is no abdominal tenderness.  Musculoskeletal:        General: No swelling.  Lymphadenopathy:     Cervical: No cervical adenopathy.  Skin:    General: Skin is warm and dry.     Capillary Refill: Capillary refill takes less than 2 seconds.     Findings: No rash.  Neurological:     General: No focal deficit present.     Mental Status: She is alert.  Psychiatric:        Mood and Affect: Mood  normal.        Behavior: Behavior normal.     LABORATORY DATA:  CBC    Component Value Date/Time   WBC 3.6 (L) 10/26/2018 0938   WBC 3.0 (L) 03/02/2018 1202   RBC 4.68 10/26/2018 0938   HGB 13.5 10/26/2018 0938   HGB 11.7 04/04/2017 1416   HCT 42.0 10/26/2018 0938   HCT 35.9 04/04/2017 1416   PLT 182 10/26/2018 0938   PLT 207 04/04/2017 1416   MCV 89.7 10/26/2018 0938   MCV 78.4 (L) 04/04/2017 1416   MCH 28.8 10/26/2018 0938   MCHC 32.1 10/26/2018 0938   RDW 12.2 10/26/2018 0938   RDW 12.8 04/04/2017 1416   LYMPHSABS 1.0 10/26/2018 0938   LYMPHSABS 1.2 04/04/2017 1416   MONOABS 0.3 10/26/2018 0938   MONOABS 0.4 04/04/2017 1416   EOSABS 0.1 10/26/2018 0938   EOSABS 0.0 04/04/2017 1416   BASOSABS 0.0 10/26/2018 0938   BASOSABS 0.0 04/04/2017 1416    CMP     Component Value Date/Time   NA 137 10/26/2018 0938   NA 139 04/04/2017 1416   K 3.7 10/26/2018 0938   K 3.7 04/04/2017 1416   CL 104 10/26/2018 0938   CO2 24 10/26/2018 0938   CO2 24 04/04/2017 1416   GLUCOSE 96 10/26/2018 0938   GLUCOSE 76 04/04/2017 1416   BUN 10 10/26/2018 0938   BUN 5.8 (L) 04/04/2017 1416   CREATININE 0.74 10/26/2018 0938   CREATININE 0.8 04/04/2017 1416   CALCIUM 9.0 10/26/2018 0938   CALCIUM 8.6 04/04/2017 1416   PROT 7.6 10/26/2018 0938   PROT 7.0 04/04/2017 1416  ALBUMIN 3.9 10/26/2018 0938   ALBUMIN 3.0 (L) 04/04/2017 1416   AST 19 10/26/2018 0938   AST 18 04/04/2017 1416   ALT 13 10/26/2018 0938   ALT 12 04/04/2017 1416   ALKPHOS 94 10/26/2018 0938   ALKPHOS 131 04/04/2017 1416   BILITOT 0.7 10/26/2018 0938   BILITOT 0.43 04/04/2017 1416   GFRNONAA >60 10/26/2018 0938   GFRAA >60 10/26/2018 0938      ASSESSMENT and THERAPY PLAN:   Breast cancer of lower-outer quadrant of right female breast ([place]) Right breast biopsy 12/03/2014 8:00: Invasive ductal carcinoma, grade 3, ER 0%, PR 0%, Ki-67 90%, HER-2 negative ratio 1.43, 2.4 cm by MRI in 1.9 cm by ultrasound T2 N0  M0 stage II a clinical stage abuts the pectoralis muscle no lymph nodes by MRI. Neoadj chemo 12/24/14- 04/29/15 AC x 4 foll by Abraxane X 12 Rt Lumpectomy: Path CR 0/2 LN Adj XRT 07/23/15- 09/08/15 PET/CT scan 11/17/2016:Subcutaneous nodules in the neck, upper back, left arm, abdomen and pelvis Patient progressed on Xeloda January 2019-07/04/2017 stopped due to progression of disease Cerebellar mass diagnosed 07/12/2016: Resection followed by stereotactic radiation 07/29/2016 Lymph node from 03/02/2018 biopsy: PDL1+ ------------------------------------------------------------------------------------------------------------------------------------------------ Current treatment:Pembrolizumab given every 3 weeks starting 04/03/18, today's cycle 9 Keytruda toxicities:Hypothyroidism for which she required thyroid replacement therapy Currently on 112 mcg of Synthroid. TSH level: 0.639 on 08/03/2018  CT CAP 06/12/2018: Interval decrease in the left supraclavicular lymph node and the metastatic lesion in the subcutaneous fat of the right groin region also decreased. No change in the 6 mm lesion in the right liver  Plan: Continue with the current therapy return to clinic every 3 weeks for pembrolizumab.  I showed Kaitlyn Keith the MRI images at her request.  I gave her reassurance that if the clinicians believe that the MRI is consistent with treatment related changes, then we will trust that answer.  The size of concern is 31m.  We reviewed where the area was, and at her request I reviewed how cerebellar changes would manifest should she experience them.  I let her know that the area on her sternum, is normal, and there is no nodularity to her skin that would concern me for cancer spread. She does have upcoming CT scans later this month.  Her next brain MRI is in July.     Kaitlyn Keith return in 3 weeks for f/u with Dr. GLindi Adieand her next treatment.      Orders Placed This Encounter  Procedures  . CBC  with Differential (Cancer Center Only)    Standing Status:   Standing    Number of Occurrences:   52    Standing Expiration Date:   10/26/2019  . C[place] ([place])    Standing Status:   Standing    Number of Occurrences:   52    Standing Expiration Date:   10/26/2019  . TSH    Standing Status:   Standing    Number of Occurrences:   52    Standing Expiration Date:   10/26/2019    All questions were answered. The patient knows to call the clinic with any problems, questions or concerns. We can certainly see the patient much sooner if necessary.  A total of (30) minutes of face-to-face time was spent with this patient with greater than 50% of that time in counseling and care-coordination.  This note was electronically signed. LScot Dock NP 10/26/2018

## 2018-10-26 ENCOUNTER — Inpatient Hospital Stay: Payer: BC Managed Care – PPO

## 2018-10-26 ENCOUNTER — Other Ambulatory Visit: Payer: Self-pay

## 2018-10-26 ENCOUNTER — Inpatient Hospital Stay (HOSPITAL_BASED_OUTPATIENT_CLINIC_OR_DEPARTMENT_OTHER): Payer: BC Managed Care – PPO | Admitting: Adult Health

## 2018-10-26 ENCOUNTER — Encounter: Payer: Self-pay | Admitting: Adult Health

## 2018-10-26 ENCOUNTER — Inpatient Hospital Stay: Payer: BC Managed Care – PPO | Attending: Hematology and Oncology

## 2018-10-26 VITALS — BP 116/80 | HR 63 | Temp 98.1°F | Resp 18 | Ht 63.0 in | Wt 179.0 lb

## 2018-10-26 DIAGNOSIS — Z171 Estrogen receptor negative status [ER-]: Secondary | ICD-10-CM

## 2018-10-26 DIAGNOSIS — C7931 Secondary malignant neoplasm of brain: Secondary | ICD-10-CM | POA: Insufficient documentation

## 2018-10-26 DIAGNOSIS — C792 Secondary malignant neoplasm of skin: Secondary | ICD-10-CM

## 2018-10-26 DIAGNOSIS — E039 Hypothyroidism, unspecified: Secondary | ICD-10-CM | POA: Diagnosis not present

## 2018-10-26 DIAGNOSIS — Z5112 Encounter for antineoplastic immunotherapy: Secondary | ICD-10-CM | POA: Insufficient documentation

## 2018-10-26 DIAGNOSIS — C50511 Malignant neoplasm of lower-outer quadrant of right female breast: Secondary | ICD-10-CM

## 2018-10-26 DIAGNOSIS — R51 Headache: Secondary | ICD-10-CM | POA: Diagnosis not present

## 2018-10-26 LAB — CMP (CANCER CENTER ONLY)
ALT: 13 U/L (ref 0–44)
AST: 19 U/L (ref 15–41)
Albumin: 3.9 g/dL (ref 3.5–5.0)
Alkaline Phosphatase: 94 U/L (ref 38–126)
Anion gap: 9 (ref 5–15)
BUN: 10 mg/dL (ref 6–20)
CO2: 24 mmol/L (ref 22–32)
Calcium: 9 mg/dL (ref 8.9–10.3)
Chloride: 104 mmol/L (ref 98–111)
Creatinine: 0.74 mg/dL (ref 0.44–1.00)
GFR, Est AFR Am: >60 mL/min (ref >60)
GFR, Estimated: >60 mL/min (ref >60)
Glucose, Bld: 96 mg/dL (ref 70–99)
Potassium: 3.7 mmol/L (ref 3.5–5.1)
Sodium: 137 mmol/L (ref 135–145)
Total Bilirubin: 0.7 mg/dL (ref 0.3–1.2)
Total Protein: 7.6 g/dL (ref 6.5–8.1)

## 2018-10-26 LAB — CBC WITH DIFFERENTIAL (CANCER CENTER ONLY)
Abs Immature Granulocytes: 0 10*3/uL (ref 0.00–0.07)
Basophils Absolute: 0 10*3/uL (ref 0.0–0.1)
Basophils Relative: 1 %
Eosinophils Absolute: 0.1 10*3/uL (ref 0.0–0.5)
Eosinophils Relative: 3 %
HCT: 42 % (ref 36.0–46.0)
Hemoglobin: 13.5 g/dL (ref 12.0–15.0)
Immature Granulocytes: 0 %
Lymphocytes Relative: 29 %
Lymphs Abs: 1 10*3/uL (ref 0.7–4.0)
MCH: 28.8 pg (ref 26.0–34.0)
MCHC: 32.1 g/dL (ref 30.0–36.0)
MCV: 89.7 fL (ref 80.0–100.0)
Monocytes Absolute: 0.3 10*3/uL (ref 0.1–1.0)
Monocytes Relative: 8 %
Neutro Abs: 2.1 10*3/uL (ref 1.7–7.7)
Neutrophils Relative %: 59 %
Platelet Count: 182 10*3/uL (ref 150–400)
RBC: 4.68 MIL/uL (ref 3.87–5.11)
RDW: 12.2 % (ref 11.5–15.5)
WBC Count: 3.6 10*3/uL — ABNORMAL LOW (ref 4.0–10.5)
nRBC: 0 % (ref 0.0–0.2)

## 2018-10-26 LAB — TSH: TSH: 0.277 u[IU]/mL — ABNORMAL LOW (ref 0.308–3.960)

## 2018-10-26 MED ORDER — SODIUM CHLORIDE 0.9% FLUSH
10.0000 mL | INTRAVENOUS | Status: DC | PRN
  Administered 2018-10-26: 10 mL
  Filled 2018-10-26: qty 10

## 2018-10-26 MED ORDER — SODIUM CHLORIDE 0.9% FLUSH
10.0000 mL | INTRAVENOUS | Status: DC | PRN
  Administered 2018-10-26: 12:00:00 10 mL
  Filled 2018-10-26: qty 10

## 2018-10-26 MED ORDER — HEPARIN SOD (PORK) LOCK FLUSH 100 UNIT/ML IV SOLN
500.0000 [IU] | Freq: Once | INTRAVENOUS | Status: AC | PRN
  Administered 2018-10-26: 500 [IU]
  Filled 2018-10-26: qty 5

## 2018-10-26 MED ORDER — SODIUM CHLORIDE 0.9 % IV SOLN
Freq: Once | INTRAVENOUS | Status: AC
  Administered 2018-10-26: 11:00:00 via INTRAVENOUS
  Filled 2018-10-26: qty 250

## 2018-10-26 MED ORDER — SODIUM CHLORIDE 0.9 % IV SOLN
200.0000 mg | Freq: Once | INTRAVENOUS | Status: AC
  Administered 2018-10-26: 200 mg via INTRAVENOUS
  Filled 2018-10-26: qty 8

## 2018-10-26 NOTE — Patient Instructions (Signed)
[facility] Discharge Instructions for Patients Receiving Chemotherapy  Today you received the following chemotherapy agents:  Keytruda.  To help prevent nausea and vomiting after your treatment, we encourage you to take your nausea medication as directed.   If you develop nausea and vomiting that is not controlled by your nausea medication, call the clinic.   BELOW ARE SYMPTOMS THAT SHOULD BE REPORTED IMMEDIATELY:  *FEVER GREATER THAN 100.5 F  *CHILLS WITH OR WITHOUT FEVER  NAUSEA AND VOMITING THAT IS NOT CONTROLLED WITH YOUR NAUSEA MEDICATION  *UNUSUAL SHORTNESS OF BREATH  *UNUSUAL BRUISING OR BLEEDING  TENDERNESS IN MOUTH AND THROAT WITH OR WITHOUT PRESENCE OF ULCERS  *URINARY PROBLEMS  *BOWEL PROBLEMS  UNUSUAL RASH Items with * indicate a potential emergency and should be followed up as soon as possible.  Feel free to call the clinic should you have any questions or concerns. The clinic phone number is [phone].  Please show the CHEMO ALERT CARD at check-in to the Emergency Department and triage nurse.    

## 2018-10-26 NOTE — Progress Notes (Signed)
Okay to proceed with [facility] treatment today without CBC per Dr. Lindi Adie.

## 2018-10-30 ENCOUNTER — Ambulatory Visit (HOSPITAL_BASED_OUTPATIENT_CLINIC_OR_DEPARTMENT_OTHER): Payer: BLUE CROSS/BLUE SHIELD | Admitting: Hematology and Oncology

## 2018-10-30 ENCOUNTER — Telehealth: Payer: Self-pay | Admitting: *Deleted

## 2018-10-30 DIAGNOSIS — C50511 Malignant neoplasm of lower-outer quadrant of right female breast: Secondary | ICD-10-CM

## 2018-10-30 DIAGNOSIS — Z171 Estrogen receptor negative status [ER-]: Secondary | ICD-10-CM

## 2018-10-30 DIAGNOSIS — Z923 Personal history of irradiation: Secondary | ICD-10-CM | POA: Diagnosis not present

## 2018-10-30 DIAGNOSIS — E039 Hypothyroidism, unspecified: Secondary | ICD-10-CM

## 2018-10-30 DIAGNOSIS — Z79899 Other long term (current) drug therapy: Secondary | ICD-10-CM

## 2018-10-30 DIAGNOSIS — Z9221 Personal history of antineoplastic chemotherapy: Secondary | ICD-10-CM | POA: Diagnosis not present

## 2018-10-30 MED ORDER — TRIAMCINOLONE ACETONIDE 0.5 % EX OINT: 1.0000 "application " | TOPICAL_OINTMENT | Freq: Two times a day (BID) | CUTANEOUS | 0 refills | Status: DC

## 2018-10-30 MED ORDER — DEXAMETHASONE 0.1 % OP SUSP: 1.0000 [drp] | Freq: Three times a day (TID) | OPHTHALMIC | 0 refills | Status: DC | PRN

## 2018-10-30 NOTE — Progress Notes (Signed)
HEMATOLOGY-ONCOLOGY DOXIMITY VISIT PROGRESS NOTE  Patient Care Team: [name], MD as PCP - General (Hematology and Oncology) [name], [name], [name], [name], [name], [name], NP (Inactive) as Nurse Practitioner (Nurse Practitioner)  I connected with Kaitlyn Keith by [employer] and verified that I am speaking with the correct person using two identifiers.  I discussed the limitations, risks, security and privacy concerns of performing an evaluation and management service by [facility] and the availability of in person appointments.  I also discussed with the patient that there may be a patient responsible charge related to this service. The patient expressed understanding and agreed to proceed.  Patient's Location: Home Physician Location: Clinic  DIAGNOSIS:  Encounter Diagnosis  Name Primary?  . 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, estrogen receptor negative ([place])     SUMMARY OF ONCOLOGIC HISTORY:   Breast cancer of lower-outer quadrant of right female breast ([place])   12/03/2014 Mammogram    Right breast mass 1.9 cm it o'clock position 8 cm depth from the nipple    12/03/2014 Initial Diagnosis    Right breast biopsy 8:00: Invasive ductal carcinoma, grade 3, ER 0%, PR 0%, Ki-67 90%, HER-2 negative ratio 1.43    12/10/2014 Breast MRI    Right breast lower outer quadrant: 2.3 x 2.4 x 2.4 cm rim-enhancing mass abuts the pectoralis fascia but no enhancement of pectoralis muscle, second focus of artifact?'s second tissue marker clip, no lymph nodes    12/10/2014 Clinical Stage    Stage IIA: T2 N0    12/24/2014 - 04/29/2015 Neo-Adjuvant Chemotherapy    Dose dense Adriamycin and Cytoxan 4 followed by  weekly Abraxane 12    05/02/2015 Breast MRI    complete radiologic response    06/16/2015 Surgery    Left Lumpectomy: Complete path Response, 0/2 LN    06/16/2015 Pathologic Stage    ypT0 ypN0    07/23/2015 - 09/05/2015 Radiation Therapy    Adjuvant RT: 50.4 Gy in 28 fractions and a boost of 10 Gy in 5 fractions to total dose of 60.4 Gy    10/24/2015 Survivorship    SCP mailed to patient in lieu of in person visit.    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 identified in BARD 1, BRCA2 and NF1    12/31/2016 Miscellaneous    Everolimus 10 mg daily for cycle 1 if she cannot tolerate will decrease to 7.5 mg daily    05/24/2017 - 07/04/2017 Chemotherapy    Xeloda 2000 mg 2 weeks on 1 week off stopped due to progression  of disease based on CT scans done 06/27/2017    07/25/2017 - 03/13/2018 Chemotherapy    [place] days 1 and 8 every 3 weeks stopped for progression     03/20/2018 - 03/31/2018 Radiation Therapy    Radiation to lymph nodes    04/03/2018 -  Chemotherapy    Keytruda every 3 weeks      CHIEF COMPLIANT: Severe intractable scalp/head pain for 48 hours after last infusion, right eye profound dryness  INTERVAL HISTORY:  Kaitlyn Keith is a 53 year old with above-mentioned history metastatic breast cancer who is currently receiving pembrolizumab immunotherapy.  After the last treatment she had intractable pain that lasted for 48 hours along the right surgical scar on the scalp.  She finally took some Aleve and that seemed to have improved her symptoms.  It was so excruciating that she called [place] today to set up this appointment.  She wanted to know if her treatment dosage may need to be changed to avoid this in the future.  She also is complaining of right eye dryness that accompanied this head pain.  REVIEW OF SYSTEMS:   Constitutional: Denies fevers, chills or abnormal weight loss Eyes: Dryness of the right eye, severe right-sided scalp pain Ears, nose, mouth, throat, and face: Denies mucositis or sore throat Respiratory: Denies cough, dyspnea or wheezes Cardiovascular: Denies palpitation, chest discomfort Gastrointestinal:  Denies nausea, heartburn or change in bowel habits Skin: Denies abnormal skin rashes Lymphatics: Denies new lymphadenopathy or easy bruising Neurological:Denies numbness, tingling or new weaknesses Behavioral/Psych: Mood is stable, no new changes  Extremities: No lower extremity edema   All other systems were reviewed with the patient and are negative.  I have reviewed the past medical history, past surgical history, social history and family history with the patient and they are unchanged from previous note.  ALLERGIES:  has No Known Allergies.  MEDICATIONS:  Current Outpatient Medications  Medication Sig Dispense Refill  . ALPRAZolam (XANAX) 2 MG tablet Take 1 tablet (2 mg total) by mouth at bedtime. 30 tablet 5  . betamethasone valerate ointment (VALISONE) 0.1 % Apply 1 application topically 2 (two) times daily. 30 g 1  . cyclobenzaprine (FLEXERIL) 5 MG tablet Take 1 tablet (5 mg total) by mouth 3 (three) times daily as needed for muscle spasms. 30 tablet 0  .  dexamethasone (DECADRON) 0.1 % ophthalmic suspension Place 1 drop into the right eye every 8 (eight) hours as needed. 5 mL 0  . levothyroxine (SYNTHROID, LEVOTHROID) 125 MCG tablet TAKE 1 TABLET (125 MCG TOTAL) BY MOUTH DAILY BEFORE BREAKFAST. 90 tablet 3  . lidocaine-prilocaine (EMLA) cream APPLY TO AFFECTED AREA ONCE AS DIRECTED 30 g 3  . lisinopril-hydrochlorothiazide (PRINZIDE,ZESTORETIC) 20-12.5 MG tablet Take 1 tablet by mouth 2 (two) times daily. 180 tablet 3  . triamcinolone ointment (KENALOG) 0.5 % Apply 1 application topically 2 (two) times daily. 30 g 0   No current facility-administered medications for this visit.     PHYSICAL EXAMINATION: ECOG PERFORMANCE STATUS: 1 - Symptomatic but completely ambulatory   LABORATORY DATA:  I have reviewed the data as listed CMP Latest Ref Rng & Units 10/26/2018 10/05/2018 09/14/2018  Glucose 70 - 99 mg/dL 96 94 89  BUN 6 - 20 mg/dL _0 Creatinine 0.44 - 1.00 mg/dL 0.74 0.73 0.73  Sodium 135 - 145 mmol/L 137 138 140  Potassium 3.5 - 5.1 mmol/L 3.7 3.7 3.7  Chloride 98 - 111 mmol/L 104 104 105  CO2 22 - 32  mmol/L _0 Calcium 8.9 - 10.3 mg/dL 9.0 9.2 9.2  Total Protein 6.5 - 8.1 g/dL 7.6 7.5 7.8  Total Bilirubin 0.3 - 1.2 mg/dL 0.7 0.6 0.7  Alkaline Phos 38 - 126 U/L 94 97 89  AST 15 - 41 U/L _1 ALT 0 - 44 U/L _2 Lab Results  Component Value Date   WBC 3.6 (L) 10/26/2018   HGB 13.5 10/26/2018   HCT 42.0 10/26/2018   MCV 89.7 10/26/2018   PLT 182 10/26/2018   NEUTROABS 2.1 10/26/2018    ASSESSMENT & PLAN:  Breast cancer of lower-outer quadrant of right female breast ([place]) Right breast biopsy 12/03/2014 8:00: Invasive ductal carcinoma, grade 3, ER 0%, PR 0%, 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 Neoadj chemo 12/24/14- 04/29/15 AC x 4 foll by Abraxane X 12 Rt Lumpectomy: Path CR 0/2 LN  Adj XRT 07/23/15- 09/08/15 PET/CT scan 11/17/2016:Subcutaneous nodules in the neck, upper back, left arm, abdomen and pelvis Patient progressed on Xeloda January 2019-07/04/2017 stopped due to progression of disease Cerebellar mass diagnosed 07/12/2016: Resection followed by stereotactic radiation 07/29/2016 Lymph node from 03/02/2018 biopsy: PDL1+ ------------------------------------------------------------------------------------------------------------------------------------------------ Current treatment:Pembrolizumab given every 3 weeks starting 04/03/18 Keytruda toxicities:Hypothyroidism for which she required thyroid replacement therapy Currently on 112 mcg of Synthroid. TSH level: 0.639 on 08/03/2018  CT CAP 06/12/2018: Interval decrease in the left supraclavicular lymph node and the metastatic lesion in the subcutaneous fat of the right groin region also decreased. No change in the 6 mm lesion in the right liver  Keytruda toxicities: 1.  Severe scalp pain along the surgical scars very intense lasting for 48 hours improved with Aleve.  We discussed that this could be an immunotherapy side effect.  I instructed her to take gabapentin when the symptoms like that happen.  I also gave her a prescription for triamcinolone ointment. 2.  Right eye dryness: I sent a prescription for dexamethasone eyedrops.  I discussed with her that since the symptoms were transient and have returned back to normal, I do not recommend any change in the dosing. We will reassess her symptoms when she comes back for treatment.   I discussed the assessment and treatment plan with the patient. The patient was provided an opportunity to ask questions and all were answered. The patient agreed with the plan and demonstrated an understanding of the instructions. The patient was advised to call back or seek an in-person evaluation if the symptoms worsen or if the condition fails to improve as anticipated.    I provided  15 minutes of face-to-face [facility] time during this encounter.     Harriette Ohara, MD 10/30/18

## 2018-10-30 NOTE — Assessment & Plan Note (Addendum)
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 Neoadj chemo 12/24/14- 04/29/15 AC x 4 foll by Abraxane X 12 Rt Lumpectomy: Path CR 0/2 LN Adj XRT 07/23/15- 09/08/15 PET/CT scan 11/17/2016:Subcutaneous nodules in the neck, upper back, left arm, abdomen and pelvis Patient progressed on Xeloda January 2019-07/04/2017 stopped due to progression of disease Cerebellar mass diagnosed 07/12/2016: Resection followed by stereotactic radiation 07/29/2016 Lymph node from 03/02/2018 biopsy: PDL1+ ------------------------------------------------------------------------------------------------------------------------------------------------ Current treatment:Pembrolizumab given every 3 weeks starting 04/03/18 Keytruda toxicities:Hypothyroidism for which she required thyroid replacement therapy Currently on 112 mcg of Synthroid. TSH level: 0.639 on 08/03/2018  CT CAP 06/12/2018: Interval decrease in the left supraclavicular lymph node and the metastatic lesion in the subcutaneous fat of the right groin region also decreased. No change in the 6 mm lesion in the right liver  Keytruda toxicities: 1.  Severe scalp pain along the surgical scars very intense lasting for 48 hours improved with Aleve.  We discussed that this could be an immunotherapy side effect.  I instructed her to take gabapentin when the symptoms like that happen.  I also gave her a prescription for triamcinolone ointment. 2.  Right eye dryness: I sent a prescription for dexamethasone eyedrops.  I discussed with her that since the symptoms were transient and have returned back to normal, I do not recommend any change in the dosing. We will reassess her symptoms when she comes back for treatment.

## 2018-10-30 NOTE — Telephone Encounter (Signed)
Pt called with c/o severe h/a and right eye dryness after her tx on 6/4. Pt informed she started having confusion on Thursday 6/4 after her tx and on Firday 6/5 she started to have a severe h/a that lasted until Saturday 6/6. On Sunday 6/7 the h/a went away but her right eye was try "it feels like my right socket is dry". No c/o of h/a today, only right dry eye. Pt relate these symptoms started with her last tx on 5/14 but "they weren't bad, I could deal with them". Informed Dr. Lindi Adie of pt symptoms. Received order for appt at 9:30 today 6/8. Called pt and confirmed/scheduled appt for 9:30. Pt denies further needs at this time.

## 2018-11-06 ENCOUNTER — Telehealth: Payer: Self-pay | Admitting: *Deleted

## 2018-11-06 ENCOUNTER — Other Ambulatory Visit: Payer: Self-pay | Admitting: *Deleted

## 2018-11-06 DIAGNOSIS — C50919 Malignant neoplasm of unspecified site of unspecified female breast: Secondary | ICD-10-CM

## 2018-11-06 NOTE — Telephone Encounter (Signed)
Pt called with c/o persistent right temporal h/a since October 31, 2018. Pt describe as throbbing. Report right eye dryness is relieved. No facial dropping or weakness, no slurred speech.  Brain MRI ordered per Dr. Lindi Adie- scheduled and confirmed for 6/16 at 6pm. Pt denies further needs at this time.. Encourage pt to call with questions.

## 2018-11-07 ENCOUNTER — Other Ambulatory Visit: Payer: Self-pay

## 2018-11-07 ENCOUNTER — Ambulatory Visit
Admission: RE | Admit: 2018-11-07 | Discharge: 2018-11-07 | Disposition: A | Discharge status: Home / Self Care | Payer: BLUE CROSS/BLUE SHIELD | Source: Ambulatory Visit | Attending: Hematology and Oncology | Admitting: Hematology and Oncology

## 2018-11-07 DIAGNOSIS — C50919 Malignant neoplasm of unspecified site of unspecified female breast: Secondary | ICD-10-CM

## 2018-11-07 MED ORDER — GADOBENATE DIMEGLUMINE 529 MG/ML IV SOLN
15.0000 mL | Freq: Once | INTRAVENOUS | Status: AC | PRN
  Administered 2018-11-07: 15 mL via INTRAVENOUS

## 2018-11-08 ENCOUNTER — Other Ambulatory Visit: Payer: Self-pay | Admitting: *Deleted

## 2018-11-08 ENCOUNTER — Telehealth: Payer: Self-pay | Admitting: *Deleted

## 2018-11-08 MED ORDER — TRAMADOL HCL 50 MG PO TABS: 50.0000 mg | ORAL_TABLET | Freq: Two times a day (BID) | ORAL | 0 refills | Status: DC | PRN

## 2018-11-08 NOTE — Telephone Encounter (Signed)
Pt called to inform unable to tolerate percocet. Per Dr. Lindi Adie new script sent for Tramadol 50mg . Pt notified.

## 2018-11-08 NOTE — Telephone Encounter (Signed)
Pt request call sister Leane Call with MRI results. Called pt and discussed no change from May MRI. Offered consult with Dr. Mickeal Skinner for management of h/a. [name] will discuss with pt

## 2018-11-09 NOTE — Assessment & Plan Note (Signed)
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 Neoadj chemo 12/24/14- 04/29/15 AC x 4 foll by Abraxane X 12 Rt Lumpectomy: Path CR 0/2 LN Adj XRT 07/23/15- 09/08/15 PET/CT scan 11/17/2016:Subcutaneous nodules in the neck, upper back, left arm, abdomen and pelvis Patient progressed on Xeloda January 2019-07/04/2017 stopped due to progression of disease Cerebellar mass diagnosed 07/12/2016: Resection followed by stereotactic radiation 07/29/2016 Lymph node from 03/02/2018 biopsy: PDL1+ ------------------------------------------------------------------------------------------------------------------------------------------------ Current treatment:Pembrolizumab given every 3 weeks starting 04/03/18 Keytruda toxicities:Hypothyroidism for which she required thyroid replacement therapy Currently on 112 mcg of Synthroid. TSH level: 0.639 on 08/03/2018  CT CAP 06/12/2018: Interval decrease in the left supraclavicular lymph node and the metastatic lesion in the subcutaneous fat of the right groin region also decreased. No change in the 6 mm lesion in the right liver  Keytruda toxicities: 1.  Severe scalp pain along the surgical scars very intense lasting for 48 hours improved with Aleve. Takes gabapentin, applies triamcinolone ointment  2.  Right eye dryness: dexamethasone eyedrops. 3.  Headaches: Brain MRI 11/07/2018: Unchanged from before.  3 mm enhancing nodule in the right lateral cerebellum which was determined to be scar tissue by brain tumor board.  We will plan to do scans and follow-up

## 2018-11-15 NOTE — Progress Notes (Signed)
Patient Care Team: [name], MD as PCP - General (Hematology and Oncology) [name], [name], [name], [name], [name], [name], NP (Inactive) as Nurse Practitioner (Nurse Practitioner)  DIAGNOSIS:    ICD-10-CM   1. 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%, HER-2 negative ratio 1.43   12/10/2014 Breast MRI   Right breast lower outer quadrant: 2.3 x 2.4 x 2.4 cm rim-enhancing mass abuts the pectoralis fascia but no enhancement of pectoralis muscle, second focus of artifact?'s second tissue marker clip, no lymph nodes   12/10/2014 Clinical Stage   Stage IIA: T2 N0   12/24/2014 - 04/29/2015 Neo-Adjuvant Chemotherapy   Dose dense Adriamycin and Cytoxan 4 followed by weekly Abraxane 12   05/02/2015 Breast MRI   complete radiologic response   06/16/2015 Surgery   Left Lumpectomy: Complete path Response, 0/2 LN   06/16/2015 Pathologic Stage   ypT0 ypN0   07/23/2015 - 09/05/2015 Radiation Therapy   Adjuvant RT: 50.4 Gy in 28 fractions and a boost of 10 Gy in 5 fractions to total dose of 60.4 Gy   10/24/2015 Survivorship   SCP mailed to patient in lieu of in person visit.   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 in BARD 1, BRCA2 and NF1   12/31/2016 Miscellaneous   Everolimus 10 mg daily for cycle 1 if she cannot tolerate will decrease to 7.5 mg daily   05/24/2017 - 07/04/2017 Chemotherapy   Xeloda 2000 mg 2 weeks on 1 week off stopped due to progression of disease based on CT scans done 06/27/2017   07/25/2017 - 03/13/2018 Chemotherapy   Halaven days 1 and 8 every 3 weeks stopped for progression    03/20/2018 - 03/31/2018 Radiation Therapy   Radiation to lymph nodes   04/03/2018 -  Chemotherapy   Keytruda every 3 weeks      CHIEF COMPLIANT: Cycle 12 Keytruda  INTERVAL HISTORY: Kaitlyn Keith is a 53 y.o. with above-mentioned history of metastatic breast cancer who is currently receiving pembrolizumab immunotherapy. She had severe scalp pain and right eye dryness with her last treatment. She presents to the clinic today for cycle 12.  She had headaches for  a month and we obtained a brain MRI which was negative.  REVIEW OF SYSTEMS:   Constitutional: Denies fevers, chills or abnormal weight loss Eyes: Denies blurriness of vision Ears, nose, mouth, throat, and face: Denies mucositis or sore throat Respiratory: Denies cough, dyspnea or wheezes Cardiovascular: Denies palpitation, chest discomfort Gastrointestinal: Denies nausea, heartburn or change  in bowel habits Skin: Denies abnormal skin rashes Lymphatics: Denies new lymphadenopathy or easy bruising Neurological: Denies numbness, tingling or new weaknesses Behavioral/Psych: Mood is stable, no new changes  Extremities: No lower extremity edema Breast: denies any pain or lumps or nodules in either breasts All other systems were reviewed with the patient and are negative.  I have reviewed the past medical history, past surgical history, social history and family history with the patient and they are unchanged from previous note.  ALLERGIES:  has No Known Allergies.  MEDICATIONS:  Current Outpatient Medications  Medication Sig Dispense Refill  . ALPRAZolam (XANAX) 2 MG tablet Take 1 tablet (2 mg total) by mouth at bedtime. 30 tablet 5  . betamethasone valerate ointment (VALISONE) 0.1 % Apply 1 application topically 2 (two) times daily. 30 g 1  . cyclobenzaprine (FLEXERIL) 5 MG tablet Take 1 tablet (5 mg total) by mouth 3 (three) times daily as needed for muscle spasms. 30 tablet 0  . dexamethasone (DECADRON) 0.1 % ophthalmic suspension Place 1 drop into the right eye every 8 (eight) hours as needed. 5 mL 0  . levothyroxine (SYNTHROID) 112 MCG tablet Take 1 tablet (112 mcg total) by mouth daily before breakfast. 30 tablet 3  . lidocaine-prilocaine (EMLA) cream APPLY TO AFFECTED AREA ONCE AS DIRECTED 30 g 3  . lisinopril-hydrochlorothiazide (PRINZIDE,ZESTORETIC) 20-12.5 MG tablet Take 1 tablet by mouth 2 (two) times daily. 180 tablet 3  . traMADol (ULTRAM) 50 MG tablet Take 1 tablet (50 mg total) by mouth every 12 (twelve) hours as needed for moderate pain. 30 tablet 0  . triamcinolone ointment (KENALOG) 0.5 % Apply 1 application topically 2 (two) times daily. 30 g 0   No current facility-administered medications for this visit.     PHYSICAL EXAMINATION: ECOG PERFORMANCE STATUS: 1 - Symptomatic but completely ambulatory  Vitals:   11/16/18 0916  BP: 137/83  Pulse: (!) 56  Resp:  18  Temp: (!) 97.1 F (36.2 C)  SpO2: 100%   Filed Weights   11/16/18 0916  Weight: 178 lb 3.2 oz (80.8 kg)    GENERAL: alert, no distress and comfortable SKIN: skin color, texture, turgor are normal, no rashes or significant lesions EYES: normal, Conjunctiva are pink and non-injected, sclera clear OROPHARYNX: no exudate, no erythema and lips, buccal mucosa, and tongue normal  NECK: supple, thyroid normal size, non-tender, without nodularity LYMPH: no palpable lymphadenopathy in the cervical, axillary or inguinal LUNGS: clear to auscultation and percussion with normal breathing effort HEART: regular rate & rhythm and no murmurs and no lower extremity edema ABDOMEN: abdomen soft, non-tender and normal bowel sounds MUSCULOSKELETAL: no cyanosis of digits and no clubbing  NEURO: alert & oriented x 3 with fluent speech, no focal motor/sensory deficits EXTREMITIES: No lower extremity edema  LABORATORY DATA:  I have reviewed the data as listed CMP Latest Ref Rng & Units 10/26/2018 10/05/2018 09/14/2018  Glucose 70 - 99 mg/dL 96 94 89  BUN 6 - 20 mg/dL '10 12 16  ' Creatinine 0.44 - 1.00 mg/dL 0.74 0.73 0.73  Sodium 135 - 145 mmol/L 137 138 140  Potassium 3.5 - 5.1 mmol/L 3.7 3.7  3.7  Chloride 98 - 111 mmol/L 104 104 105  CO2 22 - 32 mmol/L '24 26 25  ' Calcium 8.9 - 10.3 mg/dL 9.0 9.2 9.2  Total Protein 6.5 - 8.1 g/dL 7.6 7.5 7.8  Total Bilirubin 0.3 - 1.2 mg/dL 0.7 0.6 0.7  Alkaline Phos 38 - 126 U/L 94 97 89  AST 15 - 41 U/L '19 15 17  ' ALT 0 - 44 U/L '13 12 10    ' Lab Results  Component Value Date   WBC 3.5 (L) 11/16/2018   HGB 13.3 11/16/2018   HCT 41.3 11/16/2018   MCV 90.2 11/16/2018   PLT 156 11/16/2018   NEUTROABS 2.2 11/16/2018    ASSESSMENT & PLAN:  Breast cancer of lower-outer quadrant of right female breast ([place]) Right breast biopsy 12/03/2014 8:00: Invasive ductal carcinoma, grade 3, ER 0%, PR 0%, Ki-67 90%, HER-2 negative ratio 1.43, 2.4 cm by MRI in 1.9 cm by ultrasound  T2 N0 M0 stage II a clinical stage abuts the pectoralis muscle no lymph nodes by MRI. Neoadj chemo 12/24/14- 04/29/15 AC x 4 foll by Abraxane X 12 Rt Lumpectomy: Path CR 0/2 LN Adj XRT 07/23/15- 09/08/15 PET/CT scan 11/17/2016:Subcutaneous nodules in the neck, upper back, left arm, abdomen and pelvis Patient progressed on Xeloda January 2019-07/04/2017 stopped due to progression of disease Cerebellar mass diagnosed 07/12/2016: Resection followed by stereotactic radiation 07/29/2016 Lymph node from 03/02/2018 biopsy: PDL1+ ------------------------------------------------------------------------------------------------------------------------------------------------ Current treatment:Pembrolizumab given every 3 weeks starting 04/03/18 Keytruda toxicities:Hypothyroidism for which she required thyroid replacement therapy Currently on 112 mcg of Synthroid. TSH level: 0.639 on 08/03/2018  CT CAP 06/12/2018: Interval decrease in the left supraclavicular lymph node and the metastatic lesion in the subcutaneous fat of the right groin region also decreased. No change in the 6 mm lesion in the right liver  Keytruda toxicities: 1.  Severe scalp pain along the surgical scars very intense lasting for 48 hours improved with Aleve. Takes gabapentin, applies triamcinolone ointment  2.  Right eye dryness: dexamethasone eyedrops. 3.  Headaches: Brain MRI 11/07/2018: Unchanged from before.  3 mm enhancing nodule in the right lateral cerebellum which was determined to be scar tissue by brain tumor board. 4.  Hypothyroidism: I will decrease the dosage of Synthroid today to 112 mcg daily.  We will plan to do scans and follow-up before the next cycle  No orders of the defined types were placed in this encounter.  The patient has a good understanding of the overall plan. she agrees with it. she will call with any problems that may develop before the next visit here.  [name], MD 11/16/2018  Julious Oka Dorshimer  am acting as scribe for Dr. [name].  I have reviewed the above documentation for accuracy and completeness, and I agree with the above.

## 2018-11-16 ENCOUNTER — Other Ambulatory Visit: Payer: Self-pay

## 2018-11-16 ENCOUNTER — Encounter: Payer: Self-pay | Admitting: *Deleted

## 2018-11-16 ENCOUNTER — Inpatient Hospital Stay (HOSPITAL_BASED_OUTPATIENT_CLINIC_OR_DEPARTMENT_OTHER): Payer: BC Managed Care – PPO | Admitting: Hematology and Oncology

## 2018-11-16 ENCOUNTER — Inpatient Hospital Stay: Payer: BC Managed Care – PPO

## 2018-11-16 DIAGNOSIS — C7931 Secondary malignant neoplasm of brain: Secondary | ICD-10-CM

## 2018-11-16 DIAGNOSIS — C50511 Malignant neoplasm of lower-outer quadrant of right female breast: Secondary | ICD-10-CM

## 2018-11-16 DIAGNOSIS — Z171 Estrogen receptor negative status [ER-]: Secondary | ICD-10-CM

## 2018-11-16 DIAGNOSIS — E039 Hypothyroidism, unspecified: Secondary | ICD-10-CM

## 2018-11-16 DIAGNOSIS — Z5112 Encounter for antineoplastic immunotherapy: Secondary | ICD-10-CM | POA: Diagnosis not present

## 2018-11-16 DIAGNOSIS — C792 Secondary malignant neoplasm of skin: Secondary | ICD-10-CM

## 2018-11-16 DIAGNOSIS — R51 Headache: Secondary | ICD-10-CM

## 2018-11-16 LAB — CBC WITH DIFFERENTIAL (CANCER CENTER ONLY)
Abs Immature Granulocytes: 0 10*3/uL (ref 0.00–0.07)
Basophils Absolute: 0 10*3/uL (ref 0.0–0.1)
Basophils Relative: 1 %
Eosinophils Absolute: 0.1 10*3/uL (ref 0.0–0.5)
Eosinophils Relative: 4 %
HCT: 41.3 % (ref 36.0–46.0)
Hemoglobin: 13.3 g/dL (ref 12.0–15.0)
Immature Granulocytes: 0 %
Lymphocytes Relative: 25 %
Lymphs Abs: 0.9 10*3/uL (ref 0.7–4.0)
MCH: 29 pg (ref 26.0–34.0)
MCHC: 32.2 g/dL (ref 30.0–36.0)
MCV: 90.2 fL (ref 80.0–100.0)
Monocytes Absolute: 0.3 10*3/uL (ref 0.1–1.0)
Monocytes Relative: 8 %
Neutro Abs: 2.2 10*3/uL (ref 1.7–7.7)
Neutrophils Relative %: 62 %
Platelet Count: 156 10*3/uL (ref 150–400)
RBC: 4.58 MIL/uL (ref 3.87–5.11)
RDW: 12.1 % (ref 11.5–15.5)
WBC Count: 3.5 10*3/uL — ABNORMAL LOW (ref 4.0–10.5)
nRBC: 0 % (ref 0.0–0.2)

## 2018-11-16 LAB — CMP (CANCER CENTER ONLY)
ALT: 12 U/L (ref 0–44)
AST: 17 U/L (ref 15–41)
Albumin: 3.7 g/dL (ref 3.5–5.0)
Alkaline Phosphatase: 85 U/L (ref 38–126)
Anion gap: 7 (ref 5–15)
BUN: 13 mg/dL (ref 6–20)
CO2: 26 mmol/L (ref 22–32)
Calcium: 8.9 mg/dL (ref 8.9–10.3)
Chloride: 107 mmol/L (ref 98–111)
Creatinine: 0.79 mg/dL (ref 0.44–1.00)
GFR, Est AFR Am: >60 mL/min (ref >60)
GFR, Estimated: >60 mL/min (ref >60)
Glucose, Bld: 97 mg/dL (ref 70–99)
Potassium: 3.9 mmol/L (ref 3.5–5.1)
Sodium: 140 mmol/L (ref 135–145)
Total Bilirubin: 0.7 mg/dL (ref 0.3–1.2)
Total Protein: 7 g/dL (ref 6.5–8.1)

## 2018-11-16 LAB — TSH: TSH: 1.017 u[IU]/mL (ref 0.308–3.960)

## 2018-11-16 MED ORDER — SODIUM CHLORIDE 0.9% FLUSH
10.0000 mL | INTRAVENOUS | Status: DC | PRN
  Administered 2018-11-16: 11:00:00 10 mL
  Filled 2018-11-16: qty 10

## 2018-11-16 MED ORDER — SODIUM CHLORIDE 0.9% FLUSH
10.0000 mL | INTRAVENOUS | Status: DC | PRN
  Administered 2018-11-16: 10 mL
  Filled 2018-11-16: qty 10

## 2018-11-16 MED ORDER — LEVOTHYROXINE SODIUM 112 MCG PO TABS: 112.0000 ug | ORAL_TABLET | Freq: Every day | ORAL | 3 refills | Status: DC

## 2018-11-16 MED ORDER — SODIUM CHLORIDE 0.9 % IV SOLN
200.0000 mg | Freq: Once | INTRAVENOUS | Status: AC
  Administered 2018-11-16: 200 mg via INTRAVENOUS
  Filled 2018-11-16: qty 8

## 2018-11-16 MED ORDER — SODIUM CHLORIDE 0.9 % IV SOLN
Freq: Once | INTRAVENOUS | Status: AC
  Administered 2018-11-16: 10:00:00 via INTRAVENOUS
  Filled 2018-11-16: qty 250

## 2018-11-16 MED ORDER — HEPARIN SOD (PORK) LOCK FLUSH 100 UNIT/ML IV SOLN
500.0000 [IU] | Freq: Once | INTRAVENOUS | Status: AC | PRN
  Administered 2018-11-16: 500 [IU]
  Filled 2018-11-16: qty 5

## 2018-11-16 NOTE — Patient Instructions (Signed)
[facility] Discharge Instructions for Patients Receiving Chemotherapy  Today you received the following chemotherapy agents:  Keytruda.  To help prevent nausea and vomiting after your treatment, we encourage you to take your nausea medication as directed.   If you develop nausea and vomiting that is not controlled by your nausea medication, call the clinic.   BELOW ARE SYMPTOMS THAT SHOULD BE REPORTED IMMEDIATELY:  *FEVER GREATER THAN 100.5 F  *CHILLS WITH OR WITHOUT FEVER  NAUSEA AND VOMITING THAT IS NOT CONTROLLED WITH YOUR NAUSEA MEDICATION  *UNUSUAL SHORTNESS OF BREATH  *UNUSUAL BRUISING OR BLEEDING  TENDERNESS IN MOUTH AND THROAT WITH OR WITHOUT PRESENCE OF ULCERS  *URINARY PROBLEMS  *BOWEL PROBLEMS  UNUSUAL RASH Items with * indicate a potential emergency and should be followed up as soon as possible.  Feel free to call the clinic should you have any questions or concerns. The clinic phone number is [phone].  Please show the CHEMO ALERT CARD at check-in to the Emergency Department and triage nurse.    

## 2018-11-16 NOTE — Patient Instructions (Signed)

## 2018-11-17 ENCOUNTER — Ambulatory Visit (HOSPITAL_COMMUNITY): Admission: RE | Admit: 2018-11-17 | Payer: BC Managed Care – PPO | Source: Ambulatory Visit

## 2018-11-20 ENCOUNTER — Encounter: Payer: Self-pay | Admitting: *Deleted

## 2018-11-30 NOTE — Assessment & Plan Note (Signed)
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 Neoadj chemo 12/24/14- 04/29/15 AC x 4 foll by Abraxane X 12 Rt Lumpectomy: Path CR 0/2 LN Adj XRT 07/23/15- 09/08/15 PET/CT scan 11/17/2016:Subcutaneous nodules in the neck, upper back, left arm, abdomen and pelvis Patient progressed on Xeloda January 2019-07/04/2017 stopped due to progression of disease Cerebellar mass diagnosed 07/12/2016: Resection followed by stereotactic radiation 07/29/2016 Lymph node from 03/02/2018 biopsy: PDL1+ ------------------------------------------------------------------------------------------------------------------------------------------------ Current treatment:Pembrolizumab given every 3 weeks starting 04/03/18, today cycle 13 Keytruda toxicities:Hypothyroidism for which she required thyroid replacement therapy Currently on 112 mcg of Synthroid. TSH level: 0.639 on 08/03/2018  CT CAP 06/12/2018: Interval decrease in the left supraclavicular lymph node and the metastatic lesion in the subcutaneous fat of the right groin region also decreased. No change in the 6 mm lesion in the right liver  Keytruda toxicities: 1.Severe scalp pain along the surgical scars very intense lasting for 48 hours improved with Aleve. Takes gabapentin, applies triamcinolone ointment  2.Right eye dryness: dexamethasone eyedrops. 3.  Headaches: Brain MRI 11/07/2018: Unchanged from before.  3 mm enhancing nodule in the right lateral cerebellum which was determined to be scar tissue by brain tumor board. 4.  Hypothyroidism: Synthroid 112 mcg.  CT CAP: 12/06/2018: Brain MRI has been scheduled for August

## 2018-12-01 ENCOUNTER — Telehealth: Payer: Self-pay

## 2018-12-01 MED ORDER — METHYLPREDNISOLONE 4 MG PO TBPK: ORAL_TABLET | ORAL | 0 refills | Status: DC

## 2018-12-01 NOTE — Telephone Encounter (Signed)
Pt reports reaction to poison ivy.  Pt reports using OTC treatment with no effectiveness.   Per Dr. Lindi Adie OK for medrol dose pak.  RN notified pt, no further needs.  Pt will call if symptoms are not relieved.

## 2018-12-06 ENCOUNTER — Other Ambulatory Visit: Payer: Self-pay

## 2018-12-06 ENCOUNTER — Ambulatory Visit (HOSPITAL_COMMUNITY)
Admission: RE | Admit: 2018-12-06 | Discharge: 2018-12-06 | Disposition: A | Discharge status: Home / Self Care | Payer: BC Managed Care – PPO | Source: Ambulatory Visit | Attending: Hematology and Oncology | Admitting: Hematology and Oncology

## 2018-12-06 ENCOUNTER — Encounter (HOSPITAL_COMMUNITY): Payer: Self-pay

## 2018-12-06 DIAGNOSIS — Z171 Estrogen receptor negative status [ER-]: Secondary | ICD-10-CM | POA: Insufficient documentation

## 2018-12-06 DIAGNOSIS — C50511 Malignant neoplasm of lower-outer quadrant of right female breast: Secondary | ICD-10-CM | POA: Diagnosis not present

## 2018-12-06 MED ORDER — IOHEXOL 300 MG/ML  SOLN
100.0000 mL | Freq: Once | INTRAMUSCULAR | Status: AC | PRN
  Administered 2018-12-06: 08:00:00 100 mL via INTRAVENOUS

## 2018-12-06 MED ORDER — SODIUM CHLORIDE (PF) 0.9 % IJ SOLN
INTRAMUSCULAR | Status: AC
  Filled 2018-12-06: qty 50

## 2018-12-06 NOTE — Progress Notes (Signed)
Patient Care Team: [name], MD as PCP - General (Hematology and Oncology) [name], [name], [name], [name], [name], [name], NP (Inactive) as Nurse Practitioner (Nurse Practitioner)  DIAGNOSIS:    ICD-10-CM   1. 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, estrogen receptor negative ([place])  C50.511    Z17.1     SUMMARY OF ONCOLOGIC HISTORY: Oncology History  Breast cancer of lower-outer quadrant of right female breast ([place])  12/03/2014 Mammogram   Right breast mass 1.9 cm it o'clock position 8 cm depth from the nipple   12/03/2014 Initial Diagnosis   Right breast biopsy 8:00: Invasive ductal carcinoma, grade 3, ER 0%, PR 0%, Ki-67 90%, HER-2 negative ratio 1.43   12/10/2014 Breast MRI   Right breast lower outer quadrant: 2.3 x 2.4 x 2.4 cm rim-enhancing mass abuts the pectoralis fascia but no enhancement of pectoralis muscle, second focus of artifact?'s second tissue marker clip, no lymph nodes   12/10/2014 Clinical Stage   Stage IIA: T2 N0   12/24/2014 - 04/29/2015 Neo-Adjuvant Chemotherapy   Dose dense Adriamycin and Cytoxan 4 followed by weekly Abraxane 12   05/02/2015 Breast MRI   complete radiologic response   06/16/2015 Surgery   Left Lumpectomy: Complete path Response, 0/2 LN   06/16/2015 Pathologic Stage   ypT0 ypN0   07/23/2015 - 09/05/2015 Radiation Therapy   Adjuvant RT: 50.4 Gy in 28 fractions and a boost of 10 Gy in 5 fractions to total dose of 60.4 Gy   10/24/2015 Survivorship   SCP mailed to patient in lieu of in person visit.   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 in BARD 1, BRCA2 and NF1   12/31/2016 Miscellaneous   Everolimus 10 mg daily for cycle 1 if she cannot tolerate will decrease to 7.5 mg daily   05/24/2017 - 07/04/2017 Chemotherapy   Xeloda 2000 mg 2 weeks on 1 week off stopped due to progression of disease based on CT scans done 06/27/2017   07/25/2017 - 03/13/2018 Chemotherapy   [place] days 1 and 8 every 3 weeks stopped for progression    03/20/2018 - 03/31/2018 Radiation Therapy   Radiation to lymph nodes   04/03/2018 -  Chemotherapy   Keytruda every 3 weeks      CHIEF COMPLIANT: Follow-up of metastatic breast cancer to review recent scans and for Keytruda treatment   INTERVAL HISTORY: Kaitlyn Keith is a 53 y.o. with above-mentioned history of metastatic breast cancer who is currently receiving pembrolizumab immunotherapy every 3 weeks. CT CAP on 12/06/18 showed complete resolution of metastases in the inguinal/groin regions  and supraclavicular adenopathy and no active malignancy in the chest, abdomen, or pelvis. There was a 19m nonspecific lesion in the liver. She presents to the clinic today to review her CT scans and discuss further treatment.   REVIEW OF SYSTEMS:   Constitutional: Denies fevers, chills or abnormal weight loss Eyes: Denies blurriness of vision Ears, nose, mouth,  throat, and face: Denies mucositis or sore throat Respiratory: Denies cough, dyspnea or wheezes Cardiovascular: Denies palpitation, chest discomfort Gastrointestinal: Denies nausea, heartburn or change in bowel habits Skin: Denies abnormal skin rashes Lymphatics: Denies new lymphadenopathy or easy bruising Neurological: Denies numbness, tingling or new weaknesses Behavioral/Psych: Mood is stable, no new changes  Extremities: No lower extremity edema Breast: denies any pain or lumps or nodules in either breasts All other systems were reviewed with the patient and are negative.  I have reviewed the past medical history, past surgical history, social history and family history with the patient and they are unchanged from previous note.  ALLERGIES:  has No Known Allergies.  MEDICATIONS:  Current Outpatient Medications  Medication Sig Dispense Refill  . ALPRAZolam (XANAX) 2 MG tablet Take 1 tablet (2 mg total) by mouth at bedtime. 30 tablet 5  . betamethasone valerate ointment (VALISONE) 0.1 % Apply 1 application topically 2 (two) times daily. 30 g 1  . cyclobenzaprine (FLEXERIL) 5 MG tablet Take 1 tablet (5 mg total) by mouth 3 (three) times daily as needed for muscle spasms. 30 tablet 0  . dexamethasone (DECADRON) 0.1 % ophthalmic suspension Place 1 drop into the right eye every 8 (eight) hours as needed. 5 mL 0  . levothyroxine (SYNTHROID) 112 MCG tablet Take 1 tablet (112 mcg total) by mouth daily before breakfast. 30 tablet 3  . lidocaine-prilocaine (EMLA) cream APPLY TO AFFECTED AREA ONCE AS DIRECTED 30 g 3  . lisinopril-hydrochlorothiazide (PRINZIDE,ZESTORETIC) 20-12.5 MG tablet Take 1 tablet by mouth 2 (two) times daily. 180 tablet 3  . methylPREDNISolone (MEDROL DOSEPAK) 4 MG TBPK tablet Take as directed 21 tablet 0  . traMADol (ULTRAM) 50 MG tablet Take 1 tablet (50 mg total) by mouth every 12 (twelve) hours as needed for moderate pain. 30 tablet 0  . triamcinolone ointment (KENALOG)  0.5 % Apply 1 application topically 2 (two) times daily. 30 g 0   No current facility-administered medications for this visit.     PHYSICAL EXAMINATION: ECOG PERFORMANCE STATUS: 1 - Symptomatic but completely ambulatory  Vitals:   12/07/18 0912  BP: (!) 144/94  Pulse: 65  Resp: 18  Temp: (!) 97.3 F (36.3 C)  SpO2: 100%   Filed Weights   12/07/18 0912  Weight: 181 lb 4.8 oz (82.2 kg)    GENERAL: alert, no distress and comfortable SKIN: skin color, texture, turgor are normal, no rashes or significant lesions EYES: normal, Conjunctiva are pink and non-injected, sclera clear OROPHARYNX: no exudate, no erythema and lips, buccal mucosa, and tongue normal  NECK: supple, thyroid normal size, non-tender, without nodularity LYMPH: no palpable lymphadenopathy in the cervical, axillary or inguinal LUNGS: clear to auscultation and percussion with normal breathing effort HEART: regular rate & rhythm and no murmurs and no lower extremity edema ABDOMEN: abdomen soft, non-tender and normal bowel sounds MUSCULOSKELETAL: no cyanosis of digits and no clubbing  NEURO: alert & oriented x 3 with fluent speech, no focal motor/sensory deficits EXTREMITIES: No lower extremity edema  LABORATORY DATA:  I have reviewed the data as listed CMP Latest Ref Rng & Units 11/16/2018 10/26/2018 10/05/2018  Glucose  70 - 99 mg/dL 97 96 94  BUN 6 - 20 mg/dL 13 10 12  Creatinine 0.44 - 1.00 mg/dL 0.79 0.74 0.73  Sodium 135 - 145 mmol/L 140 137 138  Potassium 3.5 - 5.1 mmol/L 3.9 3.7 3.7  Chloride 98 - 111 mmol/L 107 104 104  CO2 22 - 32 mmol/L 26 24 26  Calcium 8.9 - 10.3 mg/dL 8.9 9.0 9.2  Total Protein 6.5 - 8.1 g/dL 7.0 7.6 7.5  Total Bilirubin 0.3 - 1.2 mg/dL 0.7 0.7 0.6  Alkaline Phos 38 - 126 U/L 85 94 97  AST 15 - 41 U/L 17 19 15  ALT 0 - 44 U/L 12 13 12    Lab Results  Component Value Date   WBC 3.9 (L) 12/07/2018   HGB 13.3 12/07/2018   HCT 41.0 12/07/2018   MCV 88.9 12/07/2018   PLT 176  12/07/2018   NEUTROABS 2.3 12/07/2018    ASSESSMENT & PLAN:  Breast cancer of lower-outer quadrant of right female breast ([facility]) Right breast biopsy 12/03/2014 8:00: Invasive ductal carcinoma, grade 3, ER 0%, PR 0%, 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 Neoadj chemo 12/24/14- 04/29/15 AC x 4 foll by Abraxane X 12 Rt Lumpectomy: Path CR 0/2 LN Adj XRT 07/23/15- 09/08/15 PET/CT scan 11/17/2016:Subcutaneous nodules in the neck, upper back, left arm, abdomen and pelvis Patient progressed on Xeloda January 2019-07/04/2017 stopped due to progression of disease Cerebellar mass diagnosed 07/12/2016: Resection followed by stereotactic radiation 07/29/2016 Lymph node from 03/02/2018 biopsy: PDL1+ ------------------------------------------------------------------------------------------------------------------------------------------------ Current treatment:Pembrolizumab given every 3 weeks starting 04/03/18, today cycle 13 Keytruda toxicities:Hypothyroidism for which she required thyroid replacement therapy Currently on 112 mcg of Synthroid. TSH level: 0.639 on 08/03/2018  CT CAP 06/12/2018: Interval decrease in the left supraclavicular lymph node and the metastatic lesion in the subcutaneous fat of the right groin region also decreased. No change in the 6 mm lesion in the right liver  Keytruda toxicities: 1.Severe scalp pain along the surgical scars very intense lasting for 48 hours improved with Aleve. Takes gabapentin, applies triamcinolone ointment  2.Right eye dryness: dexamethasone eyedrops. 3.  Headaches: Brain MRI 11/07/2018: Unchanged from before.  3 mm enhancing nodule in the right lateral cerebellum which was determined to be scar tissue by brain tumor board. 4.  Hypothyroidism: Synthroid 112 mcg.  CT CAP: 12/06/2018: Complete resolution of metastatic focus in the  right inguinal/groin region as well as complete resolution of the left supraclavicular lymphadenopathy.  Stable 6 mm hypodense lesion in the liver.  No current evidence of active malignancy.  Brain MRI has been scheduled for August  Return to clinic every 3 weeks for pembrolizumab.  No orders of the defined types were placed in this encounter.  The patient has a good understanding of the overall plan. she agrees with it. she will call with any problems that may develop before the next visit here.  Gudena, Vinay, MD 12/07/2018  I, Molly Dorshimer am acting as scribe for Dr. Vinay Gudena.  I have reviewed the above documentation for accuracy and completeness, and I agree with the above.       

## 2018-12-07 ENCOUNTER — Other Ambulatory Visit: Payer: Self-pay | Admitting: Hematology and Oncology

## 2018-12-07 ENCOUNTER — Inpatient Hospital Stay: Payer: BC Managed Care – PPO

## 2018-12-07 ENCOUNTER — Encounter: Payer: Self-pay | Admitting: *Deleted

## 2018-12-07 ENCOUNTER — Inpatient Hospital Stay (HOSPITAL_BASED_OUTPATIENT_CLINIC_OR_DEPARTMENT_OTHER): Payer: BC Managed Care – PPO | Admitting: Hematology and Oncology

## 2018-12-07 ENCOUNTER — Inpatient Hospital Stay: Payer: BC Managed Care – PPO | Attending: Hematology and Oncology

## 2018-12-07 ENCOUNTER — Other Ambulatory Visit: Payer: Self-pay

## 2018-12-07 DIAGNOSIS — C792 Secondary malignant neoplasm of skin: Secondary | ICD-10-CM

## 2018-12-07 DIAGNOSIS — E039 Hypothyroidism, unspecified: Secondary | ICD-10-CM | POA: Diagnosis not present

## 2018-12-07 DIAGNOSIS — C7931 Secondary malignant neoplasm of brain: Secondary | ICD-10-CM | POA: Diagnosis not present

## 2018-12-07 DIAGNOSIS — C50511 Malignant neoplasm of lower-outer quadrant of right female breast: Secondary | ICD-10-CM

## 2018-12-07 DIAGNOSIS — Z5112 Encounter for antineoplastic immunotherapy: Secondary | ICD-10-CM | POA: Insufficient documentation

## 2018-12-07 DIAGNOSIS — Z171 Estrogen receptor negative status [ER-]: Secondary | ICD-10-CM | POA: Insufficient documentation

## 2018-12-07 LAB — CMP (CANCER CENTER ONLY)
ALT: 10 U/L (ref 0–44)
AST: 16 U/L (ref 15–41)
Albumin: 3.6 g/dL (ref 3.5–5.0)
Alkaline Phosphatase: 89 U/L (ref 38–126)
Anion gap: 8 (ref 5–15)
BUN: 15 mg/dL (ref 6–20)
CO2: 26 mmol/L (ref 22–32)
Calcium: 8.8 mg/dL — ABNORMAL LOW (ref 8.9–10.3)
Chloride: 106 mmol/L (ref 98–111)
Creatinine: 0.74 mg/dL (ref 0.44–1.00)
GFR, Est AFR Am: >60 mL/min (ref >60)
GFR, Estimated: >60 mL/min (ref >60)
Glucose, Bld: 94 mg/dL (ref 70–99)
Potassium: 3.8 mmol/L (ref 3.5–5.1)
Sodium: 140 mmol/L (ref 135–145)
Total Bilirubin: 0.4 mg/dL (ref 0.3–1.2)
Total Protein: 6.9 g/dL (ref 6.5–8.1)

## 2018-12-07 LAB — CBC WITH DIFFERENTIAL (CANCER CENTER ONLY)
Abs Immature Granulocytes: 0 10*3/uL (ref 0.00–0.07)
Basophils Absolute: 0 10*3/uL (ref 0.0–0.1)
Basophils Relative: 1 %
Eosinophils Absolute: 0.3 10*3/uL (ref 0.0–0.5)
Eosinophils Relative: 7 %
HCT: 41 % (ref 36.0–46.0)
Hemoglobin: 13.3 g/dL (ref 12.0–15.0)
Immature Granulocytes: 0 %
Lymphocytes Relative: 26 %
Lymphs Abs: 1 10*3/uL (ref 0.7–4.0)
MCH: 28.9 pg (ref 26.0–34.0)
MCHC: 32.4 g/dL (ref 30.0–36.0)
MCV: 88.9 fL (ref 80.0–100.0)
Monocytes Absolute: 0.3 10*3/uL (ref 0.1–1.0)
Monocytes Relative: 8 %
Neutro Abs: 2.3 10*3/uL (ref 1.7–7.7)
Neutrophils Relative %: 58 %
Platelet Count: 176 10*3/uL (ref 150–400)
RBC: 4.61 MIL/uL (ref 3.87–5.11)
RDW: 12.6 % (ref 11.5–15.5)
WBC Count: 3.9 10*3/uL — ABNORMAL LOW (ref 4.0–10.5)
nRBC: 0 % (ref 0.0–0.2)

## 2018-12-07 LAB — TSH: TSH: 4.541 u[IU]/mL — ABNORMAL HIGH (ref 0.308–3.960)

## 2018-12-07 MED ORDER — SODIUM CHLORIDE 0.9 % IV SOLN
200.0000 mg | Freq: Once | INTRAVENOUS | Status: AC
  Administered 2018-12-07: 11:00:00 200 mg via INTRAVENOUS
  Filled 2018-12-07: qty 8

## 2018-12-07 MED ORDER — SODIUM CHLORIDE 0.9 % IV SOLN
Freq: Once | INTRAVENOUS | Status: AC
  Administered 2018-12-07: 10:00:00 via INTRAVENOUS
  Filled 2018-12-07: qty 250

## 2018-12-07 MED ORDER — SODIUM CHLORIDE 0.9% FLUSH
10.0000 mL | INTRAVENOUS | Status: DC | PRN
  Administered 2018-12-07: 10 mL
  Filled 2018-12-07: qty 10

## 2018-12-07 MED ORDER — HEPARIN SOD (PORK) LOCK FLUSH 100 UNIT/ML IV SOLN
500.0000 [IU] | Freq: Once | INTRAVENOUS | Status: AC | PRN
  Administered 2018-12-07: 500 [IU]
  Filled 2018-12-07: qty 5

## 2018-12-07 NOTE — Patient Instructions (Signed)
[facility] Discharge Instructions for Patients Receiving Chemotherapy  Today you received the following chemotherapy agents: Pembrolizumab (Keytruda)  To help prevent nausea and vomiting after your treatment, we encourage you to take your nausea medication as directed.   If you develop nausea and vomiting that is not controlled by your nausea medication, call the clinic.   BELOW ARE SYMPTOMS THAT SHOULD BE REPORTED IMMEDIATELY:  *FEVER GREATER THAN 100.5 F  *CHILLS WITH OR WITHOUT FEVER  NAUSEA AND VOMITING THAT IS NOT CONTROLLED WITH YOUR NAUSEA MEDICATION  *UNUSUAL SHORTNESS OF BREATH  *UNUSUAL BRUISING OR BLEEDING  TENDERNESS IN MOUTH AND THROAT WITH OR WITHOUT PRESENCE OF ULCERS  *URINARY PROBLEMS  *BOWEL PROBLEMS  UNUSUAL RASH Items with * indicate a potential emergency and should be followed up as soon as possible.  Feel free to call the clinic should you have any questions or concerns. The clinic phone number is [phone].  Please show the CHEMO ALERT CARD at check-in to the Emergency Department and triage nurse.  Coronavirus (COVID-19) Are you at risk?  Are you at risk for the Coronavirus (COVID-19)?  To be considered HIGH RISK for Coronavirus (COVID-19), you have to meet the following criteria:  . Traveled to [place], [place], [place], [place] or [place]; or in the [place] to [place], [place], [place], or [place]; and have fever, cough, and shortness of breath within the last 2 weeks of travel OR . Been in close contact with a person diagnosed with COVID-19 within the last 2 weeks and have fever, cough, and shortness of breath . IF YOU DO NOT MEET THESE CRITERIA, YOU ARE CONSIDERED LOW RISK FOR COVID-19.  What to do if you are HIGH RISK for COVID-19?  . If you are having a medical emergency, call 911. . Seek medical care right away. Before you go to a doctor's office, urgent care or emergency department, call ahead and tell them  about your recent travel, contact with someone diagnosed with COVID-19, and your symptoms. You should receive instructions from your physician's office regarding next steps of care.  . When you arrive at healthcare provider, tell the healthcare staff immediately you have returned from visiting [place], [place], [place], [place] or [place]; or traveled in the [place] to [place], [place], [place], or [place]; in the last two weeks or you have been in close contact with a person diagnosed with COVID-19 in the last 2 weeks.   . Tell the health care staff about your symptoms: fever, cough and shortness of breath. . After you have been seen by a medical provider, you will be either: o Tested for (COVID-19) and discharged home on quarantine except to seek medical care if symptoms worsen, and asked to  - Stay home and avoid contact with others until you get your results (4-5 days)  - Avoid travel on public transportation if possible (such as bus, train, or airplane) or o Sent to the Emergency Department by EMS for evaluation, COVID-19 testing, and possible admission depending on your condition and test results.  What to do if you are LOW RISK for COVID-19?  Reduce your risk of any infection by using the same precautions used for avoiding the common cold or flu:  . Wash your hands often with soap and warm water for at least 20 seconds.  If soap and water are not readily available, use an alcohol-based hand sanitizer with at least 60% alcohol.  . If coughing or   sneezing, cover your mouth and nose by coughing or sneezing into the elbow areas of your shirt or coat, into a tissue or into your sleeve (not your hands). . Avoid shaking hands with others and consider head nods or verbal greetings only. . Avoid touching your eyes, nose, or mouth with unwashed hands.  . Avoid close contact with people who are sick. . Avoid places or events with large numbers of people in one location, like concerts or  sporting events. . Carefully consider travel plans you have or are making. . If you are planning any travel outside or inside the [place], visit the CDC's Travelers' Health webpage for the latest health notices. . If you have some symptoms but not all symptoms, continue to monitor at home and seek medical attention if your symptoms worsen. . If you are having a medical emergency, call 911.   ADDITIONAL HEALTHCARE OPTIONS FOR PATIENTS  [facility] / e-Visit: [email or web address]         [facility]: [phone]  [facility]: [phone]                   [facility]: [phone]   

## 2018-12-08 ENCOUNTER — Telehealth: Payer: Self-pay | Admitting: Hematology and Oncology

## 2018-12-08 NOTE — Telephone Encounter (Signed)
I could not leave a message mailbox was full

## 2018-12-21 NOTE — Assessment & Plan Note (Signed)
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 Neoadj chemo 12/24/14- 04/29/15 AC x 4 foll by Abraxane X 12 Rt Lumpectomy: Path CR 0/2 LN Adj XRT 07/23/15- 09/08/15 PET/CT scan 11/17/2016:Subcutaneous nodules in the neck, upper back, left arm, abdomen and pelvis Patient progressed on Xeloda January 2019-07/04/2017 stopped due to progression of disease Cerebellar mass diagnosed 07/12/2016: Resection followed by stereotactic radiation 07/29/2016 Lymph node from 03/02/2018 biopsy: PDL1+ ------------------------------------------------------------------------------------------------------------------------------------------------ Current treatment:Pembrolizumab given every 3 weeks starting 04/03/18, today cycle 14 Keytruda toxicities:Hypothyroidism for which she required thyroid replacement therapy Currently on 112 mcg of Synthroid. TSH level: 0.639 on 08/03/2018  CT CAP 06/12/2018: Interval decrease in the left supraclavicular lymph node and the metastatic lesion in the subcutaneous fat of the right groin region also decreased. No change in the 6 mm lesion in the right liver  Keytruda toxicities: 1.Severe scalp pain along the surgical scars very intense lasting for 48 hours improved with Aleve.Takes gabapentin, applies triamcinolone ointment 2.Right eye dryness: dexamethasone eyedrops. 3.Headaches: Brain MRI 11/07/2018: Unchanged from before. 3 mm enhancing nodule in the right lateral cerebellum which was determined to be scar tissue by brain tumor board. 4.Hypothyroidism: Synthroid 112 mcg.  CT CAP: 12/06/2018: Complete resolution of metastatic focus in the right inguinal/groin region as well as complete resolution of the left supraclavicular lymphadenopathy.  Stable 6 mm hypodense lesion in  the liver.  No current evidence of active malignancy.  Brain MRI has been scheduled for August

## 2018-12-27 NOTE — Progress Notes (Signed)
Patient Care Team: [name], MD as PCP - General (Hematology and Oncology) [name], [name], [name], [name], [name], [name], NP (Inactive) as Nurse Practitioner (Nurse Practitioner)  DIAGNOSIS:    ICD-10-CM   1. 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%, HER-2 negative ratio 1.43   12/10/2014 Breast MRI   Right breast lower outer quadrant: 2.3 x 2.4 x 2.4 cm rim-enhancing mass abuts the pectoralis fascia but no enhancement of pectoralis muscle, second focus of artifact?'s second tissue marker clip, no lymph nodes   12/10/2014 Clinical Stage   Stage IIA: T2 N0   12/24/2014 - 04/29/2015 Neo-Adjuvant Chemotherapy   Dose dense Adriamycin and Cytoxan 4 followed by weekly Abraxane 12   05/02/2015 Breast MRI   complete radiologic response   06/16/2015 Surgery   Left Lumpectomy: Complete path Response, 0/2 LN   06/16/2015 Pathologic Stage   ypT0 ypN0   07/23/2015 - 09/05/2015 Radiation Therapy   Adjuvant RT: 50.4 Gy in 28 fractions and a boost of 10 Gy in 5 fractions to total dose of 60.4 Gy   10/24/2015 Survivorship   SCP mailed to patient in lieu of in person visit.   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 in BARD 1, BRCA2 and NF1   12/31/2016 Miscellaneous   Everolimus 10 mg daily for cycle 1 if she cannot tolerate will decrease to 7.5 mg daily   05/24/2017 - 07/04/2017 Chemotherapy   Xeloda 2000 mg 2 weeks on 1 week off stopped due to progression of disease based on CT scans done 06/27/2017   07/25/2017 - 03/13/2018 Chemotherapy   [place] days 1 and 8 every 3 weeks stopped for progression    03/20/2018 - 03/31/2018 Radiation Therapy   Radiation to lymph nodes   04/03/2018 -  Chemotherapy   Keytruda every 3 weeks      CHIEF COMPLIANT: Follow-up of metastatic breast cancer on Keytruda   INTERVAL HISTORY: Kaitlyn Keith is a 53 y.o. with above-mentioned history of metastatic breast cancer who is currently receiving pembrolizumab immunotherapy every 3 weeks. She presents to the clinic today for treatment.  She complains of intermittent mild headaches.  She also has  a mild discomfort radiating from the left hip down the leg.  She had a wisdom tooth removal surgery which was pretty bad and that she is still hurting from that.  REVIEW OF SYSTEMS:   Constitutional: Denies fevers, chills or abnormal weight loss Eyes: Denies blurriness of vision Ears, nose, mouth, throat, and face: Denies mucositis or sore throat Respiratory: Denies cough, dyspnea  or wheezes Cardiovascular: Denies palpitation, chest discomfort Gastrointestinal: Denies nausea, heartburn or change in bowel habits Skin: Denies abnormal skin rashes Lymphatics: Denies new lymphadenopathy or easy bruising Neurological: Denies numbness, tingling or new weaknesses Behavioral/Psych: Mood is stable, no new changes  Extremities: No lower extremity edema Breast: denies any pain or lumps or nodules in either breasts All other systems were reviewed with the patient and are negative.  I have reviewed the past medical history, past surgical history, social history and family history with the patient and they are unchanged from previous note.  ALLERGIES:  has No Known Allergies.  MEDICATIONS:  Current Outpatient Medications  Medication Sig Dispense Refill  . ALPRAZolam (XANAX) 2 MG tablet Take 1 tablet (2 mg total) by mouth at bedtime. 30 tablet 5  . betamethasone valerate ointment (VALISONE) 0.1 % Apply 1 application topically 2 (two) times daily. 30 g 1  . cyclobenzaprine (FLEXERIL) 5 MG tablet Take 1 tablet (5 mg total) by mouth 3 (three) times daily as needed for muscle spasms. 30 tablet 0  . dexamethasone (DECADRON) 0.1 % ophthalmic suspension Place 1 drop into the right eye every 8 (eight) hours as needed. 5 mL 0  . levothyroxine (SYNTHROID) 112 MCG tablet Take 1 tablet (112 mcg total) by mouth daily before breakfast. 30 tablet 3  . lidocaine-prilocaine (EMLA) cream APPLY TO AFFECTED AREA ONCE AS DIRECTED 30 g 3  . lisinopril-hydrochlorothiazide (PRINZIDE,ZESTORETIC) 20-12.5 MG tablet Take 1 tablet by mouth 2 (two) times daily. 180 tablet 3  . methylPREDNISolone (MEDROL DOSEPAK) 4 MG TBPK tablet Take as directed 21 tablet 0  . traMADol (ULTRAM) 50 MG tablet Take 1 tablet (50 mg total) by mouth every 12 (twelve) hours as needed for moderate pain. 30 tablet 0  . triamcinolone ointment (KENALOG) 0.5 % Apply 1 application topically 2 (two) times daily. 30 g 0   No current  facility-administered medications for this visit.     PHYSICAL EXAMINATION: ECOG PERFORMANCE STATUS: 1 - Symptomatic but completely ambulatory  Vitals:   12/28/18 1439  BP: 135/87  Pulse: (!) 57  Resp: 17  Temp: 98.7 F (37.1 C)  SpO2: 100%   Filed Weights   12/28/18 1439  Weight: 181 lb 14.4 oz (82.5 kg)    GENERAL: alert, no distress and comfortable SKIN: skin color, texture, turgor are normal, no rashes or significant lesions EYES: normal, Conjunctiva are pink and non-injected, sclera clear OROPHARYNX: no exudate, no erythema and lips, buccal mucosa, and tongue normal  NECK: supple, thyroid normal size, non-tender, without nodularity LYMPH: no palpable lymphadenopathy in the cervical, axillary or inguinal LUNGS: clear to auscultation and percussion with normal breathing effort HEART: regular rate & rhythm and no murmurs and no lower extremity edema ABDOMEN: abdomen soft, non-tender and normal bowel sounds MUSCULOSKELETAL: no cyanosis of digits and no clubbing  NEURO: alert & oriented x 3 with fluent speech, no focal motor/sensory deficits EXTREMITIES: No lower extremity edema  LABORATORY DATA:  I have reviewed the data as listed CMP Latest Ref Rng & Units 12/07/2018 11/16/2018 10/26/2018  Glucose 70 - 99 mg/dL 94 97 96  BUN 6 - 20 mg/dL '15 13 10  ' Creatinine 0.44 - 1.00 mg/dL 0.74 0.79 0.74  Sodium 135 - 145 mmol/L 140 140 137  Potassium 3.5 - 5.1 mmol/L 3.8 3.9 3.7  Chloride 98 - 111 mmol/L 106 107 104  CO2 22 - 32 mmol/L '26 26 24  ' Calcium 8.9 - 10.3 mg/dL 8.8(L) 8.9 9.0  Total Protein 6.5 - 8.1 g/dL 6.9 7.0 7.6  Total Bilirubin 0.3 - 1.2 mg/dL 0.4 0.7 0.7  Alkaline Phos 38 - 126 U/L 89 85 94  AST 15 - 41 U/L '16 17 19  ' ALT 0 - 44 U/L '10 12 13    ' Lab Results  Component Value Date   WBC 4.9 12/28/2018   HGB 13.1 12/28/2018   HCT 40.7 12/28/2018   MCV 89.1 12/28/2018   PLT 190 12/28/2018   NEUTROABS 3.1 12/28/2018    ASSESSMENT & PLAN:  Breast cancer of  lower-outer quadrant of right female breast ([place]) Right breast biopsy 12/03/2014 8:00: Invasive ductal carcinoma, grade 3, ER 0%, PR 0%, 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 Neoadj chemo 12/24/14- 04/29/15 AC x 4 foll by Abraxane X 12 Rt Lumpectomy: Path CR 0/2 LN Adj XRT 07/23/15- 09/08/15 PET/CT scan 11/17/2016:Subcutaneous nodules in the neck, upper back, left arm, abdomen and pelvis Patient progressed on Xeloda January 2019-07/04/2017 stopped due to progression of disease Cerebellar mass diagnosed 07/12/2016: Resection followed by stereotactic radiation 07/29/2016 Lymph node from 03/02/2018 biopsy: PDL1+ ------------------------------------------------------------------------------------------------------------------------------------------------ Current treatment:Pembrolizumab given every 3 weeks starting 04/03/18, today cycle 14 Keytruda toxicities:Hypothyroidism for which she required thyroid replacement therapy Currently on 112 mcg of Synthroid. TSH level: 0.639 on 08/03/2018  CT CAP 06/12/2018: Interval decrease in the left supraclavicular lymph node and the metastatic lesion in the subcutaneous fat of the right groin region also decreased. No change in the 6 mm lesion in the right liver  Keytruda toxicities: 1.Severe scalp pain along the surgical scars very intense lasting for 48 hours improved with Aleve.Takes gabapentin, applies triamcinolone ointment 2.Right eye dryness: dexamethasone eyedrops. 3.Headaches: Brain MRI 11/07/2018: Unchanged from before. 3 mm enhancing nodule in the right lateral cerebellum which was determined to be scar tissue by brain tumor board. 4.Hypothyroidism: Synthroid 112 mcg.  CT CAP: 12/06/2018: Complete resolution of metastatic focus in the right inguinal/groin region as well as complete resolution of the left supraclavicular  lymphadenopathy.  Stable 6 mm hypodense lesion in the liver.  No current evidence of active malignancy.  Brain MRI has been scheduled for August   No orders of the defined types were placed in this encounter.  The patient has a good understanding of the overall plan. she agrees with it. she will call with any problems that may develop before the next visit here.  [name], MD 12/28/2018  Julious Oka Dorshimer am acting as scribe for Dr. [name].  I have reviewed the above documentation for accuracy and completeness, and I agree with the above.

## 2018-12-28 ENCOUNTER — Inpatient Hospital Stay: Payer: BC Managed Care – PPO | Attending: Hematology and Oncology

## 2018-12-28 ENCOUNTER — Inpatient Hospital Stay (HOSPITAL_BASED_OUTPATIENT_CLINIC_OR_DEPARTMENT_OTHER): Payer: BC Managed Care – PPO | Admitting: Hematology and Oncology

## 2018-12-28 ENCOUNTER — Inpatient Hospital Stay: Payer: BC Managed Care – PPO

## 2018-12-28 ENCOUNTER — Other Ambulatory Visit: Payer: Self-pay

## 2018-12-28 DIAGNOSIS — E039 Hypothyroidism, unspecified: Secondary | ICD-10-CM | POA: Insufficient documentation

## 2018-12-28 DIAGNOSIS — Z5112 Encounter for antineoplastic immunotherapy: Secondary | ICD-10-CM | POA: Insufficient documentation

## 2018-12-28 DIAGNOSIS — C50511 Malignant neoplasm of lower-outer quadrant of right female breast: Secondary | ICD-10-CM

## 2018-12-28 DIAGNOSIS — R51 Headache: Secondary | ICD-10-CM | POA: Insufficient documentation

## 2018-12-28 DIAGNOSIS — Z923 Personal history of irradiation: Secondary | ICD-10-CM | POA: Diagnosis not present

## 2018-12-28 DIAGNOSIS — Z79899 Other long term (current) drug therapy: Secondary | ICD-10-CM | POA: Insufficient documentation

## 2018-12-28 DIAGNOSIS — Z171 Estrogen receptor negative status [ER-]: Secondary | ICD-10-CM

## 2018-12-28 DIAGNOSIS — Z9221 Personal history of antineoplastic chemotherapy: Secondary | ICD-10-CM | POA: Diagnosis not present

## 2018-12-28 DIAGNOSIS — C7931 Secondary malignant neoplasm of brain: Secondary | ICD-10-CM | POA: Diagnosis present

## 2018-12-28 DIAGNOSIS — C792 Secondary malignant neoplasm of skin: Secondary | ICD-10-CM | POA: Insufficient documentation

## 2018-12-28 LAB — CBC WITH DIFFERENTIAL (CANCER CENTER ONLY)
Abs Immature Granulocytes: 0.02 10*3/uL (ref 0.00–0.07)
Basophils Absolute: 0 10*3/uL (ref 0.0–0.1)
Basophils Relative: 1 %
Eosinophils Absolute: 0.2 10*3/uL (ref 0.0–0.5)
Eosinophils Relative: 4 %
HCT: 40.7 % (ref 36.0–46.0)
Hemoglobin: 13.1 g/dL (ref 12.0–15.0)
Immature Granulocytes: 0 %
Lymphocytes Relative: 25 %
Lymphs Abs: 1.2 10*3/uL (ref 0.7–4.0)
MCH: 28.7 pg (ref 26.0–34.0)
MCHC: 32.2 g/dL (ref 30.0–36.0)
MCV: 89.1 fL (ref 80.0–100.0)
Monocytes Absolute: 0.4 10*3/uL (ref 0.1–1.0)
Monocytes Relative: 8 %
Neutro Abs: 3.1 10*3/uL (ref 1.7–7.7)
Neutrophils Relative %: 62 %
Platelet Count: 190 10*3/uL (ref 150–400)
RBC: 4.57 MIL/uL (ref 3.87–5.11)
RDW: 13 % (ref 11.5–15.5)
WBC Count: 4.9 10*3/uL (ref 4.0–10.5)
nRBC: 0 % (ref 0.0–0.2)

## 2018-12-28 LAB — CMP (CANCER CENTER ONLY)
ALT: 13 U/L (ref 0–44)
AST: 17 U/L (ref 15–41)
Albumin: 3.6 g/dL (ref 3.5–5.0)
Alkaline Phosphatase: 82 U/L (ref 38–126)
Anion gap: 11 (ref 5–15)
BUN: 16 mg/dL (ref 6–20)
CO2: 23 mmol/L (ref 22–32)
Calcium: 8.7 mg/dL — ABNORMAL LOW (ref 8.9–10.3)
Chloride: 106 mmol/L (ref 98–111)
Creatinine: 0.75 mg/dL (ref 0.44–1.00)
GFR, Est AFR Am: >60 mL/min (ref >60)
GFR, Estimated: >60 mL/min (ref >60)
Glucose, Bld: 90 mg/dL (ref 70–99)
Potassium: 3.6 mmol/L (ref 3.5–5.1)
Sodium: 140 mmol/L (ref 135–145)
Total Bilirubin: 0.3 mg/dL (ref 0.3–1.2)
Total Protein: 6.9 g/dL (ref 6.5–8.1)

## 2018-12-28 LAB — TSH: TSH: 16.416 u[IU]/mL — ABNORMAL HIGH (ref 0.308–3.960)

## 2018-12-28 MED ORDER — SODIUM CHLORIDE 0.9% FLUSH
10.0000 mL | INTRAVENOUS | Status: DC | PRN
  Administered 2018-12-28: 10 mL
  Filled 2018-12-28: qty 10

## 2018-12-28 MED ORDER — HEPARIN SOD (PORK) LOCK FLUSH 100 UNIT/ML IV SOLN
500.0000 [IU] | Freq: Once | INTRAVENOUS | Status: AC | PRN
  Administered 2018-12-28: 500 [IU]
  Filled 2018-12-28: qty 5

## 2018-12-28 MED ORDER — SODIUM CHLORIDE 0.9 % IV SOLN
Freq: Once | INTRAVENOUS | Status: AC
  Administered 2018-12-28: 15:00:00 via INTRAVENOUS
  Filled 2018-12-28: qty 250

## 2018-12-28 MED ORDER — SODIUM CHLORIDE 0.9 % IV SOLN
200.0000 mg | Freq: Once | INTRAVENOUS | Status: AC
  Administered 2018-12-28: 200 mg via INTRAVENOUS
  Filled 2018-12-28: qty 8

## 2018-12-28 NOTE — Patient Instructions (Signed)
[facility] Discharge Instructions for Patients Receiving Chemotherapy  Today you received the following Immunotherapy agent: Keytruda  To help prevent nausea and vomiting after your treatment, we encourage you to take your nausea medication as directed by your MD.   If you develop nausea and vomiting that is not controlled by your nausea medication, call the clinic.   BELOW ARE SYMPTOMS THAT SHOULD BE REPORTED IMMEDIATELY:  *FEVER GREATER THAN 100.5 F  *CHILLS WITH OR WITHOUT FEVER  NAUSEA AND VOMITING THAT IS NOT CONTROLLED WITH YOUR NAUSEA MEDICATION  *UNUSUAL SHORTNESS OF BREATH  *UNUSUAL BRUISING OR BLEEDING  TENDERNESS IN MOUTH AND THROAT WITH OR WITHOUT PRESENCE OF ULCERS  *URINARY PROBLEMS  *BOWEL PROBLEMS  UNUSUAL RASH Items with * indicate a potential emergency and should be followed up as soon as possible.  Feel free to call the clinic should you have any questions or concerns. The clinic phone number is [phone].  Please show the CHEMO ALERT CARD at check-in to the Emergency Department and triage nurse.  Coronavirus (COVID-19) Are you at risk?  Are you at risk for the Coronavirus (COVID-19)?  To be considered HIGH RISK for Coronavirus (COVID-19), you have to meet the following criteria:  . Traveled to [place], [place], [place], [place] or [place]; or in the [place] to [place], [place], [place], or [place]; and have fever, cough, and shortness of breath within the last 2 weeks of travel OR . Been in close contact with a person diagnosed with COVID-19 within the last 2 weeks and have fever, cough, and shortness of breath . IF YOU DO NOT MEET THESE CRITERIA, YOU ARE CONSIDERED LOW RISK FOR COVID-19.  What to do if you are HIGH RISK for COVID-19?  . If you are having a medical emergency, call 911. . Seek medical care right away. Before you go to a doctor's office, urgent care or emergency department, call ahead and tell them about  your recent travel, contact with someone diagnosed with COVID-19, and your symptoms. You should receive instructions from your physician's office regarding next steps of care.  . When you arrive at healthcare provider, tell the healthcare staff immediately you have returned from visiting [place], [place], [place], [place] or [place]; or traveled in the [place] to [place], [place], [place], or [place]; in the last two weeks or you have been in close contact with a person diagnosed with COVID-19 in the last 2 weeks.   . Tell the health care staff about your symptoms: fever, cough and shortness of breath. . After you have been seen by a medical provider, you will be either: o Tested for (COVID-19) and discharged home on quarantine except to seek medical care if symptoms worsen, and asked to  - Stay home and avoid contact with others until you get your results (4-5 days)  - Avoid travel on public transportation if possible (such as bus, train, or airplane) or o Sent to the Emergency Department by EMS for evaluation, COVID-19 testing, and possible admission depending on your condition and test results.  What to do if you are LOW RISK for COVID-19?  Reduce your risk of any infection by using the same precautions used for avoiding the common cold or flu:  . Wash your hands often with soap and warm water for at least 20 seconds.  If soap and water are not readily available, use an alcohol-based hand sanitizer with at least 60% alcohol.  . If   coughing or sneezing, cover your mouth and nose by coughing or sneezing into the elbow areas of your shirt or coat, into a tissue or into your sleeve (not your hands). . Avoid shaking hands with others and consider head nods or verbal greetings only. . Avoid touching your eyes, nose, or mouth with unwashed hands.  . Avoid close contact with people who are sick. . Avoid places or events with large numbers of people in one location, like concerts or sporting  events. . Carefully consider travel plans you have or are making. . If you are planning any travel outside or inside the [place], visit the CDC's Travelers' Health webpage for the latest health notices. . If you have some symptoms but not all symptoms, continue to monitor at home and seek medical attention if your symptoms worsen. . If you are having a medical emergency, call 911.   ADDITIONAL HEALTHCARE OPTIONS FOR PATIENTS  [facility] / e-Visit: [email or web address]         [facility]: [phone]  [facility]: [phone]                   [facility]: [phone]    

## 2018-12-29 ENCOUNTER — Other Ambulatory Visit: Payer: Self-pay | Admitting: Hematology and Oncology

## 2018-12-29 MED ORDER — LEVOTHYROXINE SODIUM 125 MCG PO TABS: 125.0000 ug | ORAL_TABLET | Freq: Every day | ORAL | 0 refills | Status: DC

## 2019-01-03 ENCOUNTER — Other Ambulatory Visit: Payer: Self-pay | Admitting: Hematology and Oncology

## 2019-01-08 ENCOUNTER — Other Ambulatory Visit: Payer: Self-pay

## 2019-01-08 MED ORDER — LEVOTHYROXINE SODIUM 125 MCG PO TABS: ORAL_TABLET | ORAL | 2 refills | Status: DC

## 2019-01-09 ENCOUNTER — Other Ambulatory Visit: Payer: Self-pay | Admitting: Radiation Therapy

## 2019-01-11 NOTE — Assessment & Plan Note (Signed)
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 Neoadj chemo 12/24/14- 04/29/15 AC x 4 foll by Abraxane X 12 Rt Lumpectomy: Path CR 0/2 LN Adj XRT 07/23/15- 09/08/15 PET/CT scan 11/17/2016:Subcutaneous nodules in the neck, upper back, left arm, abdomen and pelvis Patient progressed on Xeloda January 2019-07/04/2017 stopped due to progression of disease Cerebellar mass diagnosed 07/12/2016: Resection followed by stereotactic radiation 07/29/2016 Lymph node from 03/02/2018 biopsy: PDL1+ ------------------------------------------------------------------------------------------------------------------------------------------------ Current treatment:Pembrolizumab given every 3 weeks starting 04/03/18, today cycle 15 Keytruda toxicities:Hypothyroidism for which she required thyroid replacement therapy Currently on 112 mcg of Synthroid. TSH level: 0.639 on 08/03/2018  Keytruda toxicities: 1.Severe scalp pain along the surgical scars very intense lasting for 48 hours improved with Aleve.Takes gabapentin, applies triamcinolone ointment 2.Right eye dryness: dexamethasone eyedrops. 3.Headaches: Brain MRI 11/07/2018: Unchanged from before. 3 mm enhancing nodule in the right lateral cerebellum which was determined to be scar tissue by brain tumor board. 4.Hypothyroidism:Synthroid 112 mcg.  CT CAP: 12/06/2018:Complete resolution of metastatic focus in the right inguinal/groin region as well as complete resolution of the left supraclavicular lymphadenopathy. Stable 6 mm hypodense lesion in the liver. No current evidence of active malignancy.  Brain MRI 01/13/2019:  Return to clinic every 3 weeks for pembrolizumab

## 2019-01-13 ENCOUNTER — Other Ambulatory Visit: Payer: BLUE CROSS/BLUE SHIELD

## 2019-01-13 ENCOUNTER — Ambulatory Visit
Admission: RE | Admit: 2019-01-13 | Discharge: 2019-01-13 | Disposition: A | Discharge status: Home / Self Care | Payer: BLUE CROSS/BLUE SHIELD | Source: Ambulatory Visit | Attending: Radiation Oncology | Admitting: Radiation Oncology

## 2019-01-13 DIAGNOSIS — C7931 Secondary malignant neoplasm of brain: Secondary | ICD-10-CM

## 2019-01-13 MED ORDER — GADOBENATE DIMEGLUMINE 529 MG/ML IV SOLN
15.0000 mL | Freq: Once | INTRAVENOUS | Status: AC | PRN
  Administered 2019-01-13: 15 mL via INTRAVENOUS

## 2019-01-17 ENCOUNTER — Ambulatory Visit
Admission: RE | Admit: 2019-01-17 | Discharge: 2019-01-17 | Disposition: A | Discharge status: Home / Self Care | Payer: BC Managed Care – PPO | Source: Ambulatory Visit | Attending: Urology | Admitting: Urology

## 2019-01-17 ENCOUNTER — Encounter: Payer: Self-pay | Admitting: Urology

## 2019-01-17 ENCOUNTER — Inpatient Hospital Stay: Payer: BC Managed Care – PPO

## 2019-01-17 ENCOUNTER — Other Ambulatory Visit: Payer: Self-pay

## 2019-01-17 DIAGNOSIS — C7931 Secondary malignant neoplasm of brain: Secondary | ICD-10-CM

## 2019-01-17 NOTE — Progress Notes (Signed)
Radiation Oncology         [phone] ________________________________  Name: Kaitlyn Keith MRN: [id number]  Date: 01/17/2019  DOB: 04-08-1966  Follow-Up Visit Note  CC: [name], MD  Ditty, Kevan Ny, *  Diagnosis:    53 y.o. 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        ICD-10-CM   1. 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 Pelvis / 30 Gy in 10 fractions (right inguinal nodes) 2. Left Supraclavicular / 30 Gy in 10 fractions 3. Abdomen / 20 Gy in 8 fractions (subcutaneous Met)  07/11/2017 - 07/22/2017 palliative XRT for painful subcutaneous nodules ([place]) 1. Left Flank / 30 Gy in 10 fractions 2. Left Groin / 30 Gy in 10 fractions  07/26/16 Preop SRS Treatment Tammi Klippel): PTV1 Right Cerebellum was treated to 18 Gy in 1 fraction  07/23/15-09/05/15 ([place]): 50.4 Gy to the right breast + 10 Gy boost  Narrative:  I spoke with the patient to conduct her routine scheduled 3 month follow up visit to review recent MRI brain via telephone to spare the patient unnecessary potential exposure in the healthcare setting during the current COVID-19 pandemic.  The patient was notified in advance and gave permission to proceed with this visit format. Kaitlyn Keith is a pleasant 53 y.o. 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 She was diagnosed with her cancer in July 2016, and underwent neoadjuvant chemotherapy followed by right lumpectomy and adjuvant radiation. She was found to have recurrent disease in February 2018 with a right cerebellar mass, and underwent preoperative SRS treatment followed by surgical resection on 07/29/2016.   She  developed multiple subcutaneous nodules in the neck, upper back, left arm, abdomen and pelvis, noted on PET scan from 11/17/16. She was started on Everolimus on 12/31/16 but was discontinued 04/11/17 due to disease progression with interval development of subcutaneous nodules in the ventral lower left pelvic wall and left flank and interval growth of 3 scattered peritoneal metastases. She started Xeloda in 05/2017 but discontinued due to disease progression on re-staging scans from 06/27/17 which showed a mixed response to therapy with some regression of the previously noted intraperitoneal implants but other lesions with significant interval growth including subcutaneous nodules in the left flank, left vulvar region, right inguinal LAN and anterior mediastinal LAN. She elected to move forward with palliative XRT to 2 painful subcutaneous lesions in the left flank and left pubic/vulvar region which was completed in March 2019.  She tolerated radiotherapy very well and had an excellent response with decreased size of both treated lesions.  Her systemic chemotherapy was switched to [facility] but this was discontinued in October 2019 due to evidence of disease progression on follow-up CT C/A/P from 02/02/2018 indicating continued progression of right inguinal lymph node and right inguinal lymphadenopathy.  She underwent a CT-guided biopsy of the right inguinal lymph node on 03/02/2018 with final pathology confirming metastatic, poorly differentiated carcinoma, ER/PR negative and HER-2 negative.  Her systemic therapy was changed to pembrolizumab immunotherapy at that time and she was also referred back to radiation oncology for consideration of palliative radiotherapy to the painful, progressive right inguinal lymphadenopathy.  At the time of her consult on 03/15/2018, she also mentioned that she had recently developed pain in the left supraclavicular region as well as in the left flank area.  She elected to proceed with  palliative radiotherapy to the 3 sites of painful metastatic disease in the right inguinal, left supraclavicular and left abdomen/flank and this was completed on 03/31/2018.  She tolerated the radiation well and did have significant improvement in her pain.  She has now completed 14 cycles of Keytruda (pembrolizumab) and her most recent CT C/A/P from 12/06/2018 showed complete resolution of metastatic focus in the right inguinal/groin region as well as complete resolution of the left supraclavicular lymphadenopathy. Stable 6 mm hypodense lesion in the liver. No current evidence of active malignancy. She has continued in routine follow up with Dr. Lindi Adie, last seen on 12/28/2018 and the recommendation is to continue with her current immunotherapy with [facility] every 3 weeks.  She has also continued being followed in our multidisciplinary brain tumor conference, and her last MRI scan on 01/13/19 was recently reviewed in brain tumor board this morning and revealed a stable 3 mm enhancing nodule in the right lateral cerebellum at the site of previous tumor treatment, felt to be consistent with treatment related changes especially in light of its stable appearance over the past 3 months. unlikely to be disease recurrence. There were no other enhancing lesions. Today's visit is to review these findings.  On review of systems, the patient reports that she is doing well overall. She denies any chest pain, shortness of breath, cough, fevers, chills, night sweats, or unintended weight changes. She denies any bowel or bladder disturbances, and denies abdominal pain, nausea or vomiting. She denies new visual or auditory changes, dizziness, imbalance, tremors or seizure activity. She has continued having occasional headaches which respond to tylenol or advil if needed.  The headaches became severe back in 10/2018 and she ended up having wisdom tooth extraction with resolution of her HAs. She has had some pain in the left groin/hip  joint region that radiates down her left leg anteriorly to her foot.  This pain is exacerbated with activities and improves with rest.  The pain does not wake her from sleep at night. She has not had any evaluation for this issue and reports mild improvement with taking muscle relaxants prn. She denies any new skin lesions or concerns. A complete review of systems is obtained and is otherwise negative.  Past Medical History:  Past Medical History:  Diagnosis Date  . Anxiety   . Arthritis   . Back pain   . Brain cancer ([place])    brian met from triple negative breast ca  . Breast cancer ([place])   . Breast cancer of lower-outer quadrant of right female breast ([place]) 12/05/2014  . Depression   . FH: chemotherapy 12/2014-04/2015  . Genetic testing 12/10/2016   Kaitlyn Keith underwent genetic counseling and testing for hereditary cancer syndromes on 11/18/2016. 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 Mutations in MSH6 are associated with a hereditary cancer syndrome called Lynch syndrome. For more detailed discussion, please see genetic counseling documentation from 12/10/2016.  Testing was perfo  . Headache    due to brain cancer, no longer having them  . Hot flashes   . Hypertension   . 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 Her results are positive for a pathogenic mutation  in MSH6 called c.[id number] (p.Ile944Metfs*4). Mutations in MSH6 are associated with a hereditary cancer syndrome called Lynch syndrome. For more detailed discussion, please see genetic counseling documentation from 12/10/2016.  Testing was perfo  . 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 Radiation 07/23/15-09/05/15   right breast 50.4 Gy, boost of 10 Gy    Past Surgical History: Past Surgical History:  Procedure Laterality Date  .  APPLICATION OF CRANIAL NAVIGATION Right 07/27/2016   Procedure: APPLICATION OF CRANIAL NAVIGATION;  Surgeon: Kevan Ny Ditty, MD;  Location: [place];  Service: Neurosurgery;  Laterality: Right;  . BIOPSY OF SKIN SUBCUTANEOUS TISSUE AND/OR MUCOUS MEMBRANE Left 12/24/2016   Procedure: OPEN BIOPSY LESIONS ON LEFT LOWER BACK AND SHOULDER BLADE;  Surgeon: [name], MD;  Location: [place];  Service: General;  Laterality: Left;  . BREAST LUMPECTOMY WITH NEEDLE LOCALIZATION AND AXILLARY SENTINEL LYMPH NODE BX Right 06/16/2015   Procedure: BREAST LUMPECTOMY WITH NEEDLE LOCALIZATION AND AXILLARY SENTINEL LYMPH NODE BX;  Surgeon: [name], MD;  Location: [place];  Service: General;  Laterality: Right;  . BREAST REDUCTION SURGERY    . CRANIOTOMY Right 07/27/2016   Procedure: Right Suboccipital craniotomy for tumor resection with stereotactic navigation;  Surgeon: Kevan Ny Ditty, MD;  Location: [place];  Service: Neurosurgery;  Laterality: Right;  . PORT-A-CATH REMOVAL N/A 06/16/2015   Procedure: REMOVAL PORT-A-CATH;  Surgeon: [name], MD;  Location: [place];  Service: General;  Laterality: N/A;  . PORTACATH PLACEMENT N/A 12/23/2014   Procedure: INSERTION PORT-A-CATH;  Surgeon: [name], MD;  Location: [place];  Service: General;  Laterality: N/A;  . PORTACATH PLACEMENT Left 07/08/2017   Procedure: INSERTION PORT-A-CATH;  Surgeon: [name], MD;  Location: [place];  Service: General;  Laterality: Left;    Social History:  Social History   Socioeconomic History  . Marital status: Divorced    Spouse name: Not on file  . Number of children: 1  . Years of education: Not on file  . Highest education level: Not on file  Occupational History  . Not on file  Social Needs  . Financial resource strain: Not on file  . Food insecurity    Worry: Not on file    Inability: Not on file  . Transportation needs     Medical: No    Non-medical: No  Tobacco Use  . Smoking status: Never Smoker  . Smokeless tobacco: Never Used  Substance and Sexual Activity  . Alcohol use: Yes    Comment: social  . Drug use: No  . Sexual activity: Yes  Lifestyle  . Physical activity    Days per week: Not on file    Minutes per session: Not on file  . Stress: Not on file  Relationships  . Social Herbalist on phone: Not on file    Gets together: Not on file    Attends religious service: Not on file    Active member of club or organization: Not on file    Attends meetings of clubs or organizations: Not on file    Relationship status: Not on file  . Intimate partner violence    Fear of current or ex partner: No    Emotionally abused: No    Physically abused: No    Forced sexual activity: No  Other Topics Concern  . Not on file  Social History  Narrative  . Not on file  The patient is divorced. She has worked for [employer] firm, and continues working from home full time.  Family History: Family History  Problem Relation Age of Onset  . Aneurysm Mother 35       d.55  . Heart attack Father 69       d.62  . Endometrial cancer Sister 88  . Lung cancer Maternal Uncle        d.68s  . Cancer Maternal Grandmother        unspecified type-possibly stomach d.88s                          ALLERGIES:  has No Known Allergies.  Meds: Current Outpatient Medications  Medication Sig Dispense Refill  . ALPRAZolam (XANAX) 2 MG tablet Take 1 tablet (2 mg total) by mouth at bedtime. 30 tablet 5  . betamethasone valerate ointment (VALISONE) 0.1 % Apply 1 application topically 2 (two) times daily. 30 g 1  . cyclobenzaprine (FLEXERIL) 5 MG tablet Take 1 tablet (5 mg total) by mouth 3 (three) times daily as needed for muscle spasms. 30 tablet 0  . dexamethasone (DECADRON) 0.1 % ophthalmic suspension Place 1 drop into the right eye every 8 (eight) hours as needed. 5 mL 0  . levothyroxine (SYNTHROID) 125  MCG tablet TAKE 1 TABLET BY MOUTH EVERY DAY BEFORE BREAKFAST 30 tablet 2  . lidocaine-prilocaine (EMLA) cream APPLY TO AFFECTED AREA ONCE AS DIRECTED 30 g 3  . lisinopril-hydrochlorothiazide (PRINZIDE,ZESTORETIC) 20-12.5 MG tablet Take 1 tablet by mouth 2 (two) times daily. 180 tablet 3  . traMADol (ULTRAM) 50 MG tablet Take 1 tablet (50 mg total) by mouth every 12 (twelve) hours as needed for moderate pain. 30 tablet 0  . methylPREDNISolone (MEDROL DOSEPAK) 4 MG TBPK tablet Take as directed (Patient not taking: Reported on 01/17/2019) 21 tablet 0  . triamcinolone ointment (KENALOG) 0.5 % Apply 1 application topically 2 (two) times daily. (Patient not taking: Reported on 01/17/2019) 30 g 0   No current facility-administered medications for this encounter.     Physical Findings:   vitals were not taken for this visit.    Unable to assess due to telephone visit format.   Lab Findings: Lab Results  Component Value Date   WBC 4.9 12/28/2018   WBC 3.0 (L) 03/02/2018   HGB 13.1 12/28/2018   HGB 11.7 04/04/2017   HCT 40.7 12/28/2018   HCT 35.9 04/04/2017   PLT 190 12/28/2018   PLT 207 04/04/2017    Lab Results  Component Value Date   NA 140 12/28/2018   NA 139 04/04/2017   K 3.6 12/28/2018   K 3.7 04/04/2017   CHLORIDE 108 04/04/2017   CO2 23 12/28/2018   CO2 24 04/04/2017   GLUCOSE 90 12/28/2018   GLUCOSE 76 04/04/2017   BUN 16 12/28/2018   BUN 5.8 (L) 04/04/2017   CREATININE 0.75 12/28/2018   CREATININE 0.8 04/04/2017   BILITOT 0.3 12/28/2018   BILITOT 0.43 04/04/2017   ALKPHOS 82 12/28/2018   ALKPHOS 131 04/04/2017   AST 17 12/28/2018   AST 18 04/04/2017   ALT 13 12/28/2018   ALT 12 04/04/2017   PROT 6.9 12/28/2018   PROT 7.0 04/04/2017   ALBUMIN 3.6 12/28/2018   ALBUMIN 3.0 (L) 04/04/2017   CALCIUM 8.7 (L) 12/28/2018   CALCIUM 8.6 04/04/2017   ANIONGAP 11 12/28/2018    Radiographic Findings: Mr Jeri Cos  Wo Contrast  Result Date: 01/13/2019 CLINICAL DATA:   Follow-up treated brain (SRS to right cerebellar lesion March 2018) EXAM: MRI HEAD WITHOUT AND WITH CONTRAST TECHNIQUE: Multiplanar, multiecho pulse sequences of the brain and surrounding structures were obtained without and with intravenous contrast. CONTRAST:  9m MULTIHANCE GADOBENATE DIMEGLUMINE 529 MG/ML IV SOLN COMPARISON:  11/07/2018 FINDINGS: BRAIN New Lesions: None. Larger lesions: None. Stable or Smaller lesions: 3 mm mm enhancing lesion located in the lateral right cerebellum where there is post treatment encephalomalacia and gliosis. Other Brain findings: No incidental infarct, hemorrhage, hydrocephalus, or collection. Vascular: Major flow voids and vascular enhancements are preserved. Skull and upper cervical spine: Remote C2 fracture with healed anterior displacement. No evident bony metastasis. Unremarkable right retromastoid craniotomy. Sinuses/Orbits: Negative. IMPRESSION: 1. Stable 3 mm enhancing nodule in the right cerebellar treatment area. 2. No new lesion. Electronically Signed   By: JMonte FantasiaM.D.   On: 01/13/2019 16:25    Impression/Plan: 1. 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  Her most recent MRI brain from 01/13/19 was recently reviewed with the multidisciplinary tumor board this morning and revealed stability of the 3 mm enhancing nodule in the right lateral cerebellum at the site of previous tumor treatment, felt to be consistent with treatment related changes, especially given the stable appearance over 3 months, unlikely to be disease recurrence. There were no other enhancing lesions. The recommendation is to continue with surveillance MRI brain scans every 3 months with follow up thereafter to review results and recommendations from the multidisciplinary tumor board.  Her most recent systemic imaging from 12/06/18 indicates a positive response to systemic treatment and recent palliative radiotherapy.   She met with her medical oncologist, Dr. GLindi Adiein follow-up on 12/28/18 and the recommendation is to continue with Keytruda q 3 weeks for systemic disease management as she is tolerating this well. She knows to call [place] any questions or concerns in the interim and is comfortable and in agreement with this plan.  I spent 25 minutes in telephone conversation with the patient and more than 50% of that time was spent in counseling and/or coordination of care.    ANicholos Johns PA-C

## 2019-01-17 NOTE — Progress Notes (Signed)
Patient Care Team: Kaitlyn Lose, MD as PCP - General (Hematology and Oncology) Kaitlyn Kussmaul, MD as Consulting Physician (General Surgery) Kaitlyn Lose, MD as Consulting Physician (Hematology and Oncology) Kaitlyn Pray, MD as Consulting Physician (Radiation Oncology) Kaitlyn Kaufmann, RN as Registered Nurse Kaitlyn Germany, RN as Registered Nurse Kaitlyn Bouche, NP (Inactive) as Nurse Practitioner (Nurse Practitioner)  DIAGNOSIS:    ICD-10-CM   1. 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, estrogen receptor negative ([place])  C50.511    Z17.1     SUMMARY OF ONCOLOGIC HISTORY: Oncology History  Breast cancer of lower-outer quadrant of right female breast ([place])  12/03/2014 Mammogram   Right breast mass 1.9 cm it o'clock position 8 cm depth from the nipple   12/03/2014 Initial Diagnosis   Right breast biopsy 8:00: Invasive ductal carcinoma, grade 3, ER 0%, PR 0%, Ki-67 90%, HER-2 negative ratio 1.43   12/10/2014 Breast MRI   Right breast lower outer quadrant: 2.3 x 2.4 x 2.4 cm rim-enhancing mass abuts the pectoralis fascia but no enhancement of pectoralis muscle, second focus of artifact?'s second tissue marker clip, no lymph nodes   12/10/2014 Clinical Stage   Stage IIA: T2 N0   12/24/2014 - 04/29/2015 Neo-Adjuvant Chemotherapy   Dose dense Adriamycin and Cytoxan 4 followed by weekly Abraxane 12   05/02/2015 Breast MRI   complete radiologic response   06/16/2015 Surgery   Left Lumpectomy: Complete path Response, 0/2 LN   06/16/2015 Pathologic Stage   ypT0 ypN0   07/23/2015 - 09/05/2015 Radiation Therapy   Adjuvant RT: 50.4 Gy in 28 fractions and a boost of 10 Gy in 5 fractions to total dose of 60.4 Gy   10/24/2015 Survivorship   SCP mailed to patient in lieu of in person visit.   07/26/2016 - 07/27/2016 Radiation Therapy    SRS brain   07/27/2016 -  07/29/2016 Hospital Admission   Cerebellar mass: Right suboccipital craniotomy for tumor resection with stereotactic navigation: Metastatic poorly differentiated adenocarcinoma with extensive necrosis positive for CK 7, MOC 31, CK 5/6; Neg for Er/PR, GATA-3, GCDFP CDX2, Napsin A and TTF-1   11/17/2016 PET scan   Subcutaneous nodules in the neck, upper back, left arm, abdomen and pelvis,. 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 in BARD 1, BRCA2 and NF1   12/31/2016 Miscellaneous   Everolimus 10 mg daily for cycle 1 if she cannot tolerate will decrease to 7.5 mg daily   05/24/2017 - 07/04/2017 Chemotherapy   Xeloda 2000 mg 2 weeks on 1 week off stopped due to progression of disease based on CT scans done 06/27/2017   07/25/2017 - 03/13/2018 Chemotherapy   [place] days 1 and 8 every 3 weeks stopped for progression    03/20/2018 - 03/31/2018 Radiation Therapy   Radiation to lymph nodes   04/03/2018 -  Chemotherapy   Keytruda every 3 weeks      CHIEF COMPLIANT: Follow-up of metastatic breast cancer on Keytruda  INTERVAL HISTORY: Analyce Keith is a 53 y.o. with above-mentioned history of metastatic breast cancer who is currently receiving pembrolizumab immunotherapyevery 3 weeks.  Brain MRI on 01/13/19 showed a stable 67m nodule in the right cerebellar treatment area and no new lesions. She presents to the clinic today for treatment.   REVIEW OF SYSTEMS:   Constitutional: Denies fevers, chills or abnormal weight loss Eyes: Denies blurriness of vision Ears, nose, mouth, throat, and face: Denies mucositis or sore throat Respiratory: Denies cough, dyspnea or wheezes  Cardiovascular: Denies palpitation, chest discomfort Gastrointestinal: Denies nausea, heartburn or change in bowel habits Skin: Denies abnormal skin rashes Lymphatics: Denies new lymphadenopathy or easy bruising Neurological: Denies numbness, tingling or new weaknesses Behavioral/Psych: Mood is stable, no new changes  Extremities: Pain going for the left leg into the groin Breast: denies any pain or lumps or nodules in either breasts All other systems were reviewed with the patient and are negative.  I have reviewed the past medical history, past surgical history, social history and family history with the patient and they are unchanged from previous note.  ALLERGIES:  has No Known Allergies.  MEDICATIONS:  Current Outpatient Medications  Medication Sig Dispense Refill  . ALPRAZolam (XANAX) 2 MG tablet Take 1 tablet (2 mg total) by mouth at bedtime. 30 tablet 5  . betamethasone valerate ointment (VALISONE) 0.1 % Apply 1 application topically 2 (two) times daily. 30 g 1  . cyclobenzaprine (FLEXERIL) 5 MG tablet Take 1 tablet (5 mg total) by mouth 3 (three) times daily as needed for muscle spasms. 30 tablet 0  . dexamethasone (DECADRON) 0.1 % ophthalmic suspension Place 1 drop into the right eye every 8 (eight) hours as needed. 5 mL 0  . levothyroxine (SYNTHROID) 125 MCG tablet TAKE 1 TABLET BY MOUTH EVERY DAY BEFORE BREAKFAST 30 tablet 2  . lidocaine-prilocaine (EMLA) cream APPLY TO AFFECTED AREA ONCE AS DIRECTED 30 g 3  . lisinopril-hydrochlorothiazide (PRINZIDE,ZESTORETIC) 20-12.5 MG tablet Take 1 tablet by mouth 2 (two) times daily. 180 tablet 3  . methylPREDNISolone (MEDROL DOSEPAK) 4 MG TBPK tablet Take as directed (Patient not taking: Reported on 01/17/2019) 21 tablet 0  . traMADol (ULTRAM) 50 MG tablet Take 1 tablet (50 mg total) by mouth every 12 (twelve) hours as needed for moderate pain. 30 tablet 0  . triamcinolone ointment (KENALOG) 0.5 % Apply 1 application topically 2 (two)  times daily. (Patient not taking: Reported on 01/17/2019) 30 g 0   No current facility-administered medications for this visit.     PHYSICAL EXAMINATION: ECOG PERFORMANCE STATUS: 0 - Asymptomatic  Vitals:   01/18/19 0954  BP: (!) 146/99  Pulse: 73  Resp: 18  Temp: 99.2 F (37.3 C)  SpO2: 99%   Filed Weights   01/18/19 0954  Weight: 181 lb 3.2 oz (82.2 kg)    GENERAL: alert, no distress and comfortable SKIN: skin color, texture, turgor are normal, no rashes or significant lesions EYES: normal, Conjunctiva are pink and non-injected, sclera clear OROPHARYNX: no exudate, no erythema and lips, buccal mucosa, and tongue normal  NECK: supple, thyroid normal size, non-tender, without nodularity LYMPH: no palpable lymphadenopathy in the cervical, axillary or inguinal LUNGS: clear to auscultation and percussion with normal breathing effort HEART: regular rate & rhythm and no murmurs and no lower extremity edema ABDOMEN: abdomen soft, non-tender and normal bowel sounds MUSCULOSKELETAL: no cyanosis of digits and no clubbing  NEURO: alert & oriented x 3 with fluent speech, no focal motor/sensory deficits EXTREMITIES: Pain extending from the left groin into her leg possibly arthritis  LABORATORY DATA:  I have reviewed the data as listed CMP Latest Ref  Rng & Units 01/18/2019 12/28/2018 12/07/2018  Glucose 70 - 99 mg/dL 85 90 94  BUN 6 - 20 mg/dL '10 16 15  ' Creatinine 0.44 - 1.00 mg/dL 0.77 0.75 0.74  Sodium 135 - 145 mmol/L 140 140 140  Potassium 3.5 - 5.1 mmol/L 3.7 3.6 3.8  Chloride 98 - 111 mmol/L 106 106 106  CO2 22 - 32 mmol/L '26 23 26  ' Calcium 8.9 - 10.3 mg/dL 8.7(L) 8.7(L) 8.8(L)  Total Protein 6.5 - 8.1 g/dL 7.1 6.9 6.9  Total Bilirubin 0.3 - 1.2 mg/dL 1.0 0.3 0.4  Alkaline Phos 38 - 126 U/L 82 82 89  AST 15 - 41 U/L '19 17 16  ' ALT 0 - 44 U/L '10 13 10    ' Lab Results  Component Value Date   WBC 3.5 (L) 01/18/2019   HGB 13.4 01/18/2019   HCT 40.3 01/18/2019   MCV 87.6  01/18/2019   PLT 175 01/18/2019   NEUTROABS 2.0 01/18/2019    ASSESSMENT & PLAN:  Breast cancer of lower-outer quadrant of right female breast ([place]) Right breast biopsy 12/03/2014 8:00: Invasive ductal carcinoma, grade 3, ER 0%, PR 0%, 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 Neoadj chemo 12/24/14- 04/29/15 AC x 4 foll by Abraxane X 12 Rt Lumpectomy: Path CR 0/2 LN Adj XRT 07/23/15- 09/08/15 PET/CT scan 11/17/2016:Subcutaneous nodules in the neck, upper back, left arm, abdomen and pelvis Patient progressed on Xeloda January 2019-07/04/2017 stopped due to progression of disease Cerebellar mass diagnosed 07/12/2016: Resection followed by stereotactic radiation 07/29/2016 Lymph node from 03/02/2018 biopsy: PDL1+ ------------------------------------------------------------------------------------------------------------------------------------------------ Current treatment:Pembrolizumab given every 3 weeks starting 04/03/18, today cycle 15 Keytruda toxicities:Hypothyroidism for which she required thyroid replacement therapy Currently on 125 mcg of Synthroid.  Today's TSH is 5.8 so we will keep the dosage the same TSH level: 0.639 on 08/03/2018  Keytruda toxicities: 1.Severe scalp pain along the surgical scars very intense lasting for 48 hours improved with Aleve.Takes gabapentin, applies triamcinolone ointment 2.Right eye dryness: dexamethasone eyedrops. 3.Headaches: Brain MRI 11/07/2018: Unchanged from before. 3 mm enhancing nodule in the right lateral cerebellum which was determined to be scar tissue by brain tumor board. 4.Hypothyroidism:Synthroid 125 mcg.  Today's TSH is 5.8 so we will keep the dosage the same.  CT CAP: 12/06/2018:Complete resolution of metastatic focus in the right inguinal/groin region as well as complete resolution of the left supraclavicular  lymphadenopathy. Stable 6 mm hypodense lesion in the liver. No current evidence of active malignancy.  Brain MRI 01/13/2019: Stable 3 mm nodule  Return to clinic every 3 weeks for pembrolizumab    No orders of the defined types were placed in this encounter.  The patient has a good understanding of the overall plan. she agrees with it. she will call with any problems that may develop before the next visit here.  Kaitlyn Lose, MD 01/18/2019  Julious Oka Dorshimer am acting as scribe for Dr. Nicholas Keith.  I have reviewed the above documentation for accuracy and completeness, and I agree with the above.

## 2019-01-18 ENCOUNTER — Inpatient Hospital Stay (HOSPITAL_BASED_OUTPATIENT_CLINIC_OR_DEPARTMENT_OTHER): Payer: BC Managed Care – PPO | Admitting: Hematology and Oncology

## 2019-01-18 ENCOUNTER — Inpatient Hospital Stay: Payer: BC Managed Care – PPO

## 2019-01-18 ENCOUNTER — Other Ambulatory Visit: Payer: Self-pay

## 2019-01-18 ENCOUNTER — Encounter: Payer: Self-pay | Admitting: *Deleted

## 2019-01-18 VITALS — BP 146/99 | HR 73 | Temp 99.2°F | Resp 18 | Ht 63.0 in | Wt 181.2 lb

## 2019-01-18 DIAGNOSIS — C50511 Malignant neoplasm of lower-outer quadrant of right female breast: Secondary | ICD-10-CM

## 2019-01-18 DIAGNOSIS — C792 Secondary malignant neoplasm of skin: Secondary | ICD-10-CM

## 2019-01-18 DIAGNOSIS — Z171 Estrogen receptor negative status [ER-]: Secondary | ICD-10-CM

## 2019-01-18 DIAGNOSIS — C7931 Secondary malignant neoplasm of brain: Secondary | ICD-10-CM

## 2019-01-18 DIAGNOSIS — Z5112 Encounter for antineoplastic immunotherapy: Secondary | ICD-10-CM | POA: Diagnosis not present

## 2019-01-18 LAB — CBC WITH DIFFERENTIAL (CANCER CENTER ONLY)
Abs Immature Granulocytes: 0.01 10*3/uL (ref 0.00–0.07)
Basophils Absolute: 0 10*3/uL (ref 0.0–0.1)
Basophils Relative: 1 %
Eosinophils Absolute: 0.1 10*3/uL (ref 0.0–0.5)
Eosinophils Relative: 3 %
HCT: 40.3 % (ref 36.0–46.0)
Hemoglobin: 13.4 g/dL (ref 12.0–15.0)
Immature Granulocytes: 0 %
Lymphocytes Relative: 30 %
Lymphs Abs: 1.1 10*3/uL (ref 0.7–4.0)
MCH: 29.1 pg (ref 26.0–34.0)
MCHC: 33.3 g/dL (ref 30.0–36.0)
MCV: 87.6 fL (ref 80.0–100.0)
Monocytes Absolute: 0.3 10*3/uL (ref 0.1–1.0)
Monocytes Relative: 9 %
Neutro Abs: 2 10*3/uL (ref 1.7–7.7)
Neutrophils Relative %: 57 %
Platelet Count: 175 10*3/uL (ref 150–400)
RBC: 4.6 MIL/uL (ref 3.87–5.11)
RDW: 12.7 % (ref 11.5–15.5)
WBC Count: 3.5 10*3/uL — ABNORMAL LOW (ref 4.0–10.5)
nRBC: 0 % (ref 0.0–0.2)

## 2019-01-18 LAB — CMP (CANCER CENTER ONLY)
ALT: 10 U/L (ref 0–44)
AST: 19 U/L (ref 15–41)
Albumin: 3.8 g/dL (ref 3.5–5.0)
Alkaline Phosphatase: 82 U/L (ref 38–126)
Anion gap: 8 (ref 5–15)
BUN: 10 mg/dL (ref 6–20)
CO2: 26 mmol/L (ref 22–32)
Calcium: 8.7 mg/dL — ABNORMAL LOW (ref 8.9–10.3)
Chloride: 106 mmol/L (ref 98–111)
Creatinine: 0.77 mg/dL (ref 0.44–1.00)
GFR, Est AFR Am: >60 mL/min (ref >60)
GFR, Estimated: >60 mL/min (ref >60)
Glucose, Bld: 85 mg/dL (ref 70–99)
Potassium: 3.7 mmol/L (ref 3.5–5.1)
Sodium: 140 mmol/L (ref 135–145)
Total Bilirubin: 1 mg/dL (ref 0.3–1.2)
Total Protein: 7.1 g/dL (ref 6.5–8.1)

## 2019-01-18 LAB — TSH: TSH: 5.825 u[IU]/mL — ABNORMAL HIGH (ref 0.308–3.960)

## 2019-01-18 MED ORDER — SODIUM CHLORIDE 0.9 % IV SOLN
Freq: Once | INTRAVENOUS | Status: AC
  Administered 2019-01-18: 11:00:00 via INTRAVENOUS
  Filled 2019-01-18: qty 250

## 2019-01-18 MED ORDER — SODIUM CHLORIDE 0.9 % IV SOLN
200.0000 mg | Freq: Once | INTRAVENOUS | Status: AC
  Administered 2019-01-18: 200 mg via INTRAVENOUS
  Filled 2019-01-18: qty 8

## 2019-01-18 MED ORDER — SODIUM CHLORIDE 0.9% FLUSH
10.0000 mL | INTRAVENOUS | Status: DC | PRN
  Administered 2019-01-18: 10 mL
  Filled 2019-01-18: qty 10

## 2019-01-18 MED ORDER — HEPARIN SOD (PORK) LOCK FLUSH 100 UNIT/ML IV SOLN
500.0000 [IU] | Freq: Once | INTRAVENOUS | Status: AC | PRN
  Administered 2019-01-18: 500 [IU]
  Filled 2019-01-18: qty 5

## 2019-01-18 NOTE — Patient Instructions (Signed)
[facility] Discharge Instructions for Patients Receiving Chemotherapy  Today you received the following chemotherapy agents:  Keytruda.  To help prevent nausea and vomiting after your treatment, we encourage you to take your nausea medication as directed.   If you develop nausea and vomiting that is not controlled by your nausea medication, call the clinic.   BELOW ARE SYMPTOMS THAT SHOULD BE REPORTED IMMEDIATELY:  *FEVER GREATER THAN 100.5 F  *CHILLS WITH OR WITHOUT FEVER  NAUSEA AND VOMITING THAT IS NOT CONTROLLED WITH YOUR NAUSEA MEDICATION  *UNUSUAL SHORTNESS OF BREATH  *UNUSUAL BRUISING OR BLEEDING  TENDERNESS IN MOUTH AND THROAT WITH OR WITHOUT PRESENCE OF ULCERS  *URINARY PROBLEMS  *BOWEL PROBLEMS  UNUSUAL RASH Items with * indicate a potential emergency and should be followed up as soon as possible.  Feel free to call the clinic should you have any questions or concerns. The clinic phone number is [phone].  Please show the CHEMO ALERT CARD at check-in to the Emergency Department and triage nurse.    

## 2019-01-18 NOTE — Patient Instructions (Signed)

## 2019-02-03 ENCOUNTER — Other Ambulatory Visit: Payer: Self-pay | Admitting: Hematology and Oncology

## 2019-02-03 DIAGNOSIS — C50511 Malignant neoplasm of lower-outer quadrant of right female breast: Secondary | ICD-10-CM

## 2019-02-05 ENCOUNTER — Other Ambulatory Visit: Payer: Self-pay | Admitting: Hematology and Oncology

## 2019-02-05 DIAGNOSIS — C50511 Malignant neoplasm of lower-outer quadrant of right female breast: Secondary | ICD-10-CM

## 2019-02-05 MED ORDER — ALPRAZOLAM 2 MG PO TABS: 2.0000 mg | ORAL_TABLET | Freq: Every day | ORAL | 5 refills | Status: DC

## 2019-02-07 NOTE — Progress Notes (Signed)
Patient Care Team: [name], MD as PCP - General (Hematology and Oncology) [name], [name], [name], [name], [name], [name], NP (Inactive) as Nurse Practitioner (Nurse Practitioner)  DIAGNOSIS:    ICD-10-CM   1. 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, estrogen receptor negative ([place])  C50.511    Z17.1     SUMMARY OF ONCOLOGIC HISTORY: Oncology History  Breast cancer of lower-outer quadrant of right female breast ([place])  12/03/2014 Mammogram   Right breast mass 1.9 cm it o'clock position 8 cm depth from the nipple   12/03/2014 Initial Diagnosis   Right breast biopsy 8:00: Invasive ductal carcinoma, grade 3, ER 0%, PR 0%, Ki-67 90%, HER-2 negative ratio 1.43   12/10/2014 Breast MRI   Right breast lower outer quadrant: 2.3 x 2.4 x 2.4 cm rim-enhancing mass abuts the pectoralis fascia but no enhancement of pectoralis muscle, second focus of artifact?'s second tissue marker clip, no lymph nodes   12/10/2014 Clinical Stage   Stage IIA: T2 N0   12/24/2014 - 04/29/2015 Neo-Adjuvant Chemotherapy   Dose dense Adriamycin and Cytoxan 4 followed by weekly Abraxane 12   05/02/2015 Breast MRI   complete radiologic response   06/16/2015 Surgery   Left Lumpectomy: Complete path Response, 0/2 LN   06/16/2015 Pathologic Stage   ypT0 ypN0   07/23/2015 - 09/05/2015 Radiation Therapy   Adjuvant RT: 50.4 Gy in 28 fractions and a boost of 10 Gy in 5 fractions to total dose of 60.4 Gy   10/24/2015 Survivorship   SCP mailed to patient in lieu of in person visit.   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 in BARD 1, BRCA2 and NF1   12/31/2016 Miscellaneous   Everolimus 10 mg daily for cycle 1 if she cannot tolerate will decrease to 7.5 mg daily   05/24/2017 - 07/04/2017 Chemotherapy   Xeloda 2000 mg 2 weeks on 1 week off stopped due to progression of disease based on CT scans done 06/27/2017   07/25/2017 - 03/13/2018 Chemotherapy   [place] days 1 and 8 every 3 weeks stopped for progression    03/20/2018 - 03/31/2018 Radiation Therapy   Radiation to lymph nodes   04/03/2018 -  Chemotherapy   Keytruda every 3 weeks      CHIEF COMPLIANT: Follow-up of metastatic breast canceronKeytruda  INTERVAL HISTORY: Kaitlyn Keith is a 53 y.o. with above-mentioned history of metastatic breast cancer who is currently receiving pembrolizumab immunotherapyevery 3 weeks. She presents to the clinic today for treatment.  Her biggest complaint is related to pain in the left hip that radiates down  the anterior aspect of the leg.  It is described as some numbness.  It seems to get better when she does activity.  REVIEW OF SYSTEMS:   Constitutional: Denies fevers, chills or abnormal weight loss Eyes: Denies blurriness of vision Ears, nose, mouth, throat, and face: Denies mucositis or sore throat Respiratory: Denies cough, dyspnea or wheezes Cardiovascular:  Denies palpitation, chest discomfort Gastrointestinal: Denies nausea, heartburn or change in bowel habits Skin: Denies abnormal skin rashes Lymphatics: Denies new lymphadenopathy or easy bruising Neurological: Denies numbness, tingling or new weaknesses Behavioral/Psych: Mood is stable, no new changes  Extremities: Left hip discomfort Breast: denies any pain or lumps or nodules in either breasts All other systems were reviewed with the patient and are negative.  I have reviewed the past medical history, past surgical history, social history and family history with the patient and they are unchanged from previous note.  ALLERGIES:  has No Known Allergies.  MEDICATIONS:  Current Outpatient Medications  Medication Sig Dispense Refill  . alprazolam (XANAX) 2 MG tablet Take 1 tablet (2 mg total) by mouth at bedtime. 30 tablet 5  . betamethasone valerate ointment (VALISONE) 0.1 % Apply 1 application topically 2 (two) times daily. 30 g 1  . cyclobenzaprine (FLEXERIL) 5 MG tablet Take 1 tablet (5 mg total) by mouth 3 (three) times daily as needed for muscle spasms. 30 tablet 0  . dexamethasone (DECADRON) 0.1 % ophthalmic suspension Place 1 drop into the right eye every 8 (eight) hours as needed. 5 mL 0  . levothyroxine (SYNTHROID) 125 MCG tablet TAKE 1 TABLET BY MOUTH EVERY DAY BEFORE BREAKFAST 30 tablet 2  . lidocaine-prilocaine (EMLA) cream APPLY TO AFFECTED AREA ONCE AS DIRECTED 30 g 3  . lisinopril-hydrochlorothiazide (PRINZIDE,ZESTORETIC) 20-12.5 MG tablet Take 1 tablet by mouth 2 (two) times daily. 180 tablet 3  . methylPREDNISolone (MEDROL DOSEPAK) 4 MG TBPK tablet Take as directed (Patient not taking: Reported on 01/17/2019) 21 tablet 0  . traMADol (ULTRAM) 50 MG tablet Take 1 tablet (50 mg total) by mouth every 12 (twelve) hours as needed for moderate pain. 30 tablet 0  . triamcinolone ointment (KENALOG) 0.5 % Apply 1 application topically 2 (two) times daily. (Patient not taking: Reported  on 01/17/2019) 30 g 0   No current facility-administered medications for this visit.    Facility-Administered Medications Ordered in Other Visits  Medication Dose Route Frequency Provider Last Rate Last Dose  . sodium chloride flush (NS) 0.9 % injection 10 mL  10 mL Intracatheter PRN [name], MD   10 mL at 02/08/19 0908    PHYSICAL EXAMINATION: ECOG PERFORMANCE STATUS: 1 - Symptomatic but completely ambulatory  There were no vitals filed for this visit. There were no vitals filed for this visit.  GENERAL: alert, no distress and comfortable SKIN: skin color, texture, turgor are normal, no rashes or significant lesions EYES: normal, Conjunctiva are pink and non-injected, sclera clear OROPHARYNX: no exudate, no erythema and lips, buccal mucosa, and tongue normal  NECK: supple, thyroid normal size, non-tender, without nodularity LYMPH: no palpable lymphadenopathy in the cervical, axillary or inguinal LUNGS: clear to auscultation and percussion with normal breathing effort HEART: regular rate & rhythm and no murmurs and no lower extremity edema ABDOMEN: abdomen soft, non-tender and normal bowel sounds MUSCULOSKELETAL: no cyanosis of digits and no clubbing  NEURO: alert & oriented x 3 with fluent speech, no focal motor/sensory deficits EXTREMITIES: No lower extremity edema  LABORATORY DATA:  I have reviewed the data as listed  CMP Latest Ref Rng & Units 01/18/2019 12/28/2018 12/07/2018  Glucose 70 - 99 mg/dL 85 90 94  BUN 6 - 20 mg/dL _0 Creatinine 0.44 - 1.00 mg/dL 0.77 0.75 0.74  Sodium 135 - 145 mmol/L 140 140 140  Potassium 3.5 - 5.1 mmol/L 3.7 3.6 3.8  Chloride 98 - 111 mmol/L 106 106 106  CO2 22 - 32 mmol/L _1 Calcium 8.9 - 10.3 mg/dL 8.7(L) 8.7(L) 8.8(L)  Total Protein 6.5 - 8.1 g/dL 7.1 6.9 6.9  Total Bilirubin 0.3 - 1.2 mg/dL 1.0 0.3 0.4  Alkaline Phos 38 - 126 U/L 82 82 89  AST 15 - 41 U/L _2 ALT 0 - 44 U/L _3 Lab Results  Component  Value Date   WBC 3.5 (L) 01/18/2019   HGB 13.4 01/18/2019   HCT 40.3 01/18/2019   MCV 87.6 01/18/2019   PLT 175 01/18/2019   NEUTROABS 2.0 01/18/2019    ASSESSMENT & PLAN:  Breast cancer of lower-outer quadrant of right female breast ([place]) Right breast biopsy 12/03/2014 8:00: Invasive ductal carcinoma, grade 3, ER 0%, PR 0%, 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 Neoadj chemo 12/24/14- 04/29/15 AC x 4 foll by Abraxane X 12 Rt Lumpectomy: Path CR 0/2 LN Adj XRT 07/23/15- 09/08/15 PET/CT scan 11/17/2016:Subcutaneous nodules in the neck, upper back, left arm, abdomen and pelvis Patient progressed on Xeloda January 2019-07/04/2017 stopped due to progression of disease Cerebellar mass diagnosed 07/12/2016: Resection followed by stereotactic radiation 07/29/2016 Lymph node from 03/02/2018 biopsy: PDL1+ ------------------------------------------------------------------------------------------------------------------------------------------------ Current treatment:Pembrolizumab given every 3 weeks starting 04/03/18, today cycle 16 Keytruda toxicities:Hypothyroidism for which she required thyroid replacement therapy Currently on 125 mcg of Synthroid.  TSH level: 5.825 on 01/18/2019  Keytruda toxicities: 1.Severe scalp pain along the surgical scars very intense lasting for 48 hours improved with Aleve.Takes gabapentin, applies triamcinolone ointment 2.Right eye dryness: dexamethasone eyedrops. 3.Headaches: Brain MRI 11/07/2018: Unchanged from before. 3 mm enhancing nodule in the right lateral cerebellum which was determined to be scar tissue by brain tumor board. 4.Hypothyroidism:Synthroid 125 mcg.  Today's TSH is 5.8 so we will keep the dosage the same.  CT CAP: 12/06/2018:Complete resolution of metastatic focus in the right inguinal/groin region as well as complete  resolution of the left supraclavicular lymphadenopathy. Stable 6 mm hypodense lesion in the liver. No current evidence of active malignancy.  Brain MRI 01/13/2019: Stable 3 mm nodule Left hip discomfort: We will order a left hip MRI to evaluate this further.  I suspect nerve compression or musculoskeletal in origin.  Return to clinic every 3 weeks for pembrolizumab    No orders of the defined types were placed in this encounter.  The patient has a good understanding of the overall plan. she agrees with it. she will call with any problems that may develop before the next visit here.  [name], MD 02/08/2019  Julious Oka Dorshimer am acting as scribe for Dr. [name].  I have reviewed the above documentation for accuracy and completeness, and I agree with the above.

## 2019-02-08 ENCOUNTER — Inpatient Hospital Stay (HOSPITAL_BASED_OUTPATIENT_CLINIC_OR_DEPARTMENT_OTHER): Payer: BC Managed Care – PPO | Admitting: Hematology and Oncology

## 2019-02-08 ENCOUNTER — Other Ambulatory Visit: Payer: Self-pay

## 2019-02-08 ENCOUNTER — Inpatient Hospital Stay: Payer: BC Managed Care – PPO

## 2019-02-08 ENCOUNTER — Inpatient Hospital Stay: Payer: BC Managed Care – PPO | Attending: Hematology and Oncology

## 2019-02-08 VITALS — BP 128/86 | HR 75 | Temp 98.4°F | Resp 17 | Ht 63.0 in | Wt 185.5 lb

## 2019-02-08 DIAGNOSIS — Z9221 Personal history of antineoplastic chemotherapy: Secondary | ICD-10-CM | POA: Insufficient documentation

## 2019-02-08 DIAGNOSIS — M25552 Pain in left hip: Secondary | ICD-10-CM

## 2019-02-08 DIAGNOSIS — Z923 Personal history of irradiation: Secondary | ICD-10-CM | POA: Insufficient documentation

## 2019-02-08 DIAGNOSIS — Z79899 Other long term (current) drug therapy: Secondary | ICD-10-CM | POA: Diagnosis not present

## 2019-02-08 DIAGNOSIS — Z5112 Encounter for antineoplastic immunotherapy: Secondary | ICD-10-CM | POA: Insufficient documentation

## 2019-02-08 DIAGNOSIS — C7931 Secondary malignant neoplasm of brain: Secondary | ICD-10-CM | POA: Diagnosis present

## 2019-02-08 DIAGNOSIS — G620 Drug-induced polyneuropathy: Secondary | ICD-10-CM

## 2019-02-08 DIAGNOSIS — C50511 Malignant neoplasm of lower-outer quadrant of right female breast: Secondary | ICD-10-CM

## 2019-02-08 DIAGNOSIS — Z171 Estrogen receptor negative status [ER-]: Secondary | ICD-10-CM

## 2019-02-08 DIAGNOSIS — T451X5A Adverse effect of antineoplastic and immunosuppressive drugs, initial encounter: Secondary | ICD-10-CM

## 2019-02-08 DIAGNOSIS — E039 Hypothyroidism, unspecified: Secondary | ICD-10-CM | POA: Insufficient documentation

## 2019-02-08 DIAGNOSIS — C792 Secondary malignant neoplasm of skin: Secondary | ICD-10-CM

## 2019-02-08 LAB — CBC WITH DIFFERENTIAL (CANCER CENTER ONLY)
Abs Immature Granulocytes: 0.01 10*3/uL (ref 0.00–0.07)
Basophils Absolute: 0 10*3/uL (ref 0.0–0.1)
Basophils Relative: 1 %
Eosinophils Absolute: 0.2 10*3/uL (ref 0.0–0.5)
Eosinophils Relative: 4 %
HCT: 41.5 % (ref 36.0–46.0)
Hemoglobin: 13.6 g/dL (ref 12.0–15.0)
Immature Granulocytes: 0 %
Lymphocytes Relative: 28 %
Lymphs Abs: 1.1 10*3/uL (ref 0.7–4.0)
MCH: 29.3 pg (ref 26.0–34.0)
MCHC: 32.8 g/dL (ref 30.0–36.0)
MCV: 89.4 fL (ref 80.0–100.0)
Monocytes Absolute: 0.4 10*3/uL (ref 0.1–1.0)
Monocytes Relative: 9 %
Neutro Abs: 2.4 10*3/uL (ref 1.7–7.7)
Neutrophils Relative %: 58 %
Platelet Count: 178 10*3/uL (ref 150–400)
RBC: 4.64 MIL/uL (ref 3.87–5.11)
RDW: 12.7 % (ref 11.5–15.5)
WBC Count: 4.1 10*3/uL (ref 4.0–10.5)
nRBC: 0 % (ref 0.0–0.2)

## 2019-02-08 LAB — CMP (CANCER CENTER ONLY)
ALT: 10 U/L (ref 0–44)
AST: 20 U/L (ref 15–41)
Albumin: 4 g/dL (ref 3.5–5.0)
Alkaline Phosphatase: 87 U/L (ref 38–126)
Anion gap: 8 (ref 5–15)
BUN: 14 mg/dL (ref 6–20)
CO2: 27 mmol/L (ref 22–32)
Calcium: 8.9 mg/dL (ref 8.9–10.3)
Chloride: 105 mmol/L (ref 98–111)
Creatinine: 0.78 mg/dL (ref 0.44–1.00)
GFR, Est AFR Am: >60 mL/min (ref >60)
GFR, Estimated: >60 mL/min (ref >60)
Glucose, Bld: 96 mg/dL (ref 70–99)
Potassium: 3.7 mmol/L (ref 3.5–5.1)
Sodium: 140 mmol/L (ref 135–145)
Total Bilirubin: 0.6 mg/dL (ref 0.3–1.2)
Total Protein: 7.3 g/dL (ref 6.5–8.1)

## 2019-02-08 LAB — TSH: TSH: 2.006 u[IU]/mL (ref 0.308–3.960)

## 2019-02-08 MED ORDER — SODIUM CHLORIDE 0.9 % IV SOLN
Freq: Once | INTRAVENOUS | Status: AC
  Administered 2019-02-08: 10:00:00 via INTRAVENOUS
  Filled 2019-02-08: qty 250

## 2019-02-08 MED ORDER — HEPARIN SOD (PORK) LOCK FLUSH 100 UNIT/ML IV SOLN
500.0000 [IU] | Freq: Once | INTRAVENOUS | Status: AC | PRN
  Administered 2019-02-08: 500 [IU]
  Filled 2019-02-08: qty 5

## 2019-02-08 MED ORDER — SODIUM CHLORIDE 0.9 % IV SOLN
200.0000 mg | Freq: Once | INTRAVENOUS | Status: AC
  Administered 2019-02-08: 200 mg via INTRAVENOUS
  Filled 2019-02-08: qty 8

## 2019-02-08 MED ORDER — SODIUM CHLORIDE 0.9% FLUSH
10.0000 mL | INTRAVENOUS | Status: DC | PRN
  Administered 2019-02-08: 10 mL
  Filled 2019-02-08: qty 10

## 2019-02-08 MED ORDER — ALPRAZOLAM 2 MG PO TABS: 2.0000 mg | ORAL_TABLET | Freq: Every day | ORAL | 5 refills | Status: DC

## 2019-02-08 NOTE — Assessment & Plan Note (Signed)
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 Neoadj chemo 12/24/14- 04/29/15 AC x 4 foll by Abraxane X 12 Rt Lumpectomy: Path CR 0/2 LN Adj XRT 07/23/15- 09/08/15 PET/CT scan 11/17/2016:Subcutaneous nodules in the neck, upper back, left arm, abdomen and pelvis Patient progressed on Xeloda January 2019-07/04/2017 stopped due to progression of disease Cerebellar mass diagnosed 07/12/2016: Resection followed by stereotactic radiation 07/29/2016 Lymph node from 03/02/2018 biopsy: PDL1+ ------------------------------------------------------------------------------------------------------------------------------------------------ Current treatment:Pembrolizumab given every 3 weeks starting 04/03/18, today cycle 16 Keytruda toxicities:Hypothyroidism for which she required thyroid replacement therapy Currently on 125 mcg of Synthroid.  Today's TSH is 5.8 so we will keep the dosage the same TSH level: 0.639 on 08/03/2018  Keytruda toxicities: 1.Severe scalp pain along the surgical scars very intense lasting for 48 hours improved with Aleve.Takes gabapentin, applies triamcinolone ointment 2.Right eye dryness: dexamethasone eyedrops. 3.Headaches: Brain MRI 11/07/2018: Unchanged from before. 3 mm enhancing nodule in the right lateral cerebellum which was determined to be scar tissue by brain tumor board. 4.Hypothyroidism:Synthroid 125 mcg.  Today's TSH is 5.8 so we will keep the dosage the same.  CT CAP: 12/06/2018:Complete resolution of metastatic focus in the right inguinal/groin region as well as complete resolution of the left supraclavicular lymphadenopathy. Stable 6 mm hypodense lesion in the liver. No current evidence of active malignancy.  Brain MRI 01/13/2019: Stable 3 mm nodule  Return  to clinic every 3 weeks for pembrolizumab

## 2019-02-08 NOTE — Patient Instructions (Signed)
[facility] Discharge Instructions for Patients Receiving Chemotherapy  Today you received the following chemotherapy agents:  Keytruda.  To help prevent nausea and vomiting after your treatment, we encourage you to take your nausea medication as directed.   If you develop nausea and vomiting that is not controlled by your nausea medication, call the clinic.   BELOW ARE SYMPTOMS THAT SHOULD BE REPORTED IMMEDIATELY:  *FEVER GREATER THAN 100.5 F  *CHILLS WITH OR WITHOUT FEVER  NAUSEA AND VOMITING THAT IS NOT CONTROLLED WITH YOUR NAUSEA MEDICATION  *UNUSUAL SHORTNESS OF BREATH  *UNUSUAL BRUISING OR BLEEDING  TENDERNESS IN MOUTH AND THROAT WITH OR WITHOUT PRESENCE OF ULCERS  *URINARY PROBLEMS  *BOWEL PROBLEMS  UNUSUAL RASH Items with * indicate a potential emergency and should be followed up as soon as possible.  Feel free to call the clinic should you have any questions or concerns. The clinic phone number is [phone].  Please show the CHEMO ALERT CARD at check-in to the Emergency Department and triage nurse.    

## 2019-02-28 NOTE — Progress Notes (Signed)
Patient Care Team: [name], MD as PCP - General (Hematology and Oncology) [name], [name], [name], [name], [name], [name], NP (Inactive) as Nurse Practitioner (Nurse Practitioner)  DIAGNOSIS:    ICD-10-CM   1. 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, estrogen receptor negative ([place])  C50.511    Z17.1     SUMMARY OF ONCOLOGIC HISTORY: Oncology History  Breast cancer of lower-outer quadrant of right female breast ([place])  12/03/2014 Mammogram   Right breast mass 1.9 cm it o'clock position 8 cm depth from the nipple   12/03/2014 Initial Diagnosis   Right breast biopsy 8:00: Invasive ductal carcinoma, grade 3, ER 0%, PR 0%, Ki-67 90%, HER-2 negative ratio 1.43   12/10/2014 Breast MRI   Right breast lower outer quadrant: 2.3 x 2.4 x 2.4 cm rim-enhancing mass abuts the pectoralis fascia but no enhancement of pectoralis muscle, second focus of artifact?'s second tissue marker clip, no lymph nodes   12/10/2014 Clinical Stage   Stage IIA: T2 N0   12/24/2014 - 04/29/2015 Neo-Adjuvant Chemotherapy   Dose dense Adriamycin and Cytoxan 4 followed by weekly Abraxane 12   05/02/2015 Breast MRI   complete radiologic response   06/16/2015 Surgery   Left Lumpectomy: Complete path Response, 0/2 LN   06/16/2015 Pathologic Stage   ypT0 ypN0   07/23/2015 - 09/05/2015 Radiation Therapy   Adjuvant RT: 50.4 Gy in 28 fractions and a boost of 10 Gy in 5 fractions to total dose of 60.4 Gy   10/24/2015 Survivorship   SCP mailed to patient in lieu of in person visit.   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 in BARD 1, BRCA2 and NF1   12/31/2016 Miscellaneous   Everolimus 10 mg daily for cycle 1 if she cannot tolerate will decrease to 7.5 mg daily   05/24/2017 - 07/04/2017 Chemotherapy   Xeloda 2000 mg 2 weeks on 1 week off stopped due to progression of disease based on CT scans done 06/27/2017   07/25/2017 - 03/13/2018 Chemotherapy   [place] days 1 and 8 every 3 weeks stopped for progression    03/20/2018 - 03/31/2018 Radiation Therapy   Radiation to lymph nodes   04/03/2018 -  Chemotherapy   Keytruda every 3 weeks      CHIEF COMPLIANT: Follow-up of metastatic breast canceronKeytruda  INTERVAL HISTORY: Kaitlyn Keith is a 53 y.o. with above-mentioned history of metastatic breast cancer who is currently receiving pembrolizumab immunotherapyevery 3 weeks. She presents to the clinic today for treatment.   She continues to have left hip pain for which he is scheduled to  undergo left hip MRI today.  She is also extremely concerned about her weight gain.  She is trying extremely hard but has not lost any weight.  REVIEW OF SYSTEMS:   Constitutional: Denies fevers, chills or abnormal weight loss Eyes: Denies blurriness of vision Ears, nose, mouth, throat, and face: Denies mucositis or sore throat Respiratory: Denies cough, dyspnea or wheezes  Cardiovascular: Denies palpitation, chest discomfort Gastrointestinal: Denies nausea, heartburn or change in bowel habits Skin: Denies abnormal skin rashes Lymphatics: Denies new lymphadenopathy or easy bruising Neurological: Denies numbness, tingling or new weaknesses Behavioral/Psych: Mood is stable, no new changes  Extremities: No lower extremity edema Breast: denies any pain or lumps or nodules in either breasts All other systems were reviewed with the patient and are negative.  I have reviewed the past medical history, past surgical history, social history and family history with the patient and they are unchanged from previous note.  ALLERGIES:  has No Known Allergies.  MEDICATIONS:  Current Outpatient Medications  Medication Sig Dispense Refill  . alprazolam (XANAX) 2 MG tablet Take 1 tablet (2 mg total) by mouth at bedtime. 30 tablet 5  . betamethasone valerate ointment (VALISONE) 0.1 % Apply 1 application topically 2 (two) times daily. 30 g 1  . cyclobenzaprine (FLEXERIL) 5 MG tablet Take 1 tablet (5 mg total) by mouth 3 (three) times daily as needed for muscle spasms. 30 tablet 0  . dexamethasone (DECADRON) 0.1 % ophthalmic suspension Place 1 drop into the right eye every 8 (eight) hours as needed. 5 mL 0  . levothyroxine (SYNTHROID) 125 MCG tablet TAKE 1 TABLET BY MOUTH EVERY DAY BEFORE BREAKFAST 30 tablet 2  . lidocaine-prilocaine (EMLA) cream APPLY TO AFFECTED AREA ONCE AS DIRECTED 30 g 3  . lisinopril-hydrochlorothiazide (PRINZIDE,ZESTORETIC) 20-12.5 MG tablet Take 1 tablet by mouth 2 (two) times daily. 180 tablet 3  . phentermine 15 MG capsule Take 1 capsule (15 mg total) by mouth every morning. 30 capsule 1  . traMADol (ULTRAM) 50 MG tablet Take 1 tablet (50 mg total) by mouth every 12 (twelve) hours as needed for moderate pain. 30 tablet 0  . triamcinolone ointment (KENALOG) 0.5 % Apply 1 application topically 2 (two) times daily. (Patient not taking: Reported on  01/17/2019) 30 g 0   No current facility-administered medications for this visit.     PHYSICAL EXAMINATION: ECOG PERFORMANCE STATUS: 1 - Symptomatic but completely ambulatory  Vitals:   03/01/19 0918  BP: 126/87  Pulse: 72  Resp: 18  Temp: 97.9 F (36.6 C)  SpO2: 98%   Filed Weights   03/01/19 0918  Weight: 185 lb 1.6 oz (84 kg)    GENERAL: alert, no distress and comfortable SKIN: skin color, texture, turgor are normal, no rashes or significant lesions EYES: normal, Conjunctiva are pink and non-injected, sclera clear OROPHARYNX: no exudate, no erythema and lips, buccal mucosa, and tongue normal  NECK: supple, thyroid normal size, non-tender, without nodularity LYMPH: no palpable lymphadenopathy in the cervical, axillary or inguinal LUNGS: clear to auscultation and percussion with normal breathing effort HEART: regular rate & rhythm and no murmurs and no lower extremity edema ABDOMEN: abdomen soft, non-tender and normal bowel sounds MUSCULOSKELETAL: no cyanosis of digits and no clubbing  NEURO: alert & oriented x 3 with fluent speech, no focal motor/sensory deficits EXTREMITIES: No lower extremity edema  LABORATORY DATA:  I have reviewed the data as listed CMP Latest Ref Rng & Units 03/01/2019 02/08/2019 01/18/2019  Glucose 70 - 99 mg/dL 115(H)  96 85  BUN 6 - 20 mg/dL _0 Creatinine 0.44 - 1.00 mg/dL 0.76 0.78 0.77  Sodium 135 - 145 mmol/L 140 140 140  Potassium 3.5 - 5.1 mmol/L 3.5 3.7 3.7  Chloride 98 - 111 mmol/L 107 105 106  CO2 22 - 32 mmol/L _1 Calcium 8.9 - 10.3 mg/dL 8.6(L) 8.9 8.7(L)  Total Protein 6.5 - 8.1 g/dL 6.9 7.3 7.1  Total Bilirubin 0.3 - 1.2 mg/dL 0.5 0.6 1.0  Alkaline Phos 38 - 126 U/L 91 87 82  AST 15 - 41 U/L _2 ALT 0 - 44 U/L _3 Lab Results  Component Value Date   WBC 3.7 (L) 03/01/2019   HGB 13.1 03/01/2019   HCT 39.8 03/01/2019   MCV 89.6 03/01/2019   PLT 171 03/01/2019   NEUTROABS 2.2 03/01/2019     ASSESSMENT & PLAN:  Breast cancer of lower-outer quadrant of right female breast ([place]) Right breast biopsy 12/03/2014 8:00: Invasive ductal carcinoma, grade 3, ER 0%, PR 0%, 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 Neoadj chemo 12/24/14- 04/29/15 AC x 4 foll by Abraxane X 12 Rt Lumpectomy: Path CR 0/2 LN Adj XRT 07/23/15- 09/08/15 PET/CT scan 11/17/2016:Subcutaneous nodules in the neck, upper back, left arm, abdomen and pelvis Patient progressed on Xeloda January 2019-07/04/2017 stopped due to progression of disease Cerebellar mass diagnosed 07/12/2016: Resection followed by stereotactic radiation 07/29/2016 Lymph node from 03/02/2018 biopsy: PDL1+ ------------------------------------------------------------------------------------------------------------------------------------------------ Current treatment:Pembrolizumab given every 3 weeks starting 04/03/18, today cycle 17 Keytruda toxicities:Hypothyroidism for which she required thyroid replacement therapy Currently on 131mg of Synthroid.  TSH level: 5.825 on 01/18/2019  Keytruda toxicities: 1.Severe scalp pain along the surgical scars very intense lasting for 48 hours improved with Aleve.Takes gabapentin, applies triamcinolone ointment 2.Right eye dryness: dexamethasone eyedrops. 3.Headaches: Brain MRI 11/07/2018: Unchanged from before. 3 mm enhancing nodule in the right lateral cerebellum which was determined to be scar tissue by brain tumor board. 4.Hypothyroidism:Synthroid1241m.Today's TSH is 5.8 so we will keep the dosage the same.  CT CAP: 12/06/2018:Complete resolution of metastatic focus in the right inguinal/groin region as well as complete resolution of the left supraclavicular lymphadenopathy. Stable 6 mm hypodense lesion in the liver. No current evidence of active malignancy.  Brain  MRI8/22/2020:Stable 3 mm nodule Left hip discomfort: She is getting an MRI of the left hip  Weight gain: I sent a prescription for phentermine.  We discussed the risks and benefits.  I also discussed the abuse and addiction potential.  We will use it for very short period of time for up to 2 months.  I gave her a low-dose of 15 mg.  Return to clinic every 3 weeks for pembrolizumab    No orders of the defined types were placed in this encounter.  The patient has a good understanding of the overall plan. she agrees with it. she will call with any problems that may develop before the next visit here.  GuNicholas LoseMD 03/01/2019  I,Julious Okaorshimer am acting as scribe for Dr. ViNicholas Lose I have reviewed the above documentation for accuracy and completeness, and I agree with the above.

## 2019-03-01 ENCOUNTER — Inpatient Hospital Stay: Payer: BC Managed Care – PPO

## 2019-03-01 ENCOUNTER — Ambulatory Visit
Admission: RE | Admit: 2019-03-01 | Discharge: 2019-03-01 | Disposition: A | Discharge status: Home / Self Care | Payer: BC Managed Care – PPO | Source: Ambulatory Visit | Attending: Hematology and Oncology | Admitting: Hematology and Oncology

## 2019-03-01 ENCOUNTER — Inpatient Hospital Stay: Payer: BC Managed Care – PPO | Attending: Hematology and Oncology

## 2019-03-01 ENCOUNTER — Other Ambulatory Visit: Payer: Self-pay

## 2019-03-01 ENCOUNTER — Inpatient Hospital Stay (HOSPITAL_BASED_OUTPATIENT_CLINIC_OR_DEPARTMENT_OTHER): Payer: BC Managed Care – PPO | Admitting: Hematology and Oncology

## 2019-03-01 DIAGNOSIS — C7931 Secondary malignant neoplasm of brain: Secondary | ICD-10-CM | POA: Diagnosis present

## 2019-03-01 DIAGNOSIS — C50511 Malignant neoplasm of lower-outer quadrant of right female breast: Secondary | ICD-10-CM

## 2019-03-01 DIAGNOSIS — R635 Abnormal weight gain: Secondary | ICD-10-CM | POA: Insufficient documentation

## 2019-03-01 DIAGNOSIS — C792 Secondary malignant neoplasm of skin: Secondary | ICD-10-CM

## 2019-03-01 DIAGNOSIS — R519 Headache, unspecified: Secondary | ICD-10-CM | POA: Diagnosis not present

## 2019-03-01 DIAGNOSIS — Z23 Encounter for immunization: Secondary | ICD-10-CM | POA: Diagnosis not present

## 2019-03-01 DIAGNOSIS — Z171 Estrogen receptor negative status [ER-]: Secondary | ICD-10-CM | POA: Diagnosis not present

## 2019-03-01 DIAGNOSIS — Z923 Personal history of irradiation: Secondary | ICD-10-CM | POA: Diagnosis not present

## 2019-03-01 DIAGNOSIS — Z9221 Personal history of antineoplastic chemotherapy: Secondary | ICD-10-CM | POA: Diagnosis not present

## 2019-03-01 DIAGNOSIS — Z5112 Encounter for antineoplastic immunotherapy: Secondary | ICD-10-CM | POA: Diagnosis not present

## 2019-03-01 DIAGNOSIS — G620 Drug-induced polyneuropathy: Secondary | ICD-10-CM

## 2019-03-01 DIAGNOSIS — G893 Neoplasm related pain (acute) (chronic): Secondary | ICD-10-CM | POA: Diagnosis not present

## 2019-03-01 DIAGNOSIS — E039 Hypothyroidism, unspecified: Secondary | ICD-10-CM | POA: Insufficient documentation

## 2019-03-01 DIAGNOSIS — Z79899 Other long term (current) drug therapy: Secondary | ICD-10-CM | POA: Diagnosis not present

## 2019-03-01 DIAGNOSIS — M25552 Pain in left hip: Secondary | ICD-10-CM

## 2019-03-01 LAB — CBC WITH DIFFERENTIAL (CANCER CENTER ONLY)
Abs Immature Granulocytes: 0.01 10*3/uL (ref 0.00–0.07)
Basophils Absolute: 0 10*3/uL (ref 0.0–0.1)
Basophils Relative: 1 %
Eosinophils Absolute: 0.1 10*3/uL (ref 0.0–0.5)
Eosinophils Relative: 3 %
HCT: 39.8 % (ref 36.0–46.0)
Hemoglobin: 13.1 g/dL (ref 12.0–15.0)
Immature Granulocytes: 0 %
Lymphocytes Relative: 27 %
Lymphs Abs: 1 10*3/uL (ref 0.7–4.0)
MCH: 29.5 pg (ref 26.0–34.0)
MCHC: 32.9 g/dL (ref 30.0–36.0)
MCV: 89.6 fL (ref 80.0–100.0)
Monocytes Absolute: 0.3 10*3/uL (ref 0.1–1.0)
Monocytes Relative: 8 %
Neutro Abs: 2.2 10*3/uL (ref 1.7–7.7)
Neutrophils Relative %: 61 %
Platelet Count: 171 10*3/uL (ref 150–400)
RBC: 4.44 MIL/uL (ref 3.87–5.11)
RDW: 12.3 % (ref 11.5–15.5)
WBC Count: 3.7 10*3/uL — ABNORMAL LOW (ref 4.0–10.5)
nRBC: 0 % (ref 0.0–0.2)

## 2019-03-01 LAB — CMP (CANCER CENTER ONLY)
ALT: 10 U/L (ref 0–44)
AST: 16 U/L (ref 15–41)
Albumin: 3.6 g/dL (ref 3.5–5.0)
Alkaline Phosphatase: 91 U/L (ref 38–126)
Anion gap: 7 (ref 5–15)
BUN: 13 mg/dL (ref 6–20)
CO2: 26 mmol/L (ref 22–32)
Calcium: 8.6 mg/dL — ABNORMAL LOW (ref 8.9–10.3)
Chloride: 107 mmol/L (ref 98–111)
Creatinine: 0.76 mg/dL (ref 0.44–1.00)
GFR, Est AFR Am: >60 mL/min (ref >60)
GFR, Estimated: >60 mL/min (ref >60)
Glucose, Bld: 115 mg/dL — ABNORMAL HIGH (ref 70–99)
Potassium: 3.5 mmol/L (ref 3.5–5.1)
Sodium: 140 mmol/L (ref 135–145)
Total Bilirubin: 0.5 mg/dL (ref 0.3–1.2)
Total Protein: 6.9 g/dL (ref 6.5–8.1)

## 2019-03-01 LAB — TSH: TSH: 3.119 u[IU]/mL (ref 0.308–3.960)

## 2019-03-01 MED ORDER — INFLUENZA VAC SPLIT QUAD 0.5 ML IM SUSY
PREFILLED_SYRINGE | INTRAMUSCULAR | Status: AC
  Filled 2019-03-01: qty 0.5

## 2019-03-01 MED ORDER — GADOBENATE DIMEGLUMINE 529 MG/ML IV SOLN
17.0000 mL | Freq: Once | INTRAVENOUS | Status: AC | PRN
  Administered 2019-03-01: 17 mL via INTRAVENOUS

## 2019-03-01 MED ORDER — SODIUM CHLORIDE 0.9% FLUSH
10.0000 mL | INTRAVENOUS | Status: DC | PRN
  Administered 2019-03-01: 10 mL
  Filled 2019-03-01: qty 10

## 2019-03-01 MED ORDER — SODIUM CHLORIDE 0.9 % IV SOLN
Freq: Once | INTRAVENOUS | Status: AC
  Administered 2019-03-01: 10:00:00 via INTRAVENOUS
  Filled 2019-03-01: qty 250

## 2019-03-01 MED ORDER — SODIUM CHLORIDE 0.9 % IV SOLN
200.0000 mg | Freq: Once | INTRAVENOUS | Status: AC
  Administered 2019-03-01: 200 mg via INTRAVENOUS
  Filled 2019-03-01: qty 8

## 2019-03-01 MED ORDER — PHENTERMINE HCL 15 MG PO CAPS: 15.0000 mg | ORAL_CAPSULE | ORAL | 1 refills | Status: DC

## 2019-03-01 MED ORDER — INFLUENZA VAC SPLIT QUAD 0.5 ML IM SUSY
0.5000 mL | PREFILLED_SYRINGE | Freq: Once | INTRAMUSCULAR | Status: AC
  Administered 2019-03-01: 0.5 mL via INTRAMUSCULAR

## 2019-03-01 MED ORDER — HEPARIN SOD (PORK) LOCK FLUSH 100 UNIT/ML IV SOLN
500.0000 [IU] | Freq: Once | INTRAVENOUS | Status: AC | PRN
  Administered 2019-03-01: 500 [IU]
  Filled 2019-03-01: qty 5

## 2019-03-01 MED ORDER — SODIUM CHLORIDE 0.9% FLUSH
10.0000 mL | INTRAVENOUS | Status: DC | PRN
  Administered 2019-03-01: 09:00:00 10 mL
  Filled 2019-03-01: qty 10

## 2019-03-01 NOTE — Patient Instructions (Signed)
[facility] Discharge Instructions for Patients Receiving Chemotherapy  Today you received the following chemotherapy agents:  Keytruda.  To help prevent nausea and vomiting after your treatment, we encourage you to take your nausea medication as directed.   If you develop nausea and vomiting that is not controlled by your nausea medication, call the clinic.   BELOW ARE SYMPTOMS THAT SHOULD BE REPORTED IMMEDIATELY:  *FEVER GREATER THAN 100.5 F  *CHILLS WITH OR WITHOUT FEVER  NAUSEA AND VOMITING THAT IS NOT CONTROLLED WITH YOUR NAUSEA MEDICATION  *UNUSUAL SHORTNESS OF BREATH  *UNUSUAL BRUISING OR BLEEDING  TENDERNESS IN MOUTH AND THROAT WITH OR WITHOUT PRESENCE OF ULCERS  *URINARY PROBLEMS  *BOWEL PROBLEMS  UNUSUAL RASH Items with * indicate a potential emergency and should be followed up as soon as possible.  Feel free to call the clinic should you have any questions or concerns. The clinic phone number is [phone].  Please show the CHEMO ALERT CARD at check-in to the Emergency Department and triage nurse.    

## 2019-03-01 NOTE — Assessment & Plan Note (Addendum)
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 Neoadj chemo 12/24/14- 04/29/15 AC x 4 foll by Abraxane X 12 Rt Lumpectomy: Path CR 0/2 LN Adj XRT 07/23/15- 09/08/15 PET/CT scan 11/17/2016:Subcutaneous nodules in the neck, upper back, left arm, abdomen and pelvis Patient progressed on Xeloda January 2019-07/04/2017 stopped due to progression of disease Cerebellar mass diagnosed 07/12/2016: Resection followed by stereotactic radiation 07/29/2016 Lymph node from 03/02/2018 biopsy: PDL1+ ------------------------------------------------------------------------------------------------------------------------------------------------ Current treatment:Pembrolizumab given every 3 weeks starting 04/03/18, today cycle 17 Keytruda toxicities:Hypothyroidism for which she required thyroid replacement therapy Currently on 115mg of Synthroid.  TSH level: 5.825 on 01/18/2019  Keytruda toxicities: 1.Severe scalp pain along the surgical scars very intense lasting for 48 hours improved with Aleve.Takes gabapentin, applies triamcinolone ointment 2.Right eye dryness: dexamethasone eyedrops. 3.Headaches: Brain MRI 11/07/2018: Unchanged from before. 3 mm enhancing nodule in the right lateral cerebellum which was determined to be scar tissue by brain tumor board. 4.Hypothyroidism:Synthroid1275m.Today's TSH is 5.8 so we will keep the dosage the same.  CT CAP: 12/06/2018:Complete resolution of metastatic focus in the right inguinal/groin region as well as complete resolution of the left supraclavicular lymphadenopathy. Stable 6 mm hypodense lesion in the liver. No current evidence of active malignancy.  Brain MRI8/22/2020:Stable 3 mm nodule Left hip discomfort: We will order a left hip MRI to evaluate  this further.  I suspect nerve compression or musculoskeletal in origin.  Return to clinic every 3 weeks for pembrolizumab

## 2019-03-02 ENCOUNTER — Telehealth: Payer: Self-pay | Admitting: Hematology and Oncology

## 2019-03-02 NOTE — Telephone Encounter (Signed)
Informed her about MRI Hip. No abnormalities noted

## 2019-03-02 NOTE — Telephone Encounter (Signed)
I could not reach patient regarding schedule  °

## 2019-03-03 ENCOUNTER — Other Ambulatory Visit: Payer: BC Managed Care – PPO

## 2019-03-04 ENCOUNTER — Other Ambulatory Visit: Payer: BC Managed Care – PPO

## 2019-03-05 ENCOUNTER — Encounter: Payer: Self-pay | Admitting: *Deleted

## 2019-03-09 ENCOUNTER — Other Ambulatory Visit: Payer: Self-pay | Admitting: Hematology and Oncology

## 2019-03-15 ENCOUNTER — Other Ambulatory Visit: Payer: Self-pay | Admitting: Radiation Therapy

## 2019-03-15 DIAGNOSIS — C7949 Secondary malignant neoplasm of other parts of nervous system: Secondary | ICD-10-CM

## 2019-03-15 DIAGNOSIS — C7931 Secondary malignant neoplasm of brain: Secondary | ICD-10-CM

## 2019-03-19 ENCOUNTER — Telehealth: Payer: Self-pay | Admitting: Radiation Therapy

## 2019-03-19 ENCOUNTER — Other Ambulatory Visit: Payer: Self-pay | Admitting: Radiation Therapy

## 2019-03-19 NOTE — Telephone Encounter (Signed)
Spoke with Kaitlyn Keith about her upcoming brain MRI and follow-up visit with [name]. She requested that the follow-up be a telephone only, not a video, MyChart or conference call. I told her that she may receive a telephone reminder call about this follow-up visit, but to remember that she does not need to come in person.   Mont Dutton R.T.(R)(T) Radiation Special Procedures Navigator

## 2019-03-22 NOTE — Progress Notes (Signed)
Patient Care Team: [name], MD as PCP - General (Hematology and Oncology) [name], [name], [name], [name], [name], [name], NP (Inactive) as Nurse Practitioner (Nurse Practitioner)  DIAGNOSIS:    ICD-10-CM   1. 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, estrogen receptor negative ([place])  C50.511    Z17.1     SUMMARY OF ONCOLOGIC HISTORY: Oncology History  Breast cancer of lower-outer quadrant of right female breast ([place])  12/03/2014 Mammogram   Right breast mass 1.9 cm it o'clock position 8 cm depth from the nipple   12/03/2014 Initial Diagnosis   Right breast biopsy 8:00: Invasive ductal carcinoma, grade 3, ER 0%, PR 0%, Ki-67 90%, HER-2 negative ratio 1.43   12/10/2014 Breast MRI   Right breast lower outer quadrant: 2.3 x 2.4 x 2.4 cm rim-enhancing mass abuts the pectoralis fascia but no enhancement of pectoralis muscle, second focus of artifact?'s second tissue marker clip, no lymph nodes   12/10/2014 Clinical Stage   Stage IIA: T2 N0   12/24/2014 - 04/29/2015 Neo-Adjuvant Chemotherapy   Dose dense Adriamycin and Cytoxan 4 followed by weekly Abraxane 12   05/02/2015 Breast MRI   complete radiologic response   06/16/2015 Surgery   Left Lumpectomy: Complete path Response, 0/2 LN   06/16/2015 Pathologic Stage   ypT0 ypN0   07/23/2015 - 09/05/2015 Radiation Therapy   Adjuvant RT: 50.4 Gy in 28 fractions and a boost of 10 Gy in 5 fractions to total dose of 60.4 Gy   10/24/2015 Survivorship   SCP mailed to patient in lieu of in person visit.   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 in BARD 1, BRCA2 and NF1   12/31/2016 Miscellaneous   Everolimus 10 mg daily for cycle 1 if she cannot tolerate will decrease to 7.5 mg daily   05/24/2017 - 07/04/2017 Chemotherapy   Xeloda 2000 mg 2 weeks on 1 week off stopped due to progression of disease based on CT scans done 06/27/2017   07/25/2017 - 03/13/2018 Chemotherapy   Halaven days 1 and 8 every 3 weeks stopped for progression    03/20/2018 - 03/31/2018 Radiation Therapy   Radiation to lymph nodes   04/03/2018 -  Chemotherapy   Keytruda every 3 weeks      CHIEF COMPLIANT: Follow-up of metastatic breast canceronKeytruda  INTERVAL HISTORY: Kaitlyn Keith is a 53 y.o. with above-mentioned history of metastatic breast cancer who is currently receiving pembrolizumab immunotherapyevery 3 weeks.Left hip MRI on 03/01/19 showed no evidence of abnormalities or malignancy. She presents to the clinic todayfor treatment.  REVIEW OF SYSTEMS:  Constitutional: Denies fevers, chills or abnormal weight loss Eyes: Denies blurriness of vision Ears, nose, mouth, throat, and face: Denies mucositis or sore throat Respiratory: Denies cough, dyspnea or wheezes Cardiovascular: Denies palpitation, chest discomfort Gastrointestinal: Denies nausea, heartburn or change in bowel habits Skin: Denies  abnormal skin rashes Lymphatics: Denies new lymphadenopathy or easy bruising Neurological: Denies numbness, tingling or new weaknesses Behavioral/Psych: Mood is stable, no new changes  Extremities: No lower extremity edema Breast: denies any pain or lumps or nodules in either breasts All other systems were reviewed with the patient and are negative.  I have reviewed the past medical history, past surgical history, social history and family history with the patient and they are unchanged from previous note.  ALLERGIES:  has No Known Allergies.  MEDICATIONS:  Current Outpatient Medications  Medication Sig Dispense Refill  . alprazolam (XANAX) 2 MG tablet Take 1 tablet (2 mg total) by mouth at bedtime. 30 tablet 5  . betamethasone valerate ointment (VALISONE) 0.1 % Apply 1 application topically 2 (two) times daily. 30 g 1  . cyclobenzaprine (FLEXERIL) 5 MG tablet Take 1 tablet (5 mg total) by mouth 3 (three) times daily as needed for muscle spasms. 30 tablet 0  . dexamethasone (DECADRON) 0.1 % ophthalmic suspension Place 1 drop into the right eye every 8 (eight) hours as needed. 5 mL 0  . levothyroxine (SYNTHROID) 125 MCG tablet TAKE 1 TABLET BY MOUTH EVERY DAY BEFORE BREAKFAST 90 tablet 0  . lidocaine-prilocaine (EMLA) cream APPLY TO AFFECTED AREA ONCE AS DIRECTED 30 g 3  . lisinopril-hydrochlorothiazide (PRINZIDE,ZESTORETIC) 20-12.5 MG tablet Take 1 tablet by mouth 2 (two) times daily. 180 tablet 3  . phentermine 15 MG capsule Take 1 capsule (15 mg total) by mouth every morning. 30 capsule 1  . traMADol (ULTRAM) 50 MG tablet Take 1 tablet (50 mg total) by mouth every 12 (twelve) hours as needed for moderate pain. 30 tablet 0  . triamcinolone ointment (KENALOG) 0.5 % APPLY TO AFFECTED AREA TWICE A DAY 30 g 0   No current facility-administered medications for this visit.     PHYSICAL EXAMINATION: ECOG PERFORMANCE STATUS: 1 - Symptomatic but completely ambulatory  Vitals:   03/23/19 0933   BP: 119/88  Pulse: 72  Resp: 17  Temp: 98.2 F (36.8 C)  SpO2: 99%   Filed Weights   03/23/19 0933  Weight: 183 lb 9.6 oz (83.3 kg)    GENERAL: alert, no distress and comfortable SKIN: skin color, texture, turgor are normal, no rashes or significant lesions EYES: normal, Conjunctiva are pink and non-injected, sclera clear OROPHARYNX: no exudate, no erythema and lips, buccal mucosa, and tongue normal  NECK: supple, thyroid normal size, non-tender, without nodularity LYMPH: no palpable lymphadenopathy in the cervical, axillary or inguinal LUNGS: clear to auscultation and percussion with normal breathing effort HEART: regular rate & rhythm and no murmurs and no lower extremity edema ABDOMEN: abdomen soft, non-tender and normal bowel sounds MUSCULOSKELETAL: no cyanosis of digits and no clubbing  NEURO: alert & oriented x 3 with fluent speech, no focal motor/sensory deficits EXTREMITIES: No lower extremity edema  LABORATORY DATA:  I have reviewed the data as listed CMP Latest Ref Rng & Units 03/01/2019 02/08/2019 01/18/2019  Glucose 70 - 99 mg/dL 115(H) 96 85  BUN 6 - 20 mg/dL '13 14 10  ' Creatinine 0.44 - 1.00 mg/dL 0.76 0.78 0.77  Sodium 135 - 145 mmol/L 140 140 140  Potassium 3.5 - 5.1 mmol/L 3.5 3.7 3.7  Chloride  98 - 111 mmol/L 107 105 106  CO2 22 - 32 mmol/L '26 27 26  ' Calcium 8.9 - 10.3 mg/dL 8.6(L) 8.9 8.7(L)  Total Protein 6.5 - 8.1 g/dL 6.9 7.3 7.1  Total Bilirubin 0.3 - 1.2 mg/dL 0.5 0.6 1.0  Alkaline Phos 38 - 126 U/L 91 87 82  AST 15 - 41 U/L '16 20 19  ' ALT 0 - 44 U/L '10 10 10    ' Lab Results  Component Value Date   WBC 4.2 03/23/2019   HGB 14.4 03/23/2019   HCT 43.3 03/23/2019   MCV 88.5 03/23/2019   PLT 182 03/23/2019   NEUTROABS 2.6 03/23/2019    ASSESSMENT & PLAN:  Breast cancer of lower-outer quadrant of right female breast ([place]) Right breast biopsy 12/03/2014 8:00: Invasive ductal carcinoma, grade 3, ER 0%, PR 0%, Ki-67 90%, HER-2 negative ratio 1.43,  2.4 cm by MRI in 1.9 cm by ultrasound T2 N0 M0 stage II a clinical stage abuts the pectoralis muscle no lymph nodes by MRI. Neoadj chemo 12/24/14- 04/29/15 AC x 4 foll by Abraxane X 12 Rt Lumpectomy: Path CR 0/2 LN Adj XRT 07/23/15- 09/08/15 PET/CT scan 11/17/2016:Subcutaneous nodules in the neck, upper back, left arm, abdomen and pelvis Patient progressed on Xeloda January 2019-07/04/2017 stopped due to progression of disease Cerebellar mass diagnosed 07/12/2016: Resection followed by stereotactic radiation 07/29/2016 Lymph node from 03/02/2018 biopsy: PDL1+ ------------------------------------------------------------------------------------------------------------------------------------------------ Current treatment:Pembrolizumab given every 3 weeks starting 04/03/18, today cycle 18 Keytruda toxicities:Hypothyroidism for which she required thyroid replacement therapy Currently on 139mg of Synthroid.  TSH level:5.825 on 01/18/2019  Keytruda toxicities: 1.Severe scalp pain along the surgical scars very intense lasting for 48 hours improved with Aleve.Takes gabapentin, applies triamcinolone ointment 2.Right eye dryness: dexamethasone eyedrops. 3.Headaches: Brain MRI 11/07/2018: Unchanged from before. 3 mm enhancing nodule in the right lateral cerebellum which was determined to be scar tissue by brain tumor board. 4.Hypothyroidism:Synthroid1231m.Today's TSH is 5.8 so we will keep the dosage the same.  CT CAP: 12/06/2018:Complete resolution of metastatic focus in the right inguinal/groin region as well as complete resolution of the left supraclavicular lymphadenopathy. Stable 6 mm hypodense lesion in the liver. No current evidence of active malignancy.  Brain MRI8/22/2020:Stable 3 mm nodule Left hip discomfort: She is getting an MRI of the left hip  Weight gain: On phentermine: She is losing weight steadily. Our neck scans will be done in January 2021.  Return to clinic  every 3 weeks for pembrolizumab    No orders of the defined types were placed in this encounter.  The patient has a good understanding of the overall plan. she agrees with it. she will call with any problems that may develop before the next visit here.  GuNicholas LoseMD 03/23/2019  I,Julious Okaorshimer am acting as scribe for Dr. ViNicholas Lose I have reviewed the above documentation for accuracy and completeness, and I agree with the above.

## 2019-03-23 ENCOUNTER — Inpatient Hospital Stay: Payer: BC Managed Care – PPO

## 2019-03-23 ENCOUNTER — Inpatient Hospital Stay (HOSPITAL_BASED_OUTPATIENT_CLINIC_OR_DEPARTMENT_OTHER): Payer: BC Managed Care – PPO | Admitting: Hematology and Oncology

## 2019-03-23 ENCOUNTER — Telehealth: Payer: Self-pay

## 2019-03-23 ENCOUNTER — Telehealth: Payer: Self-pay | Admitting: Hematology and Oncology

## 2019-03-23 ENCOUNTER — Other Ambulatory Visit: Payer: Self-pay

## 2019-03-23 DIAGNOSIS — Z5112 Encounter for antineoplastic immunotherapy: Secondary | ICD-10-CM | POA: Diagnosis not present

## 2019-03-23 DIAGNOSIS — Z171 Estrogen receptor negative status [ER-]: Secondary | ICD-10-CM

## 2019-03-23 DIAGNOSIS — C50511 Malignant neoplasm of lower-outer quadrant of right female breast: Secondary | ICD-10-CM

## 2019-03-23 LAB — CMP (CANCER CENTER ONLY)
ALT: 11 U/L (ref 0–44)
AST: 19 U/L (ref 15–41)
Albumin: 4 g/dL (ref 3.5–5.0)
Alkaline Phosphatase: 108 U/L (ref 38–126)
Anion gap: 10 (ref 5–15)
BUN: 16 mg/dL (ref 6–20)
CO2: 26 mmol/L (ref 22–32)
Calcium: 9.1 mg/dL (ref 8.9–10.3)
Chloride: 103 mmol/L (ref 98–111)
Creatinine: 0.88 mg/dL (ref 0.44–1.00)
GFR, Est AFR Am: >60 mL/min (ref >60)
GFR, Estimated: >60 mL/min (ref >60)
Glucose, Bld: 92 mg/dL (ref 70–99)
Potassium: 3.5 mmol/L (ref 3.5–5.1)
Sodium: 139 mmol/L (ref 135–145)
Total Bilirubin: 0.7 mg/dL (ref 0.3–1.2)
Total Protein: 8 g/dL (ref 6.5–8.1)

## 2019-03-23 LAB — CBC WITH DIFFERENTIAL (CANCER CENTER ONLY)
Abs Immature Granulocytes: 0.01 10*3/uL (ref 0.00–0.07)
Basophils Absolute: 0 10*3/uL (ref 0.0–0.1)
Basophils Relative: 1 %
Eosinophils Absolute: 0.1 10*3/uL (ref 0.0–0.5)
Eosinophils Relative: 1 %
HCT: 43.3 % (ref 36.0–46.0)
Hemoglobin: 14.4 g/dL (ref 12.0–15.0)
Immature Granulocytes: 0 %
Lymphocytes Relative: 27 %
Lymphs Abs: 1.1 10*3/uL (ref 0.7–4.0)
MCH: 29.4 pg (ref 26.0–34.0)
MCHC: 33.3 g/dL (ref 30.0–36.0)
MCV: 88.5 fL (ref 80.0–100.0)
Monocytes Absolute: 0.4 10*3/uL (ref 0.1–1.0)
Monocytes Relative: 9 %
Neutro Abs: 2.6 10*3/uL (ref 1.7–7.7)
Neutrophils Relative %: 62 %
Platelet Count: 182 10*3/uL (ref 150–400)
RBC: 4.89 MIL/uL (ref 3.87–5.11)
RDW: 12.5 % (ref 11.5–15.5)
WBC Count: 4.2 10*3/uL (ref 4.0–10.5)
nRBC: 0 % (ref 0.0–0.2)

## 2019-03-23 LAB — TSH: TSH: 12.082 u[IU]/mL — ABNORMAL HIGH (ref 0.308–3.960)

## 2019-03-23 MED ORDER — SODIUM CHLORIDE 0.9 % IV SOLN
Freq: Once | INTRAVENOUS | Status: AC
  Administered 2019-03-23: 10:00:00 via INTRAVENOUS
  Filled 2019-03-23: qty 250

## 2019-03-23 MED ORDER — SODIUM CHLORIDE 0.9 % IV SOLN
200.0000 mg | Freq: Once | INTRAVENOUS | Status: AC
  Administered 2019-03-23: 200 mg via INTRAVENOUS
  Filled 2019-03-23: qty 8

## 2019-03-23 MED ORDER — SODIUM CHLORIDE 0.9% FLUSH
10.0000 mL | INTRAVENOUS | Status: DC | PRN
  Administered 2019-03-23: 10 mL
  Filled 2019-03-23: qty 10

## 2019-03-23 MED ORDER — HEPARIN SOD (PORK) LOCK FLUSH 100 UNIT/ML IV SOLN
500.0000 [IU] | Freq: Once | INTRAVENOUS | Status: AC | PRN
  Administered 2019-03-23: 12:00:00 500 [IU]
  Filled 2019-03-23: qty 5

## 2019-03-23 NOTE — Telephone Encounter (Signed)
RN successfully faxed disability paperwork to [phone].   Original copy [place] [address] per patient request.   Copy mailed to patient.    Pt notified, voiced appreciation.

## 2019-03-23 NOTE — Assessment & Plan Note (Signed)
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 Neoadj chemo 12/24/14- 04/29/15 AC x 4 foll by Abraxane X 12 Rt Lumpectomy: Path CR 0/2 LN Adj XRT 07/23/15- 09/08/15 PET/CT scan 11/17/2016:Subcutaneous nodules in the neck, upper back, left arm, abdomen and pelvis Patient progressed on Xeloda January 2019-07/04/2017 stopped due to progression of disease Cerebellar mass diagnosed 07/12/2016: Resection followed by stereotactic radiation 07/29/2016 Lymph node from 03/02/2018 biopsy: PDL1+ ------------------------------------------------------------------------------------------------------------------------------------------------ Current treatment:Pembrolizumab given every 3 weeks starting 04/03/18, today cycle 17 Keytruda toxicities:Hypothyroidism for which she required thyroid replacement therapy Currently on 151mg of Synthroid.  TSH level:5.825 on 01/18/2019  Keytruda toxicities: 1.Severe scalp pain along the surgical scars very intense lasting for 48 hours improved with Aleve.Takes gabapentin, applies triamcinolone ointment 2.Right eye dryness: dexamethasone eyedrops. 3.Headaches: Brain MRI 11/07/2018: Unchanged from before. 3 mm enhancing nodule in the right lateral cerebellum which was determined to be scar tissue by brain tumor board. 4.Hypothyroidism:Synthroid1213m.Today's TSH is 5.8 so we will keep the dosage the same.  CT CAP: 12/06/2018:Complete resolution of metastatic focus in the right inguinal/groin region as well as complete resolution of the left supraclavicular lymphadenopathy. Stable 6 mm hypodense lesion in the liver. No current evidence of active malignancy.  Brain MRI8/22/2020:Stable 3 mm nodule Left hip discomfort: She is getting an MRI of the left  hip  Weight gain: I sent a prescription for phentermine.  We discussed the risks and benefits.  I also discussed the abuse and addiction potential.  We will use it for very short period of time for up to 2 months.  I gave her a low-dose of 15 mg.  Return to clinic every 3 weeks for pembrolizumab

## 2019-03-23 NOTE — Telephone Encounter (Signed)
Per 10/30 no los 

## 2019-03-23 NOTE — Patient Instructions (Signed)
[facility] Discharge Instructions for Patients Receiving Chemotherapy  Today you received the following chemotherapy agents:  Keytruda.  To help prevent nausea and vomiting after your treatment, we encourage you to take your nausea medication as directed.   If you develop nausea and vomiting that is not controlled by your nausea medication, call the clinic.   BELOW ARE SYMPTOMS THAT SHOULD BE REPORTED IMMEDIATELY:  *FEVER GREATER THAN 100.5 F  *CHILLS WITH OR WITHOUT FEVER  NAUSEA AND VOMITING THAT IS NOT CONTROLLED WITH YOUR NAUSEA MEDICATION  *UNUSUAL SHORTNESS OF BREATH  *UNUSUAL BRUISING OR BLEEDING  TENDERNESS IN MOUTH AND THROAT WITH OR WITHOUT PRESENCE OF ULCERS  *URINARY PROBLEMS  *BOWEL PROBLEMS  UNUSUAL RASH Items with * indicate a potential emergency and should be followed up as soon as possible.  Feel free to call the clinic should you have any questions or concerns. The clinic phone number is [phone].  Please show the CHEMO ALERT CARD at check-in to the Emergency Department and triage nurse.    

## 2019-04-12 NOTE — Progress Notes (Signed)
Patient Care Team: [name], MD as PCP - General (Hematology and Oncology) [name], [name], [name], [name], [name], [name], NP (Inactive) as Nurse Practitioner (Nurse Practitioner)  DIAGNOSIS:    ICD-10-CM   1. 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%, HER-2 negative ratio 1.43   12/10/2014 Breast MRI   Right breast lower outer quadrant: 2.3 x 2.4 x 2.4 cm rim-enhancing mass abuts the pectoralis fascia but no enhancement of pectoralis muscle, second focus of artifact?'s second tissue marker clip, no lymph nodes   12/10/2014 Clinical Stage   Stage IIA: T2 N0   12/24/2014 - 04/29/2015 Neo-Adjuvant Chemotherapy   Dose dense Adriamycin and Cytoxan 4 followed by weekly Abraxane 12   05/02/2015 Breast MRI   complete radiologic response   06/16/2015 Surgery   Left Lumpectomy: Complete path Response, 0/2 LN   06/16/2015 Pathologic Stage   ypT0 ypN0   07/23/2015 - 09/05/2015 Radiation Therapy   Adjuvant RT: 50.4 Gy in 28 fractions and a boost of 10 Gy in 5 fractions to total dose of 60.4 Gy   10/24/2015 Survivorship   SCP mailed to patient in lieu of in person visit.   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 in BARD 1, BRCA2 and NF1   12/31/2016 Miscellaneous   Everolimus 10 mg daily for cycle 1 if she cannot tolerate will decrease to 7.5 mg daily   05/24/2017 - 07/04/2017 Chemotherapy   Xeloda 2000 mg 2 weeks on 1 week off stopped due to progression of disease based on CT scans done 06/27/2017   07/25/2017 - 03/13/2018 Chemotherapy   [place] days 1 and 8 every 3 weeks stopped for progression    03/20/2018 - 03/31/2018 Radiation Therapy   Radiation to lymph nodes   04/03/2018 -  Chemotherapy   Keytruda every 3 weeks      CHIEF COMPLIANT: Follow-up of metastatic breast canceronKeytruda  INTERVAL HISTORY: Kaitlyn Keith is a 53 y.o. with above-mentioned history of metastatic breast cancer who is currently receiving pembrolizumab immunotherapyevery 3 weeks. She presents to the clinic todayfor treatment.  She reports intermittent headaches.  Denies any nausea or vomiting.  Denies any GI upset.  REVIEW OF SYSTEMS:   Constitutional: Denies fevers, chills or abnormal weight loss Eyes: Denies blurriness of vision Ears, nose, mouth, throat, and face: Denies mucositis or sore throat Respiratory: Denies cough, dyspnea or wheezes Cardiovascular: Denies palpitation, chest discomfort Gastrointestinal: Denies nausea, heartburn or change in bowel habits  Skin: Denies abnormal skin rashes Lymphatics: Denies new lymphadenopathy or easy bruising Neurological: Denies numbness, tingling or new weaknesses Behavioral/Psych: Mood is stable, no new changes  Extremities: No lower extremity edema Breast: denies any pain or lumps or nodules in either breasts All other systems were reviewed with the patient and are negative.  I have reviewed the past medical history, past surgical history, social history and family history with the patient and they are unchanged from previous note.  ALLERGIES:  has No Known Allergies.  MEDICATIONS:  Current Outpatient Medications  Medication Sig Dispense Refill  . alprazolam (XANAX) 2 MG tablet Take 1 tablet (2 mg total) by mouth at bedtime. 30 tablet 5  . betamethasone valerate ointment (VALISONE) 0.1 % Apply 1 application topically 2 (two) times daily. 30 g 1  . cyclobenzaprine (FLEXERIL) 5 MG tablet Take 1 tablet (5 mg total) by mouth 3 (three) times daily as needed for muscle spasms. 30 tablet 0  . dexamethasone (DECADRON) 0.1 % ophthalmic suspension Place 1 drop into the right eye every 8 (eight) hours as needed. 5 mL 0  . levothyroxine (SYNTHROID) 125 MCG tablet TAKE 1 TABLET BY MOUTH EVERY DAY BEFORE BREAKFAST 90 tablet 0  . lidocaine-prilocaine (EMLA) cream APPLY TO AFFECTED AREA ONCE AS DIRECTED 30 g 3  . lisinopril-hydrochlorothiazide (PRINZIDE,ZESTORETIC) 20-12.5 MG tablet Take 1 tablet by mouth 2 (two) times daily. 180 tablet 3  . phentermine 15 MG capsule Take 1 capsule (15 mg total) by mouth every morning. 30 capsule 1  . traMADol (ULTRAM) 50 MG tablet Take 1 tablet (50 mg total) by mouth every 12 (twelve) hours as needed for moderate pain. 30 tablet 0  . triamcinolone ointment (KENALOG) 0.5 % APPLY TO AFFECTED AREA TWICE A DAY 30 g 0   No current facility-administered medications for this visit.    Facility-Administered Medications Ordered in Other Visits  Medication Dose Route Frequency Provider Last  Rate Last Dose  . sodium chloride flush (NS) 0.9 % injection 10 mL  10 mL Intracatheter PRN [name], MD   10 mL at 04/13/19 0946    PHYSICAL EXAMINATION: ECOG PERFORMANCE STATUS: 1 - Symptomatic but completely ambulatory  There were no vitals filed for this visit. There were no vitals filed for this visit.  GENERAL: alert, no distress and comfortable SKIN: skin color, texture, turgor are normal, no rashes or significant lesions EYES: normal, Conjunctiva are pink and non-injected, sclera clear OROPHARYNX: no exudate, no erythema and lips, buccal mucosa, and tongue normal  NECK: supple, thyroid normal size, non-tender, without nodularity LYMPH: no palpable lymphadenopathy in the cervical, axillary or inguinal LUNGS: clear to auscultation and percussion with normal breathing effort HEART: regular rate & rhythm and no murmurs and no lower extremity edema ABDOMEN: abdomen soft, non-tender and normal bowel sounds MUSCULOSKELETAL: no cyanosis of digits and no clubbing  NEURO: alert & oriented x 3 with fluent speech, no focal motor/sensory deficits EXTREMITIES: No lower extremity edema  LABORATORY DATA:  I have reviewed the data as listed CMP Latest Ref Rng & Units 03/23/2019 03/01/2019 02/08/2019  Glucose 70 - 99 mg/dL 92 115(H) 96  BUN 6 - 20 mg/dL _0 Creatinine 0.44 - 1.00  mg/dL 0.88 0.76 0.78  Sodium 135 - 145 mmol/L 139 140 140  Potassium 3.5 - 5.1 mmol/L 3.5 3.5 3.7  Chloride 98 - 111 mmol/L 103 107 105  CO2 22 - 32 mmol/L _0 Calcium 8.9 - 10.3 mg/dL 9.1 8.6(L) 8.9  Total Protein 6.5 - 8.1 g/dL 8.0 6.9 7.3  Total Bilirubin 0.3 - 1.2 mg/dL 0.7 0.5 0.6  Alkaline Phos 38 - 126 U/L 108 91 87  AST 15 - 41 U/L _1 ALT 0 - 44 U/L _2 Lab Results  Component Value Date   WBC 3.6 (L) 04/13/2019   HGB 13.2 04/13/2019   HCT 40.7 04/13/2019   MCV 89.8 04/13/2019   PLT 176 04/13/2019   NEUTROABS 2.1 04/13/2019    ASSESSMENT & PLAN:  Breast cancer of  lower-outer quadrant of right female breast ([place]) Right breast biopsy 12/03/2014 8:00: Invasive ductal carcinoma, grade 3, ER 0%, PR 0%, 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 Neoadj chemo 12/24/14- 04/29/15 AC x 4 foll by Abraxane X 12 Rt Lumpectomy: Path CR 0/2 LN Adj XRT 07/23/15- 09/08/15 PET/CT scan 11/17/2016:Subcutaneous nodules in the neck, upper back, left arm, abdomen and pelvis Patient progressed on Xeloda January 2019-07/04/2017 stopped due to progression of disease Cerebellar mass diagnosed 07/12/2016: Resection followed by stereotactic radiation 07/29/2016 Lymph node from 03/02/2018 biopsy: PDL1+ ------------------------------------------------------------------------------------------------------------------------------------------------ Current treatment:Pembrolizumab given every 3 weeks starting 04/03/18, today cycle 19 Keytruda toxicities:Hypothyroidism for which she required thyroid replacement therapy Currently on 176mg of Synthroid.  TSH level:5.825 on 01/18/2019  Keytruda toxicities: 1.Severe scalp pain along the surgical scars very intense lasting for 48 hours improved with Aleve.Takes gabapentin, applies triamcinolone ointment 2.Right eye dryness: dexamethasone eyedrops. 3.Headaches: Brain MRI 04/14/2019. 4.Hypothyroidism:Synthroid1276m.   CT CAP: 12/06/2018:Complete resolution of metastatic focus in the right inguinal/groin region as well as complete resolution of the left supraclavicular lymphadenopathy. Stable 6 mm hypodense lesion in the liver. No current evidence of active malignancy.  Brain MRI8/22/2020:Stable 3 mm nodule  Brain MRI will be done 04/14/2019    Weight gain: On phentermine: She is losing weight steadily. Our next scans will be done in January 2021.    No orders of the defined types were placed in  this encounter.  The patient has a good understanding of the overall plan. she agrees with it. she will call with any problems that may develop before the next visit here.  GuNicholas LoseMD 04/13/2019  I,Julious Okaorshimer, am acting as scribe for Dr. ViNicholas Lose I have reviewed the above documentation for accuracy and completeness, and I agree with the above.

## 2019-04-13 ENCOUNTER — Other Ambulatory Visit: Payer: Self-pay

## 2019-04-13 ENCOUNTER — Inpatient Hospital Stay: Payer: BC Managed Care – PPO

## 2019-04-13 ENCOUNTER — Inpatient Hospital Stay (HOSPITAL_BASED_OUTPATIENT_CLINIC_OR_DEPARTMENT_OTHER): Payer: BC Managed Care – PPO | Admitting: Hematology and Oncology

## 2019-04-13 ENCOUNTER — Other Ambulatory Visit: Payer: Self-pay | Admitting: *Deleted

## 2019-04-13 ENCOUNTER — Inpatient Hospital Stay: Payer: BC Managed Care – PPO | Attending: Hematology and Oncology

## 2019-04-13 DIAGNOSIS — Z171 Estrogen receptor negative status [ER-]: Secondary | ICD-10-CM

## 2019-04-13 DIAGNOSIS — R519 Headache, unspecified: Secondary | ICD-10-CM | POA: Insufficient documentation

## 2019-04-13 DIAGNOSIS — E039 Hypothyroidism, unspecified: Secondary | ICD-10-CM | POA: Diagnosis not present

## 2019-04-13 DIAGNOSIS — Z79899 Other long term (current) drug therapy: Secondary | ICD-10-CM | POA: Diagnosis not present

## 2019-04-13 DIAGNOSIS — Z5112 Encounter for antineoplastic immunotherapy: Secondary | ICD-10-CM | POA: Diagnosis not present

## 2019-04-13 DIAGNOSIS — Z923 Personal history of irradiation: Secondary | ICD-10-CM | POA: Insufficient documentation

## 2019-04-13 DIAGNOSIS — C792 Secondary malignant neoplasm of skin: Secondary | ICD-10-CM

## 2019-04-13 DIAGNOSIS — C7931 Secondary malignant neoplasm of brain: Secondary | ICD-10-CM | POA: Insufficient documentation

## 2019-04-13 DIAGNOSIS — C50511 Malignant neoplasm of lower-outer quadrant of right female breast: Secondary | ICD-10-CM

## 2019-04-13 LAB — TSH: TSH: 11.613 u[IU]/mL — ABNORMAL HIGH (ref 0.308–3.960)

## 2019-04-13 LAB — CBC WITH DIFFERENTIAL (CANCER CENTER ONLY)
Abs Immature Granulocytes: 0.01 10*3/uL (ref 0.00–0.07)
Basophils Absolute: 0 10*3/uL (ref 0.0–0.1)
Basophils Relative: 1 %
Eosinophils Absolute: 0.1 10*3/uL (ref 0.0–0.5)
Eosinophils Relative: 1 %
HCT: 40.7 % (ref 36.0–46.0)
Hemoglobin: 13.2 g/dL (ref 12.0–15.0)
Immature Granulocytes: 0 %
Lymphocytes Relative: 32 %
Lymphs Abs: 1.2 10*3/uL (ref 0.7–4.0)
MCH: 29.1 pg (ref 26.0–34.0)
MCHC: 32.4 g/dL (ref 30.0–36.0)
MCV: 89.8 fL (ref 80.0–100.0)
Monocytes Absolute: 0.3 10*3/uL (ref 0.1–1.0)
Monocytes Relative: 8 %
Neutro Abs: 2.1 10*3/uL (ref 1.7–7.7)
Neutrophils Relative %: 58 %
Platelet Count: 176 10*3/uL (ref 150–400)
RBC: 4.53 MIL/uL (ref 3.87–5.11)
RDW: 12.4 % (ref 11.5–15.5)
WBC Count: 3.6 10*3/uL — ABNORMAL LOW (ref 4.0–10.5)
nRBC: 0 % (ref 0.0–0.2)

## 2019-04-13 LAB — CMP (CANCER CENTER ONLY)
ALT: 11 U/L (ref 0–44)
AST: 16 U/L (ref 15–41)
Albumin: 3.8 g/dL (ref 3.5–5.0)
Alkaline Phosphatase: 95 U/L (ref 38–126)
Anion gap: 8 (ref 5–15)
BUN: 13 mg/dL (ref 6–20)
CO2: 26 mmol/L (ref 22–32)
Calcium: 9 mg/dL (ref 8.9–10.3)
Chloride: 106 mmol/L (ref 98–111)
Creatinine: 0.75 mg/dL (ref 0.44–1.00)
GFR, Est AFR Am: >60 mL/min (ref >60)
GFR, Estimated: >60 mL/min (ref >60)
Glucose, Bld: 92 mg/dL (ref 70–99)
Potassium: 3.6 mmol/L (ref 3.5–5.1)
Sodium: 140 mmol/L (ref 135–145)
Total Bilirubin: 0.5 mg/dL (ref 0.3–1.2)
Total Protein: 7.2 g/dL (ref 6.5–8.1)

## 2019-04-13 MED ORDER — SODIUM CHLORIDE 0.9% FLUSH
10.0000 mL | INTRAVENOUS | Status: DC | PRN
  Administered 2019-04-13: 10 mL
  Filled 2019-04-13: qty 10

## 2019-04-13 MED ORDER — SODIUM CHLORIDE 0.9 % IV SOLN
Freq: Once | INTRAVENOUS | Status: AC
  Administered 2019-04-13: 11:00:00 via INTRAVENOUS
  Filled 2019-04-13: qty 250

## 2019-04-13 MED ORDER — LEVOTHYROXINE SODIUM 137 MCG PO TABS: 137.0000 ug | ORAL_TABLET | Freq: Every day | ORAL | 1 refills | Status: DC

## 2019-04-13 MED ORDER — SODIUM CHLORIDE 0.9 % IV SOLN
200.0000 mg | Freq: Once | INTRAVENOUS | Status: AC
  Administered 2019-04-13: 200 mg via INTRAVENOUS
  Filled 2019-04-13: qty 8

## 2019-04-13 MED ORDER — HEPARIN SOD (PORK) LOCK FLUSH 100 UNIT/ML IV SOLN
500.0000 [IU] | Freq: Once | INTRAVENOUS | Status: AC | PRN
  Administered 2019-04-13: 500 [IU]
  Filled 2019-04-13: qty 5

## 2019-04-13 NOTE — Assessment & Plan Note (Signed)
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 Neoadj chemo 12/24/14- 04/29/15 AC x 4 foll by Abraxane X 12 Rt Lumpectomy: Path CR 0/2 LN Adj XRT 07/23/15- 09/08/15 PET/CT scan 11/17/2016:Subcutaneous nodules in the neck, upper back, left arm, abdomen and pelvis Patient progressed on Xeloda January 2019-07/04/2017 stopped due to progression of disease Cerebellar mass diagnosed 07/12/2016: Resection followed by stereotactic radiation 07/29/2016 Lymph node from 03/02/2018 biopsy: PDL1+ ------------------------------------------------------------------------------------------------------------------------------------------------ Current treatment:Pembrolizumab given every 3 weeks starting 04/03/18, today cycle 19 Keytruda toxicities:Hypothyroidism for which she required thyroid replacement therapy Currently on 138mg of Synthroid.  TSH level:5.825 on 01/18/2019  Keytruda toxicities: 1.Severe scalp pain along the surgical scars very intense lasting for 48 hours improved with Aleve.Takes gabapentin, applies triamcinolone ointment 2.Right eye dryness: dexamethasone eyedrops. 3.Headaches: Brain MRI 11/07/2018: Unchanged from before. 3 mm enhancing nodule in the right lateral cerebellum which was determined to be scar tissue by brain tumor board. 4.Hypothyroidism:Synthroid1212m.Today's TSH is 5.8 so we will keep the dosage the same.  CT CAP: 12/06/2018:Complete resolution of metastatic focus in the right inguinal/groin region as well as complete resolution of the left supraclavicular lymphadenopathy. Stable 6 mm hypodense lesion in the liver. No current evidence of active malignancy.  Brain MRI8/22/2020:Stable 3 mm nodule Left hip discomfort:She is getting an MRI of the left  hip  Weight gain: On phentermine: She is losing weight steadily. Our next scans will be done in January 2021.

## 2019-04-13 NOTE — Progress Notes (Signed)
Per Dr. Lindi Adie, based on pt TSH today of 11.613 pt will need levothyroxine increased to 137 mcg daily.  Pt notified and prescription sent to pt pharmacy.

## 2019-04-13 NOTE — Patient Instructions (Signed)

## 2019-04-14 ENCOUNTER — Ambulatory Visit
Admission: RE | Admit: 2019-04-14 | Discharge: 2019-04-14 | Disposition: A | Discharge status: Home / Self Care | Payer: BC Managed Care – PPO | Source: Ambulatory Visit | Attending: Radiation Oncology | Admitting: Radiation Oncology

## 2019-04-14 DIAGNOSIS — C7949 Secondary malignant neoplasm of other parts of nervous system: Secondary | ICD-10-CM

## 2019-04-14 DIAGNOSIS — C7931 Secondary malignant neoplasm of brain: Secondary | ICD-10-CM

## 2019-04-14 MED ORDER — GADOBENATE DIMEGLUMINE 529 MG/ML IV SOLN
17.0000 mL | Freq: Once | INTRAVENOUS | Status: AC | PRN
  Administered 2019-04-14: 17 mL via INTRAVENOUS

## 2019-04-17 ENCOUNTER — Other Ambulatory Visit: Payer: Self-pay

## 2019-04-17 ENCOUNTER — Encounter: Payer: Self-pay | Admitting: Urology

## 2019-04-18 ENCOUNTER — Ambulatory Visit
Admission: RE | Admit: 2019-04-18 | Discharge: 2019-04-18 | Disposition: A | Discharge status: Home / Self Care | Payer: BC Managed Care – PPO | Source: Ambulatory Visit | Attending: Urology | Admitting: Urology

## 2019-04-18 ENCOUNTER — Other Ambulatory Visit: Payer: Self-pay

## 2019-04-18 DIAGNOSIS — C7931 Secondary malignant neoplasm of brain: Secondary | ICD-10-CM

## 2019-04-18 NOTE — Progress Notes (Signed)
Radiation Oncology         [phone] ________________________________  Name: Kaitlyn Keith MRN: [id number]  Date: 04/18/2019  DOB: 06-Nov-1965  Follow-Up Visit Note  CC: [name], MD  Ditty, Kevan Ny, *  Diagnosis:    53 y.o. 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        ICD-10-CM   1. Secondary malignant neoplasm of brain and spinal cord (HCC)  C79.31    C79.49   2. 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 Pelvis / 30 Gy in 10 fractions (right inguinal nodes) 2. Left Supraclavicular / 30 Gy in 10 fractions 3. Abdomen / 20 Gy in 8 fractions (subcutaneous Met)  07/11/2017 - 07/22/2017 palliative XRT for painful subcutaneous nodules ([place]) 1. Left Flank / 30 Gy in 10 fractions 2. Left Groin / 30 Gy in 10 fractions  07/26/16 Preop SRS Treatment Tammi [facility]li[facility]): PTV1 Right Cerebellum was treated to 18 Gy in 1 fraction  07/23/15-09/05/15 ([place]): 50.4 Gy to the right breast + 10 Gy boost  Narrative:  I spoke with the patient to conduct her routine scheduled 3 month follow up visit to review recent MRI brain via telephone to spare the patient unnecessary potential exposure in the healthcare setting during the current COVID-19 pandemic.  The patient was notified in advance and gave permission to proceed with this visit format. Kaitlyn Keith is a pleasant 53 y.o. 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 She was diagnosed with her cancer in July 2016, and underwent neoadjuvant chemotherapy followed by right lumpectomy and adjuvant radiation. She was found to have recurrent disease in February 2018 with a right cerebellar mass, and  underwent preoperative SRS treatment followed by surgical resection on 07/29/2016.   She developed multiple subcutaneous nodules in the neck, upper back, left arm, abdomen and pelvis, noted on PET scan from 11/17/16. She was started on Everolimus on 12/31/16 but was discontinued 04/11/17 due to disease progression with interval development of subcutaneous nodules in the ventral lower left pelvic wall and left flank and interval growth of 3 scattered peritoneal metastases. She started Xeloda in 05/2017 but discontinued due to disease progression on re-staging scans from 06/27/17 which showed a mixed response to therapy with some regression of the previously noted intraperitoneal implants but other lesions with significant interval growth including subcutaneous nodules in the left flank, left vulvar region, right inguinal LAN and anterior mediastinal LAN. She elected to move forward with palliative XRT to 2 painful subcutaneous lesions in the left flank and left pubic/vulvar region which was completed in March 2019.  She tolerated radiotherapy very well and had an excellent response with decreased size of both treated lesions.  Her systemic chemotherapy was switched to [facility] but this was discontinued in October 2019 due to evidence of disease progression on follow-up CT C/A/P from 02/02/2018 indicating continued progression of right inguinal lymph node and right inguinal lymphadenopathy.  She underwent a CT-guided biopsy of the right inguinal lymph node on 03/02/2018 with final pathology confirming metastatic, poorly differentiated carcinoma, ER/PR negative and HER-2 negative.  Her systemic therapy was changed to pembrolizumab immunotherapy at that time and she was also  referred back to radiation oncology for consideration of palliative radiotherapy to the painful, progressive right inguinal lymphadenopathy.  At the time of her consult on 03/15/2018, she also mentioned that she had recently developed pain in the left  supraclavicular region as well as in the left flank area.  She elected to proceed with palliative radiotherapy to the 3 sites of painful metastatic disease in the right inguinal, left supraclavicular and left abdomen/flank and this was completed on 03/31/2018.  She tolerated the radiation well and did have significant improvement in her pain.  She has now completed 14 cycles of Keytruda (pembrolizumab) and her most recent CT C/A/P from 12/06/2018 showed complete resolution of metastatic focus in the right inguinal/groin region as well as complete resolution of the left supraclavicular lymphadenopathy. Stable 6 mm hypodense lesion in the liver. No current evidence of active malignancy. She has continued in routine follow up with Dr. Lindi Adie, last seen on 04/13/2019 and the recommendation is to continue with her current immunotherapy with [facility] every 3 weeks.  She has also continued being followed in our multidisciplinary brain tumor conference, and her last MRI scan on 04/14/19 was recently reviewed in brain tumor board this morning and revealed a stable 3 mm enhancing nodule in the right lateral cerebellum at the site of previous tumor treatment, felt to be consistent with treatment related changes especially in light of its stable appearance. unlikely to be disease recurrence. There were no other enhancing lesions. Today's visit is to review these findings.  On review of systems, the patient reports that she is doing well overall. She denies any chest pain, shortness of breath, cough, fevers, chills, night sweats, or unintended weight changes. She denies any bowel or bladder disturbances, and denies abdominal pain, nausea or vomiting. She denies new visual or auditory changes, dizziness, imbalance, tremors or seizure activity. She has continued having occasional headaches which respond to tylenol or advil if needed.  The headaches became severe back in 10/2018 and she ended up having a wisdom tooth extraction  with significant improvement of her HAs. She has some chronic pain in the left groin/hip joint region that radiates down her left leg anteriorly to her foot.  This pain is exacerbated with activities and improves with rest and does not wake her from sleep at night. An MRI of the hip in October 2020 was without evidence of bony metastatic disease or acute abnormalities.  She reports mild improvement with taking muscle relaxants prn. She denies any new skin lesions or concerns. A complete review of systems is obtained and is otherwise negative.  Past Medical History:  Past Medical History:  Diagnosis Date   Anxiety    Arthritis    Back pain    Brain cancer ([place])    brian met from triple negative breast ca   Breast cancer [facility])    Breast cancer of lower-outer quadrant of right female breast ([place]) 12/05/2014   Depression    FH: chemotherapy 12/2014-04/2015   Genetic testing 12/10/2016   Kaitlyn Keith underwent genetic counseling and testing for hereditary cancer syndromes on 11/18/2016. 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 Mutations in MSH6 are associated with a hereditary cancer syndrome called Lynch syndrome. For more detailed discussion, please see genetic counseling documentation from 12/10/2016.  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 Mutations in MSH6 are associated with a hereditary cancer syndrome called Lynch syndrome. For more detailed discussion, please see genetic counseling documentation from 12/10/2016.  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    Radiation 07/23/15-09/05/15   right breast 50.4 Gy, boost of 10 Gy    Past Surgical History: Past Surgical History:  Procedure Laterality Date   APPLICATION OF CRANIAL NAVIGATION Right 07/27/2016   Procedure: APPLICATION OF CRANIAL NAVIGATION;  Surgeon: Kevan Ny Ditty, MD;  Location: [place];  Service: Neurosurgery;  Laterality: Right;   BIOPSY OF SKIN SUBCUTANEOUS TISSUE AND/OR MUCOUS MEMBRANE Left 12/24/2016   Procedure: OPEN BIOPSY LESIONS ON LEFT LOWER BACK AND SHOULDER BLADE;  Surgeon: [name], MD;  Location: [place];  Service: General;  Laterality: Left;   BREAST LUMPECTOMY WITH NEEDLE LOCALIZATION AND AXILLARY SENTINEL LYMPH NODE BX Right 06/16/2015   Procedure: BREAST LUMPECTOMY WITH NEEDLE LOCALIZATION AND AXILLARY SENTINEL LYMPH NODE BX;  Surgeon: [name], MD;  Location: [place];  Service: General;  Laterality: Right;   BREAST REDUCTION SURGERY     CRANIOTOMY Right 07/27/2016   Procedure: Right Suboccipital craniotomy for tumor resection with stereotactic navigation;  Surgeon: Kevan Ny Ditty, MD;  Location: [place];  Service: Neurosurgery;  Laterality: Right;   PORT-A-CATH REMOVAL N/A 06/16/2015   Procedure: REMOVAL PORT-A-CATH;  Surgeon: [name], MD;  Location: [place];  Service: General;  Laterality: N/A;   PORTACATH PLACEMENT N/A 12/23/2014   Procedure: INSERTION PORT-A-CATH;  Surgeon: [name], MD;  Location: [place];  Service: General;  Laterality: N/A;   PORTACATH PLACEMENT Left 07/08/2017   Procedure: INSERTION PORT-A-CATH;  Surgeon: [name], MD;  Location: [place];  Service: General;  Laterality: Left;    Social History:  Social History   Socioeconomic History   Marital status: Divorced    Spouse name: Not on file   Number of children: 1   Years of education: Not on file   Highest education level: Not on file  Occupational History   Not on file  Social Needs    Financial resource strain: Not on file   Food insecurity    Worry: Not on file    Inability: Not on file   Transportation needs    Medical: No    Non-medical: No  Tobacco Use   Smoking status: Never Smoker   Smokeless tobacco: Never Used  Substance and Sexual Activity   Alcohol use: Yes    Comment: social   Drug use: No   Sexual activity: Yes  Lifestyle   Physical activity    Days per week: Not on file    Minutes per session: Not on file   Stress: Not on file  Relationships   Social connections    Talks on phone: Not on file    Gets together: Not on file    Attends religious service: Not on file    Active member of club or organization: Not on file    Attends meetings of clubs or organizations: Not on file    Relationship status: Not on file   Intimate partner violence    Fear of current or ex partner: No    Emotionally abused: No    Physically  abused: No    Forced sexual activity: No  Other Topics Concern   Not on file  Social History Narrative   Not on file  The patient is divorced. She has worked for [employer] firm, and continues working from home full time.  Family History: Family History  Problem Relation Age of Onset   Aneurysm Mother 73       d.55   Heart attack Father 13       d.62   Endometrial cancer Sister 36   Lung cancer Maternal Uncle        d.68s   Cancer Maternal Grandmother        unspecified type-possibly stomach d.88s                          ALLERGIES:  has No Known Allergies.  Meds: Current Outpatient Medications  Medication Sig Dispense Refill   alprazolam (XANAX) 2 MG tablet Take 1 tablet (2 mg total) by mouth at bedtime. 30 tablet 5   betamethasone valerate ointment (VALISONE) 0.1 % Apply 1 application topically 2 (two) times daily. 30 g 1   levothyroxine (SYNTHROID) 137 MCG tablet Take 1 tablet (137 mcg total) by mouth daily before breakfast. 30 tablet 1   lidocaine-prilocaine (EMLA) cream APPLY  TO AFFECTED AREA ONCE AS DIRECTED 30 g 3   lisinopril-hydrochlorothiazide (PRINZIDE,ZESTORETIC) 20-12.5 MG tablet Take 1 tablet by mouth 2 (two) times daily. 180 tablet 3   phentermine 15 MG capsule Take 1 capsule (15 mg total) by mouth every morning. 30 capsule 1   traMADol (ULTRAM) 50 MG tablet Take 1 tablet (50 mg total) by mouth every 12 (twelve) hours as needed for moderate pain. 30 tablet 0   triamcinolone ointment (KENALOG) 0.5 % APPLY TO AFFECTED AREA TWICE A DAY 30 g 0   cyclobenzaprine (FLEXERIL) 5 MG tablet Take 1 tablet (5 mg total) by mouth 3 (three) times daily as needed for muscle spasms. (Patient not taking: Reported on 04/17/2019) 30 tablet 0   dexamethasone (DECADRON) 0.1 % ophthalmic suspension Place 1 drop into the right eye every 8 (eight) hours as needed. (Patient not taking: Reported on 04/17/2019) 5 mL 0   No current facility-administered medications for this encounter.     Physical Findings:   vitals were not taken for this visit.    Unable to assess due to telephone visit format.   Lab Findings: Lab Results  Component Value Date   WBC 3.6 (L) 04/13/2019   WBC 3.0 (L) 03/02/2018   HGB 13.2 04/13/2019   HGB 11.7 04/04/2017   HCT 40.7 04/13/2019   HCT 35.9 04/04/2017   PLT 176 04/13/2019   PLT 207 04/04/2017    Lab Results  Component Value Date   NA 140 04/13/2019   NA 139 04/04/2017   K 3.6 04/13/2019   K 3.7 04/04/2017   CHLORIDE 108 04/04/2017   CO2 26 04/13/2019   CO2 24 04/04/2017   GLUCOSE 92 04/13/2019   GLUCOSE 76 04/04/2017   BUN 13 04/13/2019   BUN 5.8 (L) 04/04/2017   CREATININE 0.75 04/13/2019   CREATININE 0.8 04/04/2017   BILITOT 0.5 04/13/2019   BILITOT 0.43 04/04/2017   ALKPHOS 95 04/13/2019   ALKPHOS 131 04/04/2017   AST 16 04/13/2019   AST 18 04/04/2017   ALT 11 04/13/2019   ALT 12 04/04/2017   PROT 7.2 04/13/2019   PROT 7.0 04/04/2017   ALBUMIN 3.8 04/13/2019   ALBUMIN  3.0 (L) 04/04/2017   CALCIUM 9.0 04/13/2019     CALCIUM 8.6 04/04/2017   ANIONGAP 8 04/13/2019    Radiographic Findings: Mr Jeri Cos KZ Contrast  Result Date: 04/14/2019 CLINICAL DATA:  Metastatic breast cancer. Surgical and radiation treatment. EXAM: MRI HEAD WITHOUT AND WITH CONTRAST TECHNIQUE: Multiplanar, multiecho pulse sequences of the brain and surrounding structures were obtained without and with intravenous contrast. CONTRAST:  45m MULTIHANCE GADOBENATE DIMEGLUMINE 529 MG/ML IV SOLN COMPARISON:  MRI brain 01/13/2019 FINDINGS: Brain: Postsurgical changes right lateral cerebellum stable. 3.7 mm enhancing nodule within this area of encephalomalacia also stable. No new enhancing lesions identified. Ventricle size normal. Negative for acute infarct. No significant chronic ischemia. Vascular: Normal arterial flow voids Skull and upper cervical spine: Chronic fracture of C2 with anterior slip of C2 and C3 approximately 5 mm unchanged. Sinuses/Orbits: Negative Other: None IMPRESSION: Stable MRI. Postsurgical changes right lateral cerebellum with 3.7 mm enhancing nodule unchanged from prior study. No new lesions. Electronically Signed   By: CFranchot GalloM.D.   On: 04/14/2019 17:13    Impression/Plan: 1. 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  Her most recent MRI brain from 04/14/19 was recently reviewed with the multidisciplinary tumor board this morning and revealed stability of the 3 mm enhancing nodule in the right lateral cerebellum at the site of previous tumor treatment, felt to be consistent with treatment related changes, especially given the stable appearance, unlikely to be disease recurrence. There were no other enhancing lesions. The recommendation is to continue with surveillance MRI brain scan in 3 months with follow up thereafter to review results and recommendations from the multidisciplinary tumor board.  If this upcoming scan remains stable, we will move to  surveillance MRI brain scans every 6 months at that time since she will be at 3 years post-treatment. Her most recent systemic imaging from 12/06/18 indicates a positive response to systemic treatment and recent palliative radiotherapy.  She met with her medical oncologist, Dr. GLindi Adiein follow-up on 04/13/19 and the recommendation is to continue with Keytruda q 3 weeks for systemic disease management as she is tolerating this well. She will have repeat CT scans for systemic disease restaging in Jan. 2021, prior to follow up with Dr. GLindi Adie She knows to call [place] any questions or concerns in the interim and is comfortable and in agreement with this plan.  I spent 25 minutes in telephone conversation with the patient and more than 50% of that time was spent in counseling and/or coordination of care.    ANicholos Johns PA-C

## 2019-05-03 NOTE — Progress Notes (Signed)
Patient Care Team: [name], MD as PCP - General (Hematology and Oncology) [name], [name], [name], [name], [name], [name], NP (Inactive) as Nurse Practitioner (Nurse Practitioner)  DIAGNOSIS:    ICD-10-CM   1. 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%, HER-2 negative ratio 1.43   12/10/2014 Breast MRI   Right breast lower outer quadrant: 2.3 x 2.4 x 2.4 cm rim-enhancing mass abuts the pectoralis fascia but no enhancement of pectoralis muscle, second focus of artifact?'s second tissue marker clip, no lymph nodes   12/10/2014 Clinical Stage   Stage IIA: T2 N0   12/24/2014 - 04/29/2015 Neo-Adjuvant Chemotherapy   Dose dense Adriamycin and Cytoxan 4 followed by weekly Abraxane 12   05/02/2015 Breast MRI   complete radiologic response   06/16/2015 Surgery   Left Lumpectomy: Complete path Response, 0/2 LN   06/16/2015 Pathologic Stage   ypT0 ypN0   07/23/2015 - 09/05/2015 Radiation Therapy   Adjuvant RT: 50.4 Gy in 28 fractions and a boost of 10 Gy in 5 fractions to total dose of 60.4 Gy   10/24/2015 Survivorship   SCP mailed to patient in lieu of in person visit.   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 in BARD 1, BRCA2 and NF1   12/31/2016 Miscellaneous   Everolimus 10 mg daily for cycle 1 if she cannot tolerate will decrease to 7.5 mg daily   05/24/2017 - 07/04/2017 Chemotherapy   Xeloda 2000 mg 2 weeks on 1 week off stopped due to progression of disease based on CT scans done 06/27/2017   07/25/2017 - 03/13/2018 Chemotherapy   [place] days 1 and 8 every 3 weeks stopped for progression    03/20/2018 - 03/31/2018 Radiation Therapy   Radiation to lymph nodes   04/03/2018 -  Chemotherapy   Keytruda every 3 weeks      CHIEF COMPLIANT: Follow-up of metastatic breast canceronKeytruda  INTERVAL HISTORY: Kaitlyn Keith is a 53 y.o. with above-mentioned history of metastatic breast cancer who is currently receiving pembrolizumab immunotherapyevery 3 weeks. Brain MRI on 04/14/19 showed a stable 3.44m nodule in the right lateral cerebellum and no  new lesions. She presents to the clinic todayfor treatment.   Because of immunotherapy, she had developed hypothyroidism.  Her TSH levels have been fluctuating widely.  We had to adjust her dosage last time and she is doing much better today.  She had headaches which went away after the dose was increased.  REVIEW OF SYSTEMS:   Constitutional: Denies  fevers, chills or abnormal weight loss Eyes: Denies blurriness of vision Ears, nose, mouth, throat, and face: Denies mucositis or sore throat Respiratory: Denies cough, dyspnea or wheezes Cardiovascular: Denies palpitation, chest discomfort Gastrointestinal: Denies nausea, heartburn or change in bowel habits Skin: Denies abnormal skin rashes Lymphatics: Denies new lymphadenopathy or easy bruising Neurological: Denies numbness, tingling or new weaknesses Behavioral/Psych: Mood is stable, no new changes  Extremities: No lower extremity edema Breast: denies any pain or lumps or nodules in either breasts All other systems were reviewed with the patient and are negative.  I have reviewed the past medical history, past surgical history, social history and family history with the patient and they are unchanged from previous note.  ALLERGIES:  has No Known Allergies.  MEDICATIONS:  Current Outpatient Medications  Medication Sig Dispense Refill  . alprazolam (XANAX) 2 MG tablet Take 1 tablet (2 mg total) by mouth at bedtime. 30 tablet 5  . betamethasone valerate ointment (VALISONE) 0.1 % Apply 1 application topically 2 (two) times daily. 30 g 1  . cyclobenzaprine (FLEXERIL) 5 MG tablet Take 1 tablet (5 mg total) by mouth 3 (three) times daily as needed for muscle spasms. (Patient not taking: Reported on 04/17/2019) 30 tablet 0  . dexamethasone (DECADRON) 0.1 % ophthalmic suspension Place 1 drop into the right eye every 8 (eight) hours as needed. (Patient not taking: Reported on 04/17/2019) 5 mL 0  . levothyroxine (SYNTHROID) 137 MCG tablet Take 1 tablet (137 mcg total) by mouth daily before breakfast. 30 tablet 1  . lidocaine-prilocaine (EMLA) cream APPLY TO AFFECTED AREA ONCE AS DIRECTED 30 g 3  . lisinopril-hydrochlorothiazide (PRINZIDE,ZESTORETIC) 20-12.5 MG tablet Take 1 tablet by mouth 2 (two) times daily. 180 tablet 3  . phentermine 15 MG capsule Take 1 capsule (15 mg total) by mouth every  morning. 30 capsule 1  . traMADol (ULTRAM) 50 MG tablet Take 1 tablet (50 mg total) by mouth every 12 (twelve) hours as needed for moderate pain. 30 tablet 0  . triamcinolone ointment (KENALOG) 0.5 % APPLY TO AFFECTED AREA TWICE A DAY 30 g 0   No current facility-administered medications for this visit.    PHYSICAL EXAMINATION: ECOG PERFORMANCE STATUS: 1 - Symptomatic but completely ambulatory  Vitals:   05/04/19 0856  BP: (!) 145/93  Pulse: 68  Resp: 18  SpO2: 100%   Filed Weights   05/04/19 0856  Weight: 189 lb 3.2 oz (85.8 kg)    GENERAL: alert, no distress and comfortable SKIN: skin color, texture, turgor are normal, no rashes or significant lesions EYES: normal, Conjunctiva are pink and non-injected, sclera clear OROPHARYNX: no exudate, no erythema and lips, buccal mucosa, and tongue normal  NECK: supple, thyroid normal size, non-tender, without nodularity LYMPH: no palpable lymphadenopathy in the cervical, axillary or inguinal LUNGS: clear to auscultation and percussion with normal breathing effort HEART: regular rate & rhythm and no murmurs and no lower extremity edema ABDOMEN: abdomen soft, non-tender and normal bowel sounds MUSCULOSKELETAL: no cyanosis of digits and no clubbing  NEURO: alert & oriented x 3 with fluent speech, no focal motor/sensory deficits EXTREMITIES: No lower extremity  edema  LABORATORY DATA:  I have reviewed the data as listed CMP Latest Ref Rng & Units 04/13/2019 03/23/2019 03/01/2019  Glucose 70 - 99 mg/dL 92 92 115(H)  BUN 6 - 20 mg/dL _0 Creatinine 0.44 - 1.00 mg/dL 0.75 0.88 0.76  Sodium 135 - 145 mmol/L 140 139 140  Potassium 3.5 - 5.1 mmol/L 3.6 3.5 3.5  Chloride 98 - 111 mmol/L 106 103 107  CO2 22 - 32 mmol/L _1 Calcium 8.9 - 10.3 mg/dL 9.0 9.1 8.6(L)  Total Protein 6.5 - 8.1 g/dL 7.2 8.0 6.9  Total Bilirubin 0.3 - 1.2 mg/dL 0.5 0.7 0.5  Alkaline Phos 38 - 126 U/L 95 108 91  AST 15 - 41 U/L _2 ALT 0 - 44 U/L  _3 Lab Results  Component Value Date   WBC 3.9 (L) 05/04/2019   HGB 13.1 05/04/2019   HCT 40.0 05/04/2019   MCV 88.5 05/04/2019   PLT 168 05/04/2019   NEUTROABS 2.6 05/04/2019    ASSESSMENT & PLAN:  Breast cancer of lower-outer quadrant of right female breast ([place]) Right breast biopsy 12/03/2014 8:00: Invasive ductal carcinoma, grade 3, ER 0%, PR 0%, 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 Neoadj chemo 12/24/14- 04/29/15 AC x 4 foll by Abraxane X 12 Rt Lumpectomy: Path CR 0/2 LN Adj XRT 07/23/15- 09/08/15 PET/CT scan 11/17/2016:Subcutaneous nodules in the neck, upper back, left arm, abdomen and pelvis Patient progressed on Xeloda January 2019-07/04/2017 stopped due to progression of disease Cerebellar mass diagnosed 07/12/2016: Resection followed by stereotactic radiation 07/29/2016 Lymph node from 03/02/2018 biopsy: PDL1+ ------------------------------------------------------------------------------------------------------------------------------------------------ Current treatment:Pembrolizumab given every 3 weeks starting 04/03/18, today is cycle 20 Keytruda toxicities:Hypothyroidism for which she required thyroid replacement therapy Currently on 151mg of Synthroid.  TSH level:11.6 on 04/13/2019  Keytruda toxicities: 1.Severe scalp pain along the surgical scars very intense lasting for 48 hours improved with Aleve.Takes gabapentin, applies triamcinolone ointment 2.Right eye dryness: dexamethasone eyedrops. 3.Headaches: Brain MRI 04/14/2019. 4.Hypothyroidism:Synthroid1270m.   CT CAP: 12/06/2018:Complete resolution of metastatic focus in the right inguinal/groin region as well as complete resolution of the left supraclavicular lymphadenopathy. Stable 6 mm hypodense lesion in the liver. No current evidence of active  malignancy.  Brain MRI8/22/2020:Stable 3 mm nodule  Brain MRI will be done 04/14/2019   Weight gain:On phentermine: She is losing weight steadily. Our next scans will be done in January 2021.    Orders Placed This Encounter  Procedures  . CT Chest W Contrast    Standing Status:   Future    Standing Expiration Date:   05/03/2020    Order Specific Question:   If indicated for the ordered procedure, I authorize the administration of contrast media per Radiology protocol    Answer:   Yes    Order Specific Question:   Is patient pregnant?    Answer:   No    Order Specific Question:   [facility]?    [facility]  Order Specific Question:   Radiology Contrast Protocol - do NOT remove file path    Answer:   \\[email or web address].[email or web address]  . CT Abdomen Pelvis W Contrast    Standing Status:   Future    Standing Expiration Date:   05/03/2020    Order Specific Question:  If indicated for the ordered procedure, I authorize the administration of contrast media per Radiology protocol    Answer:   Yes    Order Specific Question:   Is patient pregnant?    Answer:   No    Order Specific Question:   Preferred imaging location?    Answer:   [facility]    Order Specific Question:   Is Oral Contrast requested for this exam?    Answer:   Yes, Per Radiology protocol    Order Specific Question:   Radiology Contrast Protocol - do NOT remove file path    Answer:   \\[email or web address].[email or web address]   The patient has a good understanding of the overall plan. she agrees with it. she will call with any problems that may develop before the next visit here.  [name], MD 05/04/2019  Julious Oka Dorshimer, am acting as scribe for Dr. [name].  I have reviewed the above document for accuracy and completeness, and I agree with the above.

## 2019-05-04 ENCOUNTER — Inpatient Hospital Stay: Payer: BC Managed Care – PPO

## 2019-05-04 ENCOUNTER — Other Ambulatory Visit: Payer: Self-pay

## 2019-05-04 ENCOUNTER — Inpatient Hospital Stay (HOSPITAL_BASED_OUTPATIENT_CLINIC_OR_DEPARTMENT_OTHER): Payer: BC Managed Care – PPO | Admitting: Hematology and Oncology

## 2019-05-04 ENCOUNTER — Inpatient Hospital Stay: Payer: BC Managed Care – PPO | Attending: Hematology and Oncology

## 2019-05-04 DIAGNOSIS — R635 Abnormal weight gain: Secondary | ICD-10-CM | POA: Insufficient documentation

## 2019-05-04 DIAGNOSIS — C50511 Malignant neoplasm of lower-outer quadrant of right female breast: Secondary | ICD-10-CM

## 2019-05-04 DIAGNOSIS — C778 Secondary and unspecified malignant neoplasm of lymph nodes of multiple regions: Secondary | ICD-10-CM | POA: Insufficient documentation

## 2019-05-04 DIAGNOSIS — Z923 Personal history of irradiation: Secondary | ICD-10-CM | POA: Diagnosis not present

## 2019-05-04 DIAGNOSIS — Z7952 Long term (current) use of systemic steroids: Secondary | ICD-10-CM | POA: Diagnosis not present

## 2019-05-04 DIAGNOSIS — Z171 Estrogen receptor negative status [ER-]: Secondary | ICD-10-CM

## 2019-05-04 DIAGNOSIS — C7931 Secondary malignant neoplasm of brain: Secondary | ICD-10-CM | POA: Insufficient documentation

## 2019-05-04 DIAGNOSIS — C7989 Secondary malignant neoplasm of other specified sites: Secondary | ICD-10-CM | POA: Diagnosis not present

## 2019-05-04 DIAGNOSIS — C792 Secondary malignant neoplasm of skin: Secondary | ICD-10-CM

## 2019-05-04 DIAGNOSIS — Z5112 Encounter for antineoplastic immunotherapy: Secondary | ICD-10-CM | POA: Insufficient documentation

## 2019-05-04 DIAGNOSIS — Z79899 Other long term (current) drug therapy: Secondary | ICD-10-CM | POA: Diagnosis not present

## 2019-05-04 DIAGNOSIS — E039 Hypothyroidism, unspecified: Secondary | ICD-10-CM | POA: Diagnosis not present

## 2019-05-04 DIAGNOSIS — H04121 Dry eye syndrome of right lacrimal gland: Secondary | ICD-10-CM | POA: Insufficient documentation

## 2019-05-04 LAB — CBC WITH DIFFERENTIAL (CANCER CENTER ONLY)
Abs Immature Granulocytes: 0.01 10*3/uL (ref 0.00–0.07)
Basophils Absolute: 0 10*3/uL (ref 0.0–0.1)
Basophils Relative: 1 %
Eosinophils Absolute: 0 10*3/uL (ref 0.0–0.5)
Eosinophils Relative: 1 %
HCT: 40 % (ref 36.0–46.0)
Hemoglobin: 13.1 g/dL (ref 12.0–15.0)
Immature Granulocytes: 0 %
Lymphocytes Relative: 24 %
Lymphs Abs: 0.9 10*3/uL (ref 0.7–4.0)
MCH: 29 pg (ref 26.0–34.0)
MCHC: 32.8 g/dL (ref 30.0–36.0)
MCV: 88.5 fL (ref 80.0–100.0)
Monocytes Absolute: 0.3 10*3/uL (ref 0.1–1.0)
Monocytes Relative: 7 %
Neutro Abs: 2.6 10*3/uL (ref 1.7–7.7)
Neutrophils Relative %: 67 %
Platelet Count: 168 10*3/uL (ref 150–400)
RBC: 4.52 MIL/uL (ref 3.87–5.11)
RDW: 12.1 % (ref 11.5–15.5)
WBC Count: 3.9 10*3/uL — ABNORMAL LOW (ref 4.0–10.5)
nRBC: 0 % (ref 0.0–0.2)

## 2019-05-04 LAB — CMP (CANCER CENTER ONLY)
ALT: 12 U/L (ref 0–44)
AST: 16 U/L (ref 15–41)
Albumin: 3.6 g/dL (ref 3.5–5.0)
Alkaline Phosphatase: 105 U/L (ref 38–126)
Anion gap: 9 (ref 5–15)
BUN: 15 mg/dL (ref 6–20)
CO2: 24 mmol/L (ref 22–32)
Calcium: 8.7 mg/dL — ABNORMAL LOW (ref 8.9–10.3)
Chloride: 108 mmol/L (ref 98–111)
Creatinine: 0.78 mg/dL (ref 0.44–1.00)
GFR, Est AFR Am: >60 mL/min (ref >60)
GFR, Estimated: >60 mL/min (ref >60)
Glucose, Bld: 107 mg/dL — ABNORMAL HIGH (ref 70–99)
Potassium: 3.6 mmol/L (ref 3.5–5.1)
Sodium: 141 mmol/L (ref 135–145)
Total Bilirubin: 0.4 mg/dL (ref 0.3–1.2)
Total Protein: 7 g/dL (ref 6.5–8.1)

## 2019-05-04 LAB — TSH: TSH: 3.305 u[IU]/mL (ref 0.308–3.960)

## 2019-05-04 MED ORDER — SODIUM CHLORIDE 0.9% FLUSH
10.0000 mL | INTRAVENOUS | Status: DC | PRN
  Administered 2019-05-04: 10 mL
  Filled 2019-05-04: qty 10

## 2019-05-04 MED ORDER — SODIUM CHLORIDE 0.9 % IV SOLN
200.0000 mg | Freq: Once | INTRAVENOUS | Status: AC
  Administered 2019-05-04: 200 mg via INTRAVENOUS
  Filled 2019-05-04: qty 8

## 2019-05-04 MED ORDER — SODIUM CHLORIDE 0.9 % IV SOLN
Freq: Once | INTRAVENOUS | Status: AC
  Administered 2019-05-04: 10:00:00 via INTRAVENOUS
  Filled 2019-05-04: qty 250

## 2019-05-04 MED ORDER — SODIUM CHLORIDE 0.9% FLUSH
10.0000 mL | INTRAVENOUS | Status: DC | PRN
  Administered 2019-05-04: 09:00:00 10 mL
  Filled 2019-05-04: qty 10

## 2019-05-04 MED ORDER — HEPARIN SOD (PORK) LOCK FLUSH 100 UNIT/ML IV SOLN
500.0000 [IU] | Freq: Once | INTRAVENOUS | Status: AC | PRN
  Administered 2019-05-04: 500 [IU]
  Filled 2019-05-04: qty 5

## 2019-05-04 NOTE — Patient Instructions (Signed)
[facility] Discharge Instructions for Patients Receiving Chemotherapy  Today you received the following chemotherapy agents Pembrolizumab (KEYTRUDA).  To help prevent nausea and vomiting after your treatment, we encourage you to take your nausea medication as prescribed.   If you develop nausea and vomiting that is not controlled by your nausea medication, call the clinic.   BELOW ARE SYMPTOMS THAT SHOULD BE REPORTED IMMEDIATELY:  *FEVER GREATER THAN 100.5 F  *CHILLS WITH OR WITHOUT FEVER  NAUSEA AND VOMITING THAT IS NOT CONTROLLED WITH YOUR NAUSEA MEDICATION  *UNUSUAL SHORTNESS OF BREATH  *UNUSUAL BRUISING OR BLEEDING  TENDERNESS IN MOUTH AND THROAT WITH OR WITHOUT PRESENCE OF ULCERS  *URINARY PROBLEMS  *BOWEL PROBLEMS  UNUSUAL RASH Items with * indicate a potential emergency and should be followed up as soon as possible.  Feel free to call the clinic should you have any questions or concerns. The clinic phone number is [phone].  Please show the CHEMO ALERT CARD at check-in to the Emergency Department and triage nurse.  Coronavirus (COVID-19) Are you at risk?  Are you at risk for the Coronavirus (COVID-19)?  To be considered HIGH RISK for Coronavirus (COVID-19), you have to meet the following criteria:  . Traveled to [place], [place], [place], [place] or [place]; or in the [place] to [place], [place], [place], or [place]; and have fever, cough, and shortness of breath within the last 2 weeks of travel OR . Been in close contact with a person diagnosed with COVID-19 within the last 2 weeks and have fever, cough, and shortness of breath . IF YOU DO NOT MEET THESE CRITERIA, YOU ARE CONSIDERED LOW RISK FOR COVID-19.  What to do if you are HIGH RISK for COVID-19?  . If you are having a medical emergency, call 911. . Seek medical care right away. Before you go to a doctor's office, urgent care or emergency department, call ahead and tell  them about your recent travel, contact with someone diagnosed with COVID-19, and your symptoms. You should receive instructions from your physician's office regarding next steps of care.  . When you arrive at healthcare provider, tell the healthcare staff immediately you have returned from visiting [place], [place], [place], [place] or [place]; or traveled in the [place] to [place], [place], [place], or [place]; in the last two weeks or you have been in close contact with a person diagnosed with COVID-19 in the last 2 weeks.   . Tell the health care staff about your symptoms: fever, cough and shortness of breath. . After you have been seen by a medical provider, you will be either: o Tested for (COVID-19) and discharged home on quarantine except to seek medical care if symptoms worsen, and asked to  - Stay home and avoid contact with others until you get your results (4-5 days)  - Avoid travel on public transportation if possible (such as bus, train, or airplane) or o Sent to the Emergency Department by EMS for evaluation, COVID-19 testing, and possible admission depending on your condition and test results.  What to do if you are LOW RISK for COVID-19?  Reduce your risk of any infection by using the same precautions used for avoiding the common cold or flu:  . Wash your hands often with soap and warm water for at least 20 seconds.  If soap and water are not readily available, use an alcohol-based hand sanitizer with at least 60% alcohol.  . If coughing or   sneezing, cover your mouth and nose by coughing or sneezing into the elbow areas of your shirt or coat, into a tissue or into your sleeve (not your hands). . Avoid shaking hands with others and consider head nods or verbal greetings only. . Avoid touching your eyes, nose, or mouth with unwashed hands.  . Avoid close contact with people who are sick. . Avoid places or events with large numbers of people in one location, like concerts or  sporting events. . Carefully consider travel plans you have or are making. . If you are planning any travel outside or inside the [place], visit the CDC's Travelers' Health webpage for the latest health notices. . If you have some symptoms but not all symptoms, continue to monitor at home and seek medical attention if your symptoms worsen. . If you are having a medical emergency, call 911.   ADDITIONAL HEALTHCARE OPTIONS FOR PATIENTS   Telehealth / e-Visit: [email or web address]         [facility]: [phone]  [facility]: [phone]                   [facility]: [phone]    

## 2019-05-04 NOTE — Assessment & Plan Note (Signed)
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 Neoadj chemo 12/24/14- 04/29/15 AC x 4 foll by Abraxane X 12 Rt Lumpectomy: Path CR 0/2 LN Adj XRT 07/23/15- 09/08/15 PET/CT scan 11/17/2016:Subcutaneous nodules in the neck, upper back, left arm, abdomen and pelvis Patient progressed on Xeloda January 2019-07/04/2017 stopped due to progression of disease Cerebellar mass diagnosed 07/12/2016: Resection followed by stereotactic radiation 07/29/2016 Lymph node from 03/02/2018 biopsy: PDL1+ ------------------------------------------------------------------------------------------------------------------------------------------------ Current treatment:Pembrolizumab given every 3 weeks starting 04/03/18, today is cycle 20 Keytruda toxicities:Hypothyroidism for which she required thyroid replacement therapy Currently on 126mg of Synthroid.  TSH level:11.6 on 04/13/2019  Keytruda toxicities: 1.Severe scalp pain along the surgical scars very intense lasting for 48 hours improved with Aleve.Takes gabapentin, applies triamcinolone ointment 2.Right eye dryness: dexamethasone eyedrops. 3.Headaches: Brain MRI 04/14/2019. 4.Hypothyroidism:Synthroid1283m.   CT CAP: 12/06/2018:Complete resolution of metastatic focus in the right inguinal/groin region as well as complete resolution of the left supraclavicular lymphadenopathy. Stable 6 mm hypodense lesion in the liver. No current evidence of active malignancy.  Brain MRI8/22/2020:Stable 3 mm nodule  Brain MRI will be done 04/14/2019   Weight gain:On phentermine: She is losing weight steadily. Our next scans will be done in January 2021.

## 2019-05-06 ENCOUNTER — Other Ambulatory Visit: Payer: Self-pay | Admitting: Hematology and Oncology

## 2019-05-23 NOTE — Progress Notes (Signed)
Patient Care Team: [name], MD as PCP - General (Hematology and Oncology) [name], [name], [name], [name], [name], [name], NP (Inactive) as Nurse Practitioner (Nurse Practitioner)  DIAGNOSIS:    ICD-10-CM   1. 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%, HER-2 negative ratio 1.43   12/10/2014 Breast MRI   Right breast lower outer quadrant: 2.3 x 2.4 x 2.4 cm rim-enhancing mass abuts the pectoralis fascia but no enhancement of pectoralis muscle, second focus of artifact?'s second tissue marker clip, no lymph nodes   12/10/2014 Clinical Stage   Stage IIA: T2 N0   12/24/2014 - 04/29/2015 Neo-Adjuvant Chemotherapy   Dose dense Adriamycin and Cytoxan 4 followed by weekly Abraxane 12   05/02/2015 Breast MRI   complete radiologic response   06/16/2015 Surgery   Left Lumpectomy: Complete path Response, 0/2 LN   06/16/2015 Pathologic Stage   ypT0 ypN0   07/23/2015 - 09/05/2015 Radiation Therapy   Adjuvant RT: 50.4 Gy in 28 fractions and a boost of 10 Gy in 5 fractions to total dose of 60.4 Gy   10/24/2015 Survivorship   SCP mailed to patient in lieu of in person visit.   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 in BARD 1, BRCA2 and NF1   12/31/2016 Miscellaneous   Everolimus 10 mg daily for cycle 1 if she cannot tolerate will decrease to 7.5 mg daily   05/24/2017 - 07/04/2017 Chemotherapy   Xeloda 2000 mg 2 weeks on 1 week off stopped due to progression of disease based on CT scans done 06/27/2017   07/25/2017 - 03/13/2018 Chemotherapy   [place] days 1 and 8 every 3 weeks stopped for progression    03/20/2018 - 03/31/2018 Radiation Therapy   Radiation to lymph nodes   04/03/2018 -  Chemotherapy   Keytruda every 3 weeks      CHIEF COMPLIANT: Follow-up of metastatic breast canceronKeytruda  INTERVAL HISTORY: Kaitlyn Keith is a 53 y.o. with above-mentioned history of metastatic breast cancer who is currently receiving pembrolizumab immunotherapyevery 3 weeks. She presents to the clinic todayfor treatment.  REVIEW OF SYSTEMS:   Constitutional: Denies fevers, chills or abnormal weight loss Eyes: Denies  blurriness of vision Ears, nose, mouth, throat, and face: Denies mucositis or sore throat Respiratory: Denies cough, dyspnea or wheezes Cardiovascular: Denies palpitation, chest discomfort Gastrointestinal: Denies nausea, heartburn or change in bowel habits Skin: Denies abnormal skin rashes Lymphatics: Denies new lymphadenopathy or easy  bruising Neurological: Denies numbness, tingling or new weaknesses Behavioral/Psych: Mood is stable, no new changes  Extremities: No lower extremity edema Breast: denies any pain or lumps or nodules in either breasts All other systems were reviewed with the patient and are negative.  I have reviewed the past medical history, past surgical history, social history and family history with the patient and they are unchanged from previous note.  ALLERGIES:  has No Known Allergies.  MEDICATIONS:  Current Outpatient Medications  Medication Sig Dispense Refill  . alprazolam (XANAX) 2 MG tablet Take 1 tablet (2 mg total) by mouth at bedtime. 30 tablet 5  . betamethasone valerate ointment (VALISONE) 0.1 % Apply 1 application topically 2 (two) times daily. 30 g 1  . cyclobenzaprine (FLEXERIL) 5 MG tablet Take 1 tablet (5 mg total) by mouth 3 (three) times daily as needed for muscle spasms. (Patient not taking: Reported on 04/17/2019) 30 tablet 0  . dexamethasone (DECADRON) 0.1 % ophthalmic suspension Place 1 drop into the right eye every 8 (eight) hours as needed. (Patient not taking: Reported on 04/17/2019) 5 mL 0  . levothyroxine (SYNTHROID) 137 MCG tablet TAKE 1 TABLET (137 MCG TOTAL) BY MOUTH DAILY BEFORE BREAKFAST. 30 tablet 1  . lidocaine-prilocaine (EMLA) cream APPLY TO AFFECTED AREA ONCE AS DIRECTED 30 g 3  . lisinopril-hydrochlorothiazide (PRINZIDE,ZESTORETIC) 20-12.5 MG tablet Take 1 tablet by mouth 2 (two) times daily. 180 tablet 3  . phentermine 15 MG capsule Take 1 capsule (15 mg total) by mouth every morning. 30 capsule 1  . traMADol (ULTRAM) 50 MG tablet Take 1 tablet (50 mg total) by mouth every 12 (twelve) hours as needed for moderate pain. 30 tablet 0  . triamcinolone ointment (KENALOG) 0.5 % APPLY TO AFFECTED AREA TWICE A DAY 30 g 0   No current facility-administered medications for this visit.   Facility-Administered Medications Ordered in Other Visits  Medication Dose Route  Frequency Provider Last Rate Last Admin  . sodium chloride flush (NS) 0.9 % injection 10 mL  10 mL Intracatheter PRN [name], MD   10 mL at 05/24/19 1118    PHYSICAL EXAMINATION: ECOG PERFORMANCE STATUS: 1 - Symptomatic but completely ambulatory  There were no vitals filed for this visit. There were no vitals filed for this visit.  GENERAL: alert, no distress and comfortable SKIN: skin color, texture, turgor are normal, no rashes or significant lesions EYES: normal, Conjunctiva are pink and non-injected, sclera clear OROPHARYNX: no exudate, no erythema and lips, buccal mucosa, and tongue normal  NECK: supple, thyroid normal size, non-tender, without nodularity LYMPH: no palpable lymphadenopathy in the cervical, axillary or inguinal LUNGS: clear to auscultation and percussion with normal breathing effort HEART: regular rate & rhythm and no murmurs and no lower extremity edema ABDOMEN: abdomen soft, non-tender and normal bowel sounds MUSCULOSKELETAL: no cyanosis of digits and no clubbing  NEURO: alert & oriented x 3 with fluent speech, no focal motor/sensory deficits EXTREMITIES: No lower extremity edema  LABORATORY DATA:  I have reviewed the data as listed CMP Latest Ref Rng & Units 05/04/2019 04/13/2019 03/23/2019  Glucose 70 - 99 mg/dL 107(H) 92 92  BUN 6 - 20 mg/dL '15 13 16  ' Creatinine 0.44 - 1.00 mg/dL 0.78  0.75 0.88  Sodium 135 - 145 mmol/L 141 140 139  Potassium 3.5 - 5.1 mmol/L 3.6 3.6 3.5  Chloride 98 - 111 mmol/L 108 106 103  CO2 22 - 32 mmol/L '24 26 26  ' Calcium 8.9 - 10.3 mg/dL 8.7(L) 9.0 9.1  Total Protein 6.5 - 8.1 g/dL 7.0 7.2 8.0  Total Bilirubin 0.3 - 1.2 mg/dL 0.4 0.5 0.7  Alkaline Phos 38 - 126 U/L 105 95 108  AST 15 - 41 U/L '16 16 19  ' ALT 0 - 44 U/L '12 11 11    ' Lab Results  Component Value Date   WBC 3.9 (L) 05/04/2019   HGB 13.1 05/04/2019   HCT 40.0 05/04/2019   MCV 88.5 05/04/2019   PLT 168 05/04/2019   NEUTROABS 2.6 05/04/2019     ASSESSMENT & PLAN:  Breast cancer of lower-outer quadrant of right female breast ([place]) Right breast biopsy 12/03/2014 8:00: Invasive ductal carcinoma, grade 3, ER 0%, PR 0%, 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 Neoadj chemo 12/24/14- 04/29/15 AC x 4 foll by Abraxane X 12 Rt Lumpectomy: Path CR 0/2 LN Adj XRT 07/23/15- 09/08/15 PET/CT scan 11/17/2016:Subcutaneous nodules in the neck, upper back, left arm, abdomen and pelvis Patient progressed on Xeloda January 2019-07/04/2017 stopped due to progression of disease Cerebellar mass diagnosed 07/12/2016: Resection followed by stereotactic radiation 07/29/2016 Lymph node from 03/02/2018 biopsy: PDL1+ ------------------------------------------------------------------------------------------------------------------------------------------------ Current treatment:Pembrolizumab given every 3 weeks starting 04/03/18, today is cycle 21 Keytruda toxicities:Hypothyroidism for which she required thyroid replacement therapy Currently on 126mg of Synthroid.  TSH level:11.6 on 04/13/2019  Keytruda toxicities: 1.Severe scalp pain along the surgical scars very intense lasting for 48 hours improved with Aleve.Takes gabapentin, applies triamcinolone ointment 2.Right eye dryness: dexamethasone eyedrops. 3.Headaches:Brain MRI 04/14/2019. 4.Hypothyroidism:Synthroid129m.   CT CAP: 12/06/2018:Complete resolution of metastatic focus in the right inguinal/groin region as well as complete resolution of the left supraclavicular lymphadenopathy. Stable 6 mm hypodense lesion in the liver. No current evidence of active malignancy.  Brain MRI8/22/2020:Stable 3 mm nodule  Brain MRI will be done 04/14/2019  Weight gain:On phentermine: She is losing weight steadily. Our nextscans will be done in January 2021.    No  orders of the defined types were placed in this encounter.  The patient has a good understanding of the overall plan. she agrees with it. she will call with any problems that may develop before the next visit here.  GuNicholas LoseMD 05/24/2019  I,Julious Okaorshimer, am acting as scribe for Dr. ViNicholas Lose I have reviewed the above document for accuracy and completeness, and I agree with the above.

## 2019-05-24 ENCOUNTER — Inpatient Hospital Stay: Payer: BC Managed Care – PPO

## 2019-05-24 ENCOUNTER — Inpatient Hospital Stay (HOSPITAL_BASED_OUTPATIENT_CLINIC_OR_DEPARTMENT_OTHER): Payer: BC Managed Care – PPO | Admitting: Hematology and Oncology

## 2019-05-24 ENCOUNTER — Other Ambulatory Visit: Payer: Self-pay

## 2019-05-24 ENCOUNTER — Other Ambulatory Visit: Payer: Self-pay | Admitting: Hematology and Oncology

## 2019-05-24 DIAGNOSIS — C50511 Malignant neoplasm of lower-outer quadrant of right female breast: Secondary | ICD-10-CM | POA: Diagnosis not present

## 2019-05-24 DIAGNOSIS — Z171 Estrogen receptor negative status [ER-]: Secondary | ICD-10-CM | POA: Diagnosis not present

## 2019-05-24 DIAGNOSIS — C792 Secondary malignant neoplasm of skin: Secondary | ICD-10-CM

## 2019-05-24 LAB — CBC WITH DIFFERENTIAL (CANCER CENTER ONLY)
Abs Immature Granulocytes: 0 10*3/uL (ref 0.00–0.07)
Basophils Absolute: 0 10*3/uL (ref 0.0–0.1)
Basophils Relative: 1 %
Eosinophils Absolute: 0 10*3/uL (ref 0.0–0.5)
Eosinophils Relative: 1 %
HCT: 40.3 % (ref 36.0–46.0)
Hemoglobin: 13.1 g/dL (ref 12.0–15.0)
Immature Granulocytes: 0 %
Lymphocytes Relative: 29 %
Lymphs Abs: 1 10*3/uL (ref 0.7–4.0)
MCH: 28.4 pg (ref 26.0–34.0)
MCHC: 32.5 g/dL (ref 30.0–36.0)
MCV: 87.4 fL (ref 80.0–100.0)
Monocytes Absolute: 0.3 10*3/uL (ref 0.1–1.0)
Monocytes Relative: 7 %
Neutro Abs: 2.3 10*3/uL (ref 1.7–7.7)
Neutrophils Relative %: 62 %
Platelet Count: 170 10*3/uL (ref 150–400)
RBC: 4.61 MIL/uL (ref 3.87–5.11)
RDW: 12.4 % (ref 11.5–15.5)
WBC Count: 3.6 10*3/uL — ABNORMAL LOW (ref 4.0–10.5)
nRBC: 0 % (ref 0.0–0.2)

## 2019-05-24 LAB — CMP (CANCER CENTER ONLY)
ALT: 10 U/L (ref 0–44)
AST: 15 U/L (ref 15–41)
Albumin: 3.8 g/dL (ref 3.5–5.0)
Alkaline Phosphatase: 104 U/L (ref 38–126)
Anion gap: 9 (ref 5–15)
BUN: 13 mg/dL (ref 6–20)
CO2: 27 mmol/L (ref 22–32)
Calcium: 8.5 mg/dL — ABNORMAL LOW (ref 8.9–10.3)
Chloride: 105 mmol/L (ref 98–111)
Creatinine: 0.78 mg/dL (ref 0.44–1.00)
GFR, Est AFR Am: >60 mL/min (ref >60)
GFR, Estimated: >60 mL/min (ref >60)
Glucose, Bld: 116 mg/dL — ABNORMAL HIGH (ref 70–99)
Potassium: 3.3 mmol/L — ABNORMAL LOW (ref 3.5–5.1)
Sodium: 141 mmol/L (ref 135–145)
Total Bilirubin: 0.4 mg/dL (ref 0.3–1.2)
Total Protein: 7.3 g/dL (ref 6.5–8.1)

## 2019-05-24 LAB — TSH: TSH: 0.307 u[IU]/mL — ABNORMAL LOW (ref 0.308–3.960)

## 2019-05-24 MED ORDER — SODIUM CHLORIDE 0.9 % IV SOLN
200.0000 mg | Freq: Once | INTRAVENOUS | Status: AC
  Administered 2019-05-24: 200 mg via INTRAVENOUS
  Filled 2019-05-24: qty 8

## 2019-05-24 MED ORDER — HEPARIN SOD (PORK) LOCK FLUSH 100 UNIT/ML IV SOLN
500.0000 [IU] | Freq: Once | INTRAVENOUS | Status: AC | PRN
  Administered 2019-05-24: 14:00:00 500 [IU]
  Filled 2019-05-24: qty 5

## 2019-05-24 MED ORDER — SODIUM CHLORIDE 0.9% FLUSH
10.0000 mL | INTRAVENOUS | Status: DC | PRN
  Administered 2019-05-24: 10 mL
  Filled 2019-05-24: qty 10

## 2019-05-24 MED ORDER — SODIUM CHLORIDE 0.9 % IV SOLN
Freq: Once | INTRAVENOUS | Status: AC
  Filled 2019-05-24: qty 250

## 2019-05-24 NOTE — Patient Instructions (Signed)
[facility] Discharge Instructions for Patients Receiving Chemotherapy  Today you received the following chemotherapy agents Pembrolizumab (KEYTRUDA).  To help prevent nausea and vomiting after your treatment, we encourage you to take your nausea medication as prescribed.   If you develop nausea and vomiting that is not controlled by your nausea medication, call the clinic.   BELOW ARE SYMPTOMS THAT SHOULD BE REPORTED IMMEDIATELY:  *FEVER GREATER THAN 100.5 F  *CHILLS WITH OR WITHOUT FEVER  NAUSEA AND VOMITING THAT IS NOT CONTROLLED WITH YOUR NAUSEA MEDICATION  *UNUSUAL SHORTNESS OF BREATH  *UNUSUAL BRUISING OR BLEEDING  TENDERNESS IN MOUTH AND THROAT WITH OR WITHOUT PRESENCE OF ULCERS  *URINARY PROBLEMS  *BOWEL PROBLEMS  UNUSUAL RASH Items with * indicate a potential emergency and should be followed up as soon as possible.  Feel free to call the clinic should you have any questions or concerns. The clinic phone number is [phone].  Please show the CHEMO ALERT CARD at check-in to the Emergency Department and triage nurse.  Coronavirus (COVID-19) Are you at risk?  Are you at risk for the Coronavirus (COVID-19)?  To be considered HIGH RISK for Coronavirus (COVID-19), you have to meet the following criteria:  . Traveled to [place], [place], [place], [place] or [place]; or in the [place] to [place], [place], [place], or [place]; and have fever, cough, and shortness of breath within the last 2 weeks of travel OR . Been in close contact with a person diagnosed with COVID-19 within the last 2 weeks and have fever, cough, and shortness of breath . IF YOU DO NOT MEET THESE CRITERIA, YOU ARE CONSIDERED LOW RISK FOR COVID-19.  What to do if you are HIGH RISK for COVID-19?  . If you are having a medical emergency, call 911. . Seek medical care right away. Before you go to a doctor's office, urgent care or emergency department, call ahead and tell  them about your recent travel, contact with someone diagnosed with COVID-19, and your symptoms. You should receive instructions from your physician's office regarding next steps of care.  . When you arrive at healthcare provider, tell the healthcare staff immediately you have returned from visiting [place], [place], [place], [place] or [place]; or traveled in the [place] to [place], [place], [place], or [place]; in the last two weeks or you have been in close contact with a person diagnosed with COVID-19 in the last 2 weeks.   . Tell the health care staff about your symptoms: fever, cough and shortness of breath. . After you have been seen by a medical provider, you will be either: o Tested for (COVID-19) and discharged home on quarantine except to seek medical care if symptoms worsen, and asked to  - Stay home and avoid contact with others until you get your results (4-5 days)  - Avoid travel on public transportation if possible (such as bus, train, or airplane) or o Sent to the Emergency Department by EMS for evaluation, COVID-19 testing, and possible admission depending on your condition and test results.  What to do if you are LOW RISK for COVID-19?  Reduce your risk of any infection by using the same precautions used for avoiding the common cold or flu:  . Wash your hands often with soap and warm water for at least 20 seconds.  If soap and water are not readily available, use an alcohol-based hand sanitizer with at least 60% alcohol.  . If coughing or   sneezing, cover your mouth and nose by coughing or sneezing into the elbow areas of your shirt or coat, into a tissue or into your sleeve (not your hands). . Avoid shaking hands with others and consider head nods or verbal greetings only. . Avoid touching your eyes, nose, or mouth with unwashed hands.  . Avoid close contact with people who are sick. . Avoid places or events with large numbers of people in one location, like concerts or  sporting events. . Carefully consider travel plans you have or are making. . If you are planning any travel outside or inside the [place], visit the CDC's Travelers' Health webpage for the latest health notices. . If you have some symptoms but not all symptoms, continue to monitor at home and seek medical attention if your symptoms worsen. . If you are having a medical emergency, call 911.   ADDITIONAL HEALTHCARE OPTIONS FOR PATIENTS  [facility] / e-Visit: [email or web address]         [facility]: [phone]  [facility]: [phone]                   [facility]: [phone]    

## 2019-05-24 NOTE — Patient Instructions (Signed)

## 2019-05-24 NOTE — Assessment & Plan Note (Signed)
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 Neoadj chemo 12/24/14- 04/29/15 AC x 4 foll by Abraxane X 12 Rt Lumpectomy: Path CR 0/2 LN Adj XRT 07/23/15- 09/08/15 PET/CT scan 11/17/2016:Subcutaneous nodules in the neck, upper back, left arm, abdomen and pelvis Patient progressed on Xeloda January 2019-07/04/2017 stopped due to progression of disease Cerebellar mass diagnosed 07/12/2016: Resection followed by stereotactic radiation 07/29/2016 Lymph node from 03/02/2018 biopsy: PDL1+ ------------------------------------------------------------------------------------------------------------------------------------------------ Current treatment:Pembrolizumab given every 3 weeks starting 04/03/18, today is cycle 21 Keytruda toxicities:Hypothyroidism for which she required thyroid replacement therapy Currently on 111mg of Synthroid.  TSH level:11.6 on 04/13/2019  Keytruda toxicities: 1.Severe scalp pain along the surgical scars very intense lasting for 48 hours improved with Aleve.Takes gabapentin, applies triamcinolone ointment 2.Right eye dryness: dexamethasone eyedrops. 3.Headaches:Brain MRI 04/14/2019. 4.Hypothyroidism:Synthroid1238m.   CT CAP: 12/06/2018:Complete resolution of metastatic focus in the right inguinal/groin region as well as complete resolution of the left supraclavicular lymphadenopathy. Stable 6 mm hypodense lesion in the liver. No current evidence of active malignancy.  Brain MRI8/22/2020:Stable 3 mm nodule  Brain MRI will be done 04/14/2019  Weight gain:On phentermine: She is losing weight steadily. Our nextscans will be done in January 2021.

## 2019-06-06 ENCOUNTER — Other Ambulatory Visit: Payer: Self-pay | Admitting: Hematology and Oncology

## 2019-06-08 ENCOUNTER — Encounter (HOSPITAL_COMMUNITY): Payer: Self-pay

## 2019-06-08 ENCOUNTER — Ambulatory Visit (HOSPITAL_COMMUNITY)
Admission: RE | Admit: 2019-06-08 | Discharge: 2019-06-08 | Disposition: A | Discharge status: Home / Self Care | Payer: No Typology Code available for payment source | Source: Ambulatory Visit | Attending: Hematology and Oncology | Admitting: Hematology and Oncology

## 2019-06-08 ENCOUNTER — Other Ambulatory Visit: Payer: Self-pay

## 2019-06-08 ENCOUNTER — Telehealth: Payer: Self-pay | Admitting: Hematology and Oncology

## 2019-06-08 DIAGNOSIS — Z171 Estrogen receptor negative status [ER-]: Secondary | ICD-10-CM | POA: Diagnosis present

## 2019-06-08 DIAGNOSIS — C50511 Malignant neoplasm of lower-outer quadrant of right female breast: Secondary | ICD-10-CM | POA: Diagnosis present

## 2019-06-08 MED ORDER — IOHEXOL 300 MG/ML  SOLN
100.0000 mL | Freq: Once | INTRAMUSCULAR | Status: AC | PRN
  Administered 2019-06-08: 100 mL via INTRAVENOUS

## 2019-06-08 MED ORDER — SODIUM CHLORIDE (PF) 0.9 % IJ SOLN
INTRAMUSCULAR | Status: AC
  Filled 2019-06-08: qty 50

## 2019-06-08 NOTE — Telephone Encounter (Signed)
I informed Kaitlyn Keith that her CT chest abdomen pelvis did not show any evidence of active metastatic disease.  Possible mild ileus.

## 2019-06-11 ENCOUNTER — Ambulatory Visit (HOSPITAL_COMMUNITY): Payer: BC Managed Care – PPO

## 2019-06-14 NOTE — Progress Notes (Signed)
Patient Care Team: [name], MD as PCP - General (Hematology and Oncology) [name], [name], [name], [name], [name], [name], NP (Inactive) as Nurse Practitioner (Nurse Practitioner)  DIAGNOSIS:    ICD-10-CM   1. 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%, HER-2 negative ratio 1.43   12/10/2014 Breast MRI   Right breast lower outer quadrant: 2.3 x 2.4 x 2.4 cm rim-enhancing mass abuts the pectoralis fascia but no enhancement of pectoralis muscle, second focus of artifact?'s second tissue marker clip, no lymph nodes   12/10/2014 Clinical Stage   Stage IIA: T2 N0   12/24/2014 - 04/29/2015 Neo-Adjuvant Chemotherapy   Dose dense Adriamycin and Cytoxan 4 followed by weekly Abraxane 12   05/02/2015 Breast MRI   complete radiologic response   06/16/2015 Surgery   Left Lumpectomy: Complete path Response, 0/2 LN   06/16/2015 Pathologic Stage   ypT0 ypN0   07/23/2015 - 09/05/2015 Radiation Therapy   Adjuvant RT: 50.4 Gy in 28 fractions and a boost of 10 Gy in 5 fractions to total dose of 60.4 Gy   10/24/2015 Survivorship   SCP mailed to patient in lieu of in person visit.   07/26/2016 - 07/27/2016 Radiation Therapy    SRS brain   07/27/2016 -  07/29/2016 Hospital Admission   Cerebellar mass: Right suboccipital craniotomy for tumor resection with stereotactic navigation: Metastatic poorly differentiated adenocarcinoma with extensive necrosis positive for CK 7, MOC 31, CK 5/6; Neg for Er/PR, GATA-3, GCDFP CDX2, Napsin A and TTF-1   11/17/2016 PET scan   Subcutaneous nodules in the neck, upper back, left arm, abdomen and pelvis,. 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 in BARD 1, BRCA2 and NF1   12/31/2016 Miscellaneous   Everolimus 10 mg daily for cycle 1 if she cannot tolerate will decrease to 7.5 mg daily   05/24/2017 - 07/04/2017 Chemotherapy   Xeloda 2000 mg 2 weeks on 1 week off stopped due to progression of disease based on CT scans done 06/27/2017   07/25/2017 - 03/13/2018 Chemotherapy   [place] days 1 and 8 every 3 weeks stopped for progression    03/20/2018 - 03/31/2018 Radiation Therapy   Radiation to lymph nodes   04/03/2018 -  Chemotherapy   Keytruda every 3 weeks      CHIEF COMPLIANT: Follow-up of metastatic breast canceronKeytruda  INTERVAL HISTORY: Kaitlyn Keith is a 54 y.o. with above-mentioned history of metastatic breast cancer who is currently receiving pembrolizumab immunotherapyevery 3 weeks.CT CAP on  06/08/19 showed no evidence of recurrent or metastatic disease. She presents to the clinic todayfor treatment. She is experiencing back pain issues for which she takes Flexeril.  It is helping her. Intermittent abdominal discomfort but no diarrhea.  ALLERGIES:  has No Known Allergies.  MEDICATIONS:  Current Outpatient Medications  Medication Sig Dispense Refill  . alprazolam  (XANAX) 2 MG tablet Take 1 tablet (2 mg total) by mouth at bedtime. 30 tablet 5  . betamethasone valerate ointment (VALISONE) 0.1 % Apply 1 application topically 2 (two) times daily. 30 g 1  . cyclobenzaprine (FLEXERIL) 5 MG tablet Take 1 tablet (5 mg total) by mouth 3 (three) times daily as needed for muscle spasms. (Patient not taking: Reported on 04/17/2019) 30 tablet 0  . dexamethasone (DECADRON) 0.1 % ophthalmic suspension Place 1 drop into the right eye every 8 (eight) hours as needed. (Patient not taking: Reported on 04/17/2019) 5 mL 0  . levothyroxine (SYNTHROID) 137 MCG tablet TAKE 1 TABLET (137 MCG TOTAL) BY MOUTH DAILY BEFORE BREAKFAST. 30 tablet 1  . lidocaine-prilocaine (EMLA) cream APPLY TO AFFECTED AREA ONCE AS DIRECTED 30 g 3  . lisinopril-hydrochlorothiazide (PRINZIDE,ZESTORETIC) 20-12.5 MG tablet Take 1 tablet by mouth 2 (two) times daily. 180 tablet 3  . phentermine 15 MG capsule Take 1 capsule (15 mg total) by mouth every morning. 30 capsule 1  . traMADol (ULTRAM) 50 MG tablet Take 1 tablet (50 mg total) by mouth every 12 (twelve) hours as needed for moderate pain. 30 tablet 0  . triamcinolone ointment (KENALOG) 0.5 % APPLY TO AFFECTED AREA TWICE A DAY 30 g 0   No current facility-administered medications for this visit.    PHYSICAL EXAMINATION: ECOG PERFORMANCE STATUS: 1 - Symptomatic but completely ambulatory  Vitals:   06/15/19 0905  BP: (!) 137/95  Pulse: 70  Resp: 19  Temp: 98.7 F (37.1 C)  SpO2: 100%   Filed Weights   06/15/19 0905  Weight: 184 lb 4.8 oz (83.6 kg)    LABORATORY DATA:  I have reviewed the data as listed CMP Latest Ref Rng & Units 05/24/2019 05/04/2019 04/13/2019  Glucose 70 - 99 mg/dL 116(H) 107(H) 92  BUN 6 - 20 mg/dL '13 15 13  ' Creatinine 0.44 - 1.00 mg/dL 0.78 0.78 0.75  Sodium 135 - 145 mmol/L 141 141 140  Potassium 3.5 - 5.1 mmol/L 3.3(L) 3.6 3.6  Chloride 98 - 111 mmol/L 105 108 106  CO2 22 - 32 mmol/L '27 24 26  ' Calcium 8.9 -  10.3 mg/dL 8.5(L) 8.7(L) 9.0  Total Protein 6.5 - 8.1 g/dL 7.3 7.0 7.2  Total Bilirubin 0.3 - 1.2 mg/dL 0.4 0.4 0.5  Alkaline Phos 38 - 126 U/L 104 105 95  AST 15 - 41 U/L '15 16 16  ' ALT 0 - 44 U/L '10 12 11    ' Lab Results  Component Value Date   WBC 3.3 (L) 06/15/2019   HGB 13.2 06/15/2019   HCT 39.9 06/15/2019   MCV 86.4 06/15/2019   PLT 170 06/15/2019   NEUTROABS 2.1 06/15/2019    ASSESSMENT & PLAN:  Breast cancer of lower-outer quadrant of right female breast ([place]) Right breast biopsy 12/03/2014 8:00: Invasive ductal carcinoma, grade 3, ER 0%, PR 0%, 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 Neoadj chemo 12/24/14- 04/29/15 AC x 4 foll by Abraxane X 12 Rt  Lumpectomy: Path CR 0/2 LN Adj XRT 07/23/15- 09/08/15 PET/CT scan 11/17/2016:Subcutaneous nodules in the neck, upper back, left arm, abdomen and pelvis Patient progressed on Xeloda January 2019-07/04/2017 stopped due to progression of disease Cerebellar mass diagnosed 07/12/2016: Resection followed by stereotactic radiation 07/29/2016 Lymph node from 03/02/2018 biopsy: PDL1+ ------------------------------------------------------------------------------------------------------------------------------------------------ Current treatment:Pembrolizumab given every 3 weeks starting 04/03/18, todayiscycle 22 Keytruda toxicities:Hypothyroidism for which she required thyroid replacement therapy Currently on 121mg of Synthroid.  TSH level:11.6on 04/13/2019  Keytruda toxicities: 1.Severe scalp pain along the surgical scars very intense lasting for 48 hours improved with Aleve.Takes gabapentin, applies triamcinolone ointment 2.Right eye dryness: dexamethasone eyedrops. 3.Headaches:Brain MRI 04/14/2019. 4.Hypothyroidism:Synthroid1272m.   CT CAP: 06/08/2019:No evidence of recurrent or metastatic  disease.  Mild ileus, fluid-filled appendix same as before.  Brain MRI11/21/2020:Stable 3.7 mm nodule, unchanged Back pain: Flexeril refilled today.  Return to clinic every 3 weeks for Keytruda and I will see her again in 6 weeks.    No orders of the defined types were placed in this encounter.  The patient has a good understanding of the overall plan. she agrees with it. she will call with any problems that may develop before the next visit here.  Total time spent: 30 mins including face to face time and time spent for planning, charting and coordination of care  GuNicholas LoseMD 06/15/2019  I, MoCloyde Reamsorshimer, am acting as scribe for Dr. ViNicholas Lose I have reviewed the above documentation for accuracy and completeness, and I agree with the above.

## 2019-06-15 ENCOUNTER — Inpatient Hospital Stay: Payer: No Typology Code available for payment source

## 2019-06-15 ENCOUNTER — Inpatient Hospital Stay (HOSPITAL_BASED_OUTPATIENT_CLINIC_OR_DEPARTMENT_OTHER): Payer: No Typology Code available for payment source | Admitting: Hematology and Oncology

## 2019-06-15 ENCOUNTER — Other Ambulatory Visit: Payer: Self-pay

## 2019-06-15 ENCOUNTER — Inpatient Hospital Stay: Payer: No Typology Code available for payment source | Attending: Hematology and Oncology

## 2019-06-15 ENCOUNTER — Other Ambulatory Visit: Payer: Self-pay | Admitting: Hematology and Oncology

## 2019-06-15 VITALS — BP 137/95 | HR 70 | Temp 98.7°F | Resp 19 | Ht 63.0 in | Wt 184.3 lb

## 2019-06-15 DIAGNOSIS — C7931 Secondary malignant neoplasm of brain: Secondary | ICD-10-CM | POA: Insufficient documentation

## 2019-06-15 DIAGNOSIS — Z171 Estrogen receptor negative status [ER-]: Secondary | ICD-10-CM

## 2019-06-15 DIAGNOSIS — Z9221 Personal history of antineoplastic chemotherapy: Secondary | ICD-10-CM | POA: Diagnosis not present

## 2019-06-15 DIAGNOSIS — C50511 Malignant neoplasm of lower-outer quadrant of right female breast: Secondary | ICD-10-CM

## 2019-06-15 DIAGNOSIS — E039 Hypothyroidism, unspecified: Secondary | ICD-10-CM | POA: Insufficient documentation

## 2019-06-15 DIAGNOSIS — C792 Secondary malignant neoplasm of skin: Secondary | ICD-10-CM

## 2019-06-15 DIAGNOSIS — Z923 Personal history of irradiation: Secondary | ICD-10-CM | POA: Insufficient documentation

## 2019-06-15 DIAGNOSIS — M549 Dorsalgia, unspecified: Secondary | ICD-10-CM | POA: Diagnosis not present

## 2019-06-15 DIAGNOSIS — Z5112 Encounter for antineoplastic immunotherapy: Secondary | ICD-10-CM | POA: Diagnosis present

## 2019-06-15 DIAGNOSIS — Z79899 Other long term (current) drug therapy: Secondary | ICD-10-CM | POA: Insufficient documentation

## 2019-06-15 DIAGNOSIS — Z7952 Long term (current) use of systemic steroids: Secondary | ICD-10-CM | POA: Insufficient documentation

## 2019-06-15 DIAGNOSIS — C778 Secondary and unspecified malignant neoplasm of lymph nodes of multiple regions: Secondary | ICD-10-CM | POA: Insufficient documentation

## 2019-06-15 LAB — CMP (CANCER CENTER ONLY)
ALT: 8 U/L (ref 0–44)
AST: 14 U/L — ABNORMAL LOW (ref 15–41)
Albumin: 3.7 g/dL (ref 3.5–5.0)
Alkaline Phosphatase: 91 U/L (ref 38–126)
Anion gap: 9 (ref 5–15)
BUN: 11 mg/dL (ref 6–20)
CO2: 23 mmol/L (ref 22–32)
Calcium: 8.7 mg/dL — ABNORMAL LOW (ref 8.9–10.3)
Chloride: 108 mmol/L (ref 98–111)
Creatinine: 0.73 mg/dL (ref 0.44–1.00)
GFR, Est AFR Am: >60 mL/min (ref >60)
GFR, Estimated: >60 mL/min (ref >60)
Glucose, Bld: 91 mg/dL (ref 70–99)
Potassium: 3.7 mmol/L (ref 3.5–5.1)
Sodium: 140 mmol/L (ref 135–145)
Total Bilirubin: 0.5 mg/dL (ref 0.3–1.2)
Total Protein: 7 g/dL (ref 6.5–8.1)

## 2019-06-15 LAB — CBC WITH DIFFERENTIAL (CANCER CENTER ONLY)
Abs Immature Granulocytes: 0 10*3/uL (ref 0.00–0.07)
Basophils Absolute: 0 10*3/uL (ref 0.0–0.1)
Basophils Relative: 1 %
Eosinophils Absolute: 0.1 10*3/uL (ref 0.0–0.5)
Eosinophils Relative: 3 %
HCT: 39.9 % (ref 36.0–46.0)
Hemoglobin: 13.2 g/dL (ref 12.0–15.0)
Immature Granulocytes: 0 %
Lymphocytes Relative: 27 %
Lymphs Abs: 0.9 10*3/uL (ref 0.7–4.0)
MCH: 28.6 pg (ref 26.0–34.0)
MCHC: 33.1 g/dL (ref 30.0–36.0)
MCV: 86.4 fL (ref 80.0–100.0)
Monocytes Absolute: 0.2 10*3/uL (ref 0.1–1.0)
Monocytes Relative: 7 %
Neutro Abs: 2.1 10*3/uL (ref 1.7–7.7)
Neutrophils Relative %: 62 %
Platelet Count: 170 10*3/uL (ref 150–400)
RBC: 4.62 MIL/uL (ref 3.87–5.11)
RDW: 12.2 % (ref 11.5–15.5)
WBC Count: 3.3 10*3/uL — ABNORMAL LOW (ref 4.0–10.5)
nRBC: 0 % (ref 0.0–0.2)

## 2019-06-15 LAB — TSH: TSH: 0.244 u[IU]/mL — ABNORMAL LOW (ref 0.308–3.960)

## 2019-06-15 MED ORDER — SODIUM CHLORIDE 0.9 % IV SOLN
Freq: Once | INTRAVENOUS | Status: AC
  Filled 2019-06-15: qty 250

## 2019-06-15 MED ORDER — SODIUM CHLORIDE 0.9% FLUSH
10.0000 mL | INTRAVENOUS | Status: DC | PRN
  Administered 2019-06-15: 10 mL
  Filled 2019-06-15: qty 10

## 2019-06-15 MED ORDER — CYCLOBENZAPRINE HCL 5 MG PO TABS: 5.0000 mg | ORAL_TABLET | Freq: Three times a day (TID) | ORAL | 0 refills | Status: DC | PRN

## 2019-06-15 MED ORDER — GABAPENTIN 300 MG PO CAPS: 300.0000 mg | ORAL_CAPSULE | Freq: Three times a day (TID) | ORAL | Status: DC

## 2019-06-15 MED ORDER — SODIUM CHLORIDE 0.9 % IV SOLN
200.0000 mg | Freq: Once | INTRAVENOUS | Status: AC
  Administered 2019-06-15: 200 mg via INTRAVENOUS
  Filled 2019-06-15: qty 8

## 2019-06-15 MED ORDER — HEPARIN SOD (PORK) LOCK FLUSH 100 UNIT/ML IV SOLN
500.0000 [IU] | Freq: Once | INTRAVENOUS | Status: AC | PRN
  Administered 2019-06-15: 500 [IU]
  Filled 2019-06-15: qty 5

## 2019-06-15 MED ORDER — LEVOTHYROXINE SODIUM 125 MCG PO TABS: 137.0000 ug | ORAL_TABLET | Freq: Every day | ORAL | 0 refills | Status: DC

## 2019-06-15 NOTE — Patient Instructions (Signed)

## 2019-06-15 NOTE — Patient Instructions (Signed)
[facility] Discharge Instructions for Patients Receiving Chemotherapy  Today you received the following chemotherapy agents Pembrolizumab (KEYTRUDA).  To help prevent nausea and vomiting after your treatment, we encourage you to take your nausea medication as prescribed.   If you develop nausea and vomiting that is not controlled by your nausea medication, call the clinic.   BELOW ARE SYMPTOMS THAT SHOULD BE REPORTED IMMEDIATELY:  *FEVER GREATER THAN 100.5 F  *CHILLS WITH OR WITHOUT FEVER  NAUSEA AND VOMITING THAT IS NOT CONTROLLED WITH YOUR NAUSEA MEDICATION  *UNUSUAL SHORTNESS OF BREATH  *UNUSUAL BRUISING OR BLEEDING  TENDERNESS IN MOUTH AND THROAT WITH OR WITHOUT PRESENCE OF ULCERS  *URINARY PROBLEMS  *BOWEL PROBLEMS  UNUSUAL RASH Items with * indicate a potential emergency and should be followed up as soon as possible.  Feel free to call the clinic should you have any questions or concerns. The clinic phone number is [phone].  Please show the CHEMO ALERT CARD at check-in to the Emergency Department and triage nurse.   

## 2019-06-15 NOTE — Assessment & Plan Note (Signed)
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 Neoadj chemo 12/24/14- 04/29/15 AC x 4 foll by Abraxane X 12 Rt Lumpectomy: Path CR 0/2 LN Adj XRT 07/23/15- 09/08/15 PET/CT scan 11/17/2016:Subcutaneous nodules in the neck, upper back, left arm, abdomen and pelvis Patient progressed on Xeloda January 2019-07/04/2017 stopped due to progression of disease Cerebellar mass diagnosed 07/12/2016: Resection followed by stereotactic radiation 07/29/2016 Lymph node from 03/02/2018 biopsy: PDL1+ ------------------------------------------------------------------------------------------------------------------------------------------------ Current treatment:Pembrolizumab given every 3 weeks starting 04/03/18, todayiscycle 22 Keytruda toxicities:Hypothyroidism for which she required thyroid replacement therapy Currently on 121mg of Synthroid.  TSH level:11.6on 04/13/2019  Keytruda toxicities: 1.Severe scalp pain along the surgical scars very intense lasting for 48 hours improved with Aleve.Takes gabapentin, applies triamcinolone ointment 2.Right eye dryness: dexamethasone eyedrops. 3.Headaches:Brain MRI 04/14/2019. 4.Hypothyroidism:Synthroid1241m.   CT CAP: 06/08/2019:No evidence of recurrent or metastatic disease.  Mild ileus, fluid-filled appendix same as before.  Brain MRI11/21/2020:Stable 3.7 mm nodule, unchanged  Weight gain:On phentermine: She is losing weight steadily. Return to clinic every 3 weeks for Keytruda and I will see her again in 6 weeks.

## 2019-06-15 NOTE — Progress Notes (Signed)
We will reduce the dosage of Synthroid to 125 mcg daily

## 2019-06-22 ENCOUNTER — Other Ambulatory Visit: Payer: Self-pay | Admitting: Radiation Therapy

## 2019-06-22 ENCOUNTER — Encounter: Payer: Self-pay | Admitting: Radiation Therapy

## 2019-06-22 DIAGNOSIS — C7949 Secondary malignant neoplasm of other parts of nervous system: Secondary | ICD-10-CM

## 2019-06-22 DIAGNOSIS — C7931 Secondary malignant neoplasm of brain: Secondary | ICD-10-CM

## 2019-06-22 NOTE — Progress Notes (Signed)
Tried giving [name] a call to inform her of the upcoming brain MRI and telephone visits we have scheduled for her, but her voicemail box was full and I was unable to reach her. I sent an e-mail instead including my contact information.  See Below:

## 2019-06-27 ENCOUNTER — Telehealth: Payer: Self-pay | Admitting: Hematology and Oncology

## 2019-06-27 NOTE — Telephone Encounter (Signed)
Rescheduled per provider/ staff msg 2/12 Dr. Lindi Adie advised to schedule lab and tx on 2/12. Called and spoke with pt, confirmed 2/12 appt

## 2019-07-06 ENCOUNTER — Inpatient Hospital Stay: Payer: No Typology Code available for payment source | Attending: Hematology and Oncology

## 2019-07-06 ENCOUNTER — Inpatient Hospital Stay: Payer: No Typology Code available for payment source

## 2019-07-06 ENCOUNTER — Other Ambulatory Visit: Payer: Self-pay

## 2019-07-06 ENCOUNTER — Other Ambulatory Visit: Payer: BC Managed Care – PPO

## 2019-07-06 ENCOUNTER — Ambulatory Visit: Payer: BC Managed Care – PPO | Admitting: Hematology and Oncology

## 2019-07-06 VITALS — BP 142/92 | HR 61 | Temp 98.4°F | Resp 16

## 2019-07-06 DIAGNOSIS — C792 Secondary malignant neoplasm of skin: Secondary | ICD-10-CM

## 2019-07-06 DIAGNOSIS — C50511 Malignant neoplasm of lower-outer quadrant of right female breast: Secondary | ICD-10-CM | POA: Diagnosis not present

## 2019-07-06 DIAGNOSIS — C778 Secondary and unspecified malignant neoplasm of lymph nodes of multiple regions: Secondary | ICD-10-CM | POA: Insufficient documentation

## 2019-07-06 DIAGNOSIS — C7931 Secondary malignant neoplasm of brain: Secondary | ICD-10-CM | POA: Insufficient documentation

## 2019-07-06 DIAGNOSIS — C171 Malignant neoplasm of jejunum: Secondary | ICD-10-CM | POA: Insufficient documentation

## 2019-07-06 DIAGNOSIS — Z5112 Encounter for antineoplastic immunotherapy: Secondary | ICD-10-CM | POA: Diagnosis present

## 2019-07-06 LAB — CMP (CANCER CENTER ONLY)
ALT: 10 U/L (ref 0–44)
AST: 16 U/L (ref 15–41)
Albumin: 3.6 g/dL (ref 3.5–5.0)
Alkaline Phosphatase: 99 U/L (ref 38–126)
Anion gap: 6 (ref 5–15)
BUN: 13 mg/dL (ref 6–20)
CO2: 26 mmol/L (ref 22–32)
Calcium: 8.7 mg/dL — ABNORMAL LOW (ref 8.9–10.3)
Chloride: 108 mmol/L (ref 98–111)
Creatinine: 0.75 mg/dL (ref 0.44–1.00)
GFR, Est AFR Am: >60 mL/min (ref >60)
GFR, Estimated: >60 mL/min (ref >60)
Glucose, Bld: 94 mg/dL (ref 70–99)
Potassium: 3.8 mmol/L (ref 3.5–5.1)
Sodium: 140 mmol/L (ref 135–145)
Total Bilirubin: 0.6 mg/dL (ref 0.3–1.2)
Total Protein: 7 g/dL (ref 6.5–8.1)

## 2019-07-06 LAB — CBC WITH DIFFERENTIAL (CANCER CENTER ONLY)
Abs Immature Granulocytes: 0 10*3/uL (ref 0.00–0.07)
Basophils Absolute: 0 10*3/uL (ref 0.0–0.1)
Basophils Relative: 1 %
Eosinophils Absolute: 0.1 10*3/uL (ref 0.0–0.5)
Eosinophils Relative: 2 %
HCT: 40.2 % (ref 36.0–46.0)
Hemoglobin: 13.2 g/dL (ref 12.0–15.0)
Immature Granulocytes: 0 %
Lymphocytes Relative: 25 %
Lymphs Abs: 0.9 10*3/uL (ref 0.7–4.0)
MCH: 28.2 pg (ref 26.0–34.0)
MCHC: 32.8 g/dL (ref 30.0–36.0)
MCV: 85.9 fL (ref 80.0–100.0)
Monocytes Absolute: 0.4 10*3/uL (ref 0.1–1.0)
Monocytes Relative: 10 %
Neutro Abs: 2.2 10*3/uL (ref 1.7–7.7)
Neutrophils Relative %: 62 %
Platelet Count: 165 10*3/uL (ref 150–400)
RBC: 4.68 MIL/uL (ref 3.87–5.11)
RDW: 12.2 % (ref 11.5–15.5)
WBC Count: 3.6 10*3/uL — ABNORMAL LOW (ref 4.0–10.5)
nRBC: 0 % (ref 0.0–0.2)

## 2019-07-06 LAB — TSH: TSH: 0.8 u[IU]/mL (ref 0.308–3.960)

## 2019-07-06 MED ORDER — SODIUM CHLORIDE 0.9% FLUSH
10.0000 mL | INTRAVENOUS | Status: DC | PRN
  Administered 2019-07-06: 10 mL
  Filled 2019-07-06: qty 10

## 2019-07-06 MED ORDER — SODIUM CHLORIDE 0.9 % IV SOLN
Freq: Once | INTRAVENOUS | Status: AC
  Filled 2019-07-06: qty 250

## 2019-07-06 MED ORDER — SODIUM CHLORIDE 0.9 % IV SOLN
200.0000 mg | Freq: Once | INTRAVENOUS | Status: AC
  Administered 2019-07-06: 200 mg via INTRAVENOUS
  Filled 2019-07-06: qty 8

## 2019-07-06 MED ORDER — HEPARIN SOD (PORK) LOCK FLUSH 100 UNIT/ML IV SOLN
500.0000 [IU] | Freq: Once | INTRAVENOUS | Status: AC | PRN
  Administered 2019-07-06: 11:00:00 500 [IU]
  Filled 2019-07-06: qty 5

## 2019-07-06 NOTE — Patient Instructions (Signed)
[facility] Discharge Instructions for Patients Receiving Chemotherapy  Today you received the following chemotherapy agents:  Keytruda.  To help prevent nausea and vomiting after your treatment, we encourage you to take your nausea medication as directed.   If you develop nausea and vomiting that is not controlled by your nausea medication, call the clinic.   BELOW ARE SYMPTOMS THAT SHOULD BE REPORTED IMMEDIATELY:  *FEVER GREATER THAN 100.5 F  *CHILLS WITH OR WITHOUT FEVER  NAUSEA AND VOMITING THAT IS NOT CONTROLLED WITH YOUR NAUSEA MEDICATION  *UNUSUAL SHORTNESS OF BREATH  *UNUSUAL BRUISING OR BLEEDING  TENDERNESS IN MOUTH AND THROAT WITH OR WITHOUT PRESENCE OF ULCERS  *URINARY PROBLEMS  *BOWEL PROBLEMS  UNUSUAL RASH Items with * indicate a potential emergency and should be followed up as soon as possible.  Feel free to call the clinic should you have any questions or concerns. The clinic phone number is [phone].  Please show the CHEMO ALERT CARD at check-in to the Emergency Department and triage nurse.    

## 2019-07-21 ENCOUNTER — Ambulatory Visit
Admission: RE | Admit: 2019-07-21 | Discharge: 2019-07-21 | Disposition: A | Discharge status: Home / Self Care | Payer: No Typology Code available for payment source | Source: Ambulatory Visit | Attending: Radiation Oncology | Admitting: Radiation Oncology

## 2019-07-21 ENCOUNTER — Other Ambulatory Visit: Payer: No Typology Code available for payment source

## 2019-07-21 ENCOUNTER — Other Ambulatory Visit: Payer: Self-pay

## 2019-07-21 DIAGNOSIS — C7931 Secondary malignant neoplasm of brain: Secondary | ICD-10-CM

## 2019-07-21 MED ORDER — GADOBENATE DIMEGLUMINE 529 MG/ML IV SOLN
17.0000 mL | Freq: Once | INTRAVENOUS | Status: AC | PRN
  Administered 2019-07-21: 17 mL via INTRAVENOUS

## 2019-07-25 ENCOUNTER — Telehealth: Payer: Self-pay

## 2019-07-26 ENCOUNTER — Other Ambulatory Visit: Payer: Self-pay

## 2019-07-26 ENCOUNTER — Encounter: Payer: Self-pay | Admitting: Urology

## 2019-07-26 ENCOUNTER — Ambulatory Visit
Admission: RE | Admit: 2019-07-26 | Discharge: 2019-07-26 | Disposition: A | Discharge status: Home / Self Care | Payer: 59 | Source: Ambulatory Visit | Attending: Urology | Admitting: Urology

## 2019-07-26 DIAGNOSIS — C7931 Secondary malignant neoplasm of brain: Secondary | ICD-10-CM

## 2019-07-26 DIAGNOSIS — C7949 Secondary malignant neoplasm of other parts of nervous system: Secondary | ICD-10-CM

## 2019-07-26 NOTE — Progress Notes (Signed)
Patient Care Team: [name], MD as PCP - General (Hematology and Oncology) [name], [name], [name], [name], [name], [name], NP (Inactive) as Nurse Practitioner (Nurse Practitioner)  DIAGNOSIS:    ICD-10-CM   1. 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, estrogen receptor negative ([place])  C50.511    Z17.1     SUMMARY OF ONCOLOGIC HISTORY: Oncology History  Breast cancer of lower-outer quadrant of right female breast ([place])  12/03/2014 Mammogram   Right breast mass 1.9 cm it o'clock position 8 cm depth from the nipple   12/03/2014 Initial Diagnosis   Right breast biopsy 8:00: Invasive ductal carcinoma, grade 3, ER 0%, PR 0%, Ki-67 90%, HER-2 negative ratio 1.43   12/10/2014 Breast MRI   Right breast lower outer quadrant: 2.3 x 2.4 x 2.4 cm rim-enhancing mass abuts the pectoralis fascia but no enhancement of pectoralis muscle, second focus of artifact?'s second tissue marker clip, no lymph nodes   12/10/2014 Clinical Stage   Stage IIA: T2 N0   12/24/2014 - 04/29/2015 Neo-Adjuvant Chemotherapy   Dose dense Adriamycin and Cytoxan 4 followed by weekly Abraxane 12   05/02/2015 Breast MRI   complete radiologic response   06/16/2015 Surgery   Left Lumpectomy: Complete path Response, 0/2 LN   06/16/2015 Pathologic Stage   ypT0 ypN0   07/23/2015 - 09/05/2015 Radiation Therapy   Adjuvant RT: 50.4 Gy in 28 fractions and a boost of 10 Gy in 5 fractions to total dose of 60.4 Gy   10/24/2015 Survivorship   SCP mailed to patient in lieu of in person visit.   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 in BARD 1, BRCA2 and NF1   12/31/2016 Miscellaneous   Everolimus 10 mg daily for cycle 1 if she cannot tolerate will decrease to 7.5 mg daily   05/24/2017 - 07/04/2017 Chemotherapy   Xeloda 2000 mg 2 weeks on 1 week off stopped due to progression of disease based on CT scans done 06/27/2017   07/25/2017 - 03/13/2018 Chemotherapy   [place] days 1 and 8 every 3 weeks stopped for progression    03/20/2018 - 03/31/2018 Radiation Therapy   Radiation to lymph nodes   04/03/2018 -  Chemotherapy   Keytruda every 3 weeks      CHIEF COMPLIANT: Follow-up of metastatic breast canceronKeytruda  INTERVAL HISTORY: Kaitlyn Keith is a 54 y.o. with above-mentioned history of metastatic breast cancer who is currently receiving pembrolizumab immunotherapyevery 3 weeks.She presents to the clinic todayfor treatment.She had a couple of episodes of feeling hot and cold and fatigue.  She had an MRI  of the brain which was stable.  ALLERGIES:  has No Known Allergies.  MEDICATIONS:  Current Outpatient Medications  Medication Sig Dispense Refill  . alprazolam (XANAX) 2 MG tablet Take 1 tablet (2 mg total) by mouth at bedtime. 30 tablet 5  . betamethasone valerate ointment (VALISONE) 0.1 % Apply 1 application topically 2 (two) times daily. 30 g 1  .  cyclobenzaprine (FLEXERIL) 5 MG tablet Take 1 tablet (5 mg total) by mouth 3 (three) times daily as needed for muscle spasms. 30 tablet 0  . gabapentin (NEURONTIN) 300 MG capsule Take 1 capsule (300 mg total) by mouth 3 (three) times daily.    Marland Kitchen levothyroxine (SYNTHROID) 125 MCG tablet Take 1 tablet (125 mcg total) by mouth daily before breakfast. 30 tablet 0  . lidocaine-prilocaine (EMLA) cream APPLY TO AFFECTED AREA ONCE AS DIRECTED 30 g 3  . lisinopril-hydrochlorothiazide (PRINZIDE,ZESTORETIC) 20-12.5 MG tablet Take 1 tablet by mouth 2 (two) times daily. 180 tablet 3   No current facility-administered medications for this visit.    PHYSICAL EXAMINATION: ECOG PERFORMANCE STATUS: 1 - Symptomatic but completely ambulatory  Vitals:   07/27/19 0838  BP: (!) 135/95  Pulse: 70  Resp: 18  Temp: 98.2 F (36.8 C)  SpO2: 100%   Filed Weights   07/27/19 0838  Weight: 187 lb 9.6 oz (85.1 kg)    LABORATORY DATA:  I have reviewed the data as listed CMP Latest Ref Rng & Units 07/06/2019 06/15/2019 05/24/2019  Glucose 70 - 99 mg/dL 94 91 116(H)  BUN 6 - 20 mg/dL '13 11 13  ' Creatinine 0.44 - 1.00 mg/dL 0.75 0.73 0.78  Sodium 135 - 145 mmol/L 140 140 141  Potassium 3.5 - 5.1 mmol/L 3.8 3.7 3.3(L)  Chloride 98 - 111 mmol/L 108 108 105  CO2 22 - 32 mmol/L '26 23 27  ' Calcium 8.9 - 10.3 mg/dL 8.7(L) 8.7(L) 8.5(L)  Total Protein 6.5 - 8.1 g/dL 7.0 7.0 7.3  Total Bilirubin 0.3 - 1.2 mg/dL 0.6 0.5 0.4  Alkaline Phos 38 - 126 U/L 99 91 104  AST 15 - 41 U/L 16 14(L) 15  ALT 0 - 44 U/L '10 8 10    ' Lab Results  Component Value Date   WBC 4.0 07/27/2019   HGB 13.5 07/27/2019   HCT 41.9 07/27/2019   MCV 87.7 07/27/2019   PLT 165 07/27/2019   NEUTROABS 2.6 07/27/2019    ASSESSMENT & PLAN:  Breast cancer of lower-outer quadrant of right female breast ([place]) Right breast biopsy 12/03/2014 8:00: Invasive ductal carcinoma, grade 3, ER 0%, PR 0%, 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 Neoadj chemo 12/24/14- 04/29/15 AC x 4 foll by Abraxane X 12 Rt Lumpectomy: Path CR 0/2 LN Adj XRT 07/23/15- 09/08/15 PET/CT scan 11/17/2016:Subcutaneous nodules in the neck, upper back, left arm, abdomen and pelvis Patient progressed on Xeloda January 2019-07/04/2017 stopped due to progression of disease Cerebellar mass diagnosed 07/12/2016: Resection followed by stereotactic radiation 07/29/2016 Lymph node from 03/02/2018 biopsy: PDL1+ ------------------------------------------------------------------------------------------------------------------------------------------------ Current treatment:Pembrolizumab given every 3 weeks starting 04/03/18, todayiscycle 22 Keytruda toxicities:Hypothyroidism for which she required thyroid replacement therapy Currently on 162mg of Synthroid.    Keytruda toxicities: 1.Severe scalp pain along the surgical scars very intense lasting for 48 hours improved with Aleve.Takes gabapentin, applies triamcinolone ointment 2.Right eye dryness: dexamethasone eyedrops. 3.Hypothyroidism:Synthroid1226m.   CT CAP:  06/08/2019:No evidence of recurrent or metastatic disease.  Mild ileus, fluid-filled appendix same as before.  Brain MRIFebruary 2021:Stable 3.7 mm nodule, unchanged    Return to clinic every 3 weeks for [facility] and I will see her again in 6 weeks.    No orders of the defined types were placed in this encounter.  The patient has a good understanding of the overall plan. she agrees with it. she will call with any problems that may develop before the next visit here.  Total time spent: 30 mins including face to face time and time spent for planning, charting and coordination of care  [name], MD 07/27/2019  I, Cloyde Reams Dorshimer, am acting as scribe for Dr. [name].  I have reviewed the above documentation for accuracy  and completeness, and I agree with the above.

## 2019-07-26 NOTE — Progress Notes (Signed)
Radiation Oncology         [phone]) [phone] ________________________________  Name: Kaitlyn Keith MRN: [id number]  Date: 07/26/2019  DOB: 1965-05-27  Follow-Up Visit Note  CC: [name], MD  [name], MD  Diagnosis:    54 y.o. 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        ICD-10-CM   1. Secondary malignant neoplasm of brain and spinal cord (HCC)  C79.31    C79.49   2. 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 Pelvis / 30 Gy in 10 fractions (right inguinal nodes) 2. Left Supraclavicular / 30 Gy in 10 fractions 3. Abdomen / 20 Gy in 8 fractions (subcutaneous Met)  07/11/2017 - 07/22/2017 palliative XRT for painful subcutaneous nodules ([place]) 1. Left Flank / 30 Gy in 10 fractions 2. Left Groin / 30 Gy in 10 fractions  07/26/16 Preop SRS Treatment Tammi Klipp[facility]): PTV1 Right Cerebellum was treated to 18 Gy in 1 fraction  07/23/15-09/05/15 ([place]): 50.4 Gy to the right breast + 10 Gy boost  Narrative:  I spoke with the patient to conduct her routine scheduled 3 month follow up visit to review recent MRI brain via telephone to spare the patient unnecessary potential exposure in the healthcare setting during the current COVID-19 pandemic.  The patient was notified in advance and gave permission to proceed with this visit format. Kaitlyn Keith is a pleasant 54 y.o. 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 She was diagnosed with her cancer in July 2016, and underwent neoadjuvant chemotherapy followed by right lumpectomy and adjuvant radiation. She was found to have recurrent disease in February 2018 with a right cerebellar mass, and  underwent preoperative SRS treatment followed by surgical resection on 07/29/2016.   She developed multiple subcutaneous nodules in the neck, upper back, left arm, abdomen and pelvis, noted on PET scan from 11/17/16. She was started on Everolimus on 12/31/16 but was discontinued 04/11/17 due to disease progression with interval development of subcutaneous nodules in the ventral lower left pelvic wall and left flank and interval growth of 3 scattered peritoneal metastases. She started Xeloda in 05/2017 but discontinued due to disease progression on re-staging scans from 06/27/17 which showed a mixed response to therapy with some regression of the previously noted intraperitoneal implants but other lesions with significant interval growth including subcutaneous nodules in the left flank, left vulvar region, right inguinal LAN and anterior mediastinal LAN. She elected to move forward with palliative XRT to 2 painful subcutaneous lesions in the left flank and left pubic/vulvar region which was completed in March 2019.  She tolerated radiotherapy very well and had an excellent response with decreased size of both treated lesions.  Her systemic chemotherapy was switched to [facility] but this was discontinued in October 2019 due to evidence of disease progression on follow-up CT C/A/P from 02/02/2018 indicating continued progression of right inguinal lymph node and right inguinal lymphadenopathy.  She underwent a CT-guided biopsy of the right inguinal lymph node on 03/02/2018 with final pathology confirming metastatic, poorly differentiated carcinoma, ER/PR negative and HER-2 negative.  Her systemic therapy was changed to pembrolizumab immunotherapy at that time and she was also  referred back to radiation oncology for consideration of palliative radiotherapy to the painful, progressive right inguinal lymphadenopathy.  At the time of her consult on 03/15/2018, she also mentioned that she had recently developed pain in the left  supraclavicular region as well as in the left flank area.  She elected to proceed with palliative radiotherapy to the 3 sites of painful metastatic disease in the right inguinal, left supraclavicular and left abdomen/flank and this was completed on 03/31/2018.  She tolerated the radiation well and did have significant improvement in her pain.  She has now completed 22 cycles of Keytruda (pembrolizumab) and her most recent CT C/A/P from 06/08/19 showed complete resolution of metastatic focus in the right inguinal/groin region as well as complete resolution of the left supraclavicular lymphadenopathy. Stable 6 mm hypodense lesion in the liver. No current evidence of active malignancy. She has continued in routine follow up with Dr. Lindi Adie, last seen on 06/15/19 and the recommendation is to continue with her current immunotherapy with [facility] every 3 weeks.  She has also continued being followed in our multidisciplinary brain tumor conference, and her last MRI scan on 07/21/19 was recently reviewed in brain tumor board on Monday 07/23/19 and revealed a stable 3 mm enhancing nodule in the right lateral cerebellum at the site of previous tumor treatment, felt to be consistent with treatment related changes especially in light of its stable appearance. unlikely to be disease recurrence. There were no other enhancing lesions. Today's visit is to review these findings.  On review of systems, the patient reports that she is doing well overall. She denies any chest pain, shortness of breath, cough, fevers, chills, night sweats, or unintended weight changes. She denies any bowel or bladder disturbances, and denies abdominal pain, nausea or vomiting. She denies new visual or auditory changes, dizziness, imbalance, tremors or seizure activity. She has continued having occasional headaches which respond to tylenol or advil if needed.  The headaches became severe back in 10/2018 and she ended up having a wisdom tooth extraction with  significant improvement of her HAs. She has some chronic pain in the left groin/hip joint region that radiates down her left leg anteriorly to her foot.  This pain is exacerbated with activities and improves with rest and does not wake her from sleep at night. An MRI of the hip in October 2020 was without evidence of bony metastatic disease or acute abnormalities.  She reports mild improvement with taking muscle relaxants prn. She denies any new skin lesions or concerns. A complete review of systems is obtained and is otherwise negative.  Past Medical History:  Past Medical History:  Diagnosis Date  . Anxiety   . Arthritis   . Back pain   . Brain cancer ([place])    brian met from triple negative breast ca  . Breast cancer ([place])   . Breast cancer of lower-outer quadrant of right female breast ([place]) 12/05/2014  . Depression   . FH: chemotherapy 12/2014-04/2015  . Genetic testing 12/10/2016   Kaitlyn Keith underwent genetic counseling and testing for hereditary cancer syndromes on 11/18/2016. 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 Mutations in MSH6 are associated with a hereditary cancer syndrome called Lynch syndrome. For more detailed discussion, please see genetic counseling documentation from 12/10/2016.  Testing was perfo  . Headache    due to brain cancer, no longer having them  . Hot flashes   . Hypertension   . MSH6-related Lynch syndrome (HNPCC5)  12/10/2016   Kaitlyn Keith underwent genetic counseling and testing for hereditary cancer syndromes on 11/18/2016. 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 Mutations in MSH6 are associated with a hereditary cancer syndrome called Lynch syndrome. For more detailed discussion, please see genetic counseling documentation from 12/10/2016.  Testing was perfo  . 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 Radiation  07/23/15-09/05/15   right breast 50.4 Gy, boost of 10 Gy    Past Surgical History: Past Surgical History:  Procedure Laterality Date  . APPLICATION OF CRANIAL NAVIGATION Right 07/27/2016   Procedure: APPLICATION OF CRANIAL NAVIGATION;  Surgeon: Kevan Ny Ditty, MD;  Location: [place];  Service: Neurosurgery;  Laterality: Right;  . BIOPSY OF SKIN SUBCUTANEOUS TISSUE AND/OR MUCOUS MEMBRANE Left 12/24/2016   Procedure: OPEN BIOPSY LESIONS ON LEFT LOWER BACK AND SHOULDER BLADE;  Surgeon: [name], MD;  Location: [place];  Service: General;  Laterality: Left;  . BREAST LUMPECTOMY WITH NEEDLE LOCALIZATION AND AXILLARY SENTINEL LYMPH NODE BX Right 06/16/2015   Procedure: BREAST LUMPECTOMY WITH NEEDLE LOCALIZATION AND AXILLARY SENTINEL LYMPH NODE BX;  Surgeon: [name], MD;  Location: [place];  Service: General;  Laterality: Right;  . BREAST REDUCTION SURGERY    . CRANIOTOMY Right 07/27/2016   Procedure: Right Suboccipital craniotomy for tumor resection with stereotactic navigation;  Surgeon: Kevan Ny Ditty, MD;  Location: [place];  Service: Neurosurgery;  Laterality: Right;  . PORT-A-CATH REMOVAL N/A 06/16/2015   Procedure: REMOVAL PORT-A-CATH;  Surgeon: [name], MD;  Location: [place];  Service: General;  Laterality: N/A;  . PORTACATH PLACEMENT N/A 12/23/2014   Procedure: INSERTION PORT-A-CATH;  Surgeon: [name], MD;  Location: [place];  Service: General;  Laterality: N/A;  . PORTACATH PLACEMENT Left 07/08/2017   Procedure: INSERTION PORT-A-CATH;  Surgeon: [name], MD;  Location: [place];  Service: General;  Laterality: Left;    Social History:  Social History   Socioeconomic History  . Marital status: Divorced    Spouse name: Not on file  . Number of children: 1  . Years of education: Not on file  . Highest education level: Not on file  Occupational History  . Not on file  Tobacco Use  .  Smoking status: Never Smoker  . Smokeless tobacco: Never Used  Substance and Sexual Activity  . Alcohol use: Yes    Comment: social  . Drug use: No  . Sexual activity: Yes  Other Topics Concern  . Not on file  Social History Narrative  . Not on file   Social Determinants of Health   Financial Resource Strain:   . Difficulty of Paying Living Expenses: Not on file  Food Insecurity:   . Worried About Charity fundraiser in the Last Year: Not on file  . Ran Out of Food in the Last Year: Not on file  Transportation Needs:   . Lack of Transportation (Medical): Not on file  . Lack of Transportation (Non-Medical): Not on file  Physical Activity:   . Days of Exercise per Week: Not on file  . Minutes of Exercise per Session: Not on file  Stress:   . Feeling of Stress : Not on file  Social Connections:   . Frequency of Communication with Friends and Family: Not on file  . Frequency of Social Gatherings with Friends and Family: Not on file  .  Attends Religious Services: Not on file  . Active Member of Clubs or Organizations: Not on file  . Attends Archivist Meetings: Not on file  . Marital Status: Not on file  Intimate Partner Violence:   . Fear of Current or Ex-Partner: Not on file  . Emotionally Abused: Not on file  . Physically Abused: Not on file  . Sexually Abused: Not on file  The patient is divorced. She has worked for [employer] firm, and continues working from home full time.  Family History: Family History  Problem Relation Age of Onset  . Aneurysm Mother 58       d.55  . Heart attack Father 55       d.62  . Endometrial cancer Sister 41  . Lung cancer Maternal Uncle        d.68s  . Cancer Maternal Grandmother        unspecified type-possibly stomach d.88s                          ALLERGIES:  has No Known Allergies.  Meds: Current Outpatient Medications  Medication Sig Dispense Refill  . alprazolam (XANAX) 2 MG tablet Take 1 tablet (2 mg  total) by mouth at bedtime. 30 tablet 5  . betamethasone valerate ointment (VALISONE) 0.1 % Apply 1 application topically 2 (two) times daily. 30 g 1  . cyclobenzaprine (FLEXERIL) 5 MG tablet Take 1 tablet (5 mg total) by mouth 3 (three) times daily as needed for muscle spasms. 30 tablet 0  . gabapentin (NEURONTIN) 300 MG capsule Take 1 capsule (300 mg total) by mouth 3 (three) times daily.    Marland Kitchen levothyroxine (SYNTHROID) 125 MCG tablet Take 1 tablet (125 mcg total) by mouth daily before breakfast. 30 tablet 0  . lidocaine-prilocaine (EMLA) cream APPLY TO AFFECTED AREA ONCE AS DIRECTED 30 g 3  . lisinopril-hydrochlorothiazide (PRINZIDE,ZESTORETIC) 20-12.5 MG tablet Take 1 tablet by mouth 2 (two) times daily. 180 tablet 3   No current facility-administered medications for this encounter.    Physical Findings:   vitals were not taken for this visit.    Unable to assess due to telephone visit format.   Lab Findings: Lab Results  Component Value Date   WBC 3.6 (L) 07/06/2019   WBC 3.0 (L) 03/02/2018   HGB 13.2 07/06/2019   HGB 11.7 04/04/2017   HCT 40.2 07/06/2019   HCT 35.9 04/04/2017   PLT 165 07/06/2019   PLT 207 04/04/2017    Lab Results  Component Value Date   NA 140 07/06/2019   NA 139 04/04/2017   K 3.8 07/06/2019   K 3.7 04/04/2017   CHLORIDE 108 04/04/2017   CO2 26 07/06/2019   CO2 24 04/04/2017   GLUCOSE 94 07/06/2019   GLUCOSE 76 04/04/2017   BUN 13 07/06/2019   BUN 5.8 (L) 04/04/2017   CREATININE 0.75 07/06/2019   CREATININE 0.8 04/04/2017   BILITOT 0.6 07/06/2019   BILITOT 0.43 04/04/2017   ALKPHOS 99 07/06/2019   ALKPHOS 131 04/04/2017   AST 16 07/06/2019   AST 18 04/04/2017   ALT 10 07/06/2019   ALT 12 04/04/2017   PROT 7.0 07/06/2019   PROT 7.0 04/04/2017   ALBUMIN 3.6 07/06/2019   ALBUMIN 3.0 (L) 04/04/2017   CALCIUM 8.7 (L) 07/06/2019   CALCIUM 8.6 04/04/2017   ANIONGAP 6 07/06/2019    Radiographic Findings: MR Brain W Wo Contrast  Result  Date: 07/21/2019 CLINICAL DATA:  Metastatic breast cancer. Postop resection and SRS right cerebellar lesion. EXAM: MRI HEAD WITHOUT AND WITH CONTRAST TECHNIQUE: Multiplanar, multiecho pulse sequences of the brain and surrounding structures were obtained without and with intravenous contrast. CONTRAST:  75m MULTIHANCE GADOBENATE DIMEGLUMINE 529 MG/ML IV SOLN COMPARISON:  MRI head 04/14/2019 FINDINGS: Brain: Encephalomalacia right lateral cerebellum due to prior resection of tumor is stable. 4 mm enhancing nodule within the right cerebellar encephalomalacia also stable. No new or enlarging metastatic deposits in the brain. Negative for acute infarct. Negative for hemorrhage or midline shift. Vascular: Normal arterial flow voids. Skull and upper cervical spine: 5 mm anterolisthesis C2-3 unchanged. No skull lesions identified. Sinuses/Orbits: Mild mucosal edema in the ethmoid sinuses. Normal orbit Other: None IMPRESSION: Stable MRI. Postop encephalomalacia right lateral cerebellum is stable. 4 mm enhancing nodule within this area of encephalomalacia also stable. No new or enlarging metastatic deposits in the brain. Electronically Signed   By: CFranchot GalloM.D.   On: 07/21/2019 11:42    Impression/Plan: 1. 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  Her most recent MRI brain from 07/21/19 was recently reviewed with the multidisciplinary tumor board on Monday 07/23/19 and revealed stability of the 3 mm enhancing nodule in the right lateral cerebellum at the site of previous tumor treatment, felt to be consistent with treatment related changes, especially given the stable appearance, unlikely to be disease recurrence. There were no other enhancing lesions. The recommendation is to continue with surveillance MRI every 6 months and a follow up visit thereafter to review results and recommendations from tumor board. Her most recent systemic imaging from  06/08/19 indicates a positive response to systemic treatment and recent palliative radiotherapy.  She met with her medical oncologist, Dr. GLindi Adiein follow-up on 06/15/19 and the recommendation is to continue with Keytruda q 3 weeks for systemic disease management as she is tolerating this well. She will have repeat CT scans for systemic disease restaging under the care and direction of Dr. GLindi Adie She knows to call [place] any questions or concerns in the interim and is comfortable and in agreement with this plan.  I spent 25 minutes in telephone conversation with the patient and more than 50% of that time was spent in counseling and/or coordination of care.    ANicholos Johns PA-C

## 2019-07-27 ENCOUNTER — Inpatient Hospital Stay: Payer: No Typology Code available for payment source

## 2019-07-27 ENCOUNTER — Inpatient Hospital Stay (HOSPITAL_BASED_OUTPATIENT_CLINIC_OR_DEPARTMENT_OTHER): Payer: No Typology Code available for payment source | Admitting: Hematology and Oncology

## 2019-07-27 ENCOUNTER — Other Ambulatory Visit: Payer: Self-pay

## 2019-07-27 ENCOUNTER — Inpatient Hospital Stay: Payer: No Typology Code available for payment source | Attending: Hematology and Oncology

## 2019-07-27 VITALS — BP 127/80

## 2019-07-27 DIAGNOSIS — Z171 Estrogen receptor negative status [ER-]: Secondary | ICD-10-CM | POA: Insufficient documentation

## 2019-07-27 DIAGNOSIS — E039 Hypothyroidism, unspecified: Secondary | ICD-10-CM | POA: Diagnosis not present

## 2019-07-27 DIAGNOSIS — C792 Secondary malignant neoplasm of skin: Secondary | ICD-10-CM

## 2019-07-27 DIAGNOSIS — Z79899 Other long term (current) drug therapy: Secondary | ICD-10-CM | POA: Insufficient documentation

## 2019-07-27 DIAGNOSIS — C50511 Malignant neoplasm of lower-outer quadrant of right female breast: Secondary | ICD-10-CM | POA: Diagnosis not present

## 2019-07-27 DIAGNOSIS — Z5112 Encounter for antineoplastic immunotherapy: Secondary | ICD-10-CM | POA: Insufficient documentation

## 2019-07-27 DIAGNOSIS — Z9221 Personal history of antineoplastic chemotherapy: Secondary | ICD-10-CM | POA: Insufficient documentation

## 2019-07-27 DIAGNOSIS — Z923 Personal history of irradiation: Secondary | ICD-10-CM | POA: Diagnosis not present

## 2019-07-27 LAB — CMP (CANCER CENTER ONLY)
ALT: 13 U/L (ref 0–44)
AST: 19 U/L (ref 15–41)
Albumin: 3.8 g/dL (ref 3.5–5.0)
Alkaline Phosphatase: 106 U/L (ref 38–126)
Anion gap: 7 (ref 5–15)
BUN: 19 mg/dL (ref 6–20)
CO2: 26 mmol/L (ref 22–32)
Calcium: 8.9 mg/dL (ref 8.9–10.3)
Chloride: 106 mmol/L (ref 98–111)
Creatinine: 0.81 mg/dL (ref 0.44–1.00)
GFR, Est AFR Am: >60 mL/min (ref >60)
GFR, Estimated: >60 mL/min (ref >60)
Glucose, Bld: 102 mg/dL — ABNORMAL HIGH (ref 70–99)
Potassium: 3.6 mmol/L (ref 3.5–5.1)
Sodium: 139 mmol/L (ref 135–145)
Total Bilirubin: 0.6 mg/dL (ref 0.3–1.2)
Total Protein: 7.3 g/dL (ref 6.5–8.1)

## 2019-07-27 LAB — CBC WITH DIFFERENTIAL (CANCER CENTER ONLY)
Abs Immature Granulocytes: 0.01 10*3/uL (ref 0.00–0.07)
Basophils Absolute: 0 10*3/uL (ref 0.0–0.1)
Basophils Relative: 1 %
Eosinophils Absolute: 0 10*3/uL (ref 0.0–0.5)
Eosinophils Relative: 1 %
HCT: 41.9 % (ref 36.0–46.0)
Hemoglobin: 13.5 g/dL (ref 12.0–15.0)
Immature Granulocytes: 0 %
Lymphocytes Relative: 25 %
Lymphs Abs: 1 10*3/uL (ref 0.7–4.0)
MCH: 28.2 pg (ref 26.0–34.0)
MCHC: 32.2 g/dL (ref 30.0–36.0)
MCV: 87.7 fL (ref 80.0–100.0)
Monocytes Absolute: 0.3 10*3/uL (ref 0.1–1.0)
Monocytes Relative: 8 %
Neutro Abs: 2.6 10*3/uL (ref 1.7–7.7)
Neutrophils Relative %: 65 %
Platelet Count: 165 10*3/uL (ref 150–400)
RBC: 4.78 MIL/uL (ref 3.87–5.11)
RDW: 12.6 % (ref 11.5–15.5)
WBC Count: 4 10*3/uL (ref 4.0–10.5)
nRBC: 0 % (ref 0.0–0.2)

## 2019-07-27 LAB — TSH: TSH: 2.211 u[IU]/mL (ref 0.308–3.960)

## 2019-07-27 MED ORDER — SODIUM CHLORIDE 0.9% FLUSH
10.0000 mL | INTRAVENOUS | Status: DC | PRN
  Administered 2019-07-27: 10 mL
  Filled 2019-07-27: qty 10

## 2019-07-27 MED ORDER — HEPARIN SOD (PORK) LOCK FLUSH 100 UNIT/ML IV SOLN
500.0000 [IU] | Freq: Once | INTRAVENOUS | Status: DC | PRN
  Filled 2019-07-27: qty 5

## 2019-07-27 MED ORDER — SODIUM CHLORIDE 0.9 % IV SOLN
Freq: Once | INTRAVENOUS | Status: AC
  Filled 2019-07-27: qty 250

## 2019-07-27 MED ORDER — SODIUM CHLORIDE 0.9% FLUSH
10.0000 mL | INTRAVENOUS | Status: DC | PRN
  Filled 2019-07-27: qty 10

## 2019-07-27 MED ORDER — SODIUM CHLORIDE 0.9 % IV SOLN
200.0000 mg | Freq: Once | INTRAVENOUS | Status: AC
  Administered 2019-07-27: 200 mg via INTRAVENOUS
  Filled 2019-07-27: qty 8

## 2019-07-27 NOTE — Assessment & Plan Note (Signed)
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 Neoadj chemo 12/24/14- 04/29/15 AC x 4 foll by Abraxane X 12 Rt Lumpectomy: Path CR 0/2 LN Adj XRT 07/23/15- 09/08/15 PET/CT scan 11/17/2016:Subcutaneous nodules in the neck, upper back, left arm, abdomen and pelvis Patient progressed on Xeloda January 2019-07/04/2017 stopped due to progression of disease Cerebellar mass diagnosed 07/12/2016: Resection followed by stereotactic radiation 07/29/2016 Lymph node from 03/02/2018 biopsy: PDL1+ ------------------------------------------------------------------------------------------------------------------------------------------------ Current treatment:Pembrolizumab given every 3 weeks starting 04/03/18, todayiscycle 22 Keytruda toxicities:Hypothyroidism for which she required thyroid replacement therapy Currently on 126mg of Synthroid.    Keytruda toxicities: 1.Severe scalp pain along the surgical scars very intense lasting for 48 hours improved with Aleve.Takes gabapentin, applies triamcinolone ointment 2.Right eye dryness: dexamethasone eyedrops. 3.Hypothyroidism:Synthroid1241m.   CT CAP: 06/08/2019:No evidence of recurrent or metastatic disease.  Mild ileus, fluid-filled appendix same as before.  Brain MRI11/21/2020:Stable 3.7 mm nodule, unchanged    [facility] will see her again in 6 weeks.

## 2019-07-27 NOTE — Patient Instructions (Signed)
[facility] Discharge Instructions for Patients Receiving Chemotherapy  Today you received the following chemotherapy agents:  Keytruda.  To help prevent nausea and vomiting after your treatment, we encourage you to take your nausea medication as directed.   If you develop nausea and vomiting that is not controlled by your nausea medication, call the clinic.   BELOW ARE SYMPTOMS THAT SHOULD BE REPORTED IMMEDIATELY:  *FEVER GREATER THAN 100.5 F  *CHILLS WITH OR WITHOUT FEVER  NAUSEA AND VOMITING THAT IS NOT CONTROLLED WITH YOUR NAUSEA MEDICATION  *UNUSUAL SHORTNESS OF BREATH  *UNUSUAL BRUISING OR BLEEDING  TENDERNESS IN MOUTH AND THROAT WITH OR WITHOUT PRESENCE OF ULCERS  *URINARY PROBLEMS  *BOWEL PROBLEMS  UNUSUAL RASH Items with * indicate a potential emergency and should be followed up as soon as possible.  Feel free to call the clinic should you have any questions or concerns. The clinic phone number is [phone].  Please show the CHEMO ALERT CARD at check-in to the Emergency Department and triage nurse.    

## 2019-07-30 ENCOUNTER — Telehealth: Payer: Self-pay | Admitting: Hematology and Oncology

## 2019-07-30 NOTE — Telephone Encounter (Signed)
I talk with patient regarding schedule  

## 2019-08-02 NOTE — Telephone Encounter (Signed)
Spoke with patient regarding phone encounter on 07/26/19 for MRI resutlts patient verbalize she understood

## 2019-08-13 NOTE — Progress Notes (Signed)
Pharmacist Chemotherapy Monitoring - Follow Up Assessment    I verify that I have reviewed each item in the below checklist:  . Regimen for the patient is scheduled for the appropriate day and plan matches scheduled date. Marland Kitchen Appropriate non-routine labs are ordered dependent on drug ordered. . If applicable, additional medications reviewed and ordered per protocol based on lifetime cumulative doses and/or treatment regimen.   Plan for follow-up and/or issues identified: No . I-vent associated with next due treatment: No . MD and/or nursing notified: No  Kaitlyn Keith [facility] 08/13/2019 9:36 AM

## 2019-08-14 ENCOUNTER — Telehealth: Payer: Self-pay | Admitting: Hematology and Oncology

## 2019-08-14 NOTE — Telephone Encounter (Signed)
Called pt per 3/23 sch message- called but got disconnected unable to reach pt . Pt to get an updated schedule next visit.

## 2019-08-16 ENCOUNTER — Other Ambulatory Visit: Payer: Self-pay | Admitting: Radiation Therapy

## 2019-08-16 DIAGNOSIS — C7931 Secondary malignant neoplasm of brain: Secondary | ICD-10-CM

## 2019-08-17 ENCOUNTER — Other Ambulatory Visit: Payer: BC Managed Care – PPO

## 2019-08-17 ENCOUNTER — Inpatient Hospital Stay: Payer: No Typology Code available for payment source

## 2019-08-17 ENCOUNTER — Ambulatory Visit: Payer: BC Managed Care – PPO

## 2019-08-17 ENCOUNTER — Ambulatory Visit: Payer: BC Managed Care – PPO | Admitting: Hematology and Oncology

## 2019-08-17 ENCOUNTER — Other Ambulatory Visit: Payer: Self-pay

## 2019-08-17 VITALS — BP 147/97 | HR 61 | Temp 98.5°F | Resp 18

## 2019-08-17 DIAGNOSIS — C50511 Malignant neoplasm of lower-outer quadrant of right female breast: Secondary | ICD-10-CM | POA: Diagnosis not present

## 2019-08-17 DIAGNOSIS — C792 Secondary malignant neoplasm of skin: Secondary | ICD-10-CM

## 2019-08-17 LAB — CMP (CANCER CENTER ONLY)
ALT: 11 U/L (ref 0–44)
AST: 17 U/L (ref 15–41)
Albumin: 3.7 g/dL (ref 3.5–5.0)
Alkaline Phosphatase: 105 U/L (ref 38–126)
Anion gap: 9 (ref 5–15)
BUN: 10 mg/dL (ref 6–20)
CO2: 26 mmol/L (ref 22–32)
Calcium: 8.7 mg/dL — ABNORMAL LOW (ref 8.9–10.3)
Chloride: 106 mmol/L (ref 98–111)
Creatinine: 0.71 mg/dL (ref 0.44–1.00)
GFR, Est AFR Am: >60 mL/min (ref >60)
GFR, Estimated: >60 mL/min (ref >60)
Glucose, Bld: 97 mg/dL (ref 70–99)
Potassium: 3.6 mmol/L (ref 3.5–5.1)
Sodium: 141 mmol/L (ref 135–145)
Total Bilirubin: 0.6 mg/dL (ref 0.3–1.2)
Total Protein: 7.3 g/dL (ref 6.5–8.1)

## 2019-08-17 LAB — CBC WITH DIFFERENTIAL (CANCER CENTER ONLY)
Abs Immature Granulocytes: 0.01 10*3/uL (ref 0.00–0.07)
Basophils Absolute: 0 10*3/uL (ref 0.0–0.1)
Basophils Relative: 1 %
Eosinophils Absolute: 0.1 10*3/uL (ref 0.0–0.5)
Eosinophils Relative: 1 %
HCT: 39.5 % (ref 36.0–46.0)
Hemoglobin: 12.9 g/dL (ref 12.0–15.0)
Immature Granulocytes: 0 %
Lymphocytes Relative: 26 %
Lymphs Abs: 1 10*3/uL (ref 0.7–4.0)
MCH: 28.5 pg (ref 26.0–34.0)
MCHC: 32.7 g/dL (ref 30.0–36.0)
MCV: 87.4 fL (ref 80.0–100.0)
Monocytes Absolute: 0.3 10*3/uL (ref 0.1–1.0)
Monocytes Relative: 9 %
Neutro Abs: 2.4 10*3/uL (ref 1.7–7.7)
Neutrophils Relative %: 63 %
Platelet Count: 170 10*3/uL (ref 150–400)
RBC: 4.52 MIL/uL (ref 3.87–5.11)
RDW: 12.9 % (ref 11.5–15.5)
WBC Count: 3.8 10*3/uL — ABNORMAL LOW (ref 4.0–10.5)
nRBC: 0 % (ref 0.0–0.2)

## 2019-08-17 MED ORDER — HEPARIN SOD (PORK) LOCK FLUSH 100 UNIT/ML IV SOLN
500.0000 [IU] | Freq: Once | INTRAVENOUS | Status: AC | PRN
  Administered 2019-08-17: 500 [IU]
  Filled 2019-08-17: qty 5

## 2019-08-17 MED ORDER — SODIUM CHLORIDE 0.9% FLUSH
10.0000 mL | INTRAVENOUS | Status: DC | PRN
  Filled 2019-08-17: qty 10

## 2019-08-17 MED ORDER — SODIUM CHLORIDE 0.9 % IV SOLN
200.0000 mg | Freq: Once | INTRAVENOUS | Status: AC
  Administered 2019-08-17: 16:00:00 200 mg via INTRAVENOUS
  Filled 2019-08-17: qty 8

## 2019-08-17 MED ORDER — SODIUM CHLORIDE 0.9% FLUSH
10.0000 mL | INTRAVENOUS | Status: DC | PRN
  Administered 2019-08-17: 16:00:00 10 mL
  Filled 2019-08-17: qty 10

## 2019-08-17 MED ORDER — SODIUM CHLORIDE 0.9 % IV SOLN
Freq: Once | INTRAVENOUS | Status: AC
  Filled 2019-08-17: qty 250

## 2019-08-17 NOTE — Patient Instructions (Signed)
[facility] Discharge Instructions for Patients Receiving Chemotherapy  Today you received the following chemotherapy agents:  Keytruda.  To help prevent nausea and vomiting after your treatment, we encourage you to take your nausea medication as directed.   If you develop nausea and vomiting that is not controlled by your nausea medication, call the clinic.   BELOW ARE SYMPTOMS THAT SHOULD BE REPORTED IMMEDIATELY:  *FEVER GREATER THAN 100.5 F  *CHILLS WITH OR WITHOUT FEVER  NAUSEA AND VOMITING THAT IS NOT CONTROLLED WITH YOUR NAUSEA MEDICATION  *UNUSUAL SHORTNESS OF BREATH  *UNUSUAL BRUISING OR BLEEDING  TENDERNESS IN MOUTH AND THROAT WITH OR WITHOUT PRESENCE OF ULCERS  *URINARY PROBLEMS  *BOWEL PROBLEMS  UNUSUAL RASH Items with * indicate a potential emergency and should be followed up as soon as possible.  Feel free to call the clinic should you have any questions or concerns. The clinic phone number is [phone].  Please show the CHEMO ALERT CARD at check-in to the Emergency Department and triage nurse.    

## 2019-08-20 LAB — TSH: TSH: 0.482 u[IU]/mL (ref 0.308–3.960)

## 2019-08-31 ENCOUNTER — Encounter: Payer: Self-pay | Admitting: Radiation Therapy

## 2019-08-31 ENCOUNTER — Other Ambulatory Visit: Payer: Self-pay | Admitting: Radiation Therapy

## 2019-08-31 NOTE — Progress Notes (Signed)
Sent an e-mail to [name] to inform her of the 8/28 brain MRI and virtual follow-up with [name] we have scheduled for her.    Mont Dutton R.T.(R)(T) Radiation Special Procedures Navigator

## 2019-09-03 NOTE — Progress Notes (Signed)
Pharmacist Chemotherapy Monitoring - Follow Up Assessment    I verify that I have reviewed each item in the below checklist:  . Regimen for the patient is scheduled for the appropriate day and plan matches scheduled date. Marland Kitchen Appropriate non-routine labs are ordered dependent on drug ordered. . If applicable, additional medications reviewed and ordered per protocol based on lifetime cumulative doses and/or treatment regimen.   Plan for follow-up and/or issues identified: No . I-vent associated with next due treatment: No . MD and/or nursing notified: No  Philomena Course 09/03/2019 11:15 AM

## 2019-09-06 NOTE — Progress Notes (Signed)
Patient Care Team: Patient, [name] as PCP - General (General Practice) [name], [name], [name], [name], [name], [name], NP (Inactive) as Nurse Practitioner (Nurse Practitioner)  DIAGNOSIS:    ICD-10-CM   1. 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 Chemotherapy-induced peripheral neuropathy (HCC)  G62.0    T45.1X5A   3. 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, estrogen receptor negative ([place])  C50.511    Z17.1   5. Breast cancer of lower-outer quadrant of right female breast ([place])  C50.511 alprazolam (XANAX) 2 MG tablet    SUMMARY OF ONCOLOGIC HISTORY: Oncology History  Breast cancer of lower-outer quadrant of right female breast ([place])  12/03/2014 Mammogram   Right breast mass 1.9 cm it o'clock position 8 cm depth from the nipple   12/03/2014 Initial Diagnosis   Right breast biopsy 8:00: Invasive ductal carcinoma, grade 3, ER 0%, PR 0%, Ki-67 90%, HER-2 negative ratio 1.43   12/10/2014 Breast MRI   Right breast lower outer quadrant: 2.3 x 2.4 x 2.4 cm rim-enhancing mass abuts the pectoralis fascia but no enhancement of pectoralis muscle, second focus of artifact?'s second tissue marker clip, no lymph nodes   12/10/2014 Clinical Stage   Stage IIA: T2 N0   12/24/2014 - 04/29/2015 Neo-Adjuvant Chemotherapy   Dose dense Adriamycin and Cytoxan 4 followed by weekly Abraxane 12   05/02/2015 Breast MRI   complete radiologic response   06/16/2015 Surgery   Left Lumpectomy: Complete path Response, 0/2 LN   06/16/2015 Pathologic Stage   ypT0 ypN0   07/23/2015 - 09/05/2015 Radiation Therapy   Adjuvant RT: 50.4 Gy in 28 fractions and a boost of 10 Gy in 5  fractions to total dose of 60.4 Gy   10/24/2015 Survivorship   SCP mailed to patient in lieu of in person visit.   07/26/2016 - 07/27/2016 Radiation Therapy    SRS brain   07/27/2016 - 07/29/2016 Hospital Admission   Cerebellar mass: Right suboccipital craniotomy for tumor resection with stereotactic navigation: Metastatic poorly differentiated adenocarcinoma with extensive necrosis positive for CK 7, MOC 31, CK 5/6; Neg for Er/PR, GATA-3, GCDFP CDX2, Napsin A and TTF-1   11/17/2016 PET scan   Subcutaneous nodules in the neck, upper back, left arm, abdomen and pelvis,. 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 in BARD 1, BRCA2 and NF1   12/31/2016 Miscellaneous   Everolimus 10 mg daily for cycle 1 if she cannot tolerate will decrease to 7.5 mg daily   05/24/2017 - 07/04/2017 Chemotherapy   Xeloda 2000 mg 2 weeks on 1 week off stopped due to progression of disease based on CT scans done 06/27/2017   07/25/2017 - 03/13/2018 Chemotherapy   [place] days 1 and 8 every 3 weeks stopped for progression    03/20/2018 - 03/31/2018 Radiation Therapy   Radiation to lymph nodes   04/03/2018 -  Chemotherapy   Keytruda every 3 weeks      CHIEF COMPLIANT:  Follow-up of metastatic breast canceronKeytruda  INTERVAL HISTORY: Kaitlyn Keith is a 54 y.o. with above-mentioned history of metastatic breast cancer who is currently receiving pembrolizumab immunotherapyevery 3 weeks.She presents to the clinic todayfor treatment. She is tolerating immunotherapy extremely well.  She has hypothyroidism for which she is on thyroid replacement therapy.  Her major complaint today is left hip  and thigh pain.  It appears to be musculoskeletal in nature.  Previously we had performed MRIs which did not reveal any abnormality.  ALLERGIES:  has No Known Allergies.  MEDICATIONS:  Current Outpatient Medications  Medication Sig Dispense Refill  . alprazolam (XANAX) 2 MG tablet Take 1 tablet (2 mg total) by mouth at bedtime. 30 tablet 5  . betamethasone valerate ointment (VALISONE) 0.1 % Apply 1 application topically 2 (two) times daily. 30 g 1  . cyclobenzaprine (FLEXERIL) 5 MG tablet Take 1 tablet (5 mg total) by mouth 3 (three) times daily as needed for muscle spasms. 30 tablet 0  . gabapentin (NEURONTIN) 300 MG capsule Take 1 capsule (300 mg total) by mouth 3 (three) times daily.    Marland Kitchen levothyroxine (SYNTHROID) 125 MCG tablet Take 1 tablet (125 mcg total) by mouth daily before breakfast. 30 tablet 0  . lidocaine-prilocaine (EMLA) cream APPLY TO AFFECTED AREA ONCE AS DIRECTED 30 g 3  . lisinopril-hydrochlorothiazide (PRINZIDE,ZESTORETIC) 20-12.5 MG tablet Take 1 tablet by mouth 2 (two) times daily. 180 tablet 3   No current facility-administered medications for this visit.    PHYSICAL EXAMINATION: ECOG PERFORMANCE STATUS: 1 - Symptomatic but completely ambulatory  Vitals:   09/07/19 0823  BP: (!) 129/95  Pulse: 62  Resp: 20  Temp: 98.2 F (36.8 C)  SpO2: 100%   Filed Weights   09/07/19 0823  Weight: 182 lb 11.2 oz (82.9 kg)    LABORATORY DATA:  I have reviewed the data as listed CMP Latest Ref Rng & Units 08/17/2019 07/27/2019 07/06/2019  Glucose 70 - 99 mg/dL 97 102(H) 94  BUN 6 - 20 mg/dL '10 19 13  ' Creatinine 0.44 - 1.00 mg/dL 0.71 0.81 0.75  Sodium 135 - 145 mmol/L 141 139 140  Potassium 3.5 - 5.1 mmol/L 3.6 3.6 3.8  Chloride 98 - 111 mmol/L 106 106 108  CO2 22 - 32 mmol/L '26 26 26  ' Calcium 8.9 - 10.3 mg/dL 8.7(L) 8.9 8.7(L)  Total Protein 6.5 - 8.1 g/dL 7.3 7.3 7.0  Total Bilirubin 0.3 - 1.2 mg/dL 0.6 0.6 0.6  Alkaline Phos 38 - 126 U/L 105 106 99  AST 15 - 41  U/L '17 19 16  ' ALT 0 - 44 U/L '11 13 10    ' Lab Results  Component Value Date   WBC 3.1 (L) 09/07/2019   HGB 13.0 09/07/2019   HCT 40.0 09/07/2019   MCV 86.8 09/07/2019   PLT 167 09/07/2019   NEUTROABS 1.7 09/07/2019    ASSESSMENT & PLAN:  Breast cancer of lower-outer quadrant of right female breast ([place]) Right breast biopsy 12/03/2014 8:00: Invasive ductal carcinoma, grade 3, ER 0%, PR 0%, 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 Neoadj chemo 12/24/14- 04/29/15 AC x 4 foll by Abraxane X 12 Rt Lumpectomy: Path CR 0/2 LN Adj XRT 07/23/15- 09/08/15 PET/CT scan 11/17/2016:Subcutaneous nodules in the neck, upper back, left arm, abdomen and pelvis Patient progressed on Xeloda January 2019-07/04/2017 stopped due  to progression of disease Cerebellar mass diagnosed 07/12/2016: Resection followed by stereotactic radiation 07/29/2016 Lymph node from 03/02/2018 biopsy: PDL1+ ------------------------------------------------------------------------------------------------------------------------------------------------ Current treatment:Pembrolizumab given every 3 weeks starting 04/03/18, todayiscycle 25 Keytruda toxicities:Hypothyroidism for which she required thyroid replacement therapy Currently on 158mg of Synthroid.    Keytruda toxicities: 1.Severe scalp pain along the surgical scars very intense lasting for 48 hours improved with Aleve.Takes gabapentin, applies triamcinolone ointment 2.Right eye dryness: dexamethasone eyedrops. 3.Hypothyroidism:Synthroid1252m.   CT CAP:06/08/2019:No evidence of recurrent or metastatic disease. Mild ileus, fluid-filled appendix same as before. Left hip pain: Instructed her to do stretching exercises and yoga.  Brain MRIFebruary 2021:Stable 3.8m54module, unchanged We will plan restaging scans in 3 months.   Return to  clinic every 3 weeks for Keytruda and I will see her again in 6 weeks.    No orders of the defined types were placed in this encounter.  The patient has a good understanding of the overall plan. she agrees with it. she will call with any problems that may develop before the next visit here.  Total time spent: 30 mins including face to face time and time spent for planning, charting and coordination of care  GudNicholas LoseD 09/07/2019  I, MolCloyde Reamsrshimer, am acting as scribe for Dr. VinNicholas LoseI have reviewed the above documentation for accuracy and completeness, and I agree with the above.

## 2019-09-07 ENCOUNTER — Inpatient Hospital Stay: Payer: No Typology Code available for payment source | Attending: Hematology and Oncology

## 2019-09-07 ENCOUNTER — Other Ambulatory Visit: Payer: Self-pay

## 2019-09-07 ENCOUNTER — Inpatient Hospital Stay: Payer: No Typology Code available for payment source

## 2019-09-07 ENCOUNTER — Inpatient Hospital Stay (HOSPITAL_BASED_OUTPATIENT_CLINIC_OR_DEPARTMENT_OTHER): Payer: No Typology Code available for payment source | Admitting: Hematology and Oncology

## 2019-09-07 VITALS — BP 129/95 | HR 62 | Temp 98.2°F | Resp 20 | Ht 63.0 in | Wt 182.7 lb

## 2019-09-07 DIAGNOSIS — Z79899 Other long term (current) drug therapy: Secondary | ICD-10-CM | POA: Diagnosis not present

## 2019-09-07 DIAGNOSIS — E039 Hypothyroidism, unspecified: Secondary | ICD-10-CM | POA: Insufficient documentation

## 2019-09-07 DIAGNOSIS — C50511 Malignant neoplasm of lower-outer quadrant of right female breast: Secondary | ICD-10-CM | POA: Diagnosis present

## 2019-09-07 DIAGNOSIS — T451X5A Adverse effect of antineoplastic and immunosuppressive drugs, initial encounter: Secondary | ICD-10-CM

## 2019-09-07 DIAGNOSIS — C792 Secondary malignant neoplasm of skin: Secondary | ICD-10-CM | POA: Diagnosis not present

## 2019-09-07 DIAGNOSIS — Z923 Personal history of irradiation: Secondary | ICD-10-CM | POA: Insufficient documentation

## 2019-09-07 DIAGNOSIS — G620 Drug-induced polyneuropathy: Secondary | ICD-10-CM

## 2019-09-07 DIAGNOSIS — C7931 Secondary malignant neoplasm of brain: Secondary | ICD-10-CM

## 2019-09-07 DIAGNOSIS — Z171 Estrogen receptor negative status [ER-]: Secondary | ICD-10-CM | POA: Diagnosis not present

## 2019-09-07 DIAGNOSIS — Z1509 Genetic susceptibility to other malignant neoplasm: Secondary | ICD-10-CM | POA: Insufficient documentation

## 2019-09-07 DIAGNOSIS — Z5112 Encounter for antineoplastic immunotherapy: Secondary | ICD-10-CM | POA: Insufficient documentation

## 2019-09-07 LAB — CBC WITH DIFFERENTIAL (CANCER CENTER ONLY)
Abs Immature Granulocytes: 0.01 10*3/uL (ref 0.00–0.07)
Basophils Absolute: 0 10*3/uL (ref 0.0–0.1)
Basophils Relative: 1 %
Eosinophils Absolute: 0.1 10*3/uL (ref 0.0–0.5)
Eosinophils Relative: 2 %
HCT: 40 % (ref 36.0–46.0)
Hemoglobin: 13 g/dL (ref 12.0–15.0)
Immature Granulocytes: 0 %
Lymphocytes Relative: 32 %
Lymphs Abs: 1 10*3/uL (ref 0.7–4.0)
MCH: 28.2 pg (ref 26.0–34.0)
MCHC: 32.5 g/dL (ref 30.0–36.0)
MCV: 86.8 fL (ref 80.0–100.0)
Monocytes Absolute: 0.3 10*3/uL (ref 0.1–1.0)
Monocytes Relative: 11 %
Neutro Abs: 1.7 10*3/uL (ref 1.7–7.7)
Neutrophils Relative %: 54 %
Platelet Count: 167 10*3/uL (ref 150–400)
RBC: 4.61 MIL/uL (ref 3.87–5.11)
RDW: 13 % (ref 11.5–15.5)
WBC Count: 3.1 10*3/uL — ABNORMAL LOW (ref 4.0–10.5)
nRBC: 0 % (ref 0.0–0.2)

## 2019-09-07 LAB — TSH: TSH: 3.993 u[IU]/mL — ABNORMAL HIGH (ref 0.308–3.960)

## 2019-09-07 LAB — CMP (CANCER CENTER ONLY)
ALT: 13 U/L (ref 0–44)
AST: 20 U/L (ref 15–41)
Albumin: 3.7 g/dL (ref 3.5–5.0)
Alkaline Phosphatase: 90 U/L (ref 38–126)
Anion gap: 9 (ref 5–15)
BUN: 14 mg/dL (ref 6–20)
CO2: 24 mmol/L (ref 22–32)
Calcium: 8.5 mg/dL — ABNORMAL LOW (ref 8.9–10.3)
Chloride: 108 mmol/L (ref 98–111)
Creatinine: 0.81 mg/dL (ref 0.44–1.00)
GFR, Est AFR Am: >60 mL/min (ref >60)
GFR, Estimated: >60 mL/min (ref >60)
Glucose, Bld: 96 mg/dL (ref 70–99)
Potassium: 3.6 mmol/L (ref 3.5–5.1)
Sodium: 141 mmol/L (ref 135–145)
Total Bilirubin: 0.9 mg/dL (ref 0.3–1.2)
Total Protein: 7.3 g/dL (ref 6.5–8.1)

## 2019-09-07 MED ORDER — ALPRAZOLAM 2 MG PO TABS: 2.0000 mg | ORAL_TABLET | Freq: Every day | ORAL | 5 refills | Status: DC

## 2019-09-07 MED ORDER — HEPARIN SOD (PORK) LOCK FLUSH 100 UNIT/ML IV SOLN
500.0000 [IU] | Freq: Once | INTRAVENOUS | Status: AC | PRN
  Administered 2019-09-07: 500 [IU]
  Filled 2019-09-07: qty 5

## 2019-09-07 MED ORDER — SODIUM CHLORIDE 0.9 % IV SOLN
200.0000 mg | Freq: Once | INTRAVENOUS | Status: AC
  Administered 2019-09-07: 200 mg via INTRAVENOUS
  Filled 2019-09-07: qty 8

## 2019-09-07 MED ORDER — SODIUM CHLORIDE 0.9% FLUSH
10.0000 mL | INTRAVENOUS | Status: DC | PRN
  Administered 2019-09-07: 08:00:00 10 mL
  Filled 2019-09-07: qty 10

## 2019-09-07 MED ORDER — SODIUM CHLORIDE 0.9% FLUSH
10.0000 mL | INTRAVENOUS | Status: DC | PRN
  Administered 2019-09-07: 10 mL
  Filled 2019-09-07: qty 10

## 2019-09-07 MED ORDER — SODIUM CHLORIDE 0.9 % IV SOLN
Freq: Once | INTRAVENOUS | Status: AC
  Filled 2019-09-07: qty 250

## 2019-09-07 NOTE — Patient Instructions (Signed)
[facility] Discharge Instructions for Patients Receiving Chemotherapy  Today you received the following chemotherapy agents:  Keytruda.  To help prevent nausea and vomiting after your treatment, we encourage you to take your nausea medication as directed.   If you develop nausea and vomiting that is not controlled by your nausea medication, call the clinic.   BELOW ARE SYMPTOMS THAT SHOULD BE REPORTED IMMEDIATELY:  *FEVER GREATER THAN 100.5 F  *CHILLS WITH OR WITHOUT FEVER  NAUSEA AND VOMITING THAT IS NOT CONTROLLED WITH YOUR NAUSEA MEDICATION  *UNUSUAL SHORTNESS OF BREATH  *UNUSUAL BRUISING OR BLEEDING  TENDERNESS IN MOUTH AND THROAT WITH OR WITHOUT PRESENCE OF ULCERS  *URINARY PROBLEMS  *BOWEL PROBLEMS  UNUSUAL RASH Items with * indicate a potential emergency and should be followed up as soon as possible.  Feel free to call the clinic should you have any questions or concerns. The clinic phone number is [phone].  Please show the CHEMO ALERT CARD at check-in to the Emergency Department and triage nurse.    

## 2019-09-07 NOTE — Assessment & Plan Note (Signed)
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 Neoadj chemo 12/24/14- 04/29/15 AC x 4 foll by Abraxane X 12 Rt Lumpectomy: Path CR 0/2 LN Adj XRT 07/23/15- 09/08/15 PET/CT scan 11/17/2016:Subcutaneous nodules in the neck, upper back, left arm, abdomen and pelvis Patient progressed on Xeloda January 2019-07/04/2017 stopped due to progression of disease Cerebellar mass diagnosed 07/12/2016: Resection followed by stereotactic radiation 07/29/2016 Lymph node from 03/02/2018 biopsy: PDL1+ ------------------------------------------------------------------------------------------------------------------------------------------------ Current treatment:Pembrolizumab given every 3 weeks starting 04/03/18, todayiscycle 22 Keytruda toxicities:Hypothyroidism for which she required thyroid replacement therapy Currently on 183mg of Synthroid.    Keytruda toxicities: 1.Severe scalp pain along the surgical scars very intense lasting for 48 hours improved with Aleve.Takes gabapentin, applies triamcinolone ointment 2.Right eye dryness: dexamethasone eyedrops. 3.Hypothyroidism:Synthroid12326m.   CT CAP:06/08/2019:No evidence of recurrent or metastatic disease. Mild ileus, fluid-filled appendix same as before.  Brain MRIFebruary 2021:Stable 3.26m34module, unchanged We will plan restaging scans in 3 months.   Return to clinic every 3 weeks for Keytruda and I will see her again in 6 weeks.

## 2019-09-16 ENCOUNTER — Encounter: Payer: Self-pay | Admitting: Emergency Medicine

## 2019-09-16 ENCOUNTER — Other Ambulatory Visit: Payer: Self-pay

## 2019-09-16 ENCOUNTER — Ambulatory Visit
Admission: EM | Admit: 2019-09-16 | Discharge: 2019-09-16 | Disposition: A | Discharge status: Home / Self Care | Payer: 59 | Attending: Emergency Medicine | Admitting: Emergency Medicine

## 2019-09-16 DIAGNOSIS — G8929 Other chronic pain: Secondary | ICD-10-CM | POA: Diagnosis not present

## 2019-09-16 DIAGNOSIS — M79605 Pain in left leg: Secondary | ICD-10-CM

## 2019-09-16 MED ORDER — METHYLPREDNISOLONE SODIUM SUCC 125 MG IJ SOLR
80.0000 mg | Freq: Once | INTRAMUSCULAR | Status: AC
  Administered 2019-09-16: 80 mg via INTRAMUSCULAR

## 2019-09-16 NOTE — Discharge Instructions (Signed)
Recommend RICE: rest, ice, compression, elevation as needed for pain.    Heat therapy (hot compress, warm wash rag, hot showers, etc.) can help relax muscles and soothe muscle aches. Cold therapy (ice packs) can be used to help swelling both after injury and after prolonged use of areas of chronic pain/aches.  For pain: recommend 350 mg-1000 mg of Tylenol (acetaminophen) and/or 200 mg - 800 mg of Advil (ibuprofen, Motrin) every 8 hours as needed.  May alternate between the two throughout the day as they are generally safe to take together.  DO NOT exceed more than 3000 mg of Tylenol or 3200 mg of ibuprofen in a 24 hour period as this could damage your stomach, kidneys, liver, or increase your bleeding risk. 

## 2019-09-16 NOTE — ED Triage Notes (Addendum)
6 month history of left leg pain.  Initially had pain in left hip/groin.  Patient had mri and was told it was nothing.  In the last 2 months, pain has progressed further down left leg, foot goes numb.  Pain is lateral left leg   Patient is currently under chemotherapy

## 2019-09-16 NOTE — ED Provider Notes (Addendum)
[facility]    CSN: [id number] Arrival date & time: 09/16/19  0804      History   Chief Complaint Chief Complaint  Patient presents with  . Leg Pain    HPI Kaitlyn Keith is a 54 y.o. female with history of breast cancer, brain cancer (metastasis), hypertension presenting for chronic left leg pain.  States has been ongoing for the last 6 months: Was not preceded by specific event/injury.  Has been previously evaluated for this by her oncologist: MRI was negative.  Denies history of DVT/PE, bleeding disorder.  Has tried ibuprofen, Aleve, Tylenol without relief.   Past Medical History:  Diagnosis Date  . Anxiety   . Arthritis   . Back pain   . Brain cancer ([place])    brian met from triple negative breast ca  . Breast cancer ([place])   . Breast cancer of lower-outer quadrant of right female breast ([place]) 12/05/2014  . Depression   . FH: chemotherapy 12/2014-04/2015  . Genetic testing 12/10/2016   [name] underwent genetic counseling and testing for hereditary cancer syndromes on 11/18/2016. 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 Mutations in MSH6 are associated with a hereditary cancer syndrome called Lynch syndrome. For more detailed discussion, please see genetic counseling documentation from 12/10/2016.  Testing was perfo  . Headache    due to brain cancer, no longer having them  . Hot flashes   . Hypertension   . 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 underwent genetic counseling and testing for hereditary cancer syndromes on 11/18/2016. 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 Mutations in MSH6 are associated with a hereditary cancer syndrome called Lynch syndrome. For more detailed discussion, please see genetic counseling documentation from 12/10/2016.  Testing was perfo  . 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 Radiation 07/23/15-09/05/15   right breast 50.4 Gy, boost of 10 Gy    Patient Active Problem List   Diagnosis Date Noted  . Metastasis to skin ([place]) 07/05/2017  . Goals of care, counseling/discussion 07/04/2017  . Genetic testing 12/10/2016  . 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 Cerebellar mass 07/27/2016  . Cerebellar tumor ([place]) 07/27/2016  . 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) 07/12/2016  . C2 cervical fracture ([place]) 07/12/2016  . Hypertension 07/12/2016  . Degenerative spinal arthritis 11/18/2015  . Chemotherapy-induced peripheral neuropathy ([place]) 06/23/2015  . Paronychia of great toe, left 02/04/2015  . Breast cancer of lower-outer quadrant of right female breast ([place]) 12/05/2014    Past Surgical History:  Procedure Laterality Date  . APPLICATION OF CRANIAL NAVIGATION Right 07/27/2016   Procedure: APPLICATION OF CRANIAL NAVIGATION;  Surgeon: Kevan Ny Ditty, MD;  Location: [place];  Service: Neurosurgery;  Laterality: Right;  . BIOPSY OF SKIN SUBCUTANEOUS TISSUE AND/OR MUCOUS MEMBRANE Left 12/24/2016   Procedure: OPEN BIOPSY LESIONS ON LEFT LOWER BACK AND SHOULDER BLADE;  Surgeon: [name], MD;  Location: [place];  Service: General;  Laterality: Left;  . BREAST LUMPECTOMY WITH NEEDLE LOCALIZATION AND AXILLARY SENTINEL LYMPH NODE BX Right 06/16/2015   Procedure: BREAST LUMPECTOMY WITH NEEDLE LOCALIZATION AND AXILLARY SENTINEL LYMPH NODE BX;  Surgeon: [name], MD;  Location: [place];  Service: General;  Laterality: Right;  . BREAST REDUCTION SURGERY    . CRANIOTOMY Right 07/27/2016   Procedure: Right Suboccipital craniotomy for tumor resection with  stereotactic navigation;  Surgeon: Kevan Ny Ditty, MD;  Location: [place];  Service: Neurosurgery;  Laterality: Right;  . PORT-A-CATH REMOVAL N/A 06/16/2015   Procedure: REMOVAL PORT-A-CATH;  Surgeon: [name], MD;  Location: [place];  Service: General;  Laterality: N/A;  . PORTACATH PLACEMENT N/A 12/23/2014   Procedure: INSERTION PORT-A-CATH;  Surgeon: [name], MD;  Location: [place];  Service: General;  Laterality: N/A;  . PORTACATH PLACEMENT Left 07/08/2017   Procedure: INSERTION PORT-A-CATH;  Surgeon: [name], MD;  Location: [place];  Service: General;  Laterality: Left;    OB History   No obstetric history on file.      Home Medications    Prior to Admission medications   Medication Sig Start Date End Date Taking? Authorizing Provider  levothyroxine (SYNTHROID) 125 MCG tablet Take 1 tablet (125 mcg total) by mouth daily before breakfast. 06/15/19  Yes [name], MD  lisinopril-hydrochlorothiazide (PRINZIDE,ZESTORETIC) 20-12.5 MG tablet Take 1 tablet by mouth 2 (two) times daily. 06/22/18  Yes [name], MD  alprazolam Duanne Moron) 2 MG tablet Take 1 tablet (2 mg total) by mouth at bedtime. 09/07/19   [name], MD  betamethasone valerate ointment (VALISONE) 0.1 % Apply 1 application topically 2 (two) times daily. 06/22/18   [name], MD  cyclobenzaprine (FLEXERIL) 5 MG tablet Take 1 tablet (5 mg total) by mouth 3 (three) times daily as needed for muscle spasms. 06/15/19   [name], MD  lidocaine-prilocaine (EMLA) cream APPLY TO AFFECTED AREA ONCE AS DIRECTED 06/22/18   [name], MD  gabapentin (NEURONTIN) 300 MG capsule Take 1 capsule (300 mg total) by mouth 3 (three) times daily. 06/15/19 09/16/19  [name], MD    Family History Family History  Problem Relation Age of Onset  . Aneurysm Mother 25       d.55  . Heart attack Father 29       d.62  . Endometrial cancer Sister 44  . Lung cancer Maternal Uncle        d.68s  . Cancer Maternal Grandmother        unspecified type-possibly stomach d.88s    Social History Social History   Tobacco Use  . Smoking status: Never Smoker  . Smokeless tobacco: Never Used  Substance  Use Topics  . Alcohol use: Yes    Comment: social  . Drug use: No     Allergies   Patient has no known allergies.   Review of Systems As per HPI   Physical Exam Triage Vital Signs ED Triage Vitals  Enc Vitals Group     BP      Pulse      Resp      Temp      Temp src      SpO2      Weight      Height      Head Circumference      Peak Flow      Pain Score      Pain Loc      Pain Edu?      Excl. in [place]?    No data found.  Updated Vital Signs BP 119/82 (BP Location: Left Arm)   Pulse 91   Temp 98.1 F (36.7 C) (Oral)   Resp 18   Wt 177 lb (80.3 kg)   SpO2 99%   BMI 31.35 kg/m   Visual Acuity Right Eye Distance:   Left  Eye Distance:   Bilateral Distance:    Right Eye Near:   Left Eye Near:    Bilateral Near:     Physical Exam Constitutional:      General: She is not in acute distress. HENT:     Head: Normocephalic and atraumatic.  Eyes:     General: No scleral icterus.    Pupils: Pupils are equal, round, and reactive to light.  Cardiovascular:     Rate and Rhythm: Normal rate.     Pulses: Normal pulses.  Pulmonary:     Effort: Pulmonary effort is normal.  Musculoskeletal:        General: No swelling, tenderness or deformity. Normal range of motion.     Right lower leg: No edema.     Left lower leg: Edema present.  Skin:    Coloration: Skin is not jaundiced or pale.  Neurological:     Mental Status: She is alert and oriented to person, place, and time.     Sensory: No sensory deficit.     Gait: Gait normal.     Deep Tendon Reflexes: Reflexes normal.      UC Treatments / Results  Labs (all labs ordered are listed, but only abnormal results are displayed) Labs Reviewed - No data to display  EKG   Radiology No results found.  Procedures Procedures (including critical care time)  Medications Ordered in UC Medications  methylPREDNISolone sodium succinate (SOLU-MEDROL) 125 mg/2 mL injection 80 mg (80 mg Intramuscular Given 09/16/19  0850)    Initial Impression / Assessment and Plan / UC Course  I have reviewed the triage vital signs and the nursing notes.  Pertinent labs & imaging results that were available during my care of the patient were reviewed by me and considered in my medical decision making (see chart for details).     Patient afebrile, nontoxic in office today.  Exam unremarkable.  Patient requesting cortisone shot.  Stated we do not have this in the office, though did have Solu-Medrol.  Could also try Toradol.  Patient states NSAIDs have not worked for her, requesting steroid.  Next oncology appointment is in 2 weeks.  Willing to trial Solu-Medrol, will observe closely for bruising, bleeding, fever, and keep follow-up.  Return precautions discussed, patient verbalized understanding and is agreeable to plan. Final Clinical Impressions(s) / UC Diagnoses   Final diagnoses:  Chronic pain of left lower extremity     Discharge Instructions     Recommend RICE: rest, ice, compression, elevation as needed for pain.   Heat therapy (hot compress, warm wash rag, hot showers, etc.) can help relax muscles and soothe muscle aches. Cold therapy (ice packs) can be used to help swelling both after injury and after prolonged use of areas of chronic pain/aches.  For pain: recommend 350 mg-1000 mg of Tylenol (acetaminophen) and/or 200 mg - 800 mg of Advil (ibuprofen, Motrin) every 8 hours as needed.  May alternate between the two throughout the day as they are generally safe to take together.  DO NOT exceed more than 3000 mg of Tylenol or 3200 mg of ibuprofen in a 24 hour period as this could damage your stomach, kidneys, liver, or increase your bleeding risk.    ED Prescriptions    None     I have reviewed the PDMP during this encounter.   Hall-Potvin, [place], PA-C 09/16/19 0848    Hall-Potvin, [place], [place] 09/16/19 1649

## 2019-09-20 ENCOUNTER — Other Ambulatory Visit: Payer: Self-pay | Admitting: Hematology and Oncology

## 2019-09-20 DIAGNOSIS — C50511 Malignant neoplasm of lower-outer quadrant of right female breast: Secondary | ICD-10-CM

## 2019-09-24 NOTE — Progress Notes (Signed)
Pharmacist Chemotherapy Monitoring - Follow Up Assessment    I verify that I have reviewed each item in the below checklist:  . Regimen for the patient is scheduled for the appropriate day and plan matches scheduled date. Marland Kitchen Appropriate non-routine labs are ordered dependent on drug ordered. . If applicable, additional medications reviewed and ordered per protocol based on lifetime cumulative doses and/or treatment regimen.   Plan for follow-up and/or issues identified: No . I-vent associated with next due treatment: No . MD and/or nursing notified: No   Kennith Center, Pharm.D., CPP 09/24/2019@12 :09 PM

## 2019-09-28 ENCOUNTER — Inpatient Hospital Stay: Payer: No Typology Code available for payment source

## 2019-09-28 ENCOUNTER — Inpatient Hospital Stay: Payer: No Typology Code available for payment source | Attending: Hematology and Oncology

## 2019-09-28 ENCOUNTER — Other Ambulatory Visit: Payer: Self-pay

## 2019-09-28 VITALS — BP 123/85 | HR 76 | Temp 98.3°F | Resp 18

## 2019-09-28 DIAGNOSIS — C7931 Secondary malignant neoplasm of brain: Secondary | ICD-10-CM | POA: Insufficient documentation

## 2019-09-28 DIAGNOSIS — Z923 Personal history of irradiation: Secondary | ICD-10-CM | POA: Diagnosis not present

## 2019-09-28 DIAGNOSIS — Z79899 Other long term (current) drug therapy: Secondary | ICD-10-CM | POA: Diagnosis not present

## 2019-09-28 DIAGNOSIS — Z9221 Personal history of antineoplastic chemotherapy: Secondary | ICD-10-CM | POA: Insufficient documentation

## 2019-09-28 DIAGNOSIS — Z5112 Encounter for antineoplastic immunotherapy: Secondary | ICD-10-CM | POA: Insufficient documentation

## 2019-09-28 DIAGNOSIS — C50511 Malignant neoplasm of lower-outer quadrant of right female breast: Secondary | ICD-10-CM | POA: Diagnosis present

## 2019-09-28 DIAGNOSIS — Z171 Estrogen receptor negative status [ER-]: Secondary | ICD-10-CM

## 2019-09-28 DIAGNOSIS — E039 Hypothyroidism, unspecified: Secondary | ICD-10-CM | POA: Insufficient documentation

## 2019-09-28 DIAGNOSIS — C792 Secondary malignant neoplasm of skin: Secondary | ICD-10-CM

## 2019-09-28 LAB — CBC WITH DIFFERENTIAL (CANCER CENTER ONLY)
Abs Immature Granulocytes: 0.01 10*3/uL (ref 0.00–0.07)
Basophils Absolute: 0 10*3/uL (ref 0.0–0.1)
Basophils Relative: 0 %
Eosinophils Absolute: 0.1 10*3/uL (ref 0.0–0.5)
Eosinophils Relative: 1 %
HCT: 40.6 % (ref 36.0–46.0)
Hemoglobin: 13.4 g/dL (ref 12.0–15.0)
Immature Granulocytes: 0 %
Lymphocytes Relative: 28 %
Lymphs Abs: 1 10*3/uL (ref 0.7–4.0)
MCH: 28.8 pg (ref 26.0–34.0)
MCHC: 33 g/dL (ref 30.0–36.0)
MCV: 87.3 fL (ref 80.0–100.0)
Monocytes Absolute: 0.3 10*3/uL (ref 0.1–1.0)
Monocytes Relative: 9 %
Neutro Abs: 2.2 10*3/uL (ref 1.7–7.7)
Neutrophils Relative %: 62 %
Platelet Count: 160 10*3/uL (ref 150–400)
RBC: 4.65 MIL/uL (ref 3.87–5.11)
RDW: 12.8 % (ref 11.5–15.5)
WBC Count: 3.7 10*3/uL — ABNORMAL LOW (ref 4.0–10.5)
nRBC: 0 % (ref 0.0–0.2)

## 2019-09-28 LAB — CMP (CANCER CENTER ONLY)
ALT: 14 U/L (ref 0–44)
AST: 18 U/L (ref 15–41)
Albumin: 3.8 g/dL (ref 3.5–5.0)
Alkaline Phosphatase: 98 U/L (ref 38–126)
Anion gap: 8 (ref 5–15)
BUN: 7 mg/dL (ref 6–20)
CO2: 25 mmol/L (ref 22–32)
Calcium: 8.9 mg/dL (ref 8.9–10.3)
Chloride: 104 mmol/L (ref 98–111)
Creatinine: 0.76 mg/dL (ref 0.44–1.00)
GFR, Est AFR Am: >60 mL/min (ref >60)
GFR, Estimated: >60 mL/min (ref >60)
Glucose, Bld: 132 mg/dL — ABNORMAL HIGH (ref 70–99)
Potassium: 3.4 mmol/L — ABNORMAL LOW (ref 3.5–5.1)
Sodium: 137 mmol/L (ref 135–145)
Total Bilirubin: 0.7 mg/dL (ref 0.3–1.2)
Total Protein: 7.2 g/dL (ref 6.5–8.1)

## 2019-09-28 MED ORDER — HEPARIN SOD (PORK) LOCK FLUSH 100 UNIT/ML IV SOLN
500.0000 [IU] | Freq: Once | INTRAVENOUS | Status: AC | PRN
  Administered 2019-09-28: 16:00:00 500 [IU]
  Filled 2019-09-28: qty 5

## 2019-09-28 MED ORDER — SODIUM CHLORIDE 0.9% FLUSH
10.0000 mL | INTRAVENOUS | Status: DC | PRN
  Filled 2019-09-28: qty 10

## 2019-09-28 MED ORDER — SODIUM CHLORIDE 0.9 % IV SOLN
Freq: Once | INTRAVENOUS | Status: AC
  Filled 2019-09-28: qty 250

## 2019-09-28 MED ORDER — SODIUM CHLORIDE 0.9 % IV SOLN
200.0000 mg | Freq: Once | INTRAVENOUS | Status: AC
  Administered 2019-09-28: 15:00:00 200 mg via INTRAVENOUS
  Filled 2019-09-28: qty 8

## 2019-09-28 MED ORDER — SODIUM CHLORIDE 0.9% FLUSH
10.0000 mL | INTRAVENOUS | Status: DC | PRN
  Administered 2019-09-28 (×2): 10 mL
  Filled 2019-09-28: qty 10

## 2019-09-28 NOTE — Patient Instructions (Signed)
[facility] Discharge Instructions for Patients Receiving Chemotherapy  Today you received the following chemotherapy agents:  Keytruda.  To help prevent nausea and vomiting after your treatment, we encourage you to take your nausea medication as directed.   If you develop nausea and vomiting that is not controlled by your nausea medication, call the clinic.   BELOW ARE SYMPTOMS THAT SHOULD BE REPORTED IMMEDIATELY:  *FEVER GREATER THAN 100.5 F  *CHILLS WITH OR WITHOUT FEVER  NAUSEA AND VOMITING THAT IS NOT CONTROLLED WITH YOUR NAUSEA MEDICATION  *UNUSUAL SHORTNESS OF BREATH  *UNUSUAL BRUISING OR BLEEDING  TENDERNESS IN MOUTH AND THROAT WITH OR WITHOUT PRESENCE OF ULCERS  *URINARY PROBLEMS  *BOWEL PROBLEMS  UNUSUAL RASH Items with * indicate a potential emergency and should be followed up as soon as possible.  Feel free to call the clinic should you have any questions or concerns. The clinic phone number is [phone].  Please show the CHEMO ALERT CARD at check-in to the Emergency Department and triage nurse.    

## 2019-10-01 LAB — TSH: TSH: 0.65 u[IU]/mL (ref 0.308–3.960)

## 2019-10-16 NOTE — Progress Notes (Signed)
Patient Care Team: Patient, [name] as PCP - General (General Practice) [name], [name], [name], [name], [name], [name], NP (Inactive) as Nurse Practitioner (Nurse Practitioner)  DIAGNOSIS:    ICD-10-CM   1. 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, estrogen receptor negative ([place])  C50.511    Z17.1     SUMMARY OF ONCOLOGIC HISTORY: Oncology History  Breast cancer of lower-outer quadrant of right female breast ([place])  12/03/2014 Mammogram   Right breast mass 1.9 cm it o'clock position 8 cm depth from the nipple   12/03/2014 Initial Diagnosis   Right breast biopsy 8:00: Invasive ductal carcinoma, grade 3, ER 0%, PR 0%, Ki-67 90%, HER-2 negative ratio 1.43   12/10/2014 Breast MRI   Right breast lower outer quadrant: 2.3 x 2.4 x 2.4 cm rim-enhancing mass abuts the pectoralis fascia but no enhancement of pectoralis muscle, second focus of artifact?'s second tissue marker clip, no lymph nodes   12/10/2014 Clinical Stage   Stage IIA: T2 N0   12/24/2014 - 04/29/2015 Neo-Adjuvant Chemotherapy   Dose dense Adriamycin and Cytoxan 4 followed by weekly Abraxane 12   05/02/2015 Breast MRI   complete radiologic response   06/16/2015 Surgery   Left Lumpectomy: Complete path Response, 0/2 LN   06/16/2015 Pathologic Stage   ypT0 ypN0   07/23/2015 - 09/05/2015 Radiation Therapy   Adjuvant RT: 50.4 Gy in 28 fractions and a boost of 10 Gy in 5 fractions to total dose of 60.4 Gy   10/24/2015 Survivorship   SCP mailed to patient in lieu of in person visit.   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 in BARD 1, BRCA2 and NF1   12/31/2016 Miscellaneous   Everolimus 10 mg daily for cycle 1 if she cannot tolerate will decrease to 7.5 mg daily   05/24/2017 - 07/04/2017 Chemotherapy   Xeloda 2000 mg 2 weeks on 1 week off stopped due to progression of disease based on CT scans done 06/27/2017   07/25/2017 - 03/13/2018 Chemotherapy   [place]aven days 1 and 8 every 3 weeks stopped for progression    03/20/2018 - 03/31/2018 Radiation Therapy   Radiation to lymph nodes   04/03/2018 -  Chemotherapy   Keytruda every 3 weeks      CHIEF COMPLIANT: Follow-up of metastatic breast canceronKeytruda  INTERVAL HISTORY: Kaitlyn Keith is a 54 y.o. with above-mentioned history of metastatic breast cancer who is currently receiving pembrolizumab immunotherapyevery 3 weeks.She presents to the clinic todayfor treatment.   ALLERGIES:  has No Known Allergies.  MEDICATIONS:  Current Outpatient Medications  Medication Sig  Dispense Refill  . alprazolam (XANAX) 2 MG tablet Take 1 tablet (2 mg total) by mouth at bedtime. 30 tablet 5  . betamethasone valerate ointment (VALISONE) 0.1 % Apply 1 application topically 2 (two) times daily. 30 g 1  . cyclobenzaprine (FLEXERIL) 5 MG tablet Take 1 tablet (5 mg total) by mouth 3 (three) times daily as needed for muscle  spasms. 30 tablet 0  . levothyroxine (SYNTHROID) 125 MCG tablet TAKE 1 TABLET BY MOUTH EVERY DAY BEFORE BREAKFAST 90 tablet 0  . lidocaine-prilocaine (EMLA) cream APPLY TO AFFECTED AREA ONCE AS DIRECTED 30 g 3  . lisinopril-hydrochlorothiazide (ZESTORETIC) 20-12.5 MG tablet TAKE 1 TABLET BY MOUTH TWICE A DAY 180 tablet 3  . triamcinolone ointment (KENALOG) 0.5 % APPLY TO AFFECTED AREA TWICE A DAY 30 g 0   No current facility-administered medications for this visit.    PHYSICAL EXAMINATION: ECOG PERFORMANCE STATUS: 1 - Symptomatic but completely ambulatory  Vitals:   10/17/19 1427  BP: (!) 127/95  Pulse: 67  Resp: 18  Temp: 98.5 F (36.9 C)  SpO2: 98%   Filed Weights   10/17/19 1427  Weight: 175 lb 9.6 oz (79.7 kg)    LABORATORY DATA:  I have reviewed the data as listed CMP Latest Ref Rng & Units 09/28/2019 09/07/2019 08/17/2019  Glucose 70 - 99 mg/dL 132(H) 96 97  BUN 6 - 20 mg/dL _0 Creatinine 0.44 - 1.00 mg/dL 0.76 0.81 0.71  Sodium 135 - 145 mmol/L 137 141 141  Potassium 3.5 - 5.1 mmol/L 3.4(L) 3.6 3.6  Chloride 98 - 111 mmol/L 104 108 106  CO2 22 - 32 mmol/L _1 Calcium 8.9 - 10.3 mg/dL 8.9 8.5(L) 8.7(L)  Total Protein 6.5 - 8.1 g/dL 7.2 7.3 7.3  Total Bilirubin 0.3 - 1.2 mg/dL 0.7 0.9 0.6  Alkaline Phos 38 - 126 U/L 98 90 105  AST 15 - 41 U/L _2 ALT 0 - 44 U/L _3 Lab Results  Component Value Date   WBC 5.0 10/17/2019   HGB 13.7 10/17/2019   HCT 41.7 10/17/2019   MCV 85.5 10/17/2019   PLT 173 10/17/2019   NEUTROABS 2.8 10/17/2019    ASSESSMENT & PLAN:  Breast cancer of lower-outer quadrant of right female breast ([place]) Right breast biopsy 12/03/2014 8:00: Invasive ductal carcinoma, grade 3, ER 0%, PR 0%, 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 Neoadj chemo 12/24/14- 04/29/15 AC x 4 foll by Abraxane X 12 Rt  Lumpectomy: Path CR 0/2 LN Adj XRT 07/23/15- 09/08/15 PET/CT scan 11/17/2016:Subcutaneous nodules in the neck, upper back, left arm, abdomen and pelvis Patient progressed on Xeloda January 2019-07/04/2017 stopped due to progression of disease Cerebellar mass diagnosed 07/12/2016: Resection followed by stereotactic radiation 07/29/2016 Lymph node from 03/02/2018 biopsy: PDL1+ ------------------------------------------------------------------------------------------------------------------------------------------------ Current treatment:Pembrolizumab given every 3 weeks starting 04/03/18, todayiscycle 24  Keytruda toxicities: 1.Severe scalp pain along the surgical scars very intense lasting for 48 hours improved with Aleve.Takes gabapentin, applies triamcinolone ointment 2.Right eye dryness: dexamethasone eyedrops. 3.Hypothyroidism:Synthroid175mg.   CT CAP:06/08/2019:No evidence of recurrent or metastatic disease. Mild ileus, fluid-filled appendix same as before. Left hip pain: Instructed her to do stretching exercises and yoga.  Brain MRIFebruary 2021:Stable 3.762mnodule, unchanged We will plan restaging scans in July Return to clinic every 3 weeks for  Keytruda and I will see her again in 6 weeks.   No orders of the defined types were placed in this encounter.  The patient has a good understanding of the overall plan. she agrees with it. she will call with any problems that may develop before the next visit here.  Total time spent: 30 mins including face to face time and time spent for planning, charting and coordination of care  Gudena, Vinay, MD 10/17/2019  I, Molly Dorshimer, am acting as scribe for Dr. Vinay Gudena.  I have reviewed the above documentation for accuracy and completeness, and I agree with the above.       

## 2019-10-17 ENCOUNTER — Inpatient Hospital Stay: Payer: No Typology Code available for payment source

## 2019-10-17 ENCOUNTER — Inpatient Hospital Stay (HOSPITAL_BASED_OUTPATIENT_CLINIC_OR_DEPARTMENT_OTHER): Payer: No Typology Code available for payment source | Admitting: Hematology and Oncology

## 2019-10-17 ENCOUNTER — Other Ambulatory Visit: Payer: Self-pay

## 2019-10-17 DIAGNOSIS — Z171 Estrogen receptor negative status [ER-]: Secondary | ICD-10-CM

## 2019-10-17 DIAGNOSIS — C50511 Malignant neoplasm of lower-outer quadrant of right female breast: Secondary | ICD-10-CM

## 2019-10-17 DIAGNOSIS — Z5112 Encounter for antineoplastic immunotherapy: Secondary | ICD-10-CM | POA: Diagnosis not present

## 2019-10-17 LAB — CBC WITH DIFFERENTIAL (CANCER CENTER ONLY)
Abs Immature Granulocytes: 0.01 10*3/uL (ref 0.00–0.07)
Basophils Absolute: 0 10*3/uL (ref 0.0–0.1)
Basophils Relative: 1 %
Eosinophils Absolute: 0.1 10*3/uL (ref 0.0–0.5)
Eosinophils Relative: 1 %
HCT: 41.7 % (ref 36.0–46.0)
Hemoglobin: 13.7 g/dL (ref 12.0–15.0)
Immature Granulocytes: 0 %
Lymphocytes Relative: 32 %
Lymphs Abs: 1.6 10*3/uL (ref 0.7–4.0)
MCH: 28.1 pg (ref 26.0–34.0)
MCHC: 32.9 g/dL (ref 30.0–36.0)
MCV: 85.5 fL (ref 80.0–100.0)
Monocytes Absolute: 0.5 10*3/uL (ref 0.1–1.0)
Monocytes Relative: 10 %
Neutro Abs: 2.8 10*3/uL (ref 1.7–7.7)
Neutrophils Relative %: 56 %
Platelet Count: 173 10*3/uL (ref 150–400)
RBC: 4.88 MIL/uL (ref 3.87–5.11)
RDW: 12.5 % (ref 11.5–15.5)
WBC Count: 5 10*3/uL (ref 4.0–10.5)
nRBC: 0 % (ref 0.0–0.2)

## 2019-10-17 LAB — CMP (CANCER CENTER ONLY)
ALT: 11 U/L (ref 0–44)
AST: 20 U/L (ref 15–41)
Albumin: 3.9 g/dL (ref 3.5–5.0)
Alkaline Phosphatase: 102 U/L (ref 38–126)
Anion gap: 8 (ref 5–15)
BUN: 11 mg/dL (ref 6–20)
CO2: 27 mmol/L (ref 22–32)
Calcium: 9.3 mg/dL (ref 8.9–10.3)
Chloride: 104 mmol/L (ref 98–111)
Creatinine: 0.76 mg/dL (ref 0.44–1.00)
GFR, Est AFR Am: >60 mL/min (ref >60)
GFR, Estimated: >60 mL/min (ref >60)
Glucose, Bld: 96 mg/dL (ref 70–99)
Potassium: 3.7 mmol/L (ref 3.5–5.1)
Sodium: 139 mmol/L (ref 135–145)
Total Bilirubin: 0.6 mg/dL (ref 0.3–1.2)
Total Protein: 7.6 g/dL (ref 6.5–8.1)

## 2019-10-17 LAB — TSH: TSH: 0.221 u[IU]/mL — ABNORMAL LOW (ref 0.308–3.960)

## 2019-10-17 MED ORDER — HEPARIN SOD (PORK) LOCK FLUSH 100 UNIT/ML IV SOLN
500.0000 [IU] | Freq: Once | INTRAVENOUS | Status: AC | PRN
  Administered 2019-10-17: 500 [IU]
  Filled 2019-10-17: qty 5

## 2019-10-17 MED ORDER — SODIUM CHLORIDE 0.9% FLUSH
10.0000 mL | INTRAVENOUS | Status: DC | PRN
  Administered 2019-10-17: 10 mL
  Filled 2019-10-17: qty 10

## 2019-10-17 MED ORDER — SODIUM CHLORIDE 0.9 % IV SOLN
200.0000 mg | Freq: Once | INTRAVENOUS | Status: AC
  Administered 2019-10-17: 200 mg via INTRAVENOUS
  Filled 2019-10-17: qty 8

## 2019-10-17 MED ORDER — ALTEPLASE 2 MG IJ SOLR
INTRAMUSCULAR | Status: AC
  Filled 2019-10-17: qty 2

## 2019-10-17 MED ORDER — ALTEPLASE 2 MG IJ SOLR
2.0000 mg | Freq: Once | INTRAMUSCULAR | Status: AC | PRN
  Administered 2019-10-17: 2 mg
  Filled 2019-10-17: qty 2

## 2019-10-17 MED ORDER — SODIUM CHLORIDE 0.9 % IV SOLN
Freq: Once | INTRAVENOUS | Status: DC
  Filled 2019-10-17: qty 250

## 2019-10-17 NOTE — Assessment & Plan Note (Signed)
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 Neoadj chemo 12/24/14- 04/29/15 AC x 4 foll by Abraxane X 12 Rt Lumpectomy: Path CR 0/2 LN Adj XRT 07/23/15- 09/08/15 PET/CT scan 11/17/2016:Subcutaneous nodules in the neck, upper back, left arm, abdomen and pelvis Patient progressed on Xeloda January 2019-07/04/2017 stopped due to progression of disease Cerebellar mass diagnosed 07/12/2016: Resection followed by stereotactic radiation 07/29/2016 Lymph node from 03/02/2018 biopsy: PDL1+ ------------------------------------------------------------------------------------------------------------------------------------------------ Current treatment:Pembrolizumab given every 3 weeks starting 04/03/18, todayiscycle 24  Keytruda toxicities: 1.Severe scalp pain along the surgical scars very intense lasting for 48 hours improved with Aleve.Takes gabapentin, applies triamcinolone ointment 2.Right eye dryness: dexamethasone eyedrops. 3.Hypothyroidism:Synthroid144mg.   CT CAP:06/08/2019:No evidence of recurrent or metastatic disease. Mild ileus, fluid-filled appendix same as before. Left hip pain: Instructed her to do stretching exercises and yoga.  Brain MRIFebruary 2021:Stable 3.784mnodule, unchanged We will plan restaging scans in July [facility] will see her again in 6 weeks.

## 2019-10-17 NOTE — Patient Instructions (Signed)
[facility] Discharge Instructions for Patients Receiving Chemotherapy  Today you received the following chemotherapy agents:  Keytruda.  To help prevent nausea and vomiting after your treatment, we encourage you to take your nausea medication as directed.   If you develop nausea and vomiting that is not controlled by your nausea medication, call the clinic.   BELOW ARE SYMPTOMS THAT SHOULD BE REPORTED IMMEDIATELY:  *FEVER GREATER THAN 100.5 F  *CHILLS WITH OR WITHOUT FEVER  NAUSEA AND VOMITING THAT IS NOT CONTROLLED WITH YOUR NAUSEA MEDICATION  *UNUSUAL SHORTNESS OF BREATH  *UNUSUAL BRUISING OR BLEEDING  TENDERNESS IN MOUTH AND THROAT WITH OR WITHOUT PRESENCE OF ULCERS  *URINARY PROBLEMS  *BOWEL PROBLEMS  UNUSUAL RASH Items with * indicate a potential emergency and should be followed up as soon as possible.  Feel free to call the clinic should you have any questions or concerns. The clinic phone number is [phone].  Please show the CHEMO ALERT CARD at check-in to the Emergency Department and triage nurse.    

## 2019-10-18 ENCOUNTER — Ambulatory Visit: Payer: 59

## 2019-10-18 ENCOUNTER — Other Ambulatory Visit: Payer: 59

## 2019-10-18 ENCOUNTER — Ambulatory Visit: Payer: 59 | Admitting: Hematology and Oncology

## 2019-10-30 ENCOUNTER — Encounter: Payer: Self-pay | Admitting: Hematology and Oncology

## 2019-11-07 ENCOUNTER — Encounter: Payer: Self-pay | Admitting: *Deleted

## 2019-11-09 ENCOUNTER — Inpatient Hospital Stay: Payer: No Typology Code available for payment source

## 2019-11-15 NOTE — Telephone Encounter (Signed)
Reached patient regarding Kaitlyn Keith form.  [name] provided Building control surveyor name, direct phone number for assistance with form.  Expressed displeasure that claim denied.  "Form should be completed the exact way it was the last time.  I do not know when I was unable to work due to disability, check your records and previous forms and have Dr. Lindi Adie submit a new form." Received return call from [name] with [employer].  DIscussed items needing corrections for numbers 10 and 11.  [name] provided his personal fax number [phone]).

## 2019-11-16 ENCOUNTER — Other Ambulatory Visit: Payer: Self-pay

## 2019-11-16 ENCOUNTER — Inpatient Hospital Stay: Payer: No Typology Code available for payment source

## 2019-11-16 ENCOUNTER — Inpatient Hospital Stay: Payer: No Typology Code available for payment source | Attending: Hematology and Oncology

## 2019-11-16 VITALS — BP 110/91 | HR 59 | Temp 97.6°F | Resp 18

## 2019-11-16 DIAGNOSIS — Z5112 Encounter for antineoplastic immunotherapy: Secondary | ICD-10-CM | POA: Diagnosis not present

## 2019-11-16 DIAGNOSIS — Z171 Estrogen receptor negative status [ER-]: Secondary | ICD-10-CM | POA: Diagnosis not present

## 2019-11-16 DIAGNOSIS — C792 Secondary malignant neoplasm of skin: Secondary | ICD-10-CM

## 2019-11-16 DIAGNOSIS — C50511 Malignant neoplasm of lower-outer quadrant of right female breast: Secondary | ICD-10-CM

## 2019-11-16 LAB — CMP (CANCER CENTER ONLY)
ALT: 13 U/L (ref 0–44)
AST: 16 U/L (ref 15–41)
Albumin: 3.5 g/dL (ref 3.5–5.0)
Alkaline Phosphatase: 100 U/L (ref 38–126)
Anion gap: 9 (ref 5–15)
BUN: 12 mg/dL (ref 6–20)
CO2: 23 mmol/L (ref 22–32)
Calcium: 8.7 mg/dL — ABNORMAL LOW (ref 8.9–10.3)
Chloride: 108 mmol/L (ref 98–111)
Creatinine: 0.75 mg/dL (ref 0.44–1.00)
GFR, Est AFR Am: >60 mL/min (ref >60)
GFR, Estimated: >60 mL/min (ref >60)
Glucose, Bld: 93 mg/dL (ref 70–99)
Potassium: 3.5 mmol/L (ref 3.5–5.1)
Sodium: 140 mmol/L (ref 135–145)
Total Bilirubin: 0.5 mg/dL (ref 0.3–1.2)
Total Protein: 6.8 g/dL (ref 6.5–8.1)

## 2019-11-16 LAB — CBC WITH DIFFERENTIAL (CANCER CENTER ONLY)
Abs Immature Granulocytes: 0.01 10*3/uL (ref 0.00–0.07)
Basophils Absolute: 0 10*3/uL (ref 0.0–0.1)
Basophils Relative: 1 %
Eosinophils Absolute: 0.1 10*3/uL (ref 0.0–0.5)
Eosinophils Relative: 3 %
HCT: 38.8 % (ref 36.0–46.0)
Hemoglobin: 12.8 g/dL (ref 12.0–15.0)
Immature Granulocytes: 0 %
Lymphocytes Relative: 28 %
Lymphs Abs: 1.2 10*3/uL (ref 0.7–4.0)
MCH: 29 pg (ref 26.0–34.0)
MCHC: 33 g/dL (ref 30.0–36.0)
MCV: 88 fL (ref 80.0–100.0)
Monocytes Absolute: 0.4 10*3/uL (ref 0.1–1.0)
Monocytes Relative: 9 %
Neutro Abs: 2.5 10*3/uL (ref 1.7–7.7)
Neutrophils Relative %: 59 %
Platelet Count: 180 10*3/uL (ref 150–400)
RBC: 4.41 MIL/uL (ref 3.87–5.11)
RDW: 12.7 % (ref 11.5–15.5)
WBC Count: 4.2 10*3/uL (ref 4.0–10.5)
nRBC: 0 % (ref 0.0–0.2)

## 2019-11-16 MED ORDER — SODIUM CHLORIDE 0.9 % IV SOLN
200.0000 mg | Freq: Once | INTRAVENOUS | Status: AC
  Administered 2019-11-16: 200 mg via INTRAVENOUS
  Filled 2019-11-16: qty 8

## 2019-11-16 MED ORDER — SODIUM CHLORIDE 0.9 % IV SOLN
Freq: Once | INTRAVENOUS | Status: AC
  Filled 2019-11-16: qty 250

## 2019-11-16 MED ORDER — HEPARIN SOD (PORK) LOCK FLUSH 100 UNIT/ML IV SOLN
500.0000 [IU] | Freq: Once | INTRAVENOUS | Status: AC | PRN
  Administered 2019-11-16: 500 [IU]
  Filled 2019-11-16: qty 5

## 2019-11-16 MED ORDER — SODIUM CHLORIDE 0.9% FLUSH
10.0000 mL | INTRAVENOUS | Status: DC | PRN
  Administered 2019-11-16: 10 mL
  Filled 2019-11-16: qty 10

## 2019-11-16 NOTE — Patient Instructions (Signed)
[facility] Discharge Instructions for Patients Receiving Chemotherapy  Today you received the following chemotherapy agents:  Keytruda.  To help prevent nausea and vomiting after your treatment, we encourage you to take your nausea medication as directed.   If you develop nausea and vomiting that is not controlled by your nausea medication, call the clinic.   BELOW ARE SYMPTOMS THAT SHOULD BE REPORTED IMMEDIATELY:  *FEVER GREATER THAN 100.5 F  *CHILLS WITH OR WITHOUT FEVER  NAUSEA AND VOMITING THAT IS NOT CONTROLLED WITH YOUR NAUSEA MEDICATION  *UNUSUAL SHORTNESS OF BREATH  *UNUSUAL BRUISING OR BLEEDING  TENDERNESS IN MOUTH AND THROAT WITH OR WITHOUT PRESENCE OF ULCERS  *URINARY PROBLEMS  *BOWEL PROBLEMS  UNUSUAL RASH Items with * indicate a potential emergency and should be followed up as soon as possible.  Feel free to call the clinic should you have any questions or concerns. The clinic phone number is [phone].  Please show the CHEMO ALERT CARD at check-in to the Emergency Department and triage nurse.    

## 2019-11-16 NOTE — Progress Notes (Signed)
Ok to treat now without waiting for lab results on the CBC and CMET that was drawn today because of the delay and patient unable to wait an extended period of time. Dr. Lindi Adie gave this directive over the phone.

## 2019-11-16 NOTE — Progress Notes (Signed)
Ok to proceed with Keytruda with CMP pending today per MD Lindi Adie

## 2019-11-19 LAB — TSH: TSH: <0.08 u[IU]/mL — ABNORMAL LOW (ref 0.308–3.960)

## 2019-11-26 ENCOUNTER — Ambulatory Visit: Payer: No Typology Code available for payment source | Attending: Internal Medicine

## 2019-11-26 DIAGNOSIS — Z23 Encounter for immunization: Secondary | ICD-10-CM

## 2019-11-26 NOTE — Progress Notes (Signed)
   Covid-19 Vaccination Clinic  Name:  Kaitlyn Keith    MRN: [id number] DOB: [birth year]  11/26/2019  [name] was observed post Covid-19 immunization for 15 minutes without incident. She was provided with Vaccine Information Sheet and instruction to access the V-Safe system.   [name] was instructed to call 911 with any severe reactions post vaccine: Marland Kitchen Difficulty breathing  . Swelling of face and throat  . A fast heartbeat  . A bad rash all over body  . Dizziness and weakness   Immunizations Administered    Name Date Dose VIS Date Route   Pfizer COVID-19 Vaccine 11/26/2019 12:52 PM 0.3 mL 07/18/2018 Intramuscular   Manufacturer: [place]   Lot: [id number]   [place]: [address]-5

## 2019-11-29 ENCOUNTER — Other Ambulatory Visit: Payer: No Typology Code available for payment source

## 2019-11-29 ENCOUNTER — Ambulatory Visit: Payer: No Typology Code available for payment source

## 2019-11-30 ENCOUNTER — Telehealth: Payer: Self-pay | Admitting: *Deleted

## 2019-11-30 NOTE — Telephone Encounter (Signed)
Received call from pt stating she received her first [place] Vaccine on Monday 11/26/19 and is now experiencing sore throat and sinus congestion.  MD out of the office and pt advised to follow up with PCP for further evaluation and tx.  Pt verbalized understanding.

## 2019-12-06 ENCOUNTER — Inpatient Hospital Stay: Payer: No Typology Code available for payment source | Attending: Hematology and Oncology

## 2019-12-06 ENCOUNTER — Inpatient Hospital Stay: Payer: No Typology Code available for payment source

## 2019-12-06 ENCOUNTER — Other Ambulatory Visit: Payer: Self-pay

## 2019-12-06 ENCOUNTER — Other Ambulatory Visit: Payer: Self-pay | Admitting: *Deleted

## 2019-12-06 VITALS — BP 153/99 | HR 75 | Temp 98.4°F | Resp 18

## 2019-12-06 DIAGNOSIS — C50511 Malignant neoplasm of lower-outer quadrant of right female breast: Secondary | ICD-10-CM | POA: Diagnosis present

## 2019-12-06 DIAGNOSIS — Z5112 Encounter for antineoplastic immunotherapy: Secondary | ICD-10-CM | POA: Insufficient documentation

## 2019-12-06 DIAGNOSIS — Z171 Estrogen receptor negative status [ER-]: Secondary | ICD-10-CM

## 2019-12-06 DIAGNOSIS — C792 Secondary malignant neoplasm of skin: Secondary | ICD-10-CM

## 2019-12-06 LAB — CMP (CANCER CENTER ONLY)
ALT: 13 U/L (ref 0–44)
AST: 20 U/L (ref 15–41)
Albumin: 3.9 g/dL (ref 3.5–5.0)
Alkaline Phosphatase: 104 U/L (ref 38–126)
Anion gap: 11 (ref 5–15)
BUN: 14 mg/dL (ref 6–20)
CO2: 23 mmol/L (ref 22–32)
Calcium: 9.3 mg/dL (ref 8.9–10.3)
Chloride: 106 mmol/L (ref 98–111)
Creatinine: 0.74 mg/dL (ref 0.44–1.00)
GFR, Est AFR Am: >60 mL/min (ref >60)
GFR, Estimated: >60 mL/min (ref >60)
Glucose, Bld: 83 mg/dL (ref 70–99)
Potassium: 3.5 mmol/L (ref 3.5–5.1)
Sodium: 140 mmol/L (ref 135–145)
Total Bilirubin: 0.7 mg/dL (ref 0.3–1.2)
Total Protein: 7.9 g/dL (ref 6.5–8.1)

## 2019-12-06 LAB — CBC WITH DIFFERENTIAL (CANCER CENTER ONLY)
Abs Immature Granulocytes: 0.01 10*3/uL (ref 0.00–0.07)
Basophils Absolute: 0 10*3/uL (ref 0.0–0.1)
Basophils Relative: 0 %
Eosinophils Absolute: 0.1 10*3/uL (ref 0.0–0.5)
Eosinophils Relative: 2 %
HCT: 41.2 % (ref 36.0–46.0)
Hemoglobin: 13.8 g/dL (ref 12.0–15.0)
Immature Granulocytes: 0 %
Lymphocytes Relative: 34 %
Lymphs Abs: 1.6 10*3/uL (ref 0.7–4.0)
MCH: 29.3 pg (ref 26.0–34.0)
MCHC: 33.5 g/dL (ref 30.0–36.0)
MCV: 87.5 fL (ref 80.0–100.0)
Monocytes Absolute: 0.3 10*3/uL (ref 0.1–1.0)
Monocytes Relative: 7 %
Neutro Abs: 2.7 10*3/uL (ref 1.7–7.7)
Neutrophils Relative %: 57 %
Platelet Count: 187 10*3/uL (ref 150–400)
RBC: 4.71 MIL/uL (ref 3.87–5.11)
RDW: 12.4 % (ref 11.5–15.5)
WBC Count: 4.7 10*3/uL (ref 4.0–10.5)
nRBC: 0 % (ref 0.0–0.2)

## 2019-12-06 MED ORDER — SODIUM CHLORIDE 0.9 % IV SOLN
200.0000 mg | Freq: Once | INTRAVENOUS | Status: AC
  Administered 2019-12-06: 200 mg via INTRAVENOUS
  Filled 2019-12-06: qty 8

## 2019-12-06 MED ORDER — HEPARIN SOD (PORK) LOCK FLUSH 100 UNIT/ML IV SOLN
500.0000 [IU] | Freq: Once | INTRAVENOUS | Status: AC | PRN
  Administered 2019-12-06: 500 [IU]
  Filled 2019-12-06: qty 5

## 2019-12-06 MED ORDER — SODIUM CHLORIDE 0.9% FLUSH
10.0000 mL | INTRAVENOUS | Status: DC | PRN
  Administered 2019-12-06: 10 mL
  Filled 2019-12-06: qty 10

## 2019-12-06 MED ORDER — SODIUM CHLORIDE 0.9 % IV SOLN
Freq: Once | INTRAVENOUS | Status: AC
  Filled 2019-12-06: qty 250

## 2019-12-06 NOTE — Patient Instructions (Signed)
[facility] Discharge Instructions for Patients Receiving Chemotherapy  Today you received the following chemotherapy agents:  Keytruda.  To help prevent nausea and vomiting after your treatment, we encourage you to take your nausea medication as directed.   If you develop nausea and vomiting that is not controlled by your nausea medication, call the clinic.   BELOW ARE SYMPTOMS THAT SHOULD BE REPORTED IMMEDIATELY:  *FEVER GREATER THAN 100.5 F  *CHILLS WITH OR WITHOUT FEVER  NAUSEA AND VOMITING THAT IS NOT CONTROLLED WITH YOUR NAUSEA MEDICATION  *UNUSUAL SHORTNESS OF BREATH  *UNUSUAL BRUISING OR BLEEDING  TENDERNESS IN MOUTH AND THROAT WITH OR WITHOUT PRESENCE OF ULCERS  *URINARY PROBLEMS  *BOWEL PROBLEMS  UNUSUAL RASH Items with * indicate a potential emergency and should be followed up as soon as possible.  Feel free to call the clinic should you have any questions or concerns. The clinic phone number is [phone].  Please show the CHEMO ALERT CARD at check-in to the Emergency Department and triage nurse.    

## 2019-12-07 LAB — TSH: TSH: 0.227 u[IU]/mL — ABNORMAL LOW (ref 0.308–3.960)

## 2019-12-13 ENCOUNTER — Other Ambulatory Visit: Payer: Self-pay | Admitting: Hematology and Oncology

## 2019-12-14 ENCOUNTER — Telehealth: Payer: Self-pay | Admitting: *Deleted

## 2019-12-14 ENCOUNTER — Other Ambulatory Visit: Payer: Self-pay

## 2019-12-14 ENCOUNTER — Ambulatory Visit (HOSPITAL_COMMUNITY)
Admission: RE | Admit: 2019-12-14 | Discharge: 2019-12-14 | Disposition: A | Discharge status: Home / Self Care | Payer: No Typology Code available for payment source | Source: Ambulatory Visit | Attending: Hematology and Oncology | Admitting: Hematology and Oncology

## 2019-12-14 DIAGNOSIS — Z171 Estrogen receptor negative status [ER-]: Secondary | ICD-10-CM | POA: Insufficient documentation

## 2019-12-14 DIAGNOSIS — C50511 Malignant neoplasm of lower-outer quadrant of right female breast: Secondary | ICD-10-CM | POA: Insufficient documentation

## 2019-12-14 MED ORDER — SODIUM CHLORIDE (PF) 0.9 % IJ SOLN
INTRAMUSCULAR | Status: AC
  Filled 2019-12-14: qty 50

## 2019-12-14 MED ORDER — HEPARIN SOD (PORK) LOCK FLUSH 100 UNIT/ML IV SOLN
INTRAVENOUS | Status: AC
  Filled 2019-12-14: qty 5

## 2019-12-14 MED ORDER — HEPARIN SOD (PORK) LOCK FLUSH 100 UNIT/ML IV SOLN
500.0000 [IU] | Freq: Once | INTRAVENOUS | Status: AC
  Administered 2019-12-14: 500 [IU] via INTRAVENOUS

## 2019-12-14 MED ORDER — IOHEXOL 300 MG/ML  SOLN
100.0000 mL | Freq: Once | INTRAMUSCULAR | Status: AC | PRN
  Administered 2019-12-14: 100 mL via INTRAVENOUS

## 2019-12-14 NOTE — Telephone Encounter (Signed)
Called pt to provide CT scan results. Encourage pt to call with needs or concerns.

## 2019-12-19 ENCOUNTER — Ambulatory Visit: Payer: No Typology Code available for payment source

## 2019-12-20 ENCOUNTER — Ambulatory Visit: Payer: No Typology Code available for payment source

## 2019-12-20 ENCOUNTER — Other Ambulatory Visit: Payer: Self-pay | Admitting: Hematology and Oncology

## 2019-12-21 ENCOUNTER — Ambulatory Visit: Payer: No Typology Code available for payment source

## 2019-12-22 ENCOUNTER — Ambulatory Visit: Payer: No Typology Code available for payment source | Attending: Internal Medicine

## 2019-12-22 ENCOUNTER — Ambulatory Visit: Payer: No Typology Code available for payment source

## 2019-12-22 DIAGNOSIS — Z23 Encounter for immunization: Secondary | ICD-10-CM

## 2019-12-22 NOTE — Progress Notes (Signed)
   Covid-19 Vaccination Clinic  Name:  Kaitlyn Keith    MRN: [id number] DOB: 08-Oct-1965  12/22/2019  [name] was observed post Covid-19 immunization for 15 minutes without incident. She was provided with Vaccine Information Sheet and instruction to access the V-Safe system.   [name] was instructed to call 911 with any severe reactions post vaccine: Marland Kitchen Difficulty breathing  . Swelling of face and throat  . A fast heartbeat  . A bad rash all over body  . Dizziness and weakness   Immunizations Administered    Name Date Dose VIS Date Route   Pfizer COVID-19 Vaccine 12/22/2019 10:58 AM 0.3 mL 07/18/2018 Intramuscular   Manufacturer: [employer]   Lot: [id number]    [place]: [id number]

## 2019-12-25 ENCOUNTER — Other Ambulatory Visit: Payer: Self-pay

## 2019-12-25 DIAGNOSIS — Z171 Estrogen receptor negative status [ER-]: Secondary | ICD-10-CM

## 2019-12-25 DIAGNOSIS — C50511 Malignant neoplasm of lower-outer quadrant of right female breast: Secondary | ICD-10-CM

## 2019-12-25 NOTE — Progress Notes (Signed)
Patient Care Team: Patient, [name] as PCP - General (General Practice) [name], [name], [name], [name], [name], [name], NP (Inactive) as Nurse Practitioner (Nurse Practitioner)  DIAGNOSIS:    ICD-10-CM   1. 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, estrogen receptor negative ([place])  C50.511    Z17.1     SUMMARY OF ONCOLOGIC HISTORY: Oncology History  Breast cancer of lower-outer quadrant of right female breast ([place])  12/03/2014 Mammogram   Right breast mass 1.9 cm it o'clock position 8 cm depth from the nipple   12/03/2014 Initial Diagnosis   Right breast biopsy 8:00: Invasive ductal carcinoma, grade 3, ER 0%, PR 0%, Ki-67 90%, HER-2 negative ratio 1.43   12/10/2014 Breast MRI   Right breast lower outer quadrant: 2.3 x 2.4 x 2.4 cm rim-enhancing mass abuts the pectoralis fascia but no enhancement of pectoralis muscle, second focus of artifact?'s second tissue marker clip, no lymph nodes   12/10/2014 Clinical Stage   Stage IIA: T2 N0   12/24/2014 - 04/29/2015 Neo-Adjuvant Chemotherapy   Dose dense Adriamycin and Cytoxan 4 followed by weekly Abraxane 12   05/02/2015 Breast MRI   complete radiologic response   06/16/2015 Surgery   Left Lumpectomy: Complete path Response, 0/2 LN   06/16/2015 Pathologic Stage   ypT0 ypN0   07/23/2015 - 09/05/2015 Radiation Therapy   Adjuvant RT: 50.4 Gy in 28 fractions and a boost of 10 Gy in 5 fractions to total dose of 60.4 Gy   10/24/2015 Survivorship   SCP mailed to patient in lieu of in person visit.   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 in BARD 1, BRCA2 and NF1   12/31/2016 Miscellaneous   Everolimus 10 mg daily for cycle 1 if she cannot tolerate will decrease to 7.5 mg daily   05/24/2017 - 07/04/2017 Chemotherapy   Xeloda 2000 mg 2 weeks on 1 week off stopped due to progression of disease based on CT scans done 06/27/2017   07/25/2017 - 03/13/2018 Chemotherapy   [place] days 1 and 8 every 3 weeks stopped for progression    03/20/2018 - 03/31/2018 Radiation Therapy   Radiation to lymph nodes   04/03/2018 -  Chemotherapy   Keytruda every 3 weeks      CHIEF COMPLIANT: Follow-up of metastatic breast canceronKeytruda  INTERVAL HISTORY: Kaitlyn Keith is a 54 y.o. with above-mentioned history of metastatic breast cancer who is currently receiving pembrolizumab immunotherapyevery 3 weeks.CT CAP on 12/14/19 showed no findings to suggest metastatic disease. She presents to the clinic todayfor treatment. She reports to be feeling tired  but otherwise doing quite well.  ALLERGIES:  has No Known Allergies.  MEDICATIONS:  Current Outpatient Medications  Medication Sig Dispense Refill  . alprazolam (XANAX) 2 MG tablet Take 1 tablet (2 mg total) by mouth at bedtime. 30 tablet 5  . betamethasone valerate ointment (VALISONE) 0.1 % Apply 1 application topically 2 (two) times daily.  30 g 1  . cyclobenzaprine (FLEXERIL) 5 MG tablet Take 1 tablet (5 mg total) by mouth 3 (three) times daily as needed for muscle spasms. 30 tablet 0  . levothyroxine (SYNTHROID) 125 MCG tablet TAKE 1 TABLET BY MOUTH EVERY DAY BEFORE BREAKFAST 90 tablet 0  . lidocaine-prilocaine (EMLA) cream APPLY TO AFFECTED AREA ONCE AS DIRECTED 30 g 3  . lisinopril-hydrochlorothiazide (ZESTORETIC) 20-12.5 MG tablet TAKE 1 TABLET BY MOUTH TWICE A DAY 180 tablet 3  . triamcinolone ointment (KENALOG) 0.5 % APPLY TO AFFECTED AREA TWICE A DAY 30 g 0   No current facility-administered medications for this visit.   Facility-Administered Medications Ordered in Other Visits  Medication Dose Route Frequency Provider Last Rate Last Admin  . sodium chloride flush (NS) 0.9 % injection 10 mL  10 mL Intracatheter PRN [name], MD   10 mL at 12/26/19 1418    PHYSICAL EXAMINATION: ECOG PERFORMANCE STATUS: 1 - Symptomatic but completely ambulatory  There were no vitals filed for this visit. There were no vitals filed for this visit.  LABORATORY DATA:  I have reviewed the data as listed CMP Latest Ref Rng & Units 12/06/2019 11/16/2019 10/17/2019  Glucose 70 - 99 mg/dL 83 93 96  BUN 6 - 20 mg/dL _0 Creatinine 0.44 - 1.00 mg/dL 0.74 0.75 0.76  Sodium 135 - 145 mmol/L 140 140 139  Potassium 3.5 - 5.1 mmol/L 3.5 3.5 3.7  Chloride 98 - 111 mmol/L 106 108 104  CO2 22 - 32 mmol/L _1 Calcium 8.9 - 10.3 mg/dL 9.3 8.7(L) 9.3  Total Protein 6.5 - 8.1 g/dL 7.9 6.8 7.6  Total Bilirubin 0.3 - 1.2 mg/dL 0.7 0.5 0.6  Alkaline Phos 38 - 126 U/L 104 100 102  AST 15 - 41 U/L _2 ALT 0 - 44 U/L _3 Lab Results  Component Value Date   WBC 3.3 (L) 12/26/2019   HGB 12.7 12/26/2019   HCT 39.5 12/26/2019   MCV 88.0 12/26/2019   PLT 193 12/26/2019   NEUTROABS 1.7 12/26/2019    ASSESSMENT & PLAN:  Breast cancer of lower-outer quadrant of right female breast ([place]) Right breast biopsy 12/03/2014  8:00: Invasive ductal carcinoma, grade 3, ER 0%, PR 0%, 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 Neoadj chemo 12/24/14- 04/29/15 AC x 4 foll by Abraxane X 12 Rt Lumpectomy: Path CR 0/2 LN Adj XRT 07/23/15- 09/08/15 PET/CT scan 11/17/2016:Subcutaneous nodules in the neck, upper back, left arm, abdomen and pelvis Patient progressed on Xeloda January 2019-07/04/2017 stopped due to progression of disease Cerebellar mass diagnosed 07/12/2016: Resection followed by stereotactic radiation 07/29/2016 Lymph node from 03/02/2018 biopsy: PDL1+ ------------------------------------------------------------------------------------------------------------------------------------------------ Current treatment:Pembrolizumab given every 3 weeks starting 04/03/18, todayiscycle 26  Keytruda toxicities: 1.Severe scalp pain along the surgical scars very intense lasting for 48 hours improved with Aleve.Takes gabapentin, applies triamcinolone ointment 2.Right eye dryness: dexamethasone eyedrops. 3.Hypothyroidism:Synthroid123mg.   CT CAP:12/14/2019: No findings of metastatic disease  in chest abdomen pelvis  Brain MRIFebruary 2021:Stable 3.77m nodule, unchanged   [facility] in 6 weeks. Our plan is to complete 2 years of therapy which will be end of October 2021 and after that we can hold off on Keytruda infusions. We will subsequently monitor her with scans every few months. Patient also wants to get the port out so that she can feel a normal for her as long as possible. She is looking forward to this winter to be without a port and without requiring infusions.   No orders of the defined types were placed in this encounter.  The patient has a good understanding of the overall plan. she agrees with it. she will call with any  problems that may develop before the next visit here.  Total time spent: 30 mins including face to face time and time spent for planning, charting and coordination of care  GNicholas Lose MD 12/26/2019  I, MCloyde ReamsDorshimer, am acting as scribe for Dr. VNicholas Lose  I have reviewed the above documentation for accuracy and completeness, and I agree with the above.

## 2019-12-26 ENCOUNTER — Inpatient Hospital Stay (HOSPITAL_BASED_OUTPATIENT_CLINIC_OR_DEPARTMENT_OTHER): Payer: No Typology Code available for payment source | Admitting: Hematology and Oncology

## 2019-12-26 ENCOUNTER — Inpatient Hospital Stay: Payer: No Typology Code available for payment source

## 2019-12-26 ENCOUNTER — Inpatient Hospital Stay: Payer: No Typology Code available for payment source | Attending: Hematology and Oncology

## 2019-12-26 ENCOUNTER — Other Ambulatory Visit: Payer: Self-pay

## 2019-12-26 VITALS — BP 118/81 | HR 67 | Temp 98.2°F | Resp 18 | Ht 63.0 in | Wt 170.6 lb

## 2019-12-26 DIAGNOSIS — R590 Localized enlarged lymph nodes: Secondary | ICD-10-CM | POA: Insufficient documentation

## 2019-12-26 DIAGNOSIS — Z171 Estrogen receptor negative status [ER-]: Secondary | ICD-10-CM

## 2019-12-26 DIAGNOSIS — Z9221 Personal history of antineoplastic chemotherapy: Secondary | ICD-10-CM | POA: Diagnosis not present

## 2019-12-26 DIAGNOSIS — Z5112 Encounter for antineoplastic immunotherapy: Secondary | ICD-10-CM | POA: Diagnosis present

## 2019-12-26 DIAGNOSIS — Z79899 Other long term (current) drug therapy: Secondary | ICD-10-CM | POA: Insufficient documentation

## 2019-12-26 DIAGNOSIS — E039 Hypothyroidism, unspecified: Secondary | ICD-10-CM | POA: Diagnosis not present

## 2019-12-26 DIAGNOSIS — C792 Secondary malignant neoplasm of skin: Secondary | ICD-10-CM

## 2019-12-26 DIAGNOSIS — C7931 Secondary malignant neoplasm of brain: Secondary | ICD-10-CM | POA: Diagnosis not present

## 2019-12-26 DIAGNOSIS — C50511 Malignant neoplasm of lower-outer quadrant of right female breast: Secondary | ICD-10-CM | POA: Diagnosis present

## 2019-12-26 DIAGNOSIS — Z923 Personal history of irradiation: Secondary | ICD-10-CM | POA: Diagnosis not present

## 2019-12-26 LAB — CBC WITH DIFFERENTIAL (CANCER CENTER ONLY)
Abs Immature Granulocytes: 0.01 10*3/uL (ref 0.00–0.07)
Basophils Absolute: 0 10*3/uL (ref 0.0–0.1)
Basophils Relative: 1 %
Eosinophils Absolute: 0.1 10*3/uL (ref 0.0–0.5)
Eosinophils Relative: 2 %
HCT: 39.5 % (ref 36.0–46.0)
Hemoglobin: 12.7 g/dL (ref 12.0–15.0)
Immature Granulocytes: 0 %
Lymphocytes Relative: 35 %
Lymphs Abs: 1.2 10*3/uL (ref 0.7–4.0)
MCH: 28.3 pg (ref 26.0–34.0)
MCHC: 32.2 g/dL (ref 30.0–36.0)
MCV: 88 fL (ref 80.0–100.0)
Monocytes Absolute: 0.4 10*3/uL (ref 0.1–1.0)
Monocytes Relative: 11 %
Neutro Abs: 1.7 10*3/uL (ref 1.7–7.7)
Neutrophils Relative %: 51 %
Platelet Count: 193 10*3/uL (ref 150–400)
RBC: 4.49 MIL/uL (ref 3.87–5.11)
RDW: 12.6 % (ref 11.5–15.5)
WBC Count: 3.3 10*3/uL — ABNORMAL LOW (ref 4.0–10.5)
nRBC: 0 % (ref 0.0–0.2)

## 2019-12-26 LAB — CMP (CANCER CENTER ONLY)
ALT: 8 U/L (ref 0–44)
AST: 16 U/L (ref 15–41)
Albumin: 3.6 g/dL (ref 3.5–5.0)
Alkaline Phosphatase: 93 U/L (ref 38–126)
Anion gap: 9 (ref 5–15)
BUN: 11 mg/dL (ref 6–20)
CO2: 26 mmol/L (ref 22–32)
Calcium: 9.9 mg/dL (ref 8.9–10.3)
Chloride: 107 mmol/L (ref 98–111)
Creatinine: 0.78 mg/dL (ref 0.44–1.00)
GFR, Est AFR Am: >60 mL/min (ref >60)
GFR, Estimated: >60 mL/min (ref >60)
Glucose, Bld: 97 mg/dL (ref 70–99)
Potassium: 3.7 mmol/L (ref 3.5–5.1)
Sodium: 142 mmol/L (ref 135–145)
Total Bilirubin: 0.7 mg/dL (ref 0.3–1.2)
Total Protein: 7.3 g/dL (ref 6.5–8.1)

## 2019-12-26 LAB — TSH: TSH: 0.184 u[IU]/mL — ABNORMAL LOW (ref 0.308–3.960)

## 2019-12-26 MED ORDER — SODIUM CHLORIDE 0.9% FLUSH
10.0000 mL | INTRAVENOUS | Status: DC | PRN
  Administered 2019-12-26: 10 mL
  Filled 2019-12-26: qty 10

## 2019-12-26 MED ORDER — SODIUM CHLORIDE 0.9 % IV SOLN
Freq: Once | INTRAVENOUS | Status: AC
  Filled 2019-12-26: qty 250

## 2019-12-26 MED ORDER — HEPARIN SOD (PORK) LOCK FLUSH 100 UNIT/ML IV SOLN
500.0000 [IU] | Freq: Once | INTRAVENOUS | Status: AC | PRN
  Administered 2019-12-26: 500 [IU]
  Filled 2019-12-26: qty 5

## 2019-12-26 MED ORDER — SODIUM CHLORIDE 0.9 % IV SOLN
200.0000 mg | Freq: Once | INTRAVENOUS | Status: AC
  Administered 2019-12-26: 200 mg via INTRAVENOUS
  Filled 2019-12-26: qty 8

## 2019-12-26 NOTE — Progress Notes (Signed)
Pt did experience some discomfort due to forgetting to apply her elma cream for today's visit. Pt denied need for ice before needle stick. Educated pt on elma cream being applied an hour before flush appointment and that ice will also help decrease some but not all pain. Pt verbalized understanding.

## 2019-12-26 NOTE — Assessment & Plan Note (Signed)
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 Neoadj chemo 12/24/14- 04/29/15 AC x 4 foll by Abraxane X 12 Rt Lumpectomy: Path CR 0/2 LN Adj XRT 07/23/15- 09/08/15 PET/CT scan 11/17/2016:Subcutaneous nodules in the neck, upper back, left arm, abdomen and pelvis Patient progressed on Xeloda January 2019-07/04/2017 stopped due to progression of disease Cerebellar mass diagnosed 07/12/2016: Resection followed by stereotactic radiation 07/29/2016 Lymph node from 03/02/2018 biopsy: PDL1+ ------------------------------------------------------------------------------------------------------------------------------------------------ Current treatment:Pembrolizumab given every 3 weeks starting 04/03/18, todayiscycle 26  Keytruda toxicities: 1.Severe scalp pain along the surgical scars very intense lasting for 48 hours improved with Aleve.Takes gabapentin, applies triamcinolone ointment 2.Right eye dryness: dexamethasone eyedrops. 3.Hypothyroidism:Synthroid121mg.   CT CAP:12/14/2019: No findings of metastatic disease in chest abdomen pelvis  Brain MRIFebruary 2021:Stable 3.737mnodule, unchanged We will plan restaging scans in July [facility] will see her again in 6 weeks.

## 2019-12-26 NOTE — Patient Instructions (Signed)
[facility] Discharge Instructions for Patients Receiving Chemotherapy  Today you received the following chemotherapy agents:  Keytruda.  To help prevent nausea and vomiting after your treatment, we encourage you to take your nausea medication as directed.   If you develop nausea and vomiting that is not controlled by your nausea medication, call the clinic.   BELOW ARE SYMPTOMS THAT SHOULD BE REPORTED IMMEDIATELY:  *FEVER GREATER THAN 100.5 F  *CHILLS WITH OR WITHOUT FEVER  NAUSEA AND VOMITING THAT IS NOT CONTROLLED WITH YOUR NAUSEA MEDICATION  *UNUSUAL SHORTNESS OF BREATH  *UNUSUAL BRUISING OR BLEEDING  TENDERNESS IN MOUTH AND THROAT WITH OR WITHOUT PRESENCE OF ULCERS  *URINARY PROBLEMS  *BOWEL PROBLEMS  UNUSUAL RASH Items with * indicate a potential emergency and should be followed up as soon as possible.  Feel free to call the clinic should you have any questions or concerns. The clinic phone number is [phone].  Please show the CHEMO ALERT CARD at check-in to the Emergency Department and triage nurse.    

## 2020-01-01 ENCOUNTER — Telehealth: Payer: Self-pay | Admitting: Hematology and Oncology

## 2020-01-01 NOTE — Telephone Encounter (Signed)
Scheduled per 8/5 los. Called and spoke with pt, confirmed added appts  

## 2020-01-18 ENCOUNTER — Ambulatory Visit: Payer: No Typology Code available for payment source

## 2020-01-19 ENCOUNTER — Other Ambulatory Visit: Payer: No Typology Code available for payment source

## 2020-01-22 ENCOUNTER — Inpatient Hospital Stay: Payer: No Typology Code available for payment source

## 2020-01-22 ENCOUNTER — Telehealth: Payer: Self-pay | Admitting: *Deleted

## 2020-01-22 ENCOUNTER — Other Ambulatory Visit: Payer: Self-pay

## 2020-01-22 ENCOUNTER — Inpatient Hospital Stay (HOSPITAL_BASED_OUTPATIENT_CLINIC_OR_DEPARTMENT_OTHER): Payer: No Typology Code available for payment source | Admitting: Hematology and Oncology

## 2020-01-22 ENCOUNTER — Other Ambulatory Visit: Payer: Self-pay | Admitting: *Deleted

## 2020-01-22 DIAGNOSIS — C50511 Malignant neoplasm of lower-outer quadrant of right female breast: Secondary | ICD-10-CM

## 2020-01-22 DIAGNOSIS — Z171 Estrogen receptor negative status [ER-]: Secondary | ICD-10-CM | POA: Diagnosis not present

## 2020-01-22 DIAGNOSIS — C792 Secondary malignant neoplasm of skin: Secondary | ICD-10-CM

## 2020-01-22 DIAGNOSIS — Z5112 Encounter for antineoplastic immunotherapy: Secondary | ICD-10-CM | POA: Diagnosis not present

## 2020-01-22 LAB — CMP (CANCER CENTER ONLY)
ALT: 11 U/L (ref 0–44)
AST: 19 U/L (ref 15–41)
Albumin: 3.4 g/dL — ABNORMAL LOW (ref 3.5–5.0)
Alkaline Phosphatase: 84 U/L (ref 38–126)
Anion gap: 6 (ref 5–15)
BUN: 12 mg/dL (ref 6–20)
CO2: 27 mmol/L (ref 22–32)
Calcium: 9.7 mg/dL (ref 8.9–10.3)
Chloride: 107 mmol/L (ref 98–111)
Creatinine: 0.67 mg/dL (ref 0.44–1.00)
GFR, Est AFR Am: >60 mL/min (ref >60)
GFR, Estimated: >60 mL/min (ref >60)
Glucose, Bld: 79 mg/dL (ref 70–99)
Potassium: 3.4 mmol/L — ABNORMAL LOW (ref 3.5–5.1)
Sodium: 140 mmol/L (ref 135–145)
Total Bilirubin: 0.5 mg/dL (ref 0.3–1.2)
Total Protein: 6.7 g/dL (ref 6.5–8.1)

## 2020-01-22 LAB — CBC WITH DIFFERENTIAL (CANCER CENTER ONLY)
Abs Immature Granulocytes: 0.01 10*3/uL (ref 0.00–0.07)
Basophils Absolute: 0 10*3/uL (ref 0.0–0.1)
Basophils Relative: 1 %
Eosinophils Absolute: 0.1 10*3/uL (ref 0.0–0.5)
Eosinophils Relative: 3 %
HCT: 36.3 % (ref 36.0–46.0)
Hemoglobin: 11.9 g/dL — ABNORMAL LOW (ref 12.0–15.0)
Immature Granulocytes: 0 %
Lymphocytes Relative: 30 %
Lymphs Abs: 1.2 10*3/uL (ref 0.7–4.0)
MCH: 28.8 pg (ref 26.0–34.0)
MCHC: 32.8 g/dL (ref 30.0–36.0)
MCV: 87.9 fL (ref 80.0–100.0)
Monocytes Absolute: 0.4 10*3/uL (ref 0.1–1.0)
Monocytes Relative: 9 %
Neutro Abs: 2.4 10*3/uL (ref 1.7–7.7)
Neutrophils Relative %: 57 %
Platelet Count: 186 10*3/uL (ref 150–400)
RBC: 4.13 MIL/uL (ref 3.87–5.11)
RDW: 13.2 % (ref 11.5–15.5)
WBC Count: 4.1 10*3/uL (ref 4.0–10.5)
nRBC: 0 % (ref 0.0–0.2)

## 2020-01-22 MED ORDER — SODIUM CHLORIDE 0.9 % IV SOLN
Freq: Once | INTRAVENOUS | Status: AC
  Filled 2020-01-22: qty 250

## 2020-01-22 MED ORDER — HEPARIN SOD (PORK) LOCK FLUSH 100 UNIT/ML IV SOLN
500.0000 [IU] | Freq: Once | INTRAVENOUS | Status: AC | PRN
  Administered 2020-01-22: 500 [IU]
  Filled 2020-01-22: qty 5

## 2020-01-22 MED ORDER — SODIUM CHLORIDE 0.9% FLUSH
10.0000 mL | INTRAVENOUS | Status: DC | PRN
  Administered 2020-01-22: 10 mL
  Filled 2020-01-22: qty 10

## 2020-01-22 MED ORDER — SODIUM CHLORIDE 0.9 % IV SOLN
200.0000 mg | Freq: Once | INTRAVENOUS | Status: AC
  Administered 2020-01-22: 200 mg via INTRAVENOUS
  Filled 2020-01-22: qty 8

## 2020-01-22 NOTE — Assessment & Plan Note (Signed)
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 Neoadj chemo 12/24/14- 04/29/15 AC x 4 foll by Abraxane X 12 Rt Lumpectomy: Path CR 0/2 LN Adj XRT 07/23/15- 09/08/15 PET/CT scan 11/17/2016:Subcutaneous nodules in the neck, upper back, left arm, abdomen and pelvis Patient progressed on Xeloda January 2019-07/04/2017 stopped due to progression of disease Cerebellar mass diagnosed 07/12/2016: Resection followed by stereotactic radiation 07/29/2016 Lymph node from 03/02/2018 biopsy: PDL1+ ------------------------------------------------------------------------------------------------------------------------------------------------ Current treatment:Pembrolizumab given every 3 weeks starting 04/03/18, todayiscycle 26  Keytruda toxicities: 1.Severe scalp pain along the surgical scars very intense lasting for 48 hours improved with Aleve.Takes gabapentin, applies triamcinolone ointment 2.Right eye dryness: dexamethasone eyedrops. 3.Hypothyroidism:Synthroid141mg.   CT CAP:12/14/2019: No findings of metastatic disease in chest abdomen pelvis  Brain MRIFebruary 2021:Stable 3.7107mnodule, unchanged   [facility] will see her again in 6 weeks. Our plan is to complete 2 years of therapy which will be end of October 2021 and after that we can hold off on Keytruda infusions. We will subsequently monitor her with scans every few months. Patient also wants to get the port out so that she can feel a normal for her as long as possible. She is looking forward to this winter to be without a port and without requiring infusions.

## 2020-01-22 NOTE — Progress Notes (Signed)
Patient Care Team: Patient, [name] as PCP - General (General Practice) [name], [name], [name], [name], [name], [name], NP (Inactive) as Nurse Practitioner (Nurse Practitioner)  DIAGNOSIS:  Encounter Diagnosis  Name Primary?   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%, HER-2 negative ratio 1.43   12/10/2014 Breast MRI   Right breast lower outer quadrant: 2.3 x 2.4 x 2.4 cm rim-enhancing mass abuts the pectoralis fascia but no enhancement of pectoralis muscle, second focus of artifact?'s second tissue marker clip, no lymph nodes   12/10/2014 Clinical Stage   Stage IIA: T2 N0   12/24/2014 - 04/29/2015 Neo-Adjuvant Chemotherapy   Dose dense Adriamycin and Cytoxan 4 followed by weekly Abraxane 12   05/02/2015 Breast MRI   complete radiologic response   06/16/2015 Surgery   Left Lumpectomy: Complete path Response, 0/2 LN   06/16/2015 Pathologic Stage   ypT0 ypN0   07/23/2015 - 09/05/2015 Radiation Therapy   Adjuvant RT: 50.4 Gy in 28 fractions and a boost of 10 Gy in 5 fractions to total dose of 60.4 Gy   10/24/2015 Survivorship   SCP mailed to patient in lieu of in person visit.   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 in BARD 1, BRCA2 and NF1   12/31/2016 Miscellaneous   Everolimus 10 mg daily for cycle 1 if she cannot tolerate will decrease to 7.5 mg daily   05/24/2017 - 07/04/2017 Chemotherapy   Xeloda 2000 mg 2 weeks on 1 week off stopped due to progression of disease based on CT scans done 06/27/2017   07/25/2017 - 03/13/2018 Chemotherapy   [place] days 1 and 8 every 3 weeks stopped for progression    03/20/2018 - 03/31/2018 Radiation Therapy   Radiation to lymph nodes   04/03/2018 -  Chemotherapy   Keytruda every 3 weeks      CHIEF COMPLIANT: New palpable lymph nodes in the right axilla  INTERVAL HISTORY: Kaitlyn Keith is a 54 year old with above-mentioned for metastatic breast cancer who is being doing extremely well on Keytruda.  She had a COVID-19 vaccine about a month ago and felt to enlarged lymph nodes in the right axilla and got very anxious and came in  to see [place] today.  She has an appointment for infusion with Keytruda today.  The lymph nodes are without any pain or discomfort.   ALLERGIES:  has No Known Allergies.  MEDICATIONS:  Current Outpatient Medications  Medication Sig Dispense Refill   alprazolam (XANAX) 2 MG tablet Take 1 tablet (2 mg total) by mouth at bedtime. 30 tablet 5   betamethasone valerate ointment (VALISONE) 0.1 %  Apply 1 application topically 2 (two) times daily. 30 g 1   cyclobenzaprine (FLEXERIL) 5 MG tablet Take 1 tablet (5 mg total) by mouth 3 (three) times daily as needed for muscle spasms. 30 tablet 0   levothyroxine (SYNTHROID) 125 MCG tablet TAKE 1 TABLET BY MOUTH EVERY DAY BEFORE BREAKFAST 90 tablet 0   lidocaine-prilocaine (EMLA) cream APPLY TO AFFECTED AREA ONCE AS DIRECTED 30 g 3   lisinopril-hydrochlorothiazide (ZESTORETIC) 20-12.5 MG tablet TAKE 1 TABLET BY MOUTH TWICE A DAY 180 tablet 3   triamcinolone ointment (KENALOG) 0.5 % APPLY TO AFFECTED AREA TWICE A DAY 30 g 0   No current facility-administered medications for this visit.   Facility-Administered Medications Ordered in Other Visits  Medication Dose Route Frequency Provider Last Rate Last Admin   heparin lock flush 100 unit/mL  500 Units Intracatheter Once PRN [name], MD       pembrolizumab [facility]) 200 mg in sodium chloride 0.9 % 50 mL chemo infusion  200 mg Intravenous Once [name], MD       sodium chloride flush (NS) 0.9 % injection 10 mL  10 mL Intracatheter PRN [name], MD        PHYSICAL EXAMINATION: ECOG PERFORMANCE STATUS: 1 - Symptomatic but completely ambulatory  Vitals:   01/22/20 1514  BP: (!) 147/77  Pulse: (!) 57  Resp: 20  Temp: 97.9 F (36.6 C)  SpO2: 100%   Filed Weights   01/22/20 1514  Weight: 177 lb 4.8 oz (80.4 kg)    BREAST: 2 lymph nodes are palpable in the right axilla   LABORATORY DATA:  I have reviewed the data as listed CMP Latest Ref Rng & Units 01/22/2020 12/26/2019 12/06/2019  Glucose 70 - 99 mg/dL 79 97 83  BUN 6 - 20 mg/dL '12 11 14  ' Creatinine 0.44 - 1.00 mg/dL 0.67 0.78 0.74  Sodium 135 - 145 mmol/L 140 142 140  Potassium 3.5 - 5.1 mmol/L 3.4(L) 3.7 3.5  Chloride 98 - 111 mmol/L 107 107 106  CO2 22 - 32 mmol/L '27 26 23  ' Calcium 8.9 - 10.3 mg/dL 9.7 9.9 9.3  Total Protein 6.5 - 8.1 g/dL 6.7 7.3 7.9  Total Bilirubin 0.3 - 1.2 mg/dL 0.5 0.7 0.7    Alkaline Phos 38 - 126 U/L 84 93 104  AST 15 - 41 U/L '19 16 20  ' ALT 0 - 44 U/L '11 8 13    ' Lab Results  Component Value Date   WBC 4.1 01/22/2020   HGB 11.9 (L) 01/22/2020   HCT 36.3 01/22/2020   MCV 87.9 01/22/2020   PLT 186 01/22/2020   NEUTROABS 2.4 01/22/2020    ASSESSMENT & PLAN:  Breast cancer of lower-outer quadrant of right female breast ([place]) Right breast biopsy 12/03/2014 8:00: Invasive ductal carcinoma, grade 3, ER 0%, PR 0%, 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 Neoadj chemo 12/24/14- 04/29/15 AC x 4 foll by  Abraxane X 12 Rt Lumpectomy: Path CR 0/2 LN Adj XRT 07/23/15- 09/08/15 PET/CT scan 11/17/2016:Subcutaneous nodules in the neck, upper back, left arm, abdomen and pelvis Patient progressed on Xeloda January 2019-07/04/2017 stopped due to progression of disease Cerebellar mass diagnosed 07/12/2016: Resection followed by stereotactic radiation 07/29/2016 Lymph node from 03/02/2018 biopsy: PDL1+ ------------------------------------------------------------------------------------------------------------------------------------------------ Current treatment:Pembrolizumab given every 3 weeks starting 04/03/18, todayiscycle 26  Keytruda toxicities: 1.Severe scalp pain along the surgical scars very intense lasting for 48 hours improved with Aleve.Takes gabapentin, applies triamcinolone ointment 2.Right eye dryness: dexamethasone eyedrops. 3.Hypothyroidism:Synthroid187mg.   CT CAP:12/14/2019: No findings of metastatic disease in chest abdomen pelvis  Brain MRIFebruary 2021:Stable 3.721mnodule, unchanged   Our plan is to complete 2 years of therapy which will be end of October 2021 and after that we can hold off on Keytruda infusions. We will subsequently monitor her with scans every few months. Patient also wants to get the port out so  that she can feel a normal for her as long as possible. She is looking forward to this winter to be without a port and without requiring infusions.  Enlarged lymph nodes in axilla: I reassured her that this is most likely related to COVID-19 vaccine.  If these lymph nodes persist beyond the next 3 weeks then we will consider obtaining an ultrasound and biopsy.  I anticipate that these lymph nodes will disappear very shortly.  Return to clinic in 3 weeks for follow-up   No orders of the defined types were placed in this encounter.  The patient has a good understanding of the overall plan. she agrees with it. she will call with any problems that may develop before the next visit here. Total time spent: 30 mins including face to face time and time spent for planning, charting and co-ordination of care   ViHarriette OharaMD 01/22/20

## 2020-01-22 NOTE — Patient Instructions (Signed)
[facility] Discharge Instructions for Patients Receiving Chemotherapy  Today you received the following chemotherapy agents:  Keytruda.  To help prevent nausea and vomiting after your treatment, we encourage you to take your nausea medication as directed.   If you develop nausea and vomiting that is not controlled by your nausea medication, call the clinic.   BELOW ARE SYMPTOMS THAT SHOULD BE REPORTED IMMEDIATELY:  *FEVER GREATER THAN 100.5 F  *CHILLS WITH OR WITHOUT FEVER  NAUSEA AND VOMITING THAT IS NOT CONTROLLED WITH YOUR NAUSEA MEDICATION  *UNUSUAL SHORTNESS OF BREATH  *UNUSUAL BRUISING OR BLEEDING  TENDERNESS IN MOUTH AND THROAT WITH OR WITHOUT PRESENCE OF ULCERS  *URINARY PROBLEMS  *BOWEL PROBLEMS  UNUSUAL RASH Items with * indicate a potential emergency and should be followed up as soon as possible.  Feel free to call the clinic should you have any questions or concerns. The clinic phone number is [phone].  Please show the CHEMO ALERT CARD at check-in to the Emergency Department and triage nurse.    

## 2020-01-22 NOTE — Telephone Encounter (Signed)
Received call from pt with complaints of swollen nodules under under arm x2 weeks.  Pt denies recent injury or trauma.  Pt states she received her Covid vaccine in her right arm 4 weeks ago.  Pt concerned and requesting to be evaluated by MD.  Apt scheduled for today prior to infusion.  Pt verbalized understanding of apt date and time.

## 2020-01-23 ENCOUNTER — Telehealth: Payer: Self-pay | Admitting: Urology

## 2020-01-23 LAB — TSH: TSH: 1.61 u[IU]/mL (ref 0.308–3.960)

## 2020-01-24 ENCOUNTER — Telehealth: Payer: Self-pay | Admitting: Hematology and Oncology

## 2020-01-24 NOTE — Telephone Encounter (Signed)
No 8/31 los, no changes made to pt schedule

## 2020-02-02 ENCOUNTER — Ambulatory Visit
Admission: RE | Admit: 2020-02-02 | Discharge: 2020-02-02 | Disposition: A | Discharge status: Home / Self Care | Payer: No Typology Code available for payment source | Source: Ambulatory Visit | Attending: Radiation Oncology | Admitting: Radiation Oncology

## 2020-02-02 DIAGNOSIS — C7931 Secondary malignant neoplasm of brain: Secondary | ICD-10-CM

## 2020-02-02 MED ORDER — GADOBENATE DIMEGLUMINE 529 MG/ML IV SOLN
17.0000 mL | Freq: Once | INTRAVENOUS | Status: AC | PRN
  Administered 2020-02-02: 17 mL via INTRAVENOUS

## 2020-02-06 ENCOUNTER — Telehealth: Payer: No Typology Code available for payment source | Admitting: Urology

## 2020-02-07 ENCOUNTER — Encounter: Payer: Self-pay | Admitting: Urology

## 2020-02-07 ENCOUNTER — Other Ambulatory Visit: Payer: Self-pay

## 2020-02-08 ENCOUNTER — Ambulatory Visit
Admission: RE | Admit: 2020-02-08 | Discharge: 2020-02-08 | Disposition: A | Discharge status: Home / Self Care | Payer: No Typology Code available for payment source | Source: Ambulatory Visit | Attending: Urology | Admitting: Urology

## 2020-02-08 ENCOUNTER — Other Ambulatory Visit: Payer: Self-pay | Admitting: *Deleted

## 2020-02-08 DIAGNOSIS — C50511 Malignant neoplasm of lower-outer quadrant of right female breast: Secondary | ICD-10-CM

## 2020-02-08 DIAGNOSIS — C7931 Secondary malignant neoplasm of brain: Secondary | ICD-10-CM

## 2020-02-08 NOTE — Progress Notes (Signed)
Radiation Oncology         [phone]) [phone] ________________________________  Name: Kaitlyn Keith MRN: [id number]  Date: 02/08/2020  DOB: [birth year]  Follow-Up Visit Note  CC: Patient, [name]  [name], MD  Diagnosis:    54 y.o. 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        ICD-10-CM   1. Malignant neoplasm of lower-outer quadrant of right female breast, unspecified estrogen receptor status ([facility])  C50.511   2. 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 Pelvis / 30 Gy in 10 fractions (right inguinal nodes) 2. Left Supraclavicular / 30 Gy in 10 fractions 3. Abdomen / 20 Gy in 8 fractions (subcutaneous Met)  07/11/2017 - 07/22/2017 palliative XRT for painful subcutaneous nodules ([place]) 1. Left Flank / 30 Gy in 10 fractions 2. Left Groin / 30 Gy in 10 fractions  07/26/16 Preop SRS Treatment Tammi Klippel): PTV1 Right Cerebellum was treated to 18 Gy in 1 fraction  07/23/15-09/05/15 ([place]): 50.4 Gy to the right breast + 10 Gy boost  Narrative:  I spoke with the patient to conduct her routine scheduled 6 month follow up visit to review recent MRI brain via telephone to spare the patient unnecessary potential exposure in the healthcare setting during the current COVID-19 pandemic.  The patient was notified in advance and gave permission to proceed with this visit format.  Kaitlyn Keith is a pleasant 54 y.o. 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 She was diagnosed with her cancer in July 2016, and underwent neoadjuvant chemotherapy followed by right lumpectomy and adjuvant radiation. She was found to have recurrent  disease in February 2018 with a right cerebellar mass, and underwent preoperative SRS treatment followed by surgical resection on 07/29/2016.   She developed multiple subcutaneous nodules in the neck, upper back, left arm, abdomen and pelvis, noted on PET scan from 11/17/16. She was started on Everolimus on 12/31/16 but was discontinued 04/11/17 due to disease progression with interval development of subcutaneous nodules in the ventral lower left pelvic wall and left flank and interval growth of 3 scattered peritoneal metastases. She started Xeloda in 05/2017 but discontinued due to disease progression on re-staging scans from 06/27/17 which showed a mixed response to therapy with some regression of the previously noted intraperitoneal implants but other lesions with significant interval growth including subcutaneous nodules in the left flank, left vulvar region, right inguinal LAN and anterior mediastinal LAN. She elected to move forward with palliative XRT to 2 painful subcutaneous lesions in the left flank and left pubic/vulvar region which was completed in March 2019.  She tolerated radiotherapy very well and had an excellent response with decreased size of both treated lesions.  Her systemic chemotherapy was switched to [facility] [place] but this was discontinued in October 2019 due to evidence of disease progression on follow-up CT C/A/P from 02/02/2018 indicating continued progression of right inguinal lymph node and right inguinal lymphadenopathy.  She underwent a CT-guided biopsy of the right inguinal lymph node on 03/02/2018 with final pathology confirming metastatic, poorly differentiated carcinoma, ER/PR negative and HER-2 negative.  Her systemic therapy was changed to pembrolizumab immunotherapy at  that time and she was also referred back to radiation oncology for consideration of palliative radiotherapy to the painful, progressive right inguinal lymphadenopathy.  At the time of her consult on 03/15/2018, she also  mentioned that she had recently developed pain in the left supraclavicular region as well as in the left flank area.  She elected to proceed with palliative radiotherapy to the 3 sites of painful metastatic disease in the right inguinal, left supraclavicular and left abdomen/flank and this was completed on 03/31/2018.  She tolerated the radiation well and did have significant improvement in her pain.  She has now completed 28 cycles of Keytruda (pembrolizumab) and her most recent CT C/A/P from 12/14/19 showed complete resolution of metastatic focus in the right inguinal/groin region as well as complete resolution of the left supraclavicular lymphadenopathy. Stable 6 mm hypodense lesion in the liver. No current evidence of active malignancy in the chest, abdomen or pelvis. She has continued in routine follow up with Dr. Lindi Adie, last seen on 01/22/20 and the recommendation is to continue with her current immunotherapy with [facility] every 3 weeks with therapy to be completed 03/17/2020, after the 30th cycle.  She has also continued being followed in our multidisciplinary brain tumor conference, and her last MRI scan on 02/02/20 was recently reviewed in brain tumor board on Monday 02/04/20 and revealed a stable 3 mm enhancing nodule in the right lateral cerebellum at the site of previous tumor treatment, felt to be consistent with treatment related changes especially in light of its stable appearance over multiple scans now. Highly unlikely to be disease recurrence. There were no other enhancing lesions. Today's visit is to review these findings.  On review of systems, the patient reports that she is doing well overall. She denies any chest pain, shortness of breath, cough, fevers, chills, night sweats, or unintended weight changes. She denies any bowel or bladder disturbances, and denies abdominal pain, nausea or vomiting. She denies new visual or auditory changes, dizziness, imbalance, tremors or seizure activity. She  has continued having occasional headaches which respond to tylenol or advil if needed.  The headaches became severe back in 10/2018 and she ended up having a wisdom tooth extraction with significant improvement of her HAs. She has some chronic pain in the left groin/hip joint region that radiates down her left leg anteriorly to her foot.  This pain is exacerbated with activities and improves with rest and does not wake her from sleep at night. An MRI of the hip in October 2020 was without evidence of bony metastatic disease or acute abnormalities.  She reports mild improvement with taking muscle relaxants prn. She denies any new skin lesions or concerns. A complete review of systems is obtained and is otherwise negative.  Past Medical History:  Past Medical History:  Diagnosis Date  . Anxiety   . Arthritis   . Back pain   . Brain cancer ([place])    brian met from triple negative breast ca  . Breast cancer ([place])   . Breast cancer of lower-outer quadrant of right female breast ([place]) 12/05/2014  . Depression   . FH: chemotherapy 12/2014-04/2015  . Genetic testing 12/10/2016   Kaitlyn Keith underwent genetic counseling and testing for hereditary cancer syndromes on 11/18/2016. 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 Mutations in MSH6 are associated with a hereditary cancer syndrome called Lynch syndrome. For more detailed discussion, please see genetic counseling documentation from 12/10/2016.  Testing was perfo  .  Headache    due to brain cancer, no longer having them  . Hot flashes   . Hypertension   . 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 Mutations in MSH6 are associated with a hereditary cancer syndrome called Lynch syndrome. For more  detailed discussion, please see genetic counseling documentation from 12/10/2016.  Testing was perfo  . 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 Radiation 07/23/15-09/05/15   right breast 50.4 Gy, boost of 10 Gy    Past Surgical History: Past Surgical History:  Procedure Laterality Date  . APPLICATION OF CRANIAL NAVIGATION Right 07/27/2016   Procedure: APPLICATION OF CRANIAL NAVIGATION;  Surgeon: Kevan Ny Ditty, MD;  Location: [place];  Service: Neurosurgery;  Laterality: Right;  . BIOPSY OF SKIN SUBCUTANEOUS TISSUE AND/OR MUCOUS MEMBRANE Left 12/24/2016   Procedure: OPEN BIOPSY LESIONS ON LEFT LOWER BACK AND SHOULDER BLADE;  Surgeon: [name], MD;  Location: [place];  Service: General;  Laterality: Left;  . BREAST LUMPECTOMY WITH NEEDLE LOCALIZATION AND AXILLARY SENTINEL LYMPH NODE BX Right 06/16/2015   Procedure: BREAST LUMPECTOMY WITH NEEDLE LOCALIZATION AND AXILLARY SENTINEL LYMPH NODE BX;  Surgeon: [name], MD;  Location: [place];  Service: General;  Laterality: Right;  . BREAST REDUCTION SURGERY    . CRANIOTOMY Right 07/27/2016   Procedure: Right Suboccipital craniotomy for tumor resection with stereotactic navigation;  Surgeon: Kevan Ny Ditty, MD;  Location: [place];  Service: Neurosurgery;  Laterality: Right;  . PORT-A-CATH REMOVAL N/A 06/16/2015   Procedure: REMOVAL PORT-A-CATH;  Surgeon: [name], MD;  Location: [place];  Service: General;  Laterality: N/A;  . PORTACATH PLACEMENT N/A 12/23/2014   Procedure: INSERTION PORT-A-CATH;  Surgeon: [name], MD;  Location: [place];  Service: General;  Laterality: N/A;  . PORTACATH PLACEMENT Left 07/08/2017   Procedure: INSERTION PORT-A-CATH;  Surgeon: [name], MD;  Location: [facility];  Service: General;  Laterality: Left;    Social History:  Social History   Socioeconomic History  . Marital status: Divorced    Spouse  name: Not on file  . Number of children: 1  . Years of education: Not on file  . Highest education level: Not on file  Occupational History  . Not on file  Tobacco Use  . Smoking status: Never Smoker  . Smokeless tobacco: Never Used  Vaping Use  . Vaping Use: Never used  Substance and Sexual Activity  . Alcohol use: Yes    Comment: social  . Drug use: No  . Sexual activity: Yes  Other Topics Concern  . Not on file  Social History Narrative  . Not on file   Social Determinants of Health   Financial Resource Strain:   . Difficulty of Paying Living Expenses: Not on file  Food Insecurity:   . Worried About Charity fundraiser in the Last Year: Not on file  . Ran Out of Food in the Last Year: Not on file  Transportation Needs:   . Lack of Transportation (Medical): Not on file  . Lack of Transportation (Non-Medical): Not on file  Physical Activity:   . Days of Exercise per Week: Not on file  . Minutes of Exercise per Session: Not on file  Stress:   .  Feeling of Stress : Not on file  Social Connections:   . Frequency of Communication with Friends and Family: Not on file  . Frequency of Social Gatherings with Friends and Family: Not on file  . Attends Religious Services: Not on file  . Active Member of Clubs or Organizations: Not on file  . Attends Archivist Meetings: Not on file  . Marital Status: Not on file  Intimate Partner Violence:   . Fear of Current or Ex-Partner: Not on file  . Emotionally Abused: Not on file  . Physically Abused: Not on file  . Sexually Abused: Not on file  The patient is divorced. She has worked for [employer] firm, and continues working from home full time.  Family History: Family History  Problem Relation Age of Onset  . Aneurysm Mother 65       d.55  . Heart attack Father 55       d.62  . Endometrial cancer Sister 68  . Lung cancer Maternal Uncle        d.68s  . Cancer Maternal Grandmother        unspecified  type-possibly stomach d.88s                          ALLERGIES:  has No Known Allergies.  Meds: Current Outpatient Medications  Medication Sig Dispense Refill  . alprazolam (XANAX) 2 MG tablet Take 1 tablet (2 mg total) by mouth at bedtime. 30 tablet 5  . betamethasone valerate ointment (VALISONE) 0.1 % Apply 1 application topically 2 (two) times daily. 30 g 1  . cyclobenzaprine (FLEXERIL) 5 MG tablet Take 1 tablet (5 mg total) by mouth 3 (three) times daily as needed for muscle spasms. 30 tablet 0  . ipratropium (ATROVENT) 0.06 % nasal spray Place 2 sprays into both nostrils 4 (four) times daily.    Marland Kitchen levothyroxine (SYNTHROID) 125 MCG tablet TAKE 1 TABLET BY MOUTH EVERY DAY BEFORE BREAKFAST 90 tablet 0  . lidocaine-prilocaine (EMLA) cream APPLY TO AFFECTED AREA ONCE AS DIRECTED 30 g 3  . lisinopril-hydrochlorothiazide (ZESTORETIC) 20-12.5 MG tablet TAKE 1 TABLET BY MOUTH TWICE A DAY 180 tablet 3  . triamcinolone ointment (KENALOG) 0.5 % APPLY TO AFFECTED AREA TWICE A DAY (Patient not taking: Reported on 02/07/2020) 30 g 0   No current facility-administered medications for this encounter.    Physical Findings:   vitals were not taken for this visit.    Unable to assess due to telephone visit format.   Lab Findings: Lab Results  Component Value Date   WBC 4.1 01/22/2020   WBC 3.0 (L) 03/02/2018   HGB 11.9 (L) 01/22/2020   HGB 11.7 04/04/2017   HCT 36.3 01/22/2020   HCT 35.9 04/04/2017   PLT 186 01/22/2020   PLT 207 04/04/2017    Lab Results  Component Value Date   NA 140 01/22/2020   NA 139 04/04/2017   K 3.4 (L) 01/22/2020   K 3.7 04/04/2017   CHLORIDE 108 04/04/2017   CO2 27 01/22/2020   CO2 24 04/04/2017   GLUCOSE 79 01/22/2020   GLUCOSE 76 04/04/2017   BUN 12 01/22/2020   BUN 5.8 (L) 04/04/2017   CREATININE 0.67 01/22/2020   CREATININE 0.8 04/04/2017   BILITOT 0.5 01/22/2020   BILITOT 0.43 04/04/2017   ALKPHOS 84 01/22/2020   ALKPHOS 131 04/04/2017   AST  19 01/22/2020   AST 18 04/04/2017   ALT 11 01/22/2020  ALT 12 04/04/2017   PROT 6.7 01/22/2020   PROT 7.0 04/04/2017   ALBUMIN 3.4 (L) 01/22/2020   ALBUMIN 3.0 (L) 04/04/2017   CALCIUM 9.7 01/22/2020   CALCIUM 8.6 04/04/2017   ANIONGAP 6 01/22/2020    Radiographic Findings: MR Brain W Wo Contrast  Result Date: 02/03/2020 CLINICAL DATA:  CNS neoplasm. Surveillance of treated disease. Breast cancer with brain metastasis. EXAM: MRI HEAD WITHOUT AND WITH CONTRAST TECHNIQUE: Multiplanar, multiecho pulse sequences of the brain and surrounding structures were obtained without and with intravenous contrast. CONTRAST:  31m MULTIHANCE GADOBENATE DIMEGLUMINE 529 MG/ML IV SOLN COMPARISON:  MR head without and with contrast 07/21/2019 and 04/14/2019 FINDINGS: Brain: Posttreatment changes in the lateral inferior right cerebellum are again noted. Area of encephalomalacia is stable. Within the central portion of the region is a 4 mm enhancing nodule, stable over multiple MRIs. No new areas of enhancement or T2 signal change are present. Minimal subcortical changes in the frontal lobes bilaterally are stable. No acute infarct, hemorrhage, or mass lesion is present. The ventricles are of normal size. No significant extraaxial fluid collection is present. The internal auditory canals are within normal limits. The brainstem and cerebellum are otherwise unremarkable. No other pathologic enhancement is present. Vascular: Flow is present in the major intracranial arteries. Skull and upper cervical spine: Chronic anterolisthesis at C2-3 is again seen. Alignment is stable. Craniocervical junction is otherwise normal. Marrow signal is unremarkable. Sinuses/Orbits: The paranasal sinuses and mastoid air cells are clear. The globes and orbits are within normal limits. IMPRESSION: 1. Stable posttreatment encephalomalacia in the lateral inferior right cerebellum. 2. Stable 4 mm nodule of enhancement within the area of  encephalomalacia may represent a small varix. Given stability, malignancy is considered highly unlikely at this point. 3. No new or progressive disease. Electronically Signed   By: CSan MorelleM.D.   On: 02/03/2020 07:18    Impression/Plan: 1. 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  Her most recent MRI brain from 02/02/20 was recently reviewed with the multidisciplinary tumor board on Monday 02/04/20 and revealed stability of the 3 mm enhancing nodule in the right lateral cerebellum at the site of previous tumor treatment, felt to be consistent with treatment related changes, especially given the stable appearance over multiple scans now, hisghly unlikely to be disease recurrence. There were no other enhancing lesions. The recommendation is to continue with surveillance MRI every 6 months and a follow up visit thereafter to review results and recommendations from tumor board. Her most recent systemic imaging from 12/14/19 indicates a positive response to systemic treatment and recent palliative radiotherapy.  She met with her medical oncologist, Dr. GLindi Adiein follow-up on 01/22/20 and the recommendation is to continue with Keytruda q 3 weeks for systemic disease management to complete a total of 30 cycles and then discontinue at the end of October. She will have repeat CT scans for systemic disease restaging every 6 months under the care and direction of Dr. GLindi Adie She knows to call [place] any questions or concerns in the interim and is comfortable and in agreement with this plan.  I spent 25 minutes in telephone conversation with the patient and more than 50% of that time was spent in counseling and/or coordination of care.    ANicholos Johns PA-C

## 2020-02-10 NOTE — Progress Notes (Signed)
Patient Care Team: Patient, [name] as PCP - General (General Practice) [name], [name], [name], [name], [name], [name], NP (Inactive) as Nurse Practitioner (Nurse Practitioner)  DIAGNOSIS:    ICD-10-CM   1. 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, estrogen receptor negative ([place])  C50.511    Z17.1   2. Breast cancer of lower-outer quadrant of right female breast ([place])  C50.511 alprazolam (XANAX) 2 MG tablet    SUMMARY OF ONCOLOGIC HISTORY: Oncology History  Breast cancer of lower-outer quadrant of right female breast ([place])  12/03/2014 Mammogram   Right breast mass 1.9 cm it o'clock position 8 cm depth from the nipple   12/03/2014 Initial Diagnosis   Right breast biopsy 8:00: Invasive ductal carcinoma, grade 3, ER 0%, PR 0%, Ki-67 90%, HER-2 negative ratio 1.43   12/10/2014 Breast MRI   Right breast lower outer quadrant: 2.3 x 2.4 x 2.4 cm rim-enhancing mass abuts the pectoralis fascia but no enhancement of pectoralis muscle, second focus of artifact?'s second tissue marker clip, no lymph nodes   12/10/2014 Clinical Stage   Stage IIA: T2 N0   12/24/2014 - 04/29/2015 Neo-Adjuvant Chemotherapy   Dose dense Adriamycin and Cytoxan 4 followed by weekly Abraxane 12   05/02/2015 Breast MRI   complete radiologic response   06/16/2015 Surgery   Left Lumpectomy: Complete path Response, 0/2 LN   06/16/2015 Pathologic Stage   ypT0 ypN0   07/23/2015 - 09/05/2015 Radiation Therapy   Adjuvant RT: 50.4 Gy in 28 fractions and a boost of 10 Gy in 5 fractions to total dose of 60.4 Gy   10/24/2015 Survivorship   SCP mailed to patient in lieu of in person visit.   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 in BARD 1, BRCA2 and NF1   12/31/2016 Miscellaneous   Everolimus 10 mg daily for cycle 1 if she cannot tolerate will decrease to 7.5 mg daily   05/24/2017 - 07/04/2017 Chemotherapy   Xeloda 2000 mg 2 weeks on 1 week off stopped due to progression of disease based on CT scans done 06/27/2017   07/25/2017 - 03/13/2018 Chemotherapy   [place] days 1 and 8 every 3 weeks stopped for progression    03/20/2018 - 03/31/2018 Radiation Therapy   Radiation to lymph nodes   04/03/2018 -  Chemotherapy   Keytruda every 3 weeks      CHIEF COMPLIANT: Follow-up of metastatic breast cancer on Keytruda  INTERVAL HISTORY: Kaitlyn Keith is a 54 y.o. with above-mentioned history of metastatic breast cancer currently on treatment with Keytruda. She presents to the  clinic today for treatment.  She reports she reports muscle spasms and aches and pains which respond to Robaxin.  ALLERGIES:  has No Known Allergies.  MEDICATIONS:  Current Outpatient Medications  Medication Sig Dispense Refill  . alprazolam (XANAX) 2 MG tablet Take 1 tablet (2 mg total) by mouth at bedtime. 30 tablet 3  . betamethasone valerate ointment (VALISONE) 0.1 % Apply 1  application topically 2 (two) times daily. 30 g 1  . cyclobenzaprine (FLEXERIL) 5 MG tablet Take 1 tablet (5 mg total) by mouth 3 (three) times daily as needed for muscle spasms. 30 tablet 0  . ipratropium (ATROVENT) 0.06 % nasal spray Place 2 sprays into both nostrils 4 (four) times daily.    Marland Kitchen levothyroxine (SYNTHROID) 125 MCG tablet TAKE 1 TABLET BY MOUTH EVERY DAY BEFORE BREAKFAST 90 tablet 0  . lidocaine-prilocaine (EMLA) cream APPLY TO AFFECTED AREA ONCE AS DIRECTED 30 g 3  . lisinopril-hydrochlorothiazide (ZESTORETIC) 20-12.5 MG tablet TAKE 1 TABLET BY MOUTH TWICE A DAY 180 tablet 3  . triamcinolone ointment (KENALOG) 0.5 % APPLY TO AFFECTED AREA TWICE A DAY (Patient not taking: Reported on 02/07/2020) 30 g 0   No current facility-administered medications for this visit.    PHYSICAL EXAMINATION: ECOG PERFORMANCE STATUS: 1 - Symptomatic but completely ambulatory  Vitals:   02/11/20 1247  BP: 120/85  Pulse: (!) 59  Resp: 18  Temp: (!) 97.1 F (36.2 C)  SpO2: 98%   Filed Weights   02/11/20 1247  Weight: 173 lb 6.4 oz (78.7 kg)     LABORATORY DATA:  I have reviewed the data as listed CMP Latest Ref Rng & Units 02/11/2020 01/22/2020 12/26/2019  Glucose 70 - 99 mg/dL 109(H) 79 97  BUN 6 - 20 mg/dL _0 Creatinine 0.44 - 1.00 mg/dL 0.76 0.67 0.78  Sodium 135 - 145 mmol/L 139 140 142  Potassium 3.5 - 5.1 mmol/L 3.5 3.4(L) 3.7  Chloride 98 - 111 mmol/L 106 107 107  CO2 22 - 32 mmol/L _1 Calcium 8.9 - 10.3 mg/dL 8.8(L) 9.7 9.9  Total Protein 6.5 - 8.1 g/dL 7.1 6.7 7.3  Total Bilirubin 0.3 - 1.2 mg/dL 0.5 0.5 0.7  Alkaline Phos 38 - 126 U/L 97 84 93  AST 15 - 41 U/L _2 ALT 0 - 44 U/L _3 Lab Results  Component Value Date   WBC 4.6 02/11/2020   HGB 12.8 02/11/2020   HCT 38.9 02/11/2020   MCV 87.8 02/11/2020   PLT 170 02/11/2020   NEUTROABS 2.9 02/11/2020    ASSESSMENT & PLAN:  Breast cancer of lower-outer quadrant of right female breast  ([place]) Right breast biopsy 12/03/2014 8:00: Invasive ductal carcinoma, grade 3, ER 0%, PR 0%, 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 Neoadj chemo 12/24/14- 04/29/15 AC x 4 foll by Abraxane X 12 Rt Lumpectomy: Path CR 0/2 LN Adj XRT 07/23/15- 09/08/15 PET/CT scan 11/17/2016:Subcutaneous nodules in the neck, upper back, left arm, abdomen and pelvis Patient progressed on Xeloda January 2019-07/04/2017 stopped due to progression of disease Cerebellar mass diagnosed 07/12/2016: Resection followed by stereotactic radiation 07/29/2016 Lymph node from 03/02/2018 biopsy: PDL1+ Pembrolizumab completed 01/22/2020 ------------------------------------------------------------------------------------------------------------------------------------------------ Current treatment: Pembrolizumab to be completed end of October 2021. Pembrolizumab toxicities: Hypothyroidism: On Synthroid 125 mcg daily MRI brain 02/02/2020: Stable  posttreatment changes.  We will perform CT chest abdomen pelvis prior end of October Renew her prescription for Robaxin and Xanax.  No orders of the defined types were placed in this encounter.  The patient has a good understanding of the overall plan. she agrees with it. she will call with any problems that may develop before the next visit here.  Total time spent: 30 mins including face to face time and time spent for planning, charting and coordination of care  [name], MD 02/11/2020  I, Cloyde Reams Dorshimer, am acting as scribe for Dr. [name].  I have reviewed the above documentation for accuracy and completeness, and I agree with the above.

## 2020-02-11 ENCOUNTER — Inpatient Hospital Stay: Payer: No Typology Code available for payment source

## 2020-02-11 ENCOUNTER — Ambulatory Visit: Payer: No Typology Code available for payment source | Admitting: Hematology and Oncology

## 2020-02-11 ENCOUNTER — Other Ambulatory Visit: Payer: No Typology Code available for payment source

## 2020-02-11 ENCOUNTER — Ambulatory Visit: Payer: No Typology Code available for payment source

## 2020-02-11 ENCOUNTER — Other Ambulatory Visit: Payer: Self-pay

## 2020-02-11 ENCOUNTER — Inpatient Hospital Stay (HOSPITAL_BASED_OUTPATIENT_CLINIC_OR_DEPARTMENT_OTHER): Payer: No Typology Code available for payment source | Admitting: Hematology and Oncology

## 2020-02-11 ENCOUNTER — Inpatient Hospital Stay: Payer: No Typology Code available for payment source | Attending: Hematology and Oncology

## 2020-02-11 VITALS — BP 120/85 | HR 59 | Temp 97.1°F | Resp 18 | Ht 63.0 in | Wt 173.4 lb

## 2020-02-11 DIAGNOSIS — Z171 Estrogen receptor negative status [ER-]: Secondary | ICD-10-CM

## 2020-02-11 DIAGNOSIS — M62838 Other muscle spasm: Secondary | ICD-10-CM | POA: Diagnosis not present

## 2020-02-11 DIAGNOSIS — C7931 Secondary malignant neoplasm of brain: Secondary | ICD-10-CM | POA: Diagnosis not present

## 2020-02-11 DIAGNOSIS — Z923 Personal history of irradiation: Secondary | ICD-10-CM | POA: Diagnosis not present

## 2020-02-11 DIAGNOSIS — Z5112 Encounter for antineoplastic immunotherapy: Secondary | ICD-10-CM | POA: Diagnosis not present

## 2020-02-11 DIAGNOSIS — C50511 Malignant neoplasm of lower-outer quadrant of right female breast: Secondary | ICD-10-CM | POA: Insufficient documentation

## 2020-02-11 DIAGNOSIS — C792 Secondary malignant neoplasm of skin: Secondary | ICD-10-CM | POA: Diagnosis not present

## 2020-02-11 DIAGNOSIS — Z79899 Other long term (current) drug therapy: Secondary | ICD-10-CM | POA: Diagnosis not present

## 2020-02-11 LAB — CBC WITH DIFFERENTIAL (CANCER CENTER ONLY)
Abs Immature Granulocytes: 0.01 10*3/uL (ref 0.00–0.07)
Basophils Absolute: 0 10*3/uL (ref 0.0–0.1)
Basophils Relative: 0 %
Eosinophils Absolute: 0.1 10*3/uL (ref 0.0–0.5)
Eosinophils Relative: 1 %
HCT: 38.9 % (ref 36.0–46.0)
Hemoglobin: 12.8 g/dL (ref 12.0–15.0)
Immature Granulocytes: 0 %
Lymphocytes Relative: 28 %
Lymphs Abs: 1.3 10*3/uL (ref 0.7–4.0)
MCH: 28.9 pg (ref 26.0–34.0)
MCHC: 32.9 g/dL (ref 30.0–36.0)
MCV: 87.8 fL (ref 80.0–100.0)
Monocytes Absolute: 0.4 10*3/uL (ref 0.1–1.0)
Monocytes Relative: 8 %
Neutro Abs: 2.9 10*3/uL (ref 1.7–7.7)
Neutrophils Relative %: 63 %
Platelet Count: 170 10*3/uL (ref 150–400)
RBC: 4.43 MIL/uL (ref 3.87–5.11)
RDW: 13.2 % (ref 11.5–15.5)
WBC Count: 4.6 10*3/uL (ref 4.0–10.5)
nRBC: 0 % (ref 0.0–0.2)

## 2020-02-11 LAB — TSH: TSH: 1.172 u[IU]/mL (ref 0.308–3.960)

## 2020-02-11 LAB — CMP (CANCER CENTER ONLY)
ALT: 11 U/L (ref 0–44)
AST: 16 U/L (ref 15–41)
Albumin: 3.5 g/dL (ref 3.5–5.0)
Alkaline Phosphatase: 97 U/L (ref 38–126)
Anion gap: 8 (ref 5–15)
BUN: 14 mg/dL (ref 6–20)
CO2: 25 mmol/L (ref 22–32)
Calcium: 8.8 mg/dL — ABNORMAL LOW (ref 8.9–10.3)
Chloride: 106 mmol/L (ref 98–111)
Creatinine: 0.76 mg/dL (ref 0.44–1.00)
GFR, Est AFR Am: >60 mL/min (ref >60)
GFR, Estimated: >60 mL/min (ref >60)
Glucose, Bld: 109 mg/dL — ABNORMAL HIGH (ref 70–99)
Potassium: 3.5 mmol/L (ref 3.5–5.1)
Sodium: 139 mmol/L (ref 135–145)
Total Bilirubin: 0.5 mg/dL (ref 0.3–1.2)
Total Protein: 7.1 g/dL (ref 6.5–8.1)

## 2020-02-11 MED ORDER — SODIUM CHLORIDE 0.9 % IV SOLN
200.0000 mg | Freq: Once | INTRAVENOUS | Status: AC
  Administered 2020-02-11: 200 mg via INTRAVENOUS
  Filled 2020-02-11: qty 8

## 2020-02-11 MED ORDER — SODIUM CHLORIDE 0.9% FLUSH
10.0000 mL | INTRAVENOUS | Status: DC | PRN
  Administered 2020-02-11: 10 mL
  Filled 2020-02-11: qty 10

## 2020-02-11 MED ORDER — CYCLOBENZAPRINE HCL 5 MG PO TABS: 5.0000 mg | ORAL_TABLET | Freq: Three times a day (TID) | ORAL | 0 refills | Status: DC | PRN

## 2020-02-11 MED ORDER — HEPARIN SOD (PORK) LOCK FLUSH 100 UNIT/ML IV SOLN
500.0000 [IU] | Freq: Once | INTRAVENOUS | Status: AC | PRN
  Administered 2020-02-11: 500 [IU]
  Filled 2020-02-11: qty 5

## 2020-02-11 MED ORDER — ALPRAZOLAM 2 MG PO TABS: 2.0000 mg | ORAL_TABLET | Freq: Every day | ORAL | 3 refills | Status: DC

## 2020-02-11 MED ORDER — SODIUM CHLORIDE 0.9 % IV SOLN
Freq: Once | INTRAVENOUS | Status: AC
  Filled 2020-02-11: qty 250

## 2020-02-11 NOTE — Patient Instructions (Signed)

## 2020-02-11 NOTE — Assessment & Plan Note (Signed)
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 Neoadj chemo 12/24/14- 04/29/15 AC x 4 foll by Abraxane X 12 Rt Lumpectomy: Path CR 0/2 LN Adj XRT 07/23/15- 09/08/15 PET/CT scan 11/17/2016:Subcutaneous nodules in the neck, upper back, left arm, abdomen and pelvis Patient progressed on Xeloda January 2019-07/04/2017 stopped due to progression of disease Cerebellar mass diagnosed 07/12/2016: Resection followed by stereotactic radiation 07/29/2016 Lymph node from 03/02/2018 biopsy: PDL1+ Pembrolizumab completed 01/22/2020 ------------------------------------------------------------------------------------------------------------------------------------------------   Pembrolizumab toxicities: Hypothyroidism: On Synthroid 125 mcg daily MRI brain 02/02/2020: Stable posttreatment changes.  We will watch and monitor her without any treatment and every 74-monthscan.

## 2020-02-11 NOTE — Patient Instructions (Signed)
[facility] Discharge Instructions for Patients Receiving Chemotherapy  Today you received the following chemotherapy agents:  Keytruda.  To help prevent nausea and vomiting after your treatment, we encourage you to take your nausea medication as directed.   If you develop nausea and vomiting that is not controlled by your nausea medication, call the clinic.   BELOW ARE SYMPTOMS THAT SHOULD BE REPORTED IMMEDIATELY:  *FEVER GREATER THAN 100.5 F  *CHILLS WITH OR WITHOUT FEVER  NAUSEA AND VOMITING THAT IS NOT CONTROLLED WITH YOUR NAUSEA MEDICATION  *UNUSUAL SHORTNESS OF BREATH  *UNUSUAL BRUISING OR BLEEDING  TENDERNESS IN MOUTH AND THROAT WITH OR WITHOUT PRESENCE OF ULCERS  *URINARY PROBLEMS  *BOWEL PROBLEMS  UNUSUAL RASH Items with * indicate a potential emergency and should be followed up as soon as possible.  Feel free to call the clinic should you have any questions or concerns. The clinic phone number is [phone].  Please show the CHEMO ALERT CARD at check-in to the Emergency Department and triage nurse.    

## 2020-02-12 ENCOUNTER — Telehealth: Payer: Self-pay | Admitting: Hematology and Oncology

## 2020-02-12 NOTE — Telephone Encounter (Signed)
No 9/20 los, no changes made to pt schedule

## 2020-02-29 ENCOUNTER — Inpatient Hospital Stay: Payer: No Typology Code available for payment source

## 2020-02-29 ENCOUNTER — Other Ambulatory Visit: Payer: Self-pay | Admitting: *Deleted

## 2020-02-29 ENCOUNTER — Other Ambulatory Visit: Payer: Self-pay

## 2020-02-29 ENCOUNTER — Other Ambulatory Visit: Payer: Self-pay | Admitting: Oncology

## 2020-02-29 ENCOUNTER — Inpatient Hospital Stay: Payer: No Typology Code available for payment source | Attending: Hematology and Oncology

## 2020-02-29 VITALS — BP 140/83 | HR 73 | Temp 98.1°F | Resp 20

## 2020-02-29 DIAGNOSIS — Z5112 Encounter for antineoplastic immunotherapy: Secondary | ICD-10-CM | POA: Diagnosis not present

## 2020-02-29 DIAGNOSIS — Z171 Estrogen receptor negative status [ER-]: Secondary | ICD-10-CM

## 2020-02-29 DIAGNOSIS — C50511 Malignant neoplasm of lower-outer quadrant of right female breast: Secondary | ICD-10-CM | POA: Diagnosis present

## 2020-02-29 LAB — CMP (CANCER CENTER ONLY)
ALT: 13 U/L (ref 0–44)
AST: 17 U/L (ref 15–41)
Albumin: 3.9 g/dL (ref 3.5–5.0)
Alkaline Phosphatase: 92 U/L (ref 38–126)
Anion gap: 12 (ref 5–15)
BUN: 10 mg/dL (ref 6–20)
CO2: 27 mmol/L (ref 22–32)
Calcium: 8.7 mg/dL — ABNORMAL LOW (ref 8.9–10.3)
Chloride: 100 mmol/L (ref 98–111)
Creatinine: 0.79 mg/dL (ref 0.44–1.00)
GFR, Estimated: >60 mL/min (ref >60)
Glucose, Bld: 100 mg/dL — ABNORMAL HIGH (ref 70–99)
Potassium: 3.4 mmol/L — ABNORMAL LOW (ref 3.5–5.1)
Sodium: 139 mmol/L (ref 135–145)
Total Bilirubin: 0.7 mg/dL (ref 0.3–1.2)
Total Protein: 7.2 g/dL (ref 6.5–8.1)

## 2020-02-29 LAB — CBC WITH DIFFERENTIAL (CANCER CENTER ONLY)
Abs Immature Granulocytes: 0.01 10*3/uL (ref 0.00–0.07)
Basophils Absolute: 0 10*3/uL (ref 0.0–0.1)
Basophils Relative: 0 %
Eosinophils Absolute: 0.1 10*3/uL (ref 0.0–0.5)
Eosinophils Relative: 1 %
HCT: 38.9 % (ref 36.0–46.0)
Hemoglobin: 12.9 g/dL (ref 12.0–15.0)
Immature Granulocytes: 0 %
Lymphocytes Relative: 26 %
Lymphs Abs: 1.1 10*3/uL (ref 0.7–4.0)
MCH: 28.9 pg (ref 26.0–34.0)
MCHC: 33.2 g/dL (ref 30.0–36.0)
MCV: 87.2 fL (ref 80.0–100.0)
Monocytes Absolute: 0.3 10*3/uL (ref 0.1–1.0)
Monocytes Relative: 8 %
Neutro Abs: 2.8 10*3/uL (ref 1.7–7.7)
Neutrophils Relative %: 65 %
Platelet Count: 195 10*3/uL (ref 150–400)
RBC: 4.46 MIL/uL (ref 3.87–5.11)
RDW: 12.5 % (ref 11.5–15.5)
WBC Count: 4.3 10*3/uL (ref 4.0–10.5)
nRBC: 0 % (ref 0.0–0.2)

## 2020-02-29 LAB — TSH: TSH: 0.513 u[IU]/mL (ref 0.350–4.500)

## 2020-02-29 MED ORDER — HEPARIN SOD (PORK) LOCK FLUSH 100 UNIT/ML IV SOLN
500.0000 [IU] | Freq: Once | INTRAVENOUS | Status: AC | PRN
  Administered 2020-02-29: 500 [IU]
  Filled 2020-02-29: qty 5

## 2020-02-29 MED ORDER — SODIUM CHLORIDE 0.9 % IV SOLN
Freq: Once | INTRAVENOUS | Status: AC
  Filled 2020-02-29: qty 250

## 2020-02-29 MED ORDER — SODIUM CHLORIDE 0.9% FLUSH
10.0000 mL | INTRAVENOUS | Status: DC | PRN
  Administered 2020-02-29: 10 mL
  Filled 2020-02-29: qty 10

## 2020-02-29 MED ORDER — SODIUM CHLORIDE 0.9 % IV SOLN
200.0000 mg | Freq: Once | INTRAVENOUS | Status: AC
  Administered 2020-02-29: 200 mg via INTRAVENOUS
  Filled 2020-02-29: qty 8

## 2020-02-29 NOTE — Progress Notes (Signed)
After today's tx, no blood return noted from port.  Pt placed in various positions with no success.  Shelia Media PA informed, his recommendation was to forego cathflo this evening & to inform Dr. Lindi Adie to see if he wants to bring patient back in this coming week for port assessment & possibly cathflo.  Dr. Lindi Adie informed of situation by staff message.

## 2020-02-29 NOTE — Patient Instructions (Signed)
[facility] Discharge Instructions for Patients Receiving Chemotherapy  Today you received the following chemotherapy agents:  Keytruda.  To help prevent nausea and vomiting after your treatment, we encourage you to take your nausea medication as directed.   If you develop nausea and vomiting that is not controlled by your nausea medication, call the clinic.   BELOW ARE SYMPTOMS THAT SHOULD BE REPORTED IMMEDIATELY:  *FEVER GREATER THAN 100.5 F  *CHILLS WITH OR WITHOUT FEVER  NAUSEA AND VOMITING THAT IS NOT CONTROLLED WITH YOUR NAUSEA MEDICATION  *UNUSUAL SHORTNESS OF BREATH  *UNUSUAL BRUISING OR BLEEDING  TENDERNESS IN MOUTH AND THROAT WITH OR WITHOUT PRESENCE OF ULCERS  *URINARY PROBLEMS  *BOWEL PROBLEMS  UNUSUAL RASH Items with * indicate a potential emergency and should be followed up as soon as possible.  Feel free to call the clinic should you have any questions or concerns. The clinic phone number is [phone].  Please show the CHEMO ALERT CARD at check-in to the Emergency Department and triage nurse.    

## 2020-03-14 ENCOUNTER — Other Ambulatory Visit: Payer: Self-pay

## 2020-03-14 ENCOUNTER — Ambulatory Visit (HOSPITAL_COMMUNITY)
Admission: RE | Admit: 2020-03-14 | Discharge: 2020-03-14 | Disposition: A | Discharge status: Home / Self Care | Payer: No Typology Code available for payment source | Source: Ambulatory Visit | Attending: Hematology and Oncology | Admitting: Hematology and Oncology

## 2020-03-14 ENCOUNTER — Encounter (HOSPITAL_COMMUNITY): Payer: Self-pay

## 2020-03-14 DIAGNOSIS — Z171 Estrogen receptor negative status [ER-]: Secondary | ICD-10-CM | POA: Diagnosis present

## 2020-03-14 DIAGNOSIS — C50511 Malignant neoplasm of lower-outer quadrant of right female breast: Secondary | ICD-10-CM | POA: Insufficient documentation

## 2020-03-14 DIAGNOSIS — C792 Secondary malignant neoplasm of skin: Secondary | ICD-10-CM | POA: Insufficient documentation

## 2020-03-14 MED ORDER — IOHEXOL 300 MG/ML  SOLN
100.0000 mL | Freq: Once | INTRAMUSCULAR | Status: AC | PRN
  Administered 2020-03-14: 100 mL via INTRAVENOUS

## 2020-03-17 ENCOUNTER — Inpatient Hospital Stay: Payer: No Typology Code available for payment source

## 2020-03-17 ENCOUNTER — Other Ambulatory Visit: Payer: No Typology Code available for payment source

## 2020-03-17 ENCOUNTER — Inpatient Hospital Stay: Payer: No Typology Code available for payment source | Admitting: Hematology and Oncology

## 2020-03-17 ENCOUNTER — Other Ambulatory Visit: Payer: Self-pay

## 2020-03-17 ENCOUNTER — Other Ambulatory Visit: Payer: Self-pay | Admitting: *Deleted

## 2020-03-17 ENCOUNTER — Ambulatory Visit: Payer: No Typology Code available for payment source | Admitting: Hematology and Oncology

## 2020-03-17 ENCOUNTER — Ambulatory Visit: Payer: No Typology Code available for payment source

## 2020-03-17 VITALS — BP 145/99 | HR 61 | Temp 98.0°F | Resp 20

## 2020-03-17 DIAGNOSIS — Z5112 Encounter for antineoplastic immunotherapy: Secondary | ICD-10-CM | POA: Diagnosis not present

## 2020-03-17 DIAGNOSIS — C50511 Malignant neoplasm of lower-outer quadrant of right female breast: Secondary | ICD-10-CM

## 2020-03-17 DIAGNOSIS — C792 Secondary malignant neoplasm of skin: Secondary | ICD-10-CM

## 2020-03-17 LAB — CBC WITH DIFFERENTIAL (CANCER CENTER ONLY)
Abs Immature Granulocytes: 0.01 10*3/uL (ref 0.00–0.07)
Basophils Absolute: 0 10*3/uL (ref 0.0–0.1)
Basophils Relative: 1 %
Eosinophils Absolute: 0.1 10*3/uL (ref 0.0–0.5)
Eosinophils Relative: 2 %
HCT: 39.2 % (ref 36.0–46.0)
Hemoglobin: 12.9 g/dL (ref 12.0–15.0)
Immature Granulocytes: 0 %
Lymphocytes Relative: 26 %
Lymphs Abs: 1.2 10*3/uL (ref 0.7–4.0)
MCH: 28.5 pg (ref 26.0–34.0)
MCHC: 32.9 g/dL (ref 30.0–36.0)
MCV: 86.5 fL (ref 80.0–100.0)
Monocytes Absolute: 0.4 10*3/uL (ref 0.1–1.0)
Monocytes Relative: 9 %
Neutro Abs: 2.9 10*3/uL (ref 1.7–7.7)
Neutrophils Relative %: 62 %
Platelet Count: 180 10*3/uL (ref 150–400)
RBC: 4.53 MIL/uL (ref 3.87–5.11)
RDW: 12.7 % (ref 11.5–15.5)
WBC Count: 4.7 10*3/uL (ref 4.0–10.5)
nRBC: 0 % (ref 0.0–0.2)

## 2020-03-17 LAB — CMP (CANCER CENTER ONLY)
ALT: 12 U/L (ref 0–44)
AST: 18 U/L (ref 15–41)
Albumin: 3.8 g/dL (ref 3.5–5.0)
Alkaline Phosphatase: 103 U/L (ref 38–126)
Anion gap: 8 (ref 5–15)
BUN: 12 mg/dL (ref 6–20)
CO2: 27 mmol/L (ref 22–32)
Calcium: 9.3 mg/dL (ref 8.9–10.3)
Chloride: 105 mmol/L (ref 98–111)
Creatinine: 0.7 mg/dL (ref 0.44–1.00)
GFR, Estimated: >60 mL/min (ref >60)
Glucose, Bld: 89 mg/dL (ref 70–99)
Potassium: 3.4 mmol/L — ABNORMAL LOW (ref 3.5–5.1)
Sodium: 140 mmol/L (ref 135–145)
Total Bilirubin: 0.7 mg/dL (ref 0.3–1.2)
Total Protein: 7.4 g/dL (ref 6.5–8.1)

## 2020-03-17 LAB — TSH: TSH: 0.398 u[IU]/mL (ref 0.308–3.960)

## 2020-03-17 MED ORDER — SODIUM CHLORIDE 0.9 % IV SOLN
Freq: Once | INTRAVENOUS | Status: AC
  Filled 2020-03-17: qty 250

## 2020-03-17 MED ORDER — SODIUM CHLORIDE 0.9% FLUSH
10.0000 mL | INTRAVENOUS | Status: DC | PRN
  Administered 2020-03-17: 10 mL
  Filled 2020-03-17: qty 10

## 2020-03-17 MED ORDER — SODIUM CHLORIDE 0.9 % IV SOLN
200.0000 mg | Freq: Once | INTRAVENOUS | Status: AC
  Administered 2020-03-17: 200 mg via INTRAVENOUS
  Filled 2020-03-17: qty 8

## 2020-03-17 MED ORDER — HEPARIN SOD (PORK) LOCK FLUSH 100 UNIT/ML IV SOLN
500.0000 [IU] | Freq: Once | INTRAVENOUS | Status: AC | PRN
  Administered 2020-03-17: 500 [IU]
  Filled 2020-03-17: qty 5

## 2020-03-17 NOTE — Assessment & Plan Note (Deleted)
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 Neoadj chemo 12/24/14- 04/29/15 AC x 4 foll by Abraxane X 12 Rt Lumpectomy: Path CR 0/2 LN Adj XRT 07/23/15- 09/08/15 PET/CT scan 11/17/2016:Subcutaneous nodules in the neck, upper back, left arm, abdomen and pelvis Patient progressed on Xeloda January 2019-07/04/2017 stopped due to progression of disease Cerebellar mass diagnosed 07/12/2016: Resection followed by stereotactic radiation 07/29/2016 Lymph node from 03/02/2018 biopsy: PDL1+ Pembrolizumab completed 01/22/2020 ------------------------------------------------------------------------------------------------------------------------------------------------ Current treatment: Pembrolizumab completed 03/17/2020 Today is her last treatment. Pembrolizumab toxicities: Hypothyroidism: On Synthroid 125 mcg daily MRI brain 02/02/2020: Stable posttreatment changes.  CT CAP 03/14/2020: No evidence of breast cancer recurrence or metastases. She will get her port removed

## 2020-03-17 NOTE — Patient Instructions (Signed)
[facility] Discharge Instructions for Patients Receiving Chemotherapy  Today you received the following chemotherapy agents Pembrolizumab (KEYTRUDA).  To help prevent nausea and vomiting after your treatment, we encourage you to take your nausea medication as prescribed.   If you develop nausea and vomiting that is not controlled by your nausea medication, call the clinic.   BELOW ARE SYMPTOMS THAT SHOULD BE REPORTED IMMEDIATELY:  *FEVER GREATER THAN 100.5 F  *CHILLS WITH OR WITHOUT FEVER  NAUSEA AND VOMITING THAT IS NOT CONTROLLED WITH YOUR NAUSEA MEDICATION  *UNUSUAL SHORTNESS OF BREATH  *UNUSUAL BRUISING OR BLEEDING  TENDERNESS IN MOUTH AND THROAT WITH OR WITHOUT PRESENCE OF ULCERS  *URINARY PROBLEMS  *BOWEL PROBLEMS  UNUSUAL RASH Items with * indicate a potential emergency and should be followed up as soon as possible.  Feel free to call the clinic should you have any questions or concerns. The clinic phone number is [phone].  Please show the CHEMO ALERT CARD at check-in to the Emergency Department and triage nurse.   

## 2020-03-19 ENCOUNTER — Telehealth: Payer: Self-pay | Admitting: Hematology and Oncology

## 2020-03-19 NOTE — Telephone Encounter (Signed)
Scheduled appt per 10/25 sch msg - pt aware of appt date and time

## 2020-03-29 ENCOUNTER — Ambulatory Visit: Payer: Self-pay | Admitting: General Surgery

## 2020-04-09 ENCOUNTER — Other Ambulatory Visit: Payer: Self-pay

## 2020-04-09 ENCOUNTER — Encounter (HOSPITAL_BASED_OUTPATIENT_CLINIC_OR_DEPARTMENT_OTHER): Payer: Self-pay | Admitting: General Surgery

## 2020-04-14 ENCOUNTER — Other Ambulatory Visit: Payer: Self-pay | Admitting: Hematology and Oncology

## 2020-04-15 ENCOUNTER — Other Ambulatory Visit: Payer: Self-pay | Admitting: *Deleted

## 2020-04-15 DIAGNOSIS — C50511 Malignant neoplasm of lower-outer quadrant of right female breast: Secondary | ICD-10-CM

## 2020-04-15 DIAGNOSIS — Z171 Estrogen receptor negative status [ER-]: Secondary | ICD-10-CM

## 2020-04-15 NOTE — Progress Notes (Signed)
Spoke with patient regarding plans to come in for Labs, EKG, and Ensure. She is unable to come in prior to surgery and holiday closings. She will arrive 11/29 @ 12:15 pm and will need labs and EKG,

## 2020-04-18 ENCOUNTER — Other Ambulatory Visit (HOSPITAL_COMMUNITY)
Admission: RE | Admit: 2020-04-18 | Discharge: 2020-04-18 | Disposition: A | Discharge status: Home / Self Care | Payer: No Typology Code available for payment source | Source: Ambulatory Visit | Attending: General Surgery | Admitting: General Surgery

## 2020-04-18 DIAGNOSIS — Z01818 Encounter for other preprocedural examination: Secondary | ICD-10-CM | POA: Diagnosis present

## 2020-04-18 DIAGNOSIS — Z20822 Contact with and (suspected) exposure to covid-19: Secondary | ICD-10-CM | POA: Insufficient documentation

## 2020-04-18 LAB — SARS CORONAVIRUS 2 (TAT 6-24 HRS): SARS Coronavirus 2: NEGATIVE

## 2020-04-21 ENCOUNTER — Encounter (HOSPITAL_BASED_OUTPATIENT_CLINIC_OR_DEPARTMENT_OTHER): Payer: Self-pay | Admitting: General Surgery

## 2020-04-21 ENCOUNTER — Other Ambulatory Visit: Payer: Self-pay

## 2020-04-21 ENCOUNTER — Ambulatory Visit (HOSPITAL_BASED_OUTPATIENT_CLINIC_OR_DEPARTMENT_OTHER): Payer: No Typology Code available for payment source | Admitting: Certified Registered"

## 2020-04-21 ENCOUNTER — Ambulatory Visit (HOSPITAL_BASED_OUTPATIENT_CLINIC_OR_DEPARTMENT_OTHER)
Admission: RE | Admit: 2020-04-21 | Discharge: 2020-04-21 | Disposition: A | Discharge status: Home / Self Care | Payer: No Typology Code available for payment source | Attending: General Surgery | Admitting: General Surgery

## 2020-04-21 ENCOUNTER — Encounter (HOSPITAL_BASED_OUTPATIENT_CLINIC_OR_DEPARTMENT_OTHER)
Admission: RE | Disposition: A | Discharge status: Home / Self Care | Payer: Self-pay | Source: Home / Self Care | Attending: General Surgery

## 2020-04-21 DIAGNOSIS — C7931 Secondary malignant neoplasm of brain: Secondary | ICD-10-CM | POA: Diagnosis not present

## 2020-04-21 DIAGNOSIS — C50511 Malignant neoplasm of lower-outer quadrant of right female breast: Secondary | ICD-10-CM | POA: Diagnosis not present

## 2020-04-21 DIAGNOSIS — Z171 Estrogen receptor negative status [ER-]: Secondary | ICD-10-CM | POA: Insufficient documentation

## 2020-04-21 DIAGNOSIS — Z452 Encounter for adjustment and management of vascular access device: Secondary | ICD-10-CM | POA: Diagnosis present

## 2020-04-21 HISTORY — DX: Malignant neoplasm of unspecified site of right female breast: C50.911

## 2020-04-21 HISTORY — DX: Hypothyroidism, unspecified: E03.9

## 2020-04-21 HISTORY — PX: PORT-A-CATH REMOVAL: SHX5289

## 2020-04-21 SURGERY — REMOVAL PORT-A-CATH
Anesthesia: General | Site: Chest | Laterality: Left

## 2020-04-21 MED ORDER — BUPIVACAINE HCL (PF) 0.25 % IJ SOLN
INTRAMUSCULAR | Status: AC
  Filled 2020-04-21: qty 30

## 2020-04-21 MED ORDER — FENTANYL CITRATE (PF) 100 MCG/2ML IJ SOLN
INTRAMUSCULAR | Status: AC
  Filled 2020-04-21: qty 2

## 2020-04-21 MED ORDER — CHLORHEXIDINE GLUCONATE CLOTH 2 % EX PADS: 6.0000 | MEDICATED_PAD | Freq: Once | CUTANEOUS | Status: DC

## 2020-04-21 MED ORDER — LIDOCAINE-EPINEPHRINE (PF) 1 %-1:200000 IJ SOLN
INTRAMUSCULAR | Status: DC | PRN
  Administered 2020-04-21: 10 mL

## 2020-04-21 MED ORDER — ONDANSETRON HCL 4 MG/2ML IJ SOLN
INTRAMUSCULAR | Status: AC
  Filled 2020-04-21: qty 2

## 2020-04-21 MED ORDER — DEXAMETHASONE SODIUM PHOSPHATE 10 MG/ML IJ SOLN
INTRAMUSCULAR | Status: AC
  Filled 2020-04-21: qty 1

## 2020-04-21 MED ORDER — PROMETHAZINE HCL 25 MG/ML IJ SOLN: 6.2500 mg | INTRAMUSCULAR | Status: DC | PRN

## 2020-04-21 MED ORDER — MEPERIDINE HCL 25 MG/ML IJ SOLN: 6.2500 mg | INTRAMUSCULAR | Status: DC | PRN

## 2020-04-21 MED ORDER — LACTATED RINGERS IV SOLN: INTRAVENOUS | Status: DC

## 2020-04-21 MED ORDER — PROPOFOL 10 MG/ML IV BOLUS
INTRAVENOUS | Status: AC
  Filled 2020-04-21: qty 20

## 2020-04-21 MED ORDER — LIDOCAINE-EPINEPHRINE (PF) 1 %-1:200000 IJ SOLN
INTRAMUSCULAR | Status: AC
  Filled 2020-04-21: qty 30

## 2020-04-21 MED ORDER — HYDROMORPHONE HCL 1 MG/ML IJ SOLN: 0.2500 mg | INTRAMUSCULAR | Status: DC | PRN

## 2020-04-21 MED ORDER — PROPOFOL 500 MG/50ML IV EMUL
INTRAVENOUS | Status: DC | PRN
  Administered 2020-04-21: 200 ug/kg/min via INTRAVENOUS

## 2020-04-21 MED ORDER — LIDOCAINE 2% (20 MG/ML) 5 ML SYRINGE
INTRAMUSCULAR | Status: AC
  Filled 2020-04-21: qty 5

## 2020-04-21 MED ORDER — ONDANSETRON HCL 4 MG/2ML IJ SOLN
INTRAMUSCULAR | Status: DC | PRN
  Administered 2020-04-21: 4 mg via INTRAVENOUS

## 2020-04-21 MED ORDER — OXYCODONE HCL 5 MG PO TABS: 5.0000 mg | ORAL_TABLET | Freq: Once | ORAL | Status: DC | PRN

## 2020-04-21 MED ORDER — OXYCODONE HCL 5 MG/5ML PO SOLN: 5.0000 mg | Freq: Once | ORAL | Status: DC | PRN

## 2020-04-21 MED ORDER — HYDROCODONE-ACETAMINOPHEN 5-325 MG PO TABS: 1.0000 | ORAL_TABLET | Freq: Four times a day (QID) | ORAL | 0 refills | Status: DC | PRN

## 2020-04-21 MED ORDER — AMISULPRIDE (ANTIEMETIC) 5 MG/2ML IV SOLN: 10.0000 mg | Freq: Once | INTRAVENOUS | Status: DC | PRN

## 2020-04-21 MED ORDER — MIDAZOLAM HCL 2 MG/2ML IJ SOLN
INTRAMUSCULAR | Status: AC
  Filled 2020-04-21: qty 2

## 2020-04-21 SURGICAL SUPPLY — 29 items
BLADE SURG 15 STRL LF DISP TIS (BLADE) ×1 IMPLANT
BLADE SURG 15 STRL SS (BLADE) ×2
CHLORAPREP W/TINT 26 (MISCELLANEOUS) ×2 IMPLANT
COVER BACK TABLE 60X90IN (DRAPES) ×2 IMPLANT
COVER MAYO STAND STRL (DRAPES) ×2 IMPLANT
COVER WAND RF STERILE (DRAPES) IMPLANT
DECANTER SPIKE VIAL GLASS SM (MISCELLANEOUS) ×2 IMPLANT
DERMABOND ADVANCED (GAUZE/BANDAGES/DRESSINGS) ×1
DERMABOND ADVANCED .7 DNX12 (GAUZE/BANDAGES/DRESSINGS) ×1 IMPLANT
DRAPE LAPAROTOMY 100X72 PEDS (DRAPES) ×2 IMPLANT
DRAPE UTILITY XL STRL (DRAPES) ×2 IMPLANT
ELECT COATED BLADE 2.86 ST (ELECTRODE) IMPLANT
ELECT REM PT RETURN 9FT ADLT (ELECTROSURGICAL)
ELECTRODE REM PT RTRN 9FT ADLT (ELECTROSURGICAL) IMPLANT
GLOVE BIO SURGEON STRL SZ 6.5 (GLOVE) ×2 IMPLANT
GLOVE BIO SURGEON STRL SZ7.5 (GLOVE) ×2 IMPLANT
GLOVE ECLIPSE 6.5 STRL STRAW (GLOVE) ×2 IMPLANT
GLOVE SURG UNDER POLY LF SZ7 (GLOVE) ×2 IMPLANT
GOWN STRL REUS W/ TWL LRG LVL3 (GOWN DISPOSABLE) ×2 IMPLANT
GOWN STRL REUS W/TWL LRG LVL3 (GOWN DISPOSABLE) ×4
NEEDLE HYPO 25X1 1.5 SAFETY (NEEDLE) ×2 IMPLANT
PACK BASIN DAY SURGERY FS (CUSTOM PROCEDURE TRAY) ×2 IMPLANT
PENCIL SMOKE EVACUATOR (MISCELLANEOUS) IMPLANT
SLEEVE SCD COMPRESS KNEE MED (MISCELLANEOUS) IMPLANT
SUT MON AB 4-0 PC3 18 (SUTURE) ×2 IMPLANT
SUT VIC AB 3-0 SH 27 (SUTURE) ×2
SUT VIC AB 3-0 SH 27X BRD (SUTURE) ×1 IMPLANT
SYR CONTROL 10ML LL (SYRINGE) ×2 IMPLANT
TOWEL GREEN STERILE FF (TOWEL DISPOSABLE) ×2 IMPLANT

## 2020-04-21 NOTE — Interval H&P Note (Signed)
History and Physical Interval Note:  04/21/2020 12:37 PM  Kaitlyn Keith  has presented today for surgery, with the diagnosis of RIGHT BREAST CANCER.  The various methods of treatment have been discussed with the patient and family. After consideration of risks, benefits and other options for treatment, the patient has consented to  Procedure(s): REMOVAL PORT-A-CATH (N/A) as a surgical intervention.  The patient's history has been reviewed, patient examined, no change in status, stable for surgery.  I have reviewed the patient's chart and labs.  Questions were answered to the patient's satisfaction.     [name]

## 2020-04-21 NOTE — Discharge Instructions (Signed)

## 2020-04-21 NOTE — Anesthesia Postprocedure Evaluation (Signed)
Anesthesia Post Note  Patient: Kaitlyn Keith  Procedure(s) Performed: REMOVAL PORT-A-CATH (Left Chest)     Patient location during evaluation: PACU Anesthesia Type: MAC Level of consciousness: awake and alert Pain management: pain level controlled Vital Signs Assessment: post-procedure vital signs reviewed and stable Respiratory status: spontaneous breathing, nonlabored ventilation and respiratory function stable Cardiovascular status: blood pressure returned to baseline and stable Postop Assessment: no apparent nausea or vomiting Anesthetic complications: no   No complications documented.  Last Vitals:  Vitals:   04/21/20 1406 04/21/20 1423  BP: 134/76 130/85  Pulse: 61 63  Resp: 16 18  Temp:  36.5 C  SpO2: 96% 95%    Last Pain:  Vitals:   04/21/20 1423  TempSrc:   PainSc: 0-No pain                 Lynda Rainwater

## 2020-04-21 NOTE — H&P (Signed)
Kaitlyn Keith  Location: [facility] Surgery Patient #: [id number] DOB: [birth year] Divorced / Language: English / Race: Black or African American Female   History of Present Illness The patient is a 54 year old female who presents for a follow-up for Breast cancer. The patient is a 54 year old white female who is 1-1/2 years status post right breast lumpectomy and sentinel node mapping for a T2 N0 right breast cancer that was triple negative. 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 She is about 2 weeks status post excision of a couple subcutaneous nodules from the back. These both came back as metastatic breast cancer. She also complains of a new headache. She describes it as similar to her previous headache that she had with her brain metastasis. She has been started on a new medicine by the oncologist.   Allergies  No known drug allergies   Medication History  Everolimus (7.5MG  Tablet, Oral) Active. Lisinopril-Hydrochlorothiazide (Oral) Specific strength unknown - Active. Medications Reconciled    Review of Systems  General Present- Fatigue and Night Sweats. Not Present- Appetite Loss, Chills, Fever, Weight Gain and Weight Loss. HEENT Not Present- Earache, Hearing Loss, Hoarseness, Nose Bleed, Oral Ulcers, Ringing in the Ears, Seasonal Allergies, Sinus Pain, Sore Throat, Visual Disturbances, Wears glasses/contact lenses and Yellow Eyes. Respiratory Not Present- Bloody sputum, Chronic Cough, Difficulty Breathing, Snoring and Wheezing. Breast Not Present- Breast Mass, Breast Pain, Nipple Discharge and Skin Changes. Cardiovascular Not Present- Chest Pain, Difficulty Breathing Lying Down, Leg Cramps, Palpitations, Rapid Heart Rate, Shortness of Breath and Swelling of Extremities. Gastrointestinal Not Present- Abdominal Pain, Bloating, Bloody Stool, Change in Bowel Habits, Chronic diarrhea, Constipation, Difficulty Swallowing, Excessive gas,  Gets full quickly at meals, Hemorrhoids, Indigestion, Nausea, Rectal Pain and Vomiting. Musculoskeletal Present- Joint Pain, Joint Stiffness and Muscle Pain. Not Present- Back Pain, Muscle Weakness and Swelling of Extremities. Neurological Present- Numbness and Tingling. Not Present- Decreased Memory, Fainting, Headaches, Seizures, Tremor, Trouble walking and Weakness. Psychiatric Not Present- Anxiety, Bipolar, Change in Sleep Pattern, Depression, Fearful and Frequent crying. Endocrine Not Present- Cold Intolerance, Excessive Hunger, Hair Changes, Heat Intolerance, Hot flashes and New Diabetes. Hematology Not Present- Easy Bruising, Excessive bleeding, Gland problems, HIV and Persistent Infections.  Vitals  Weight: 172.8 lb Height: 64in Body Surface Area: 1.84 m Body Mass Index: 29.66 kg/m  Temp.: 98.90F  Pulse: 74 (Regular)  P.OX: 97% (Room air) BP: 142/82(Sitting, Left Arm, Standard)       Physical Exam  General Mental Status-Alert. General Appearance-Consistent with stated age. Hydration-Well hydrated. Voice-Normal.  Head and Neck Head-normocephalic, atraumatic with no lesions or palpable masses. Trachea-midline. Thyroid Gland Characteristics - normal size and consistency.  Eye Eyeball - Bilateral-Extraocular movements intact. Sclera/Conjunctiva - Bilateral-No scleral icterus.  Chest and Lung Exam Chest and lung exam reveals -quiet, even and easy respiratory effort with no use of accessory muscles and on auscultation, normal breath sounds, no adventitious sounds and normal vocal resonance. Inspection Chest Wall - Normal. Back - normal.  Breast Note: There is no palpable mass in either breast. There is no palpable axillary, supraclavicular, or cervical lymphadenopathy. There doesn't feel to be some firmness posteriorly in the right axilla in an area that seems to overlie the latissimus muscle. this could be scar from her previous  surgery.   Cardiovascular Cardiovascular examination reveals -normal heart sounds, regular rate and rhythm with no murmurs and normal pedal pulses bilaterally.  Abdomen Inspection Inspection of the abdomen reveals - No Hernias. Skin - Scar -  no surgical scars. Palpation/Percussion Palpation and Percussion of the abdomen reveal - Soft, Non Tender, No Rebound tenderness, No Rigidity (guarding) and No hepatosplenomegaly. Auscultation Auscultation of the abdomen reveals - Bowel sounds normal. Note: There are no palpable masses   Neurologic Neurologic evaluation reveals -alert and oriented x 3 with no impairment of recent or remote memory. Mental Status-Normal.  Musculoskeletal Normal Exam - Left-Upper Extremity Strength Normal and Lower Extremity Strength Normal. Normal Exam - Right-Upper Extremity Strength Normal and Lower Extremity Strength Normal. Note: The 2 incisions on the back are healing nicely with no sign of infection or seroma   Lymphatic Head & Neck  General Head & Neck Lymphatics: Bilateral - Description - Normal. Axillary  General Axillary Region: Bilateral - Description - Normal. Tenderness - Non Tender. Femoral & Inguinal  Generalized Femoral & Inguinal Lymphatics: Bilateral - Description - Normal. Tenderness - Non Tender.    Assessment & Plan  PRIMARY CANCER OF LOWER OUTER QUADRANT OF RIGHT FEMALE BREAST (C50.511) Impression: The patient is about 1-1/2 years status post right breast lumpectomy for breast cancer and a couple weeks status post excision of 2 metastatic breast cancer lesions from the back. At this point her treatment will be dictated by the oncologist. I will plan to see her back on a when necessary basis. I will order a head CT scan given her new symptom of headache which she describes as similar to her previous headache that developed when she had a brain metastasis Current Plans Follow up as needed  She presents today for port  removal. Risks and benefits discussed with patient and she understands and wishes to proceed

## 2020-04-21 NOTE — Op Note (Signed)
04/21/2020  1:40 PM  PATIENT:  Kaitlyn Keith  54 y.o. female  PRE-OPERATIVE DIAGNOSIS:  RIGHT BREAST CANCER  POST-OPERATIVE DIAGNOSIS:  RIGHT BREAST CANCER  PROCEDURE:  Procedure(s): REMOVAL PORT-A-CATH (Left)  SURGEON:  Surgeon(s) and Role:    * [name], MD - Primary  PHYSICIAN ASSISTANT:   ASSISTANTS: none   ANESTHESIA:   local and IV sedation  EBL:  minimal   BLOOD ADMINISTERED:none  DRAINS: none   LOCAL MEDICATIONS USED:  MARCAINE     SPECIMEN:  No Specimen  DISPOSITION OF SPECIMEN:  N/A  COUNTS:  YES  TOURNIQUET:  * No tourniquets in log *  DICTATION: .Dragon Dictation   After informed consent was obtained the patient was brought to the operating room and placed in the supine position on the operating table.  After adequate IV sedation had been given the patient's left chest was prepped with ChloraPrep, allowed to dry, and draped in usual sterile manner.  An appropriate timeout was performed.  The area around the port was infiltrated with 1% lidocaine and quarter percent Marcaine.  A small incision was then made through the previous incision with a 15 blade knife.  The incision was carried through the skin and subcutaneous tissue sharply with a 15 blade until the capsule surrounding the port was opened.  The 2 anchoring stitches were divided and removed.  The port was then gently pushed out of its pocket and with gentle traction was removed from the patient.  Pressure was held on the area for several minutes until the area was completely hemostatic.  The previous tubing tract was closed with a figure-of-eight 3-0 Vicryl stitch.  The subcutaneous tissue was closed with interrupted 3-0 Vicryl stitches.  The skin was closed with a running 4-0 Monocryl subcuticular stitch.  Dermabond dressings were applied.  The patient tolerated the procedure well.  At the end of the case all needle sponge and instrument counts were correct.  The patient was then awakened  and taken to recovery in stable condition.  PLAN OF CARE: Discharge to home after PACU  PATIENT DISPOSITION:  PACU - hemodynamically stable.   Delay start of Pharmacological VTE agent (>24hrs) due to surgical blood loss or risk of bleeding: not applicable

## 2020-04-21 NOTE — Anesthesia Preprocedure Evaluation (Signed)
Anesthesia Evaluation  Patient identified by MRN, date of birth, ID band Patient awake    Reviewed: Allergy & Precautions, NPO status , Patient's Chart, lab work & pertinent test results  Airway Mallampati: II  TM Distance: >3 FB Neck ROM: Full    Dental no notable dental hx.    Pulmonary    Pulmonary exam normal breath sounds clear to auscultation       Cardiovascular hypertension, Pt. on medications Normal cardiovascular exam Rhythm:Regular Rate:Normal     Neuro/Psych  Headaches, Anxiety Depression    GI/Hepatic   Endo/Other  Hypothyroidism   Renal/GU      Musculoskeletal  (+) Arthritis , Osteoarthritis,    Abdominal   Peds  Hematology   Anesthesia Other Findings   Reproductive/Obstetrics                             Anesthesia Physical  Anesthesia Plan  ASA: III  Anesthesia Plan: General   Post-op Pain Management:    Induction: Intravenous  PONV Risk Score and Plan: 3 and Ondansetron, Treatment may vary due to age or medical condition, Dexamethasone and Midazolam  Airway Management Planned: LMA  Additional Equipment:   Intra-op Plan:   Post-operative Plan: Extubation in OR  Informed Consent: I have reviewed the patients History and Physical, chart, labs and discussed the procedure including the risks, benefits and alternatives for the proposed anesthesia with the patient or authorized representative who has indicated his/her understanding and acceptance.     Dental advisory given  Plan Discussed with: CRNA and Surgeon  Anesthesia Plan Comments:         Anesthesia Quick Evaluation

## 2020-04-21 NOTE — Transfer of Care (Signed)
Immediate Anesthesia Transfer of Care Note  Patient: Kamylle Axelson  Procedure(s) Performed: REMOVAL PORT-A-CATH (Left Chest)  Patient Location: PACU  Anesthesia Type:MAC  Level of Consciousness: awake, alert  and oriented  Airway & Oxygen Therapy: Patient Spontanous Breathing and Patient connected to face mask oxygen  Post-op Assessment: Report given to RN and Post -op Vital signs reviewed and stable  Post vital signs: Reviewed and stable  Last Vitals:  Vitals Value Taken Time  BP    Temp    Pulse 76 04/21/20 1344  Resp 17 04/21/20 1344  SpO2 90 % 04/21/20 1344  Vitals shown include unvalidated device data.  Last Pain:  Vitals:   04/21/20 1245  TempSrc: Oral  PainSc: 0-No pain      Patients Stated Pain Goal: 1 (48/59/27 6394)  Complications: No complications documented.

## 2020-04-22 ENCOUNTER — Encounter (HOSPITAL_BASED_OUTPATIENT_CLINIC_OR_DEPARTMENT_OTHER): Payer: Self-pay | Admitting: General Surgery

## 2020-05-02 ENCOUNTER — Encounter: Payer: Self-pay | Admitting: Licensed Clinical Social Worker

## 2020-05-02 NOTE — Progress Notes (Signed)
[place] Work  Clinical Social Work was referred by Red Christians for assessment of psychosocial needs.  Clinical Social Worker contacted patient by phone  to offer support and assess for needs.  Per patient, medical bills are becoming overwhelming and difficult to pay the monthly amount. She has been unable to work out another plan with the billing department. CSW discussed various breast cancer foundations that may be able to assist and both e-mailed and sent hard copies to patient per her request.    Patient will work on applications and contact this CSW with any questions.    [place], [place] Worker [employer]

## 2020-05-13 ENCOUNTER — Encounter: Payer: Self-pay | Admitting: Radiation Therapy

## 2020-05-13 ENCOUNTER — Other Ambulatory Visit: Payer: Self-pay | Admitting: Radiation Therapy

## 2020-05-13 ENCOUNTER — Telehealth: Payer: Self-pay | Admitting: Hematology and Oncology

## 2020-05-13 DIAGNOSIS — C7931 Secondary malignant neoplasm of brain: Secondary | ICD-10-CM

## 2020-05-13 NOTE — Progress Notes (Signed)
Sent an Higher education careers adviser to [name] to inform her of the upcoming brain MRI and follow-up with [name] scheduled in March 2022.   ----------------------------------  Good morning [name], this is Manuela Schwartz reaching out to let you know we have the March brain MRI and telephone follow-up with [name] set up for you.   MRI- Sat 3/12, arrive at 8:30 for 9:00 scan  [facility]. [address] )  Telephone follow-up (NOT IN PERSON) 3/16 - [name] will call  you around 9:30 to review those results with you.

## 2020-05-13 NOTE — Telephone Encounter (Signed)
Rescheduled appt due to provider PAL. Patient is aware of changes. 

## 2020-06-20 ENCOUNTER — Ambulatory Visit: Payer: No Typology Code available for payment source | Admitting: Hematology and Oncology

## 2020-06-20 ENCOUNTER — Other Ambulatory Visit: Payer: No Typology Code available for payment source

## 2020-06-24 ENCOUNTER — Ambulatory Visit
Admission: EM | Admit: 2020-06-24 | Discharge: 2020-06-24 | Disposition: A | Discharge status: Home / Self Care | Payer: No Typology Code available for payment source | Attending: Emergency Medicine | Admitting: Emergency Medicine

## 2020-06-24 ENCOUNTER — Ambulatory Visit (INDEPENDENT_AMBULATORY_CARE_PROVIDER_SITE_OTHER): Payer: No Typology Code available for payment source

## 2020-06-24 ENCOUNTER — Other Ambulatory Visit: Payer: Self-pay

## 2020-06-24 DIAGNOSIS — R0789 Other chest pain: Secondary | ICD-10-CM | POA: Diagnosis not present

## 2020-06-24 DIAGNOSIS — R0602 Shortness of breath: Secondary | ICD-10-CM

## 2020-06-24 DIAGNOSIS — R079 Chest pain, unspecified: Secondary | ICD-10-CM

## 2020-06-24 MED ORDER — HYDROXYZINE HCL 25 MG PO TABS: 25.0000 mg | ORAL_TABLET | Freq: Four times a day (QID) | ORAL | 0 refills | Status: DC | PRN

## 2020-06-24 NOTE — Discharge Instructions (Signed)
EKG normal Chest x-ray normal May try hydroxyzine as needed for any anxiety Follow-up with cardiology for possible monitoring of heart Please return if developing any worsening or changing symptoms

## 2020-06-24 NOTE — ED Provider Notes (Signed)
[facility]    CSN: [id number] Arrival date & time: 06/24/20  1735      History   Chief Complaint Chief Complaint  Patient presents with  . Chest Pain    HPI Kaitlyn Keith is a 55 y.o. female history of brain cancer, breast cancer, hypertension, presenting today for evaluation of chest pain.  Reports for the past 3 weeks she has had intermittent sensations of pain in her chest.  Typically located on the left side.  Describes as a quick 1-2 second heaviness/twinge, then goes away.  Will recur multiple times throughout the day.  She denies any associated headaches, vision changes.  Denies dizziness or lightheadedness.  Does occasionally have some shortness of breath described as needing to take a deep breath, but denies any cough or difficulty breathing.  She denies any history of heart problems, denies diabetes, denies tobacco use.  She is currently in remission from her breast/brain cancer.  She completed chemo and immunotherapy, last treatment was this past October.  Unsure which type of chemo she took.  Denies any leg pain or leg swelling.  She does report history of anxiety and is unsure if symptoms could be related to this.    HPI  Past Medical History:  Diagnosis Date  . Anxiety   . Arthritis    back  . Back pain   . 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 Breast cancer ([place])   . Breast cancer of lower-outer quadrant of right female breast ([place]) 12/05/2014  . Cancer of right breast metastatic to brain [facility])   . Depression   . FH: chemotherapy 12/2014-04/2015  . Genetic testing 12/10/2016   Ms. Toney Keith underwent genetic counseling and testing for hereditary cancer syndromes on 11/18/2016. 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 Mutations in MSH6 are associated with a hereditary cancer syndrome called Lynch syndrome. For more detailed discussion, please see genetic  counseling documentation from 12/10/2016.  Testing was perfo  . Headache    due to brain cancer, no longer having them  . Hot flashes   . Hypertension   . Hypothyroidism   . 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 Toney Keith underwent genetic counseling and testing for hereditary cancer syndromes on 11/18/2016. 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 Mutations in MSH6 are associated with a hereditary cancer syndrome called Lynch syndrome. For more detailed discussion, please see genetic counseling documentation from 12/10/2016.  Testing was perfo  . Radiation 07/23/15-09/05/15   right breast 50.4 Gy, boost of 10 Gy    Patient Active Problem List   Diagnosis Date Noted  . 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 Goals of care, counseling/discussion 07/04/2017  . Genetic testing 12/10/2016  . 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 Cerebellar mass 07/27/2016  . Cerebellar tumor ([place]) 07/27/2016  . 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 C2 cervical fracture ([place]) 07/12/2016  . Hypertension 07/12/2016  . Degenerative spinal arthritis 11/18/2015  . 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 Paronychia of great toe, left 02/04/2015  . Breast cancer of lower-outer quadrant of right female breast ([place]) 12/05/2014    Past Surgical History:  Procedure Laterality Date  . APPLICATION OF CRANIAL NAVIGATION Right 07/27/2016   Procedure: APPLICATION OF CRANIAL NAVIGATION;  Surgeon: Kevan Ny Ditty, MD;  Location: [place];  Service: Neurosurgery;  Laterality: Right;  .  BIOPSY OF SKIN SUBCUTANEOUS TISSUE AND/OR MUCOUS MEMBRANE Left 12/24/2016   Procedure: OPEN BIOPSY LESIONS ON LEFT LOWER BACK AND SHOULDER BLADE;  Surgeon: [name], MD;  Location: [place];  Service: General;  Laterality: Left;  . BREAST LUMPECTOMY WITH NEEDLE LOCALIZATION AND AXILLARY SENTINEL LYMPH NODE BX  Right 06/16/2015   Procedure: BREAST LUMPECTOMY WITH NEEDLE LOCALIZATION AND AXILLARY SENTINEL LYMPH NODE BX;  Surgeon: [name], MD;  Location: [place];  Service: General;  Laterality: Right;  . BREAST REDUCTION SURGERY    . CRANIOTOMY Right 07/27/2016   Procedure: Right Suboccipital craniotomy for tumor resection with stereotactic navigation;  Surgeon: Kevan Ny Ditty, MD;  Location: [place];  Service: Neurosurgery;  Laterality: Right;  . PORT-A-CATH REMOVAL N/A 06/16/2015   Procedure: REMOVAL PORT-A-CATH;  Surgeon: [name], MD;  Location: [place];  Service: General;  Laterality: N/A;  . PORT-A-CATH REMOVAL Left 04/21/2020   Procedure: REMOVAL PORT-A-CATH;  Surgeon: [name], MD;  Location: [place];  Service: General;  Laterality: Left;  . PORTACATH PLACEMENT N/A 12/23/2014   Procedure: INSERTION PORT-A-CATH;  Surgeon: [name], MD;  Location: [place];  Service: General;  Laterality: N/A;  . PORTACATH PLACEMENT Left 07/08/2017   Procedure: INSERTION PORT-A-CATH;  Surgeon: [name], MD;  Location: [place];  Service: General;  Laterality: Left;    OB History   No obstetric history on file.      Home Medications    Prior to Admission medications   Medication Sig Start Date End Date Taking? Authorizing Provider  hydrOXYzine (ATARAX/VISTARIL) 25 MG tablet Take 1 tablet (25 mg total) by mouth every 6 (six) hours as needed for anxiety. 06/24/20  Yes Amon Costilla C, PA-C  alprazolam (XANAX) 2 MG tablet Take 1 tablet (2 mg total) by mouth at bedtime. 02/11/20   [name], MD  levothyroxine (SYNTHROID) 125 MCG tablet TAKE 1 TABLET BY MOUTH EVERY DAY BEFORE BREAKFAST 04/15/20   [name], MD  lisinopril-hydrochlorothiazide (ZESTORETIC) 20-12.5 MG tablet TAKE 1 TABLET BY MOUTH TWICE A DAY 09/21/19   [name], MD  gabapentin (NEURONTIN) 300 MG capsule Take 1 capsule (300 mg  total) by mouth 3 (three) times daily. 06/15/19 09/16/19  [name], MD    Family History Family History  Problem Relation Age of Onset  . Aneurysm Mother 35       d.55  . Heart attack Father 47       d.62  . Endometrial cancer Sister 67  . Lung cancer Maternal Uncle        d.68s  . Cancer Maternal Grandmother        unspecified type-possibly stomach d.88s    Social History Social History   Tobacco Use  . Smoking status: Never Smoker  . Smokeless tobacco: Never Used  Vaping Use  . Vaping Use: Never used  Substance Use Topics  . Alcohol use: Yes    Comment: social  . Drug use: No     Allergies   Patient has no known allergies.   Review of Systems Review of Systems  Constitutional: Negative for activity change, appetite change, chills, fatigue and fever.  HENT: Negative for congestion, ear pain, rhinorrhea, sinus pressure, sore throat and trouble swallowing.   Eyes: Negative for discharge and redness.  Respiratory: Negative for cough, chest tightness and shortness of breath.   Cardiovascular: Positive for chest pain.  Gastrointestinal: Negative for abdominal  pain, diarrhea, nausea and vomiting.  Musculoskeletal: Negative for myalgias.  Skin: Negative for rash.  Neurological: Negative for dizziness, light-headedness and headaches.     Physical Exam Triage Vital Signs ED Triage Vitals  Enc Vitals Group     BP      Pulse      Resp      Temp      Temp src      SpO2      Weight      Height      Head Circumference      Peak Flow      Pain Score      Pain Loc      Pain Edu?      Excl. in [place]?    No data found.  Updated Vital Signs BP 136/82 (BP Location: Left Arm)   Pulse 73   Temp 98.8 F (37.1 C) (Oral)   Resp 18   SpO2 97%   Visual Acuity Right Eye Distance:   Left Eye Distance:   Bilateral Distance:    Right Eye Near:   Left Eye Near:    Bilateral Near:     Physical Exam Vitals and nursing note reviewed.  Constitutional:       Appearance: She is well-developed and well-nourished.     Comments: No acute distress  HENT:     Head: Normocephalic and atraumatic.     Nose: Nose normal.     Mouth/Throat:     Comments: Oral mucosa pink and moist, no tonsillar enlargement or exudate. Posterior pharynx patent and nonerythematous, no uvula deviation or swelling. Normal phonation.  Eyes:     Extraocular Movements: Extraocular movements intact.     Conjunctiva/sclera: Conjunctivae normal.     Pupils: Pupils are equal, round, and reactive to light.  Cardiovascular:     Rate and Rhythm: Normal rate and regular rhythm.  Pulmonary:     Effort: Pulmonary effort is normal. No respiratory distress.     Comments: Breathing comfortably at rest, CTABL, no wheezing, rales or other adventitious sounds auscultated Abdominal:     General: There is no distension.  Musculoskeletal:        General: Normal range of motion.     Cervical back: Neck supple.  Skin:    General: Skin is warm and dry.  Neurological:     Mental Status: She is alert and oriented to person, place, and time.  Psychiatric:        Mood and Affect: Mood and affect normal.      UC Treatments / Results  Labs (all labs ordered are listed, but only abnormal results are displayed) Labs Reviewed - No data to display  EKG   Radiology DG Chest 2 View  Result Date: 06/24/2020 CLINICAL DATA:  Chest pain and heaviness.  Shortness of breath. EXAM: CHEST - 2 VIEW COMPARISON:  Chest CT 03/14/2020 FINDINGS: The cardiomediastinal contours are normal. Minimal scarring at the right lung base. Pulmonary vasculature is normal. No consolidation, pleural effusion, or pneumothorax. No acute osseous abnormalities are seen. Surgical clips in the right axilla. IMPRESSION: No acute chest findings.  Mild scarring at the right lung base. Electronically Signed   By: Keith Rake M.D.   On: 06/24/2020 19:20    Procedures Procedures (including critical care time)  Medications  Ordered in UC Medications - No data to display  Initial Impression / Assessment and Plan / UC Course  I have reviewed the triage vital signs and the  nursing notes.  Pertinent labs & imaging results that were available during my care of the patient were reviewed by me and considered in my medical decision making (see chart for details).     EKG normal sinus rhythm, no acute signs of ischemia or infarction, chest x-ray unremarkable, some scarring at right lung base, likely from breast cancer on the right.  Symptoms seem less likely MSK etiology as well as less likely GERD related given brief nature of symptoms.  Not triggered by movement.  Possible anxiety component, may benefit from Holter monitoring to evaluate for any PVCs or other irregular beats that may be triggering symptoms.  Provided hydroxyzine to use as needed for anxiety and monitor for decrease in frequency of symptoms with improvement of anxiety.  If symptoms changing or worsening patient to follow-up for reevaluation.  Discussed strict return precautions. Patient verbalized understanding and is agreeable with plan.  Final Clinical Impressions(s) / UC Diagnoses   Final diagnoses:  Atypical chest pain     Discharge Instructions     EKG normal Chest x-ray normal May try hydroxyzine as needed for any anxiety Follow-up with cardiology for possible monitoring of heart Please return if developing any worsening or changing symptoms    ED Prescriptions    Medication Sig Dispense Auth. Provider   hydrOXYzine (ATARAX/VISTARIL) 25 MG tablet Take 1 tablet (25 mg total) by mouth every 6 (six) hours as needed for anxiety. 12 tablet Gagandeep Kossman, [place] C, PA-C     PDMP not reviewed this encounter.   Janith Lima, PA-C 06/25/20 1128

## 2020-06-24 NOTE — ED Triage Notes (Signed)
Pt c/o lt side chest pain/heavines off and on for the last week. States some SOB. Denies diaphoresis but gets hot feeling.

## 2020-07-06 NOTE — Progress Notes (Signed)
Patient Care Team: Patient, [name] as PCP - General (General Practice) Kaitlyn Miner, MD as Consulting Physician (General Surgery) Serena Croissant, MD as Consulting Physician (Hematology and Oncology) Antony Blackbird, MD as Consulting Physician (Radiation Oncology) Pershing Proud, RN as Registered Nurse Donnelly Angelica, RN as Registered Nurse Hubbard Hartshorn, NP (Inactive) as Nurse Practitioner (Nurse Practitioner)  DIAGNOSIS:    ICD-10-CM   1. 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, estrogen receptor negative (HCC)  C50.511 CT CHEST ABDOMEN PELVIS W CONTRAST   Z17.1     SUMMARY OF ONCOLOGIC HISTORY: Oncology History  Breast cancer of lower-outer quadrant of right female breast (HCC)  12/03/2014 Mammogram   Right breast mass 1.9 cm it o'clock position 8 cm depth from the nipple   12/03/2014 Initial Diagnosis   Right breast biopsy 8:00: Invasive ductal carcinoma, grade 3, ER 0%, PR 0%, Ki-67 90%, HER-2 negative ratio 1.43   12/10/2014 Breast MRI   Right breast lower outer quadrant: 2.3 x 2.4 x 2.4 cm rim-enhancing mass abuts the pectoralis fascia but no enhancement of pectoralis muscle, second focus of artifact?'s second tissue marker clip, no lymph nodes   12/10/2014 Clinical Stage   Stage IIA: T2 N0   12/24/2014 - 04/29/2015 Neo-Adjuvant Chemotherapy   Dose dense Adriamycin and Cytoxan 4 followed by weekly Abraxane 12   05/02/2015 Breast MRI   complete radiologic response   06/16/2015 Surgery   Left Lumpectomy: Complete path Response, 0/2 LN   06/16/2015 Pathologic Stage   ypT0 ypN0   07/23/2015 - 09/05/2015 Radiation Therapy   Adjuvant RT: 50.4 Gy in 28 fractions and a boost of 10 Gy in 5 fractions to total dose of 60.4 Gy   10/24/2015 Survivorship   SCP mailed to patient in lieu of in person visit.   07/26/2016 - 07/27/2016 Radiation Therapy    SRS brain   07/27/2016 -  07/29/2016 Hospital Admission   Cerebellar mass: Right suboccipital craniotomy for tumor resection with stereotactic navigation: Metastatic poorly differentiated adenocarcinoma with extensive necrosis positive for CK 7, MOC 31, CK 5/6; Neg for Er/PR, GATA-3, GCDFP CDX2, Napsin A and TTF-1   11/17/2016 PET scan   Subcutaneous nodules in the neck, upper back, left arm, abdomen and pelvis,. 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 in BARD 1, BRCA2 and NF1   12/31/2016 Miscellaneous   Everolimus 10 mg daily for cycle 1 if she cannot tolerate will decrease to 7.5 mg daily   05/24/2017 - 07/04/2017 Chemotherapy   Xeloda 2000 mg 2 weeks on 1 week off stopped due to progression of disease based on CT scans done 06/27/2017   07/25/2017 - 03/13/2018 Chemotherapy   [place] days 1 and 8 every 3 weeks stopped for progression    03/20/2018 - 03/31/2018 Radiation Therapy   Radiation to lymph nodes   04/03/2018 -  Chemotherapy   Keytruda every 3 weeks      CHIEF COMPLIANT: Follow-up of metastatic breast cancer on Keytruda  INTERVAL HISTORY: Kaitlyn Keith is a 55 y.o. with above-mentioned history of metastatic breast cancer with brain metastasis. CT CAP on 03/14/20 showed no local  recurrence or metastases. She presents to the clinic today for follow-up.  Shes doing quite well with occasional twitching sensation in the chest wall.  ALLERGIES:  has No Known Allergies.  MEDICATIONS:  Current Outpatient Medications  Medication Sig Dispense Refill  . Carboxymeth-Cellulose-CitricAc (PLENITY) CAPS Take 3 capsules by mouth in the morning and at bedtime. 180 capsule 3  . alprazolam  (XANAX) 2 MG tablet Take 1 tablet (2 mg total) by mouth at bedtime. 30 tablet 3  . hydrOXYzine (ATARAX/VISTARIL) 25 MG tablet Take 1 tablet (25 mg total) by mouth every 6 (six) hours as needed for anxiety. 12 tablet 0  . levothyroxine (SYNTHROID) 125 MCG tablet TAKE 1 TABLET BY MOUTH EVERY DAY BEFORE BREAKFAST 90 tablet 0  . lisinopril-hydrochlorothiazide (ZESTORETIC) 20-12.5 MG tablet TAKE 1 TABLET BY MOUTH TWICE A DAY 180 tablet 3   No current facility-administered medications for this visit.    PHYSICAL EXAMINATION: ECOG PERFORMANCE STATUS: 1 - Symptomatic but completely ambulatory  Vitals:   07/07/20 0847  BP: 140/90  Pulse: 67  Resp: 18  Temp: 98.8 F (37.1 C)  SpO2: 100%   Filed Weights   07/07/20 0847  Weight: 181 lb 1.6 oz (82.1 kg)     LABORATORY DATA:  I have reviewed the data as listed CMP Latest Ref Rng & Units 03/17/2020 02/29/2020 02/11/2020  Glucose 70 - 99 mg/dL 89 161(W) 960(A)  BUN 6 - 20 mg/dL 12 10 14   Creatinine 0.44 - 1.00 mg/dL 5.40 9.81 1.91  Sodium 135 - 145 mmol/L 140 139 139  Potassium 3.5 - 5.1 mmol/L 3.4(L) 3.4(L) 3.5  Chloride 98 - 111 mmol/L 105 100 106  CO2 22 - 32 mmol/L 27 27 25   Calcium 8.9 - 10.3 mg/dL 9.3 4.7(W) 2.9(F)  Total Protein 6.5 - 8.1 g/dL 7.4 7.2 7.1  Total Bilirubin 0.3 - 1.2 mg/dL 0.7 0.7 0.5  Alkaline Phos 38 - 126 U/L 103 92 97  AST 15 - 41 U/L 18 17 16   ALT 0 - 44 U/L 12 13 11     Lab Results  Component Value Date   WBC 5.7 07/07/2020   HGB 13.0 07/07/2020   HCT 39.3 07/07/2020   MCV 87.5 07/07/2020   PLT 192 07/07/2020   NEUTROABS 4.1 07/07/2020    ASSESSMENT & PLAN:  Breast cancer of lower-outer quadrant of right female breast (HCC) Right breast biopsy 12/03/2014 8:00: Invasive ductal carcinoma, grade 3, ER 0%, PR 0%, 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 Neoadj chemo 12/24/14- 04/29/15 AC x  4 foll by Abraxane X 12 Rt Lumpectomy: Path CR 0/2 LN Adj XRT 07/23/15- 09/08/15 PET/CT scan 11/17/2016:Subcutaneous nodules in the neck, upper back, left arm, abdomen and pelvis Patient progressed on Xeloda January 2019-07/04/2017 stopped due to progression of disease Cerebellar mass diagnosed 07/12/2016: Resection followed by stereotactic radiation 07/29/2016 Lymph node from 03/02/2018 biopsy: PDL1+ Pembrolizumab completed 01/22/2020 ------------------------------------------------------------------------------------------------------------------------------------------------ Current treatment: Pembrolizumab to be completed end of October 2021. Pembrolizumab toxicities: Hypothyroidism: On Synthroid 125 mcg daily MRI brain 02/02/2020: Stable posttreatment changes. (Next one 08/02/20)  03/14/20: CT CAP: No evidence of mets  Chest wall symptoms: Likely musculoskeletal CT CAP will be done in 1 week and follow up after virtually She now uses Doterra products. For weight loss, I sent a prescription for Plenity.    Orders Placed This Encounter  Procedures  .  CT CHEST ABDOMEN PELVIS W CONTRAST    Standing Status:   Future    Standing Expiration Date:   07/07/2021    Order Specific Question:   If indicated for the ordered procedure, I authorize the administration of contrast media per Radiology protocol    Answer:   Yes    Order Specific Question:   Is patient pregnant?    Answer:   No    Order Specific Question:   Preferred imaging location?    Answer:   [facility]    Order Specific Question:   Release to patient    Answer:   Immediate    Order Specific Question:   Is Oral Contrast requested for this exam?    Answer:   Yes, Per Radiology protocol    Order Specific Question:   Reason for Exam (SYMPTOM  OR DIAGNOSIS REQUIRED)    Answer:   Met breast cancer restaging   The patient has a good understanding of the overall plan. she agrees with it. she will call with any problems that  may develop before the next visit here.  Total time spent: 30 mins including face to face time and time spent for planning, charting and coordination of care  Sabas Sous, MD, MPH 07/07/2020  I, Molly Dorshimer, am acting as scribe for Dr. Serena Croissant.  I have reviewed the above documentation for accuracy and completeness, and I agree with the above.

## 2020-07-06 NOTE — Assessment & Plan Note (Signed)
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 Neoadj chemo 12/24/14- 04/29/15 AC x 4 foll by Abraxane X 12 Rt Lumpectomy: Path CR 0/2 LN Adj XRT 07/23/15- 09/08/15 PET/CT scan 11/17/2016:Subcutaneous nodules in the neck, upper back, left arm, abdomen and pelvis Patient progressed on Xeloda January 2019-07/04/2017 stopped due to progression of disease Cerebellar mass diagnosed 07/12/2016: Resection followed by stereotactic radiation 07/29/2016 Lymph node from 03/02/2018 biopsy: PDL1+ Pembrolizumab completed 01/22/2020 ------------------------------------------------------------------------------------------------------------------------------------------------ Current treatment: Pembrolizumab to be completed end of October 2021. Pembrolizumab toxicities: Hypothyroidism: On Synthroid 125 mcg daily MRI brain 02/02/2020: Stable posttreatment changes. (Next one 08/02/20)  03/14/20: CT CAP: No evidence of mets  RTC in 3 months with scans and follow up

## 2020-07-07 ENCOUNTER — Inpatient Hospital Stay: Payer: No Typology Code available for payment source

## 2020-07-07 ENCOUNTER — Other Ambulatory Visit: Payer: Self-pay

## 2020-07-07 ENCOUNTER — Telehealth: Payer: Self-pay | Admitting: Hematology and Oncology

## 2020-07-07 ENCOUNTER — Inpatient Hospital Stay: Payer: No Typology Code available for payment source | Attending: Hematology and Oncology

## 2020-07-07 ENCOUNTER — Inpatient Hospital Stay (HOSPITAL_BASED_OUTPATIENT_CLINIC_OR_DEPARTMENT_OTHER): Payer: No Typology Code available for payment source | Admitting: Hematology and Oncology

## 2020-07-07 VITALS — BP 140/90 | HR 67 | Temp 98.8°F | Resp 18 | Ht 63.0 in | Wt 181.1 lb

## 2020-07-07 DIAGNOSIS — C792 Secondary malignant neoplasm of skin: Secondary | ICD-10-CM

## 2020-07-07 DIAGNOSIS — Z923 Personal history of irradiation: Secondary | ICD-10-CM | POA: Insufficient documentation

## 2020-07-07 DIAGNOSIS — R634 Abnormal weight loss: Secondary | ICD-10-CM | POA: Diagnosis not present

## 2020-07-07 DIAGNOSIS — Z79899 Other long term (current) drug therapy: Secondary | ICD-10-CM | POA: Diagnosis not present

## 2020-07-07 DIAGNOSIS — Z9221 Personal history of antineoplastic chemotherapy: Secondary | ICD-10-CM | POA: Diagnosis not present

## 2020-07-07 DIAGNOSIS — Z171 Estrogen receptor negative status [ER-]: Secondary | ICD-10-CM | POA: Diagnosis not present

## 2020-07-07 DIAGNOSIS — E039 Hypothyroidism, unspecified: Secondary | ICD-10-CM | POA: Insufficient documentation

## 2020-07-07 DIAGNOSIS — C50511 Malignant neoplasm of lower-outer quadrant of right female breast: Secondary | ICD-10-CM

## 2020-07-07 LAB — CBC WITH DIFFERENTIAL (CANCER CENTER ONLY)
Abs Immature Granulocytes: 0.01 10*3/uL (ref 0.00–0.07)
Basophils Absolute: 0 10*3/uL (ref 0.0–0.1)
Basophils Relative: 1 %
Eosinophils Absolute: 0.1 10*3/uL (ref 0.0–0.5)
Eosinophils Relative: 1 %
HCT: 39.3 % (ref 36.0–46.0)
Hemoglobin: 13 g/dL (ref 12.0–15.0)
Immature Granulocytes: 0 %
Lymphocytes Relative: 21 %
Lymphs Abs: 1.2 10*3/uL (ref 0.7–4.0)
MCH: 29 pg (ref 26.0–34.0)
MCHC: 33.1 g/dL (ref 30.0–36.0)
MCV: 87.5 fL (ref 80.0–100.0)
Monocytes Absolute: 0.4 10*3/uL (ref 0.1–1.0)
Monocytes Relative: 6 %
Neutro Abs: 4.1 10*3/uL (ref 1.7–7.7)
Neutrophils Relative %: 71 %
Platelet Count: 192 10*3/uL (ref 150–400)
RBC: 4.49 MIL/uL (ref 3.87–5.11)
RDW: 12.3 % (ref 11.5–15.5)
WBC Count: 5.7 10*3/uL (ref 4.0–10.5)
nRBC: 0 % (ref 0.0–0.2)

## 2020-07-07 LAB — CMP (CANCER CENTER ONLY)
ALT: 13 U/L (ref 0–44)
AST: 18 U/L (ref 15–41)
Albumin: 3.5 g/dL (ref 3.5–5.0)
Alkaline Phosphatase: 93 U/L (ref 38–126)
Anion gap: 9 (ref 5–15)
BUN: 13 mg/dL (ref 6–20)
CO2: 24 mmol/L (ref 22–32)
Calcium: 8.7 mg/dL — ABNORMAL LOW (ref 8.9–10.3)
Chloride: 108 mmol/L (ref 98–111)
Creatinine: 0.75 mg/dL (ref 0.44–1.00)
GFR, Estimated: >60 mL/min (ref >60)
Glucose, Bld: 92 mg/dL (ref 70–99)
Potassium: 3.9 mmol/L (ref 3.5–5.1)
Sodium: 141 mmol/L (ref 135–145)
Total Bilirubin: 0.4 mg/dL (ref 0.3–1.2)
Total Protein: 6.9 g/dL (ref 6.5–8.1)

## 2020-07-07 LAB — TSH: TSH: 0.2 u[IU]/mL — ABNORMAL LOW (ref 0.308–3.960)

## 2020-07-07 MED ORDER — SODIUM CHLORIDE 0.9% FLUSH
10.0000 mL | INTRAVENOUS | Status: DC | PRN
  Filled 2020-07-07: qty 10

## 2020-07-07 MED ORDER — HEPARIN SOD (PORK) LOCK FLUSH 100 UNIT/ML IV SOLN
500.0000 [IU] | Freq: Once | INTRAVENOUS | Status: DC | PRN
  Filled 2020-07-07: qty 5

## 2020-07-07 MED ORDER — PLENITY PO CAPS: 3.0000 | ORAL_CAPSULE | Freq: Two times a day (BID) | ORAL | 3 refills | Status: DC

## 2020-07-07 NOTE — Progress Notes (Signed)
Patient had labs drawn peripherally by myself.

## 2020-07-07 NOTE — Telephone Encounter (Signed)
Scheduled appts per 2/14 los. Pt confirmed appt date and time.  

## 2020-07-07 NOTE — Patient Instructions (Addendum)
Previous entry is erroneous; patient does not have implanted port.

## 2020-07-08 ENCOUNTER — Other Ambulatory Visit: Payer: Self-pay | Admitting: Hematology and Oncology

## 2020-07-08 ENCOUNTER — Other Ambulatory Visit: Payer: Self-pay | Admitting: *Deleted

## 2020-07-08 MED ORDER — LEVOTHYROXINE SODIUM 112 MCG PO TABS: 112.0000 ug | ORAL_TABLET | Freq: Every day | ORAL | 0 refills | Status: DC

## 2020-07-08 NOTE — Progress Notes (Signed)
Per MD request based on pt recent TSH of 0.200, pt needing levothyroxine dosage decreased to 112 mcg daily.  prescription for levothyroxine 112 mcg p.o. daily sent to pharmacy on file.  Pt also notified and verbalized understanding.

## 2020-07-09 ENCOUNTER — Other Ambulatory Visit: Payer: Self-pay | Admitting: Hematology and Oncology

## 2020-07-09 NOTE — Telephone Encounter (Signed)
Received message from pharmacy stating this medication is not available.  I called [facility] and [facility] out pt.  I was told there is a Producer, television/film/video and it is on backorder with no expected date to be in stock any time soon.

## 2020-07-18 ENCOUNTER — Other Ambulatory Visit: Payer: Self-pay

## 2020-07-18 ENCOUNTER — Ambulatory Visit (HOSPITAL_COMMUNITY)
Admission: RE | Admit: 2020-07-18 | Discharge: 2020-07-18 | Disposition: A | Discharge status: Home / Self Care | Payer: No Typology Code available for payment source | Source: Ambulatory Visit | Attending: Hematology and Oncology | Admitting: Hematology and Oncology

## 2020-07-18 DIAGNOSIS — Z171 Estrogen receptor negative status [ER-]: Secondary | ICD-10-CM | POA: Diagnosis present

## 2020-07-18 DIAGNOSIS — C792 Secondary malignant neoplasm of skin: Secondary | ICD-10-CM | POA: Diagnosis present

## 2020-07-18 DIAGNOSIS — C50511 Malignant neoplasm of lower-outer quadrant of right female breast: Secondary | ICD-10-CM | POA: Insufficient documentation

## 2020-07-18 MED ORDER — IOHEXOL 300 MG/ML  SOLN
100.0000 mL | Freq: Once | INTRAMUSCULAR | Status: AC | PRN
  Administered 2020-07-18: 100 mL via INTRAVENOUS

## 2020-07-20 NOTE — Progress Notes (Signed)
HEMATOLOGY-ONCOLOGY MYCHART VIDEO VISIT PROGRESS NOTE  I connected with Kaitlyn Keith on 07/21/2020 at 12:00 PM EST by MyChart video conference and verified that I am speaking with the correct person using two identifiers.  I discussed the limitations, risks, security and privacy concerns of performing an evaluation and management service by MyChart and the availability of in person appointments.  I also discussed with the patient that there may be a patient responsible charge related to this service. The patient expressed understanding and agreed to proceed.  Patient's Location: Home Physician Location: Clinic  CHIEF COMPLIANT: Follow-up of metastatic breast cancer  INTERVAL HISTORY: Kaitlyn Keith is a 55 y.o. female with above-mentioned history of metastatic breast cancer with brain metastasis.CT CAP on 07/18/20 showed no evidence of recurrence or metastases. She presents over MyChart todayto review her scan.  She is doing quite well without any problems or concerns.  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%, HER-2 negative ratio 1.43   12/10/2014 Breast MRI   Right breast lower outer quadrant: 2.3 x 2.4 x 2.4 cm rim-enhancing mass abuts the pectoralis fascia but no enhancement of pectoralis muscle, second focus of artifact?'s second tissue marker clip, no lymph nodes   12/10/2014 Clinical Stage   Stage IIA: T2 N0   12/24/2014 - 04/29/2015 Neo-Adjuvant Chemotherapy   Dose dense Adriamycin and Cytoxan 4 followed by weekly Abraxane 12   05/02/2015 Breast MRI   complete radiologic response   06/16/2015 Surgery   Left Lumpectomy: Complete path Response, 0/2 LN   06/16/2015 Pathologic Stage   ypT0 ypN0   07/23/2015  - 09/05/2015 Radiation Therapy   Adjuvant RT: 50.4 Gy in 28 fractions and a boost of 10 Gy in 5 fractions to total dose of 60.4 Gy   10/24/2015 Survivorship   SCP mailed to patient in lieu of in person visit.   07/26/2016 - 07/27/2016 Radiation Therapy    SRS brain   07/27/2016 - 07/29/2016 Hospital Admission   Cerebellar mass: Right suboccipital craniotomy for tumor resection with stereotactic navigation: Metastatic poorly differentiated adenocarcinoma with extensive necrosis positive for CK 7, MOC 31, CK 5/6; Neg for Er/PR, GATA-3, GCDFP CDX2, Napsin A and TTF-1   11/17/2016 PET scan   Subcutaneous nodules in the neck, upper back, left arm, abdomen and pelvis,. 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 in BARD 1, BRCA2 and NF1   12/31/2016 Miscellaneous   Everolimus 10 mg daily for cycle 1 if she cannot tolerate will decrease to 7.5 mg daily   05/24/2017 - 07/04/2017 Chemotherapy   Xeloda 2000 mg 2 weeks on 1 week off stopped due to progression of disease based on CT scans done 06/27/2017   07/25/2017 - 03/13/2018 Chemotherapy   [place] days 1 and 8 every 3 weeks stopped for progression    03/20/2018 - 03/31/2018 Radiation Therapy   Radiation to lymph nodes   04/03/2018 -  Chemotherapy   Keytruda every 3 weeks      Observations/Objective:  There were no vitals filed for this visit. There is no height or weight on file to calculate BMI.  I have reviewed the data as listed CMP Latest Ref Rng & Units 07/07/2020 03/17/2020 02/29/2020  Glucose 70 - 99 mg/dL 92 89 100(H)  BUN 6 - 20 mg/dL _0 Creatinine 0.44 - 1.00 mg/dL 0.75 0.70 0.79  Sodium 135 - 145 mmol/L 141 140  139  Potassium 3.5 - 5.1 mmol/L 3.9 3.4(L) 3.4(L)  Chloride 98 - 111 mmol/L 108 105 100  CO2 22 - 32 mmol/L _1 Calcium 8.9 - 10.3 mg/dL 8.7(L) 9.3 8.7(L)  Total Protein 6.5 - 8.1 g/dL 6.9 7.4 7.2  Total Bilirubin 0.3 - 1.2 mg/dL 0.4 0.7 0.7  Alkaline Phos 38 - 126 U/L 93 103 92  AST 15 - 41 U/L _2 ALT 0 - 44 U/L _3 Lab Results  Component Value Date   WBC 5.7 07/07/2020   HGB 13.0 07/07/2020   HCT 39.3 07/07/2020   MCV 87.5 07/07/2020   PLT 192 07/07/2020   NEUTROABS 4.1 07/07/2020      Assessment Plan:  Breast cancer of lower-outer quadrant of right female breast ([place]) Right breast biopsy 12/03/2014 8:00: Invasive ductal carcinoma, grade 3, ER 0%, PR 0%, 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 Neoadj chemo 12/24/14- 04/29/15 AC x 4 foll by Abraxane X 12 Rt Lumpectomy: Path CR 0/2 LN Adj XRT 07/23/15- 09/08/15 PET/CT scan 11/17/2016:Subcutaneous nodules in the neck, upper back, left arm, abdomen and pelvis Patient progressed on Xeloda January 2019-07/04/2017 stopped due to progression of disease Cerebellar mass diagnosed 07/12/2016: Resection followed by stereotactic radiation 07/29/2016 Lymph node from 03/02/2018 biopsy: PDL1+ Pembrolizumab completed 01/22/2020 ------------------------------------------------------------------------------------------------------------------------------------------------ Current treatment: Pembrolizumab to be completed end of October 2021. Pembrolizumab toxicities: Hypothyroidism: On Synthroid 115 mcg daily MRI brain 02/02/2020: Stable posttreatment changes. (Next one 08/02/20)  03/14/20: CT CAP: No evidence of mets  Chest wall symptoms: Likely musculoskeletal CT CAP 07/20/2020: No evidence of mets.  She now uses Dot[employer] products. Sent a prescription for hydroxyzine for sleep.  She will stop using  Xanax.    I discussed the assessment and treatment plan with the patient. The patient was provided an opportunity to ask questions and all were answered. The patient agreed with the plan and demonstrated an understanding of the instructions. The patient was advised to call back or seek an in-person evaluation if the symptoms worsen or if the condition fails to improve as anticipated.   Total time spent: 20 minutes including face-to-face MyChart video visit time and time spent for planning, charting and coordination of care  Rulon Eisenmenger, MD 07/21/2020   I, Cloyde Reams Dorshimer, am acting as scribe for [name], MD.  I have reviewed the above documentation for accuracy and completeness, and I agree with the above.

## 2020-07-20 NOTE — Assessment & Plan Note (Signed)
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 Neoadj chemo 12/24/14- 04/29/15 AC x 4 foll by Abraxane X 12 Rt Lumpectomy: Path CR 0/2 LN Adj XRT 07/23/15- 09/08/15 PET/CT scan 11/17/2016:Subcutaneous nodules in the neck, upper back, left arm, abdomen and pelvis Patient progressed on Xeloda January 2019-07/04/2017 stopped due to progression of disease Cerebellar mass diagnosed 07/12/2016: Resection followed by stereotactic radiation 07/29/2016 Lymph node from 03/02/2018 biopsy: PDL1+ Pembrolizumab completed 01/22/2020 ------------------------------------------------------------------------------------------------------------------------------------------------ Current treatment: Pembrolizumab to be completed end of October 2021. Pembrolizumab toxicities: Hypothyroidism: On Synthroid 125 mcg daily MRI brain 02/02/2020: Stable posttreatment changes. (Next one 08/02/20)  03/14/20: CT CAP: No evidence of mets  Chest wall symptoms: Likely musculoskeletal CT CAP 07/20/2020: No evidence of mets.  She now uses Doterra products. For weight loss, I sent a prescription for Plenity.

## 2020-07-21 ENCOUNTER — Inpatient Hospital Stay (HOSPITAL_BASED_OUTPATIENT_CLINIC_OR_DEPARTMENT_OTHER): Payer: No Typology Code available for payment source | Admitting: Hematology and Oncology

## 2020-07-21 DIAGNOSIS — C50511 Malignant neoplasm of lower-outer quadrant of right female breast: Secondary | ICD-10-CM | POA: Diagnosis not present

## 2020-07-21 DIAGNOSIS — Z171 Estrogen receptor negative status [ER-]: Secondary | ICD-10-CM | POA: Diagnosis not present

## 2020-07-21 MED ORDER — HYDROXYZINE HCL 25 MG PO TABS: 25.0000 mg | ORAL_TABLET | Freq: Every evening | ORAL | 3 refills | Status: DC | PRN

## 2020-07-23 ENCOUNTER — Telehealth: Payer: Self-pay | Admitting: Hematology and Oncology

## 2020-07-23 NOTE — Telephone Encounter (Signed)
Scheduled per 2/28 los. Called and spoke with pt

## 2020-07-30 ENCOUNTER — Encounter: Payer: Self-pay | Admitting: Urology

## 2020-07-30 ENCOUNTER — Other Ambulatory Visit: Payer: Self-pay

## 2020-07-30 ENCOUNTER — Telehealth: Payer: Self-pay

## 2020-07-30 NOTE — Telephone Encounter (Signed)
Spoke with patient in regards to telephone visit with Freeman Caldron PA on 08/06/20 @ 9:30am. Advised that this is not an in person visit. Patient verbalized understanding of appointment date and time. Reviewed meaningful use questions. TM

## 2020-08-02 ENCOUNTER — Ambulatory Visit
Admission: RE | Admit: 2020-08-02 | Discharge: 2020-08-02 | Disposition: A | Discharge status: Home / Self Care | Payer: No Typology Code available for payment source | Source: Ambulatory Visit | Attending: Radiation Oncology | Admitting: Radiation Oncology

## 2020-08-02 DIAGNOSIS — C7931 Secondary malignant neoplasm of brain: Secondary | ICD-10-CM

## 2020-08-02 MED ORDER — GADOBENATE DIMEGLUMINE 529 MG/ML IV SOLN
15.0000 mL | Freq: Once | INTRAVENOUS | Status: AC | PRN
  Administered 2020-08-02: 15 mL via INTRAVENOUS

## 2020-08-04 ENCOUNTER — Inpatient Hospital Stay: Payer: No Typology Code available for payment source | Attending: Hematology and Oncology

## 2020-08-06 ENCOUNTER — Ambulatory Visit
Admission: RE | Admit: 2020-08-06 | Discharge: 2020-08-06 | Disposition: A | Discharge status: Home / Self Care | Payer: No Typology Code available for payment source | Source: Ambulatory Visit | Attending: Urology | Admitting: Urology

## 2020-08-06 ENCOUNTER — Other Ambulatory Visit: Payer: Self-pay

## 2020-08-06 DIAGNOSIS — C7931 Secondary malignant neoplasm of brain: Secondary | ICD-10-CM

## 2020-08-06 NOTE — Progress Notes (Signed)
Radiation Oncology         [phone]) [phone] ________________________________  Name: Kaitlyn Keith MRN: [id number]  Date: 08/06/2020  DOB: Sep 22, 1965  Follow-Up Visit Note  CC: Patient, [name]  [name], MD  Diagnosis:    55 y.o. 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      ICD-10-CM   1. Secondary malignant neoplasm of brain and spinal cord (HCC)  C79.31    C79.49   2. 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 Pelvis / 30 Gy in 10 fractions (right inguinal nodes) 2. Left Supraclavicular / 30 Gy in 10 fractions 3. Abdomen / 20 Gy in 8 fractions (subcutaneous Met)  07/11/2017 - 07/22/2017 palliative XRT for painful subcutaneous nodules ([place]) 1. Left Flank / 30 Gy in 10 fractions 2. Left Groin / 30 Gy in 10 fractions  07/26/16 Preop SRS Treatment Tamm[facility]lipp[facility]): PTV1 Right Cerebellum was treated to 18 Gy in 1 fraction  07/23/15-09/05/15 ([place]): 50.4 Gy to the right breast + 10 Gy boost  Narrative:  I spoke with the patient to conduct her routine scheduled 6 month follow up visit to review recent MRI brain via telephone to spare the patient unnecessary potential exposure in the healthcare setting during the current COVID-19 pandemic.  The patient was notified in advance and gave permission to proceed with this visit format.  Kaitlyn Keith is a pleasant 55 y.o. 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 She was diagnosed with her cancer in July 2016, and underwent neoadjuvant chemotherapy followed by right lumpectomy and adjuvant radiation. She was found to have recurrent disease in February 2018  with a solitary right cerebellar mass, and underwent preoperative SRS treatment followed by surgical resection on 07/29/2016.   She developed multiple subcutaneous nodules in the neck, upper back, left arm, abdomen and pelvis, noted on PET scan from 11/17/16. She was started on Everolimus on 12/31/16 but was discontinued 04/11/17 due to disease progression with interval development of subcutaneous nodules in the ventral lower left pelvic wall and left flank and interval growth of 3 scattered peritoneal metastases. She started Xeloda in 05/2017 but discontinued due to disease progression on re-staging scans from 06/27/17 which showed a mixed response to therapy with some regression of the previously noted intraperitoneal implants but other lesions with significant interval growth including subcutaneous nodules in the left flank, left vulvar region, right inguinal LAN and anterior mediastinal LAN. She elected to move forward with palliative XRT to 2 painful subcutaneous lesions in the left flank and left pubic/vulvar region which was completed in March 2019.  She tolerated radiotherapy very well and had an excellent response with decreased size of both treated lesions.  Her systemic chemotherapy was switched to [facility] but this was discontinued in October 2019 due to evidence of disease progression on follow-up CT C/A/P from 02/02/2018 indicating continued progression of right inguinal lymph node and right inguinal lymphadenopathy.  She underwent a CT-guided biopsy of the right inguinal lymph node on 03/02/2018 with final pathology confirming metastatic, poorly differentiated carcinoma, ER/PR negative and HER-2 negative.  Her systemic therapy was changed to pembrolizumab immunotherapy at that  time and she was also referred back to radiation oncology for consideration of palliative radiotherapy to the painful, progressive right inguinal lymphadenopathy.  At the time of her consult on 03/15/2018, she also mentioned that she  had recently developed pain in the left supraclavicular region as well as in the left flank area.  She elected to proceed with palliative radiotherapy to the 3 sites of painful metastatic disease in the right inguinal, left supraclavicular and left abdomen/flank and this was completed on 03/31/2018.  She tolerated the radiation well and did have significant improvement in her pain.  She has now completed 28 cycles of Keytruda (pembrolizumab) and her most recent CT C/A/P from 12/14/19 showed complete resolution of metastatic focus in the right inguinal/groin region as well as complete resolution of the left supraclavicular lymphadenopathy. Stable 6 mm hypodense lesion in the liver. No current evidence of active malignancy in the chest, abdomen or pelvis. She has continued in routine follow up with Dr. Lindi Adie, last seen on 07/20/20 and the recommendation is to continue in observation as her maintenance immunotherapy with Keytruda every 3 weeks was completed 03/17/2020 after the 30th cycle.  She has also continued being followed in our multidisciplinary brain tumor conference, and her last MRI scan on 312/22 was recently reviewed in brain tumor board on Monday 08/04/20 and revealed a stable 3 mm enhancing nodule in the right lateral cerebellum at the site of previous tumor treatment, felt to be consistent with treatment related changes especially in light of its stable appearance over multiple scans since late 2020. There were no other enhancing lesions. Today's visit is to review these findings.  On review of systems, the patient reports that she is doing well overall. She denies any chest pain, shortness of breath, cough, fevers, chills, night sweats, or unintended weight changes. She denies any bowel or bladder disturbances, and denies abdominal pain, nausea or vomiting. She denies new visual or auditory changes, dizziness, imbalance, tremors or seizure activity. She has continued having occasional headaches which  respond to tylenol or advil if needed.  The headaches became severe back in 10/2018 and she ended up having a wisdom tooth extraction with significant improvement of her HAs. She has some chronic pain in the left groin/hip joint region that radiates down her left leg anteriorly to her foot.  This pain is exacerbated with activities and improves with rest and does not wake her from sleep at night. An MRI of the hip in October 2020 was without evidence of bony metastatic disease or acute abnormalities.  She reports mild improvement with taking muscle relaxants prn. She denies any new skin lesions or concerns. A complete review of systems is obtained and is otherwise negative.  Past Medical History:  Past Medical History:  Diagnosis Date  . Anxiety   . Arthritis    back  . Back pain   . Brain cancer ([place])    brian met from triple negative breast ca  . Breast cancer ([place])   . Breast cancer of lower-outer quadrant of right female breast ([place]) 12/05/2014  . Cancer of right breast metastatic to brain [facility])   . Depression   . FH: chemotherapy 12/2014-04/2015  . Genetic testing 12/10/2016   Kaitlyn Keith underwent genetic counseling and testing for hereditary cancer syndromes on 11/18/2016. 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 Mutations in MSH6 are associated with a hereditary cancer syndrome called Lynch syndrome. For more detailed discussion, please see genetic counseling documentation  from 12/10/2016.  Testing was perfo  . Headache    due to brain cancer, no longer having them  . Hot flashes   . Hypertension   . Hypothyroidism   . 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 Mutations in MSH6 are associated with a hereditary cancer syndrome  called Lynch syndrome. For more detailed discussion, please see genetic counseling documentation from 12/10/2016.  Testing was perfo  . Radiation 07/23/15-09/05/15   right breast 50.4 Gy, boost of 10 Gy    Past Surgical History: Past Surgical History:  Procedure Laterality Date  . APPLICATION OF CRANIAL NAVIGATION Right 07/27/2016   Procedure: APPLICATION OF CRANIAL NAVIGATION;  Surgeon: Kevan Ny Ditty, MD;  Location: [place];  Service: Neurosurgery;  Laterality: Right;  . BIOPSY OF SKIN SUBCUTANEOUS TISSUE AND/OR MUCOUS MEMBRANE Left 12/24/2016   Procedure: OPEN BIOPSY LESIONS ON LEFT LOWER BACK AND SHOULDER BLADE;  Surgeon: [name], MD;  Location: [place];  Service: General;  Laterality: Left;  . BREAST LUMPECTOMY WITH NEEDLE LOCALIZATION AND AXILLARY SENTINEL LYMPH NODE BX Right 06/16/2015   Procedure: BREAST LUMPECTOMY WITH NEEDLE LOCALIZATION AND AXILLARY SENTINEL LYMPH NODE BX;  Surgeon: [name], MD;  Location: [place];  Service: General;  Laterality: Right;  . BREAST REDUCTION SURGERY    . CRANIOTOMY Right 07/27/2016   Procedure: Right Suboccipital craniotomy for tumor resection with stereotactic navigation;  Surgeon: Kevan Ny Ditty, MD;  Location: [place];  Service: Neurosurgery;  Laterality: Right;  . PORT-A-CATH REMOVAL N/A 06/16/2015   Procedure: REMOVAL PORT-A-CATH;  Surgeon: [name], MD;  Location: [place];  Service: General;  Laterality: N/A;  . PORT-A-CATH REMOVAL Left 04/21/2020   Procedure: REMOVAL PORT-A-CATH;  Surgeon: [name], MD;  Location: [place];  Service: General;  Laterality: Left;  . PORTACATH PLACEMENT N/A 12/23/2014   Procedure: INSERTION PORT-A-CATH;  Surgeon: [name], MD;  Location: [place];  Service: General;  Laterality: N/A;  . PORTACATH PLACEMENT Left 07/08/2017   Procedure: INSERTION PORT-A-CATH;  Surgeon: [name], MD;  Location: [place];   Service: General;  Laterality: Left;    Social History:  Social History   Socioeconomic History  . Marital status: Divorced    Spouse name: Not on file  . Number of children: 1  . Years of education: Not on file  . Highest education level: Not on file  Occupational History  . Not on file  Tobacco Use  . Smoking status: Never Smoker  . Smokeless tobacco: Never Used  Vaping Use  . Vaping Use: Never used  Substance and Sexual Activity  . Alcohol use: Yes    Comment: social  . Drug use: No  . Sexual activity: Yes    Birth control/protection: None  Other Topics Concern  . Not on file  Social History Narrative  . Not on file   Social Determinants of Health   Financial Resource Strain: Not on file  Food Insecurity: Not on file  Transportation Needs: Not on file  Physical Activity: Not on file  Stress: Not on file  Social Connections: Not on file  Intimate Partner Violence: Not on file  The patient is divorced. She has worked for [employer], and continues working from home full time.  Family History:  Family History  Problem Relation Age of Onset  . Aneurysm Mother 17       d.55  . Heart attack Father 43       d.62  . Endometrial cancer Sister 81  . Lung cancer Maternal Uncle        d.68s  . Cancer Maternal Grandmother        unspecified type-possibly stomach d.88s                          ALLERGIES:  has No Known Allergies.  Meds: Current Outpatient Medications  Medication Sig Dispense Refill  . alprazolam (XANAX) 2 MG tablet Take 1 tablet (2 mg total) by mouth at bedtime. 30 tablet 3  . hydrOXYzine (ATARAX/VISTARIL) 25 MG tablet Take 1 tablet (25 mg total) by mouth at bedtime as needed for anxiety. 30 tablet 3  . levothyroxine (SYNTHROID) 112 MCG tablet Take 1 tablet (112 mcg total) by mouth daily before breakfast. 90 tablet 0  . lisinopril-hydrochlorothiazide (ZESTORETIC) 20-12.5 MG tablet TAKE 1 TABLET BY MOUTH TWICE A DAY 180 tablet 3  .  Carboxymeth-Cellulose-CitricAc (PLENITY) CAPS TAKE 3 CAPSULES BY MOUTH IN THE MORNING AND AT BEDTIME. (Patient not taking: Reported on 07/30/2020) 180 capsule 3   No current facility-administered medications for this encounter.    Physical Findings:   vitals were not taken for this visit.    Unable to assess due to telephone visit format.   Lab Findings: Lab Results  Component Value Date   WBC 5.7 07/07/2020   WBC 3.0 (L) 03/02/2018   HGB 13.0 07/07/2020   HGB 11.7 04/04/2017   HCT 39.3 07/07/2020   HCT 35.9 04/04/2017   PLT 192 07/07/2020   PLT 207 04/04/2017    Lab Results  Component Value Date   NA 141 07/07/2020   NA 139 04/04/2017   K 3.9 07/07/2020   K 3.7 04/04/2017   CHLORIDE 108 04/04/2017   CO2 24 07/07/2020   CO2 24 04/04/2017   GLUCOSE 92 07/07/2020   GLUCOSE 76 04/04/2017   BUN 13 07/07/2020   BUN 5.8 (L) 04/04/2017   CREATININE 0.75 07/07/2020   CREATININE 0.8 04/04/2017   BILITOT 0.4 07/07/2020   BILITOT 0.43 04/04/2017   ALKPHOS 93 07/07/2020   ALKPHOS 131 04/04/2017   AST 18 07/07/2020   AST 18 04/04/2017   ALT 13 07/07/2020   ALT 12 04/04/2017   PROT 6.9 07/07/2020   PROT 7.0 04/04/2017   ALBUMIN 3.5 07/07/2020   ALBUMIN 3.0 (L) 04/04/2017   CALCIUM 8.7 (L) 07/07/2020   CALCIUM 8.6 04/04/2017   ANIONGAP 9 07/07/2020    Radiographic Findings: MR Brain W Wo Contrast  Result Date: 08/03/2020 CLINICAL DATA:  55 year old female with history of metastatic breast cancer status post preoperative SRS in March 2018 to the right cerebellum followed by resection. Small 3 mm focus of recurrent enhancement there beginning in 2020, but subsequently increased by only 1 mm through September 2021. Restaging. EXAM: MRI HEAD WITHOUT AND WITH CONTRAST TECHNIQUE: Multiplanar, multiecho pulse sequences of the brain and surrounding structures were obtained without and with intravenous contrast. CONTRAST:  43m MULTIHANCE GADOBENATE DIMEGLUMINE 529 MG/ML IV SOLN  COMPARISON:  Brain MRI 02/02/2020 and earlier. FINDINGS: Brain: Dark blood postcontrast T1 space images today. Crescentic area of enhancement within the small area of post treatment right cerebellar encephalomalacia on series 11, image 30 is stable compared to postcontrast images since late 2020 (and possibly decreased  since that time when comparing sagittal postcontrast - series 14, image 10). Regional T2 and FLAIR hyperintensity is unchanged and without mass effect. Minimal regional hemosiderin is stable. No other abnormal intracranial enhancement.  No dural thickening. No superimposed restricted diffusion to suggest acute infarction. No midline shift, mass effect, ventriculomegaly, extra-axial collection or acute intracranial hemorrhage. Cervicomedullary junction and pituitary are within normal limits. Outside of the right cerebellar treatment area gray and white matter signal remains stable and normal for age. Vascular: Major intracranial vascular flow voids are stable. Skull and upper cervical spine: Stable visible cervical spine since 2020. Stable visible bone marrow signal, no enhancing or destructive osseous lesion identified. Sinuses/Orbits: Stable, negative. Other: Mastoids remain clear. Visible internal auditory structures appear normal. Stable scalp and face soft tissues. IMPRESSION: Continued stable post treatment appearance of the right cerebellar hemisphere since late 2020. No new metastatic disease. No new intracranial abnormality. Electronically Signed   By: Genevie Ann M.D.   On: 08/03/2020 08:59   CT CHEST ABDOMEN PELVIS W CONTRAST  Result Date: 07/20/2020 CLINICAL DATA:  Breast cancer restaging. EXAM: CT CHEST, ABDOMEN, AND PELVIS WITH CONTRAST TECHNIQUE: Multidetector CT imaging of the chest, abdomen and pelvis was performed following the standard protocol during bolus administration of intravenous contrast. CONTRAST:  167m OMNIPAQUE IOHEXOL 300 MG/ML  SOLN COMPARISON:  03/14/2020 FINDINGS: CT  CHEST FINDINGS Cardiovascular: The heart size appears within normal limits. No pericardial effusion identified. Aortic atherosclerosis. Mediastinum/Nodes: No enlarged mediastinal, hilar, or axillary lymph nodes. Status post right axillary node dissection. Thyroid gland, trachea, and esophagus demonstrate no significant findings. Lungs/Pleura: No pleural effusion, airspace consolidation, or atelectasis. No suspicious lung nodules. Musculoskeletal: No chest wall mass or suspicious bone lesions identified. CT ABDOMEN PELVIS FINDINGS Hepatobiliary: No focal liver abnormality is seen. No gallstones, gallbladder wall thickening, or biliary dilatation. Pancreas: Unremarkable. No pancreatic ductal dilatation or surrounding inflammatory changes. Spleen: Normal in size without focal abnormality. Adrenals/Urinary Tract: Adrenal glands are unremarkable. Kidneys are normal, without renal calculi, focal lesion, or hydronephrosis. Bladder is unremarkable. Stomach/Bowel: Stomach is normal. No bowel wall thickening, inflammation or distension. Vascular/Lymphatic: Mild aortic atherosclerosis. No abdominopelvic adenopathy. Reproductive: Uterus and bilateral adnexa are unremarkable. Other: No abdominal wall hernia or abnormality. No abdominopelvic ascites. Musculoskeletal: No acute or significant osseous findings. Anterolisthesis of L4 on L5 is identified. IMPRESSION: 1. No acute findings within the chest, abdomen or pelvis. No specific findings identified to tumor recurrence or metastatic disease. 2. Status post right axillary node dissection. Aortic Atherosclerosis (ICD10-I70.0). Electronically Signed   By: TKerby MoorsM.D.   On: 07/20/2020 08:38    Impression/Plan: 1. 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  Her most recent MRI brain from 08/02/20 was recently reviewed with the multidisciplinary tumor board on Monday 08/04/20  and revealed stability of the 3 mm enhancing nodule in the right lateral cerebellum at the site of previous tumor treatment, felt to be consistent with treatment related changes, especially given the stable appearance over multiple scans since late 2020. There were no other enhancing lesions. The recommendation is to continue with surveillance MRI every 6 months and a follow up visit thereafter to review results and recommendations from tumor board. Her most recent systemic imaging from 07/18/20 indicates a positive response to systemic treatment and palliative radiotherapy.  There were no acute findings within the chest, abdomen or pelvis and no specific findings identified to suggest tumor recurrence or metastatic  disease.  She met with her medical oncologist, Dr. Lindi Adie in follow-up on 07/20/20 and the recommendation is to continue in observation since she has completed her maintenance immunotherapy with Keytruda q 3 weeks to complete a total of 30 cycles, completed in October 2021. She will continue with repeat CT scans for systemic disease restaging every 6 months under the care and direction of Dr. Lindi Adie. She knows to call us with any questions or concerns in the interim and is comfortable and in agreement with this plan.  I spent 25 minutes in telephone conversation with the patient and more than 50% of that time was spent in counseling and/or coordination of care.    Nicholos Johns, PA-C

## 2020-08-12 ENCOUNTER — Other Ambulatory Visit: Payer: Self-pay | Admitting: Hematology and Oncology

## 2020-10-04 ENCOUNTER — Other Ambulatory Visit: Payer: Self-pay | Admitting: Hematology and Oncology

## 2020-10-04 DIAGNOSIS — C50511 Malignant neoplasm of lower-outer quadrant of right female breast: Secondary | ICD-10-CM

## 2020-10-05 ENCOUNTER — Other Ambulatory Visit: Payer: Self-pay | Admitting: Hematology and Oncology

## 2020-10-22 ENCOUNTER — Other Ambulatory Visit: Payer: Self-pay | Admitting: Radiation Therapy

## 2020-10-22 DIAGNOSIS — C7931 Secondary malignant neoplasm of brain: Secondary | ICD-10-CM

## 2020-11-27 ENCOUNTER — Telehealth: Payer: Self-pay | Admitting: Emergency Medicine

## 2020-11-27 ENCOUNTER — Telehealth: Payer: Self-pay

## 2020-11-27 NOTE — Telephone Encounter (Signed)
Patient called to report increase in tingling/soreness to fingertips, bilaterally.    Patient with history of chemo induced neuropathy.  Previously on Gabapentin however was unable to tolerate due to causing extreme nausea/sickness.   Patient is able to pick up objects.  No issues with performing task such as buttons, or tying shoes.   Pt requesting to try different medication.  RN will review with MD for further recommendations.

## 2020-11-27 NOTE — Telephone Encounter (Signed)
[facility]-[id number] - TREATMENT OF ESTABLISHED CHEMOTHERAPY-INDUCED NEUROPATHY WITH N-PALMITOYLETHANOLAMIDE, A CANNABIMIMETIC NUTRACEUTICAL: A RANDOMIZED DOUBLE-BLIND PHASE II PILOT TRIAL  Called to introduce this study to the patient.  This patient was referred to this study by Dr. Lindi Adie.  The study procedures were explained to the patient over the phone including randomization and use of placebo.  Patient was advised that determination of eligibility for the study may require labwork and office visit with Dr. Lindi Adie.  Patient was sent the informed consent document via secure email at her request.  The patient was advised to call this research coordinator with any questions.   Will follow up with patient next week to follow up concerning interest in the study.  Clabe Seal Clinical Research Coordinator I  11/27/20  3:06 PM

## 2020-11-27 NOTE — Telephone Encounter (Signed)
MD recommendations for patient to speak with research regarding neuropathy trial.    RN spoke with patient, verbalized she was interested.  Research team notified.  Patient aware she will be contacted regarding trial.  No further needs at this time.

## 2020-12-16 ENCOUNTER — Telehealth: Payer: Self-pay | Admitting: Emergency Medicine

## 2020-12-16 NOTE — Telephone Encounter (Signed)
[facility]CRU-[place]-[id number] - TREATMENT OF ESTABLISHED CHEMOTHERAPY-INDUCED NEUROPATHY WITH N-PALMITOYLETHANOLAMIDE, A CANNABIMIMETIC NUTRACEUTICAL: A RANDOMIZED DOUBLE-BLIND PHASE II PILOT TRIAL  12/16/20  3:33 PM Called to follow up concerning interest in this study.  Answered questions concerning the study investigational agent, palmitoylenthanolamide (PEA).  Patient states her symptoms have improved some and she is no longer interested in participating in this study.  Patient was advised to call with any questions or if she changes her mind in the future.  Clabe Seal Clinical Research Coordinator I  12/16/20  3:56 PM

## 2021-01-17 ENCOUNTER — Other Ambulatory Visit: Payer: Self-pay | Admitting: Hematology and Oncology

## 2021-01-17 NOTE — Progress Notes (Signed)
HEMATOLOGY-ONCOLOGY [facility] VIDEO VISIT PROGRESS NOTE  I connected with Kaitlyn Keith on 01/19/2021 at  8:30 AM EDT by MyChart video conference and verified that I am speaking with the correct person using two identifiers.  I discussed the limitations, risks, security and privacy concerns of performing an evaluation and management service by MyChart and the availability of in person appointments.  I also discussed with the patient that there may be a patient responsible charge related to this service. The patient expressed understanding and agreed to proceed.  Patient's Location: Home Physician Location: Clinic  CHIEF COMPLIANT:  Follow-up of metastatic breast cancer   INTERVAL HISTORY: Kaitlyn Keith is a 55 y.o. female with above-mentioned history of metastatic breast cancer with brain metastasis. CT CAP on 07/18/20 showed no local recurrence or metastases. She presents to via [place] today for follow-up.  She reports no new problems or concerns.  She has some musculoskeletal aches and pains.  She tells me that she got married few months ago and sold her previous home and moved to a new home and is very excited about that.  She is also excited about going to [place] in December to [place] for a family event.  Oncology History  Breast cancer of lower-outer quadrant of right female breast ([place])  12/03/2014 Mammogram   Right breast mass 1.9 cm it o'clock position 8 cm depth from the nipple   12/03/2014 Initial Diagnosis   Right breast biopsy 8:00: Invasive ductal carcinoma, grade 3, ER 0%, PR 0%, Ki-67 90%, HER-2 negative ratio 1.43   12/10/2014 Breast MRI   Right breast lower outer quadrant: 2.3 x 2.4 x 2.4 cm rim-enhancing mass abuts the pectoralis fascia but no enhancement of pectoralis muscle, second focus of artifact?'s second tissue marker clip, no lymph nodes   12/10/2014 Clinical Stage   Stage IIA: T2 N0   12/24/2014 - 04/29/2015 Neo-Adjuvant Chemotherapy   Dose dense  Adriamycin and Cytoxan 4 followed by weekly Abraxane 12   05/02/2015 Breast MRI   complete radiologic response   06/16/2015 Surgery   Left Lumpectomy: Complete path Response, 0/2 LN   06/16/2015 Pathologic Stage   ypT0 ypN0   07/23/2015 - 09/05/2015 Radiation Therapy   Adjuvant RT: 50.4 Gy in 28 fractions and a boost of 10 Gy in 5 fractions to total dose of 60.4 Gy   10/24/2015 Survivorship   SCP mailed to patient in lieu of in person visit.   07/26/2016 - 07/27/2016 Radiation Therapy    SRS brain   07/27/2016 - 07/29/2016 Hospital Admission   Cerebellar mass: Right suboccipital craniotomy for tumor resection with stereotactic navigation: Metastatic poorly differentiated adenocarcinoma with extensive necrosis positive for CK 7, MOC 31, CK 5/6; Neg for Er/PR, GATA-3, GCDFP CDX2, Napsin A and TTF-1   11/17/2016 PET scan   Subcutaneous nodules in the neck, upper back, left arm, abdomen and pelvis,. 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 in BARD 1, BRCA2 and NF1   12/31/2016 Miscellaneous   Everolimus 10 mg daily for cycle 1 if she cannot tolerate will decrease to 7.5 mg daily   05/24/2017 - 07/04/2017 Chemotherapy   Xeloda 2000 mg 2 weeks on 1 week off stopped due to progression of disease based on CT scans  done 06/27/2017   07/25/2017 - 03/13/2018 Chemotherapy   Halaven days 1 and 8 every 3 weeks stopped for progression    03/20/2018 - 03/31/2018 Radiation Therapy   Radiation to lymph nodes   04/03/2018 -  Chemotherapy   Keytruda every 3 weeks      Observations/Objective:  There were no vitals filed for this visit. There is no height or weight on file to calculate  BMI.  I have reviewed the data as listed CMP Latest Ref Rng & Units 07/07/2020 03/17/2020 02/29/2020  Glucose 70 - 99 mg/dL 92 89 100(H)  BUN 6 - 20 mg/dL '13 12 10  ' Creatinine 0.44 - 1.00 mg/dL 0.75 0.70 0.79  Sodium 135 - 145 mmol/L 141 140 139  Potassium 3.5 - 5.1 mmol/L 3.9 3.4(L) 3.4(L)  Chloride 98 - 111 mmol/L 108 105 100  CO2 22 - 32 mmol/L '24 27 27  ' Calcium 8.9 - 10.3 mg/dL 8.7(L) 9.3 8.7(L)  Total Protein 6.5 - 8.1 g/dL 6.9 7.4 7.2  Total Bilirubin 0.3 - 1.2 mg/dL 0.4 0.7 0.7  Alkaline Phos 38 - 126 U/L 93 103 92  AST 15 - 41 U/L '18 18 17  ' ALT 0 - 44 U/L '13 12 13    ' Lab Results  Component Value Date   WBC 5.7 07/07/2020   HGB 13.0 07/07/2020   HCT 39.3 07/07/2020   MCV 87.5 07/07/2020   PLT 192 07/07/2020   NEUTROABS 4.1 07/07/2020      Assessment Plan:  Breast cancer of lower-outer quadrant of right female breast ([place]) Right breast biopsy 12/03/2014 8:00: Invasive ductal carcinoma, grade 3, ER 0%, PR 0%, 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 Neoadj chemo 12/24/14- 04/29/15 AC x 4 foll by Abraxane X 12 Rt Lumpectomy: Path CR 0/2 LN Adj XRT 07/23/15- 09/08/15 PET/CT scan 11/17/2016:Subcutaneous nodules in the neck, upper back, left arm, abdomen and pelvis Patient progressed on Xeloda  January 2019-07/04/2017 stopped due to progression of disease Cerebellar mass diagnosed 07/12/2016: Resection followed by stereotactic radiation 07/29/2016 Lymph node from 03/02/2018 biopsy: PDL1+ Pembrolizumab completed 01/22/2020 ------------------------------------------------------------------------------------------------------------------------------------------------ Current treatment: Pembrolizumab completed end of October 2021. Pembrolizumab toxicities: Hypothyroidism: On Synthroid 115 mcg daily MRI brain 02/02/2020: Stable posttreatment changes. (Next one 08/02/20)     Chest wall symptoms: Likely musculoskeletal CT CAP 07/20/2020: No evidence of mets.   She now uses [employer] products.    CT CAP and brain MRI scheduled for September. Going to [place] in December 2022 (ceremony) Virtual visit 02/02/21.   I discussed the assessment and treatment plan with the patient. The patient was provided an opportunity to ask questions and all were answered. The patient agreed with the plan and demonstrated an understanding of the instructions. The patient was advised to call back or seek an in-person evaluation if the symptoms worsen or if the condition fails to improve as anticipated.   Total time spent: 20 minutes including face-to-face MyChart video visit time and time spent for planning, charting and coordination of care  Rulon Eisenmenger, MD 01/19/2021  I, Thana Ates am acting as scribe for [name], MD.  I have reviewed the above documentation for accuracy and completeness, and I agree with the above.

## 2021-01-19 ENCOUNTER — Inpatient Hospital Stay
Payer: No Typology Code available for payment source | Attending: Hematology and Oncology | Admitting: Hematology and Oncology

## 2021-01-19 ENCOUNTER — Encounter: Payer: Self-pay | Admitting: Hematology and Oncology

## 2021-01-19 DIAGNOSIS — C50511 Malignant neoplasm of lower-outer quadrant of right female breast: Secondary | ICD-10-CM

## 2021-01-19 DIAGNOSIS — Z171 Estrogen receptor negative status [ER-]: Secondary | ICD-10-CM

## 2021-01-19 NOTE — Assessment & Plan Note (Signed)
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 Neoadj chemo 12/24/14- 04/29/15 AC x 4 foll by Abraxane X 12 Rt Lumpectomy: Path CR 0/2 LN Adj XRT 07/23/15- 09/08/15 PET/CT scan 11/17/2016:Subcutaneous nodules in the neck, upper back, left arm, abdomen and pelvis Patient progressed on Xeloda January 2019-07/04/2017 stopped due to progression of disease Cerebellar mass diagnosed 07/12/2016: Resection followed by stereotactic radiation 07/29/2016 Lymph node from 03/02/2018 biopsy: PDL1+ Pembrolizumab completed 01/22/2020 ------------------------------------------------------------------------------------------------------------------------------------------------ Current treatment:Pembrolizumab to be completed end of October 2021. Pembrolizumab toxicities: Hypothyroidism: On Synthroid 115 mcg daily MRI brain 02/02/2020: Stable posttreatment changes.(Next one 08/02/20)  03/14/20: CT CAP: No evidence of mets  Chest wall symptoms: Likely musculoskeletal CT CAP 07/20/2020: No evidence of mets.  She now uses [employer] products. Sent a prescription for hydroxyzine for sleep.  She will stop using Xanax.  CT CAP and brain MRI scheduled for September. Virtual visit after that to discuss results. 

## 2021-01-30 ENCOUNTER — Other Ambulatory Visit: Payer: Self-pay

## 2021-01-30 ENCOUNTER — Inpatient Hospital Stay: Payer: No Typology Code available for payment source | Attending: Hematology and Oncology

## 2021-01-30 ENCOUNTER — Ambulatory Visit (HOSPITAL_COMMUNITY)
Admission: RE | Admit: 2021-01-30 | Discharge: 2021-01-30 | Disposition: A | Discharge status: Home / Self Care | Payer: No Typology Code available for payment source | Source: Ambulatory Visit | Attending: Hematology and Oncology | Admitting: Hematology and Oncology

## 2021-01-30 DIAGNOSIS — Z171 Estrogen receptor negative status [ER-]: Secondary | ICD-10-CM | POA: Insufficient documentation

## 2021-01-30 DIAGNOSIS — C50511 Malignant neoplasm of lower-outer quadrant of right female breast: Secondary | ICD-10-CM

## 2021-01-30 LAB — CBC WITH DIFFERENTIAL (CANCER CENTER ONLY)
Abs Immature Granulocytes: 0.01 10*3/uL (ref 0.00–0.07)
Basophils Absolute: 0 10*3/uL (ref 0.0–0.1)
Basophils Relative: 1 %
Eosinophils Absolute: 0.1 10*3/uL (ref 0.0–0.5)
Eosinophils Relative: 1 %
HCT: 39.6 % (ref 36.0–46.0)
Hemoglobin: 13.4 g/dL (ref 12.0–15.0)
Immature Granulocytes: 0 %
Lymphocytes Relative: 34 %
Lymphs Abs: 1.4 10*3/uL (ref 0.7–4.0)
MCH: 29.7 pg (ref 26.0–34.0)
MCHC: 33.8 g/dL (ref 30.0–36.0)
MCV: 87.8 fL (ref 80.0–100.0)
Monocytes Absolute: 0.4 10*3/uL (ref 0.1–1.0)
Monocytes Relative: 9 %
Neutro Abs: 2.4 10*3/uL (ref 1.7–7.7)
Neutrophils Relative %: 55 %
Platelet Count: 187 10*3/uL (ref 150–400)
RBC: 4.51 MIL/uL (ref 3.87–5.11)
RDW: 12.6 % (ref 11.5–15.5)
WBC Count: 4.3 10*3/uL (ref 4.0–10.5)
nRBC: 0 % (ref 0.0–0.2)

## 2021-01-30 LAB — CMP (CANCER CENTER ONLY)
ALT: 8 U/L (ref 0–44)
AST: 15 U/L (ref 15–41)
Albumin: 3.8 g/dL (ref 3.5–5.0)
Alkaline Phosphatase: 89 U/L (ref 38–126)
Anion gap: 10 (ref 5–15)
BUN: 12 mg/dL (ref 6–20)
CO2: 25 mmol/L (ref 22–32)
Calcium: 9.3 mg/dL (ref 8.9–10.3)
Chloride: 107 mmol/L (ref 98–111)
Creatinine: 0.96 mg/dL (ref 0.44–1.00)
GFR, Estimated: >60 mL/min (ref >60)
Glucose, Bld: 95 mg/dL (ref 70–99)
Potassium: 3.6 mmol/L (ref 3.5–5.1)
Sodium: 142 mmol/L (ref 135–145)
Total Bilirubin: 0.7 mg/dL (ref 0.3–1.2)
Total Protein: 7.2 g/dL (ref 6.5–8.1)

## 2021-01-30 MED ORDER — IOHEXOL 350 MG/ML SOLN
100.0000 mL | Freq: Once | INTRAVENOUS | Status: AC | PRN
  Administered 2021-01-30: 80 mL via INTRAVENOUS

## 2021-01-31 ENCOUNTER — Ambulatory Visit
Admission: RE | Admit: 2021-01-31 | Discharge: 2021-01-31 | Disposition: A | Discharge status: Home / Self Care | Payer: No Typology Code available for payment source | Source: Ambulatory Visit | Attending: Radiation Oncology | Admitting: Radiation Oncology

## 2021-01-31 DIAGNOSIS — C7931 Secondary malignant neoplasm of brain: Secondary | ICD-10-CM

## 2021-01-31 LAB — THYROID PANEL WITH TSH
Free Thyroxine Index: 3.9 (ref 1.2–4.9)
T3 Uptake Ratio: 33 % (ref 24–39)
T4, Total: 11.9 ug/dL (ref 4.5–12.0)
TSH: 0.203 u[IU]/mL — ABNORMAL LOW (ref 0.450–4.500)

## 2021-01-31 MED ORDER — GADOBENATE DIMEGLUMINE 529 MG/ML IV SOLN
17.0000 mL | Freq: Once | INTRAVENOUS | Status: AC | PRN
  Administered 2021-01-31: 17 mL via INTRAVENOUS

## 2021-01-31 NOTE — Progress Notes (Signed)
Patient Care Team: Patient, [name] as PCP - General (General Practice) Kaitlyn Kussmaul, MD as Consulting Physician (General Surgery) Kaitlyn Lose, MD as Consulting Physician (Hematology and Oncology) Kaitlyn Pray, MD as Consulting Physician (Radiation Oncology) Kaitlyn Kaufmann, RN as Registered Nurse Kaitlyn Germany, [name], NP (Inactive) as Nurse Practitioner (Nurse Practitioner)  DIAGNOSIS:    ICD-10-CM   1. 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, estrogen receptor negative ([place])  C50.511    Z17.1       SUMMARY OF ONCOLOGIC HISTORY: Oncology History  Breast cancer of lower-outer quadrant of right female breast ([place])  12/03/2014 Mammogram   Right breast mass 1.9 cm it o'clock position 8 cm depth from the nipple   12/03/2014 Initial Diagnosis   Right breast biopsy 8:00: Invasive ductal carcinoma, grade 3, ER 0%, PR 0%, Ki-67 90%, HER-2 negative ratio 1.43   12/10/2014 Breast MRI   Right breast lower outer quadrant: 2.3 x 2.4 x 2.4 cm rim-enhancing mass abuts the pectoralis fascia but no enhancement of pectoralis muscle, second focus of artifact?'s second tissue marker clip, no lymph nodes   12/10/2014 Clinical Stage   Stage IIA: T2 N0   12/24/2014 - 04/29/2015 Neo-Adjuvant Chemotherapy   Dose dense Adriamycin and Cytoxan 4 followed by weekly Abraxane 12   05/02/2015 Breast MRI   complete radiologic response   06/16/2015 Surgery   Left Lumpectomy: Complete path Response, 0/2 LN   06/16/2015 Pathologic Stage   ypT0 ypN0   07/23/2015 - 09/05/2015 Radiation Therapy   Adjuvant RT: 50.4 Gy in 28 fractions and a boost of 10 Gy in 5 fractions to total dose of 60.4 Gy   10/24/2015 Survivorship   SCP mailed to patient in lieu of in person visit.   07/26/2016 - 07/27/2016 Radiation Therapy    SRS brain   07/27/2016 - 07/29/2016 Hospital Admission   Cerebellar mass: Right suboccipital craniotomy for tumor  resection with stereotactic navigation: Metastatic poorly differentiated adenocarcinoma with extensive necrosis positive for CK 7, MOC 31, CK 5/6; Neg for Er/PR, GATA-3, GCDFP CDX2, Napsin A and TTF-1   11/17/2016 PET scan   Subcutaneous nodules in the neck, upper back, left arm, abdomen and pelvis,. 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 in BARD 1, BRCA2 and NF1   12/31/2016 Miscellaneous   Everolimus 10 mg daily for cycle 1 if she cannot tolerate will decrease to 7.5 mg daily   05/24/2017 - 07/04/2017 Chemotherapy   Xeloda 2000 mg 2 weeks on 1 week off stopped due to progression of disease based on CT scans done 06/27/2017   07/25/2017 - 03/13/2018 Chemotherapy   Halaven days 1 and 8 every 3 weeks stopped for progression    03/20/2018 - 03/31/2018 Radiation Therapy   Radiation to lymph nodes   04/03/2018 -  Chemotherapy   Keytruda every 3 weeks      CHIEF COMPLIANT: Follow-up of metastatic breast cancer   INTERVAL HISTORY: Kaitlyn Keith is a 55 y.o. with above-mentioned history of metastatic breast cancer with brain metastasis. She presents to via [place] today for follow-up.   ALLERGIES:  has No Known Allergies.  MEDICATIONS:  Current Outpatient Medications  Medication  Sig Dispense Refill   alprazolam (XANAX) 2 MG tablet Take 1 tablet (2 mg total) by mouth at bedtime. 30 tablet 3   Carboxymeth-Cellulose-CitricAc (PLENITY) CAPS TAKE 3 CAPSULES BY MOUTH IN THE MORNING AND AT BEDTIME. (Patient not taking: Reported on 07/30/2020) 180 capsule 3   hydrOXYzine (ATARAX/VISTARIL) 25 MG tablet TAKE 1 TABLET (25 MG TOTAL) BY MOUTH AT BEDTIME AS NEEDED FOR ANXIETY.  90 tablet 2   levothyroxine (SYNTHROID) 112 MCG tablet TAKE 1 TABLET BY MOUTH EVERY DAY BEFORE BREAKFAST 90 tablet 0   lisinopril-hydrochlorothiazide (ZESTORETIC) 20-12.5 MG tablet TAKE 1 TABLET BY MOUTH TWICE A DAY 180 tablet 3   No current facility-administered medications for this visit.    PHYSICAL EXAMINATION: ECOG PERFORMANCE STATUS: 1 - Symptomatic but completely ambulatory     LABORATORY DATA:  I have reviewed the data as listed CMP Latest Ref Rng & Units 01/30/2021 07/07/2020 03/17/2020  Glucose 70 - 99 mg/dL 95 92 89  BUN 6 - 20 mg/dL _0 Creatinine 0.44 - 1.00 mg/dL 0.96 0.75 0.70  Sodium 135 - 145 mmol/L 142 141 140  Potassium 3.5 - 5.1 mmol/L 3.6 3.9 3.4(L)  Chloride 98 - 111 mmol/L 107 108 105  CO2 22 - 32 mmol/L _1 Calcium 8.9 - 10.3 mg/dL 9.3 8.7(L) 9.3  Total Protein 6.5 - 8.1 g/dL 7.2 6.9 7.4  Total Bilirubin 0.3 - 1.2 mg/dL 0.7 0.4 0.7  Alkaline Phos 38 - 126 U/L 89 93 103  AST 15 - 41 U/L _2 ALT 0 - 44 U/L _3 Lab Results  Component Value Date   WBC 4.3 01/30/2021   HGB 13.4 01/30/2021   HCT 39.6 01/30/2021   MCV 87.8 01/30/2021   PLT 187 01/30/2021   NEUTROABS 2.4 01/30/2021    ASSESSMENT & PLAN:  Breast cancer of lower-outer quadrant of right female breast ([place]) Right breast biopsy 12/03/2014 8:00: Invasive ductal carcinoma, grade 3, ER 0%, PR 0%, 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 Neoadj chemo 12/24/14- 04/29/15 AC x 4 foll by Abraxane X 12 Rt Lumpectomy: Path CR 0/2 LN Adj XRT 07/23/15- 09/08/15 PET/CT scan 11/17/2016:Subcutaneous nodules in the neck, upper back, left arm, abdomen and pelvis Patient progressed on Xeloda  January 2019-07/04/2017 stopped due to progression of disease Cerebellar mass diagnosed 07/12/2016: Resection followed by stereotactic radiation 07/29/2016 Lymph node from 03/02/2018  biopsy: PDL1+ Pembrolizumab completed 01/22/2020 ------------------------------------------------------------------------------------------------------------------------------------------------ Current treatment: Pembrolizumab completed end of October 2021. Pembrolizumab toxicities: Hypothyroidism: On Synthroid 115 mcg daily MRI brain 02/02/2020: Stable posttreatment changes. (Next one 08/02/20)    Chest wall symptoms: Likely musculoskeletal CT CAP 02/01/21: No evidence of mets. MRI Brain 02/01/2021: No new lesions   She now uses [employer] products.   Going to [place] in December 2022 (ceremony) Virtual visit  in January 2023.    No orders of the defined types were placed in this encounter.  The patient has a good understanding of the overall plan. she agrees with it. she will call with any problems that may develop before the next visit here.  Total time spent: 30 mins including face to face time and time spent for planning, charting and coordination of care  Rulon Eisenmenger, MD, MPH 02/02/2021  I, Thana Ates, am acting as scribe for Dr. Nicholas Keith.  I have reviewed the above documentation for accuracy and completeness, and I agree with the above.

## 2021-02-02 ENCOUNTER — Inpatient Hospital Stay: Payer: No Typology Code available for payment source

## 2021-02-02 ENCOUNTER — Inpatient Hospital Stay (HOSPITAL_BASED_OUTPATIENT_CLINIC_OR_DEPARTMENT_OTHER): Payer: No Typology Code available for payment source | Admitting: Hematology and Oncology

## 2021-02-02 DIAGNOSIS — C50511 Malignant neoplasm of lower-outer quadrant of right female breast: Secondary | ICD-10-CM

## 2021-02-02 DIAGNOSIS — Z171 Estrogen receptor negative status [ER-]: Secondary | ICD-10-CM | POA: Diagnosis not present

## 2021-02-02 DIAGNOSIS — E039 Hypothyroidism, unspecified: Secondary | ICD-10-CM | POA: Diagnosis not present

## 2021-02-02 NOTE — Assessment & Plan Note (Signed)
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 Neoadj chemo 12/24/14- 04/29/15 AC x 4 foll by Abraxane X 12 Rt Lumpectomy: Path CR 0/2 LN Adj XRT 07/23/15- 09/08/15 PET/CT scan 11/17/2016:Subcutaneous nodules in the neck, upper back, left arm, abdomen and pelvis Patient progressed on Xeloda January 2019-07/04/2017 stopped due to progression of disease Cerebellar mass diagnosed 07/12/2016: Resection followed by stereotactic radiation 07/29/2016 Lymph node from 03/02/2018 biopsy: PDL1+ Pembrolizumab completed 01/22/2020 ------------------------------------------------------------------------------------------------------------------------------------------------ Current treatment:Pembrolizumab completed end of October 2021. Pembrolizumab toxicities: Hypothyroidism: On Synthroid 115mcg daily MRI brain 02/02/2020: Stable posttreatment changes.(Next one 08/02/20)  Chest wall symptoms: Likely musculoskeletal CT CAP2/27/2022: No evidence of mets.  She now uses [employer] products.    CT CAP and brain MRI scheduled for September. Going to [place] in December 2022 (ceremony) Virtual visit  in January 2023.  

## 2021-02-03 ENCOUNTER — Encounter: Payer: Self-pay | Admitting: Urology

## 2021-02-03 NOTE — Progress Notes (Signed)
Patient states doing well. No symptoms to report at this time.  Meaningful use complete and patient notified of 8:30am-02/04/21 telephone appointment and understands.

## 2021-02-04 ENCOUNTER — Other Ambulatory Visit: Payer: Self-pay | Admitting: Radiation Therapy

## 2021-02-04 ENCOUNTER — Ambulatory Visit
Admission: RE | Admit: 2021-02-04 | Discharge: 2021-02-04 | Disposition: A | Discharge status: Home / Self Care | Payer: No Typology Code available for payment source | Source: Ambulatory Visit | Attending: Urology | Admitting: Urology

## 2021-02-04 DIAGNOSIS — C7931 Secondary malignant neoplasm of brain: Secondary | ICD-10-CM

## 2021-02-04 NOTE — Progress Notes (Signed)
Radiation Oncology         [phone]) [phone] ________________________________  Name: Kaitlyn Keith MRN: [id number]  Date: 02/04/2021  DOB: Aug 27, 1965  Follow-Up Visit Note  CC: Patient, [name]  [name], MD  Diagnosis:    55 y.o. 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      ICD-10-CM   1. 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 Pelvis / 30 Gy in 10 fractions (right inguinal nodes) 2. Left Supraclavicular / 30 Gy in 10 fractions 3. Abdomen / 20 Gy in 8 fractions (subcutaneous Met)  07/11/2017 - 07/22/2017 palliative XRT for painful subcutaneous nodules ([place]) 1. Left Flank / 30 Gy in 10 fractions 2. Left Groin / 30 Gy in 10 fractions  07/26/16 Preop SRS Treatment Tammi Klippel): PTV1 Right Cerebellum was treated to 18 Gy in 1 fraction  07/23/15-09/05/15 ([place]): 50.4 Gy to the right breast + 10 Gy boost  Narrative:  I spoke with the patient to conduct her routine scheduled 6 month follow up visit to review recent MRI brain via telephone to spare the patient unnecessary potential exposure in the healthcare setting during the current COVID-19 pandemic.  The patient was notified in advance and gave permission to proceed with this visit format.  [name] is a pleasant 55 y.o. 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 She was diagnosed with her cancer in July 2016, and underwent neoadjuvant chemotherapy followed by right lumpectomy and adjuvant radiation. She was found to have recurrent disease in February 2018 with a solitary right cerebellar mass, and underwent preoperative SRS treatment  followed by surgical resection on 07/29/2016.   She developed multiple subcutaneous nodules in the neck, upper back, left arm, abdomen and pelvis, noted on PET scan from 11/17/16. She was started on Everolimus on 12/31/16 but was discontinued 04/11/17 due to disease progression with interval development of subcutaneous nodules in the ventral lower left pelvic wall and left flank and interval growth of 3 scattered peritoneal metastases. She started Xeloda in 05/2017 but discontinued due to disease progression on re-staging scans from 06/27/17 which showed a mixed response to therapy with some regression of the previously noted intraperitoneal implants but other lesions with significant interval growth including subcutaneous nodules in the left flank, left vulvar region, right inguinal LAN and anterior mediastinal LAN. She elected to move forward with palliative XRT to 2 painful subcutaneous lesions in the left flank and left pubic/vulvar region which was completed in March 2019.  She tolerated radiotherapy very well and had an excellent response with decreased size of both treated lesions.  Her systemic chemotherapy was switched to [facility] but this was discontinued in October 2019 due to evidence of disease progression on follow-up CT C/A/P from 02/02/2018 indicating continued progression of right inguinal lymph node and right inguinal lymphadenopathy.  She underwent a CT-guided biopsy of the right inguinal lymph node on 03/02/2018 with final pathology confirming metastatic, poorly differentiated carcinoma, ER/PR negative and HER-2 negative.  Her systemic therapy was changed to pembrolizumab immunotherapy at that time and she was also referred back to radiation oncology for consideration of palliative radiotherapy to the painful,  progressive right inguinal lymphadenopathy.  At the time of her consult on 03/15/2018, she also mentioned that she had recently developed pain in the left supraclavicular region as well as in  the left flank area.  She elected to proceed with palliative radiotherapy to the 3 sites of painful metastatic disease in the right inguinal, left supraclavicular and left abdomen/flank and this was completed on 03/31/2018.  She tolerated the radiation well and did have significant improvement in her pain.  She has now completed 28 cycles of Keytruda (pembrolizumab) and her follow up CT C/A/P from 12/14/19 showed complete resolution of metastatic focus in the right inguinal/groin region as well as complete resolution of the left supraclavicular lymphadenopathy.  Stable 6 mm hypodense lesion in the liver.  No current evidence of active malignancy in the chest, abdomen or pelvis. She has continued in routine follow up with Dr. Lindi Adie, in observation, as her maintenance immunotherapy with [facility] every 3 weeks was completed 03/17/2020 after the 30th cycle.  Her most recent restaging CT C/A/P from 01/30/21 shows no evidence of recurrent or metastatic disease in the chest, abdomen or pelvis.  She has also continued being followed in our multidisciplinary brain tumor conference, and her last MRI scan on 01/31/21 was recently reviewed in brain tumor board on Monday 02/02/21 and revealed a continued stable appearance of the 3 mm enhancing nodule in the right lateral cerebellum at the site of previous tumor treatment, felt to be consistent with treatment related changes especially in light of its stable appearance over multiple scans since late 2020. There were no other enhancing lesions. Today's visit is to review these findings.  On review of systems, the patient reports that she is doing well overall. She denies any chest pain, shortness of breath, cough, fevers, chills, night sweats, or unintended weight changes. She denies any bowel or bladder disturbances, and denies abdominal pain, nausea or vomiting. She denies new visual or auditory changes, dizziness, imbalance, tremors or seizure activity. She has continued having  occasional headaches which respond to tylenol or advil if needed.  The headaches became severe back in 10/2018 and she ended up having a wisdom tooth extraction with significant improvement of her HAs. She has some chronic pain in the left groin/hip joint region that radiates down her left leg anteriorly to her foot.  This pain is exacerbated with activities and improves with rest and does not wake her from sleep at night. An MRI of the hip in October 2020 was without evidence of bony metastatic disease or acute abnormalities.  She reports mild improvement with taking muscle relaxants prn. She denies any new skin lesions or concerns. A complete review of systems is obtained and is otherwise negative.  Past Medical History:  Past Medical History:  Diagnosis Date   Anxiety    Arthritis    back   Back pain    Brain cancer ([place])    brian met from triple negative breast ca   Breast cancer [facility])    Breast cancer of lower-outer quadrant of right female breast ([place]) 12/05/2014   Cancer of right breast metastatic to brain [facility])    Depression    FH: chemotherapy 12/2014-04/2015   Genetic testing 12/10/2016   [name] underwent genetic counseling and testing for hereditary cancer syndromes on 11/18/2016. 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 Mutations in MSH6 are associated with a hereditary cancer syndrome called Lynch syndrome. For more detailed discussion, please see genetic counseling  documentation from 12/10/2016.  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 underwent genetic counseling and testing for hereditary cancer syndromes on 11/18/2016. 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 Mutations in MSH6 are associated with a hereditary  cancer syndrome called Lynch syndrome. For more detailed discussion, please see genetic counseling documentation from 12/10/2016.  Testing was perfo   Radiation 07/23/15-09/05/15   right breast 50.4 Gy, boost of 10 Gy    Past Surgical History: Past Surgical History:  Procedure Laterality Date   APPLICATION OF CRANIAL NAVIGATION Right 07/27/2016   Procedure: APPLICATION OF CRANIAL NAVIGATION;  Surgeon: Kevan Ny Ditty, MD;  Location: [place];  Service: Neurosurgery;  Laterality: Right;   BIOPSY OF SKIN SUBCUTANEOUS TISSUE AND/OR MUCOUS MEMBRANE Left 12/24/2016   Procedure: OPEN BIOPSY LESIONS ON LEFT LOWER BACK AND SHOULDER BLADE;  Surgeon: [name], MD;  Location: [place];  Service: General;  Laterality: Left;   BREAST LUMPECTOMY WITH NEEDLE LOCALIZATION AND AXILLARY SENTINEL LYMPH NODE BX Right 06/16/2015   Procedure: BREAST LUMPECTOMY WITH NEEDLE LOCALIZATION AND AXILLARY SENTINEL LYMPH NODE BX;  Surgeon: [name], MD;  Location: [place];  Service: General;  Laterality: Right;   BREAST REDUCTION SURGERY     CRANIOTOMY Right 07/27/2016   Procedure: Right Suboccipital craniotomy for tumor resection with stereotactic navigation;  Surgeon: Kevan Ny Ditty, MD;  Location: [place];  Service: Neurosurgery;  Laterality: Right;   PORT-A-CATH REMOVAL N/A 06/16/2015   Procedure: REMOVAL PORT-A-CATH;  Surgeon: [name], MD;  Location: [place];  Service: General;  Laterality: N/A;   PORT-A-CATH REMOVAL Left 04/21/2020   Procedure: REMOVAL PORT-A-CATH;  Surgeon: [name], MD;  Location: [place];  Service: General;  Laterality: Left;   PORTACATH PLACEMENT N/A 12/23/2014   Procedure: INSERTION PORT-A-CATH;  Surgeon: [name], MD;  Location: [place];  Service: General;  Laterality: N/A;   PORTACATH PLACEMENT Left 07/08/2017   Procedure: INSERTION PORT-A-CATH;  Surgeon: [name], MD;  Location: [place];  Service: General;  Laterality: Left;    Social History:  Social History   Socioeconomic History   Marital status: Married    Spouse name: Not on file   Number of children: 1   Years of education: Not on file   Highest education level: Not on file  Occupational History   Not on file  Tobacco Use   Smoking status: Never   Smokeless tobacco: Never  Vaping Use   Vaping Use: Never used  Substance and Sexual Activity   Alcohol use: Yes    Comment: social   Drug use: No   Sexual activity: Yes    Birth control/protection: None  Other Topics Concern   Not on file  Social History Narrative   Not on file   Social Determinants of Health   Financial Resource Strain: Not on file  Food Insecurity: Not on file  Transportation Needs: Not on file  Physical Activity: Not on file  Stress: Not on file  Social Connections: Not on file  Intimate Partner Violence: Not on file  The patient is divorced. She has worked for [employer] firm, and continues working from home full time.  Family History: Family  History  Problem Relation Age of Onset   Aneurysm Mother 45       d.55   Heart attack Father 58       d.62   Endometrial cancer Sister 5   Lung cancer Maternal Uncle        d.68s   Cancer Maternal Grandmother        unspecified type-possibly stomach d.88s                          ALLERGIES:  has No Known Allergies.  Meds: Current Outpatient Medications  Medication Sig Dispense Refill   alprazolam (XANAX) 2 MG tablet Take 1 tablet (2 mg total) by mouth at bedtime. 30 tablet 3   hydrOXYzine (ATARAX/VISTARIL) 25 MG tablet TAKE 1 TABLET (25 MG TOTAL) BY MOUTH AT BEDTIME AS NEEDED FOR ANXIETY. 90 tablet 2   levothyroxine (SYNTHROID) 112 MCG tablet TAKE 1 TABLET BY MOUTH EVERY DAY BEFORE BREAKFAST 90 tablet 0   lisinopril-hydrochlorothiazide (ZESTORETIC) 20-12.5 MG tablet TAKE 1 TABLET BY MOUTH TWICE A DAY 180 tablet 3   Carboxymeth-Cellulose-CitricAc (PLENITY) CAPS  TAKE 3 CAPSULES BY MOUTH IN THE MORNING AND AT BEDTIME. (Patient not taking: No sig reported) 180 capsule 3   No current facility-administered medications for this encounter.    Physical Findings:   vitals were not taken for this visit.   Pain Assessment Pain Score: 0-No painUnable to assess due to telephone visit format.   Lab Findings: Lab Results  Component Value Date   WBC 4.3 01/30/2021   WBC 3.0 (L) 03/02/2018   HGB 13.4 01/30/2021   HGB 11.7 04/04/2017   HCT 39.6 01/30/2021   HCT 35.9 04/04/2017   PLT 187 01/30/2021   PLT 207 04/04/2017    Lab Results  Component Value Date   NA 142 01/30/2021   NA 139 04/04/2017   K 3.6 01/30/2021   K 3.7 04/04/2017   CHLORIDE 108 04/04/2017   CO2 25 01/30/2021   CO2 24 04/04/2017   GLUCOSE 95 01/30/2021   GLUCOSE 76 04/04/2017   BUN 12 01/30/2021   BUN 5.8 (L) 04/04/2017   CREATININE 0.96 01/30/2021   CREATININE 0.8 04/04/2017   BILITOT 0.7 01/30/2021   BILITOT 0.43 04/04/2017   ALKPHOS 89 01/30/2021   ALKPHOS 131 04/04/2017   AST 15 01/30/2021   AST 18 04/04/2017   ALT 8 01/30/2021   ALT 12 04/04/2017   PROT 7.2 01/30/2021   PROT 7.0 04/04/2017   ALBUMIN 3.8 01/30/2021   ALBUMIN 3.0 (L) 04/04/2017   CALCIUM 9.3 01/30/2021   CALCIUM 8.6 04/04/2017   ANIONGAP 10 01/30/2021    Radiographic Findings: MR Brain W Wo Contrast  Result Date: 02/01/2021 CLINICAL DATA:  Brain/CNS neoplasm, surveillance three T SRS protocol. Additional history obtained from prior radiology records: Patient with history of metastatic breast cancer status post preoperative SRS to the right cerebellum followed by resection. EXAM: MRI HEAD WITHOUT AND WITH CONTRAST TECHNIQUE: Multiplanar, multiecho pulse sequences of the brain and surrounding structures were obtained without and with intravenous contrast. CONTRAST:  80m MULTIHANCE GADOBENATE DIMEGLUMINE 529 MG/ML IV SOLN COMPARISON:  Prior brain MRI examinations 08/02/2020 and earlier. FINDINGS:  Brain: Mild generalized cerebral and cerebellar atrophy. Redemonstrated postoperative encephalomalacia/gliosis within the lateral right cerebellar hemisphere. A small crescentic focus of enhancement within the lateral right cerebellar hemisphere is unchanged from the brain MRI of 08/02/2020. Regional T2/FLAIR hyperintense signal abnormality has also not appreciably changed. No developing mass  effect. As before, there is minimal regional chronic hemosiderin deposition. No new intracranial metastasis is identified. Minimal chronic small-vessel ischemic changes within the cerebral white matter, stable. There is no acute infarct. No extra-axial fluid collection. No midline shift. Vascular: Maintained flow voids within the proximal large arterial vessels. Skull and upper cervical spine: No focal suspicious marrow lesion. Incompletely assessed cervical spondylosis. Sinuses/Orbits: Visualized orbits show no acute finding. No significant paranasal sinus disease. IMPRESSION: Continued stable post-treatment appearance of the right cerebellar hemisphere as compared to the most recent prior examination of 08/02/2020. No new intracranial metastasis is identified. Electronically Signed   By: Kellie Simmering D.O.   On: 02/01/2021 17:38   CT CHEST ABDOMEN PELVIS W CONTRAST  Result Date: 02/01/2021 CLINICAL DATA:  Metastatic breast cancer restaging EXAM: CT CHEST, ABDOMEN, AND PELVIS WITH CONTRAST TECHNIQUE: Multidetector CT imaging of the chest, abdomen and pelvis was performed following the standard protocol during bolus administration of intravenous contrast. CONTRAST:  78m OMNIPAQUE IOHEXOL 350 MG/ML SOLN, additional oral enteric contrast COMPARISON:  07/20/2020 FINDINGS: CT CHEST FINDINGS Cardiovascular: No significant vascular findings. Normal heart size. No pericardial effusion. Mediastinum/Nodes: No enlarged mediastinal, hilar, or axillary lymph nodes. Surgical clips in the right axilla. Thymic remnant in the anterior  mediastinum. Thyroid gland, trachea, and esophagus demonstrate no significant findings. Lungs/Pleura: Lungs are clear. No pleural effusion or pneumothorax. Musculoskeletal: No chest wall mass or suspicious bone lesions identified. CT ABDOMEN PELVIS FINDINGS Hepatobiliary: No solid liver abnormality is seen. No gallstones, gallbladder wall thickening, or biliary dilatation. Pancreas: Unremarkable. No pancreatic ductal dilatation or surrounding inflammatory changes. Spleen: Normal in size without significant abnormality. Adrenals/Urinary Tract: Adrenal glands are unremarkable. Kidneys are normal, without renal calculi, solid lesion, or hydronephrosis. Bladder is unremarkable. Stomach/Bowel: Stomach is within normal limits. Appendix appears normal. No evidence of bowel wall thickening, distention, or inflammatory changes. Occasional descending and sigmoid diverticula. Vascular/Lymphatic: Scattered aortic atherosclerosis. No enlarged abdominal or pelvic lymph nodes. Reproductive: No mass or other abnormality. Other: No abdominal wall hernia or abnormality. No abdominopelvic ascites. Musculoskeletal: No acute or significant osseous findings. IMPRESSION: 1. No evidence of recurrent or metastatic disease in the chest, abdomen, or pelvis. 2.  Surgical clips in the right axilla. Aortic Atherosclerosis (ICD10-I70.0). Electronically Signed   By: AEddie CandleM.D.   On: 02/01/2021 08:43     Impression/Plan: 1. 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  Her most recent MRI brain from 01/31/21 was recently reviewed with the multidisciplinary tumor board on Monday 02/02/21 and revealed a continued stable appearance of the 3 mm enhancing nodule in the right lateral cerebellum at the site of previous tumor treatment, felt to be consistent with treatment related changes, especially given the stable appearance over multiple scans  since late 2020. There were no other enhancing lesions. The recommendation is to continue with surveillance MRI every 6 months and a follow up visit thereafter to review results and recommendations from tumor board. Her most recent systemic imaging from 01/30/21 continues to show no evidence of recurrent or metastatic disease in the chest, abdomen or pelvis.  She met with her medical oncologist, Dr. GLindi Adiein follow-up on 02/02/21 and the recommendation is to continue in observation since she has completed her maintenance immunotherapy with Keytruda q 3 weeks to complete a total of 30 cycles, completed in October 2021. She will continue with repeat CT scans for systemic disease restaging every 6 months under  the care and direction of Dr. Lindi Adie. She knows to call us with any questions or concerns in the interim and is comfortable and in agreement with this plan.   I personally spent 25 minutes in this encounter including chart review, reviewing radiological studies, meeting face-to-face with the patient, entering orders and completing documentation.     Nicholos Johns, PA-C

## 2021-03-27 DIAGNOSIS — M19042 Primary osteoarthritis, left hand: Secondary | ICD-10-CM | POA: Insufficient documentation

## 2021-03-27 DIAGNOSIS — M79642 Pain in left hand: Secondary | ICD-10-CM | POA: Insufficient documentation

## 2021-04-20 ENCOUNTER — Other Ambulatory Visit: Payer: Self-pay | Admitting: Hematology and Oncology

## 2021-07-31 ENCOUNTER — Other Ambulatory Visit: Payer: Self-pay

## 2021-07-31 ENCOUNTER — Inpatient Hospital Stay: Payer: No Typology Code available for payment source | Attending: Hematology and Oncology

## 2021-07-31 ENCOUNTER — Ambulatory Visit (HOSPITAL_COMMUNITY)
Admission: RE | Admit: 2021-07-31 | Discharge: 2021-07-31 | Disposition: A | Discharge status: Home / Self Care | Payer: No Typology Code available for payment source | Source: Ambulatory Visit | Attending: Hematology and Oncology | Admitting: Hematology and Oncology

## 2021-07-31 DIAGNOSIS — C50511 Malignant neoplasm of lower-outer quadrant of right female breast: Secondary | ICD-10-CM

## 2021-07-31 DIAGNOSIS — Z171 Estrogen receptor negative status [ER-]: Secondary | ICD-10-CM

## 2021-07-31 DIAGNOSIS — E039 Hypothyroidism, unspecified: Secondary | ICD-10-CM

## 2021-07-31 LAB — CBC WITH DIFFERENTIAL (CANCER CENTER ONLY)
Abs Immature Granulocytes: 0 10*3/uL (ref 0.00–0.07)
Basophils Absolute: 0 10*3/uL (ref 0.0–0.1)
Basophils Relative: 1 %
Eosinophils Absolute: 0 10*3/uL (ref 0.0–0.5)
Eosinophils Relative: 1 %
HCT: 44 % (ref 36.0–46.0)
Hemoglobin: 14.6 g/dL (ref 12.0–15.0)
Immature Granulocytes: 0 %
Lymphocytes Relative: 36 %
Lymphs Abs: 1.2 10*3/uL (ref 0.7–4.0)
MCH: 29.4 pg (ref 26.0–34.0)
MCHC: 33.2 g/dL (ref 30.0–36.0)
MCV: 88.7 fL (ref 80.0–100.0)
Monocytes Absolute: 0.3 10*3/uL (ref 0.1–1.0)
Monocytes Relative: 10 %
Neutro Abs: 1.8 10*3/uL (ref 1.7–7.7)
Neutrophils Relative %: 52 %
Platelet Count: 192 10*3/uL (ref 150–400)
RBC: 4.96 MIL/uL (ref 3.87–5.11)
RDW: 12 % (ref 11.5–15.5)
WBC Count: 3.5 10*3/uL — ABNORMAL LOW (ref 4.0–10.5)
nRBC: 0 % (ref 0.0–0.2)

## 2021-07-31 LAB — CMP (CANCER CENTER ONLY)
ALT: 12 U/L (ref 0–44)
AST: 18 U/L (ref 15–41)
Albumin: 4.4 g/dL (ref 3.5–5.0)
Alkaline Phosphatase: 85 U/L (ref 38–126)
Anion gap: 7 (ref 5–15)
BUN: 10 mg/dL (ref 6–20)
CO2: 31 mmol/L (ref 22–32)
Calcium: 9.9 mg/dL (ref 8.9–10.3)
Chloride: 101 mmol/L (ref 98–111)
Creatinine: 0.73 mg/dL (ref 0.44–1.00)
GFR, Estimated: >60 mL/min (ref >60)
Glucose, Bld: 91 mg/dL (ref 70–99)
Potassium: 3.5 mmol/L (ref 3.5–5.1)
Sodium: 139 mmol/L (ref 135–145)
Total Bilirubin: 0.8 mg/dL (ref 0.3–1.2)
Total Protein: 7.7 g/dL (ref 6.5–8.1)

## 2021-07-31 MED ORDER — IOHEXOL 300 MG/ML  SOLN
100.0000 mL | Freq: Once | INTRAMUSCULAR | Status: AC | PRN
  Administered 2021-07-31: 100 mL via INTRAVENOUS

## 2021-07-31 MED ORDER — SODIUM CHLORIDE (PF) 0.9 % IJ SOLN
INTRAMUSCULAR | Status: AC
  Filled 2021-07-31: qty 50

## 2021-07-31 NOTE — Progress Notes (Incomplete)
HEMATOLOGY-ONCOLOGY TELEPHONE VISIT PROGRESS NOTE  I connected with _0 @ on 07/31/21 at  9:30 AM EDT by telephone and verified that I am speaking with the correct person using two identifiers.  I discussed the limitations, risks, security and privacy concerns of performing an evaluation and management service by telephone and the availability of in person appointments.  I also discussed with the patient that there may be a patient responsible charge related to this service. The patient expressed understanding and agreed to proceed.  CHIEF COMPLIANT: Follow-up of metastatic breast cancer   History of Present Illness:  Kaitlyn Keith is a 56 y.o. with above-mentioned history of metastatic breast cancer with brain metastasis. She presents to via telephone for follow-up. Oncology History  Breast cancer of lower-outer quadrant of right female breast ([place])  12/03/2014 Mammogram   Right breast mass 1.9 cm it o'clock position 8 cm depth from the nipple   12/03/2014 Initial Diagnosis   Right breast biopsy 8:00: Invasive ductal carcinoma, grade 3, ER 0%, PR 0%, Ki-67 90%, HER-2 negative ratio 1.43   12/10/2014 Breast MRI   Right breast lower outer quadrant: 2.3 x 2.4 x 2.4 cm rim-enhancing mass abuts the pectoralis fascia but no enhancement of pectoralis muscle, second focus of artifact?'s second tissue marker clip, no lymph nodes   12/10/2014 Clinical Stage   Stage IIA: T2 N0   12/24/2014 - 04/29/2015 Neo-Adjuvant Chemotherapy   Dose dense Adriamycin and Cytoxan 4 followed by weekly Abraxane 12   05/02/2015 Breast MRI   complete radiologic response   06/16/2015 Surgery   Left Lumpectomy: Complete path Response, 0/2 LN   06/16/2015 Pathologic Stage   ypT0 ypN0   07/23/2015 - 09/05/2015 Radiation Therapy   Adjuvant RT: 50.4 Gy in 28 fractions and a boost of 10 Gy in 5 fractions to total dose of 60.4 Gy   10/24/2015 Survivorship   SCP mailed to patient in lieu of in person visit.    07/26/2016 - 07/27/2016 Radiation Therapy    SRS brain   07/27/2016 - 07/29/2016 Hospital Admission   Cerebellar mass: Right suboccipital craniotomy for tumor resection with stereotactic navigation: Metastatic poorly differentiated adenocarcinoma with extensive necrosis positive for CK 7, MOC 31, CK 5/6; Neg for Er/PR, GATA-3, GCDFP CDX2, Napsin A and TTF-1   11/17/2016 PET scan   Subcutaneous nodules in the neck, upper back, left arm, abdomen and pelvis,. 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 in BARD 1, BRCA2 and NF1   12/31/2016 Miscellaneous   Everolimus 10 mg daily for cycle 1 if she cannot tolerate will decrease to 7.5 mg daily   05/24/2017 - 07/04/2017 Chemotherapy   Xeloda 2000 mg 2 weeks on 1 week off stopped due to progression of disease based on CT scans done 06/27/2017   07/25/2017 - 03/13/2018 Chemotherapy   [place] days 1 and 8 every 3 weeks stopped for progression    03/20/2018 - 03/31/2018 Radiation Therapy   Radiation to lymph nodes   04/03/2018 -  Chemotherapy   Keytruda every 3 weeks      REVIEW OF SYSTEMS:   Constitutional: Denies fevers, chills or abnormal weight loss Eyes: Denies blurriness of vision Ears, nose, mouth, throat, and face: Denies mucositis or sore throat Respiratory: Denies cough,  dyspnea or wheezes Cardiovascular: Denies palpitation, chest discomfort Gastrointestinal:  Denies nausea, heartburn or change in bowel habits Skin: Denies abnormal skin rashes Lymphatics: Denies new lymphadenopathy or easy bruising Neurological:Denies numbness, tingling or new weaknesses Behavioral/Psych: Mood is stable, no new changes  Extremities: No  lower extremity edema Breast: *** denies any pain or lumps or nodules in either breasts All other systems were reviewed with the patient and are negative. Observations/Objective:     Assessment Plan:  No problem-specific Assessment & Plan notes found for this encounter.    I discussed the assessment and treatment plan with the patient. The patient was provided an opportunity to ask questions and all were answered. The patient agreed with the plan and demonstrated an understanding of the instructions. The patient was advised to call back or seek an in-person evaluation if the symptoms worsen or if the condition fails to improve as anticipated.   I provided *** minutes of non-face-to-face time during this encounter. [name], CMA  I, [employer], am acting as a Education administrator for Dr. Lindi Adie

## 2021-08-01 ENCOUNTER — Other Ambulatory Visit: Payer: No Typology Code available for payment source

## 2021-08-01 ENCOUNTER — Ambulatory Visit
Admission: RE | Admit: 2021-08-01 | Discharge: 2021-08-01 | Disposition: A | Discharge status: Home / Self Care | Payer: No Typology Code available for payment source | Source: Ambulatory Visit | Attending: Radiation Oncology | Admitting: Radiation Oncology

## 2021-08-01 DIAGNOSIS — C7931 Secondary malignant neoplasm of brain: Secondary | ICD-10-CM

## 2021-08-01 DIAGNOSIS — C7949 Secondary malignant neoplasm of other parts of nervous system: Secondary | ICD-10-CM

## 2021-08-01 LAB — THYROID PANEL WITH TSH
Free Thyroxine Index: 3.5 (ref 1.2–4.9)
T3 Uptake Ratio: 33 % (ref 24–39)
T4, Total: 10.5 ug/dL (ref 4.5–12.0)
TSH: 1.75 u[IU]/mL (ref 0.450–4.500)

## 2021-08-01 MED ORDER — GADOBENATE DIMEGLUMINE 529 MG/ML IV SOLN
15.0000 mL | Freq: Once | INTRAVENOUS | Status: AC | PRN
  Administered 2021-08-01: 15 mL via INTRAVENOUS

## 2021-08-03 ENCOUNTER — Inpatient Hospital Stay: Payer: No Typology Code available for payment source

## 2021-08-04 ENCOUNTER — Encounter: Payer: Self-pay | Admitting: Urology

## 2021-08-04 ENCOUNTER — Inpatient Hospital Stay (HOSPITAL_BASED_OUTPATIENT_CLINIC_OR_DEPARTMENT_OTHER): Payer: No Typology Code available for payment source | Admitting: Hematology and Oncology

## 2021-08-04 DIAGNOSIS — C50511 Malignant neoplasm of lower-outer quadrant of right female breast: Secondary | ICD-10-CM | POA: Diagnosis not present

## 2021-08-04 DIAGNOSIS — Z171 Estrogen receptor negative status [ER-]: Secondary | ICD-10-CM | POA: Diagnosis not present

## 2021-08-04 NOTE — Assessment & Plan Note (Signed)
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 ?Neoadj chemo 12/24/14- 04/29/15 AC x 4 foll by Abraxane X 12 ?Rt Lumpectomy: Path CR 0/2 LN ?Adj XRT 07/23/15- 09/08/15 ?PET/CT scan 11/17/2016:Subcutaneous nodules in the neck, upper back, left arm, abdomen and pelvis ?Patient progressed on Xeloda ?January 2019-07/04/2017 stopped due to progression of disease ?Cerebellar mass diagnosed 07/12/2016: Resection followed by stereotactic radiation 07/29/2016 ?Lymph node from 03/02/2018 biopsy: PDL1+ ?Pembrolizumab completed 01/22/2020 ?------------------------------------------------------------------------------------------------------------------------------------------------ ?Current treatment:?Pembrolizumab completed end of October 2021. ?Pembrolizumab toxicities: Hypothyroidism: On Synthroid 115?mcg daily ?MRI brain 08/02/2021: Stable posttreatment changes. ?CT CAP 08/01/2021: Stable ??? ?Chest wall symptoms: Likely musculoskeletal ?? ?She now uses [employer].? ?? ?Going to [place] in December 2022 ([place]) ? ?Return to clinic in 1 year for follow-up ?

## 2021-08-04 NOTE — Progress Notes (Signed)
Spoke w/ patient, verified identity, and began nursing interview by phone. Patient is doing well and reports no issues at this time. ? ?Meaningful use complete. ? ?Reminded patient of her 8:30am-08/05/21 telephone appointment w/ [name] Bruning PA-C. I left my extension [phone] in case patient needs anything. Patient verbalized understanding of information. ? ?Patient contact [phone] ?

## 2021-08-05 ENCOUNTER — Ambulatory Visit
Admission: RE | Admit: 2021-08-05 | Discharge: 2021-08-05 | Disposition: A | Discharge status: Home / Self Care | Payer: No Typology Code available for payment source | Source: Ambulatory Visit | Attending: Urology | Admitting: Urology

## 2021-08-05 ENCOUNTER — Telehealth: Payer: Self-pay | Admitting: Hematology and Oncology

## 2021-08-05 DIAGNOSIS — C7931 Secondary malignant neoplasm of brain: Secondary | ICD-10-CM

## 2021-08-05 NOTE — Progress Notes (Signed)
?Radiation Oncology         [phone] ?________________________________ ? ?Name: Kaitlyn Keith MRN: [id number]  ?Date: 08/05/2021  DOB: Oct 04, 1965 ? ?Follow-Up Visit Note ? ?CC: Patient, [name]  [name], MD ? ?Diagnosis:    56 y.o. 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  ? ? ?  ICD-10-CM   ?1. 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  ? ? ?Interval Since Last Radiation: 3 years s/p palliative XRT to nodes and subcutaneous lesions; 4.5 years s/p pre-op SRS to solitary right cerebellar brain met ? ?03/20/18 - 03/31/18: ([facility]) ?1. Pelvis / 30 Gy in 10 fractions (right inguinal nodes) ?2. Left Supraclavicular / 30 Gy in 10 fractions ?3. Abdomen / 20 Gy in 8 fractions (subcutaneous Met) ? ?07/11/2017 - 07/22/2017 palliative XRT for painful subcutaneous nodules [facility]) ?1. Left Flank / 30 Gy in 10 fractions ?2. Left Groin / 30 Gy in 10 fractions ? ?07/26/16 Preop SRS Treatment [facility]): ?PTV1 Right Cerebellum was treated to 18 Gy in 1 fraction ? ?07/23/15-09/05/15 ([facility]): ?50.4 Gy to the right breast + 10 Gy boost ? ?Narrative:  I spoke with the patient to conduct her routine scheduled 6 month follow up visit to review recent MRI brain via telephone to spare the patient unnecessary potential exposure in the healthcare setting during the current COVID-19 pandemic.  The patient was notified in advance and gave permission to proceed with this visit format. ? ?[name] is a pleasant 56 y.o. 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 She was diagnosed with her cancer in July 2016, and underwent neoadjuvant chemotherapy followed by right lumpectomy and adjuvant radiation. She was found to have recurrent disease in February 2018 with a solitary right cerebellar mass, and underwent preoperative SRS treatment  followed by surgical resection on 07/29/2016.  ? ?She developed multiple subcutaneous nodules in the neck, upper back, left arm, abdomen and pelvis, noted on PET scan from 11/17/16. She was started on Everolimus on 12/31/16 but was discontinued 04/11/17 due to disease progression with interval development of subcutaneous nodules in the ventral lower left pelvic wall and left flank and interval growth of 3 scattered peritoneal metastases. She started Xeloda in 05/2017 but discontinued due to disease progression on re-staging scans from 06/27/17 which showed a mixed response to therapy with some regression of the previously noted intraperitoneal implants but other lesions with significant interval growth including subcutaneous nodules in the left flank, left vulvar region, right inguinal LAN and anterior mediastinal LAN. She elected to move forward with palliative XRT to 2 painful subcutaneous lesions in the left flank and left pubic/vulvar region which was completed in March 2019.  She tolerated radiotherapy very well and had an excellent response with decreased size of both treated lesions.  Her systemic chemotherapy was switched to [facility] but this was discontinued in October 2019 due to evidence of disease progression on follow-up CT C/A/P from 02/02/2018 indicating continued progression of right inguinal lymph node and right inguinal lymphadenopathy.  She underwent a CT-guided biopsy of the right inguinal lymph node on 03/02/2018 with final pathology confirming metastatic, poorly differentiated carcinoma, ER/PR negative and HER-2 negative.  Her systemic therapy was changed to pembrolizumab immunotherapy at that time and she was also referred back to radiation oncology for consideration of palliative radiotherapy to the painful,  progressive right inguinal lymphadenopathy.  At the time of her consult on 03/15/2018, she also mentioned that she had recently developed pain in the left supraclavicular region as well as in  the left flank area.  She elected to proceed with palliative radiotherapy to the 3 sites of painful metastatic disease in the right inguinal, left supraclavicular and left abdomen/flank and this was completed on 03/31/2018.  She tolerated the radiation well and did have significant improvement in her pain.  She completed 28 cycles of Keytruda (pembrolizumab) and her follow up CT C/A/P from 12/14/19 showed complete resolution of metastatic focus in the right inguinal/groin region as well as complete resolution of the left supraclavicular lymphadenopathy and a stable 6 mm hypodense lesion in the liver.  No current evidence of active malignancy in the chest, abdomen or pelvis. She completed maintenance immunotherapy with Keytruda on 03/17/2020 after the 30th cycle ad restaging CT scans have continued to show no evidence of recurrent or metastatic disease in the chest, abdomen or pelvis.  She has also continued being followed in our multidisciplinary brain tumor conference, with serial MRI brain scans every 6 months which have continued to show a stable appearance of the 3 mm enhancing nodule in the right lateral cerebellum at the site of previous tumor treatment, consistent with treatment related changes especially in light of its stable appearance over multiple scans since late 2020. There were no other enhancing lesions. Today's visit is to review results from her most recent brain MRI scan performed on 08/01/2021. ? ?On review of systems, the patient reports that she is doing well overall. She denies any chest pain, shortness of breath, cough, fevers, chills, night sweats, or unintended weight changes. She denies any bowel or bladder disturbances, and denies abdominal pain, nausea or vomiting. She denies new visual or auditory changes, dizziness, imbalance, tremors or seizure activity. She has continued having occasional headaches which respond to tylenol or advil if needed.  The headaches became severe back in 10/2018  and she ended up having a wisdom tooth extraction with significant improvement of her HAs. She has some chronic pain in the left groin/hip joint region that radiates down her left leg anteriorly to her foot, unchanged recently.  This pain is exacerbated with activities and improves with rest and does not wake her from sleep at night. An MRI of the hip in October 2020 was without evidence of bony metastatic disease or acute abnormalities.  She reports mild improvement with taking muscle relaxants prn. She denies any new skin lesions or concerns. A complete review of systems is obtained and is otherwise negative. ? ?Past Medical History:  ?Past Medical History:  ?Diagnosis Date  ? Anxiety   ? Arthritis   ? back  ? Back pain   ? Brain cancer [facility])   ? 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 Breast cancer ([facility])   ? Breast cancer of lower-outer quadrant of right female breast ([facility]) 12/05/2014  ? Cancer of right breast metastatic to brain [facility])   ? Depression   ? FH: chemotherapy 12/2014-04/2015  ? Genetic testing 12/10/2016  ? [name] underwent genetic counseling and testing for hereditary cancer syndromes on 11/18/2016. 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 Mutations in MSH6 are associated with a hereditary cancer syndrome called Lynch syndrome. For more detailed discussion, please see genetic counseling documentation from 12/10/2016.  Testing was perfo  ? Headache   ? due to brain cancer, no  longer having them  ? Hot flashes   ? Hypertension   ? Hypothyroidism   ? 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 [name] underwent genetic counseling and testing for hereditary cancer syndromes on 11/18/2016. 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 Mutations in MSH6 are associated with a hereditary cancer syndrome called Lynch syndrome. For more detailed discussion, please see genetic  counseling documentation from 12/10/2016.  Testing was perfo  ? Radiation 07/23/15-09/05/15  ? right breast 50.4 Gy, boost of 10 Gy  ? ? ?Past Surgical History: ?Past Surgical History:  ?Procedure Laterality Date

## 2021-08-05 NOTE — Telephone Encounter (Signed)
Scheduled appointment per 3/14 los. Patient is aware. ?

## 2021-08-12 ENCOUNTER — Other Ambulatory Visit: Payer: Self-pay | Admitting: Hematology and Oncology

## 2021-08-26 ENCOUNTER — Encounter: Payer: Self-pay | Admitting: Hematology and Oncology

## 2021-08-27 ENCOUNTER — Encounter: Payer: Self-pay | Admitting: Hematology and Oncology

## 2021-09-01 ENCOUNTER — Encounter: Payer: Self-pay | Admitting: Podiatry

## 2021-09-01 ENCOUNTER — Ambulatory Visit: Payer: No Typology Code available for payment source | Admitting: Podiatry

## 2021-09-01 ENCOUNTER — Ambulatory Visit (INDEPENDENT_AMBULATORY_CARE_PROVIDER_SITE_OTHER): Payer: No Typology Code available for payment source

## 2021-09-01 DIAGNOSIS — M76822 Posterior tibial tendinitis, left leg: Secondary | ICD-10-CM | POA: Diagnosis not present

## 2021-09-01 DIAGNOSIS — M7751 Other enthesopathy of right foot: Secondary | ICD-10-CM | POA: Diagnosis not present

## 2021-09-01 DIAGNOSIS — M19071 Primary osteoarthritis, right ankle and foot: Secondary | ICD-10-CM

## 2021-09-01 DIAGNOSIS — M7750 Other enthesopathy of unspecified foot: Secondary | ICD-10-CM

## 2021-09-01 DIAGNOSIS — M2041 Other hammer toe(s) (acquired), right foot: Secondary | ICD-10-CM

## 2021-09-01 DIAGNOSIS — S92501S Displaced unspecified fracture of right lesser toe(s), sequela: Secondary | ICD-10-CM | POA: Diagnosis not present

## 2021-09-01 DIAGNOSIS — M7752 Other enthesopathy of left foot: Secondary | ICD-10-CM | POA: Diagnosis not present

## 2021-09-01 MED ORDER — MELOXICAM 15 MG PO TABS: 15.0000 mg | ORAL_TABLET | Freq: Every day | ORAL | 3 refills | Status: DC

## 2021-09-01 NOTE — Patient Instructions (Signed)
Posterior Tibial Tendinitis ? ?Posterior tibial tendinitis is irritation of a tendon called the posterior tibial tendon. Your posterior tibial tendon is a cord-like tissue that connects bones of your lower leg and foot to a muscle that: ?Supports your arch. ?Helps you raise up on your toes. ?Helps you turn your foot down and in. ?This condition causes foot and ankle pain. It can also lead to a flat foot. ?What are the causes? ?This condition is most often caused by repeated stress to the tendon (overuse injury). It can also be caused by a sudden injury that stresses the tendon, such as landing on your foot after jumping or falling. ?What increases the risk? ?This condition is more likely to develop in: ?People who play a sport that involves putting a lot of pressure on the feet, such as: ?Basketball. ?Tennis. ?Soccer. ?Hockey. ?Runners. ?Females who are older than 54 years of age and are overweight. ?People with diabetes. ?People with decreased foot stability. ?People with flat feet. ?What are the signs or symptoms? ?Symptoms include: ?Pain in the inner ankle. ?Pain at the arch of your foot. ?Pain that gets worse with running, walking, or standing. ?Swelling on the inside of your ankle and foot. ?Weakness in your ankle or foot. ?Inability to stand up on tiptoe. ?Flattening of the arch of your foot. ?How is this diagnosed? ?This condition may be diagnosed based on: ?Your symptoms. ?Your medical history. ?A physical exam. ?Tests, such as: ?X-ray. ?MRI. ?Ultrasound. ?How is this treated? ?This condition may be treated by: ?Putting ice to the injured area. ?Taking NSAIDs, such as ibuprofen, to reduce pain and swelling. ?Wearing a special shoe or shoe insert to support your arch (orthotic). ?Having physical therapy. ?Replacing high-impact exercise with low-impact exercise, such as swimming or cycling. ?If your symptoms do not improve with these treatments, you may need to wear a splint, removable walking boot, or short  leg cast for 6-8 weeks to keep your foot and ankle still (immobilized). ?Follow these instructions at home: ?If you have a cast, splint, or boot: ?Keep it clean and dry. ?Check the skin around it every day. Tell your health care provider about any concerns. ?If you have a cast: ?Do not stick anything inside it to scratch your skin. Doing that increases your risk of infection. ?You may put lotion on dry skin around the edges of the cast. Do not put lotion on the skin underneath the cast. ?If you have a splint or boot: ?Wear it as told by your health care provider. Remove it only as told by your health care provider. ?Loosen it if your toes tingle, become numb, or turn cold and blue. ?Bathing ?Do not take baths, swim, or use a hot tub until your health care provider approves. Ask your health care provider if you may take showers. ?If your cast, splint, or boot is not waterproof: ?Do not let it get wet. ?Cover it with a waterproof covering while you take a bath or a shower. ?Managing pain and swelling ? ? ?If directed, put ice on the injured area. ?If you have a removable splint or boot, remove it as told by your health care provider. ?Put ice in a plastic bag. ?Place a towel between your skin and the bag or between your cast and the bag. ?Leave the ice on for 20 minutes, 2-3 times a day. ?Move your toes often to reduce stiffness and swelling. ?Raise (elevate) the injured area above the level of your heart while you are sitting  or lying down. ?Activity ?Do not use the injured foot to support your body weight until your health care provider says that you can. Use crutches as told by your health care provider. ?Do not do activities that make pain or swelling worse. ?Ask your health care provider when it is safe to drive if you have a cast, splint, or boot on your foot. ?Return to your normal activities as told by your health care provider. Ask your health care provider what activities are safe for you. ?Do exercises as  told by your health care provider. ?General instructions ?Take over-the-counter and prescription medicines only as told by your health care provider. ?If you have an orthotic, use it as told by your health care provider. ?Keep all follow-up visits as told by your health care provider. This is important. ?How is this prevented? ?Wear footwear that is appropriate to your athletic activity. ?Avoid athletic activities that cause pain or swelling in your ankle or foot. ?Before being active, do range-of-motion and stretching exercises. ?If you develop pain or swelling while training, stop training. ?If you have pain or swelling that does not improve after a few days of rest, see your health care provider. ?If you start a new athletic activity, start gradually so you can build up your strength and flexibility. ?Contact a health care provider if: ?Your symptoms get worse. ?Your symptoms do not improve in 6-8 weeks. ?You develop new, unexplained symptoms. ?Your splint, boot, or cast gets damaged. ?Summary ?Posterior tibial tendinitis is irritation of a tendon called the posterior tibial tendon. ?This condition is most often caused by repeated stress to the tendon (overuse injury). ?This condition causes foot pain and ankle pain. It can also lead to a flat foot. ?This condition may be treated by not doing high-impact activities, applying ice, having physical therapy, wearing orthotics, and wearing a cast, splint, or boot if needed. ?This information is not intended to replace advice given to you by your health care provider. Make sure you discuss any questions you have with your health care provider. ?Document Revised: 09/05/2018 Document Reviewed: 07/13/2018 ?Elsevier Patient Education ? [place]. ? ?Posterior Tibial Tendinitis Rehab ?Ask your health care provider which exercises are safe for you. Do exercises exactly as told by your health care provider and adjust them as directed. It is normal to feel mild  stretching, pulling, tightness, or discomfort as you do these exercises. Stop right away if you feel sudden pain or your pain gets worse. Do not begin these exercises until told by your health care provider. ?Stretching and range-of-motion exercises ?These exercises warm up your muscles and joints and improve the movement and flexibility in your ankle and foot. These exercises may also help to relieve pain. ?Standing wall calf stretch, knee straight ? ? ?Stand with your hands against a wall. ?Extend your left / right leg behind you, and bend your front knee slightly. If directed, place a folded washcloth under the arch of your foot for support. ?Point the toes of your back foot slightly inward. ?Keeping your heels on the floor and your back knee straight, shift your weight toward the wall. Do not allow your back to arch. You should feel a gentle stretch in your upper left / right calf. ?Hold this position for 10 seconds. ?Repeat 10 times. Complete this exercise 2 times a day. ?Standing wall calf stretch, knee bent ?Stand with your hands against a wall. ?Extend your left / right leg behind you, and bend your front  knee slightly. If directed, place a folded washcloth under the arch of your foot for support. ?Point the toes of your back foot slightly inward. ?Unlock your back knee so it is bent. Keep your heels on the floor. You should feel a gentle stretch deep in your lower left / right calf. ?Hold this position for 10 seconds. ?Repeat 10 times. Complete this exercise 2 times a day. ?Strengthening exercises ?These exercises build strength and endurance in your ankle and foot. Endurance is the ability to use your muscles for a long time, even after they get tired. ?Ankle inversion with band ?Secure one end of a rubber exercise band or tubing to a fixed object, such as a table leg or a pole, that will stay still when the band is pulled. ?Loop the other end of the band around the middle of your left / right foot. ?Sit  on the floor facing the object with your left / right leg extended. The band or tube should be slightly tense when your foot is relaxed. ?Leading with your big toe, slowly bring your left / right foot and ankle

## 2021-09-01 NOTE — Progress Notes (Signed)
?  Subjective:  ?Patient ID: Luanne Krzyzanowski, female    DOB: 08-Jun-1965,  MRN: [id number] ? ?Chief Complaint  ?Patient presents with  ? Foot Pain  ?  NP BIL foot pain , not diabetic  ? ? ?56 y.o. female presents with the above complaint. History confirmed with patient.  She has 2 primary issues less so is pain that radiates from the inside of the arch up along the ankle.  She also has significant pain and swelling in the second toe of the right foot which is always stiff.  Does not recall any particular injury ? ?Objective:  ?Physical Exam: ?warm, good capillary refill, no trophic changes or ulcerative lesions, normal DP and PT pulses, and normal sensory exam. ?Left Foot: Mild pain at the insertion of the posterior tibial tendon she has good 5 out of 5 strength ?Right Foot: Pain swelling and limited range of motion centered around the PIPJ of the second right toe ? ?No images are attached to the encounter. ? ?Radiographs: ?Multiple views x-ray of both feet: On the left foot there is no acute osseous abnormalities or major degenerative changes there is no definitive accessory navicular noted, on the right foot there is significant arthrosis and para-articular spurring of the second PIPJ looks like previously malunited intra-articular fracture ?Assessment:  ? ?1. Posterior tibial tendinitis of left lower extremity   ?2. Hammertoe of right foot   ?3. Arthritis of joint of lesser toe, right   ?4. Closed fracture of phalanx of right second toe, sequela   ? ? ? ?Plan:  ?Patient was evaluated and treated and all questions answered. ? ?I did my clinical and radiographic findings with the patient in detail.  We discussed the presence of PT tendinitis on the left foot and this is relatively mild for I recommended an anti-inflammatory and prescribed her meloxicam.  I also recommended a home therapy plan and dispensed exercises to her.  Hopefully this is going to resolve on its own. ? ?Regarding her right foot second toe pain  which is more severe it looks like she has a likely intra-articular healed fracture with osteoarthritis of the PIPJ.  Clinically there is limited range of motion here.  We discussed the option of managing the symptoms and corticosteroid injection but I think this would offer minimal long-term relief.  I discussed surgical correction with arthrodesis of the PIPJ.  We discussed the risk benefits and potential complications including but not limited to pain, swelling, infection, scar, numbness which may be temporary or permanent, chronic pain, stiffness, nerve pain or damage, wound healing problems, bone healing problems including delayed or non-union.  All questions were addressed.  Informed consent was signed and reviewed ? ? ?Surgical plan: ? ?Procedure: ?-Right second hammertoe PIPJ arthrodesis ? ?Location: ?-[facility] ? ?Anesthesia plan: ?-IV sedation with local ? ?Postoperative pain plan: ?-Seglentis 2 tablets twice daily ? ?DVT prophylaxis: ?-None required ? ?WB Restrictions / DME needs: ?-WBAT in surgical shoe this was dispensed today ? ? ?No follow-ups on file.  ? ?

## 2021-09-03 NOTE — Progress Notes (Addendum)
? ?New Patient Office Visit ? ?Subjective:  ?Patient ID: Kaitlyn Keith, female    DOB: May 28, 1965  Age: 56 y.o. MRN: [id number] ? ?CC:  ?Chief Complaint  ?Patient presents with  ? Establish Care  ?  NP/establish care discuss thyroid levels would like labs, persistent cough x 3 days. Patient fasting.   ? ? ?HPI ?Kaitlyn Keith presents for new patient visit to establish care.  Introduced to Designer, jewellery role and practice setting.  All questions answered.  Discussed provider/patient relationship and expectations. ? 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 She was taking immunotherapy for 3 years and has not needed to take anything the last 2 years. She still goes every 6 months to her oncologist, but has been cancer free for 5 years.  ? ?She is interested in medication for wegiht loss. She doesn't eat red meat. She stays outside frequently and is very active. She drinks tea and uses honey and cane sugars. She has used Phentermine in the past. She is interested in starting another medication for weight loss and was told that Mancel Parsons is covered under her insurance plan.  ? ?She has been coughing for the last 3 days. She endorses post nasal drip and a sore throat. She denies fever, nasal congestion, and ear pain. She has tried Lobbyist and zyrtec. She would like a prescription medication to help with her cough as it is interrupting her sleep.  ? ?Kaitlyn Keith has a history of hypertension. She does not check her blood pressure at home. She denies chest pain, shortness of breath, and headaches.  ? ?Depression and Anxiety Screen done: ? ? ?  09/04/2021  ?  2:02 PM 09/04/2021  ? 12:59 PM 07/06/2017  ?  7:42 AM 06/08/2017  ?  2:54 PM 02/21/2017  ? 10:34 AM  ?Depression screen PHQ 2/9  ?Decreased Interest 0 0 0 0 0  ?Down, Depressed, Hopeless 0 0 0 0 1  ?PHQ - 2 Score 0 0 0 0 1  ?Altered sleeping 3      ?Tired, decreased energy 2      ?Change in appetite 0      ?Feeling bad  or failure about yourself  0      ?Trouble concentrating 2      ?Moving slowly or fidgety/restless 0      ?Suicidal thoughts 0      ?PHQ-9 Score 7      ?Difficult doing work/chores Not difficult at all      ? ? ?  09/04/2021  ?  2:02 PM  ?GAD 7 : Generalized Anxiety Score  ?Nervous, Anxious, on Edge 0  ?Control/stop worrying 0  ?Worry too much - different things 3  ?Trouble relaxing 0  ?Restless 0  ?Easily annoyed or irritable 0  ?Afraid - awful might happen 0  ?Total GAD 7 Score 3  ?Anxiety Difficulty Not difficult at all  ? ? ?Past Medical History:  ?Diagnosis Date  ? Anxiety   ? Arthritis   ? back  ? Back pain   ? Brain cancer [facility])   ? 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 Breast cancer ([place])   ? Breast cancer of lower-outer quadrant of right female breast ([place]) 12/05/2014  ? Cancer of right breast metastatic to brain [facility])   ? Depression   ? FH: chemotherapy 12/2014-04/2015  ? Genetic testing 12/10/2016  ? [name] underwent genetic counseling and testing for hereditary  cancer syndromes on 11/18/2016. 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 Mutations in MSH6 are associated with a hereditary cancer syndrome called Lynch syndrome. For more detailed discussion, please see genetic counseling documentation from 12/10/2016.  Testing was perfo  ? Headache   ? due to brain cancer, no longer having them  ? Hot flashes   ? Hypertension   ? Hypothyroidism   ? 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 [name] underwent genetic counseling and testing for hereditary cancer syndromes on 11/18/2016. 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 Mutations in MSH6 are associated with a hereditary cancer syndrome called Lynch syndrome. For more detailed discussion, please see genetic counseling documentation from 12/10/2016.  Testing was perfo  ? Radiation 07/23/15-09/05/15  ? right breast 50.4 Gy,  boost of 10 Gy  ? ? ?Past Surgical History:  ?Procedure Laterality Date  ? APPLICATION OF CRANIAL NAVIGATION Right 07/27/2016  ? Procedure: APPLICATION OF CRANIAL NAVIGATION;  Surgeon: Kevan Ny Ditty, MD;  Location: [place];  Service: Neurosurgery;  Laterality: Right;  ? BIOPSY OF SKIN SUBCUTANEOUS TISSUE AND/OR MUCOUS MEMBRANE Left 12/24/2016  ? Procedure: OPEN BIOPSY LESIONS ON LEFT LOWER BACK AND SHOULDER BLADE;  Surgeon: [name], MD;  Location: [place];  Service: General;  Laterality: Left;  ? BREAST LUMPECTOMY WITH NEEDLE LOCALIZATION AND AXILLARY SENTINEL LYMPH NODE BX Right 06/16/2015  ? Procedure: BREAST LUMPECTOMY WITH NEEDLE LOCALIZATION AND AXILLARY SENTINEL LYMPH NODE BX;  Surgeon: [name], MD;  Location: [place];  Service: General;  Laterality: Right;  ? BREAST REDUCTION SURGERY    ? CRANIOTOMY Right 07/27/2016  ? Procedure: Right Suboccipital craniotomy for tumor resection with stereotactic navigation;  Surgeon: Kevan Ny Ditty, MD;  Location: [place];  Service: Neurosurgery;  Laterality: Right;  ? PORT-A-CATH REMOVAL N/A 06/16/2015  ? Procedure: REMOVAL PORT-A-CATH;  Surgeon: [name], MD;  Location: [place];  Service: General;  Laterality: N/A;  ? PORT-A-CATH REMOVAL Left 04/21/2020  ? Procedure: REMOVAL PORT-A-CATH;  Surgeon: [name], MD;  Location: [place];  Service: General;  Laterality: Left;  ? PORTACATH PLACEMENT N/A 12/23/2014  ? Procedure: INSERTION PORT-A-CATH;  Surgeon: [name], MD;  Location: [place];  Service: General;  Laterality: N/A;  ? PORTACATH PLACEMENT Left 07/08/2017  ? Procedure: INSERTION PORT-A-CATH;  Surgeon: [name], MD;  Location: [place];  Service: General;  Laterality: Left;  ? ? ?Family History  ?Problem Relation Age of Onset  ? Aneurysm Mother 48  ?     d.55  ? Heart attack Father 57  ?     d.62  ? Endometrial cancer Sister 109  ? Lung cancer Maternal  Uncle   ?     d.68s  ? Cancer Maternal Grandmother   ?     unspecified type-possibly stomach d.88s  ? ? ?Social History  ? ?Socioeconomic History  ? Marital status: Married  ?  Spouse name: Not on file  ? Number of children: 1  ? Years of education: Not on file  ? Highest education level: Not on file  ?Occupational History  ? Not on file  ?Tobacco Use  ? Smoking status: Never  ? Smokeless tobacco: Never  ?Vaping Use  ? Vaping Use: Never used  ?Substance and Sexual Activity  ? Alcohol use: Yes  ?  Comment: social  ? Drug  use: No  ? Sexual activity: Yes  ?  Birth control/protection: None  ?Other Topics Concern  ? Not on file  ?Social History Narrative  ? Not on file  ? ?Social Determinants of Health  ? ?Financial Resource Strain: Not on file  ?Food Insecurity: Not on file  ?Transportation Needs: Not on file  ?Physical Activity: Not on file  ?Stress: Not on file  ?Social Connections: Not on file  ?Intimate Partner Violence: Not on file  ? ? ?ROS ?Review of Systems  ?Constitutional:  Positive for fatigue. Negative for fever.  ?HENT:  Positive for congestion, postnasal drip and sore throat. Negative for ear pain.   ?Eyes: Negative.   ?Respiratory:  Positive for cough. Negative for shortness of breath.   ?Cardiovascular: Negative.   ?Gastrointestinal: Negative.   ?Genitourinary: Negative.   ?Musculoskeletal: Negative.   ?Skin: Negative.   ?Neurological: Negative.   ?Psychiatric/Behavioral: Negative.    ? ?Objective:  ? ?Today's Vitals: BP 126/84 (BP Location: Left Arm, Patient Position: Sitting, Cuff Size: Large)   Pulse 70   Temp (!) 97.3 ?F (36.3 ?C) (Temporal)   Ht '5\' 3"'  (1.6 m)   Wt 182 lb 3.2 oz (82.6 kg)   SpO2 96%   BMI 32.28 kg/m?  ? ?Physical Exam ?Vitals and nursing note reviewed.  ?Constitutional:   ?   General: She is not in acute distress. ?   Appearance: Normal appearance.  ?HENT:  ?   Head: Normocephalic and atraumatic.  ?   Right Ear: Tympanic membrane, ear canal and external ear normal.  ?   Left  Ear: Tympanic membrane normal. There is impacted cerumen.  ?   Nose: Nose normal.  ?   Mouth/Throat:  ?   Mouth: Mucous membranes are moist.  ?   Pharynx: Oropharynx is clear.  ?Eyes:  ?   Conjunctiva/sclera: Co

## 2021-09-04 ENCOUNTER — Encounter: Payer: Self-pay | Admitting: Nurse Practitioner

## 2021-09-04 ENCOUNTER — Ambulatory Visit (INDEPENDENT_AMBULATORY_CARE_PROVIDER_SITE_OTHER): Payer: No Typology Code available for payment source | Admitting: Nurse Practitioner

## 2021-09-04 VITALS — BP 126/84 | HR 70 | Temp 97.3°F | Ht 63.0 in | Wt 182.2 lb

## 2021-09-04 DIAGNOSIS — Z13220 Encounter for screening for lipoid disorders: Secondary | ICD-10-CM

## 2021-09-04 DIAGNOSIS — E039 Hypothyroidism, unspecified: Secondary | ICD-10-CM | POA: Insufficient documentation

## 2021-09-04 DIAGNOSIS — E559 Vitamin D deficiency, unspecified: Secondary | ICD-10-CM | POA: Insufficient documentation

## 2021-09-04 DIAGNOSIS — R5383 Other fatigue: Secondary | ICD-10-CM

## 2021-09-04 DIAGNOSIS — Z136 Encounter for screening for cardiovascular disorders: Secondary | ICD-10-CM

## 2021-09-04 DIAGNOSIS — E669 Obesity, unspecified: Secondary | ICD-10-CM | POA: Diagnosis not present

## 2021-09-04 DIAGNOSIS — Z853 Personal history of malignant neoplasm of breast: Secondary | ICD-10-CM | POA: Insufficient documentation

## 2021-09-04 DIAGNOSIS — I10 Essential (primary) hypertension: Secondary | ICD-10-CM

## 2021-09-04 DIAGNOSIS — G4700 Insomnia, unspecified: Secondary | ICD-10-CM | POA: Insufficient documentation

## 2021-09-04 DIAGNOSIS — J069 Acute upper respiratory infection, unspecified: Secondary | ICD-10-CM

## 2021-09-04 MED ORDER — WEGOVY 0.25 MG/0.5ML ~~LOC~~ SOAJ: 0.2500 mg | SUBCUTANEOUS | 1 refills | Status: DC

## 2021-09-04 MED ORDER — HYDROCOD POLI-CHLORPHE POLI ER 10-8 MG/5ML PO SUER: 5.0000 mL | Freq: Two times a day (BID) | ORAL | 0 refills | Status: DC | PRN

## 2021-09-04 NOTE — Assessment & Plan Note (Signed)
BMI 32.2 today. Discussed diet and exercise. She is interested in starting medication to help with weight loss. She tried phentermine in the past but didn't like the way it made her feel and it increased her blood pressure. Will start [facility] 0.'25mg'$  injection once a week. Discussed possible side effects. Will check A1C today. Follow up in 6-8 weeks. Goal is to lose 1-2 pounds per week.  ?

## 2021-09-04 NOTE — Patient Instructions (Signed)
It was great to see you! ? ?We are checking her labs today we will let you know the results. ? ?Start Wegovy 0.25 mg weekly injection for 4 weeks, then increase to 0.5 mg weekly.  This may take a little time to get to your pharmacy as we usually have to complete forms for your insurance company.  Continue your healthy eating and exercise habits already. ? ?I have sent some cough medicine with codeine to your pharmacy.  Do not take this if you take your Xanax.  This medication can make you sleepy. ? ?Let's follow-up in 6 to 8 weeks, sooner if you have concerns. ? ?If a referral was placed today, you will be contacted for an appointment. Please note that routine referrals can sometimes take up to 3-4 weeks to process. Please call our office if you haven't heard anything after this time frame. ? ?Take care, ? ?Vance Peper, NP ? ?

## 2021-09-04 NOTE — Assessment & Plan Note (Signed)
Chronic, stable. She takes hydroxyzine '25mg'$  as needed at bedtime to help her sleep and help with anxiety. She also takes xanax '2mg'$  at bedtime as needed. She states that she does not take this medication often. Follow up with any concerns.  ?

## 2021-09-04 NOTE — Assessment & Plan Note (Signed)
Chronic, stable. Continue lisinopril-hctz 20-12.'5mg'$  daily. Last CMP and CBC reviewed from last month. Follow-up in 6 months.  ?

## 2021-09-04 NOTE — Assessment & Plan Note (Signed)
Chronic, stable. Continue levothyroxine 112 mcg daily. Will check TSH and T4 today as she has been having fatigue recently and adjust regimen based on results.  ?

## 2021-09-04 NOTE — Assessment & Plan Note (Signed)
History of vitamin D deficiency, will check vitamin D levels today and treat based on results.  ?

## 2021-09-04 NOTE — Assessment & Plan Note (Signed)
She has a history of breast cancer with mets to the brain and skin. She was taking immunotherapy for 3 years, however has not needed treatment in the last 2 years. She follows with oncology every 6 months. Continue collaboration and recommendation from oncology.  ?

## 2021-09-05 LAB — LIPID PANEL
Cholesterol: 240 mg/dL — ABNORMAL HIGH (ref <200)
HDL: 73 mg/dL (ref >=50)
LDL Cholesterol (Calc): 142 mg/dL (calc) — ABNORMAL HIGH
Non-HDL Cholesterol (Calc): 167 mg/dL (calc) — ABNORMAL HIGH (ref <130)
Total CHOL/HDL Ratio: 3.3 (calc) (ref <5.0)
Triglycerides: 126 mg/dL (ref <150)

## 2021-09-05 LAB — VITAMIN D 25 HYDROXY (VIT D DEFICIENCY, FRACTURES): Vit D, 25-Hydroxy: 40 ng/mL (ref 30–100)

## 2021-09-05 LAB — HEMOGLOBIN A1C
Hgb A1c MFr Bld: 5.4 % of total Hgb (ref <5.7)
Mean Plasma Glucose: 108 mg/dL
eAG (mmol/L): 6 mmol/L

## 2021-09-05 LAB — T4, FREE: Free T4: 0.6 ng/dL — ABNORMAL LOW (ref 0.8–1.8)

## 2021-09-05 LAB — VITAMIN B12: Vitamin B-12: 1097 pg/mL (ref 200–1100)

## 2021-09-05 LAB — TSH: TSH: 62.59 mIU/L — ABNORMAL HIGH

## 2021-09-07 ENCOUNTER — Telehealth: Payer: Self-pay

## 2021-09-07 ENCOUNTER — Other Ambulatory Visit: Payer: Self-pay | Admitting: Nurse Practitioner

## 2021-09-07 MED ORDER — LEVOTHYROXINE SODIUM 125 MCG PO TABS: 125.0000 ug | ORAL_TABLET | Freq: Every day | ORAL | 0 refills | Status: DC

## 2021-09-07 NOTE — Telephone Encounter (Signed)
PA for [facility] submitted through cover my meds to [employer].  Awaiting response.  Dm/cma ? ? ?Key: [id number] ?

## 2021-09-07 NOTE — Telephone Encounter (Signed)
PA approved form 09/07/21- 04/09/22.  Pharmacy notified [place] phone. Dm/cma ? ?

## 2021-09-07 NOTE — Telephone Encounter (Signed)
Left VM to rtn call. Dm/cma       

## 2021-09-07 NOTE — Addendum Note (Signed)
Addended by: Vance Peper A on: 09/07/2021 12:54 PM ? ? Modules accepted: Orders ? ?

## 2021-09-09 NOTE — Telephone Encounter (Signed)
Spoke to patient and was advised that she did get it. Dm/cma ? ?

## 2021-10-02 ENCOUNTER — Telehealth: Payer: Self-pay | Admitting: Nurse Practitioner

## 2021-10-02 MED ORDER — WEGOVY 0.5 MG/0.5ML ~~LOC~~ SOAJ
0.5000 mg | SUBCUTANEOUS | 0 refills | Status: DC
Start: 2021-10-02 — End: 2021-10-05

## 2021-10-02 NOTE — Telephone Encounter (Signed)
Caller Name: Derisha Funderburke ?Call back phone #: [phone] ? ?MEDICATION(S): wegovy, 0.'5mg'$  ? ? ?Has the patient contacted their pharmacy (YES/NO)?  Yes, pharmacy needs new Rx ? ? ?~~~Please advise patient/caregiver to allow 2-3 business days to process RX refills.  ?

## 2021-10-02 NOTE — Telephone Encounter (Signed)
Spoke to patient and resolved issue.  

## 2021-10-05 ENCOUNTER — Telehealth: Payer: Self-pay | Admitting: Nurse Practitioner

## 2021-10-05 MED ORDER — WEGOVY 0.25 MG/0.5ML ~~LOC~~ SOAJ: 0.5000 mg | SUBCUTANEOUS | 0 refills | Status: DC

## 2021-10-05 MED ORDER — WEGOVY 0.5 MG/0.5ML ~~LOC~~ SOAJ: 0.5000 mg | SUBCUTANEOUS | 0 refills | Status: DC

## 2021-10-05 NOTE — Telephone Encounter (Signed)
Called and spoke to patient. All concerns resolved ?

## 2021-10-05 NOTE — Addendum Note (Signed)
Addended by: Vance Peper A on: 10/05/2021 03:38 PM ? ? Modules accepted: Orders ? ?

## 2021-10-05 NOTE — Telephone Encounter (Signed)
[facility] [address] [facility] it is on back order. [facility] [address] and can you please send it there and call her to let her know which. ? ?

## 2021-10-15 ENCOUNTER — Encounter: Payer: No Typology Code available for payment source | Admitting: Podiatry

## 2021-10-29 ENCOUNTER — Encounter: Payer: No Typology Code available for payment source | Admitting: Podiatry

## 2021-10-30 ENCOUNTER — Other Ambulatory Visit: Payer: Self-pay | Admitting: Hematology and Oncology

## 2021-10-30 ENCOUNTER — Other Ambulatory Visit: Payer: Self-pay | Admitting: Nurse Practitioner

## 2021-10-30 DIAGNOSIS — C50511 Malignant neoplasm of lower-outer quadrant of right female breast: Secondary | ICD-10-CM

## 2021-10-30 NOTE — Telephone Encounter (Signed)
Pt asking for follow up call on dosing and how to take [facility]   Please call [phone]

## 2021-10-30 NOTE — Telephone Encounter (Signed)
Called and spoke to patient, [facility] and they do not know when more will be received. Pt used last dose today. Explained to patient that I will notify her pcp and reach out to her pharmacy to confirm medication on back order.

## 2021-11-02 ENCOUNTER — Other Ambulatory Visit: Payer: Self-pay

## 2021-11-02 MED ORDER — WEGOVY 0.25 MG/0.5ML ~~LOC~~ SOAJ
0.5000 mg | SUBCUTANEOUS | 0 refills | Status: DC
Start: 2021-11-02 — End: 2022-05-20

## 2021-11-02 NOTE — Progress Notes (Signed)
Called and informed patient of provider recommendations, Pt stated she will wait for the prescribed dosage of Wegovy to become available.

## 2021-11-03 ENCOUNTER — Other Ambulatory Visit: Payer: Self-pay | Admitting: Nurse Practitioner

## 2021-11-04 NOTE — Telephone Encounter (Signed)
Medication is on back order pt has been notified

## 2021-11-05 ENCOUNTER — Encounter: Payer: No Typology Code available for payment source | Admitting: Podiatry

## 2021-11-06 ENCOUNTER — Telehealth: Payer: Self-pay | Admitting: Urology

## 2021-11-06 NOTE — Telephone Encounter (Signed)
DOS - 12/04/21  HAMMERTOE REPAIR 2ND RIGHT --- [id number]  [employer] EFFECTIVE DATE - 05/25/19  PLAN DEDUCTIBLE - $800.00 W/ $0.00 REMAINING OUT OF POCKET - $5,000.00 W/ $2,826.16 REMAINING COINSURANCE - 20% COPAY - $0.00   PER [employer]'S AUTOMATIVE SYSTEM FOR CPT CODE 34196 NO PRIOR AUTH IS REQUIRED.  REF # [id number]

## 2021-11-12 ENCOUNTER — Encounter: Payer: No Typology Code available for payment source | Admitting: Podiatry

## 2021-11-19 ENCOUNTER — Encounter: Payer: No Typology Code available for payment source | Admitting: Podiatry

## 2021-12-01 ENCOUNTER — Encounter: Payer: No Typology Code available for payment source | Admitting: Podiatry

## 2021-12-04 ENCOUNTER — Other Ambulatory Visit: Payer: Self-pay | Admitting: Podiatry

## 2021-12-04 DIAGNOSIS — M2041 Other hammer toe(s) (acquired), right foot: Secondary | ICD-10-CM | POA: Diagnosis not present

## 2021-12-04 MED ORDER — ACETAMINOPHEN 500 MG PO TABS: 1000.0000 mg | ORAL_TABLET | Freq: Four times a day (QID) | ORAL | 0 refills | Status: AC | PRN

## 2021-12-04 MED ORDER — OXYCODONE HCL 5 MG PO TABS
5.0000 mg | ORAL_TABLET | ORAL | 0 refills | Status: AC | PRN
Start: 2021-12-04 — End: 2021-12-11

## 2021-12-04 MED ORDER — IBUPROFEN 600 MG PO TABS: 600.0000 mg | ORAL_TABLET | Freq: Four times a day (QID) | ORAL | 0 refills | Status: AC | PRN

## 2021-12-04 NOTE — Progress Notes (Signed)
12/04/21 right 2nd HT correction

## 2021-12-10 ENCOUNTER — Ambulatory Visit (INDEPENDENT_AMBULATORY_CARE_PROVIDER_SITE_OTHER): Payer: No Typology Code available for payment source

## 2021-12-10 ENCOUNTER — Ambulatory Visit (INDEPENDENT_AMBULATORY_CARE_PROVIDER_SITE_OTHER): Payer: No Typology Code available for payment source | Admitting: Podiatry

## 2021-12-10 DIAGNOSIS — M2041 Other hammer toe(s) (acquired), right foot: Secondary | ICD-10-CM | POA: Diagnosis not present

## 2021-12-10 NOTE — Progress Notes (Signed)
  Subjective:  Patient ID: Kaitlyn Keith, female    DOB: 1965-09-11,  MRN: [id number]  Chief Complaint  Patient presents with   Routine Post Op    POV #1 DOS 12/04/2021 HAMMERTOE CORRECTION/REMOVAL ARTHRITIS RT 2ND TOE     56 y.o. female returns for post-op check.  Overall doing well not having much pain  Review of Systems: Negative except as noted in the HPI. Denies N/V/F/Ch.   Objective:  There were no vitals filed for this visit. There is no height or weight on file to calculate BMI. Constitutional Well developed. Well nourished.  Vascular Foot warm and well perfused. Capillary refill normal to all digits.  Calf is soft and supple, no posterior calf or knee pain, negative Homans' sign  Neurologic Normal speech. Oriented to person, place, and time. Epicritic sensation to light touch grossly present bilaterally.  Dermatologic Skin healing well without signs of infection. Skin edges well coapted without signs of infection.  Orthopedic: Tenderness to palpation noted about the surgical site.   Multiple view plain film radiographs: Good correction with arthrodesis site intact and Kirschner wire across arthrodesis site Assessment:   1. Hammertoe of right foot    Plan:  Patient was evaluated and treated and all questions answered.  S/p foot surgery right -Progressing as expected post-operatively. -XR: Noted above -WB Status: WBAT in surgical shoe -Sutures: Plan remove in 2 weeks. -Medications: No refills required -Foot redressed.  Return in about 2 weeks (around 12/24/2021) for suture removal, post op (no x-rays).

## 2021-12-18 LAB — HM MAMMOGRAPHY: HM Mammogram: NORMAL (ref 0–4)

## 2021-12-22 ENCOUNTER — Encounter: Payer: No Typology Code available for payment source | Admitting: Podiatry

## 2021-12-23 ENCOUNTER — Ambulatory Visit (INDEPENDENT_AMBULATORY_CARE_PROVIDER_SITE_OTHER): Payer: No Typology Code available for payment source | Admitting: Podiatry

## 2021-12-23 DIAGNOSIS — M2041 Other hammer toe(s) (acquired), right foot: Secondary | ICD-10-CM

## 2021-12-24 ENCOUNTER — Encounter: Payer: Self-pay | Admitting: Podiatry

## 2021-12-24 ENCOUNTER — Encounter: Payer: No Typology Code available for payment source | Admitting: Podiatry

## 2021-12-24 NOTE — Progress Notes (Signed)
  Subjective:  Patient ID: Kaitlyn Keith, female    DOB: [birth year],  MRN: [id number]  Chief Complaint  Patient presents with   Routine Post Op      POV #2 Return in about 2 weeks for suture removal, post op DOS 12/04/21     56 y.o. female returns for post-op check.  Overall doing well not having much pain  Review of Systems: Negative except as noted in the HPI. Denies N/V/F/Ch.   Objective:  There were no vitals filed for this visit. There is no height or weight on file to calculate BMI. Constitutional Well developed. Well nourished.  Vascular Foot warm and well perfused. Capillary refill normal to all digits.  Calf is soft and supple, no posterior calf or knee pain, negative Homans' sign  Neurologic Normal speech. Oriented to person, place, and time. Epicritic sensation to light touch grossly present bilaterally.  Dermatologic Skin healing well without signs of infection. Skin edges well coapted without signs of infection.  Orthopedic: Tenderness to palpation noted about the surgical site.   Multiple view plain film radiographs: Good correction with arthrodesis site intact and Kirschner wire across arthrodesis site Assessment:   1. Hammertoe of right foot    Plan:  Patient was evaluated and treated and all questions answered.  S/p foot surgery right -Doing well sutures removed today.  Continue WBAT in the surgical shoe.  I will see her back in 3 weeks for new x-rays and removal of the pin  Return in about 3 weeks (around 01/13/2022) for post op (new x-rays), pul lpin .

## 2022-01-11 ENCOUNTER — Telehealth: Payer: Self-pay | Admitting: Nurse Practitioner

## 2022-01-11 NOTE — Telephone Encounter (Signed)
Called and scheduled pt for f/u appt w/ pcp regarding medications needed for international travel. Sw, cma

## 2022-01-11 NOTE — Telephone Encounter (Signed)
Caller Name: Citlalic Norlander  Call back phone #: [phone]  Reason for Call: Please call pt to speak about  Obtaining anti malaria med. She is going to [place] soon and has questions about this

## 2022-01-12 ENCOUNTER — Telehealth (INDEPENDENT_AMBULATORY_CARE_PROVIDER_SITE_OTHER): Payer: No Typology Code available for payment source | Admitting: Nurse Practitioner

## 2022-01-12 ENCOUNTER — Encounter: Payer: Self-pay | Admitting: Nurse Practitioner

## 2022-01-12 DIAGNOSIS — Z298 Encounter for other specified prophylactic measures: Secondary | ICD-10-CM

## 2022-01-12 MED ORDER — ATOVAQUONE-PROGUANIL HCL 250-100 MG PO TABS
1.0000 | ORAL_TABLET | Freq: Every day | ORAL | 0 refills | Status: DC
Start: 2022-01-12 — End: 2022-05-20

## 2022-01-12 NOTE — Patient Instructions (Signed)
It was great to see you!  Start malarone daily 2 days before you leave for [place], every day while in [place], and then daily for 7 days when you get home. I am giving you a few extra pills in case your flight gets delayed.   Have a great trip!   Let's follow-up with any concerns,  Take care,  Vance Peper, NP

## 2022-01-12 NOTE — Progress Notes (Signed)
[facility] [address] [place] [address] Dept: [phone] Dept Fax: [phone]  Virtual Video Visit  I connected with Shireen Quan on 01/12/22 at 10:00 AM EDT by a video enabled telemedicine application and verified that I am speaking with the correct person using two identifiers.  Location patient: Home Location provider: Clinic Persons participating in the virtual visit: Patient; Vance Peper, NP; Marchia Bond, CMA  I discussed the limitations of evaluation and management by telemedicine and the availability of in person appointments. The patient expressed understanding and agreed to proceed.  Chief Complaint  Patient presents with   Follow-up    Pt requesting anti malaria med, review for medications needed for international travel. Sw, cma    SUBJECTIVE:  HPI: Kaitlyn Keith is a 56 y.o. female who presents to request malarial medication before she goes to [place]. She is leaving next Wednesday and will be there for 3 weeks. She states that she has received hepatitis A vaccines in the past. She has been to [place] several times.   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[place]) 07/27/2016   C2 cervical fracture ([place]) 07/12/2016   Hypertension 07/12/2016   Degenerative spinal arthritis 11/18/2015   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   Paronychia of great toe, left 02/04/2015   Breast cancer of lower-outer quadrant of  right female breast ([place]) 12/05/2014    Past Surgical History:  Procedure Laterality Date   APPLICATION OF CRANIAL NAVIGATION Right 07/27/2016   Procedure: APPLICATION OF CRANIAL NAVIGATION;  Surgeon: Kevan Ny Ditty, MD;  Location: [facility];  Service: Neurosurgery;  Laterality: Right;   BIOPSY OF SKIN SUBCUTANEOUS TISSUE AND/OR MUCOUS MEMBRANE Left 12/24/2016   Procedure: OPEN BIOPSY LESIONS ON LEFT LOWER BACK AND SHOULDER BLADE;  Surgeon: [name], MD;  Location: [facility];  Service: General;  Laterality: Left;   BREAST LUMPECTOMY WITH NEEDLE LOCALIZATION AND AXILLARY SENTINEL LYMPH NODE BX Right 06/16/2015   Procedure: BREAST LUMPECTOMY WITH NEEDLE LOCALIZATION AND AXILLARY SENTINEL LYMPH NODE BX;  Surgeon: [name], MD;  Location: [facility];  Service: General;  Laterality: Right;   BREAST REDUCTION SURGERY     CRANIOTOMY Right 07/27/2016   Procedure: Right Suboccipital craniotomy for tumor resection with stereotactic navigation;  Surgeon: Kevan Ny Ditty, MD;  Location: [facility];  Service: Neurosurgery;  Laterality: Right;   PORT-A-CATH REMOVAL N/A 06/16/2015   Procedure: REMOVAL PORT-A-CATH;  Surgeon: [name], MD;  Location: [facility];  Service: General;  Laterality: N/A;   PORT-A-CATH REMOVAL Left 04/21/2020   Procedure: REMOVAL PORT-A-CATH;  Surgeon: [name], MD;  Location: [facility];  Service: General;  Laterality: Left;   PORTACATH PLACEMENT N/A 12/23/2014   Procedure: INSERTION PORT-A-CATH;  Surgeon: [name], MD;  Location: [facility];  Service: General;  Laterality: N/A;   PORTACATH PLACEMENT Left 07/08/2017   Procedure: INSERTION PORT-A-CATH;  Surgeon: [name], MD;  Location: [facility];  Service:  General;  Laterality: Left;    Family History  Problem Relation Age of Onset   Aneurysm Mother 26       d.55   Heart attack Father 30       d.62   Endometrial cancer Sister 65    Lung cancer Maternal Uncle        d.68s   Cancer Maternal Grandmother        unspecified type-possibly stomach d.88s    Social History   Tobacco Use   Smoking status: Never   Smokeless tobacco: Never  Vaping Use   Vaping Use: Never used  Substance Use Topics   Alcohol use: Yes    Comment: social   Drug use: No     Current Outpatient Medications:    alprazolam (XANAX) 2 MG tablet, Take 1 tablet (2 mg total) by mouth at bedtime., Disp: 30 tablet, Rfl: 3   atovaquone-proguanil (MALARONE) 250-100 MG TABS tablet, Take 1 tablet by mouth daily. Take 2 days before your trip, all during your trip, and 7 days after you get home, Disp: 45 tablet, Rfl: 0   chlorpheniramine-HYDROcodone (TUSSIONEX PENNKINETIC ER) 10-8 MG/5ML, Take 5 mLs by mouth every 12 (twelve) hours as needed for cough., Disp: 70 mL, Rfl: 0   hydrOXYzine (ATARAX) 25 MG tablet, TAKE 1 TABLET (25 MG TOTAL) BY MOUTH AT BEDTIME AS NEEDED FOR ANXIETY., Disp: 90 tablet, Rfl: 2   levothyroxine (SYNTHROID) 125 MCG tablet, TAKE 1 TABLET BY MOUTH EVERY DAY, Disp: 90 tablet, Rfl: 1   lisinopril-hydrochlorothiazide (ZESTORETIC) 20-12.5 MG tablet, TAKE 1 TABLET BY MOUTH TWICE A DAY, Disp: 180 tablet, Rfl: 3   meloxicam (MOBIC) 15 MG tablet, Take 1 tablet (15 mg total) by mouth daily. (Patient not taking: Reported on 01/12/2022), Disp: 30 tablet, Rfl: 3   Semaglutide-Weight Management (WEGOVY) 0.25 MG/0.5ML SOAJ, Inject 0.5 mg into the skin once a week. (Patient not taking: Reported on 01/12/2022), Disp: 4 mL, Rfl: 0  No Known Allergies  ROS: See pertinent positives and negatives per HPI.  OBSERVATIONS/OBJECTIVE:  VITALS per patient if applicable: There were no vitals filed for this visit. There is no height or weight on file to calculate BMI.    GENERAL: Alert and oriented. Appears well and in no acute distress.  HEENT: Atraumatic. Conjunctiva clear. No obvious abnormalities on inspection of external nose and ears.  NECK: Normal  movements of the head and neck.  LUNGS: On inspection, no signs of respiratory distress. Breathing rate appears normal. No obvious gross SOB, gasping or wheezing, and no conversational dyspnea.  CV: No obvious cyanosis.  MS: Moves all visible extremities without noticeable abnormality.  PSYCH/NEURO: Pleasant and cooperative. No obvious depression or anxiety. Speech and thought processing grossly intact.  ASSESSMENT AND PLAN:  Problem List Items Addressed This Visit   None Visit Diagnoses     Need for malaria prophylaxis    -  Primary   Will treat with Malarone daily starting 2 days before her trip, during her trip, and for 7 days after. Info given on signs of malaria. UTD on hepatitis A.         I discussed the assessment and treatment plan with the patient. The patient was provided an opportunity to ask questions and all were answered. The patient agreed with the plan and demonstrated an understanding of the instructions.   The patient was advised to call back or seek an in-person evaluation if the symptoms worsen or if the condition fails to improve as  anticipated.   Charyl Dancer, NP

## 2022-01-14 ENCOUNTER — Ambulatory Visit (INDEPENDENT_AMBULATORY_CARE_PROVIDER_SITE_OTHER): Payer: No Typology Code available for payment source

## 2022-01-14 ENCOUNTER — Ambulatory Visit (INDEPENDENT_AMBULATORY_CARE_PROVIDER_SITE_OTHER): Payer: No Typology Code available for payment source | Admitting: Podiatry

## 2022-01-14 DIAGNOSIS — B351 Tinea unguium: Secondary | ICD-10-CM | POA: Diagnosis not present

## 2022-01-14 DIAGNOSIS — M2041 Other hammer toe(s) (acquired), right foot: Secondary | ICD-10-CM

## 2022-01-14 MED ORDER — TERBINAFINE HCL 250 MG PO TABS: 250.0000 mg | ORAL_TABLET | Freq: Every day | ORAL | 0 refills | Status: DC

## 2022-01-14 NOTE — Progress Notes (Signed)
  Subjective:  Patient ID: Kaitlyn Keith, female    DOB: 1965/11/05,  MRN: [id number]  Chief Complaint  Patient presents with   Routine Post Op    POV #3 DOS 12/04/2021 HAMMERTOE CORRECTION/REMOVAL ARTHRITIS RT 2ND TOE     56 y.o. female returns for post-op check.  She is doing well.  Getting to go out of the country.  She would like to be treated for onychomycosis that has been bothering her on the right hallux  Review of Systems: Negative except as noted in the HPI. Denies N/V/F/Ch.   Objective:  There were no vitals filed for this visit. There is no height or weight on file to calculate BMI. Constitutional Well developed. Well nourished.  Vascular Foot warm and well perfused. Capillary refill normal to all digits.  Calf is soft and supple, no posterior calf or knee pain, negative Homans' sign  Neurologic Normal speech. Oriented to person, place, and time. Epicritic sensation to light touch grossly present bilaterally.  Dermatologic Incision is well-healed nonhypertrophic  Nail dystrophy and onychomycosis right hallux  Orthopedic: Tenderness to palpation noted about the surgical site.   Multiple view plain film radiographs: Hardware intact.  Bridging across arthrodesis site.  Not 100% consolidated yet Assessment:   1. Hammertoe of right foot   2. Onychomycosis    Plan:  Patient was evaluated and treated and all questions answered.  S/p foot surgery right -Overall well.  Kirschner wire was removed uneventfully.  I discussed with her and reviewed her x-rays with her that there are still some residual bruising that needs to occur across the arthrodesis site expect this to improve with time.  She may resume regular shoe gear and activity, avoid impact activity for now.   We also discussed etiology treatment options of onychomycosis in detail.  We discussed oral topical and laser treatment.  I recommended oral treatment.  Lamisil 90-day Rx sent to pharmacy.  Discussed the  risk benefits and side effects of Lamisil.  She will follow-up as needed if it does not improve or worsens  No follow-ups on file.

## 2022-02-05 ENCOUNTER — Inpatient Hospital Stay: Payer: No Typology Code available for payment source | Attending: Hematology and Oncology

## 2022-02-09 ENCOUNTER — Telehealth: Payer: Self-pay

## 2022-02-09 ENCOUNTER — Inpatient Hospital Stay: Payer: No Typology Code available for payment source | Admitting: Hematology and Oncology

## 2022-02-09 NOTE — Assessment & Plan Note (Deleted)
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 Neoadj chemo 12/24/14- 04/29/15 AC x 4 foll by Abraxane X 12 Rt Lumpectomy: Path CR 0/2 LN Adj XRT 07/23/15- 09/08/15 PET/CT scan 11/17/2016:Subcutaneous nodules in the neck, upper back, left arm, abdomen and pelvis Patient progressed on Xeloda January 2019-07/04/2017 stopped due to progression of disease Cerebellar mass diagnosed 07/12/2016: Resection followed by stereotactic radiation 07/29/2016 Lymph node from 03/02/2018 biopsy: PDL1+ Pembrolizumab completed 01/22/2020 ------------------------------------------------------------------------------------------------------------------------------------------------ Current treatment:Pembrolizumab completed end of October 2021. Pembrolizumab toxicities: Hypothyroidism: On Synthroid 163mg daily  MRI brain 08/02/2021: Stable posttreatment changes. CT CAP 08/01/2021: Stable Weight issues: Patient wants to consider going on injection therapy likely Wegovy or Monjuro.  I instructed her to find a primary care physician and discuss that with them.  [place]  Return to clinic in 6 months after scans. We will do brain MRI in 1 year Telephone visit after the CT scan to discuss results

## 2022-02-09 NOTE — Telephone Encounter (Signed)
S/w pt as she was scheduled for telephone visit with MD this morning to discuss CT results, but CT scan was not scheduled. Message sent to our PA team to obtain PA on scan, and per pt's request, she was scheduled for CT CAP 02/13/22 at 1400. Pt knows she should be NPO 4* prior to scan and will need to consume PO contrast at 1200 and 1300. She will come [facility] to pick up contrast today.   Message sent to PA team to start PA asap.  Safety Zone Portal entered for failure to complete PA for scan ordered 08/04/21 with the expected date of 02/04/22.

## 2022-02-13 ENCOUNTER — Ambulatory Visit (HOSPITAL_COMMUNITY)
Admission: RE | Admit: 2022-02-13 | Discharge: 2022-02-13 | Disposition: A | Discharge status: Home / Self Care | Payer: No Typology Code available for payment source | Source: Ambulatory Visit | Attending: Hematology and Oncology | Admitting: Hematology and Oncology

## 2022-02-13 DIAGNOSIS — Z171 Estrogen receptor negative status [ER-]: Secondary | ICD-10-CM | POA: Insufficient documentation

## 2022-02-13 DIAGNOSIS — C50511 Malignant neoplasm of lower-outer quadrant of right female breast: Secondary | ICD-10-CM | POA: Insufficient documentation

## 2022-02-13 MED ORDER — IOHEXOL 300 MG/ML  SOLN
100.0000 mL | Freq: Once | INTRAMUSCULAR | Status: AC | PRN
  Administered 2022-02-13: 100 mL via INTRAVENOUS

## 2022-02-13 NOTE — Progress Notes (Signed)
HEMATOLOGY-ONCOLOGY TELEPHONE VISIT PROGRESS NOTE  I connected with our patient on 02/17/22 at  9:30 AM EDT by telephone and verified that I am speaking with the correct person using two identifiers.  I discussed the limitations, risks, security and privacy concerns of performing an evaluation and management service by telephone and the availability of in person appointments.  I also discussed with the patient that there may be a patient responsible charge related to this service. The patient expressed understanding and agreed to proceed.   History of Present Illness: Kaitlyn Keith is a 56 y.o. with above-mentioned history of metastatic breast cancer with brain metastasis. She presents to via telephone for follow-up to review ct cap. Dental issues.  She had some dental work done in [place] and she is having difficulties with that and is trying to get that repaired here.  She is also dealing with some allergies.  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%, HER-2 negative ratio 1.43   12/10/2014 Breast MRI   Right breast lower outer quadrant: 2.3 x 2.4 x 2.4 cm rim-enhancing mass abuts the pectoralis fascia but no enhancement of pectoralis muscle, second focus of artifact?'s second tissue marker clip, no lymph nodes   12/10/2014 Clinical Stage   Stage IIA: T2 N0   12/24/2014 - 04/29/2015 Neo-Adjuvant Chemotherapy   Dose dense Adriamycin and Cytoxan 4 followed by weekly Abraxane 12   05/02/2015 Breast MRI   complete radiologic response   06/16/2015 Surgery   Left Lumpectomy: Complete path Response, 0/2 LN   06/16/2015 Pathologic Stage   ypT0 ypN0   07/23/2015 - 09/05/2015 Radiation Therapy   Adjuvant RT: 50.4 Gy in 28 fractions and a  boost of 10 Gy in 5 fractions to total dose of 60.4 Gy   10/24/2015 Survivorship   SCP mailed to patient in lieu of in person visit.   07/26/2016 - 07/27/2016 Radiation Therapy    SRS brain   07/27/2016 - 07/29/2016 Hospital Admission   Cerebellar mass: Right suboccipital craniotomy for tumor resection with stereotactic navigation: Metastatic poorly differentiated adenocarcinoma with extensive necrosis positive for CK 7, MOC 31, CK 5/6; Neg for Er/PR, GATA-3, GCDFP CDX2, Napsin A and TTF-1   11/17/2016 PET scan   Subcutaneous nodules in the neck, upper back, left arm, abdomen and pelvis,. 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 in BARD 1, BRCA2 and NF1   12/31/2016 Miscellaneous   Everolimus 10 mg daily for cycle 1 if she cannot tolerate will decrease to 7.5 mg daily   05/24/2017 - 07/04/2017 Chemotherapy   Xeloda 2000 mg 2 weeks on 1 week off stopped due to progression of disease based on CT scans done 06/27/2017   07/25/2017 - 03/13/2018 Chemotherapy   [place] days 1 and 8 every 3 weeks stopped for progression    03/20/2018 - 03/31/2018 Radiation Therapy   Radiation to lymph nodes   04/03/2018 -  Chemotherapy   Keytruda every 3 weeks      REVIEW  OF SYSTEMS:   Constitutional: Denies fevers, chills or abnormal weight loss All other systems were reviewed with the patient and are negative. 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 Neoadj chemo 12/24/14- 04/29/15 AC x 4 foll by Abraxane X 12 Rt Lumpectomy: Path CR 0/2 LN Adj XRT 07/23/15- 09/08/15 PET/CT scan 11/17/2016:Subcutaneous nodules in the neck, upper back, left arm, abdomen and pelvis Patient progressed on Xeloda  January 2019-07/04/2017 stopped due to progression of disease Cerebellar mass diagnosed 07/12/2016: Resection followed by stereotactic radiation 07/29/2016 Lymph node from 03/02/2018 biopsy: PDL1+ Pembrolizumab completed 01/22/2020 ------------------------------------------------------------------------------------------------------------------------------------------------ Current treatment: Pembrolizumab completed end of October 2021. Pembrolizumab toxicities: Hypothyroidism: On Synthroid 115 mcg daily   MRI brain 08/02/2021: Stable posttreatment changes. CT CAP 08/01/2021: Stable Weight issues: With fatty liver.  I encouraged her to exercise regularly and eat a lower calorie diet.  CT CAP 02/14/2022: No evidence of metastatic disease in chest abdomen pelvis.  Hepatic steatosis.  She has a brain MRI scheduled for October. Return to clinic in 1 year for follow-up with another scan.  I discussed the assessment and treatment plan with the patient. The patient was provided an opportunity to ask questions and all were answered. The patient agreed with the plan and demonstrated an understanding of the instructions. The patient was advised to call back or seek an in-person evaluation if the symptoms worsen or if the condition fails to improve as anticipated.   I provided 12 minutes of non-face-to-face time during this encounter.  This includes time for charting and coordination of care   Harriette Ohara, MD  I Gardiner Coins am scribing for Dr. Lindi Adie  I have reviewed the above documentation for accuracy and completeness, and I agree with the above.

## 2022-02-17 ENCOUNTER — Inpatient Hospital Stay (HOSPITAL_BASED_OUTPATIENT_CLINIC_OR_DEPARTMENT_OTHER): Payer: No Typology Code available for payment source | Admitting: Hematology and Oncology

## 2022-02-17 DIAGNOSIS — Z171 Estrogen receptor negative status [ER-]: Secondary | ICD-10-CM | POA: Diagnosis not present

## 2022-02-17 DIAGNOSIS — C50511 Malignant neoplasm of lower-outer quadrant of right female breast: Secondary | ICD-10-CM | POA: Diagnosis not present

## 2022-02-17 DIAGNOSIS — C792 Secondary malignant neoplasm of skin: Secondary | ICD-10-CM | POA: Diagnosis not present

## 2022-02-17 NOTE — Assessment & Plan Note (Signed)
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 Neoadj chemo 12/24/14- 04/29/15 AC x 4 foll by Abraxane X 12 Rt Lumpectomy: Path CR 0/2 LN Adj XRT 07/23/15- 09/08/15 PET/CT scan 11/17/2016:Subcutaneous nodules in the neck, upper back, left arm, abdomen and pelvis Patient progressed on Xeloda January 2019-07/04/2017 stopped due to progression of disease Cerebellar mass diagnosed 07/12/2016: Resection followed by stereotactic radiation 07/29/2016 Lymph node from 03/02/2018 biopsy: PDL1+ Pembrolizumab completed 01/22/2020 ------------------------------------------------------------------------------------------------------------------------------------------------ Current treatment:Pembrolizumab completed end of October 2021. Pembrolizumab toxicities: Hypothyroidism: On Synthroid 183mg daily  MRI brain 08/02/2021: Stable posttreatment changes. CT CAP 08/01/2021: Stable Weight issues: Patient wants to consider going on injection therapy likely Wegovy or Monjuro.  I instructed her to find a primary care physician and discuss that with them.    CT CAP 02/14/2022: No evidence of metastatic disease in chest abdomen pelvis.  Hepatic steatosis.   Return to clinic in 1 year for follow-up with another scan.

## 2022-02-19 ENCOUNTER — Telehealth: Payer: Self-pay | Admitting: Hematology and Oncology

## 2022-02-19 NOTE — Telephone Encounter (Signed)
Scheduled appointment per 9/27 los. Patient is aware.

## 2022-02-25 ENCOUNTER — Other Ambulatory Visit: Payer: Self-pay | Admitting: Podiatry

## 2022-03-06 LAB — HM MAMMOGRAPHY

## 2022-03-11 ENCOUNTER — Encounter: Payer: Self-pay | Admitting: Hematology and Oncology

## 2022-05-01 ENCOUNTER — Other Ambulatory Visit: Payer: Self-pay | Admitting: Hematology and Oncology

## 2022-05-01 ENCOUNTER — Other Ambulatory Visit: Payer: Self-pay | Admitting: Podiatry

## 2022-05-01 ENCOUNTER — Other Ambulatory Visit: Payer: Self-pay | Admitting: Nurse Practitioner

## 2022-05-01 DIAGNOSIS — C50511 Malignant neoplasm of lower-outer quadrant of right female breast: Secondary | ICD-10-CM

## 2022-05-06 NOTE — Telephone Encounter (Signed)
Spoke tp patient and she isn't traveling. She had asked the pharmacy to send a request for Alprazolam (which is filled by another provider)  scheduled an appointment to come inand discuss this med and get labs for thyroid on 05/20/22.  Dm/cma

## 2022-05-20 ENCOUNTER — Encounter: Payer: Self-pay | Admitting: Nurse Practitioner

## 2022-05-20 ENCOUNTER — Ambulatory Visit: Payer: No Typology Code available for payment source | Admitting: Nurse Practitioner

## 2022-05-20 VITALS — BP 116/76 | HR 79 | Temp 96.3°F | Ht 63.0 in | Wt 169.0 lb

## 2022-05-20 DIAGNOSIS — R051 Acute cough: Secondary | ICD-10-CM | POA: Diagnosis not present

## 2022-05-20 DIAGNOSIS — I10 Essential (primary) hypertension: Secondary | ICD-10-CM | POA: Diagnosis not present

## 2022-05-20 DIAGNOSIS — G4700 Insomnia, unspecified: Secondary | ICD-10-CM

## 2022-05-20 DIAGNOSIS — E039 Hypothyroidism, unspecified: Secondary | ICD-10-CM | POA: Diagnosis not present

## 2022-05-20 LAB — BASIC METABOLIC PANEL
BUN: 13 mg/dL (ref 6–23)
CO2: 27 mEq/L (ref 19–32)
Calcium: 9.3 mg/dL (ref 8.4–10.5)
Chloride: 105 mEq/L (ref 96–112)
Creatinine, Ser: 0.67 mg/dL (ref 0.40–1.20)
GFR: 99.34 mL/min (ref >60.00)
Glucose, Bld: 91 mg/dL (ref 70–99)
Potassium: 3.6 mEq/L (ref 3.5–5.1)
Sodium: 139 mEq/L (ref 135–145)

## 2022-05-20 LAB — CBC
HCT: 41.1 % (ref 36.0–46.0)
Hemoglobin: 13.7 g/dL (ref 12.0–15.0)
MCHC: 33.2 g/dL (ref 30.0–36.0)
MCV: 89 fl (ref 78.0–100.0)
Platelets: 203 10*3/uL (ref 150.0–400.0)
RBC: 4.62 Mil/uL (ref 3.87–5.11)
RDW: 12.4 % (ref 11.5–15.5)
WBC: 3.2 10*3/uL — ABNORMAL LOW (ref 4.0–10.5)

## 2022-05-20 MED ORDER — LISINOPRIL-HYDROCHLOROTHIAZIDE 20-12.5 MG PO TABS: 1.0000 | ORAL_TABLET | Freq: Two times a day (BID) | ORAL | 1 refills | Status: DC

## 2022-05-20 MED ORDER — HYDROCOD POLI-CHLORPHE POLI ER 10-8 MG/5ML PO SUER: 5.0000 mL | Freq: Two times a day (BID) | ORAL | 0 refills | Status: DC | PRN

## 2022-05-20 MED ORDER — ALPRAZOLAM 2 MG PO TABS: 2.0000 mg | ORAL_TABLET | Freq: Every day | ORAL | 2 refills | Status: DC

## 2022-05-20 NOTE — Assessment & Plan Note (Addendum)
Chronic, stable. BP 116/76 today. Continue lisinopril-hctz 20-12.'5mg'$  twice a day. Check CMP, CBC. Follow-up in 6 months.

## 2022-05-20 NOTE — Assessment & Plan Note (Signed)
Last TSH was very elevated. Will recheck TSH and free T4 today and adjust regimen based on results.

## 2022-05-20 NOTE — Progress Notes (Signed)
Established Patient Office Visit  Subjective   Patient ID: Kaitlyn Keith, female    DOB: 1965-09-22  Age: 56 y.o. MRN: [id number]  Chief Complaint  Patient presents with   Office Visit    Med f/u, needs alprazolam  Would like thyroid checked, pt fasting  No other concerns  Requesting records for mammogram     HPI  Buffey Zabinski is here to follow-up on hypothyroidism and anxiety.   She stats that her anxiety is slightly worsening and it is causing her to have trouble sleeping at night. She was taking xanax 1-2 mg as needed at bedtime while going through her cancer treatment for several years. She does not take this medication often, however would like to restart it. She states that she tried melatonin, zquil, and trazodone in the past which gave her nightmares. Her sister took [place] and it caused her to sleep walk and she is afraid to take this medication. She has also been having a few episodes of heart racing , the most recent a month ago. She is wondering if her thyroid levels are still abnormal. She denies shortness of breath.   She is also experiencing an ongoing cough. She would like a refill of the cough medication sent in.      05/20/2022    9:29 AM 09/04/2021    2:02 PM 09/04/2021   12:59 PM 07/06/2017    7:42 AM 06/08/2017    2:54 PM  Depression screen PHQ 2/9  Decreased Interest 0 0 0 0 0  Down, Depressed, Hopeless 0 0 0 0 0  PHQ - 2 Score 0 0 0 0 0  Altered sleeping 2 3     Tired, decreased energy 2 2     Change in appetite 0 0     Feeling bad or failure about yourself  0 0     Trouble concentrating 0 2     Moving slowly or fidgety/restless 0 0     Suicidal thoughts 0 0     PHQ-9 Score 4 7     Difficult doing work/chores Somewhat difficult Not difficult at all         05/20/2022    9:29 AM 09/04/2021    2:02 PM  GAD 7 : Generalized Anxiety Score  Nervous, Anxious, on Edge 2 0  Control/stop worrying 0 0  Worry too much - different things 0 3  Trouble  relaxing 2 0  Restless 0 0  Easily annoyed or irritable 0 0  Afraid - awful might happen 0 0  Total GAD 7 Score 4 3  Anxiety Difficulty Somewhat difficult Not difficult at all      ROS See pertinent positives and negatives per HPI.    Objective:     BP 116/76 (BP Location: Left Arm, Patient Position: Sitting, Cuff Size: Normal)   Pulse 79   Temp (!) 96.3 F (35.7 C) (Temporal)   Ht '5\' 3"'$  (1.6 m)   Wt 169 lb (76.7 kg)   SpO2 96%   BMI 29.94 kg/m  BP Readings from Last 3 Encounters:  05/20/22 116/76  09/04/21 126/84  07/07/20 140/90   Wt Readings from Last 3 Encounters:  05/20/22 169 lb (76.7 kg)  09/04/21 182 lb 3.2 oz (82.6 kg)  07/07/20 181 lb 1.6 oz (82.1 kg)    Physical Exam Vitals and nursing note reviewed.  Constitutional:      General: She is not in acute distress.    Appearance: Normal appearance.  HENT:     Head: Normocephalic.  Eyes:     Conjunctiva/sclera: Conjunctivae normal.  Cardiovascular:     Rate and Rhythm: Normal rate and regular rhythm.     Pulses: Normal pulses.     Heart sounds: Normal heart sounds.  Pulmonary:     Effort: Pulmonary effort is normal.     Breath sounds: Normal breath sounds.  Musculoskeletal:     Cervical back: Normal range of motion.  Skin:    General: Skin is warm.  Neurological:     General: No focal deficit present.     Mental Status: She is alert and oriented to person, place, and time.  Psychiatric:        Mood and Affect: Mood normal.        Behavior: Behavior normal.        Thought Content: Thought content normal.        Judgment: Judgment normal.    Results for orders placed or performed in visit on 05/20/22  HM MAMMOGRAPHY  Result Value Ref Range   HM Mammogram Self Reported Normal 0-4 Bi-Rad, Self Reported Normal      The 10-year ASCVD risk score (Arnett DK, et al., 2019) is: 2.8%    Assessment & Plan:   Problem List Items Addressed This Visit       Cardiovascular and Mediastinum    Hypertension    Chronic, stable. BP 116/76 today. Continue lisinopril-hctz 20-12.'5mg'$  twice a day. Check CMP, CBC. Follow-up in 6 months.       Relevant Medications   lisinopril-hydrochlorothiazide (ZESTORETIC) 20-12.5 MG tablet   Other Relevant Orders   Basic metabolic panel   CBC     Endocrine   Hypothyroidism - Primary    Last TSH was very elevated. Will recheck TSH and free T4 today and adjust regimen based on results.       Relevant Orders   TSH   T4, free     Other   Breast cancer of lower-outer quadrant of right female breast ([place])   Relevant Medications   alprazolam (XANAX) 2 MG tablet   Insomnia    Chronic, stable. She is taking hydroxyzine '25mg'$  as needed at bedtime and xanax 1-'2mg'$  prn at bedtime. PDMP reviewed. Will refill the xanax today. Follow-up in 6 months or sooner with concerns.       Other Visit Diagnoses     Acute cough       Will refill tussionex. Can also take claritin '10mg'$  as needed daily.       Return in about 6 months (around 11/19/2022) for CPE.    Charyl Dancer, NP

## 2022-05-20 NOTE — Assessment & Plan Note (Signed)
Chronic, stable. She is taking hydroxyzine '25mg'$  as needed at bedtime and xanax 1-'2mg'$  prn at bedtime. PDMP reviewed. Will refill the xanax today. Follow-up in 6 months or sooner with concerns.

## 2022-05-20 NOTE — Patient Instructions (Signed)
It was great to see you!  We are checking your labs today and will let you know the results via mychart/phone.   [facility]  Let's follow-up in 6 months, sooner if you have concerns.  If a referral was placed today, you will be contacted for an appointment. Please note that routine referrals can sometimes take up to 3-4 weeks to process. Please call our office if you haven't heard anything after this time frame.  Take care,  Vance Peper, NP

## 2022-05-21 LAB — TSH: TSH: <0.01 u[IU]/mL — ABNORMAL LOW (ref 0.35–5.50)

## 2022-05-21 LAB — T4, FREE: Free T4: 1.52 ng/dL (ref 0.60–1.60)

## 2022-05-21 MED ORDER — LEVOTHYROXINE SODIUM 112 MCG PO TABS: 112.0000 ug | ORAL_TABLET | Freq: Every day | ORAL | 0 refills | Status: DC

## 2022-05-21 NOTE — Addendum Note (Signed)
Addended by: Vance Peper A on: 05/21/2022 08:10 AM   Modules accepted: Orders

## 2022-06-11 ENCOUNTER — Other Ambulatory Visit: Payer: Self-pay | Admitting: Radiation Therapy

## 2022-06-11 DIAGNOSIS — C7931 Secondary malignant neoplasm of brain: Secondary | ICD-10-CM

## 2022-06-18 ENCOUNTER — Other Ambulatory Visit: Payer: Self-pay | Admitting: Radiation Therapy

## 2022-06-23 ENCOUNTER — Telehealth: Payer: Self-pay | Admitting: Radiation Therapy

## 2022-06-23 NOTE — Telephone Encounter (Signed)
I spoke with Mrs. Fahrner about her upcoming brain MRI and telephone follow-up in March. She requested I also send an email with this information. This was sent to [email or web address].    [id number] R.T.(R)(T) Radiation Special Procedures Navigator

## 2022-08-07 ENCOUNTER — Ambulatory Visit
Admission: RE | Admit: 2022-08-07 | Discharge: 2022-08-07 | Disposition: A | Discharge status: Home / Self Care | Payer: No Typology Code available for payment source | Source: Ambulatory Visit | Attending: Radiation Oncology | Admitting: Radiation Oncology

## 2022-08-07 DIAGNOSIS — C7931 Secondary malignant neoplasm of brain: Secondary | ICD-10-CM

## 2022-08-07 MED ORDER — GADOPICLENOL 0.5 MMOL/ML IV SOLN
7.5000 mL | Freq: Once | INTRAVENOUS | Status: AC | PRN
  Administered 2022-08-07: 7.5 mL via INTRAVENOUS

## 2022-08-09 ENCOUNTER — Inpatient Hospital Stay: Payer: No Typology Code available for payment source | Attending: Radiation Oncology

## 2022-08-10 ENCOUNTER — Other Ambulatory Visit: Payer: Self-pay | Admitting: Nurse Practitioner

## 2022-08-10 NOTE — Telephone Encounter (Signed)
Last refill and Last OV was on 05/21/22 Per last note patient is due for a CPE 11/19/22

## 2022-08-11 ENCOUNTER — Encounter: Payer: Self-pay | Admitting: Urology

## 2022-08-11 ENCOUNTER — Ambulatory Visit
Admission: RE | Admit: 2022-08-11 | Discharge: 2022-08-11 | Disposition: A | Discharge status: Home / Self Care | Payer: No Typology Code available for payment source | Source: Ambulatory Visit | Attending: Urology | Admitting: Urology

## 2022-08-11 DIAGNOSIS — C7931 Secondary malignant neoplasm of brain: Secondary | ICD-10-CM

## 2022-08-11 NOTE — Progress Notes (Addendum)
Telephone nursing appointment for patient to review most recent scan results from 08/07/22. I verified patient's identity and began nursing interview. Patient reports doing well.   Meaningful use complete.   Patient aware of their 11:30am-08/11/22 telephone appointment w/ [name] Bruning PA-C. I left my extension [phone] in case patient needs anything. Patient verbalized understanding. This concludes the nursing interview.   Patient contact [phone]     Leandra Kern, LPN

## 2022-08-11 NOTE — Progress Notes (Signed)
Radiation Oncology         [phone]) [phone] ________________________________  Name: Kaitlyn Keith MRN: [id number]  Date: 08/11/2022  DOB: December 04, 1965  Follow-Up Visit Note  CC: Charyl Dancer, NP  [name], MD  Diagnosis:    57 y.o. 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      ICD-10-CM   1. Malignant neoplasm metastatic to brain (HCC)  C79.31       Interval Since Last Radiation: 5 years s/p palliative XRT to nodes and subcutaneous lesions; 6 years s/p pre-op SRS to solitary right cerebellar brain met  03/20/18 - 03/31/18: ([place]) 1. Pelvis / 30 Gy in 10 fractions (right inguinal nodes) 2. Left Supraclavicular / 30 Gy in 10 fractions 3. Abdomen / 20 Gy in 8 fractions (subcutaneous Met)  07/11/2017 - 07/22/2017 palliative XRT for painful subcutaneous nodules ([place]) 1. Left Flank / 30 Gy in 10 fractions 2. Left Groin / 30 Gy in 10 fractions  07/26/16 Preop SRS Treatment Tammi Klippel): PTV1 Right Cerebellum was treated to 18 Gy in 1 fraction  07/23/15-09/05/15 ([place]): 50.4 Gy to the right breast + 10 Gy boost  Narrative:  I spoke with the patient to conduct her routine scheduled annual follow up visit to review recent MRI brain via telephone to spare the patient unnecessary potential exposure in the healthcare setting during the current COVID-19 pandemic.  The patient was notified in advance and gave permission to proceed with this visit format.  Kaitlyn Keith is a pleasant 57 y.o. 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 She was diagnosed with her cancer in July 2016, and underwent neoadjuvant chemotherapy followed by right lumpectomy and adjuvant radiation. She was found to have recurrent disease in February 2018 with a solitary right cerebellar mass, and underwent preoperative SRS treatment followed by surgical  resection on 07/29/2016.   She developed multiple subcutaneous nodules in the neck, upper back, left arm, abdomen and pelvis, noted on PET scan from 11/17/16. She was started on Everolimus on 12/31/16 but was discontinued 04/11/17 due to disease progression with interval development of subcutaneous nodules in the ventral lower left pelvic wall and left flank and interval growth of 3 scattered peritoneal metastases. She started Xeloda in 05/2017 but discontinued due to disease progression on re-staging scans from 06/27/17 which showed a mixed response to therapy with some regression of the previously noted intraperitoneal implants but other lesions with significant interval growth including subcutaneous nodules in the left flank, left vulvar region, right inguinal LAN and anterior mediastinal LAN. She elected to move forward with palliative XRT to 2 painful subcutaneous lesions in the left flank and left pubic/vulvar region which was completed in March 2019.  She tolerated radiotherapy very well and had an excellent response with decreased size of both treated lesions.  Her systemic chemotherapy was switched to [facility] but this was discontinued in October 2019 due to evidence of disease progression on follow-up CT C/A/P from 02/02/2018 indicating continued progression of right inguinal lymph node and right inguinal lymphadenopathy.  She underwent a CT-guided biopsy of the right inguinal lymph node on 03/02/2018 with final pathology confirming metastatic, poorly differentiated carcinoma, ER/PR negative and HER-2 negative.  Her systemic therapy was changed to pembrolizumab immunotherapy at that time and she was also referred back to radiation oncology for consideration of palliative radiotherapy to the painful, progressive right inguinal lymphadenopathy.  At the time of her consult on 03/15/2018, she also mentioned that she had recently developed pain in the left supraclavicular region as well as in the left flank area.   She elected to proceed with palliative radiotherapy to the 3 sites of painful metastatic disease in the right inguinal, left supraclavicular and left abdomen/flank and this was completed on 03/31/2018.  She tolerated the radiation well and did have significant improvement in her pain.  She completed 28 cycles of Keytruda (pembrolizumab) and her follow up CT C/A/P from 12/14/19 showed complete resolution of metastatic focus in the right inguinal/groin region as well as complete resolution of the left supraclavicular lymphadenopathy and a stable 6 mm hypodense lesion in the liver.  No current evidence of active malignancy in the chest, abdomen or pelvis. She completed maintenance immunotherapy with Keytruda on 03/17/2020 after the 30th cycle ad restaging CT scans have continued to show no evidence of recurrent or metastatic disease in the chest, abdomen or pelvis.  She has also continued being followed in our multidisciplinary brain tumor conference, with serial MRI brain scans every 6 months which have continued to show a stable appearance of the 3 mm enhancing nodule in the right lateral cerebellum at the site of previous tumor treatment, consistent with treatment related changes especially in light of its stable appearance over multiple scans since late 2020. There were no other enhancing lesions. Today's visit is to review results from her most recent brain MRI scan performed on 08/07/22 which remains stable without any evidence of recurrent metastatic disease in the brain.  We reviewed these findings over the phone today.  On review of systems, the patient reports that she is doing well overall. She denies any chest pain, shortness of breath, cough, fevers, chills, night sweats, or unintended weight changes. She denies any bowel or bladder disturbances, and denies abdominal pain, nausea or vomiting. She denies new visual or auditory changes, dizziness, imbalance, tremors or seizure activity. She has continued  having occasional headaches which respond to tylenol or advil if needed.  The headaches became severe back in 10/2018 and she ended up having a wisdom tooth extraction with significant improvement of her HAs. She has some chronic pain in the left groin/hip joint region that radiates down her left leg anteriorly to her foot, unchanged recently.  This pain is exacerbated with activities and improves with rest and does not wake her from sleep at night. An MRI of the hip in October 2020 was without evidence of bony metastatic disease or acute abnormalities.  She reports mild improvement with taking muscle relaxants prn. She denies any new skin lesions or concerns. Unfortunately, she and he husband had some "bad dental work" done in [place] in 2023 and they have had to have multiple procedures for crown replacements since that time to correct it. They are almost finished with the dental procedures which she has tolerated well overall. It has been a lot of physical, emotional and financial stress. Overall, she is doing well and remains without any concerning neurologic symptoms. A complete review of systems is obtained and is otherwise negative.   Past Medical History:  Past Medical History:  Diagnosis Date   Anxiety    Arthritis    back   Back pain    Brain cancer ([place])    brian met from triple negative breast ca   Breast cancer [facility])    Breast cancer of lower-outer quadrant of right female breast ([place]) 12/05/2014   Cancer of right  breast metastatic to brain [facility])    Depression    FH: chemotherapy 12/2014-04/2015   Genetic testing 12/10/2016   Kaitlyn Keith underwent genetic counseling and testing for hereditary cancer syndromes on 11/18/2016. 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 Mutations in MSH6 are associated with a hereditary cancer syndrome called Lynch syndrome. For more detailed discussion, please see genetic counseling documentation from  12/10/2016.  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 Mutations in MSH6 are associated with a hereditary cancer syndrome called Lynch syndrome. For more detailed discussion, please see genetic counseling documentation from 12/10/2016.  Testing was perfo   Radiation 07/23/15-09/05/15   right breast 50.4 Gy, boost of 10 Gy    Past Surgical History: Past Surgical History:  Procedure Laterality Date   APPLICATION OF CRANIAL NAVIGATION Right 07/27/2016   Procedure: APPLICATION OF CRANIAL NAVIGATION;  Surgeon: Kevan Ny Ditty, MD;  Location: [place];  Service: Neurosurgery;  Laterality: Right;   BIOPSY OF SKIN SUBCUTANEOUS TISSUE AND/OR MUCOUS MEMBRANE Left 12/24/2016   Procedure: OPEN BIOPSY LESIONS ON LEFT LOWER BACK AND SHOULDER BLADE;  Surgeon: [name], MD;  Location: [place];  Service: General;  Laterality: Left;   BREAST LUMPECTOMY WITH NEEDLE LOCALIZATION AND AXILLARY SENTINEL LYMPH NODE BX Right 06/16/2015   Procedure: BREAST LUMPECTOMY WITH NEEDLE LOCALIZATION AND AXILLARY SENTINEL LYMPH NODE BX;  Surgeon: [name], MD;  Location: [place];  Service: General;  Laterality: Right;   BREAST REDUCTION SURGERY     CRANIOTOMY Right 07/27/2016   Procedure: Right Suboccipital craniotomy for tumor resection with stereotactic navigation;  Surgeon: Kevan Ny Ditty, MD;  Location: [place];  Service: Neurosurgery;  Laterality: Right;   PORT-A-CATH REMOVAL N/A 06/16/2015   Procedure: REMOVAL PORT-A-CATH;  Surgeon: [name], MD;  Location: [place];  Service: General;  Laterality: N/A;   PORT-A-CATH REMOVAL Left 04/21/2020    Procedure: REMOVAL PORT-A-CATH;  Surgeon: [name], MD;  Location: [place];  Service: General;  Laterality: Left;   PORTACATH PLACEMENT N/A 12/23/2014   Procedure: INSERTION PORT-A-CATH;  Surgeon: [name], MD;  Location: [place];  Service: General;  Laterality: N/A;   PORTACATH PLACEMENT Left 07/08/2017   Procedure: INSERTION PORT-A-CATH;  Surgeon: [name], MD;  Location: [place];  Service: General;  Laterality: Left;    Social History:  Social History   Socioeconomic History   Marital status: Married    Spouse name: Not on file   Number of children: 1   Years of education: Not on file   Highest education level: Not on file  Occupational History   Not on file  Tobacco Use   Smoking status: Never   Smokeless tobacco: Never  Vaping Use   Vaping Use: Never used  Substance and Sexual Activity   Alcohol use: Yes    Comment: social   Drug use: No   Sexual activity: Yes    Birth control/protection: None  Other Topics Concern   Not on file  Social History Narrative   Not on file   Social Determinants  of Health   Financial Resource Strain: Not on file  Food Insecurity: No Food Insecurity (08/11/2022)   Hunger Vital Sign    Worried About Running Out of Food in the Last Year: Never true    Ran Out of Food in the Last Year: Never true  Transportation Needs: No Transportation Needs (08/11/2022)   PRAPARE - Hydrologist (Medical): No    Lack of Transportation (Non-Medical): No  Physical Activity: Not on file  Stress: Not on file  Social Connections: Not on file  Intimate Partner Violence: Not At Risk (08/11/2022)   Humiliation, Afraid, Rape, and Kick questionnaire    Fear of Current or Ex-Partner: No    Emotionally Abused: No    Physically Abused: No    Sexually Abused: No  The patient recently re-married in 09/2020. She works for a [employer] ([employer] firm, and continues working from  home full time.  Family History: Family History  Problem Relation Age of Onset   Aneurysm Mother 39       d.55   Heart attack Father 74       d.62   Endometrial cancer Sister 12   Lung cancer Maternal Uncle        d.68s   Cancer Maternal Grandmother        unspecified type-possibly stomach d.88s                          ALLERGIES:  has No Known Allergies.  Meds: Current Outpatient Medications  Medication Sig Dispense Refill   alprazolam (XANAX) 2 MG tablet Take 1 tablet (2 mg total) by mouth at bedtime. 30 tablet 2   chlorpheniramine-HYDROcodone (TUSSIONEX) 10-8 MG/5ML Take 5 mLs by mouth every 12 (twelve) hours as needed for cough. Do not take at same time as alprazolam (xanax) 70 mL 0   hydrOXYzine (ATARAX) 25 MG tablet TAKE 1 TABLET (25 MG TOTAL) BY MOUTH AT BEDTIME AS NEEDED FOR ANXIETY. 90 tablet 2   levothyroxine (SYNTHROID) 112 MCG tablet Take 1 tablet (112 mcg total) by mouth daily. 90 tablet 0   levothyroxine (SYNTHROID) 125 MCG tablet TAKE 1 TABLET BY MOUTH EVERY DAY 90 tablet 0   lisinopril-hydrochlorothiazide (ZESTORETIC) 20-12.5 MG tablet Take 1 tablet by mouth 2 (two) times daily. 180 tablet 1   No current facility-administered medications for this encounter.    Physical Findings:   vitals were not taken for this visit.   Pain Assessment Pain Score: 0-No painUnable to assess due to telephone visit format.   Lab Findings: Lab Results  Component Value Date   WBC 3.2 (L) 05/20/2022   HGB 13.7 05/20/2022   HGB 14.6 07/31/2021   HGB 11.7 04/04/2017   HCT 41.1 05/20/2022   HCT 35.9 04/04/2017   PLT 203.0 05/20/2022   PLT 192 07/31/2021   PLT 207 04/04/2017    Lab Results  Component Value Date   NA 139 05/20/2022   NA 139 04/04/2017   K 3.6 05/20/2022   K 3.7 04/04/2017   CHLORIDE 108 04/04/2017   CO2 27 05/20/2022   CO2 24 04/04/2017   GLUCOSE 91 05/20/2022   GLUCOSE 76 04/04/2017   BUN 13 05/20/2022   BUN 5.8 (L) 04/04/2017   CREATININE 0.67  05/20/2022   CREATININE 0.73 07/31/2021   CREATININE 0.8 04/04/2017   BILITOT 0.8 07/31/2021   BILITOT 0.43 04/04/2017   ALKPHOS 85 07/31/2021   ALKPHOS 131 04/04/2017  AST 18 07/31/2021   AST 18 04/04/2017   ALT 12 07/31/2021   ALT 12 04/04/2017   PROT 7.7 07/31/2021   PROT 7.0 04/04/2017   ALBUMIN 4.4 07/31/2021   ALBUMIN 3.0 (L) 04/04/2017   CALCIUM 9.3 05/20/2022   CALCIUM 8.6 04/04/2017   ANIONGAP 7 07/31/2021    Radiographic Findings: MR Brain W Wo Contrast  Result Date: 08/09/2022 CLINICAL DATA:  Brain metastases, assess treatment response. EXAM: MRI HEAD WITHOUT AND WITH CONTRAST TECHNIQUE: Multiplanar, multiecho pulse sequences of the brain and surrounding structures were obtained without and with intravenous contrast. CONTRAST:  7.5 mL Vueway. COMPARISON:  MRI brain 08/01/2021 and older. FINDINGS: BRAIN New Lesions: None. Larger lesions: None. Stable or Smaller lesions: Stable postoperative changes from right retrosigmoid craniotomy and resection of the lesion in the right cerebellar hemisphere. Unchanged enhancing scar in the resection cavity. Other Brain findings: No acute infarct or intracranial hemorrhage. No hydrocephalus or extra-axial collection. Vascular: Normal flow voids and enhancement. Skull and upper cervical spine: Prior right retrosigmoid craniotomy. Unchanged chronic fracture of the C2 vertebral body. Sinuses/Orbits: Unremarkable. Other: None. IMPRESSION: Stable exam.  No evidence of new metastatic disease in the brain. Electronically Signed   By: Emmit Alexanders M.D.   On: 08/09/2022 12:16     Impression/Plan: 1. 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  Her most recent MRI brain from 08/07/22 was recently reviewed with the multidisciplinary tumor board on Monday 08/09/22 and remains stable without any evidence of recurrent metastatic disease in the brain.   Therefore, we will continue with surveillance MRI brain scan annually and a follow up visit thereafter to review results and recommendations from tumor board. Her most recent systemic imaging from 02/13/22 continues to show no evidence of recurrent or metastatic disease in the chest, abdomen or pelvis.  She met with her medical oncologist, Dr. Lindi Adie in follow-up on 02/17/22 and the recommendation is to continue in observation since she remains stable, having completed her maintenance immunotherapy with Keytruda in October 2021, and to continue with repeat CT scans for systemic disease restaging annually.  She knows to call us with any questions or concerns in the interim and is comfortable and in agreement with this plan.  I personally spent 30 minutes in this encounter including chart review, reviewing radiological studies, meeting face-to-face with the patient, entering orders and completing documentation.     Nicholos Johns, PA-C

## 2022-08-18 ENCOUNTER — Other Ambulatory Visit: Payer: Self-pay | Admitting: Radiation Therapy

## 2022-08-18 DIAGNOSIS — C7931 Secondary malignant neoplasm of brain: Secondary | ICD-10-CM

## 2022-08-19 ENCOUNTER — Other Ambulatory Visit: Payer: Self-pay | Admitting: Nurse Practitioner

## 2022-08-19 ENCOUNTER — Telehealth: Payer: Self-pay | Admitting: Nurse Practitioner

## 2022-08-19 DIAGNOSIS — G4700 Insomnia, unspecified: Secondary | ICD-10-CM

## 2022-08-19 MED ORDER — ALPRAZOLAM 2 MG PO TABS: 2.0000 mg | ORAL_TABLET | Freq: Every day | ORAL | 2 refills | Status: DC

## 2022-08-19 MED ORDER — LEVOTHYROXINE SODIUM 112 MCG PO TABS: 112.0000 ug | ORAL_TABLET | Freq: Every day | ORAL | 0 refills | Status: DC

## 2022-08-19 NOTE — Telephone Encounter (Signed)
I spoke with patient after reviewing her chart for Rx refill of her thyroid medication and per last note for labs in 12/23 she was not aware of last not nor the need to follow up for lab work. I asked patient if I could schedule her for a lab visit and she said that she has a lot going on and will call the office back to schedule a lab only visit.  Patient would also like to a Rx refill of her Xanax as well.

## 2022-08-19 NOTE — Telephone Encounter (Signed)
Kaitlyn Keith called to refill levothyroxine and xanax. She was unaware of lab result and needing repeat. Refill sent to pharmacy, but needs lab visit for TSH check. PDMP reviewed.

## 2022-08-24 NOTE — Telephone Encounter (Signed)
Called Pt back. She [facility]'t [facility] and she will call back to schedule a lab visit.

## 2022-08-25 MED ORDER — LEVOTHYROXINE SODIUM 112 MCG PO TABS: 112.0000 ug | ORAL_TABLET | Freq: Every day | ORAL | 0 refills | Status: DC

## 2022-08-25 MED ORDER — ALPRAZOLAM 2 MG PO TABS: 2.0000 mg | ORAL_TABLET | Freq: Every day | ORAL | 2 refills | Status: DC

## 2022-08-25 NOTE — Telephone Encounter (Signed)
Pt is needing both of these scripts, alprazolam (XANAX) 2 MG tablet [id number] and levothyroxine (SYNTHROID) 112 MCG tablet [id number] [facility]  [facility] Address: [address] [place], [place] [address] Phone: [phone],  [id number] [place] [place] [address] [place], [place] [address] Phone: [phone]  Fax: [phone] DEA #: [id number]

## 2022-08-25 NOTE — Addendum Note (Signed)
Addended by: Vance Peper A on: 08/25/2022 02:56 PM   Modules accepted: Orders

## 2022-08-25 NOTE — Telephone Encounter (Signed)
Patient request for Rx to be sent to [facility] in [place]. She will call back in the morning to make an appointment for lab work.

## 2022-08-25 NOTE — Telephone Encounter (Signed)
Patient notified that refills sent to [facility].

## 2022-10-12 ENCOUNTER — Encounter: Payer: Self-pay | Admitting: Genetic Counselor

## 2022-10-12 NOTE — Progress Notes (Signed)
UPDATE: BRCA2 c.6125A>C (p.[id number]) has been amended to Likely Benign. Amended report date is April 29, 2022.

## 2022-10-27 ENCOUNTER — Ambulatory Visit: Payer: No Typology Code available for payment source | Admitting: Nurse Practitioner

## 2022-10-27 ENCOUNTER — Encounter: Payer: Self-pay | Admitting: Nurse Practitioner

## 2022-10-27 VITALS — BP 102/70 | HR 67 | Temp 97.7°F | Ht 63.0 in | Wt 171.8 lb

## 2022-10-27 DIAGNOSIS — M542 Cervicalgia: Secondary | ICD-10-CM

## 2022-10-27 DIAGNOSIS — I10 Essential (primary) hypertension: Secondary | ICD-10-CM | POA: Diagnosis not present

## 2022-10-27 DIAGNOSIS — R21 Rash and other nonspecific skin eruption: Secondary | ICD-10-CM | POA: Diagnosis not present

## 2022-10-27 DIAGNOSIS — E039 Hypothyroidism, unspecified: Secondary | ICD-10-CM | POA: Diagnosis not present

## 2022-10-27 DIAGNOSIS — R051 Acute cough: Secondary | ICD-10-CM

## 2022-10-27 DIAGNOSIS — G4700 Insomnia, unspecified: Secondary | ICD-10-CM

## 2022-10-27 MED ORDER — LISINOPRIL-HYDROCHLOROTHIAZIDE 20-12.5 MG PO TABS: 1.0000 | ORAL_TABLET | Freq: Every day | ORAL | 1 refills | Status: DC

## 2022-10-27 MED ORDER — HYDROCOD POLI-CHLORPHE POLI ER 10-8 MG/5ML PO SUER: 5.0000 mL | Freq: Two times a day (BID) | ORAL | 0 refills | Status: DC | PRN

## 2022-10-27 MED ORDER — TRIAMCINOLONE ACETONIDE 0.1 % EX CREA: 1.0000 | TOPICAL_CREAM | Freq: Two times a day (BID) | CUTANEOUS | 0 refills | Status: DC

## 2022-10-27 MED ORDER — HYDROXYZINE HCL 25 MG PO TABS: 25.0000 mg | ORAL_TABLET | Freq: Every evening | ORAL | 2 refills | Status: DC | PRN

## 2022-10-27 NOTE — Patient Instructions (Signed)
It was great to see you!  Start triamcinolone cream twice a day to the spot on your stomach.   We are checking your labs today and will let you know the results via mychart/phone.   I have refilled your meds  Let's follow-up in 6 months, sooner if you have concerns.  If a referral was placed today, you will be contacted for an appointment. Please note that routine referrals can sometimes take up to 3-4 weeks to process. Please call our office if you haven't heard anything after this time frame.  Take care,  Rodman Pickle, NP

## 2022-10-27 NOTE — Assessment & Plan Note (Addendum)
Chronic, ongoing.  Continue Xanax 2 mg daily at bedtime as needed for sleep.  Discussed stress and anxiety.  She has been having increase in stressors recently with her husband losing his job.  She declines daily medication and therapy at this point in time.  Follow-up if symptoms worsen or with any concerns.

## 2022-10-27 NOTE — Progress Notes (Signed)
Acute Office Visit  Subjective:     Patient ID: Kaitlyn Keith, female    DOB: 05-May-1966, 57 y.o.   MRN: [id number]  Chief Complaint  Patient presents with   Tick Removal    10  days ago, area is itchy and sore-left side, wants thryoid levels checked    HPI Patient is in today for tick bite. She is concerned that part of the tick is still in her abdomen.   She has also been having some increase in stress and anxiety recently.  She states that her husband's company is closing and he will be out of his job in 2 months.  She has been having some trouble sleeping at night.  She still has been taking the Xanax 2 mg as needed at bedtime.  She states that she has taken Zoloft in the past, however she is not interested in taking a medication every day.  She is still taking the lisinopril-hctz for her blood pressure.  She is not having any side effects to this medication.  She needs a refill of this today.  She has also been having a cough that has worsened with working out in the yard.  She has been taking allergy medication as needed.  She is asking for a refill on Tussionex cough syrup.  TICK BITE  Duration: 10 days ago Location: left abdomen Itching: yes Fever: no Chills: no headache: no Muscle pain: yes - neck pain, shoulder and joint pain Rash: yes back, left arm  ROS See pertinent positives and negatives per HPI.      Objective:    BP 102/70 (BP Location: Left Arm)   Pulse 67   Temp 97.7 F (36.5 C) (Oral)   Ht 5\' 3"  (1.6 m)   Wt 171 lb 12.8 oz (77.9 kg)   SpO2 97%   BMI 30.43 kg/m    Physical Exam Vitals and nursing note reviewed.  Constitutional:      General: She is not in acute distress.    Appearance: Normal appearance.  HENT:     Head: Normocephalic.  Eyes:     Conjunctiva/sclera: Conjunctivae normal.  Cardiovascular:     Rate and Rhythm: Normal rate and regular rhythm.     Pulses: Normal pulses.     Heart sounds: Normal heart sounds.   Pulmonary:     Effort: Pulmonary effort is normal.     Breath sounds: Normal breath sounds.  Musculoskeletal:     Cervical back: Normal range of motion.  Skin:    General: Skin is warm.     Comments: Red circular area to left forearm. Small raised area to left abdomen, no signs of tick remnant  Neurological:     General: No focal deficit present.     Mental Status: She is alert and oriented to person, place, and time.  Psychiatric:        Mood and Affect: Mood normal.        Behavior: Behavior normal.        Thought Content: Thought content normal.        Judgment: Judgment normal.      Assessment & Plan:   Problem List Items Addressed This Visit       Cardiovascular and Mediastinum   Hypertension    Chronic, stable.  BP today 102/70.  Continue lisinopril-hydrochlorothiazide 20-12.5mg  daily.  Refill sent to the pharmacy.      Relevant Medications   lisinopril-hydrochlorothiazide (ZESTORETIC) 20-12.5 MG tablet  Endocrine   Hypothyroidism    Check TSH today and adjust regimen based on results.        Musculoskeletal and Integument   Rash - Primary    She had a tick bite 10 days ago.  She is having a localized skin reaction to the tick bite, no appearance of tick remnant in her skin.  Will send in triamcinolone cream to use twice a day as needed to this area.  She has a red, circular area to her left forearm.  This does not appear to be related to the tick bite.  This could be ecchymosis.  She also has a fading rash to her back which is hard to appreciate at this time.  Will check Lyme and RMSF titers with recent tick bite.       Relevant Orders   B. burgdorfi antibodies   Rocky mtn spotted fvr abs pnl(IgG+IgM)     Other   Insomnia    Chronic, ongoing.  Continue Xanax 2 mg daily at bedtime as needed for sleep.  Discussed stress and anxiety.  She has been having increase in stressors recently with her husband losing his job.  She declines daily medication and therapy  at this point in time.  Follow-up if symptoms worsen or with any concerns.      Other Visit Diagnoses     Neck pain       Will check lyme and RMSF titers with tick bite 10 days ago and current symptoms. Can take ibuprofen or tylenol as needed for pain.   Acute cough       Related to allergies. continue zyrtec or claritin daily. Will send in tussionex q12 hr prn cough. PDMP reviewed. Do not take together with xanax.       Meds ordered this encounter  Medications   lisinopril-hydrochlorothiazide (ZESTORETIC) 20-12.5 MG tablet    Sig: Take 1 tablet by mouth daily.    Dispense:  90 tablet    Refill:  1   triamcinolone cream (KENALOG) 0.1 %    Sig: Apply 1 Application topically 2 (two) times daily.    Dispense:  30 g    Refill:  0   hydrOXYzine (ATARAX) 25 MG tablet    Sig: Take 1 tablet (25 mg total) by mouth at bedtime as needed for anxiety.    Dispense:  90 tablet    Refill:  2   chlorpheniramine-HYDROcodone (TUSSIONEX) 10-8 MG/5ML    Sig: Take 5 mLs by mouth every 12 (twelve) hours as needed for cough. Do not take at same time as alprazolam (xanax)    Dispense:  70 mL    Refill:  0    Return in about 6 months (around 04/28/2023) for hypothyroid, anxiety.  Gerre Scull, NP

## 2022-10-27 NOTE — Assessment & Plan Note (Signed)
Chronic, stable.  BP today 102/70.  Continue lisinopril-hydrochlorothiazide 20-12.5mg  daily.  Refill sent to the pharmacy.

## 2022-10-27 NOTE — Assessment & Plan Note (Signed)
Check TSH today and adjust regimen based on results.

## 2022-10-27 NOTE — Assessment & Plan Note (Signed)
She had a tick bite 10 days ago.  She is having a localized skin reaction to the tick bite, no appearance of tick remnant in her skin.  Will send in triamcinolone cream to use twice a day as needed to this area.  She has a red, circular area to her left forearm.  This does not appear to be related to the tick bite.  This could be ecchymosis.  She also has a fading rash to her back which is hard to appreciate at this time.  Will check Lyme and RMSF titers with recent tick bite.

## 2022-10-28 LAB — TSH: TSH: 0.72 u[IU]/mL (ref 0.35–5.50)

## 2022-10-31 LAB — ROCKY MTN SPOTTED FVR ABS PNL(IGG+IGM)
RMSF IgG: NOT DETECTED
RMSF IgM: NOT DETECTED

## 2022-10-31 LAB — B. BURGDORFI ANTIBODIES: B burgdorferi Ab IgG+IgM: <0.9 index

## 2022-11-26 ENCOUNTER — Other Ambulatory Visit: Payer: Self-pay | Admitting: Nurse Practitioner

## 2022-12-11 ENCOUNTER — Other Ambulatory Visit: Payer: Self-pay | Admitting: Nurse Practitioner

## 2022-12-15 ENCOUNTER — Other Ambulatory Visit: Payer: Self-pay | Admitting: Radiation Therapy

## 2022-12-16 NOTE — Telephone Encounter (Signed)
I called and spoke with patient and she is going to [place] for 17 days.

## 2022-12-16 NOTE — Telephone Encounter (Signed)
Patient notified that Rx sent to pharmacy. 

## 2022-12-30 ENCOUNTER — Encounter: Payer: Self-pay | Admitting: Hematology and Oncology

## 2023-02-17 ENCOUNTER — Inpatient Hospital Stay: Payer: No Typology Code available for payment source | Admitting: Hematology and Oncology

## 2023-02-18 ENCOUNTER — Ambulatory Visit (HOSPITAL_COMMUNITY)
Admission: RE | Admit: 2023-02-18 | Discharge: 2023-02-18 | Disposition: A | Discharge status: Home / Self Care | Payer: No Typology Code available for payment source | Source: Ambulatory Visit | Attending: Hematology and Oncology

## 2023-02-18 DIAGNOSIS — C50511 Malignant neoplasm of lower-outer quadrant of right female breast: Secondary | ICD-10-CM | POA: Diagnosis present

## 2023-02-18 DIAGNOSIS — Z171 Estrogen receptor negative status [ER-]: Secondary | ICD-10-CM | POA: Diagnosis present

## 2023-02-18 DIAGNOSIS — C792 Secondary malignant neoplasm of skin: Secondary | ICD-10-CM | POA: Diagnosis present

## 2023-02-18 MED ORDER — IOHEXOL 300 MG/ML  SOLN
100.0000 mL | Freq: Once | INTRAMUSCULAR | Status: AC | PRN
  Administered 2023-02-18: 100 mL via INTRAVENOUS

## 2023-02-23 ENCOUNTER — Inpatient Hospital Stay
Payer: No Typology Code available for payment source | Attending: Hematology and Oncology | Admitting: Hematology and Oncology

## 2023-02-23 DIAGNOSIS — C50511 Malignant neoplasm of lower-outer quadrant of right female breast: Secondary | ICD-10-CM | POA: Diagnosis not present

## 2023-02-23 DIAGNOSIS — C792 Secondary malignant neoplasm of skin: Secondary | ICD-10-CM

## 2023-02-23 DIAGNOSIS — Z171 Estrogen receptor negative status [ER-]: Secondary | ICD-10-CM

## 2023-02-23 NOTE — Progress Notes (Signed)
Patient Care Team: Kaitlyn Scull, NP as PCP - General (Internal Medicine) Kaitlyn Miner, MD as Consulting Physician (General Surgery) Kaitlyn Croissant, MD as Consulting Physician (Hematology and Oncology) Kaitlyn Blackbird, MD as Consulting Physician (Radiation Oncology) Kaitlyn Proud, RN as Registered Nurse Kaitlyn Angelica, RN as Registered Nurse Kaitlyn Hartshorn, NP (Inactive) as Nurse Practitioner (Nurse Practitioner) Patient, [name] (General Practice) Mammography, [facility] (Diagnostic Radiology)  DIAGNOSIS:  Encounter Diagnoses  Name Primary?   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, estrogen receptor negative (HCC)     SUMMARY OF ONCOLOGIC HISTORY: Oncology History  Breast cancer of lower-outer quadrant of right female breast (HCC)  12/03/2014 Mammogram   Right breast mass 1.9 cm it o'clock position 8 cm depth from the nipple   12/03/2014 Initial Diagnosis   Right breast biopsy 8:00: Invasive ductal carcinoma, grade 3, ER 0%, PR 0%, Ki-67 90%, HER-2 negative ratio 1.43   12/10/2014 Breast MRI   Right breast lower outer quadrant: 2.3 x 2.4 x 2.4 cm rim-enhancing mass abuts the pectoralis fascia but no enhancement of pectoralis muscle, second focus of artifact?'s second tissue marker clip, no lymph nodes   12/10/2014 Clinical Stage   Stage IIA: T2 N0   12/24/2014 - 04/29/2015 Neo-Adjuvant Chemotherapy   Dose dense Adriamycin and Cytoxan 4 followed by weekly Abraxane 12   05/02/2015 Breast MRI   complete radiologic response   06/16/2015 Surgery   Left Lumpectomy: Complete path Response, 0/2 LN   06/16/2015 Pathologic Stage   ypT0 ypN0   07/23/2015 - 09/05/2015 Radiation Therapy   Adjuvant RT: 50.4 Gy in 28 fractions and a boost of 10 Gy in 5 fractions to total dose of 60.4 Gy   10/24/2015 Survivorship   SCP mailed to patient in lieu of in person visit.   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 Mutations in MSH6 are associated with a hereditary cancer syndrome called Lynch syndrome. For more detailed discussion, please see genetic counseling documentation from 12/10/2016.  Testing was performed through [facility]'s 46-gene Common Hereditary Cancers Panel. Genes analyzed include: APC, ATM, AXIN2, BARD1, BMPR1A, BRCA1, BRCA2, BRIP1, CDH1, CDKN2A, CHEK2, CTNNA1, DICER1, EPCAM, GREM1, HOXB13, KIT, MEN1, MLH1, MSH2, MSH3, MSH6, MUTYH, NBN, NF1, NTHL1, PALB2, PDGFRA, PMS2, POLD1, POLE, PTEN, RAD50, RAD51C, RAD51D, SDHA, SDHB, SDHC, SDHD, SMAD4, SMARCA4, STK11, TP53, TSC1, TSC2, and VHL. The result report is dated for 12/09/2016. No other pathogenic mutations were identified. The following 3 variants of uncertain significance were noted. VUSs should not be used  for clinical management. BARD1 c.469G>A (Val157Ile) BRCA2 c.6125A>C (p.Gln2042Pro) NF1 c.4354G>A (p.Aso1452Asn)  UPDATE: BRCA2  c.6125A>C (p.Gln2042Pro) has been amended to Likely Benign. Amended report date is April 29, 2022.   12/31/2016 Miscellaneous   Everolimus 10 mg daily for cycle 1 if she cannot tolerate will decrease to 7.5 mg daily   05/24/2017 - 07/04/2017 Chemotherapy   Xeloda 2000 mg 2 weeks on 1 week off stopped due to progression of disease based on CT scans done 06/27/2017   07/25/2017 - 03/13/2018 Chemotherapy   [place]aven days 1 and 8 every 3 weeks stopped for progression    03/20/2018 - 03/31/2018 Radiation Therapy   Radiation to lymph nodes   04/03/2018 -  Chemotherapy   Keytruda every 3 weeks      CHIEF COMPLIANT: Surveillance of breast cancer Kaitlyn Keith is a 57 year old with above-mentioned history of metastatic breast cancer with brain metastases who had received 2 years of immunotherapy with Kaitlyn Keith that was completed in 2021.  She underwent a CT scan recently and is here today to discuss the results of the scan by telephone visit.  She reports no new health issues or concerns.  ALLERGIES:  has No Known Allergies.  MEDICATIONS:  Current Outpatient Medications  Medication Sig Dispense Refill   alprazolam (XANAX) 2 MG tablet Take 1 tablet (2 mg total) by mouth at bedtime. 30 tablet 2   atovaquone-proguanil (MALARONE) 250-100 MG TABS tablet TAKE 1 TAB DAILY START 2 DAYS BEFORE YOUR TRIP, ALL DURING YOUR TRIP, AND 7 DAYS AFTER YOU GET HOME 30 tablet 1   chlorpheniramine-HYDROcodone (TUSSIONEX) 10-8 MG/5ML Take 5 mLs by mouth every 12 (twelve) hours as needed for cough. Do not take at same time as alprazolam (xanax) 70 mL 0   hydrOXYzine (ATARAX) 25 MG tablet Take 1 tablet (25 mg total) by mouth at bedtime as needed for anxiety. 90 tablet 2   levothyroxine (SYNTHROID) 112 MCG tablet TAKE 1 TABLET BY MOUTH EVERY DAY 90 tablet 0   lisinopril-hydrochlorothiazide (ZESTORETIC) 20-12.5 MG tablet Take 1 tablet by mouth daily. 90 tablet 1   triamcinolone cream (KENALOG) 0.1 % Apply 1 Application topically  2 (two) times daily. 30 g 0   No current facility-administered medications for this visit.    PHYSICAL EXAMINATION: ECOG PERFORMANCE STATUS: 1 - Symptomatic but completely ambulatory    LABORATORY DATA:  I have reviewed the data as listed    Latest Ref Rng & Units 05/20/2022    9:25 AM 07/31/2021   12:23 PM 01/30/2021    4:20 PM  CMP  Glucose 70 - 99 mg/dL 91  91  95   BUN 6 - 23 mg/dL 13  10  12    Creatinine 0.40 - 1.20 mg/dL 1.61  0.96  0.45   Sodium 135 - 145 mEq/L 139  139  142   Potassium 3.5 - 5.1 mEq/L 3.6  3.5  3.6   Chloride 96 - 112 mEq/L 105  101  107   CO2 19 - 32 mEq/L 27  31  25    Calcium 8.4 - 10.5 mg/dL 9.3  9.9  9.3   Total Protein 6.5 - 8.1 g/dL  7.7  7.2   Total Bilirubin 0.3 - 1.2 mg/dL  0.8  0.7   Alkaline Phos 38 - 126 U/L  85  89   AST 15 - 41 U/L  18  15   ALT 0 - 44 U/L  12  8     Lab Results  Component Value Date  WBC 3.2 (L) 05/20/2022   HGB 13.7 05/20/2022   HCT 41.1 05/20/2022   MCV 89.0 05/20/2022   PLT 203.0 05/20/2022   NEUTROABS 1.8 07/31/2021    ASSESSMENT & PLAN:  Breast cancer of lower-outer quadrant of right female breast (HCC) Right breast biopsy 12/03/2014 8:00: Invasive ductal carcinoma, grade 3, ER 0%, PR 0%, 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 Neoadj chemo 12/24/14- 04/29/15 AC x 4 foll by Abraxane X 12 Rt Lumpectomy: Path CR 0/2 LN Adj XRT 07/23/15- 09/08/15 PET/CT scan 11/17/2016:Subcutaneous nodules in the neck, upper back, left arm, abdomen and pelvis Patient progressed on Xeloda  January 2019-07/04/2017 stopped due to progression of disease Cerebellar mass diagnosed 07/12/2016: Resection followed by stereotactic radiation 07/29/2016 Lymph node from 03/02/2018 biopsy: PDL1+ Pembrolizumab completed  01/22/2020 ------------------------------------------------------------------------------------------------------------------------------------------------ Current treatment: Pembrolizumab completed end of October 2021. Pembrolizumab toxicities: Hypothyroidism: On Synthroid 115 mcg daily   MRI brain 08/02/2021: Stable posttreatment changes. CT CAP 08/01/2021: Stable   CT CAP 02/23/2023: No evidence of metastatic disease in chest abdomen pelvis.  Hepatic steatosis.   Return to clinic in 1 year for follow-up with another scan.     Orders Placed This Encounter  Procedures   CT CHEST ABDOMEN PELVIS W CONTRAST    Standing Status:   Future    Standing Expiration Date:   02/23/2024    Order Specific Question:   If indicated for the ordered procedure, I authorize the administration of contrast media per Radiology protocol    Answer:   Yes    Order Specific Question:   Does the patient have a contrast media/X-ray dye allergy?    Answer:   No    Order Specific Question:   Is patient pregnant?    Answer:   No    Order Specific Question:   Preferred imaging location?    Answer:   [facility]    Order Specific Question:   Release to patient    Answer:   Immediate    Order Specific Question:   If indicated for the ordered procedure, I authorize the administration of oral contrast media per Radiology protocol    Answer:   Yes   The patient has a good understanding of the overall plan. she agrees with it. she will call with any problems that may develop before the next visit here. Total time spent: 30 mins including face to face time and time spent for planning, charting and co-ordination of care   Tamsen Meek, MD 02/23/23

## 2023-02-23 NOTE — Assessment & Plan Note (Signed)
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 Neoadj chemo 12/24/14- 04/29/15 AC x 4 foll by Abraxane X 12 Rt Lumpectomy: Path CR 0/2 LN Adj XRT 07/23/15- 09/08/15 PET/CT scan 11/17/2016:Subcutaneous nodules in the neck, upper back, left arm, abdomen and pelvis Patient progressed on Xeloda  January 2019-07/04/2017 stopped due to progression of disease Cerebellar mass diagnosed 07/12/2016: Resection followed by stereotactic radiation 07/29/2016 Lymph node from 03/02/2018 biopsy: PDL1+ Pembrolizumab completed 01/22/2020 ------------------------------------------------------------------------------------------------------------------------------------------------ Current treatment: Pembrolizumab completed end of October 2021. Pembrolizumab toxicities: Hypothyroidism: On Synthroid 115 mcg daily   MRI brain 08/02/2021: Stable posttreatment changes. CT CAP 08/01/2021: Stable   CT CAP 02/23/2023: No evidence of metastatic disease in chest abdomen pelvis.  Hepatic steatosis.   Return to clinic in 1 year for follow-up with another scan.

## 2023-02-24 ENCOUNTER — Other Ambulatory Visit: Payer: Self-pay | Admitting: Nurse Practitioner

## 2023-02-25 NOTE — Telephone Encounter (Signed)
Requesting: LEVOTHYROXINE 112 MCG TABLET  Last Visit: 10/27/2022 Next Visit: Visit date not found Last Refill: 11/26/2022  Please Advise   I see per last lab note patient was to return for a lab visit for a recheck but did not.

## 2023-03-07 ENCOUNTER — Other Ambulatory Visit: Payer: Self-pay | Admitting: Nurse Practitioner

## 2023-03-07 DIAGNOSIS — G4700 Insomnia, unspecified: Secondary | ICD-10-CM

## 2023-04-11 ENCOUNTER — Ambulatory Visit: Payer: No Typology Code available for payment source | Admitting: Nurse Practitioner

## 2023-04-11 ENCOUNTER — Encounter: Payer: Self-pay | Admitting: Nurse Practitioner

## 2023-04-11 VITALS — BP 116/80 | HR 74 | Temp 97.2°F | Ht 63.0 in | Wt 178.4 lb

## 2023-04-11 DIAGNOSIS — R0982 Postnasal drip: Secondary | ICD-10-CM | POA: Insufficient documentation

## 2023-04-11 DIAGNOSIS — K529 Noninfective gastroenteritis and colitis, unspecified: Secondary | ICD-10-CM | POA: Insufficient documentation

## 2023-04-11 DIAGNOSIS — E559 Vitamin D deficiency, unspecified: Secondary | ICD-10-CM | POA: Diagnosis not present

## 2023-04-11 DIAGNOSIS — E7800 Pure hypercholesterolemia, unspecified: Secondary | ICD-10-CM | POA: Insufficient documentation

## 2023-04-11 DIAGNOSIS — E039 Hypothyroidism, unspecified: Secondary | ICD-10-CM | POA: Diagnosis not present

## 2023-04-11 DIAGNOSIS — I10 Essential (primary) hypertension: Secondary | ICD-10-CM

## 2023-04-11 MED ORDER — HYDROCOD POLI-CHLORPHE POLI ER 10-8 MG/5ML PO SUER: 5.0000 mL | Freq: Two times a day (BID) | ORAL | 0 refills | Status: DC | PRN

## 2023-04-11 MED ORDER — ONDANSETRON HCL 4 MG PO TABS: 4.0000 mg | ORAL_TABLET | Freq: Three times a day (TID) | ORAL | 0 refills | Status: DC | PRN

## 2023-04-11 MED ORDER — LISINOPRIL-HYDROCHLOROTHIAZIDE 20-12.5 MG PO TABS: 1.0000 | ORAL_TABLET | Freq: Every day | ORAL | 1 refills | Status: DC

## 2023-04-11 MED ORDER — HYDROXYZINE HCL 25 MG PO TABS: 25.0000 mg | ORAL_TABLET | Freq: Every evening | ORAL | 2 refills | Status: AC | PRN

## 2023-04-11 MED ORDER — LEVOTHYROXINE SODIUM 112 MCG PO TABS: 112.0000 ug | ORAL_TABLET | Freq: Every day | ORAL | 0 refills | Status: DC

## 2023-04-11 NOTE — Assessment & Plan Note (Signed)
Check TSH today and adjust regimen based on results.

## 2023-04-11 NOTE — Progress Notes (Signed)
Established Patient Office Visit  Subjective   Patient ID: Kaitlyn Keith, female    DOB: 1966/03/09  Age: 57 y.o. MRN: [id number]  Chief Complaint  Patient presents with   Fatigue and Nausea    For 1 week and request thyroid check, headaches and coughing, Rx refills    HPI  Discussed the use of AI scribe software for clinical note transcription with the patient, who gave verbal consent to proceed.  History of Present Illness   The patient, with a history of thyroid disease and hypertension, presents with a week-long history of stomach discomfort and nausea. She describes the discomfort as a 'sickness in my stomach' and 'a lot of nausea.' She also reports initial diarrhea, which has since resolved. The patient has been managing her symptoms with over-the-counter remedies such as Alka-Seltzer, baking soda, and water, which have provided some relief. However, she still feels 'queasy' and not '100 percent.'  In addition to her gastrointestinal symptoms, the patient reports fatigue. She also mentions a cough and 'strangling' sensation at night, which she attributes to seasonal allergies. She has not been taking any allergy medication due to concerns about exacerbating her nausea.  The patient is planning to travel to [place] in the near future and is concerned about her current symptoms affecting her trip. She also mentions needing refills for her current medications, including hydroxyzine, lisinopril, and thyroid medication.        ROS See pertinent positives and negatives per HPI.    Objective:     BP 116/80 (BP Location: Left Arm)   Pulse 74   Temp (!) 97.2 F (36.2 C)   Ht 5\' 3"  (1.6 m)   Wt 178 lb 6.4 oz (80.9 kg)   SpO2 97%   BMI 31.60 kg/m    Physical Exam Vitals and nursing note reviewed.  Constitutional:      General: She is not in acute distress.    Appearance: Normal appearance.  HENT:     Head: Normocephalic.  Eyes:     Conjunctiva/sclera: Conjunctivae  normal.  Cardiovascular:     Rate and Rhythm: Normal rate and regular rhythm.     Pulses: Normal pulses.     Heart sounds: Normal heart sounds.  Pulmonary:     Effort: Pulmonary effort is normal.     Breath sounds: Normal breath sounds.  Abdominal:     General: There is no distension.     Palpations: Abdomen is soft.     Tenderness: There is no abdominal tenderness. There is no guarding.     Hernia: No hernia is present.  Musculoskeletal:     Cervical back: Normal range of motion.  Skin:    General: Skin is warm.  Neurological:     General: No focal deficit present.     Mental Status: She is alert and oriented to person, place, and time.  Psychiatric:        Mood and Affect: Mood normal.        Behavior: Behavior normal.        Thought Content: Thought content normal.        Judgment: Judgment normal.    The 10-year ASCVD risk score (Arnett DK, et al., 2019) is: 2.8%    Assessment & Plan:   Problem List Items Addressed This Visit       Cardiovascular and Mediastinum   Hypertension    Chronic, stable. Continue lisinopril-hydrochlorothiazide 20-12.5mg  daily.  Check CMP, CBC today. Refill sent to the pharmacy.  Relevant Medications   lisinopril-hydrochlorothiazide (ZESTORETIC) 20-12.5 MG tablet   Other Relevant Orders   CBC with Differential/Platelet   Comprehensive metabolic panel     Digestive   Gastroenteritis - Primary    She is experiencing nausea, diarrhea, and fatigue, which are showing signs of improvement but have not fully resolved. The symptoms suggest a viral etiology. We will prescribe Zofran 4mg  every 8 hours as needed for the nausea to be taken as needed, advise a bland diet, and emphasize the importance of adequate fluid intake. She is instructed to notify the clinic if symptoms do not continue to improve.        Endocrine   Hypothyroidism    Check TSH today and adjust regimen based on results.      Relevant Medications   levothyroxine  (SYNTHROID) 112 MCG tablet   Other Relevant Orders   TSH     Other   Vitamin D deficiency    History of vitamin D deficiency, will check vitamin D levels today and treat based on results.       Relevant Orders   VITAMIN D 25 Hydroxy (Vit-D Deficiency, Fractures)   Pure hypercholesterolemia    Chronic, stable. Check CMP, CBC, lipid panel today and treat based on results.       Relevant Medications   lisinopril-hydrochlorothiazide (ZESTORETIC) 20-12.5 MG tablet   Other Relevant Orders   CBC with Differential/Platelet   Comprehensive metabolic panel   Lipid panel   Post-nasal drip    She reports coughing and "strangling" at night, symptoms that are likely attributable to postnasal drip. We will consider starting her on over-the-counter Claritin or Zyrtec and refill tussionex to manage the symptoms. PDMP reviewed.        Return in about 6 months (around 10/09/2023) for CPE.    Gerre Scull, NP

## 2023-04-11 NOTE — Assessment & Plan Note (Signed)
History of vitamin D deficiency, will check vitamin D levels today and treat based on results.  ?

## 2023-04-11 NOTE — Assessment & Plan Note (Signed)
Chronic, stable. Continue lisinopril-hydrochlorothiazide 20-12.5mg  daily.  Check CMP, CBC today. Refill sent to the pharmacy.

## 2023-04-11 NOTE — Assessment & Plan Note (Signed)
She reports coughing and "strangling" at night, symptoms that are likely attributable to postnasal drip. We will consider starting her on over-the-counter Claritin or Zyrtec and refill tussionex to manage the symptoms. PDMP reviewed.

## 2023-04-11 NOTE — Patient Instructions (Signed)
It was great to see you!  Start zofran every 4 hours as needed for nausea   Keep eating bland foods and drinking fluids.  I have refilled your medications.   We are checking your labs today and will let you know the results via mychart/phone.   Let's follow-up in 6 months, sooner if you have concerns.  If a referral was placed today, you will be contacted for an appointment. Please note that routine referrals can sometimes take up to 3-4 weeks to process. Please call our office if you haven't heard anything after this time frame.  Take care,  Rodman Pickle, NP

## 2023-04-11 NOTE — Assessment & Plan Note (Signed)
Chronic, stable.  Check CMP, CBC, lipid panel today and treat based on results.

## 2023-04-11 NOTE — Assessment & Plan Note (Signed)
She is experiencing nausea, diarrhea, and fatigue, which are showing signs of improvement but have not fully resolved. The symptoms suggest a viral etiology. We will prescribe Zofran 4mg  every 8 hours as needed for the nausea to be taken as needed, advise a bland diet, and emphasize the importance of adequate fluid intake. She is instructed to notify the clinic if symptoms do not continue to improve.

## 2023-04-12 LAB — CBC WITH DIFFERENTIAL/PLATELET
Basophils Absolute: 0 10*3/uL (ref 0.0–0.1)
Basophils Relative: 0.8 % (ref 0.0–3.0)
Eosinophils Absolute: 0 10*3/uL (ref 0.0–0.7)
Eosinophils Relative: 1.2 % (ref 0.0–5.0)
HCT: 41.1 % (ref 36.0–46.0)
Hemoglobin: 13.7 g/dL (ref 12.0–15.0)
Lymphocytes Relative: 39.3 % (ref 12.0–46.0)
Lymphs Abs: 1.5 10*3/uL (ref 0.7–4.0)
MCHC: 33.2 g/dL (ref 30.0–36.0)
MCV: 90.9 fL (ref 78.0–100.0)
Monocytes Absolute: 0.3 10*3/uL (ref 0.1–1.0)
Monocytes Relative: 8.4 % (ref 3.0–12.0)
Neutro Abs: 1.9 10*3/uL (ref 1.4–7.7)
Neutrophils Relative %: 50.3 % (ref 43.0–77.0)
Platelets: 193 10*3/uL (ref 150.0–400.0)
RBC: 4.52 Mil/uL (ref 3.87–5.11)
RDW: 12.9 % (ref 11.5–15.5)
WBC: 3.8 10*3/uL — ABNORMAL LOW (ref 4.0–10.5)

## 2023-04-12 LAB — COMPREHENSIVE METABOLIC PANEL
ALT: 9 U/L (ref 0–35)
AST: 15 U/L (ref 0–37)
Albumin: 4.1 g/dL (ref 3.5–5.2)
Alkaline Phosphatase: 77 U/L (ref 39–117)
BUN: 12 mg/dL (ref 6–23)
CO2: 28 meq/L (ref 19–32)
Calcium: 9.2 mg/dL (ref 8.4–10.5)
Chloride: 103 meq/L (ref 96–112)
Creatinine, Ser: 0.73 mg/dL (ref 0.40–1.20)
GFR: 92.88 mL/min (ref >60.00)
Glucose, Bld: 119 mg/dL — ABNORMAL HIGH (ref 70–99)
Potassium: 3.9 meq/L (ref 3.5–5.1)
Sodium: 139 meq/L (ref 135–145)
Total Bilirubin: 0.6 mg/dL (ref 0.2–1.2)
Total Protein: 6.9 g/dL (ref 6.0–8.3)

## 2023-04-12 LAB — LIPID PANEL
Cholesterol: 214 mg/dL — ABNORMAL HIGH (ref 0–200)
HDL: 58 mg/dL (ref >39.00)
LDL Cholesterol: 134 mg/dL — ABNORMAL HIGH (ref 0–99)
NonHDL: 155.86
Total CHOL/HDL Ratio: 4
Triglycerides: 110 mg/dL (ref 0.0–149.0)
VLDL: 22 mg/dL (ref 0.0–40.0)

## 2023-04-12 LAB — TSH: TSH: 0.28 u[IU]/mL — ABNORMAL LOW (ref 0.35–5.50)

## 2023-04-12 LAB — VITAMIN D 25 HYDROXY (VIT D DEFICIENCY, FRACTURES): VITD: 35.72 ng/mL (ref 30.00–100.00)

## 2023-04-13 MED ORDER — LEVOTHYROXINE SODIUM 100 MCG PO TABS: 100.0000 ug | ORAL_TABLET | Freq: Every day | ORAL | 0 refills | Status: DC

## 2023-04-13 NOTE — Addendum Note (Signed)
Addended by: Rodman Pickle A on: 04/13/2023 04:41 PM   Modules accepted: Orders

## 2023-04-18 ENCOUNTER — Telehealth: Payer: Self-pay | Admitting: Nurse Practitioner

## 2023-04-18 NOTE — Telephone Encounter (Signed)
Prescription Request  04/18/2023  LOV: 04/11/2023  What is the name of the medication or equipment? ibuprofen (ADVIL) 800 MG tablet [[id number]]   Have you contacted your pharmacy to request a refill? No   Which pharmacy would you like this sent to?  [id number] [place], [place] - [address] [place] [address] Phone: [phone] Fax: [phone]    Patient notified that their request is being sent to the clinical staff for review and that they should receive a response within 2 business days.   Please advise at Mobile [phone] (mobile)

## 2023-04-19 MED ORDER — IBUPROFEN 800 MG PO TABS: 800.0000 mg | ORAL_TABLET | Freq: Three times a day (TID) | ORAL | 1 refills | Status: DC | PRN

## 2023-04-19 NOTE — Telephone Encounter (Signed)
Requesting: ibuprofen (ADVIL) 800 MG tablet  Last Visit: 04/11/2023 Next Visit: Visit date not found Last Refill: 02/25/2023 by Historical Provider  Please Advise

## 2023-04-19 NOTE — Telephone Encounter (Signed)
Refill sent to pharmacy.   

## 2023-05-23 ENCOUNTER — Encounter: Payer: Self-pay | Admitting: Podiatry

## 2023-05-23 ENCOUNTER — Ambulatory Visit: Payer: No Typology Code available for payment source | Admitting: Podiatry

## 2023-05-23 ENCOUNTER — Ambulatory Visit (INDEPENDENT_AMBULATORY_CARE_PROVIDER_SITE_OTHER): Payer: No Typology Code available for payment source

## 2023-05-23 VITALS — Ht 63.0 in | Wt 178.4 lb

## 2023-05-23 DIAGNOSIS — M79671 Pain in right foot: Secondary | ICD-10-CM | POA: Diagnosis not present

## 2023-05-23 DIAGNOSIS — M19071 Primary osteoarthritis, right ankle and foot: Secondary | ICD-10-CM | POA: Diagnosis not present

## 2023-05-23 DIAGNOSIS — M2041 Other hammer toe(s) (acquired), right foot: Secondary | ICD-10-CM

## 2023-05-23 MED ORDER — MELOXICAM 7.5 MG PO TABS: 7.5000 mg | ORAL_TABLET | Freq: Every day | ORAL | 2 refills | Status: DC

## 2023-05-23 MED ORDER — METHYLPREDNISOLONE 4 MG PO TBPK: ORAL_TABLET | ORAL | 0 refills | Status: DC

## 2023-05-23 NOTE — Progress Notes (Signed)
Chief Complaint  Patient presents with   Foot Pain    Pt is here due to right foot pain states she has been in pain for 3-4 months no injury to foot, has had previous bunion surgery on the same foot.   HPI: 57 y.o. female presents today with the above concern.  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 underwent genetic counseling and testing for hereditary cancer syndromes on 11/18/2016. 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 Mutations in MSH6 are associated with a hereditary cancer syndrome called Lynch syndrome. For more detailed discussion, please see genetic counseling documentation from 12/10/2016.  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 underwent genetic counseling and testing for hereditary cancer syndromes on 11/18/2016. 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 Mutations in MSH6 are associated with a hereditary cancer syndrome called Lynch syndrome. For more detailed discussion, please see genetic counseling documentation from 12/10/2016.  Testing was perfo   Radiation 07/23/15-09/05/15   right breast 50.4 Gy, boost of 10 Gy    Past Surgical History:  Procedure Laterality Date   APPLICATION OF CRANIAL NAVIGATION Right 07/27/2016   Procedure: APPLICATION OF CRANIAL NAVIGATION;  Surgeon: Loura Halt Ditty, MD;  Location: [facility] OR;  Service: Neurosurgery;   Laterality: Right;   BIOPSY OF SKIN SUBCUTANEOUS TISSUE AND/OR MUCOUS MEMBRANE Left 12/24/2016   Procedure: OPEN BIOPSY LESIONS ON LEFT LOWER BACK AND SHOULDER BLADE;  Surgeon: Griselda Miner, MD;  Location: [facility] OR;  Service: General;  Laterality: Left;   BREAST LUMPECTOMY WITH NEEDLE LOCALIZATION AND AXILLARY SENTINEL LYMPH NODE BX Right 06/16/2015   Procedure: BREAST LUMPECTOMY WITH NEEDLE LOCALIZATION AND AXILLARY SENTINEL LYMPH NODE BX;  Surgeon: Chevis Pretty III, MD;  Location: [facility];  Service: General;  Laterality: Right;   BREAST REDUCTION SURGERY     CRANIOTOMY Right 07/27/2016   Procedure: Right Suboccipital craniotomy for tumor resection with stereotactic navigation;  Surgeon: Loura Halt Ditty, MD;  Location: [facility] OR;  Service: Neurosurgery;  Laterality: Right;   PORT-A-CATH REMOVAL N/A 06/16/2015   Procedure: REMOVAL PORT-A-CATH;  Surgeon: Chevis Pretty III, MD;  Location: [facility];  Service: General;  Laterality: N/A;   PORT-A-CATH REMOVAL Left 04/21/2020   Procedure: REMOVAL PORT-A-CATH;  Surgeon: Griselda Miner, MD;  Location: [facility];  Service: General;  Laterality: Left;   PORTACATH PLACEMENT N/A 12/23/2014   Procedure: INSERTION PORT-A-CATH;  Surgeon: Chevis Pretty III, MD;  Location: [facility];  Service: General;  Laterality: N/A;   PORTACATH PLACEMENT Left 07/08/2017   Procedure: INSERTION PORT-A-CATH;  Surgeon: Griselda Miner, MD;  Location: [facility];  Service: General;  Laterality: Left;    No Known Allergies  Review of Systems  Musculoskeletal:  Positive for joint  pain.      Physical Exam: General: The patient is alert and oriented x3 in no acute distress.  Dermatology: Skin is warm, dry and supple bilateral lower extremities. Interspaces are clear of maceration and debris.    Vascular: Palpable pedal pulses bilaterally. Capillary refill within normal limits.  No appreciable edema.  No erythema or  calor.  Neurological: Light touch sensation grossly intact bilateral feet.   Musculoskeletal Exam: No pain on palpation of the metatarsals or toes today, right foot.  No pain with range of motion of the MPJs.  Range of motion is within normal limits of the MPJs.  No pain on palpation to the dorsal tarsometatarsal midtarsal joints.  Ankle dorsiflexion is slightly less than 10 degrees with the knee extended.  Manual muscle testing 5/5.  The right bunion correction appears in good position.  With the patient's forefoot plantarflexed in resting/sitting position, the extensor hallucis longus tendon does seem prominent, but this flattens once the forefoot is loaded and the patient is weightbearing.  This is not abnormal of the patient's concerns were put at ease regarding this finding.  Radiographic Exam (right foot, 3 weightbearing views, 05/23/2023):  Normal osseous mineralization.  Tiny inferior calcaneal spur present.  No fracture seen.  There is small amount of dorsal spurring along the tarsometatarsal and midtarsal joints seen on lateral view.  There is joint space narrowing at the second metatarsal-intermediate cuneiform joint.  Decreased calcaneal inclination angle and increased talar declination angle seen on lateral view  Assessment/Plan of Care: 1. Primary osteoarthritis of right foot   2. Pain in right foot      Meds ordered this encounter  Medications   methylPREDNISolone (MEDROL DOSEPAK) 4 MG TBPK tablet    Sig: Take as directed    Dispense:  21 tablet    Refill:  0   meloxicam (MOBIC) 7.5 MG tablet    Sig: Take 1 tablet (7.5 mg total) by mouth daily.    Dispense:  30 tablet    Refill:  2   Discussed clinical findings and treatment options with patient today.  The patient requested a corticosteroid injection, but she was reminded that there was no pain on palpation to the entire right foot today.  A cortisone injection would not be beneficial for the patient since there is no  "sweet spot" or "trigger point" to target with a cortisone injection.  She would be better served with oral medication at this time.  I will send in a Medrol Dosepak to take this week followed by meloxicam 7.5 mg to take 1 tablet p.o. daily beginning once she completes the Medrol Dosepak.  I have strongly recommended custom lumbar orthotics for the patient but she did not want to proceed with this today.  Follow-up as needed   Clerance Lav, DPM, FACFAS [facility]     [address] [place], [place] [address]                Office [phone]  Fax [phone]

## 2023-05-24 ENCOUNTER — Other Ambulatory Visit: Payer: Self-pay | Admitting: Nurse Practitioner

## 2023-05-24 NOTE — Telephone Encounter (Signed)
 Requesting: LEVOTHYROXINE 100 MCG TABLET  Last Visit: 04/11/2023 Next Visit: Visit date not found Last Refill: 04/13/2023  Please Advise

## 2023-08-05 ENCOUNTER — Other Ambulatory Visit: Payer: Self-pay | Admitting: Radiation Oncology

## 2023-08-05 DIAGNOSIS — C7931 Secondary malignant neoplasm of brain: Secondary | ICD-10-CM

## 2023-08-06 ENCOUNTER — Other Ambulatory Visit: Payer: No Typology Code available for payment source

## 2023-08-11 ENCOUNTER — Ambulatory Visit: Payer: Self-pay | Admitting: Urology

## 2023-08-18 ENCOUNTER — Other Ambulatory Visit: Payer: Self-pay | Admitting: Nurse Practitioner

## 2023-08-18 NOTE — Telephone Encounter (Signed)
 Requesting: LEVOTHYROXINE 112 MCG TABLET  Last Visit: 04/11/2023 Next Visit: Visit date not found Last Refill: The original prescription was discontinued on 04/13/2023 by Gerre Scull, NP. Renewing this prescription may not be appropriate.   Please Advise

## 2023-08-20 ENCOUNTER — Other Ambulatory Visit: Payer: Self-pay | Admitting: Podiatry

## 2023-08-20 DIAGNOSIS — M19071 Primary osteoarthritis, right ankle and foot: Secondary | ICD-10-CM

## 2023-09-17 ENCOUNTER — Ambulatory Visit
Admission: RE | Admit: 2023-09-17 | Discharge: 2023-09-17 | Disposition: A | Discharge status: Home / Self Care | Source: Ambulatory Visit | Attending: Radiation Oncology | Admitting: Radiation Oncology

## 2023-09-17 DIAGNOSIS — C7931 Secondary malignant neoplasm of brain: Secondary | ICD-10-CM

## 2023-09-17 MED ORDER — GADOPICLENOL 0.5 MMOL/ML IV SOLN
7.0000 mL | Freq: Once | INTRAVENOUS | Status: AC | PRN
  Administered 2023-09-17: 7 mL via INTRAVENOUS

## 2023-09-19 ENCOUNTER — Encounter

## 2023-09-20 ENCOUNTER — Encounter: Payer: Self-pay | Admitting: Urology

## 2023-09-20 NOTE — Progress Notes (Addendum)
 Telephone nursing appointment for review of most recent MRI Brain results. I verified patient's identity x2 and began nursing interview.    Patient states issues as follows below...  Pain: Denies  Fatigue: Moderate Weakness or loss of control of the extremities: Denies Headache: Denies Seizure: Denies Vision: Denies Memory: Denies Confusion: Denies Skin: Denies Cardiac: Denies Lungs: Denies Dexamethasone : Denies any steroids   Patient denies any related issues at this time.   Meaningful use complete.   Patient aware of their telephone appointment w/ [name] Bruning PA-C. I left my extension [phone] in case patient needs anything. Patient verbalized understanding. This concludes the nursing interview.   Patient preferred phone # [phone]    Kaitlyn Bodo, LPN

## 2023-09-21 ENCOUNTER — Ambulatory Visit
Admission: RE | Admit: 2023-09-21 | Discharge: 2023-09-21 | Disposition: A | Discharge status: Home / Self Care | Source: Ambulatory Visit | Attending: Urology | Admitting: Urology

## 2023-09-21 VITALS — Ht 63.0 in | Wt 187.0 lb

## 2023-09-21 DIAGNOSIS — C7931 Secondary malignant neoplasm of brain: Secondary | ICD-10-CM

## 2023-09-21 NOTE — Progress Notes (Signed)
 Radiation Oncology         [phone] ________________________________  Name: Kaitlyn Keith MRN: [id number]  Date: 09/21/2023  DOB: 23-Mar-1966  Follow-Up Visit Note  CC: Odette Benjamin, NP  Cameron Cea, MD  Diagnosis:    58 y.o. 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      ICD-10-CM   1. Malignant neoplasm metastatic to brain (HCC)  C79.31       Interval Since Last Radiation: 6 years s/p palliative XRT to nodes and subcutaneous lesions; 7 years s/p pre-op SRS to solitary right cerebellar brain met  03/20/18 - 03/31/18: ([facility]) 1. Pelvis / 30 Gy in 10 fractions (right inguinal nodes) 2. Left Supraclavicular / 30 Gy in 10 fractions 3. Abdomen / 20 Gy in 8 fractions (subcutaneous Met)  07/11/2017 - 07/22/2017 palliative XRT for painful subcutaneous nodules ([facility]) 1. Left Flank / 30 Gy in 10 fractions 2. Left Groin / 30 Gy in 10 fractions  07/26/16 Preop SRS Treatment [facility]): PTV1 Right Cerebellum was treated to 18 Gy in 1 fraction  07/23/15-09/05/15 ([facility]): 50.4 Gy to the right breast + 10 Gy boost  Narrative:  I spoke with the patient to conduct her routine scheduled annual follow up visit to review recent MRI brain via telephone to spare the patient unnecessary potential exposure in the healthcare setting during the current COVID-19 pandemic.  The patient was notified in advance and gave permission to proceed with this visit format.  Kaitlyn Keith is a pleasant 58 y.o. 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 She was diagnosed with her cancer in July 2016, and underwent neoadjuvant chemotherapy followed by right lumpectomy and adjuvant radiation. She was found to have recurrent disease in February 2018 with a solitary right cerebellar mass, and underwent preoperative SRS treatment followed by surgical  resection on 07/29/2016.   She developed multiple subcutaneous nodules in the neck, upper back, left arm, abdomen and pelvis, noted on PET scan from 11/17/16. She was started on Everolimus  on 12/31/16 but was discontinued 04/11/17 due to disease progression with interval development of subcutaneous nodules in the ventral lower left pelvic wall and left flank and interval growth of 3 scattered peritoneal metastases. She started Xeloda  in 05/2017 but discontinued due to disease progression on re-staging scans from 06/27/17 which showed a mixed response to therapy with some regression of the previously noted intraperitoneal implants but other lesions with significant interval growth including subcutaneous nodules in the left flank, left vulvar region, right inguinal LAN and anterior mediastinal LAN. She elected to move forward with palliative XRT to 2 painful subcutaneous lesions in the left flank and left pubic/vulvar region which was completed in March 2019.  She tolerated radiotherapy very well and had an excellent response with decreased size of both treated lesions.  Her systemic chemotherapy was switched to [place]  but this was discontinued in October 2019 due to evidence of disease progression on follow-up CT C/A/P from 02/02/2018 indicating continued progression of right inguinal lymph node and right inguinal lymphadenopathy.  She underwent a CT-guided biopsy of the right inguinal lymph node on 03/02/2018 with final pathology confirming metastatic, poorly differentiated carcinoma, ER/PR negative and HER-2 negative.  Her systemic therapy was changed to pembrolizumab  immunotherapy at that time and she was also referred back to radiation oncology for consideration of palliative radiotherapy to the painful, progressive right inguinal lymphadenopathy.  At the time of her consult on 03/15/2018, she also mentioned that she had recently developed pain in the left supraclavicular region as well as in the left flank area.   She elected to proceed with palliative radiotherapy to the 3 sites of painful metastatic disease in the right inguinal, left supraclavicular and left abdomen/flank and this was completed on 03/31/2018.  She tolerated the radiation well and did have significant improvement in her pain.  She completed 28 cycles of Keytruda  (pembrolizumab ) and her follow up CT C/A/P from 12/14/19 showed complete resolution of metastatic focus in the right inguinal/groin region as well as complete resolution of the left supraclavicular lymphadenopathy and a stable 6 mm hypodense lesion in the liver.  No current evidence of active malignancy in the chest, abdomen or pelvis. She completed maintenance immunotherapy with Keytruda  on 03/17/2020 after the 30th cycle ad restaging CT scans have continued to show no evidence of recurrent or metastatic disease in the chest, abdomen or pelvis.  She has also continued being followed in our multidisciplinary brain tumor conference, with serial MRI brain scans every 6 months which have continued to show a stable appearance of the 3 mm enhancing nodule in the right lateral cerebellum at the site of previous tumor treatment, consistent with treatment related changes especially in light of its stable appearance over multiple scans since late 2020. There were no other enhancing lesions. Today's visit is to review results from her most recent brain MRI scan performed on 09/17/23 which remains stable without any evidence of recurrent metastatic disease in the brain.  We reviewed these findings over the phone today.  On review of systems, the patient reports that she is doing well overall. She denies any chest pain, shortness of breath, cough, fevers, chills, night sweats, or unintended weight changes. She denies any bowel or bladder disturbances, and denies abdominal pain, nausea or vomiting. She denies new visual or auditory changes, dizziness, imbalance, tremors or seizure activity. She has continued  having occasional headaches which respond to tylenol  or advil  if needed.  The headaches became severe back in 10/2018 and she ended up having a wisdom tooth extraction with significant improvement of her HAs. She has some chronic pain in the left groin/hip joint region that radiates down her left leg anteriorly to her foot, unchanged recently.  This pain is exacerbated with activities and improves with rest and does not wake her from sleep at night. An MRI of the hip in October 2020 was without evidence of bony metastatic disease or acute abnormalities.  She reports mild improvement with taking muscle relaxants prn. She denies any new skin lesions or concerns. Unfortunately, she and he husband had some "bad dental work" done in [place] in 2023 that required multiple procedures for crown replacements to correct it and created a lot of physical, emotional and financial stress. She continues to deal with high stress and intermittent depression but is planning to talk with her PCP abour management options. Overall, she is doing well and remains without any concerning neurologic symptoms. A complete review of systems is obtained and is otherwise negative.   Past Medical History:  Past Medical History:  Diagnosis Date   Anxiety    Arthritis    back   Back pain    Brain cancer (HCC)    brian met from triple negative breast ca   Breast cancer [facility])    Breast cancer of lower-outer quadrant of right female breast (HCC) 12/05/2014   Cancer of right breast  metastatic to brain [facility])    Depression    FH: chemotherapy 12/2014-04/2015   Genetic testing 12/10/2016   Kaitlyn Keith underwent genetic counseling and testing for hereditary cancer syndromes on 11/18/2016. 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 Mutations in MSH6 are associated with a hereditary cancer syndrome called Lynch syndrome. For more detailed discussion, please see genetic counseling  documentation from 12/10/2016.  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 Mutations in MSH6 are associated with a hereditary cancer syndrome called Lynch syndrome. For more detailed discussion, please see genetic counseling documentation from 12/10/2016.  Testing was perfo   Radiation 07/23/15-09/05/15   right breast 50.4 Gy, boost of 10 Gy    Past Surgical History: Past Surgical History:  Procedure Laterality Date   APPLICATION OF CRANIAL NAVIGATION Right 07/27/2016   Procedure: APPLICATION OF CRANIAL NAVIGATION;  Surgeon: Raelene Bullocks Ditty, MD;  Location: [facility] OR;  Service: Neurosurgery;  Laterality: Right;   BIOPSY OF SKIN SUBCUTANEOUS TISSUE AND/OR MUCOUS MEMBRANE Left 12/24/2016   Procedure: OPEN BIOPSY LESIONS ON LEFT LOWER BACK AND SHOULDER BLADE;  Surgeon: Caralyn Chandler, MD;  Location: [facility] OR;  Service: General;  Laterality: Left;   BREAST LUMPECTOMY WITH NEEDLE LOCALIZATION AND AXILLARY SENTINEL LYMPH NODE BX Right 06/16/2015   Procedure: BREAST LUMPECTOMY WITH NEEDLE LOCALIZATION AND AXILLARY SENTINEL LYMPH NODE BX;  Surgeon: Lillette Reid III, MD;  Location: [facility];  Service: General;  Laterality: Right;   BREAST REDUCTION SURGERY     CRANIOTOMY Right 07/27/2016   Procedure: Right Suboccipital craniotomy for tumor resection with stereotactic navigation;  Surgeon: Raelene Bullocks Ditty, MD;  Location: [facility] OR;  Service: Neurosurgery;  Laterality: Right;   PORT-A-CATH REMOVAL N/A 06/16/2015   Procedure: REMOVAL PORT-A-CATH;  Surgeon: Lillette Reid III, MD;  Location: [facility];  Service: General;  Laterality: N/A;   PORT-A-CATH REMOVAL Left  04/21/2020   Procedure: REMOVAL PORT-A-CATH;  Surgeon: Caralyn Chandler, MD;  Location: [facility];  Service: General;  Laterality: Left;   PORTACATH PLACEMENT N/A 12/23/2014   Procedure: INSERTION PORT-A-CATH;  Surgeon: Lillette Reid III, MD;  Location: [facility];  Service: General;  Laterality: N/A;   PORTACATH PLACEMENT Left 07/08/2017   Procedure: INSERTION PORT-A-CATH;  Surgeon: Caralyn Chandler, MD;  Location: [facility];  Service: General;  Laterality: Left;    Social History:  Social History   Socioeconomic History   Marital status: Married    Spouse name: Not on file   Number of children: 1   Years of education: Not on file   Highest education level: Not on file  Occupational History   Not on file  Tobacco Use   Smoking status: Never   Smokeless tobacco: Never  Vaping Use   Vaping status: Never Used  Substance and Sexual Activity   Alcohol use: Yes    Comment: social   Drug use: No   Sexual activity: Yes    Birth control/protection: None  Other Topics Concern   Not on file  Social History Narrative   Not on file   Social Drivers of  Health   Financial Resource Strain: Not on file  Food Insecurity: No Food Insecurity (09/20/2023)   Hunger Vital Sign    Worried About Running Out of Food in the Last Year: Never true    Ran Out of Food in the Last Year: Never true  Transportation Needs: No Transportation Needs (09/20/2023)   PRAPARE - Administrator, Civil Service (Medical): No    Lack of Transportation (Non-Medical): No  Physical Activity: Not on file  Stress: Not on file  Social Connections: Not on file  Intimate Partner Violence: Not At Risk (09/20/2023)   Humiliation, Afraid, Rape, and Kick questionnaire    Fear of Current or Ex-Partner: No    Emotionally Abused: No    Physically Abused: No    Sexually Abused: No  The patient recently re-married in 09/2020. She works for a [employer] firm, and continues  working from home full time.  Family History: Family History  Problem Relation Age of Onset   Aneurysm Mother 21       d.55   Heart attack Father 71       d.62   Endometrial cancer Sister 14   Lung cancer Maternal Uncle        d.68s   Cancer Maternal Grandmother        unspecified type-possibly stomach d.88s                          ALLERGIES:  has no known allergies.  Meds: Current Outpatient Medications  Medication Sig Dispense Refill   alprazolam  (XANAX ) 2 MG tablet TAKE 1 TABLET BY MOUTH AT BEDTIME. 30 tablet 0   atovaquone -proguanil (MALARONE ) 250-100 MG TABS tablet TAKE 1 TAB DAILY START 2 DAYS BEFORE YOUR TRIP, ALL DURING YOUR TRIP, AND 7 DAYS AFTER YOU GET HOME 30 tablet 1   chlorpheniramine-HYDROcodone  (TUSSIONEX) 10-8 MG/5ML Take 5 mLs by mouth every 12 (twelve) hours as needed for cough. Do not take at same time as alprazolam  (xanax ) 70 mL 0   hydrOXYzine  (ATARAX ) 25 MG tablet Take 1 tablet (25 mg total) by mouth at bedtime as needed for anxiety. 90 tablet 2   ibuprofen  (ADVIL ) 800 MG tablet Take 1 tablet (800 mg total) by mouth every 8 (eight) hours as needed. 30 tablet 1   levothyroxine  (SYNTHROID ) 100 MCG tablet TAKE 1 TABLET BY MOUTH EVERY DAY 90 tablet 0   lisinopril -hydrochlorothiazide  (ZESTORETIC ) 20-12.5 MG tablet Take 1 tablet by mouth daily. 90 tablet 1   meloxicam  (MOBIC ) 7.5 MG tablet TAKE 1 TABLET BY MOUTH EVERY DAY 30 tablet 2   methylPREDNISolone  (MEDROL  DOSEPAK) 4 MG TBPK tablet Take as directed 21 tablet 0   ondansetron  (ZOFRAN ) 4 MG tablet Take 1 tablet (4 mg total) by mouth every 8 (eight) hours as needed. 30 tablet 0   triamcinolone  cream (KENALOG ) 0.1 % Apply 1 Application topically 2 (two) times daily. 30 g 0   No current facility-administered medications for this encounter.    Physical Findings:   height is 5\' 3"  (1.6 m) and weight is 187 lb (84.8 kg).   Pain Assessment Pain Score: 0-No painUnable to assess due to telephone visit  format.   Lab Findings: Lab Results  Component Value Date   WBC 3.8 (L) 04/11/2023   HGB 13.7 04/11/2023   HGB 14.6 07/31/2021   HGB 11.7 04/04/2017   HCT 41.1 04/11/2023   HCT 35.9 04/04/2017   PLT 193.0 04/11/2023  PLT 192 07/31/2021   PLT 207 04/04/2017    Lab Results  Component Value Date   NA 139 04/11/2023   NA 139 04/04/2017   K 3.9 04/11/2023   K 3.7 04/04/2017   CHLORIDE 108 04/04/2017   CO2 28 04/11/2023   CO2 24 04/04/2017   GLUCOSE 119 (H) 04/11/2023   GLUCOSE 76 04/04/2017   BUN 12 04/11/2023   BUN 5.8 (L) 04/04/2017   CREATININE 0.73 04/11/2023   CREATININE 0.73 07/31/2021   CREATININE 0.8 04/04/2017   BILITOT 0.6 04/11/2023   BILITOT 0.8 07/31/2021   BILITOT 0.43 04/04/2017   ALKPHOS 77 04/11/2023   ALKPHOS 131 04/04/2017   AST 15 04/11/2023   AST 18 07/31/2021   AST 18 04/04/2017   ALT 9 04/11/2023   ALT 12 07/31/2021   ALT 12 04/04/2017   PROT 6.9 04/11/2023   PROT 7.0 04/04/2017   ALBUMIN 4.1 04/11/2023   ALBUMIN 3.0 (L) 04/04/2017   CALCIUM 9.2 04/11/2023   CALCIUM 8.6 04/04/2017   ANIONGAP 7 07/31/2021    Radiographic Findings: MR BRAIN W WO CONTRAST Result Date: 09/19/2023 CLINICAL DATA:  Brain metastases, assess treatment response SRS Protocol. History of metastatic breast cancer. EXAM: MRI HEAD WITHOUT AND WITH CONTRAST TECHNIQUE: Multiplanar, multiecho pulse sequences of the brain and surrounding structures were obtained without and with intravenous contrast. CONTRAST:  7 mL Vueway  COMPARISON:  Head MRI 08/07/2022 FINDINGS: Brain: New lesions: None. Larger lesions: None. Stable or smaller lesions: 1. Stable postoperative changes in the right cerebellar hemisphere including a small amount of partially linear and partially nodular enhancement within the resection cavity likely reflecting scarring. Other brain findings: There is no evidence of an acute infarct, intracranial hemorrhage, midline shift, or extra-axial fluid collection.  Cerebral volume is normal with normal size of the ventricles. Minimal nonspecific gliosis in the cerebral white matter is unchanged. Vascular: Major intracranial vascular flow voids are preserved. Skull and upper cervical spine: Previous right retrosigmoid craniotomy. No suspicious marrow lesion. Unchanged chronic, displaced C2 vertebral body fracture. Sinuses/Orbits: Unremarkable orbits. Paranasal sinuses and mastoid air cells are clear. Other: None. IMPRESSION: Stable postoperative changes in the cerebellum. No evidence of new intracranial metastatic disease. Electronically Signed   By: Aundra Lee M.D.   On: 09/19/2023 13:06     Impression/Plan: 1. 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  Her most recent MRI brain from 09/17/23 remains stable without any evidence of recurrent metastatic disease in the brain.  Therefore, we will continue with surveillance MRI brain scan annually and a follow up visit thereafter to review results and recommendations from tumor board. Her most recent systemic imaging from 02/18/23 continues to show no evidence of recurrent or metastatic disease in the chest, abdomen or pelvis.  She met with her medical oncologist, Dr. Lee Public in follow-up on 02/23/23 and the recommendation is to continue in observation since she remains stable, having completed her maintenance immunotherapy with Keytruda  in October 2021, and to continue with repeat CT scans for systemic disease restaging annually.  She knows to call us  with any questions or concerns in the interim and is comfortable and in agreement with this plan.  I personally spent 30 minutes in this encounter including chart review, reviewing radiological studies, meeting face-to-face with the patient, entering orders and completing documentation.     Arta Bihari, PA-C

## 2023-09-26 ENCOUNTER — Encounter

## 2023-10-06 ENCOUNTER — Other Ambulatory Visit: Payer: Self-pay | Admitting: Radiation Therapy

## 2023-10-06 DIAGNOSIS — C7931 Secondary malignant neoplasm of brain: Secondary | ICD-10-CM

## 2023-10-15 ENCOUNTER — Other Ambulatory Visit: Payer: Self-pay | Admitting: Nurse Practitioner

## 2023-10-18 NOTE — Telephone Encounter (Signed)
 Requesting: LEVOTHYROXINE  100 MCG TABLET  Last Visit: 04/11/2023 Next Visit: Visit date not found Last Refill: 05/24/2023  Please Advise

## 2023-10-18 NOTE — Telephone Encounter (Signed)
 I called patient due to receiving a Rx refill request of Levothyrxine from her pharmacy and that she is past due for repeat lab work.  Patient said that since she could not get in to see Lauren that she was scheduled to see [facility] for tomorrow. Wiley said that she specifically said that she needed her thryoid levels checked. She said that she feels like she is on death's door and has low energy and tired all the time.

## 2023-10-19 ENCOUNTER — Ambulatory Visit: Admitting: Nurse Practitioner

## 2023-10-19 ENCOUNTER — Encounter: Payer: Self-pay | Admitting: Nurse Practitioner

## 2023-10-19 ENCOUNTER — Ambulatory Visit: Payer: Self-pay | Admitting: Nurse Practitioner

## 2023-10-19 VITALS — BP 120/80 | HR 85 | Temp 97.6°F | Ht 63.0 in | Wt 184.4 lb

## 2023-10-19 DIAGNOSIS — R1013 Epigastric pain: Secondary | ICD-10-CM | POA: Diagnosis not present

## 2023-10-19 DIAGNOSIS — R5381 Other malaise: Secondary | ICD-10-CM

## 2023-10-19 DIAGNOSIS — E039 Hypothyroidism, unspecified: Secondary | ICD-10-CM

## 2023-10-19 DIAGNOSIS — R5383 Other fatigue: Secondary | ICD-10-CM

## 2023-10-19 DIAGNOSIS — A048 Other specified bacterial intestinal infections: Secondary | ICD-10-CM

## 2023-10-19 LAB — CBC WITH DIFFERENTIAL/PLATELET
Basophils Absolute: 0 10*3/uL (ref 0.0–0.1)
Basophils Relative: 0.9 % (ref 0.0–3.0)
Eosinophils Absolute: 0 10*3/uL (ref 0.0–0.7)
Eosinophils Relative: 1.1 % (ref 0.0–5.0)
HCT: 43.6 % (ref 36.0–46.0)
Hemoglobin: 14.9 g/dL (ref 12.0–15.0)
Lymphocytes Relative: 35.1 % (ref 12.0–46.0)
Lymphs Abs: 1.3 10*3/uL (ref 0.7–4.0)
MCHC: 34.2 g/dL (ref 30.0–36.0)
MCV: 89.2 fl (ref 78.0–100.0)
Monocytes Absolute: 0.4 10*3/uL (ref 0.1–1.0)
Monocytes Relative: 9.8 % (ref 3.0–12.0)
Neutro Abs: 2 10*3/uL (ref 1.4–7.7)
Neutrophils Relative %: 53.1 % (ref 43.0–77.0)
Platelets: 223 10*3/uL (ref 150.0–400.0)
RBC: 4.89 Mil/uL (ref 3.87–5.11)
RDW: 12.8 % (ref 11.5–15.5)
WBC: 3.8 10*3/uL — ABNORMAL LOW (ref 4.0–10.5)

## 2023-10-19 LAB — BASIC METABOLIC PANEL WITH GFR
BUN: 22 mg/dL (ref 6–23)
CO2: 30 meq/L (ref 19–32)
Calcium: 9.7 mg/dL (ref 8.4–10.5)
Chloride: 100 meq/L (ref 96–112)
Creatinine, Ser: 0.76 mg/dL (ref 0.40–1.20)
GFR: 88.18 mL/min (ref >60.00)
Glucose, Bld: 92 mg/dL (ref 70–99)
Potassium: 3.5 meq/L (ref 3.5–5.1)
Sodium: 137 meq/L (ref 135–145)

## 2023-10-19 LAB — POCT URINALYSIS DIPSTICK
Bilirubin, UA: NEGATIVE
Blood, UA: NEGATIVE
Glucose, UA: NEGATIVE
Ketones, UA: NEGATIVE
Leukocytes, UA: NEGATIVE
Nitrite, UA: NEGATIVE
Protein, UA: POSITIVE — AB
Spec Grav, UA: 1.025 (ref 1.010–1.025)
Urobilinogen, UA: 0.2 U/dL
pH, UA: 6 (ref 5.0–8.0)

## 2023-10-19 LAB — HEMOGLOBIN A1C: Hgb A1c MFr Bld: 5.6 % (ref 4.6–6.5)

## 2023-10-19 NOTE — Assessment & Plan Note (Signed)
 Reports persistent nausea, ABDOMEN bloating and globus hystericus. Associated with constipation-improved with OVER THE COUNTER medication Previously evaluated by pcp in November.  Check H pylori and UA today. Normal UA- need for adequate oral hydration

## 2023-10-19 NOTE — Progress Notes (Signed)
 Acute Office Visit  Subjective:    Patient ID: Kaitlyn Keith, female    DOB: December 30, 1965, 58 y.o.   MRN: [id number]  Chief Complaint  Patient presents with   Acute Visit    Thinks her thyroid  needs to be checked has been having low energy hot/cold flashes body aches and pains has been going on for two weeks    HPI Hypothyroidism Presents with persistent fatigue, malaise and hot flashes, waxing and waning Evaluated previously by pcp in November. Levothyroxine  dose was decreased at the time due to low Tsh.  Repeat THYROID  panel today Add cbc, bmp, and UA  Dyspepsia Reports persistent nausea, ABDOMEN bloating and globus hystericus. Associated with constipation-improved with OVER THE COUNTER medication Previously evaluated by pcp in November.  Check H pylori and UA today. Normal UA- need for adequate oral hydration   Outpatient Medications Prior to Visit  Medication Sig   alprazolam  (XANAX ) 2 MG tablet TAKE 1 TABLET BY MOUTH AT BEDTIME.   hydrOXYzine  (ATARAX ) 25 MG tablet Take 1 tablet (25 mg total) by mouth at bedtime as needed for anxiety.   levothyroxine  (SYNTHROID ) 100 MCG tablet TAKE 1 TABLET BY MOUTH EVERY DAY   lisinopril -hydrochlorothiazide  (ZESTORETIC ) 20-12.5 MG tablet Take 1 tablet by mouth daily.   meloxicam  (MOBIC ) 7.5 MG tablet TAKE 1 TABLET BY MOUTH EVERY DAY   [DISCONTINUED] chlorpheniramine-HYDROcodone  (TUSSIONEX) 10-8 MG/5ML Take 5 mLs by mouth every 12 (twelve) hours as needed for cough. Do not take at same time as alprazolam  (xanax )   [DISCONTINUED] atovaquone -proguanil (MALARONE ) 250-100 MG TABS tablet TAKE 1 TAB DAILY START 2 DAYS BEFORE YOUR TRIP, ALL DURING YOUR TRIP, AND 7 DAYS AFTER YOU GET HOME (Patient not taking: Reported on 10/19/2023)   [DISCONTINUED] ibuprofen  (ADVIL ) 800 MG tablet Take 1 tablet (800 mg total) by mouth every 8 (eight) hours as needed. (Patient not taking: Reported on 10/19/2023)   [DISCONTINUED] methylPREDNISolone  (MEDROL  DOSEPAK) 4  MG TBPK tablet Take as directed (Patient not taking: Reported on 10/19/2023)   [DISCONTINUED] ondansetron  (ZOFRAN ) 4 MG tablet Take 1 tablet (4 mg total) by mouth every 8 (eight) hours as needed. (Patient not taking: Reported on 10/19/2023)   [DISCONTINUED] triamcinolone  cream (KENALOG ) 0.1 % Apply 1 Application topically 2 (two) times daily. (Patient not taking: Reported on 10/19/2023)   No facility-administered medications prior to visit.    Reviewed past medical and social history.  Review of Systems Per HPI     Objective:    Physical Exam Vitals reviewed.  Constitutional:      General: She is not in acute distress. Neck:     Thyroid : No thyroid  mass, thyromegaly or thyroid  tenderness.  Cardiovascular:     Rate and Rhythm: Normal rate and regular rhythm.     Pulses: Normal pulses.     Heart sounds: Normal heart sounds.  Pulmonary:     Effort: Pulmonary effort is normal.     Breath sounds: Normal breath sounds.  Musculoskeletal:     Cervical back: Normal range of motion and neck supple.     Right lower leg: No edema.     Left lower leg: No edema.  Lymphadenopathy:     Cervical: No cervical adenopathy.  Neurological:     Mental Status: She is alert and oriented to person, place, and time.     BP 120/80 (BP Location: Right Arm, Patient Position: Sitting, Cuff Size: Normal)   Pulse 85   Temp 97.6 F (36.4 C) (Temporal)   Ht 5\' 3"  (  1.6 m)   Wt 184 lb 6.4 oz (83.6 kg)   SpO2 97%   BMI 32.66 kg/m    Results for orders placed or performed in visit on 10/19/23  POCT urinalysis dipstick  Result Value Ref Range   Color, UA straw    Clarity, UA clear    Glucose, UA Negative Negative   Bilirubin, UA negative    Ketones, UA negative    Spec Grav, UA 1.025 1.010 - 1.025   Blood, UA negative    pH, UA 6.0 5.0 - 8.0   Protein, UA Positive (A) Negative   Urobilinogen, UA 0.2 0.2 or 1.0 E.U./dL   Nitrite, UA negative    Leukocytes, UA Negative Negative   Appearance straw     Odor no       Assessment & Plan:   Problem List Items Addressed This Visit     Dyspepsia   Reports persistent nausea, ABDOMEN bloating and globus hystericus. Associated with constipation-improved with OVER THE COUNTER medication Previously evaluated by pcp in November.  Check H pylori and UA today. Normal UA- need for adequate oral hydration      Relevant Orders   H. pylori breath test   Hypothyroidism   Presents with persistent fatigue, malaise and hot flashes, waxing and waning Evaluated previously by pcp in November. Levothyroxine  dose was decreased at the time due to low Tsh.  Repeat THYROID  panel today Add cbc, bmp, and UA      Relevant Orders   Thyroid  Panel With TSH   Other Visit Diagnoses       Malaise and fatigue    -  Primary   Relevant Orders   Hemoglobin A1c   CBC with Differential/Platelet   Basic metabolic panel with GFR   POCT urinalysis dipstick (Completed)      No orders of the defined types were placed in this encounter.  Return in about 4 weeks (around 11/16/2023) for with Laureen, NP.  Kathrene Parents, NP

## 2023-10-19 NOTE — Patient Instructions (Signed)
 Go to lab Do not take meloxicam  on am empty stomach.

## 2023-10-19 NOTE — Assessment & Plan Note (Signed)
 Presents with persistent fatigue, malaise and hot flashes, waxing and waning Evaluated previously by pcp in November. Levothyroxine  dose was decreased at the time due to low Tsh.  Repeat THYROID  panel today Add cbc, bmp, and UA

## 2023-10-20 LAB — THYROID PANEL WITH TSH
Free Thyroxine Index: 3.6 (ref 1.4–3.8)
T3 Uptake: 32 % (ref 22–35)
T4, Total: 11.2 ug/dL (ref 5.1–11.9)
TSH: 0.47 m[IU]/L

## 2023-10-20 LAB — H. PYLORI BREATH TEST: H. pylori Breath Test: DETECTED — AB

## 2023-10-20 MED ORDER — CLARITHROMYCIN 500 MG PO TABS: 500.0000 mg | ORAL_TABLET | Freq: Two times a day (BID) | ORAL | 0 refills | Status: DC

## 2023-10-20 MED ORDER — PANTOPRAZOLE SODIUM 40 MG PO TBEC: 40.0000 mg | DELAYED_RELEASE_TABLET | Freq: Every day | ORAL | 0 refills | Status: DC

## 2023-10-20 MED ORDER — METRONIDAZOLE 500 MG PO TABS: 500.0000 mg | ORAL_TABLET | Freq: Two times a day (BID) | ORAL | 0 refills | Status: DC

## 2023-10-20 NOTE — Telephone Encounter (Signed)
 Copied from CRM [id number]. Topic: Clinical - Lab/Test Results >> Oct 20, 2023  9:29 AM Kaitlyn Keith wrote: Reason for CRM: Patient is requesting a phone call to discuss lab results from 10/20/2023. Patient would like for her PCP medical assistant to give her a call.

## 2023-10-20 NOTE — Telephone Encounter (Signed)
 Kaitlyn Keith messaged patient regarding her recent lab results.

## 2023-10-21 ENCOUNTER — Other Ambulatory Visit: Payer: Self-pay | Admitting: Nurse Practitioner

## 2023-10-21 NOTE — Telephone Encounter (Signed)
 Requesting: ONDANSETRON  HCL 4 MG TABLET  Last Visit: 04/11/2023 Next Visit: 11/17/2023 Last Refill: 04/11/2023  Please Advise

## 2023-10-24 ENCOUNTER — Other Ambulatory Visit: Payer: Self-pay | Admitting: Nurse Practitioner

## 2023-10-24 MED ORDER — ONDANSETRON HCL 4 MG PO TABS: 4.0000 mg | ORAL_TABLET | Freq: Three times a day (TID) | ORAL | 0 refills | Status: DC | PRN

## 2023-10-25 ENCOUNTER — Telehealth: Payer: Self-pay | Admitting: Nurse Practitioner

## 2023-10-25 MED ORDER — LEVOTHYROXINE SODIUM 100 MCG PO TABS: 100.0000 ug | ORAL_TABLET | Freq: Every day | ORAL | 2 refills | Status: DC

## 2023-10-25 NOTE — Telephone Encounter (Signed)
 Copied from CRM [id number]. Topic: Clinical - Medication Refill >> Oct 25, 2023  8:36 AM Martinique E wrote: Medication: levothyroxine  (SYNTHROID ) 100 MCG tablet  Has the patient contacted their pharmacy? Yes (Agent: If no, request that the patient contact the pharmacy for the refill. If patient does not wish to contact the pharmacy document the reason why and proceed with request.) (Agent: If yes, when and what did the pharmacy advise?)  This is the patient's preferred pharmacy:  [id number] [place],  [place] - [address] [place] [address] Phone: [phone] Fax: [phone]  Is this the correct pharmacy for this prescription? Yes If no, delete pharmacy and type the correct one.   Has the prescription been filled recently? No  Is the patient out of the medication? Yes  Has the patient been seen for an appointment in the last year OR does the patient have an upcoming appointment? Yes  Can we respond through MyChart? Yes  Agent: Please be advised that Rx refills may take up to 3 business days. We ask that you follow-up with your pharmacy.

## 2023-10-25 NOTE — Telephone Encounter (Signed)
 Requesting: levothyroxine  (SYNTHROID ) 100 MCG tablet  Last Visit: 04/11/2023 Next Visit: 11/17/2023 Last Refill: 05/24/2023  Please Advise

## 2023-11-07 ENCOUNTER — Ambulatory Visit: Admitting: Nurse Practitioner

## 2023-11-07 ENCOUNTER — Telehealth: Payer: Self-pay

## 2023-11-07 NOTE — Telephone Encounter (Signed)
 Copied from CRM [id number]. Topic: Clinical - Medical Advice >> Nov 07, 2023  9:52 AM Emmet Harm C wrote: Reason for CRM: Patient wants to know if its too early to get check for her last stomach diagnosis and patient has done all medication and she not feeling well she would like a call before today's appointment

## 2023-11-07 NOTE — Telephone Encounter (Signed)
 I spoke with Kaitlyn Keith and advised that it needs to be at least 8 weeks after finishing her antibiotics for the testing.   I called and spoke with patient and notified her of above message and she requested for appointment for today to be canceled and to reschedule next appointment.

## 2023-11-17 ENCOUNTER — Ambulatory Visit: Admitting: Nurse Practitioner

## 2023-11-19 ENCOUNTER — Other Ambulatory Visit: Payer: Self-pay | Admitting: Podiatry

## 2023-11-19 DIAGNOSIS — M19071 Primary osteoarthritis, right ankle and foot: Secondary | ICD-10-CM

## 2023-12-01 ENCOUNTER — Encounter: Payer: Self-pay | Admitting: Nurse Practitioner

## 2023-12-01 ENCOUNTER — Ambulatory Visit: Admitting: Nurse Practitioner

## 2023-12-01 VITALS — BP 124/86 | HR 59 | Temp 96.8°F | Ht 63.0 in | Wt 186.4 lb

## 2023-12-01 DIAGNOSIS — K5904 Chronic idiopathic constipation: Secondary | ICD-10-CM | POA: Insufficient documentation

## 2023-12-01 DIAGNOSIS — A048 Other specified bacterial intestinal infections: Secondary | ICD-10-CM | POA: Insufficient documentation

## 2023-12-01 DIAGNOSIS — M19042 Primary osteoarthritis, left hand: Secondary | ICD-10-CM

## 2023-12-01 MED ORDER — CELECOXIB 100 MG PO CAPS: 100.0000 mg | ORAL_CAPSULE | Freq: Two times a day (BID) | ORAL | 1 refills | Status: AC

## 2023-12-01 MED ORDER — LUBIPROSTONE 8 MCG PO CAPS: 8.0000 ug | ORAL_CAPSULE | Freq: Every day | ORAL | 1 refills | Status: DC

## 2023-12-01 MED ORDER — LINACLOTIDE 72 MCG PO CAPS: 72.0000 ug | ORAL_CAPSULE | Freq: Every day | ORAL | 1 refills | Status: DC

## 2023-12-01 NOTE — Assessment & Plan Note (Signed)
 Severe arthritis pain is unresponsive to meloxicam . Celebrex  is chosen for its anti-inflammatory properties, with Tylenol  Arthritis recommended for additional relief. Prescribe Celebrex  100 mg twice daily and recommend Tylenol  Arthritis alongside it.

## 2023-12-01 NOTE — Assessment & Plan Note (Signed)
 She experiences persistent nausea and constipation after treatment, without burning or reflux. Will recheck h pylori to ensure resolution. Recommend her husband be tested as well.

## 2023-12-01 NOTE — Patient Instructions (Addendum)
 It was great to see you!  Start amitiza  1 capsule daily with breakfast   Keep drinking plenty of fluids  Stop the meloxicam  and start celebrex  1 capsule twice a day  You can also take tylenol  arthritis   Let's follow-up in 2-3 months, sooner if you have concerns.  If a referral was placed today, you will be contacted for an appointment. Please note that routine referrals can sometimes take up to 3-4 weeks to process. Please call our office if you haven't heard anything after this time frame.  Take care,  Tinnie Harada, NP

## 2023-12-01 NOTE — Progress Notes (Signed)
 Established Patient Office Visit  Subjective   Patient ID: Kaitlyn Keith, female    DOB: 1966/01/04  Age: 58 y.o. MRN: [id number]  Chief Complaint  Patient presents with   gastroenteritis    Follow up    HPI Discussed the use of AI scribe software for clinical note transcription with the patient, who gave verbal consent to proceed.  History of Present Illness   Kaitlyn Keith is a 58 year old female who presents with persistent nausea and constipation following treatment for H. pylori infection.  She experiences constant nausea unrelated to food intake, persisting despite completing antibiotics and Protonix  for recent H Pylori infection. She avoids Zofran  to prevent confusion with medication effects. Constipation has been occurring for the past several months. MiraLAX, increased water, and fiber have been ineffective. She states that some of her bloating, gas, and stomach pain have improved since taking the antibiotics.   She has arthritis and uses meloxicam , which she finds ineffective, and seeks alternative pain management. She is attempting weight loss with reduced food intake and protein shakes but experiences weight fluctuations. She feels tired and rundown. No burning reflux, fever, or diarrhea.       ROS See pertinent positives and negatives per HPI.    Objective:     BP 124/86 (BP Location: Left Arm, Patient Position: Sitting, Cuff Size: Normal)   Pulse (!) 59   Temp (!) 96.8 F (36 C)   Ht 5' 3 (1.6 m)   Wt 186 lb 6.4 oz (84.6 kg)   SpO2 97%   BMI 33.02 kg/m  BP Readings from Last 3 Encounters:  12/01/23 124/86  10/19/23 120/80  04/11/23 116/80   Wt Readings from Last 3 Encounters:  12/01/23 186 lb 6.4 oz (84.6 kg)  10/19/23 184 lb 6.4 oz (83.6 kg)  09/20/23 187 lb (84.8 kg)      Physical Exam Vitals and nursing note reviewed.  Constitutional:      General: She is not in acute distress.    Appearance: Normal appearance.  HENT:     Head:  Normocephalic.  Eyes:     Conjunctiva/sclera: Conjunctivae normal.  Cardiovascular:     Rate and Rhythm: Normal rate and regular rhythm.     Pulses: Normal pulses.     Heart sounds: Normal heart sounds.  Pulmonary:     Effort: Pulmonary effort is normal.     Breath sounds: Normal breath sounds.  Abdominal:     General: There is no distension.     Palpations: Abdomen is soft.     Tenderness: There is no abdominal tenderness. There is no guarding.  Musculoskeletal:     Cervical back: Normal range of motion.  Skin:    General: Skin is warm.  Neurological:     General: No focal deficit present.     Mental Status: She is alert and oriented to person, place, and time.  Psychiatric:        Mood and Affect: Mood normal.        Behavior: Behavior normal.        Thought Content: Thought content normal.        Judgment: Judgment normal.    The 10-year ASCVD risk score (Arnett DK, et al., 2019) is: 4.5%    Assessment & Plan:   Problem List Items Addressed This Visit       Digestive   Chronic idiopathic constipation   Constipation continues despite MiraLAX, hydration, and fiber intake. Start Amitiza  8 mcg once daily with  breakfast and advise monitoring for diarrhea.        Musculoskeletal and Integument   Arthritis of finger of left hand   Severe arthritis pain is unresponsive to meloxicam . Celebrex  is chosen for its anti-inflammatory properties, with Tylenol  Arthritis recommended for additional relief. Prescribe Celebrex  100 mg twice daily and recommend Tylenol  Arthritis alongside it.       Relevant Medications   celecoxib  (CELEBREX ) 100 MG capsule     Other   H. pylori infection - Primary   She experiences persistent nausea and constipation after treatment, without burning or reflux. Will recheck h pylori to ensure resolution. Recommend her husband be tested as well.       Relevant Orders   H. pylori breath test    Return in about 2 months (around 02/01/2024) for CPE.     Tinnie DELENA Harada, NP

## 2023-12-01 NOTE — Assessment & Plan Note (Signed)
 Constipation continues despite MiraLAX, hydration, and fiber intake. Start Amitiza  8 mcg once daily with breakfast and advise monitoring for diarrhea.

## 2023-12-02 ENCOUNTER — Ambulatory Visit: Payer: Self-pay | Admitting: Nurse Practitioner

## 2023-12-02 DIAGNOSIS — A048 Other specified bacterial intestinal infections: Secondary | ICD-10-CM

## 2023-12-02 LAB — H. PYLORI BREATH TEST

## 2023-12-05 NOTE — Telephone Encounter (Signed)
 I called and spoke with patient and notified her of below message and she would like to do a stool sample.

## 2024-01-02 ENCOUNTER — Other Ambulatory Visit: Payer: Self-pay | Admitting: Nurse Practitioner

## 2024-01-02 ENCOUNTER — Other Ambulatory Visit (INDEPENDENT_AMBULATORY_CARE_PROVIDER_SITE_OTHER)

## 2024-01-02 DIAGNOSIS — A048 Other specified bacterial intestinal infections: Secondary | ICD-10-CM

## 2024-01-02 DIAGNOSIS — E039 Hypothyroidism, unspecified: Secondary | ICD-10-CM | POA: Diagnosis not present

## 2024-01-02 LAB — TSH: TSH: 0.41 u[IU]/mL (ref 0.35–5.50)

## 2024-01-02 NOTE — Telephone Encounter (Signed)
 Requesting: LUBIPROSTONE  8 MCG CAPSULE  Last Visit: 12/01/2023 Next Visit: Visit date not found Last Refill: 12/01/2023  Please Advise   Pharmacy comment: REQUEST FOR 90 DAYS PRESCRIPTION.

## 2024-01-03 LAB — H. PYLORI BREATH TEST: H. pylori Breath Test: NOT DETECTED

## 2024-01-04 ENCOUNTER — Telehealth: Payer: Self-pay

## 2024-01-04 ENCOUNTER — Ambulatory Visit: Payer: Self-pay | Admitting: Nurse Practitioner

## 2024-01-04 NOTE — Telephone Encounter (Signed)
 Copied from CRM [id number]. Topic: Clinical - Medication Question >> Jan 04, 2024  9:57 AM Kaitlyn Keith wrote: Reason for CRM: :Patient would like to know if we can prescribe her the chlorpheniramine-HYDROcodone  [facility]) 10-8 MG/5ML  she once took for the cough, She meant to tell PCP yesterday during appt.  [id number] [place], [place] - [address] [place] [address] Phone: [phone] Fax: [phone] Hours: Not open 24 hours

## 2024-01-04 NOTE — Telephone Encounter (Signed)
 Patient notified of below message and said she will wait.

## 2024-01-04 NOTE — Telephone Encounter (Signed)
 Requesting: chlorpheniramine-HYDROcodone  (TUSSIONEX PENNKINETIC ER) 10-8 MG/5ML  Last Visit: 12/01/2023 Next Visit: Visit date not found Last Refill: 04/11/2023  Please Advise

## 2024-02-18 ENCOUNTER — Ambulatory Visit (HOSPITAL_BASED_OUTPATIENT_CLINIC_OR_DEPARTMENT_OTHER)
Admission: RE | Admit: 2024-02-18 | Discharge: 2024-02-18 | Disposition: A | Discharge status: Home / Self Care | Source: Ambulatory Visit | Attending: Hematology and Oncology | Admitting: Hematology and Oncology

## 2024-02-18 DIAGNOSIS — Z171 Estrogen receptor negative status [ER-]: Secondary | ICD-10-CM | POA: Insufficient documentation

## 2024-02-18 DIAGNOSIS — C50511 Malignant neoplasm of lower-outer quadrant of right female breast: Secondary | ICD-10-CM | POA: Diagnosis present

## 2024-02-18 DIAGNOSIS — C792 Secondary malignant neoplasm of skin: Secondary | ICD-10-CM | POA: Diagnosis present

## 2024-02-18 MED ORDER — IOHEXOL 300 MG/ML  SOLN
100.0000 mL | Freq: Once | INTRAMUSCULAR | Status: AC | PRN
  Administered 2024-02-18: 100 mL via INTRAVENOUS

## 2024-03-14 ENCOUNTER — Encounter: Payer: Self-pay | Admitting: Hematology and Oncology

## 2024-03-22 ENCOUNTER — Ambulatory Visit

## 2024-03-22 ENCOUNTER — Ambulatory Visit (INDEPENDENT_AMBULATORY_CARE_PROVIDER_SITE_OTHER)

## 2024-03-22 ENCOUNTER — Ambulatory Visit: Admitting: Podiatry

## 2024-03-22 VITALS — Ht 63.0 in | Wt 186.4 lb

## 2024-03-22 DIAGNOSIS — M79672 Pain in left foot: Secondary | ICD-10-CM

## 2024-03-22 DIAGNOSIS — B351 Tinea unguium: Secondary | ICD-10-CM | POA: Diagnosis not present

## 2024-03-22 DIAGNOSIS — M79671 Pain in right foot: Secondary | ICD-10-CM

## 2024-03-22 DIAGNOSIS — M19071 Primary osteoarthritis, right ankle and foot: Secondary | ICD-10-CM

## 2024-03-22 DIAGNOSIS — M2042 Other hammer toe(s) (acquired), left foot: Secondary | ICD-10-CM | POA: Diagnosis not present

## 2024-03-22 DIAGNOSIS — M2041 Other hammer toe(s) (acquired), right foot: Secondary | ICD-10-CM | POA: Diagnosis not present

## 2024-03-22 DIAGNOSIS — S91209A Unspecified open wound of unspecified toe(s) with damage to nail, initial encounter: Secondary | ICD-10-CM

## 2024-03-22 NOTE — Progress Notes (Signed)
 Subjective:  Patient ID: Kaitlyn Keith, female    DOB: [birth year],  MRN: [id number]  Chief Complaint  Patient presents with   Foot Problem    RM 1 something going on w/ 2nd toe L turning purple, throbbing pain also having severe pain on top R foot. Lt ft- pain in 2nd toe red and swollen around nail bed/tip of toe. Rt Ft- Pain on dorsal aspect of foot, patient requesting a steroid shot, has tried Celebrex  and Meloxicam .    Discussed the use of AI scribe software for clinical note transcription with the patient, who gave verbal consent to proceed.  History of Present Illness Kaitlyn Keith is a 58 year old female who presents with bilateral foot pain and left toenail issues.  She experiences bilateral foot pain that began after preparing for a bonfire last weekend. The pain is most severe in the left second toe, described as severe and throbbing, and has been affecting her sleep. She denies any trauma to the toe but wore regular yard shoes and black socks during the event. The pain is described as 'horrendous' and has led to sleep deprivation.  She has a history of bunion surgery and experiences significant pain in the right midfoot, described as 'cringeworthy' and 'bringing her to tears.' The pain is exacerbated at night and radiates from the midfoot. She has tried meloxicam  and Celebrex  for pain relief, but both have been ineffective. The pain is particularly severe when pressure is applied to the dorsal midfoot area.  Regarding her left second toenail, she describes onycholysis with blistering around the nail bed. She has acrylic on the toenail and is concerned about the nail lifting off. She attributes the nail issues to stress on her foot and mentions that the toenail was not like this until her foot problems began.  She denies being diabetic and questions whether the stress on her foot could be causing her toenail issues. She recalls a previous successful toe surgery and inquires if the  current situation is similar to that experience.      Objective:    Physical Exam VASCULAR: DP and PT pulse palpable. Foot is warm and well-perfused. Capillary fill time is brisk. DERMATOLOGIC: Onycholysis with blistering around the second toenail bed, no purulence or signs of infection. Normal skin turgor, texture, and temperature. No open lesions, rashes, or ulcerations. NEUROLOGIC: Normal sensation to light touch and pressure. No paresthesias. ORTHOPEDIC: Pain to palpation on dorsal midfoot with palpable spurring at second TMTJ on left foot.   No images are attached to the encounter.    Results RADIOLOGY Bilateral foot radiographs: Previous bunionectomy, she has hammertoe deformities, end-stage arthrosis of the second tarsometatarsal (TMT) joint on the right foot, no fracture on the left foot (03/22/2024)  PROCEDURE Removal of left second toenail Description: Following consent, the toe was prepped with alcohol and anesthetized with lidocaine  and Marcaine . Betadine was applied, and a tourniquet was secured. A freer elevator was used to remove the left second toenail plate in totality. No evidence of infection or nail bed laceration was found. The area was dressed with a sterile bandage and Silvadene  Corticosteroid injection right foot Description: Following consent the dorsal midfoot was prepped with Betadine.  Utilizing ethyl chloride spray as a topical anesthetic the second TMT was injected with 20 mg of Kenalog  and 5 mg Marcaine .  She tolerated well and was dressed with a Band-Aid..   Assessment:   1. Pain in both feet      Plan:  Patient  was evaluated and treated and all questions answered.  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 The condition is very painful, especially at night, and previous treatments with meloxicam   and Celebrex  have been ineffective. - Administer a corticosteroid injection with 20 mg of Kenalog  and 5 mg of Marcaine  to the second TMTJ in the dorsal midfoot. - Monitor for effectiveness of the injection, aiming for at least 3 months of relief. - Discuss potential for further injections if effective and no adverse effects such as skin thinning or color changes occur. - Consider long-term arthrodesis if symptoms persist or worsen.  Left second toenail onycholysis with traumatic nail injury Onycholysis with blistering around the left second toenail bed, likely due to pressure and repetitive impact from footwear during activity. No signs of infection or purulence observed. The nail is lifted off, necessitating removal to prevent potential infection. - Remove the left second toenail under local anesthesia with lidocaine  and Marcaine . - Dress the toe with a sterile bandage and provide post-procedure care instructions. - Advise soaking the toe twice daily for one week and applying Neosporin with a Band-Aid. - Inform that nail regrowth will take 4-6 months and may have some residual dystrophy.      No follow-ups on file.

## 2024-03-31 ENCOUNTER — Ambulatory Visit

## 2024-03-31 ENCOUNTER — Emergency Department (HOSPITAL_COMMUNITY)

## 2024-03-31 ENCOUNTER — Other Ambulatory Visit: Payer: Self-pay

## 2024-03-31 ENCOUNTER — Emergency Department (HOSPITAL_COMMUNITY)
Admission: EM | Admit: 2024-03-31 | Discharge: 2024-03-31 | Disposition: A | Discharge status: Home / Self Care | Attending: Emergency Medicine | Admitting: Emergency Medicine

## 2024-03-31 ENCOUNTER — Encounter (HOSPITAL_COMMUNITY): Payer: Self-pay | Admitting: *Deleted

## 2024-03-31 ENCOUNTER — Ambulatory Visit: Admission: EM | Admit: 2024-03-31 | Discharge: 2024-03-31 | Attending: Internal Medicine | Admitting: Internal Medicine

## 2024-03-31 DIAGNOSIS — S61225A Laceration with foreign body of left ring finger without damage to nail, initial encounter: Secondary | ICD-10-CM

## 2024-03-31 DIAGNOSIS — I10 Essential (primary) hypertension: Secondary | ICD-10-CM | POA: Diagnosis not present

## 2024-03-31 DIAGNOSIS — Z85841 Personal history of malignant neoplasm of brain: Secondary | ICD-10-CM | POA: Insufficient documentation

## 2024-03-31 DIAGNOSIS — W01198A Fall on same level from slipping, tripping and stumbling with subsequent striking against other object, initial encounter: Secondary | ICD-10-CM | POA: Insufficient documentation

## 2024-03-31 DIAGNOSIS — Z79899 Other long term (current) drug therapy: Secondary | ICD-10-CM | POA: Diagnosis not present

## 2024-03-31 DIAGNOSIS — Z23 Encounter for immunization: Secondary | ICD-10-CM | POA: Insufficient documentation

## 2024-03-31 DIAGNOSIS — E039 Hypothyroidism, unspecified: Secondary | ICD-10-CM | POA: Insufficient documentation

## 2024-03-31 DIAGNOSIS — Z853 Personal history of malignant neoplasm of breast: Secondary | ICD-10-CM | POA: Insufficient documentation

## 2024-03-31 DIAGNOSIS — X18XXXA Contact with other hot metals, initial encounter: Secondary | ICD-10-CM | POA: Diagnosis not present

## 2024-03-31 DIAGNOSIS — S6992XA Unspecified injury of left wrist, hand and finger(s), initial encounter: Secondary | ICD-10-CM | POA: Diagnosis present

## 2024-03-31 MED ORDER — TETANUS-DIPHTH-ACELL PERTUSSIS 5-2-15.5 LF-MCG/0.5 IM SUSP
0.5000 mL | Freq: Once | INTRAMUSCULAR | Status: AC
  Administered 2024-03-31: 0.5 mL via INTRAMUSCULAR
  Filled 2024-03-31: qty 0.5

## 2024-03-31 MED ORDER — OXYCODONE-ACETAMINOPHEN 5-325 MG PO TABS: 1.0000 | ORAL_TABLET | Freq: Four times a day (QID) | ORAL | 0 refills | Status: AC | PRN

## 2024-03-31 MED ORDER — CEPHALEXIN 500 MG PO CAPS: 500.0000 mg | ORAL_CAPSULE | Freq: Two times a day (BID) | ORAL | 0 refills | Status: AC

## 2024-03-31 NOTE — ED Provider Notes (Signed)
 [place] EMERGENCY DEPARTMENT AT [facility] Provider Note  CSN: [id number] Arrival date & time: 03/31/24 1413  Chief Complaint(s) Finger Injury  HPI Kaitlyn Keith is a 58 y.o. female who is here today with a finger laceration.  Patient was using a grill, the lid fell down and struck her left ring finger.  Unsure of last tetanus shot.  She went to an urgent care, they performed x-rays, and were concerned about the potential over there being retained metallic bodies so they sent to the ED for evaluation.   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 Marylen underwent genetic counseling and testing for hereditary cancer syndromes on 11/18/2016. 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 Mutations in MSH6 are associated with a hereditary cancer syndrome called Lynch syndrome. For more detailed discussion, please see genetic counseling documentation from 12/10/2016.  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 Marylen underwent genetic counseling and testing for hereditary cancer syndromes on 11/18/2016. 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 Mutations in MSH6 are associated with a hereditary cancer syndrome called Lynch syndrome. For more detailed discussion, please see genetic counseling documentation from 12/10/2016.  Testing was perfo   Radiation 07/23/15-09/05/15    right breast 50.4 Gy, boost of 10 Gy   Patient Active Problem List   Diagnosis Date Noted   Chronic idiopathic constipation 12/01/2023   H. 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 Authorizing Provider  alprazolam  (XANAX ) 2 MG tablet TAKE 1 TABLET BY MOUTH AT BEDTIME. 03/08/23   McElwee, Lauren A, NP  celecoxib  (CELEBREX ) 100 MG capsule Take 1 capsule (100 mg total) by mouth 2 (two) times daily. 12/01/23   McElwee, Tinnie LABOR, NP  hydrOXYzine  (ATARAX ) 25 MG tablet Take 1 tablet (25 mg total) by mouth at bedtime as needed for anxiety. 04/11/23   Nedra Tinnie LABOR, NP  levothyroxine  (SYNTHROID ) 100 MCG tablet Take 1 tablet (100 mcg total) by mouth daily. 10/25/23   McElwee, Tinnie LABOR, NP  lisinopril -hydrochlorothiazide  (ZESTORETIC ) 20-12.5 MG tablet Take 1 tablet by mouth daily. 04/11/23   McElwee, Lauren A, NP  lubiprostone  (AMITIZA ) 8 MCG capsule TAKE 1 CAPSULE (8 MCG TOTAL) BY MOUTH DAILY WITH BREAKFAST. 01/02/24   McElwee,  Tinnie LABOR, NP  ondansetron  (ZOFRAN ) 4 MG tablet Take 1 tablet (4 mg total) by mouth every 8 (eight) hours as needed. 10/24/23   McElwee, Lauren A, NP  gabapentin  (NEURONTIN ) 300 MG capsule Take 1 capsule (300 mg total) by mouth 3 (three) times daily. 06/15/19 09/16/19  Odean Potts, MD                                                                                                                                    Past Surgical History Past Surgical History:  Procedure Laterality Date   APPLICATION OF CRANIAL NAVIGATION Right 07/27/2016   Procedure: APPLICATION OF CRANIAL NAVIGATION;  Surgeon: Morene Hicks Ditty, MD;  Location: [facility] OR;  Service: Neurosurgery;  Laterality: Right;   BIOPSY OF SKIN SUBCUTANEOUS TISSUE AND/OR MUCOUS MEMBRANE Left 12/24/2016   Procedure: OPEN BIOPSY LESIONS ON LEFT LOWER BACK AND SHOULDER BLADE;  Surgeon: Curvin Deward MOULD, MD;  Location: [facility] OR;  Service: General;  Laterality: Left;   BREAST LUMPECTOMY WITH NEEDLE LOCALIZATION AND AXILLARY SENTINEL LYMPH NODE BX Right 06/16/2015   Procedure: BREAST LUMPECTOMY WITH NEEDLE LOCALIZATION AND AXILLARY SENTINEL LYMPH NODE BX;  Surgeon: Deward Curvin III, MD;  Location: [facility];  Service: General;  Laterality: Right;   BREAST REDUCTION SURGERY     CRANIOTOMY Right 07/27/2016   Procedure: Right Suboccipital craniotomy for tumor resection with stereotactic navigation;  Surgeon: Morene Hicks Ditty, MD;  Location: [facility] OR;  Service: Neurosurgery;  Laterality: Right;   PORT-A-CATH REMOVAL N/A 06/16/2015   Procedure: REMOVAL PORT-A-CATH;  Surgeon: Deward Curvin III, MD;  Location: [facility];  Service: General;  Laterality: N/A;   PORT-A-CATH REMOVAL Left 04/21/2020   Procedure: REMOVAL PORT-A-CATH;  Surgeon: Curvin Deward MOULD, MD;  Location: [facility];  Service: General;  Laterality: Left;   PORTACATH PLACEMENT N/A 12/23/2014   Procedure: INSERTION PORT-A-CATH;  Surgeon: Deward Curvin III, MD;  Location: [facility];  Service: General;  Laterality: N/A;   PORTACATH PLACEMENT Left 07/08/2017   Procedure: INSERTION PORT-A-CATH;  Surgeon: Curvin Deward MOULD, MD;  Location: [facility];  Service: General;  Laterality: Left;   Family History Family History  Problem Relation Age of Onset   Aneurysm Mother 51       d.55   Heart attack Father 40       d.62   Endometrial cancer Sister 34   Lung cancer Maternal Uncle        d.68s   Cancer Maternal Grandmother  unspecified type-possibly stomach d.88s    Social History Social History   Tobacco Use   Smoking status: Never   Smokeless tobacco: Never  Vaping Use   Vaping status: Never Used  Substance Use Topics   Alcohol use: Yes    Comment: social   Drug use: No   Allergies Patient has no known allergies.  Review of Systems Review of Systems  Physical Exam Vital Signs  I have reviewed the triage vital signs BP (!) 156/87 (BP Location: Right Arm)   Pulse 72   Temp 98.2 F (36.8 C) (Oral)   Resp 18   Ht 5' 3 (1.6 m)   Wt 81.6 kg   LMP  (LMP Unknown)   SpO2 96%   BMI 31.89 kg/m   Physical Exam Vitals and nursing note reviewed.  Musculoskeletal:     Comments: On the fourth finger on the left hand there is a laceration surrounding the palmar, radial portion of the tip just lateral to the nail, extending proximally to the PIP along the palmar aspect and wrapping to the ulnar side of the distal fourth finger.  There is no nailbed involvement, no subungual hematoma.     ED Results and Treatments Labs (all labs ordered are listed, but only abnormal results are displayed) Labs Reviewed - No data to display                                                                                                                        Radiology DG Finger Ring Left Result Date: 03/31/2024 EXAM: 3 VIEW(S) XRAY OF THE FINGER(S) 03/31/2024 12:55:20 PM COMPARISON: None available. CLINICAL HISTORY: Pain. Injury.  Swelling. FINDINGS: BONES AND JOINTS: No acute fracture. No focal osseous lesion. No joint dislocation. Mild degenerative change of the 4th distal interphalangeal joint. SOFT TISSUES: Soft tissue edema about the 4th distal phalanx. There are multiple tiny radiodensities within the soft tissues along the volar aspect of the 4th distal phalanx which may reflect retained foreign body status post trauma. IMPRESSION: 1. Soft tissue edema of the 4th distal phalanx with multiple tiny radiodensities along the volar aspect, suspicious for retained foreign bodies. Electronically signed by: Waddell Calk MD 03/31/2024 01:18 PM EST RP Workstation: [id number]    Pertinent labs & imaging results that were available during my care of the patient were reviewed by me and considered in my medical decision making (see MDM for details).  Medications Ordered in ED Medications  Tdap (ADACEL) injection 0.5 mL (0.5 mLs Intramuscular Given 03/31/24 1502)  Procedures .Laceration Repair  Date/Time: 03/31/2024 3:40 PM  Performed by: Mannie Fairy DASEN, DO Authorized by: Mannie Fairy DASEN, DO   Laceration details:    Length (cm):  7.9 Repair type:    Repair type:  Intermediate Comments:     Area anesthetized using 10 cc 1% lidocaine  without epinephrine , digital block.  Once anesthetized, area irrigated with 2 L sterile saline, followed by 50 cc of pressurized washing.  Foreign debris noted, appeared to be organic material, possibly charcoal, and removed.  Area probed, no additional foreign debris noted.  Placed 10 simple interrupted sutures using 4-0 Prolene with hemostasis achieved.  Nursing placed dressing and bacitracin .  Discharged with prophylactic antibiotics.   (including critical care time)  Medical Decision Making / ED Course   This patient presents to the ED for concern of  finger laceration, this involves an extensive number of treatment options, and is a complaint that carries with it a high risk of complications and morbidity.  The differential diagnosis includes finger laceration, retained foreign body.  MDM: Reviewed images from urgent care.  No fracture, question of some retained foreign body.  Appears to possibly been some charcoal that was on the grill.  Area irrigated extensively and removed.  Repaired.  Patient neurovascular intact following repair.  Tdap provided.  Discharged with prophylactic antibiotics.  Wound care discussed with patient at bedside.   Additional history obtained:  -External records from outside source obtained and reviewed including: Chart review including previous notes, labs, imaging, consultation notes   Lab Tests: -I ordered, reviewed, and interpreted labs.   The pertinent results include:   Labs Reviewed - No data to display   Medicines ordered and prescription drug management: Meds ordered this encounter  Medications   Tdap (ADACEL) injection 0.5 mL    -I have reviewed the patients home medicines and have made adjustments as needed   Cardiac Monitoring: The patient was maintained on a cardiac monitor.  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 Marylen underwent genetic counseling and  testing for hereditary cancer syndromes on 11/18/2016. 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 Mutations in MSH6 are associated with a hereditary cancer syndrome called Lynch syndrome. For more detailed discussion, please see genetic counseling documentation from 12/10/2016.  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 Marylen underwent genetic counseling and testing for hereditary cancer syndromes on 11/18/2016. 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 Mutations in MSH6 are associated with a hereditary cancer syndrome called Lynch syndrome. For more detailed discussion, please see genetic counseling  documentation from 12/10/2016.  Testing was perfo   Radiation 07/23/15-09/05/15   right breast 50.4 Gy, boost of 10 Gy        Final Clinical Impression(s) / ED Diagnoses Final diagnoses:  Laceration of left ring finger with foreign body without damage to nail, initial encounter     @PCDICTATION @    Mannie Pac T, DO 03/31/24 1543

## 2024-03-31 NOTE — ED Triage Notes (Signed)
 Patient reports to Urgent Care due to injury. While picking up grill, the lid closed on left hand (ring finger), now laceration/abrasion/pain/swelling and decreased feeling in finger.

## 2024-03-31 NOTE — Discharge Instructions (Signed)
 Due to the extent of the laceration including x-ray findings of possible metallic foreign bodies, we recommend further evaluation at the emergency department as hand surgery may need to be consulted for further evaluation.

## 2024-03-31 NOTE — ED Provider Notes (Signed)
 [facility]    CSN: [id number] Arrival date & time: 03/31/24  1221      History   Chief Complaint Chief Complaint  Patient presents with   Laceration    HPI Kaitlyn Keith is a 58 y.o. female.   58 year old female presents urgent care with complaints of a laceration on the left ring finger.  She reports that she was picking up a grill and the lid slammed down on her finger.  She suffered a significant laceration to the finger.  She reports that the finger is very painful but that the end of the finger is almost completely numb.  The grill was metal.  She denies any other injury.   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 Marylen underwent genetic counseling and testing for hereditary cancer syndromes on 11/18/2016. 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 Mutations in MSH6 are associated with a hereditary cancer syndrome called Lynch syndrome. For more detailed discussion, please see genetic counseling documentation from 12/10/2016.  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 Marylen underwent genetic counseling and testing for hereditary cancer syndromes on 11/18/2016. 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 Mutations in MSH6 are associated with a hereditary cancer syndrome called Lynch syndrome. For more  detailed discussion, please see genetic counseling documentation from 12/10/2016.  Testing was perfo   Radiation 07/23/15-09/05/15   right breast 50.4 Gy, boost of 10 Gy    Patient Active Problem List   Diagnosis Date Noted   Chronic idiopathic constipation 12/01/2023   H. 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   Cerebellar mass 07/27/2016   Cerebellar tumor (HCC) 07/27/2016   C2 cervical fracture (HCC) 07/12/2016   Hypertension 07/12/2016   Degenerative spinal arthritis 11/18/2015   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   Paronychia of great toe, left 02/04/2015   Breast cancer of lower-outer quadrant of right female breast (HCC) 12/05/2014    Past Surgical History:  Procedure Laterality Date   APPLICATION OF CRANIAL NAVIGATION Right 07/27/2016   Procedure: APPLICATION OF CRANIAL NAVIGATION;  Surgeon: Morene Hicks Ditty, MD;  Location: [facility] OR;  Service: Neurosurgery;  Laterality: Right;   BIOPSY OF SKIN SUBCUTANEOUS TISSUE AND/OR MUCOUS MEMBRANE Left 12/24/2016  Procedure: OPEN BIOPSY LESIONS ON LEFT LOWER BACK AND SHOULDER BLADE;  Surgeon: Curvin Deward MOULD, MD;  Location: [facility] OR;  Service: General;  Laterality: Left;   BREAST LUMPECTOMY WITH NEEDLE LOCALIZATION AND AXILLARY SENTINEL LYMPH NODE BX Right 06/16/2015   Procedure: BREAST LUMPECTOMY WITH NEEDLE LOCALIZATION AND AXILLARY SENTINEL LYMPH NODE BX;  Surgeon: Deward Curvin III, MD;  Location:  [facility];  Service: General;  Laterality: Right;   BREAST REDUCTION SURGERY     CRANIOTOMY Right 07/27/2016   Procedure: Right Suboccipital craniotomy for tumor resection with stereotactic navigation;  Surgeon: Morene Hicks Ditty, MD;  Location: [facility] OR;  Service: Neurosurgery;  Laterality: Right;   PORT-A-CATH REMOVAL N/A 06/16/2015   Procedure: REMOVAL PORT-A-CATH;  Surgeon: Deward Curvin III, MD;  Location: [facility];  Service: General;  Laterality: N/A;   PORT-A-CATH REMOVAL Left 04/21/2020   Procedure: REMOVAL PORT-A-CATH;  Surgeon: Curvin Deward MOULD, MD;  Location: [facility];  Service: General;  Laterality: Left;   PORTACATH PLACEMENT N/A 12/23/2014   Procedure: INSERTION PORT-A-CATH;  Surgeon: Deward Curvin III, MD;  Location: [facility];  Service: General;  Laterality: N/A;   PORTACATH PLACEMENT Left 07/08/2017   Procedure: INSERTION PORT-A-CATH;  Surgeon: Curvin Deward MOULD, MD;  Location: [facility];  Service: General;  Laterality: Left;    OB History   No obstetric history on file.      Home Medications    Prior to Admission medications   Medication Sig Start Date End Date Taking? Authorizing Provider  levothyroxine  (SYNTHROID ) 100 MCG tablet Take 1 tablet (100 mcg total) by mouth daily. 10/25/23  Yes McElwee, Lauren A, NP  lisinopril -hydrochlorothiazide  (ZESTORETIC ) 20-12.5 MG tablet Take 1 tablet by mouth daily. 04/11/23  Yes McElwee, Lauren A, NP  alprazolam  (XANAX ) 2 MG tablet TAKE 1 TABLET BY MOUTH AT BEDTIME. 03/08/23   McElwee, Lauren A, NP  celecoxib  (CELEBREX ) 100 MG capsule Take 1 capsule (100 mg total) by mouth 2 (two) times daily. 12/01/23   McElwee, Tinnie LABOR, NP  hydrOXYzine  (ATARAX ) 25 MG tablet Take 1 tablet (25 mg total) by mouth at bedtime as needed for anxiety. 04/11/23   McElwee, Lauren A, NP  lubiprostone  (AMITIZA ) 8 MCG capsule TAKE 1 CAPSULE (8 MCG TOTAL) BY MOUTH DAILY WITH BREAKFAST. 01/02/24    McElwee, Lauren A, NP  ondansetron  (ZOFRAN ) 4 MG tablet Take 1 tablet (4 mg total) by mouth every 8 (eight) hours as needed. 10/24/23   McElwee, Lauren A, NP  gabapentin  (NEURONTIN ) 300 MG capsule Take 1 capsule (300 mg total) by mouth 3 (three) times daily. 06/15/19 09/16/19  Odean Potts, MD    Family History Family History  Problem Relation Age of Onset   Aneurysm Mother 38       d.55   Heart attack Father 30       d.62   Endometrial cancer Sister 58   Lung cancer Maternal Uncle        d.68s   Cancer Maternal Grandmother        unspecified type-possibly stomach d.88s    Social History Social History   Tobacco Use   Smoking status: Never   Smokeless tobacco: Never  Vaping Use   Vaping status: Never Used  Substance Use Topics   Alcohol use: Yes    Comment: social   Drug use: No     Allergies   Patient has no known allergies.   Review of Systems Review of Systems  Constitutional:  Negative for chills and fever.  HENT:  Negative for ear pain and sore throat.   Eyes:  Negative for pain and visual disturbance.  Respiratory:  Negative for cough and shortness of breath.   Cardiovascular:  Negative for chest pain and palpitations.  Gastrointestinal:  Negative for abdominal pain and vomiting.  Genitourinary:  Negative for dysuria and hematuria.  Musculoskeletal:  Negative for arthralgias and back pain.  Skin:  Positive for wound. Negative for color change and rash.  Neurological:  Negative for seizures and syncope.  All other systems reviewed and are negative.    Physical Exam Triage Vital Signs ED Triage Vitals  Encounter Vitals Group     BP 03/31/24 1241 117/83     Girls Systolic BP Percentile --      Girls Diastolic BP Percentile --      Boys Systolic BP Percentile --      Boys Diastolic BP Percentile --      Pulse Rate 03/31/24 1241 85     Resp 03/31/24 1241 20     Temp 03/31/24 1241 98 F (36.7 C)     Temp Source 03/31/24 1241 Oral     SpO2 03/31/24 1241  99 %     Weight 03/31/24 1240 180 lb (81.6 kg)     Height 03/31/24 1240 5' 3 (1.6 m)     Head Circumference --      Peak Flow --      Pain Score 03/31/24 1235 10     Pain Loc --      Pain Education --      Exclude from Growth Chart --    No data found.  Updated Vital Signs BP 117/83 (BP Location: Right Arm)   Pulse 85   Temp 98 F (36.7 C) (Oral)   Resp 20   Ht 5' 3 (1.6 m)   Wt 180 lb (81.6 kg)   LMP  (LMP Unknown)   SpO2 99%   BMI 31.89 kg/m   Visual Acuity Right Eye Distance:   Left Eye Distance:   Bilateral Distance:    Right Eye Near:   Left Eye Near:    Bilateral Near:     Physical Exam Vitals and nursing note reviewed.  Constitutional:      General: She is not in acute distress.    Appearance: She is well-developed.  HENT:     Head: Normocephalic and atraumatic.  Eyes:     Conjunctiva/sclera: Conjunctivae normal.  Cardiovascular:     Rate and Rhythm: Normal rate and regular rhythm.  Pulmonary:     Effort: Pulmonary effort is normal. No respiratory distress.  Musculoskeletal:        General: No swelling.     Cervical back: Neck supple.  Skin:    General: Skin is warm and dry.     Capillary Refill: Capillary refill takes less than 2 seconds.     Comments: See media for laceration pictures  Neurological:     Mental Status: She is alert.  Psychiatric:        Mood and Affect: Mood normal.              UC Treatments / Results  Labs (all labs ordered are listed, but only abnormal results are displayed) Labs Reviewed - No data to display  EKG   Radiology DG Finger Ring Left Result Date: 03/31/2024 EXAM: 3 VIEW(S) XRAY OF THE FINGER(S) 03/31/2024 12:55:20 PM COMPARISON: None available. CLINICAL HISTORY: Pain. Injury.  Swelling. FINDINGS: BONES AND JOINTS: No acute fracture. No focal osseous lesion. No joint dislocation. Mild degenerative change of the 4th distal interphalangeal joint. SOFT TISSUES: Soft tissue edema about the 4th distal  phalanx. There are multiple tiny radiodensities within the soft tissues along the volar aspect of the 4th distal phalanx which may reflect retained foreign body status post trauma. IMPRESSION: 1. Soft tissue edema of the 4th distal phalanx with multiple tiny radiodensities along the volar aspect, suspicious for retained foreign bodies. Electronically signed by: Waddell Calk MD 03/31/2024 01:18 PM EST RP Workstation: [id number]    Procedures Procedures (including critical care time)  Medications Ordered in UC Medications - No data to display  Initial Impression / Assessment and Plan / UC Course  I have reviewed the triage vital signs and the nursing notes.  Pertinent labs & imaging results that were available during my care of the patient were reviewed by me and considered in my medical decision making (see chart for details).     Laceration of left ring finger with foreign body without damage to nail, initial encounter   The laceration is extensive extending from the middle aspect of the ring finger on the palmar aspect along the medial aspect under the nail and then extending down the lateral aspect to almost reconnect with the middle palmar.  1 area is extremely deep and bone is felt.  There is evidence of possible neuro compromise.  X-ray done today does not show any acute fracture but there is multiple tiny metallic foreign bodies.  Given this we recommend that this be evaluated at the emergency room as a hand surgeon may need to evaluate this for more complex closure.  This was discussed with Dr. Vonna as well who agrees.  Final Clinical Impressions(s) / UC Diagnoses   Final diagnoses:  Laceration of left ring finger with foreign body without damage to nail, initial encounter     Discharge Instructions      Due to the extent of the laceration including x-ray findings of possible metallic foreign bodies, we recommend further evaluation at the emergency department as hand surgery  may need to be consulted for further evaluation.    ED Prescriptions   None    PDMP not reviewed this encounter.   Teresa Almarie LABOR, [place] 03/31/24 1415

## 2024-03-31 NOTE — ED Notes (Signed)
 Patient is being discharged from the Urgent Care and sent to the Emergency Department via Private Vehicle . Per Provider, patient is in need of higher level of care due to Injury/Laceration. Patient is aware and verbalizes understanding of plan of care.  Vitals:   03/31/24 1241  BP: 117/83  Pulse: 85  Resp: 20  Temp: 98 F (36.7 C)  SpO2: 99%

## 2024-03-31 NOTE — ED Triage Notes (Signed)
 BIB family from home for finger injury. Seen at [facility] PTA. Instructed to come to ED. Occurred 1 hour ago. L ring finger cut by metal grate. R hand dominant. Tdap unknown. Finger wrapped/bandaged PTA. Pinpoints pain laceration distal to DIP joint. Rates pain high, 9/10. No meds PTA. Alert, NAD, calm, interactive.

## 2024-03-31 NOTE — Discharge Instructions (Signed)
 Your sutures need to be removed in 7 to 10 days.  You may return to the emergency room, follow-up with your primary care doctor or go to an urgent care.  Leave the dressing that was placed today alone for the day.  Tomorrow, you may take down the dressing gently wash area with soap and water.  Return to the emergency room if you notice pus, or signs of infection around your suture site.  I have sent a prescription for Keflex .  You may take 500 mg 2 times per day for the next 3 days.

## 2024-04-06 ENCOUNTER — Encounter: Payer: Self-pay | Admitting: Emergency Medicine

## 2024-04-06 ENCOUNTER — Ambulatory Visit: Admission: EM | Admit: 2024-04-06 | Discharge: 2024-04-06 | Disposition: A | Discharge status: Home / Self Care

## 2024-04-06 DIAGNOSIS — Z4802 Encounter for removal of sutures: Secondary | ICD-10-CM

## 2024-04-06 NOTE — ED Triage Notes (Signed)
 Pt presents to have sutures removed. There is a total of 10 sutures to be removed. Sutures were placed on L hand 4th finger. The wound is healing well. Pt has no concerns thus far.

## 2024-04-07 ENCOUNTER — Other Ambulatory Visit: Payer: Self-pay | Admitting: Nurse Practitioner

## 2024-04-09 ENCOUNTER — Other Ambulatory Visit: Payer: Self-pay

## 2024-04-09 MED ORDER — LISINOPRIL-HYDROCHLOROTHIAZIDE 20-12.5 MG PO TABS: 1.0000 | ORAL_TABLET | Freq: Every day | ORAL | 1 refills | Status: AC

## 2024-04-09 NOTE — Telephone Encounter (Signed)
 Copied from CRM #[id number]. Topic: Clinical - Medication Question >> Apr 09, 2024  1:19 PM Drema MATSU wrote: Reason for RMF:[id number] called to check on refill request. Advised patient of 3 day timeframe. She has been out of lisinopril -hydrochlorothiazide  (ZESTORETIC ) 20-12.5 MG tablet  for 2 days.

## 2024-04-10 NOTE — Telephone Encounter (Signed)
 Requesting: LISINOPRIL -HCTZ 20-12.5 MG TAB  Last Visit: 12/01/2023 Next Visit: Visit date not found Last Refill: The original prescription was reordered on 04/09/2024 by Nedra Tinnie LABOR, NP. Renewing this prescription may not be appropriate.   Please Advise

## 2024-04-23 ENCOUNTER — Ambulatory Visit: Payer: Self-pay | Admitting: Nurse Practitioner

## 2024-04-23 ENCOUNTER — Encounter: Payer: Self-pay | Admitting: Nurse Practitioner

## 2024-04-23 ENCOUNTER — Ambulatory Visit: Admitting: Nurse Practitioner

## 2024-04-23 VITALS — BP 112/76 | HR 67 | Temp 97.1°F | Ht 63.0 in | Wt 188.0 lb

## 2024-04-23 DIAGNOSIS — M25561 Pain in right knee: Secondary | ICD-10-CM

## 2024-04-23 DIAGNOSIS — R002 Palpitations: Secondary | ICD-10-CM | POA: Insufficient documentation

## 2024-04-23 DIAGNOSIS — S61315D Laceration without foreign body of left ring finger with damage to nail, subsequent encounter: Secondary | ICD-10-CM | POA: Diagnosis not present

## 2024-04-23 DIAGNOSIS — R051 Acute cough: Secondary | ICD-10-CM

## 2024-04-23 DIAGNOSIS — G8929 Other chronic pain: Secondary | ICD-10-CM | POA: Insufficient documentation

## 2024-04-23 DIAGNOSIS — S61315A Laceration without foreign body of left ring finger with damage to nail, initial encounter: Secondary | ICD-10-CM | POA: Insufficient documentation

## 2024-04-23 DIAGNOSIS — G4700 Insomnia, unspecified: Secondary | ICD-10-CM | POA: Diagnosis not present

## 2024-04-23 DIAGNOSIS — M25562 Pain in left knee: Secondary | ICD-10-CM

## 2024-04-23 LAB — CBC WITH DIFFERENTIAL/PLATELET
Basophils Absolute: 0 K/uL (ref 0.0–0.1)
Basophils Relative: 0.4 % (ref 0.0–3.0)
Eosinophils Absolute: 0 K/uL (ref 0.0–0.7)
Eosinophils Relative: 0.8 % (ref 0.0–5.0)
HCT: 40.8 % (ref 36.0–46.0)
Hemoglobin: 13.8 g/dL (ref 12.0–15.0)
Lymphocytes Relative: 38.1 % (ref 12.0–46.0)
Lymphs Abs: 1.5 K/uL (ref 0.7–4.0)
MCHC: 33.8 g/dL (ref 30.0–36.0)
MCV: 92.4 fl (ref 78.0–100.0)
Monocytes Absolute: 0.3 K/uL (ref 0.1–1.0)
Monocytes Relative: 7.7 % (ref 3.0–12.0)
Neutro Abs: 2.1 K/uL (ref 1.4–7.7)
Neutrophils Relative %: 53 % (ref 43.0–77.0)
Platelets: 178 K/uL (ref 150.0–400.0)
RBC: 4.42 Mil/uL (ref 3.87–5.11)
RDW: 13.7 % (ref 11.5–15.5)
WBC: 3.9 K/uL — ABNORMAL LOW (ref 4.0–10.5)

## 2024-04-23 LAB — COMPREHENSIVE METABOLIC PANEL WITH GFR
ALT: 11 U/L (ref 0–35)
AST: 15 U/L (ref 0–37)
Albumin: 4.1 g/dL (ref 3.5–5.2)
Alkaline Phosphatase: 67 U/L (ref 39–117)
BUN: 14 mg/dL (ref 6–23)
CO2: 30 meq/L (ref 19–32)
Calcium: 9.2 mg/dL (ref 8.4–10.5)
Chloride: 105 meq/L (ref 96–112)
Creatinine, Ser: 0.81 mg/dL (ref 0.40–1.20)
GFR: 81.39 mL/min (ref >60.00)
Glucose, Bld: 85 mg/dL (ref 70–99)
Potassium: 3.3 meq/L — ABNORMAL LOW (ref 3.5–5.1)
Sodium: 142 meq/L (ref 135–145)
Total Bilirubin: 0.4 mg/dL (ref 0.2–1.2)
Total Protein: 7.1 g/dL (ref 6.0–8.3)

## 2024-04-23 LAB — T4, FREE: Free T4: 0.77 ng/dL (ref 0.60–1.60)

## 2024-04-23 LAB — TSH: TSH: 7.71 u[IU]/mL — ABNORMAL HIGH (ref 0.35–5.50)

## 2024-04-23 MED ORDER — ALPRAZOLAM 2 MG PO TABS: 2.0000 mg | ORAL_TABLET | Freq: Every day | ORAL | 0 refills | Status: AC

## 2024-04-23 MED ORDER — PROPRANOLOL HCL 10 MG PO TABS: 10.0000 mg | ORAL_TABLET | Freq: Every day | ORAL | 0 refills | Status: DC | PRN

## 2024-04-23 MED ORDER — HYDROCOD POLI-CHLORPHE POLI ER 10-8 MG/5ML PO SUER: 5.0000 mL | Freq: Two times a day (BID) | ORAL | 0 refills | Status: AC | PRN

## 2024-04-23 MED ORDER — ONDANSETRON HCL 4 MG PO TABS: 4.0000 mg | ORAL_TABLET | Freq: Three times a day (TID) | ORAL | 0 refills | Status: AC | PRN

## 2024-04-23 NOTE — Patient Instructions (Addendum)
 It was great to see you!  We are checking your labs today and will let you know the results via mychart/phone.   Keep your finger propped up and use ice   I have placed a referral to ortho for your knees   I have refilled your cough syrup - do not take this with your xanax    Start propranolol daily as needed for heart racing   Let's follow-up in 4 weeks, sooner if you have concerns.  If a referral was placed today, you will be contacted for an appointment. Please note that routine referrals can sometimes take up to 3-4 weeks to process. Please call our office if you haven't heard anything after this time frame.  Take care,  Tinnie Harada, NP

## 2024-04-23 NOTE — Progress Notes (Signed)
 Acute Office Visit  Subjective:     Patient ID: Kaitlyn Keith, female    DOB: [birth year], 58 y.o.   MRN: [id number]  Chief Complaint  Patient presents with   Finger Wound    Left ring finger numbness and wound healing-went to Urgent care on 03/31/24, Rx refil    HPI Discussed the use of AI scribe software for clinical note transcription with the patient, who gave verbal consent to proceed.  History of Present Illness   Kaitlyn Keith is a 58 year old female who presents with a finger injury and numbness following a grill accident. She is accompanied by Prentice, her husband.  She sustained a finger injury when a cast iron grill lid fell on her finger about 3 weeks ago, causing a deep laceration that required ten stitches. The stitches have been removed. She still has persistent numbness with areas of no sensation, a cold feeling in the finger, some swelling, and pain with bending. She denies redness and fevers.   For the past three months she has had nighttime heart palpitations when lying down to sleep. They improve when she takes an extra dose of lisinopril . She has not checked her heart rate during episodes. She denies sweating or shortness of breath. She wonders if her levothyroxine  could be contributing.  She has chronic knee pain described as bone on bone, worsened by her part-time job that involves a lot of movement. A prior steroid injection for foot pain did not help. She is interested in gel injections for her knees to delay total knee replacements. She reports a family pattern of bone problems, including a sister needing hip surgery and an aunt with significant bone disease.  She is having some coughing, that tends to happen in winter months. She denies shortness of breath, wheezing, and congestion.      ROS See pertinent positives and negatives per HPI.     Objective:    BP 112/76 (BP Location: Left Arm, Patient Position: Sitting, Cuff Size: Normal)   Pulse 67    Temp (!) 97.1 F (36.2 C)   Ht 5' 3 (1.6 m)   Wt 188 lb (85.3 kg)   LMP  (LMP Unknown)   SpO2 97%   BMI 33.30 kg/m    Physical Exam Vitals and nursing note reviewed.  Constitutional:      General: She is not in acute distress.    Appearance: Normal appearance.  HENT:     Head: Normocephalic.  Eyes:     Conjunctiva/sclera: Conjunctivae normal.  Cardiovascular:     Rate and Rhythm: Normal rate and regular rhythm.     Pulses: Normal pulses.     Heart sounds: Normal heart sounds.  Pulmonary:     Effort: Pulmonary effort is normal.     Breath sounds: Normal breath sounds.  Musculoskeletal:        General: Swelling (left ring finger) and tenderness (left ring finger) present.     Cervical back: Normal range of motion.     Comments: Laceration healing, no signs of infection. Numbness sensation noted to tip of finger with palpation.   Skin:    General: Skin is warm.  Neurological:     General: No focal deficit present.     Mental Status: She is alert and oriented to person, place, and time.  Psychiatric:        Mood and Affect: Mood normal.        Behavior: Behavior normal.  Thought Content: Thought content normal.        Judgment: Judgment normal.       Assessment & Plan:   Problem List Items Addressed This Visit       Musculoskeletal and Integument   Laceration of left ring finger with damage to nail without foreign body - Primary   Laceration is healing but with swelling and no infection. She is having some numbness that tends to worsen when in cold. Discussed may be some nerve damage that occurred and could be nerves healing causing this sensation. Recommend a referral to hand specialist, however she declines for now. Recommend ice and elevation are advised for swelling.        Other   Insomnia   Chronic, ongoing.  Continue Xanax  2 mg daily at bedtime as needed for sleep.  She declines daily medication and therapy at this point in time.       Relevant  Medications   alprazolam  (XANAX ) 2 MG tablet   Palpitations   She experiences intermittent nocturnal palpitations, possibly related to caffeine and thyroid  issues. EKG shows normal sinus rhythm with a heart rate of 70, no ST or T wave changes. Check CMP, CBC, TSH, free T4 today. Propranolol 10mg  daily prn is prescribed as needed for palpitations. Heart rate monitoring with a smartwatch is advised, and caffeine intake should be reduced to one cup daily.      Relevant Orders   CBC with Differential/Platelet   Comprehensive metabolic panel with GFR   TSH   T4, free   EKG 12-Lead (Completed)   Chronic pain of both knees   Chronic pain worsens with activity, and a previous steroid injection was ineffective. She is interested in gel injections. Referral placed to ortho.       Relevant Orders   Ambulatory referral to Orthopedic Surgery   Other Visit Diagnoses       Acute cough       Encourage fluids. Start tussionex at bedtime as needed for cough. PDMP reviewed. Do not take with xanax .       Meds ordered this encounter  Medications   alprazolam  (XANAX ) 2 MG tablet    Sig: Take 1 tablet (2 mg total) by mouth at bedtime.    Dispense:  30 tablet    Refill:  0    This request is for a new prescription for a controlled substance as required by Federal/State law.   ondansetron  (ZOFRAN ) 4 MG tablet    Sig: Take 1 tablet (4 mg total) by mouth every 8 (eight) hours as needed.    Dispense:  30 tablet    Refill:  0   chlorpheniramine-HYDROcodone  (TUSSIONEX) 10-8 MG/5ML    Sig: Take 5 mLs by mouth every 12 (twelve) hours as needed.    Dispense:  70 mL    Refill:  0   propranolol (INDERAL) 10 MG tablet    Sig: Take 1 tablet (10 mg total) by mouth daily as needed (palpitations).    Dispense:  30 tablet    Refill:  0    Return in about 4 weeks (around 05/21/2024) for palpitations .  Tinnie DELENA Harada, NP

## 2024-04-23 NOTE — Assessment & Plan Note (Signed)
 Chronic pain worsens with activity, and a previous steroid injection was ineffective. She is interested in gel injections. Referral placed to ortho.

## 2024-04-23 NOTE — Assessment & Plan Note (Signed)
 Laceration is healing but with swelling and no infection. She is having some numbness that tends to worsen when in cold. Discussed may be some nerve damage that occurred and could be nerves healing causing this sensation. Recommend a referral to hand specialist, however she declines for now. Recommend ice and elevation are advised for swelling.

## 2024-04-23 NOTE — Assessment & Plan Note (Signed)
 She experiences intermittent nocturnal palpitations, possibly related to caffeine and thyroid  issues. EKG shows normal sinus rhythm with a heart rate of 70, no ST or T wave changes. Check CMP, CBC, TSH, free T4 today. Propranolol  10mg  daily prn is prescribed as needed for palpitations. Heart rate monitoring with a smartwatch is advised, and caffeine intake should be reduced to one cup daily.

## 2024-04-23 NOTE — Assessment & Plan Note (Signed)
 Chronic, ongoing.  Continue Xanax  2 mg daily at bedtime as needed for sleep.  She declines daily medication and therapy at this point in time.

## 2024-04-23 NOTE — Progress Notes (Signed)
 EKG interpreted by me on 04/23/24 showed normal sinus rhythm with a heart rate of 70. No ST or T wave changes

## 2024-04-24 MED ORDER — LEVOTHYROXINE SODIUM 112 MCG PO TABS: 112.0000 ug | ORAL_TABLET | Freq: Every day | ORAL | 0 refills | Status: AC

## 2024-04-27 ENCOUNTER — Telehealth: Payer: Self-pay | Admitting: Podiatry

## 2024-04-27 NOTE — Telephone Encounter (Signed)
 Patient called in wanting to know if it's too soon to get another steroid injection.

## 2024-05-15 ENCOUNTER — Other Ambulatory Visit (INDEPENDENT_AMBULATORY_CARE_PROVIDER_SITE_OTHER): Payer: Self-pay

## 2024-05-15 ENCOUNTER — Ambulatory Visit: Admitting: Orthopaedic Surgery

## 2024-05-15 ENCOUNTER — Other Ambulatory Visit: Payer: Self-pay | Admitting: Nurse Practitioner

## 2024-05-15 DIAGNOSIS — M25562 Pain in left knee: Secondary | ICD-10-CM | POA: Diagnosis not present

## 2024-05-15 DIAGNOSIS — G8929 Other chronic pain: Secondary | ICD-10-CM

## 2024-05-15 DIAGNOSIS — M170 Bilateral primary osteoarthritis of knee: Secondary | ICD-10-CM | POA: Diagnosis not present

## 2024-05-15 DIAGNOSIS — M25561 Pain in right knee: Secondary | ICD-10-CM

## 2024-05-15 MED ORDER — METHYLPREDNISOLONE ACETATE 40 MG/ML IJ SUSP
40.0000 mg | INTRAMUSCULAR | Status: AC | PRN
  Administered 2024-05-15: 40 mg via INTRA_ARTICULAR

## 2024-05-15 MED ORDER — LIDOCAINE HCL 1 % IJ SOLN
2.0000 mL | INTRAMUSCULAR | Status: AC | PRN
  Administered 2024-05-15: 2 mL

## 2024-05-15 MED ORDER — BUPIVACAINE HCL 0.5 % IJ SOLN
2.0000 mL | INTRAMUSCULAR | Status: AC | PRN
  Administered 2024-05-15: 2 mL via INTRA_ARTICULAR

## 2024-05-15 NOTE — Telephone Encounter (Signed)
 Requesting: PROPRANOLOL  10 MG TABLET  Last Visit: 04/23/2024 Next Visit: Visit date not found Last Refill: 04/23/2024 Due for a 4 week follow up  Please Advise    Pharmacy comment: REQUEST FOR 90 DAYS PRESCRIPTION.

## 2024-05-15 NOTE — Progress Notes (Signed)
 "  Office Visit Note   Patient: Kaitlyn Keith           Date of Birth: 07-29-1965           MRN: [id number] Visit Date: 05/15/2024              Requested by: Nedra Tinnie LABOR, NP [address] [place],  [place] [address] PCP: Nedra Tinnie LABOR, NP   Assessment & Plan: Visit Diagnoses:  1. Chronic pain of both knees     Plan: History of Present Illness Kaitlyn Keith is a 58 year old female with bilateral knee osteoarthritis who presents for evaluation of chronic bilateral knee pain, worse on the right.  She has had constant bilateral knee pain for several years, right worse than left. Pain is activity related and associated with swelling, locking, catching, and episodes of instability in both knees.  She notes intermittent effusions, more prominent in the right knee, and joint line tenderness. She had a slip and fall over the summer, though her knee pain started before this.  She has taken anti-inflammatory medications chronically with persistent symptoms. She has not had prior knee surgery, viscosupplementation, or recent orthopedic evaluation.  There is a family history of arthritis.  Physical Exam MUSCULOSKELETAL: Crepitus with movement, pain along the joint line, and mild effusion in the knee.  Results Radiology Bilateral knee X-ray: Degenerative changes with near bone-on-bone contact in the patellofemoral compartment, large osteophyte at the inferior pole of the right patella, near bone-on-bone articulation between femur and tibia without complete loss of joint space. (Independently interpreted)  Assessment and Plan Bilateral knee osteoarthritis Chronic, progressive bilateral knee osteoarthritis with significant degenerative changes, more severe in the right knee. Intra-articular injections provide symptomatic relief. Total knee arthroplasty is definitive if symptoms become intolerable. - Administered bilateral intra-articular corticosteroid injections. -  Initiated therapist, occupational for bilateral viscosupplementation. - Educated on osteoarthritis progression and symptomatic role of injections. - Discussed total knee arthroplasty, including risks of simultaneous versus staged procedures. - Provided educational handout on knee arthroplasty and recovery.  This patient is diagnosed with osteoarthritis of the knee(s).    Radiographs show evidence of joint space narrowing, osteophytes, subchondral sclerosis and/or subchondral cysts.  This patient has knee pain which interferes with functional and activities of daily living.    This patient has experienced inadequate response, adverse effects and/or intolerance with conservative treatments such as acetaminophen , NSAIDS, topical creams, physical therapy or regular exercise, knee bracing and/or weight loss.   This patient has experienced inadequate response or has a contraindication to intra articular steroid injections for at least 3 months.   This patient is not scheduled to have a total knee replacement within 6 months of starting treatment with viscosupplementation.  Follow-Up Instructions: No follow-ups on file.   Orders:  Orders Placed This Encounter  Procedures   XR KNEE 3 VIEW RIGHT   XR KNEE 3 VIEW LEFT   No orders of the defined types were placed in this encounter.     Procedures: Large Joint Inj: bilateral knee on 05/15/2024 5:14 PM Indications: pain Details: 22 G needle  Arthrogram: No  Medications (Right): 2 mL lidocaine  1 %; 2 mL bupivacaine  0.5 %; 40 mg methylPREDNISolone  acetate 40 MG/ML Medications (Left): 2 mL lidocaine  1 %; 2 mL bupivacaine  0.5 %; 40 mg methylPREDNISolone  acetate 40 MG/ML Outcome: tolerated well, no immediate complications Patient was prepped and draped in the usual sterile fashion.       Clinical Data: No additional findings.  Subjective: Chief Complaint  Patient presents with   Right Knee - Pain   Left Knee - Pain    HPI  Review of  Systems  Constitutional: Negative.   HENT: Negative.    Eyes: Negative.   Respiratory: Negative.    Cardiovascular: Negative.   Endocrine: Negative.   Musculoskeletal: Negative.   Neurological: Negative.   Hematological: Negative.   Psychiatric/Behavioral: Negative.    All other systems reviewed and are negative.    Objective: Vital Signs: There were no vitals taken for this visit.  Physical Exam Vitals and nursing note reviewed.  Constitutional:      Appearance: She is well-developed.  HENT:     Head: Atraumatic.     Nose: Nose normal.  Eyes:     Extraocular Movements: Extraocular movements intact.  Cardiovascular:     Pulses: Normal pulses.  Pulmonary:     Effort: Pulmonary effort is normal.  Abdominal:     Palpations: Abdomen is soft.  Musculoskeletal:     Cervical back: Neck supple.  Skin:    General: Skin is warm.     Capillary Refill: Capillary refill takes less than 2 seconds.  Neurological:     Mental Status: She is alert. Mental status is at baseline.  Psychiatric:        Behavior: Behavior normal.        Thought Content: Thought content normal.        Judgment: Judgment normal.     Ortho Exam  Specialty Comments:  No specialty comments available.  Imaging: No results found.   PMFS History: Patient Active Problem List   Diagnosis Date Noted   Palpitations 04/23/2024   Laceration of left ring finger with damage to nail without foreign body 04/23/2024   Chronic pain of both knees 04/23/2024   Chronic idiopathic constipation 12/01/2023   H. pylori infection 12/01/2023   Dyspepsia 10/19/2023   Pure hypercholesterolemia 04/11/2023   Gastroenteritis 04/11/2023   Post-nasal drip 04/11/2023   Rash 10/27/2022   Insomnia 09/04/2021   Obesity (BMI 30-39.9) 09/04/2021   Vitamin D  deficiency 09/04/2021   Hypothyroidism 09/04/2021   History of breast cancer 09/04/2021   Arthritis of finger of left hand 03/27/2021   Pain of left hand 03/27/2021    Metastasis to skin (HCC) 07/05/2017   Goals of care, counseling/discussion 07/04/2017   Genetic testing 12/10/2016   MSH6-related Lynch syndrome (HNPCC5) 12/10/2016   Cerebellar mass 07/27/2016   Cerebellar tumor (HCC) 07/27/2016   C2 cervical fracture (HCC) 07/12/2016   Hypertension 07/12/2016   Degenerative spinal arthritis 11/18/2015   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 Marylen underwent genetic counseling and testing for hereditary cancer syndromes on 11/18/2016. 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 Mutations in MSH6 are associated with a hereditary cancer syndrome called Lynch syndrome. For more detailed discussion, please see genetic counseling  documentation from 12/10/2016.  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 Marylen underwent genetic counseling and testing for hereditary cancer syndromes on 11/18/2016. 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 Mutations in MSH6 are associated with a hereditary cancer syndrome called Lynch syndrome. For more detailed discussion, please see genetic counseling documentation from 12/10/2016.  Testing was perfo   Radiation 07/23/15-09/05/15   right breast 50.4 Gy,  boost of 10 Gy    Family History  Problem Relation Age of Onset   Aneurysm Mother 102       d.55   Heart attack Father 69       d.62   Endometrial cancer Sister 89   Lung cancer Maternal Uncle        d.68s   Cancer Maternal Grandmother        unspecified type-possibly stomach d.88s    Past Surgical History:  Procedure Laterality Date   APPLICATION OF CRANIAL NAVIGATION Right 07/27/2016   Procedure: APPLICATION OF CRANIAL NAVIGATION;  Surgeon: Morene Hicks Ditty, MD;  Location: [facility] OR;  Service: Neurosurgery;  Laterality: Right;   BIOPSY OF SKIN SUBCUTANEOUS TISSUE AND/OR MUCOUS MEMBRANE Left 12/24/2016   Procedure: OPEN BIOPSY LESIONS ON LEFT LOWER BACK AND SHOULDER BLADE;  Surgeon: Curvin Deward MOULD, MD;  Location: [facility] OR;  Service: General;  Laterality: Left;   BREAST LUMPECTOMY WITH NEEDLE LOCALIZATION AND AXILLARY SENTINEL LYMPH NODE BX Right 06/16/2015   Procedure: BREAST LUMPECTOMY WITH NEEDLE LOCALIZATION AND AXILLARY SENTINEL LYMPH NODE BX;  Surgeon: Deward Curvin III, MD;  Location: [facility];  Service: General;  Laterality: Right;   BREAST REDUCTION SURGERY     CRANIOTOMY Right 07/27/2016   Procedure: Right Suboccipital craniotomy for tumor resection with stereotactic navigation;  Surgeon: Morene Hicks Ditty, MD;  Location: [facility];  Service: Neurosurgery;  Laterality: Right;   PORT-A-CATH REMOVAL N/A 06/16/2015   Procedure: REMOVAL PORT-A-CATH;  Surgeon: Deward Curvin III, MD;  Location: [facility];  Service: General;  Laterality: N/A;   PORT-A-CATH REMOVAL Left 04/21/2020   Procedure: REMOVAL PORT-A-CATH;  Surgeon: Curvin Deward MOULD, MD;  Location: [facility];  Service: General;  Laterality: Left;   PORTACATH PLACEMENT N/A 12/23/2014   Procedure: INSERTION PORT-A-CATH;  Surgeon: Deward Curvin III, MD;  Location: [facility];  Service: General;  Laterality: N/A;   PORTACATH PLACEMENT Left 07/08/2017   Procedure: INSERTION PORT-A-CATH;  Surgeon:  Curvin Deward MOULD, MD;  Location: [facility];  Service: General;  Laterality: Left;   Social History   Occupational History   Not on file  Tobacco Use   Smoking status: Never    Passive exposure: Never   Smokeless tobacco: Never  Vaping Use   Vaping status: Never Used  Substance and Sexual Activity   Alcohol use: Yes    Comment: social   Drug use: No   Sexual activity: Yes    Birth control/protection: None        "

## 2024-06-09 ENCOUNTER — Ambulatory Visit: Payer: Self-pay

## 2024-06-21 ENCOUNTER — Ambulatory Visit: Admitting: Podiatry

## 2024-09-15 ENCOUNTER — Other Ambulatory Visit

## 2024-09-24 ENCOUNTER — Inpatient Hospital Stay

## 2024-09-26 ENCOUNTER — Ambulatory Visit: Admitting: Urology
